# Patient Record
Sex: Male | Born: 1935 | Race: White | Hispanic: No | Smoking: Former smoker
Health system: Southern US, Community
[De-identification: ages and names within clinical notes are randomized; demographics above are authoritative.]

## PROBLEM LIST (undated history)

## (undated) DIAGNOSIS — D494 Neoplasm of unspecified behavior of bladder: Secondary | ICD-10-CM

## (undated) DIAGNOSIS — E785 Hyperlipidemia, unspecified: Secondary | ICD-10-CM

## (undated) DIAGNOSIS — Z8546 Personal history of malignant neoplasm of prostate: Secondary | ICD-10-CM

## (undated) DIAGNOSIS — R600 Localized edema: Secondary | ICD-10-CM

## (undated) DIAGNOSIS — Z860101 Personal history of adenomatous and serrated colon polyps: Secondary | ICD-10-CM

## (undated) DIAGNOSIS — Z86010 Personal history of colonic polyps: Secondary | ICD-10-CM

## (undated) DIAGNOSIS — N529 Male erectile dysfunction, unspecified: Secondary | ICD-10-CM

## (undated) DIAGNOSIS — R0600 Dyspnea, unspecified: Secondary | ICD-10-CM

## (undated) DIAGNOSIS — H3530 Unspecified macular degeneration: Secondary | ICD-10-CM

## (undated) DIAGNOSIS — R569 Unspecified convulsions: Secondary | ICD-10-CM

## (undated) DIAGNOSIS — Q2730 Arteriovenous malformation, site unspecified: Secondary | ICD-10-CM

## (undated) DIAGNOSIS — J439 Emphysema, unspecified: Secondary | ICD-10-CM

## (undated) DIAGNOSIS — I252 Old myocardial infarction: Secondary | ICD-10-CM

## (undated) DIAGNOSIS — Z955 Presence of coronary angioplasty implant and graft: Secondary | ICD-10-CM

## (undated) DIAGNOSIS — E559 Vitamin D deficiency, unspecified: Secondary | ICD-10-CM

## (undated) DIAGNOSIS — R0609 Other forms of dyspnea: Secondary | ICD-10-CM

## (undated) DIAGNOSIS — I10 Essential (primary) hypertension: Secondary | ICD-10-CM

## (undated) DIAGNOSIS — I440 Atrioventricular block, first degree: Secondary | ICD-10-CM

## (undated) DIAGNOSIS — K219 Gastro-esophageal reflux disease without esophagitis: Secondary | ICD-10-CM

## (undated) DIAGNOSIS — Z8551 Personal history of malignant neoplasm of bladder: Secondary | ICD-10-CM

## (undated) DIAGNOSIS — M1990 Unspecified osteoarthritis, unspecified site: Secondary | ICD-10-CM

## (undated) DIAGNOSIS — I2510 Atherosclerotic heart disease of native coronary artery without angina pectoris: Secondary | ICD-10-CM

## (undated) DIAGNOSIS — I441 Atrioventricular block, second degree: Secondary | ICD-10-CM

## (undated) DIAGNOSIS — Z950 Presence of cardiac pacemaker: Secondary | ICD-10-CM

## (undated) HISTORY — PX: CATARACT EXTRACTION W/ INTRAOCULAR LENS  IMPLANT, BILATERAL: SHX1307

## (undated) HISTORY — DX: Essential (primary) hypertension: I10

## (undated) HISTORY — DX: Arteriovenous malformation, site unspecified: Q27.30

## (undated) HISTORY — PX: BLEPHAROPLASTY: SUR158

## (undated) HISTORY — DX: Unspecified macular degeneration: H35.30

## (undated) HISTORY — DX: Unspecified convulsions: R56.9

## (undated) HISTORY — DX: Gastro-esophageal reflux disease without esophagitis: K21.9

## (undated) HISTORY — DX: Male erectile dysfunction, unspecified: N52.9

---

## 1978-09-19 HISTORY — PX: CRANIOTOMY: SHX93

## 1994-04-27 ENCOUNTER — Encounter: Payer: Self-pay | Admitting: Internal Medicine

## 1999-03-12 ENCOUNTER — Ambulatory Visit (HOSPITAL_COMMUNITY): Admission: RE | Admit: 1999-03-12 | Discharge: 1999-03-12 | Payer: Self-pay | Admitting: Gastroenterology

## 1999-03-12 ENCOUNTER — Encounter: Payer: Self-pay | Admitting: Gastroenterology

## 2003-07-22 ENCOUNTER — Encounter: Payer: Self-pay | Admitting: Internal Medicine

## 2003-07-22 ENCOUNTER — Ambulatory Visit (HOSPITAL_COMMUNITY): Admission: RE | Admit: 2003-07-22 | Discharge: 2003-07-22 | Payer: Self-pay | Admitting: Gastroenterology

## 2004-04-17 ENCOUNTER — Emergency Department (HOSPITAL_COMMUNITY): Admission: EM | Admit: 2004-04-17 | Discharge: 2004-04-17 | Payer: Self-pay | Admitting: Emergency Medicine

## 2007-01-01 ENCOUNTER — Ambulatory Visit: Payer: Self-pay | Admitting: Gastroenterology

## 2008-08-22 ENCOUNTER — Ambulatory Visit: Payer: Self-pay | Admitting: Internal Medicine

## 2008-08-22 DIAGNOSIS — Z860100 Personal history of colon polyps, unspecified: Secondary | ICD-10-CM | POA: Insufficient documentation

## 2008-08-22 DIAGNOSIS — K219 Gastro-esophageal reflux disease without esophagitis: Secondary | ICD-10-CM

## 2008-09-22 ENCOUNTER — Ambulatory Visit: Payer: Self-pay | Admitting: Internal Medicine

## 2009-09-19 HISTORY — PX: APPENDECTOMY: SHX54

## 2009-09-24 ENCOUNTER — Inpatient Hospital Stay (HOSPITAL_COMMUNITY): Admission: EM | Admit: 2009-09-24 | Discharge: 2009-09-25 | Payer: Self-pay | Admitting: Emergency Medicine

## 2009-09-24 ENCOUNTER — Encounter (INDEPENDENT_AMBULATORY_CARE_PROVIDER_SITE_OTHER): Payer: Self-pay | Admitting: General Surgery

## 2010-12-05 LAB — CBC
HCT: 44.5 % (ref 39.0–52.0)
Hemoglobin: 14.9 g/dL (ref 13.0–17.0)
RDW: 13.5 % (ref 11.5–15.5)

## 2010-12-05 LAB — DIFFERENTIAL
Basophils Absolute: 0 10*3/uL (ref 0.0–0.1)
Eosinophils Relative: 0 % (ref 0–5)
Lymphocytes Relative: 6 % — ABNORMAL LOW (ref 12–46)
Lymphs Abs: 0.8 10*3/uL (ref 0.7–4.0)
Monocytes Absolute: 0.7 10*3/uL (ref 0.1–1.0)

## 2010-12-05 LAB — BASIC METABOLIC PANEL
Chloride: 97 mEq/L (ref 96–112)
GFR calc non Af Amer: >60 mL/min (ref >60)
Glucose, Bld: 167 mg/dL — ABNORMAL HIGH (ref 70–99)
Potassium: 3.7 mEq/L (ref 3.5–5.1)
Sodium: 132 mEq/L — ABNORMAL LOW (ref 135–145)

## 2010-12-05 LAB — URINALYSIS, ROUTINE W REFLEX MICROSCOPIC
Glucose, UA: NEGATIVE mg/dL
Ketones, ur: NEGATIVE mg/dL
Leukocytes, UA: NEGATIVE
Nitrite: NEGATIVE
Protein, ur: 30 mg/dL — AB
Urobilinogen, UA: 0.2 mg/dL (ref 0.0–1.0)

## 2010-12-05 LAB — URINE MICROSCOPIC-ADD ON

## 2011-02-04 NOTE — Assessment & Plan Note (Signed)
[facility]                         GASTROENTEROLOGY OFFICE NOTE   FLEETWOOD, PIERRON                    MRN:          [id number]  DATE:01/01/2007                            DOB:          04-23-1932    Joshua [name] comes in because he is somewhat concerned about his GERD and  whether he needs a followup colonoscopic examination. He says he has a  little difficulty with dysphagia but nothing of great significance only  minimally with some large calcium pills, most consistent with  cricopharyngeal dysfunction. His last colonoscopic examination revealed  a 2-cm hiatal hernia, he was dilated with an 18-Savary dilator and he  had some mild gastritis. Colonoscopic examination done in 2004 as well  revealed only some mild diverticular disease and external hemorrhoids.  The patient presently is on dilantin, phenobarbital, Omeprazole, Plavix,  pravastatin, calcium and vitamin D.   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 He also had some sinus trouble.   FAMILY HISTORY:  Noncontributory except for a brother with diabetes.   SOCIAL HISTORY:  He drinks rare occasionally, nothing of significant.   REVIEW OF SYSTEMS:  Noncontributory.   PHYSICAL EXAMINATION:  GENERAL:  A healthy-appearing man, 6 feet 1,  weighs 203, blood pressure 122/64, pulse 78 and regular.  Neck, heart and extremities are all unremarkable.   IMPRESSION:  1. Gastroesophageal reflux disease with some mild dysphagia and some      cricopharyngeal dysphagia of probably no consequences and not      unusual for his age which I explained to him.  2. Status post colon polyps.  3. History of cranial arteriovenous malformations, TIAs.   RECOMMENDATIONS:  He is to stay on his Prilosec and I told him I really  did not think he needed an upper endoscopy since his dysphagia  was not  significant and I also did not think with his fairly recent colonoscopic  examination that he was not scheduled for another year and a half  and I really did not think he ought to do it any sooner unless there is  worsening symptomatology of more evidence of __________ .     Ulyess Mort, MD  Electronically Signed    SML/MedQ  DD: 01/01/2007  DT: 01/01/2007  Job #: [id number]   cc:   Ernestina Penna, M.D.

## 2011-04-21 ENCOUNTER — Other Ambulatory Visit: Payer: Self-pay | Admitting: Family Medicine

## 2011-04-21 DIAGNOSIS — R918 Other nonspecific abnormal finding of lung field: Secondary | ICD-10-CM

## 2011-04-22 ENCOUNTER — Ambulatory Visit
Admission: RE | Admit: 2011-04-22 | Discharge: 2011-04-22 | Disposition: A | Discharge status: Home / Self Care | Payer: Medicare Other | Source: Ambulatory Visit | Attending: Family Medicine | Admitting: Family Medicine

## 2011-04-22 DIAGNOSIS — R918 Other nonspecific abnormal finding of lung field: Secondary | ICD-10-CM

## 2011-04-22 MED ORDER — IOHEXOL 300 MG/ML  SOLN
75.0000 mL | Freq: Once | INTRAMUSCULAR | Status: AC | PRN
  Administered 2011-04-22: 75 mL via INTRAVENOUS

## 2011-04-28 ENCOUNTER — Ambulatory Visit
Admission: RE | Admit: 2011-04-28 | Discharge: 2011-04-28 | Disposition: A | Discharge status: Home / Self Care | Payer: Medicare Other | Source: Ambulatory Visit | Attending: Family Medicine | Admitting: Family Medicine

## 2011-04-28 ENCOUNTER — Other Ambulatory Visit: Payer: Self-pay | Admitting: Family Medicine

## 2011-04-28 DIAGNOSIS — K7689 Other specified diseases of liver: Secondary | ICD-10-CM

## 2011-04-28 MED ORDER — GADOBENATE DIMEGLUMINE 529 MG/ML IV SOLN: 18.0000 mL | Freq: Once | INTRAVENOUS | Status: AC | PRN

## 2011-09-27 DIAGNOSIS — R3915 Urgency of urination: Secondary | ICD-10-CM | POA: Diagnosis not present

## 2011-09-27 DIAGNOSIS — N138 Other obstructive and reflux uropathy: Secondary | ICD-10-CM | POA: Diagnosis not present

## 2011-10-21 DIAGNOSIS — R7989 Other specified abnormal findings of blood chemistry: Secondary | ICD-10-CM | POA: Diagnosis not present

## 2011-11-02 DIAGNOSIS — R339 Retention of urine, unspecified: Secondary | ICD-10-CM | POA: Diagnosis not present

## 2011-11-02 DIAGNOSIS — N138 Other obstructive and reflux uropathy: Secondary | ICD-10-CM | POA: Diagnosis not present

## 2011-11-02 DIAGNOSIS — N403 Nodular prostate with lower urinary tract symptoms: Secondary | ICD-10-CM | POA: Diagnosis not present

## 2011-11-03 DIAGNOSIS — R972 Elevated prostate specific antigen [PSA]: Secondary | ICD-10-CM | POA: Diagnosis not present

## 2011-11-17 DIAGNOSIS — R3915 Urgency of urination: Secondary | ICD-10-CM | POA: Diagnosis not present

## 2011-11-17 DIAGNOSIS — N138 Other obstructive and reflux uropathy: Secondary | ICD-10-CM | POA: Diagnosis not present

## 2011-11-17 DIAGNOSIS — R339 Retention of urine, unspecified: Secondary | ICD-10-CM | POA: Diagnosis not present

## 2011-11-17 DIAGNOSIS — C61 Malignant neoplasm of prostate: Secondary | ICD-10-CM | POA: Diagnosis not present

## 2011-11-17 DIAGNOSIS — N403 Nodular prostate with lower urinary tract symptoms: Secondary | ICD-10-CM | POA: Diagnosis not present

## 2011-11-22 ENCOUNTER — Encounter: Payer: Self-pay | Admitting: *Deleted

## 2011-11-22 DIAGNOSIS — R569 Unspecified convulsions: Secondary | ICD-10-CM | POA: Insufficient documentation

## 2011-11-22 DIAGNOSIS — E7800 Pure hypercholesterolemia, unspecified: Secondary | ICD-10-CM | POA: Insufficient documentation

## 2011-11-22 DIAGNOSIS — I10 Essential (primary) hypertension: Secondary | ICD-10-CM | POA: Insufficient documentation

## 2011-11-22 DIAGNOSIS — C61 Malignant neoplasm of prostate: Secondary | ICD-10-CM | POA: Insufficient documentation

## 2011-11-22 NOTE — Progress Notes (Signed)
Path:11/02/2011 : Prostate bxs=Adenocarcinoma,Gleason =3+3=6 & 3+4=7,PSA=1.58.Volume=19cc   Widowed,2 children, [employer]   Allergies: PCN    Hx: Incomplete emptying of Bladder, and Urgency Interested in [employer] implant

## 2011-11-23 ENCOUNTER — Ambulatory Visit
Admission: RE | Admit: 2011-11-23 | Discharge: 2011-11-23 | Disposition: A | Discharge status: Home / Self Care | Payer: Medicare Other | Source: Ambulatory Visit | Attending: Radiation Oncology | Admitting: Radiation Oncology

## 2011-11-23 ENCOUNTER — Encounter: Payer: Self-pay | Admitting: Radiation Oncology

## 2011-11-23 VITALS — BP 149/74 | HR 71 | Temp 98.1°F | Resp 20 | Ht 72.0 in | Wt 194.3 lb

## 2011-11-23 DIAGNOSIS — C61 Malignant neoplasm of prostate: Secondary | ICD-10-CM

## 2011-11-23 DIAGNOSIS — Z79899 Other long term (current) drug therapy: Secondary | ICD-10-CM | POA: Diagnosis not present

## 2011-11-23 DIAGNOSIS — Z87891 Personal history of nicotine dependence: Secondary | ICD-10-CM | POA: Diagnosis not present

## 2011-11-23 DIAGNOSIS — I10 Essential (primary) hypertension: Secondary | ICD-10-CM | POA: Insufficient documentation

## 2011-11-23 DIAGNOSIS — E7800 Pure hypercholesterolemia, unspecified: Secondary | ICD-10-CM | POA: Insufficient documentation

## 2011-11-23 DIAGNOSIS — N529 Male erectile dysfunction, unspecified: Secondary | ICD-10-CM | POA: Insufficient documentation

## 2011-11-23 DIAGNOSIS — K219 Gastro-esophageal reflux disease without esophagitis: Secondary | ICD-10-CM | POA: Diagnosis not present

## 2011-11-23 NOTE — Progress Notes (Signed)
[facility] Radiation Oncology Review Of Systems  Name: Joshua [name] MRN: [id number]  Date: 11/23/2011  DOB: 25-Feb-1932  Status:outpatient   Drug Allergies:  Allergies  Allergen Reactions  . Penicillins     Symptoms since last visit at this office:  CONSTITUTIONAL: None  EYES:Glasses  EARS/NOSE/THROAT: Hearing Loss  HEART:None  LUNG: None  STOMACH/BOWEL: None  GENITOURINARY: None  BREASTS: None  SKIN: Dryness  MUSCLE/BONES: Back Pain  NERVOUS SYSTEM:Vertigo and seizure   MENTAL HEALTH:None  HORMONES/REPRODUCTIVE: None  BLOOD/LYMPH SYSTEM: None  IMMUNE: None  ADDITIONAL INFORMATION/CONCERNS: [facility] Radiation Oncology Review Of Systems  Name: Joshua [name] MRN: [id number]  Date: 11/23/2011  DOB: [birth year]  Status:outpatient   Drug Allergies:  Allergies  Allergen Reactions  . Penicillins     Symptoms since last visit at this office:  CONSTITUTIONAL: None  EYES:Glasses  EARS/NOSE/THROAT: Hearing Loss  HEART:None  LUNG: None  STOMACH/BOWEL: None and Reflux  GENITOURINARY: None  BREASTS: None  SKIN: None  MUSCLE/BONES: Back Pain  NERVOUS SYSTEM:Vertigo,History  MENTAL HEALTH:None  HORMONES/REPRODUCTIVE: None  BLOOD/LYMPH SYSTEM: None  IMMUNE: None  ADDITIONAL INFORMATION/CONCERNS: [facility] Radiation Oncology Review Of Systems  Name: Joshua [name] MRN: [id number]  Date: 11/23/2011  DOB: 28-Jul-1932  Status:outpatient   Drug Allergies:  Allergies  Allergen Reactions  . Penicillins     Symptoms since last visit at this office:  CONSTITUTIONAL: None  EYES:Glasses  EARS/NOSE/THROAT: Hearing Loss,  HEART:None and Feet Swelling  LUNG: None and pneumonia this summer  STOMACH/BOWEL: Reflux  GENITOURINARY: None and bladder doesn't completely empties  BREASTS: None  SKIN: None and Dryness  MUSCLE/BONES: None and Back Pain  NERVOUS  SYSTEM:Seizure  MENTAL HEALTH:None  HORMONES/REPRODUCTIVE: None  BLOOD/LYMPH SYSTEM: None  IMMUNE: None  ADDITIONAL INFORMATION/CONCERNS:Please see the Nurse Progress Note in the MD Initial Consult Encounter for this patient.

## 2011-11-23 NOTE — Progress Notes (Signed)
Radiation Oncology         [phone]) [phone] ________________________________  Initial outpatient Consultation  Name: Joshua [name] MRN: [id number]  Date: 11/23/2011  DOB: June 27, 1932  CC:No primary provider on file.  [name], MD   REFERRING PHYSICIAN: [name], MD  DIAGNOSIS: 78 yo gentleman with stage T2a adenocarcinoma of the prostate with a Gleason's score of 3+4 and a PSA of 1.58  HISTORY OF PRESENT ILLNESS::Joshua [name] is a 78 y.o. male who was found to have a palpable nodule of the left prostate by his primary care doctor.  His PSA was normal 1.58.  He was referred to Dr. Annabell Howells on 09/27/11.  Dr. Annabell Howells performed a digital rectal exam at that time revealed an 8 mm nodule in the prostate. The patient underwent transrectal ultrasound with 12 biopsies on 2/13.  His volume was 19 cc.  Adenocarcinoma was seen in 3/12 core samples with a maximum Gleason's score of 3+4 occupying 5% of the left lateral base and left base specimens.  Gleason's 3+3 was seen in 5% of the right mid specimen.  He reviewed the biopsy results with Dr. Annabell Howells and has kindly been referred to discuss possible radiation options.  PREVIOUS RADIATION THERAPY: No  PAST MEDICAL HISTORY:  has a past medical history of Prostate cancer (11/02/11 bxs); Allergy; GERD (gastroesophageal reflux disease); Hypercholesterolemia; Hypertension; Seizures; Arteriovenous malformation; and ED (erectile dysfunction).    PAST SURGICAL HISTORY: Past Surgical History  Procedure Date  . Appendectomy   . Craniotomy     FAMILY HISTORY: family history includes Emphysema in his father and mother.  SOCIAL HISTORY:  reports that he has quit smoking. He does not have any smokeless tobacco history on file. He reports that he drinks alcohol. He reports that he does not use illicit drugs.  ALLERGIES: Penicillins  MEDICATIONS:  Current Outpatient Prescriptions  Medication Sig Dispense Refill  . Calcium 500 MG CHEW Chew 500 mg by mouth  daily.      . clopidogrel (PLAVIX) 75 MG tablet Take 75 mg by mouth daily.      Marland Kitchen omeprazole (PRILOSEC) 20 MG capsule Take 20 mg by mouth daily.      Marland Kitchen PHENobarbital (LUMINAL) 32.4 MG tablet Take 32.4 mg by mouth 2 (two) times daily.      . phenytoin (DILANTIN) 100 MG ER capsule Take by mouth 3 (three) times daily.      . pravastatin (PRAVACHOL) 80 MG tablet Take 80 mg by mouth daily.      Marland Kitchen VITAMIN D, ERGOCALCIFEROL, PO Take by mouth.        REVIEW OF SYSTEMS:  A 15 point review of systems is documented in the electronic medical record. This was obtained by the nursing staff. However, I reviewed this with the patient to discuss relevant findings and make appropriate changes.  Pertinent items are noted in HPI.  He filled out an IPSS questionaire today His score was 7 suggesting mild outflow obstructive symptoms.  He filled out an erectile function questionairre today, but indicated that he has not been sexually active since the passing of his wife, and this is not a priority for him.   PHYSICAL EXAM:  vitals were not taken for this visit.  The patient is in no acute distress.  Detailed exam was deferred today.  Please see the rectal exam findings described above.  LABORATORY DATA:  Lab Results  Component Value Date   WBC 13.0* 09/23/2009   HGB 14.9 09/23/2009  HCT 44.5 09/23/2009   MCV 88.3 09/23/2009   PLT 272 09/23/2009   Lab Results  Component Value Date   NA 132* 09/23/2009   K 3.7 09/23/2009   CL 97 09/23/2009   CO2 25 09/23/2009     RADIOGRAPHY: No results found.    IMPRESSION: This very nice gentleman has stage TIIA adenocarcinoma of the prostate with Gleason score 3+4 and PSA of 1.58. He falls into an intermediate risk group in terms of his Gleason score. However, he does have a relatively small volume of disease in his eligible for a variety of potential treatment options. He is not ideally suited for active surveillance given his intermediate risk Gleason score. He would potentially be  eligible for radical prostatectomy, external beam radiation treatment, or possibly prostate seed implant as monotherapy.  PLAN:  Today I reviewed with Joshua [name] the findings and workup thus far.  We discussed the natural history of prostate cancer.  We reviewed the the implications of T-stage, Gleason's Score, and PSA on decision-making and outcomes in prostate cancer.  We discussed radiation treatment in the management of prostate cancer with regard to the logistics and delivery of external beam radiation treatment as well as the logistics and delivery of prostate brachytherapy.  We compared and contrasted each of these approaches and also compared these against prostatectomy.  The patient expressed interest in prostate brachytherapy.  I filled out a patient counseling form for him with relevant treatment diagrams and we retained a copy for our records.   The patient would like to proceed with prostate brachytherapy.  I will move forward with scheduling the procedure in the near future.     I enjoyed meeting Joshua [name] today.  I will look forward to participating in the care of this very nice gentleman.  I spent 60 minutes minutes face to face with the patient and more than 50% of that time was spent in counseling and/or coordination of care.   ------------------------------------------------  Artist Pais. Kathrynn Running, M.D.

## 2011-11-25 ENCOUNTER — Telehealth: Payer: Self-pay | Admitting: *Deleted

## 2011-11-25 NOTE — Telephone Encounter (Signed)
XXXX 

## 2011-11-28 ENCOUNTER — Telehealth: Payer: Self-pay | Admitting: *Deleted

## 2011-11-28 NOTE — Telephone Encounter (Signed)
XXXX 

## 2011-12-01 ENCOUNTER — Telehealth: Payer: Self-pay | Admitting: *Deleted

## 2011-12-01 NOTE — Telephone Encounter (Signed)
XXXX 

## 2011-12-02 ENCOUNTER — Ambulatory Visit
Admission: RE | Admit: 2011-12-02 | Discharge: 2011-12-02 | Disposition: A | Discharge status: Home / Self Care | Payer: Medicare Other | Source: Ambulatory Visit | Attending: Radiation Oncology | Admitting: Radiation Oncology

## 2011-12-02 DIAGNOSIS — N529 Male erectile dysfunction, unspecified: Secondary | ICD-10-CM | POA: Diagnosis not present

## 2011-12-02 DIAGNOSIS — E7800 Pure hypercholesterolemia, unspecified: Secondary | ICD-10-CM | POA: Diagnosis not present

## 2011-12-02 DIAGNOSIS — C61 Malignant neoplasm of prostate: Secondary | ICD-10-CM | POA: Diagnosis not present

## 2011-12-02 DIAGNOSIS — I10 Essential (primary) hypertension: Secondary | ICD-10-CM | POA: Diagnosis not present

## 2011-12-02 DIAGNOSIS — K219 Gastro-esophageal reflux disease without esophagitis: Secondary | ICD-10-CM | POA: Diagnosis not present

## 2011-12-02 DIAGNOSIS — Z87891 Personal history of nicotine dependence: Secondary | ICD-10-CM | POA: Diagnosis not present

## 2011-12-05 ENCOUNTER — Other Ambulatory Visit: Payer: Self-pay | Admitting: Urology

## 2011-12-05 ENCOUNTER — Encounter: Payer: Self-pay | Admitting: Radiation Oncology

## 2011-12-05 NOTE — Progress Notes (Signed)
  Radiation Oncology         [phone]) [phone] ________________________________  Name: [name] MRN: [id number]  Date: 12/05/2011  DOB: 07-Dec-1931  SIMULATION AND TREATMENT PLANNING NOTE PUBIC ARCH STUDY  CC:No primary provider on file.  [name], MD  DIAGNOSIS: 78 year old gentlemen with stage T2a adenocarcinoma of the prostate with a Gleason of 3+4 and a PSA of 1.58.  NARRATIVE:  The patient presented today for evaluation for possible prostate seed implant. He was brought to the radiation planning suite and placed supine on the CT couch. A 3-dimensional image study set was obtained in upload to the planning computer. There, on each axial slice, I contoured the prostate gland. Then, using three-dimensional radiation planning tools I reconstructed the prostate in view of the structures from the transperineal needle pathway to assess for possible pubic arch interference. In doing so, I did not appreciate any pubic arch interference. Also, the patient's prostate volume was estimated based on the drawn structure. The volume was 30 cc.  Given the pubic arch appearance and prostate volume, patient remains a good candidate to proceed with prostate seed implant. Today, he freely provided informed written consent to proceed.    PLAN: The patient will undergo prostate seed implant to 145 Gy.   ________________________________  Artist Pais. Kathrynn Running, M.D.

## 2011-12-06 ENCOUNTER — Telehealth: Payer: Self-pay | Admitting: *Deleted

## 2011-12-06 NOTE — Telephone Encounter (Signed)
XXXX 

## 2011-12-07 ENCOUNTER — Other Ambulatory Visit: Payer: Self-pay | Admitting: Dermatology

## 2011-12-07 DIAGNOSIS — C44621 Squamous cell carcinoma of skin of unspecified upper limb, including shoulder: Secondary | ICD-10-CM | POA: Diagnosis not present

## 2011-12-12 DIAGNOSIS — H16109 Unspecified superficial keratitis, unspecified eye: Secondary | ICD-10-CM | POA: Diagnosis not present

## 2011-12-12 DIAGNOSIS — H259 Unspecified age-related cataract: Secondary | ICD-10-CM | POA: Diagnosis not present

## 2011-12-12 DIAGNOSIS — H04129 Dry eye syndrome of unspecified lacrimal gland: Secondary | ICD-10-CM | POA: Diagnosis not present

## 2011-12-12 DIAGNOSIS — H31009 Unspecified chorioretinal scars, unspecified eye: Secondary | ICD-10-CM | POA: Diagnosis not present

## 2012-01-11 ENCOUNTER — Telehealth: Payer: Self-pay | Admitting: *Deleted

## 2012-01-11 DIAGNOSIS — Z85828 Personal history of other malignant neoplasm of skin: Secondary | ICD-10-CM | POA: Diagnosis not present

## 2012-01-11 DIAGNOSIS — L570 Actinic keratosis: Secondary | ICD-10-CM | POA: Diagnosis not present

## 2012-01-11 NOTE — Telephone Encounter (Signed)
CALLED PATIENT TO REMIND OF APPT., SPOKE W/PATIENT AND HE IS AWARE OF THIS APPT.

## 2012-01-12 DIAGNOSIS — E559 Vitamin D deficiency, unspecified: Secondary | ICD-10-CM | POA: Diagnosis not present

## 2012-01-12 DIAGNOSIS — E785 Hyperlipidemia, unspecified: Secondary | ICD-10-CM | POA: Diagnosis not present

## 2012-01-12 DIAGNOSIS — I10 Essential (primary) hypertension: Secondary | ICD-10-CM | POA: Diagnosis not present

## 2012-01-12 DIAGNOSIS — Z125 Encounter for screening for malignant neoplasm of prostate: Secondary | ICD-10-CM | POA: Diagnosis not present

## 2012-01-16 ENCOUNTER — Encounter (HOSPITAL_BASED_OUTPATIENT_CLINIC_OR_DEPARTMENT_OTHER): Payer: Self-pay | Admitting: *Deleted

## 2012-01-16 NOTE — Progress Notes (Addendum)
To [facility] at 0615,Npo after mn. fleets enema that morning,to take dilantin,luminal,prilisec with sip water only also.Ekg,Cxr w/chart ,labs to be faxed to [facility] from [facility] in [place] knows to forward them to [place].  RECEIVED LAB RESULTS. PT WILL NEED CBC AND PT/'PTT DONE ON ARRIVAL AT 0600.  SPOKE W/ SHIRLEY AT DR MANNING OFFICE SHE WILL INFORM HIM AND LET KNOW IF PT NEEDS TO HAVE DONE BEFORE DOS. ALSO, LM W/ PAM DR [facility] OFFICE ABOUT THE SAME ISSUE. LM FOR PT ON HOME PHONE ,TO ARRIVE AT 0600.

## 2012-01-18 ENCOUNTER — Telehealth: Payer: Self-pay | Admitting: *Deleted

## 2012-01-18 NOTE — H&P (Signed)
ive Problems Problems  1. Feelings Of Urinary Urgency 788.63 2. Incomplete Emptying Of Bladder 788.21 3. Prostate Cancer 185 4. Prostate Hard Area Or Nodule With Postvoiding Residual Urine 600.11  History of Present Illness  Joshua [name] returns today in f/u from his prostate biopsy biopsy that showed a T2aNxMx gleason 7 prostate cancer.  The two left base cores had gleason 7(3+4) disease in the area of his nodule and there was a single core with gleason 6 on the right.   He has an IPSS of 12 and a SHIM of 13.   Past Medical History Problems  1. History of  Arteriovenous Malformation (CNS) 747.81 2. History of  Esophageal Reflux 530.81 3. History of  Hypercholesterolemia 272.0 4. History of  Hypertension 401.9 5. History of  Seizure  Surgical History Problems  1. History of  Appendectomy 2. History of  Craniotomy (Therapeutic)  Current Meds 1. Calcium 500 TABS; Therapy: (Recorded:08Jan2013) to 2. Dilantin 100 MG Oral Capsule; Therapy: (Recorded:08Jan2013) to 3. Levofloxacin 500 MG Oral Tablet; Take one tablet daily starting day before procedure; Therapy:  08Jan2013 to (Evaluate:11Jan2013)  Requested for: 08Jan2013; Last Rx:08Jan2013 4. Omeprazole 20 MG Oral Capsule Delayed Release; Therapy: (Recorded:08Jan2013) to 5. PHENobarbital 32.4 MG Oral Tablet; Therapy: (Recorded:08Jan2013) to 6. Plavix 75 MG Oral Tablet; Therapy: (Recorded:08Jan2013) to 7. Pravastatin Sodium 80 MG Oral Tablet; Therapy: (Recorded:08Jan2013) to 8. Vitamin D TABS; Therapy: (Recorded:08Jan2013) to  Allergies Medication  1. Penicillins  Family History Problems  1. Family history of  Death In The Family Father age 50 of emphysema 2. Family history of  Death In The Family Mother age 7 of stroke 3. Paternal history of  Emphysema 4. Family history of  Family Health Status Number Of Children 1 son  1 daughter 5. Maternal history of  Stroke Syndrome V17.1  Social History Problems  1. Alcohol Use 1  drink q 2-3 weeks 2. Caffeine Use 3 a day 3. Former Smoker V15.82 quit 40 years ago. 4. Marital History - Widowed 5. Occupation: Pharmacist, community. [employer] 6. Tobacco Use 305.1 1-2 ppd x 15 years    Path report reviewed and noted above.  Flow Rate: Voided 400 ml. A peak flow rate of 24ml/s . The tracing didn't print fully.  IPSS: The IPSS today is 12   SHIM: The SHIM score today is 13 .    Assessment Assessed  1. Prostate Cancer 185 2. Prostate Hard Area Or Nodule With Postvoiding Residual Urine 600.11 3. Feelings Of Urinary Urgency 788.63      He has T2a Gleason 7 NxMx prostate cancer with mild/mod voiding complaints and a history of urgency.  He has mild/moderate voiding symptoms with a prior elevated PVR but adequate flow. He has ED but is not interested in evaluation or treatment and is not worried about the impact of prostate cancer therapy on his erections.   Plan Prostate Cancer (185)  1. Radiation Oncology Referral Referral  Referral  Requested for: 28Feb2013 2. Follow-up PRN Office  Follow-up  Done: 28Feb2013   I reviewed the treatment options with him. I don't believe he is a good candidate for a prostatectomy and he is not interested in surveillance. I believe his best options are radiation therapy with either EXRT or seed monotherapy if he is felt to be a candidate for that and possibly cryoablation since he is not interested in preserving sexual function. I reviewed the risks of those procedures as noted below. I will set him up to see Dr.  Kathrynn Running since he may be a candidate for seed monotherapy.   Discussion/Summary  The patient was counseled about the natural history of prostate cancer and the standard treatment options that are available for prostate cancer. It was explained to him how his age and life expectancy, clinical stage, Gleason score, and PSA affect his prognosis, the decision to proceed with additional staging studies, as well as how that  information influences recommended treatment strategies. We discussed the roles for active surveillance, radiation therapy, surgical therapy, androgen deprivation, as well as ablative therapy options for the treatment of prostate cancer as appropriate to his individual cancer situation. We discussed the risks and benefits of these options with regard to their impact on cancer control and also in terms of potential adverse events, complications, and impact on quiality of life particularly related to urinary, bowel, and sexual function. The patient was encouraged to ask questions throughout the discussion today and all questions were answered to his stated satisfaction. In addition, the patient was provided with and/or directed to appropriate resources and literature for further education about prostate cancer and treatment options.  A total of 45 minutes were spent in the overall care of the patient today with 25 minutes in direct face to face consultation.   Physical exam.  Vitals Vital Signs [Data Includes: Last 1 Day]  08Jan2013 04:49PM  Blood Pressure: 130 / 60 Temperature: 97.2 F Heart Rate: 64  Physical Exam Constitutional: Well nourished and well developed . No acute distress.  ENT:. The ears and nose are normal in appearance.  Neck: The appearance of the neck is normal and no neck mass is present.  Pulmonary: No respiratory distress and normal respiratory rhythm and effort.  Cardiovascular: Heart rate and rhythm are normal . No peripheral edema.  Abdomen: The abdomen is soft and nontender. No masses are palpated. No CVA tenderness. No hernias are palpable. No hepatosplenomegaly noted.  Rectal: Rectal exam demonstrates normal sphincter tone, no tenderness and no masses. Estimated prostate size is 2+. The prostate has a palpable nodule (8mm gritty hard) involving the left, base of the prostate and is not tender. The left seminal vesicle is nonpalpable. The right seminal vesicle is  nonpalpable. The perineum is normal on inspection.  Genitourinary: Examination of the penis demonstrates no discharge, no masses, no lesions and a normal meatus. The scrotum is without lesions. The right epididymis is palpably normal and non-tender. The left epididymis is palpably normal and non-tender. The right testis is non-tender and without masses. The left testis is non-tender and without masses.  Lymphatics: The femoral and inguinal nodes are not enlarged or tender.  Skin: Normal skin turgor, no visible rash and no visible skin lesions.  Neuro/Psych:. Mood and affect are appropriate

## 2012-01-18 NOTE — Telephone Encounter (Signed)
Called patient to remind of procedure for 01-19-12, lvm for a return call

## 2012-01-18 NOTE — Anesthesia Preprocedure Evaluation (Addendum)
Anesthesia Evaluation  Patient identified by MRN, date of birth, ID band Patient awake    Reviewed: Allergy & Precautions, H&P , NPO status , Patient's Chart, lab work & pertinent test results  History of Anesthesia Complications Negative for: history of anesthetic complications  Airway Mallampati: II TM Distance: >3 FB   Mouth opening: Limited Mouth Opening  Dental  (+) Teeth Intact and Dental Advisory Given   Pulmonary former smoker breath sounds clear to auscultation        Cardiovascular hypertension, Rhythm:Regular Rate:Normal     Neuro/Psych Seizures -,  PSYCHIATRIC DISORDERS Craniotomy for AVM in 2008 negative psych ROS   GI/Hepatic Neg liver ROS, GERD-  Medicated,  Endo/Other  negative endocrine ROS  Renal/GU negative Renal ROS  negative genitourinary   Musculoskeletal negative musculoskeletal ROS (+)   Abdominal   Peds negative pediatric ROS (+)  Hematology negative hematology ROS (+)   Anesthesia Other Findings   Reproductive/Obstetrics negative OB ROS                          Anesthesia Physical Anesthesia Plan  ASA: II  Anesthesia Plan: General   Post-op Pain Management:    Induction: Intravenous  Airway Management Planned: LMA  Additional Equipment:   Intra-op Plan:   Post-operative Plan: Extubation in OR  Informed Consent: I have reviewed the patients History and Physical, chart, labs and discussed the procedure including the risks, benefits and alternatives for the proposed anesthesia with the patient or authorized representative who has indicated his/her understanding and acceptance.   Dental advisory given  Plan Discussed with: CRNA  Anesthesia Plan Comments:         Anesthesia Quick Evaluation

## 2012-01-19 ENCOUNTER — Encounter (HOSPITAL_BASED_OUTPATIENT_CLINIC_OR_DEPARTMENT_OTHER): Payer: Self-pay | Admitting: *Deleted

## 2012-01-19 ENCOUNTER — Encounter (HOSPITAL_BASED_OUTPATIENT_CLINIC_OR_DEPARTMENT_OTHER)
Admission: RE | Disposition: A | Discharge status: Home / Self Care | Payer: Self-pay | Source: Ambulatory Visit | Attending: Urology

## 2012-01-19 ENCOUNTER — Ambulatory Visit (HOSPITAL_COMMUNITY): Payer: Medicare Other

## 2012-01-19 ENCOUNTER — Encounter (HOSPITAL_BASED_OUTPATIENT_CLINIC_OR_DEPARTMENT_OTHER): Payer: Self-pay | Admitting: Anesthesiology

## 2012-01-19 ENCOUNTER — Ambulatory Visit (HOSPITAL_BASED_OUTPATIENT_CLINIC_OR_DEPARTMENT_OTHER): Payer: Medicare Other | Admitting: Anesthesiology

## 2012-01-19 ENCOUNTER — Ambulatory Visit (HOSPITAL_BASED_OUTPATIENT_CLINIC_OR_DEPARTMENT_OTHER)
Admission: RE | Admit: 2012-01-19 | Discharge: 2012-01-19 | Disposition: A | Discharge status: Home / Self Care | Payer: Medicare Other | Source: Ambulatory Visit | Attending: Urology | Admitting: Urology

## 2012-01-19 DIAGNOSIS — I10 Essential (primary) hypertension: Secondary | ICD-10-CM | POA: Diagnosis not present

## 2012-01-19 DIAGNOSIS — K219 Gastro-esophageal reflux disease without esophagitis: Secondary | ICD-10-CM | POA: Diagnosis not present

## 2012-01-19 DIAGNOSIS — Z7902 Long term (current) use of antithrombotics/antiplatelets: Secondary | ICD-10-CM | POA: Diagnosis not present

## 2012-01-19 DIAGNOSIS — Z79899 Other long term (current) drug therapy: Secondary | ICD-10-CM | POA: Diagnosis not present

## 2012-01-19 DIAGNOSIS — C61 Malignant neoplasm of prostate: Secondary | ICD-10-CM | POA: Insufficient documentation

## 2012-01-19 DIAGNOSIS — R339 Retention of urine, unspecified: Secondary | ICD-10-CM | POA: Diagnosis not present

## 2012-01-19 DIAGNOSIS — E7800 Pure hypercholesterolemia, unspecified: Secondary | ICD-10-CM | POA: Insufficient documentation

## 2012-01-19 HISTORY — PX: RADIOACTIVE SEED IMPLANT: SHX5150

## 2012-01-19 HISTORY — PX: CYSTOSCOPY: SHX5120

## 2012-01-19 LAB — APTT: aPTT: 30 seconds (ref 24–37)

## 2012-01-19 LAB — COMPREHENSIVE METABOLIC PANEL
ALT: 17 U/L (ref 0–53)
AST: 21 U/L (ref 0–37)
Albumin: 3.8 g/dL (ref 3.5–5.2)
Alkaline Phosphatase: 120 U/L — ABNORMAL HIGH (ref 39–117)
BUN: 16 mg/dL (ref 6–23)
Chloride: 104 mEq/L (ref 96–112)
Potassium: 3.9 mEq/L (ref 3.5–5.1)
Sodium: 141 mEq/L (ref 135–145)
Total Bilirubin: 0.2 mg/dL — ABNORMAL LOW (ref 0.3–1.2)
Total Protein: 6.9 g/dL (ref 6.0–8.3)

## 2012-01-19 LAB — CBC
HCT: 40.3 % (ref 39.0–52.0)
MCHC: 34.5 g/dL (ref 30.0–36.0)
Platelets: 247 10*3/uL (ref 150–400)
RDW: 13.2 % (ref 11.5–15.5)
WBC: 8.5 10*3/uL (ref 4.0–10.5)

## 2012-01-19 LAB — PROTIME-INR: Prothrombin Time: 13.2 seconds (ref 11.6–15.2)

## 2012-01-19 SURGERY — INSERTION, RADIATION SOURCE, PROSTATE
Anesthesia: General | Site: Prostate | Wound class: Clean Contaminated

## 2012-01-19 MED ORDER — FLEET ENEMA 7-19 GM/118ML RE ENEM: 1.0000 | ENEMA | Freq: Once | RECTAL | Status: DC

## 2012-01-19 MED ORDER — SODIUM CHLORIDE 0.9 % IJ SOLN: 3.0000 mL | INTRAMUSCULAR | Status: DC | PRN

## 2012-01-19 MED ORDER — LACTATED RINGERS IV SOLN
INTRAVENOUS | Status: DC
  Administered 2012-01-19 (×2): via INTRAVENOUS

## 2012-01-19 MED ORDER — SODIUM CHLORIDE 0.9 % IJ SOLN: 3.0000 mL | Freq: Two times a day (BID) | INTRAMUSCULAR | Status: DC

## 2012-01-19 MED ORDER — OXYCODONE HCL 5 MG PO TABS: 5.0000 mg | ORAL_TABLET | ORAL | Status: DC | PRN

## 2012-01-19 MED ORDER — ONDANSETRON HCL 4 MG/2ML IJ SOLN: 4.0000 mg | Freq: Four times a day (QID) | INTRAMUSCULAR | Status: DC | PRN

## 2012-01-19 MED ORDER — ACETAMINOPHEN 650 MG RE SUPP: 650.0000 mg | RECTAL | Status: DC | PRN

## 2012-01-19 MED ORDER — FENTANYL CITRATE 0.05 MG/ML IJ SOLN: 25.0000 ug | INTRAMUSCULAR | Status: DC | PRN

## 2012-01-19 MED ORDER — SODIUM CHLORIDE 0.9 % IV SOLN: 250.0000 mL | INTRAVENOUS | Status: DC | PRN

## 2012-01-19 MED ORDER — STERILE WATER FOR IRRIGATION IR SOLN
Status: DC | PRN
  Administered 2012-01-19: 3000 mL

## 2012-01-19 MED ORDER — LACTATED RINGERS IV SOLN: INTRAVENOUS | Status: DC

## 2012-01-19 MED ORDER — HYDROCODONE-ACETAMINOPHEN 5-325 MG PO TABS
1.0000 | ORAL_TABLET | Freq: Four times a day (QID) | ORAL | Status: DC | PRN
  Administered 2012-01-19: 1 via ORAL

## 2012-01-19 MED ORDER — PROMETHAZINE HCL 25 MG/ML IJ SOLN: 6.2500 mg | INTRAMUSCULAR | Status: DC | PRN

## 2012-01-19 MED ORDER — PROPOFOL 10 MG/ML IV EMUL
INTRAVENOUS | Status: DC | PRN
  Administered 2012-01-19: 160 mg via INTRAVENOUS
  Administered 2012-01-19: 40 mg via INTRAVENOUS

## 2012-01-19 MED ORDER — STERILE WATER FOR IRRIGATION IR SOLN
Status: DC | PRN
  Administered 2012-01-19: 500 mL

## 2012-01-19 MED ORDER — HYDROCODONE-ACETAMINOPHEN 5-325 MG PO TABS: 1.0000 | ORAL_TABLET | Freq: Four times a day (QID) | ORAL | Status: AC | PRN

## 2012-01-19 MED ORDER — CIPROFLOXACIN HCL 250 MG PO TABS: 250.0000 mg | ORAL_TABLET | Freq: Two times a day (BID) | ORAL | Status: AC

## 2012-01-19 MED ORDER — LIDOCAINE HCL (CARDIAC) 20 MG/ML IV SOLN
INTRAVENOUS | Status: DC | PRN
  Administered 2012-01-19: 60 mg via INTRAVENOUS

## 2012-01-19 MED ORDER — ONDANSETRON HCL 4 MG/2ML IJ SOLN
INTRAMUSCULAR | Status: DC | PRN
  Administered 2012-01-19: 4 mg via INTRAVENOUS

## 2012-01-19 MED ORDER — ACETAMINOPHEN 325 MG PO TABS: 650.0000 mg | ORAL_TABLET | ORAL | Status: DC | PRN

## 2012-01-19 MED ORDER — IOHEXOL 350 MG/ML SOLN
INTRAVENOUS | Status: DC | PRN
  Administered 2012-01-19: 7 mL via INTRAVENOUS

## 2012-01-19 MED ORDER — DEXAMETHASONE SODIUM PHOSPHATE 4 MG/ML IJ SOLN
INTRAMUSCULAR | Status: DC | PRN
  Administered 2012-01-19: 10 mg via INTRAVENOUS

## 2012-01-19 MED ORDER — CIPROFLOXACIN IN D5W 400 MG/200ML IV SOLN
400.0000 mg | INTRAVENOUS | Status: AC
  Administered 2012-01-19: 400 mg via INTRAVENOUS

## 2012-01-19 MED ORDER — GLYCOPYRROLATE 0.2 MG/ML IJ SOLN
INTRAMUSCULAR | Status: DC | PRN
  Administered 2012-01-19: 0.2 mg via INTRAVENOUS

## 2012-01-19 MED ORDER — FENTANYL CITRATE 0.05 MG/ML IJ SOLN
INTRAMUSCULAR | Status: DC | PRN
  Administered 2012-01-19 (×4): 50 ug via INTRAVENOUS

## 2012-01-19 SURGICAL SUPPLY — 28 items
BAG URINE DRAINAGE (UROLOGICAL SUPPLIES) ×3 IMPLANT
BLADE SURG ROTATE 9660 (MISCELLANEOUS) ×3 IMPLANT
CATH FOLEY 2WAY SLVR  5CC 16FR (CATHETERS) ×2
CATH FOLEY 2WAY SLVR 5CC 16FR (CATHETERS) ×4 IMPLANT
CATH ROBINSON RED A/P 20FR (CATHETERS) ×3 IMPLANT
CLOTH BEACON ORANGE TIMEOUT ST (SAFETY) ×3 IMPLANT
COVER MAYO STAND STRL (DRAPES) ×3 IMPLANT
COVER TABLE BACK 60X90 (DRAPES) ×3 IMPLANT
DRSG TEGADERM 4X4.75 (GAUZE/BANDAGES/DRESSINGS) ×3 IMPLANT
DRSG TEGADERM 8X12 (GAUZE/BANDAGES/DRESSINGS) ×3 IMPLANT
GLOVE BIO SURGEON STRL SZ7.5 (GLOVE) IMPLANT
GLOVE BIOGEL PI IND STRL 6.5 (GLOVE) ×2 IMPLANT
GLOVE BIOGEL PI INDICATOR 6.5 (GLOVE) ×1
GLOVE ECLIPSE 6.0 STRL STRAW (GLOVE) ×3 IMPLANT
GLOVE ECLIPSE 8.0 STRL XLNG CF (GLOVE) ×15 IMPLANT
GLOVE SURG SS PI 8.0 STRL IVOR (GLOVE) ×6 IMPLANT
GOWN STRL REIN XL XLG (GOWN DISPOSABLE) ×3 IMPLANT
GOWN SURGICAL LARGE (GOWNS) ×3 IMPLANT
GOWN XL W/COTTON TOWEL STD (GOWNS) ×3 IMPLANT
HOLDER FOLEY CATH W/STRAP (MISCELLANEOUS) ×3 IMPLANT
IV WATER IRR. 1000ML (IV SOLUTION) ×3 IMPLANT
PACK CYSTOSCOPY (CUSTOM PROCEDURE TRAY) ×3 IMPLANT
SPONGE GAUZE 4X4 12PLY (GAUZE/BANDAGES/DRESSINGS) ×3 IMPLANT
SYRINGE 10CC LL (SYRINGE) ×3 IMPLANT
TOWEL NATURAL 6PK STERILE (DISPOSABLE) ×6 IMPLANT
UNDERPAD 30X30 INCONTINENT (UNDERPADS AND DIAPERS) ×6 IMPLANT
WATER STERILE IRR 500ML POUR (IV SOLUTION) ×3 IMPLANT
nucletron selectseed ×3 IMPLANT

## 2012-01-19 NOTE — Progress Notes (Signed)
  Radiation Oncology         [phone]) [phone] ________________________________  Name: Joshua [name] MRN: [id number]  Date: 12/05/2011  DOB: March 02, 1932       Prostate Seed Implant  CC:No primary provider on file.  No ref. provider found  DIAGNOSIS: A 78 year old gentlemen with stage T2a adenocarcinoma of the prostate with a Gleason of 3+4 and a PSA of 1.58.  PROCEDURE: Insertion of radioactive I-125 seeds into the prostate gland.  RADIATION DOSE: 145 Gy, definitive therapy.  TECHNIQUE: [name] was brought to the operating room with the urologist. He was placed in the dorsolithotomy position. He was catheterized and a rectal tube was inserted. The perineum was shaved, prepped and draped. The ultrasound probe was then introduced into the rectum to see the prostate gland.  TREATMENT DEVICE: A needle grid was attached to the ultrasound probe stand and anchor needles were placed.  COMPLEX ISODOSE CALCULATION: The prostate was imaged in 3D using a sagittal sweep of the prostate probe. These images were transferred to the planning computer. There, the prostate, urethra and rectum were defined on each axial reconstructed image. Then, the software created an optimized plan and a few seed positions were adjusted. Then the accepted plan was uploaded to the seed [employer] afterloading unit.  SPECIAL TREATMENT PROCEDURE/SUPERVISION AND HANDLING: The Nucletron FIRST system was used to place the needles under sagittal guidance. A total of 25 needles were used to deposit 68 seeds in the prostate gland. The individual seed activity was 0.394 mCi for a total implant activity of 26.792 mCi.  COMPLEX SIMULATION: At the end of the procedure, an anterior radiograph of the pelvis was obtained to document seed positioning and count. Cystoscopy was performed to check the urethra and bladder.  MICRODOSIMETRY: At the end of the procedure, the patient was emitting 0.36 mrem/hr at 1 meter. Accordingly, he was  considered safe for hospital discharge.  PLAN: The patient will return to the radiation oncology clinic for post implant CT dosimetry in three weeks.   ________________________________  Artist Pais Kathrynn Running, M.D.

## 2012-01-19 NOTE — Interval H&P Note (Signed)
History and Physical Interval Note:  01/19/2012 7:23 AM  Joshua [name]  has presented today for surgery, with the diagnosis of prostate cancer  The various methods of treatment have been discussed with the patient and family. After consideration of risks, benefits and other options for treatment, the patient has consented to  Procedure(s) (LRB): RADIOACTIVE SEED IMPLANT (N/A) CYSTOSCOPY () as a surgical intervention .  The patients' history has been reviewed, patient examined, no change in status, stable for surgery.  I have reviewed the patients' chart and labs.  Questions were answered to the patient's satisfaction.     Darlina Mccaughey J

## 2012-01-19 NOTE — Brief Op Note (Signed)
01/19/2012  9:09 AM  PATIENT:  [name]  78 y.o. male  PRE-OPERATIVE DIAGNOSIS:  T2a Gl 7 prostate cancer  POST-OPERATIVE DIAGNOSIS:  T2a Gl 7 prostate cancer  PROCEDURE:  Procedure(s) (LRB): RADIOACTIVE SEED IMPLANT (N/A) CYSTOSCOPY ()  SURGEON:  Surgeon(s) and Role:    * Oneita Hurt, MD - Assisting    * [name], MD - Primary  PHYSICIAN ASSISTANT:   ASSISTANTS: none   ANESTHESIA:   general  EBL:  Total I/O In: 1000 [I.V.:1000] Out: -   BLOOD ADMINISTERED:none  DRAINS: Urinary Catheter (Foley)   LOCAL MEDICATIONS USED:  NONE  SPECIMEN:  No Specimen  DISPOSITION OF SPECIMEN:  N/A  COUNTS:  YES  TOURNIQUET:  * No tourniquets in log *  DICTATION: .Other Dictation: Dictation Number [id number]  PLAN OF CARE: Discharge to home after PACU  PATIENT DISPOSITION:  PACU - hemodynamically stable.   Delay start of Pharmacological VTE agent (>24hrs) due to surgical blood loss or risk of bleeding: no

## 2012-01-19 NOTE — Transfer of Care (Signed)
Immediate Anesthesia Transfer of Care Note  Patient: Joshua [name]  Procedure(s) Performed: Procedure(s) (LRB): RADIOACTIVE SEED IMPLANT (N/A) CYSTOSCOPY ()  Patient Location: PACU  Anesthesia Type: General  Level of Consciousness: responds to stimulation  Airway & Oxygen Therapy: Patient Spontanous Breathing and Patient connected to nasal cannula oxygen  Post-op Assessment: Report given to PACU RN  Post vital signs: Reviewed and stable  Complications: No apparent anesthesia complications

## 2012-01-19 NOTE — Discharge Instructions (Addendum)
Brachytherapy for Prostate Cancer Care After  Refer to this sheet in the next few weeks. These instructions provide you with information on caring for yourself after your procedure. Your caregiver may also give you more specific instructions. Your treatment has been planned according to current medical practices, but problems sometimes occur. Call your caregiver if you have any problems or questions after your procedure. HOME CARE INSTRUCTIONS   Only take over-the-counter or prescription medicines for pain, discomfort, or fever as directed by your caregiver.   Keep all follow-up appointments as directed by your caregiver.  SEEK MEDICAL CARE IF:   You have persistent or increasing nausea.   You have persistent blood in your stools.   You have abdominal swelling (distention) or tenderness.  SEEK IMMEDIATE MEDICAL CARE IF:   You have a fever or persistent symptoms for more than 72 hours.   You have a fever and your symptoms suddenly get worse.   You have trouble urinating or are unable to urinate.   You have burning with urination or develop cloudy urine.   You develop any problems that seem to be getting worse.  MAKE SURE YOU:  Understand these instructions.   Will watch your condition.   Will get help right away if you are not doing well or get worse.  Document Released: 10/08/2010 Document Revised: 08/25/2011 Document Reviewed: 10/08/2010 [facility] Patient Information 2012 [place], [place].  Remove catheter in the morning.  Hold plavix for another 24hrs.  Post Anesthesia Home Care Instructions  Activity: Get plenty of rest for the remainder of the day. A responsible adult should stay with you for 24 hours following the procedure.  For the next 24 hours, DO NOT: -Drive a car -Advertising copywriter -Drink alcoholic beverages -Take any medication unless instructed by your physician -Make any legal decisions or sign important papers.  Meals: Start with liquid foods such as  gelatin or soup. Progress to regular foods as tolerated. Avoid greasy, spicy, heavy foods. If nausea and/or vomiting occur, drink only clear liquids until the nausea and/or vomiting subsides. Call your physician if vomiting continues.  Special Instructions/Symptoms: Your throat may feel dry or sore from the anesthesia or the breathing tube placed in your throat during surgery. If this causes discomfort, gargle with warm salt water. The discomfort should disappear within 24 hours.  Radioactive Seed Implant Home Care Instructions   Activity:    Rest for the remainder of the day.  Do not drive or            operate equipment today.  You may resume normal     activities in a few days as instructed by your      physician, without risk of harmful radiation       exposure to those around you, provided you follow     the time and distance precautions on the Radiation     Oncology Instruction Sheet.   Meals: Drink plenty of lipuids and eat light foods, such as     gelatin or soup this evening .  You may return to normal meal    plan tomorrow.  Return To Work: You may return to work as instructed by Designer, multimedia.  Special Instruction: Remove foley catheter ***.  To remove foley, cut      short end of  "Y" on foley catheter.  Drain water.  After the    water has drained, pull catheter from bladder.   If any seeds are    found, use  tweezers to pick up seeds and place in a glass    container of any kind and bring to your physician's office.  Call your physician if any of these symptoms occur:   Persistent or heavy bleeding  Urine stream diminishes or stops completely after catheter is removed  Fever equal to or greater than 101 degrees F  Cloudy urine with a strong foul odor  Severe pain  You may feel some burning pain and/or hesitancy when you urinate after the catheter is removed.  These symptoms may increase over the next few weeks, but should diminish within forur to six weeks.  Applying  moist heat to the lower abdomen or a hot tub bath may help relieve the pain.  If the discomfort becomes severe, please call your physician for additional medications.  Follow-up (Date of Return Visit to Physician): ***  Patient:_______________________________   @date @  Nurse:________________________________ @date @

## 2012-01-19 NOTE — Anesthesia Procedure Notes (Signed)
Procedure Name: LMA Insertion Date/Time: 01/19/2012 7:40 AM Performed by: Maris Berger T Pre-anesthesia Checklist: Patient identified, Emergency Drugs available, Suction available and Patient being monitored Patient Re-evaluated:Patient Re-evaluated prior to inductionOxygen Delivery Method: Circle System Utilized Preoxygenation: Pre-oxygenation with 100% oxygen Intubation Type: IV induction Ventilation: Mask ventilation without difficulty LMA: LMA inserted LMA Size: 5.0 Number of attempts: 1 Airway Equipment and Method: bite block Placement Confirmation: positive ETCO2 Tube secured with: Tape Dental Injury: Teeth and Oropharynx as per pre-operative assessment

## 2012-01-19 NOTE — Anesthesia Postprocedure Evaluation (Signed)
Anesthesia Post Note  Patient: Joshua [name]  Procedure(s) Performed: Procedure(s) (LRB): RADIOACTIVE SEED IMPLANT (N/A) CYSTOSCOPY ()  Anesthesia type: General  Patient location: PACU  Post pain: Pain level controlled  Post assessment: Post-op Vital signs reviewed  Last Vitals:  Filed Vitals:   01/19/12 0916  BP:   Pulse: 68  Temp: 36.4 C  Resp: 12    Post vital signs: Reviewed  Level of consciousness: sedated  Complications: No apparent anesthesia complications

## 2012-01-20 ENCOUNTER — Encounter (HOSPITAL_BASED_OUTPATIENT_CLINIC_OR_DEPARTMENT_OTHER): Payer: Self-pay | Admitting: Urology

## 2012-01-20 NOTE — Op Note (Signed)
[name]                 ACCOUNT NO.:  [id number]  MEDICAL RECORD NO.:  [id number]  LOCATION:                                 FACILITY:  PHYSICIAN:  Excell Seltzer. Annabell Howells, M.D.    DATE OF BIRTH:  [birth year]  DATE OF PROCEDURE:  01/19/2012 DATE OF DISCHARGE:                              OPERATIVE REPORT   Patient of Dr. Bjorn Pippin.  PROCEDURE:  Prostate brachytherapy and cystoscopy.  PREOPERATIVE DIAGNOSIS:  T2a Gleason 7 adenocarcinoma of the prostate.  POSTOPERATIVE DIAGNOSIS:  T2a Gleason 7 adenocarcinoma of the prostate.  SURGEON:  Excell Seltzer. Annabell Howells, M.D.  ANESTHESIA:  General.  SPECIMEN:  None.  DRAINS:  A 16-French Foley catheter.  COMPLICATIONS:  None.  ESTIMATED BLOOD LOSS:  Minimal.  INDICATIONS:  Joshua [name] is a 78 year old white male with a T2a Gleason 7 adenocarcinoma of the prostate.  He has elected to undergo brachytherapy for treatment of his cancer.  FINDINGS OF PROCEDURE:  He was given Cipro.  He was taken to the operating room, where general anesthetic was induced.  He was fitted with PAS hose and placed in lithotomy position.  His perineum was clipped.  His genitalia was prepped with Betadine solution and a 16- French Foley catheter was inserted.  The balloon was filled with loop contrast.  A red rubber rectal catheter was placed and secured.  The patient's scrotum was reflected cephalad with an OpSite.  The perineum was then prepped with Betadine solution.  The ultrasound probe was assembled and inserted and secured to the fixed base.  The ultrasound grid was placed.  The stabilization needles were then inserted and secured.  The prostate was then imaged to create the treatment plan. The Nucletron device was then used to create the treatment plan.  Once the treatment plan had been completed, I joined Dr. Kathrynn Running, radiation physicist, and placed the needles according to plan using the Nucletron device for removal.  A total of 25 needles were used to  place 68 I-125 seeds.  After completion of the implantation, the stabilization needles removed followed by the ultrasound probe, and the fluoroscopy was used to obtain a posttreatment image.  The genitalia was then undraped.  The Foley catheter was removed.  He was then re-prepped and cystoscopy was performed with a 16-French flexible scope.  Inspection revealed a normal urethra.  The external sphincter was intact.  The prostatic urethra was approximately 3 cm in length with bilobar hyperplasia without significant obstruction. Examination of bladder revealed mild trabeculation.  No tumors, stones, or inflammation were noted.  No seeds or clots were seen.  At completion of cystoscopy, a fresh 16-French Foley catheter was inserted.  Balloon was filled with 10 cc of sterile water and the catheter was placed to straight drainage.  The patient's perineum was then cleansed.  The red rubber rectal catheter was removed.  A dressing was applied.  He was taken down from lithotomy position.  His anesthetic was reversed.  He was moved to recovery in stable condition. There were no complications.     Excell Seltzer. Annabell Howells, M.D.     JJW/MEDQ  D:  01/19/2012  T:  01/20/2012  Job:  [id number]  cc:   Oneita Hurt, M.D. Fax: [phone]

## 2012-02-07 NOTE — Progress Notes (Signed)
Encounter addended by: Lowella Petties, RN on: 02/07/2012 10:19 AM<BR>     Documentation filed: Charges VN

## 2012-02-08 ENCOUNTER — Telehealth: Payer: Self-pay | Admitting: *Deleted

## 2012-02-08 DIAGNOSIS — L820 Inflamed seborrheic keratosis: Secondary | ICD-10-CM | POA: Diagnosis not present

## 2012-02-08 DIAGNOSIS — Z85828 Personal history of other malignant neoplasm of skin: Secondary | ICD-10-CM | POA: Diagnosis not present

## 2012-02-08 DIAGNOSIS — L570 Actinic keratosis: Secondary | ICD-10-CM | POA: Diagnosis not present

## 2012-02-08 NOTE — Telephone Encounter (Signed)
Called patient to remind of appts. For 02-09-12, lvm for a return call

## 2012-02-09 ENCOUNTER — Encounter: Payer: Self-pay | Admitting: Radiation Oncology

## 2012-02-09 ENCOUNTER — Ambulatory Visit
Admission: RE | Admit: 2012-02-09 | Discharge: 2012-02-09 | Disposition: A | Discharge status: Home / Self Care | Payer: Medicare Other | Source: Ambulatory Visit | Attending: Radiation Oncology | Admitting: Radiation Oncology

## 2012-02-09 VITALS — BP 148/86 | HR 65 | Wt 195.7 lb

## 2012-02-09 DIAGNOSIS — I10 Essential (primary) hypertension: Secondary | ICD-10-CM | POA: Diagnosis not present

## 2012-02-09 DIAGNOSIS — R3915 Urgency of urination: Secondary | ICD-10-CM | POA: Diagnosis not present

## 2012-02-09 DIAGNOSIS — N529 Male erectile dysfunction, unspecified: Secondary | ICD-10-CM | POA: Diagnosis not present

## 2012-02-09 DIAGNOSIS — C61 Malignant neoplasm of prostate: Secondary | ICD-10-CM

## 2012-02-09 DIAGNOSIS — K219 Gastro-esophageal reflux disease without esophagitis: Secondary | ICD-10-CM | POA: Diagnosis not present

## 2012-02-09 DIAGNOSIS — Z87891 Personal history of nicotine dependence: Secondary | ICD-10-CM | POA: Diagnosis not present

## 2012-02-09 DIAGNOSIS — E7800 Pure hypercholesterolemia, unspecified: Secondary | ICD-10-CM | POA: Diagnosis not present

## 2012-02-09 NOTE — Progress Notes (Signed)
Here post prostate seed implant on 12/05/11. Denies burning on urination. Has occasional frequency but definitely urgency of urination.Bowel regime normal. IPSS =19.

## 2012-02-09 NOTE — Progress Notes (Signed)
  Radiation Oncology         [phone] ________________________________  Name: Joshua [name] MRN: [id number]  Date: 02/09/2012  DOB: 1932-03-20  COMPLEX SIMULATION NOTE  NARRATIVE:  The patient was brought to the CT Simulation planning suite today following prostate seed implantation approximately one month ago.  Identity was confirmed.  All relevant records and images related to the planned course of therapy were reviewed.  Then, the patient was set-up supine.  CT images were obtained.  The CT images were loaded into the planning software.  Then the prostate and rectum were contoured.  Treatment planning then occurred.  The implanted iodine 125 seeds were identified by the physics staff for projection of radiation distribution  I have requested : 3D Simulation  I have requested a DVH of the following structures: Prostate and rectum.   ________________________________  Artist Pais Kathrynn Running, M.D.

## 2012-02-09 NOTE — Progress Notes (Signed)
  Radiation Oncology         [phone]) [phone] ________________________________  Name: Joshua [name] MRN: [id number]  Date: 02/09/2012  DOB: 27-Jul-1932  Follow-Up Visit Note  CC: No primary provider on file.  [name], MD  Diagnosis:   78 year old gentleman with stage T2a adenocarcinoma the prostate with Gleason score 3+4 and PSA of 1.58  Interval Since Last Radiation:  2 months  Narrative:  The patient returns today for routine follow-up.  He is complaining of increased urinary frequency and urinary hesitation symptoms. He filled out a questionnaire regarding urinary function today providing and overall IPSS score of 19 characterizing his symptoms as severe.  His pre-implant score was 7. He denies any bowel symptoms.  ALLERGIES:  is allergic to penicillins.  Meds: Current Outpatient Prescriptions  Medication Sig Dispense Refill  . Calcium 500 MG CHEW Chew 500 mg by mouth daily.      . clopidogrel (PLAVIX) 75 MG tablet Take 75 mg by mouth daily.      Marland Kitchen omeprazole (PRILOSEC) 20 MG capsule Take 20 mg by mouth daily.      Marland Kitchen PHENobarbital (LUMINAL) 32.4 MG tablet Take 32.4 mg by mouth 2 (two) times daily. 2 tabs in am, 3 tabs pm per pt statement      . phenytoin (DILANTIN) 100 MG ER capsule Take by mouth 2 (two) times daily. 2 am, 2 pm      . pravastatin (PRAVACHOL) 80 MG tablet Take 80 mg by mouth daily.      Marland Kitchen VITAMIN D, ERGOCALCIFEROL, PO Take 600 Units by mouth daily.         Physical Findings: The patient is in no acute distress. Patient is alert and oriented.  weight is 195 lb 11.2 oz (88.769 kg). His blood pressure is 148/86 and his pulse is 65. .  No significant changes.  Lab Findings: Lab Results  Component Value Date   WBC 8.5 01/19/2012   HGB 13.9 01/19/2012   HCT 40.3 01/19/2012   MCV 86.5 01/19/2012   PLT 247 01/19/2012    Radiographic Findings:  Patient underwent CT imaging in our clinic for post implant dosimetry. The CT appears to demonstrate an adequate distribution  of radioactive seeds throughout the prostate gland. There no seeds in her near the rectum. I suspect the final radiation plan and dosimetry will show appropriate coverage of the prostate gland.   Impression: The patient is recovering from the effects of radiation. His urinary symptoms should gradually improve over the next 4-6 months. We talked about this today   Plan: Today, I spent time talking to the patient about his prostate seed implant and resolving urinary symptoms. Which for long-term followup for prostate cancer following seed implant. He understands that ongoing PSA determinations and digital rectal exams will help perform surveillance to rule out disease recurrence. He understands what to expect with his PSA measures. Patient was also educated today about some of the long-term effects would radiation including the small risk for rectal bleeding and possibly erectile dysfunction. Talked about some of the general management approaches to these potential complications. However, I did encourage the patient to contact her office or return at any point if he has questions or concerns related to his previous radiation and prostate cancer.   _____________________________________  Artist Pais. Kathrynn Running, M.D.

## 2012-03-26 ENCOUNTER — Ambulatory Visit
Admission: RE | Admit: 2012-03-26 | Discharge: 2012-03-26 | Disposition: A | Discharge status: Home / Self Care | Payer: Medicare Other | Source: Ambulatory Visit | Attending: Radiation Oncology | Admitting: Radiation Oncology

## 2012-03-26 ENCOUNTER — Encounter: Payer: Self-pay | Admitting: Radiation Oncology

## 2012-03-26 DIAGNOSIS — C61 Malignant neoplasm of prostate: Secondary | ICD-10-CM | POA: Diagnosis not present

## 2012-04-01 NOTE — Progress Notes (Signed)
  Radiation Oncology         [phone]) [phone] ________________________________  Name: Joshua [name] MRN: [id number]  Date: 03/26/2012  DOB: 06-21-1932  3-D Planning Note Prostate Brachytherapy  CC:  Dr. Annabell Howells  Diagnosis: 78 year old gentleman with stage T2a. adenocarcinoma of the prostate with a Gleason's score of 3+4 and PSA of 1.58  Narrative: [name] returned following prostate seed implantation for post implant planning. He underwent CT scan to delineate the three-dimensional structures of the pelvis and demonstrate the radiation distribution.  Results:   Prostate Coverage - The dose of radiation delivered to the 90% or more of the prostate gland (D90) was 123.26% of the prescription dose. This exceeds our goal of greater than 90%. Rectal Sparing - The volume of rectal tissue receiving the prescription dose or higher was 0.66 cc. This falls under our thresholds tolerance of 1.0 cc.  Impression: The prostate seed implant appears to show adequate target coverage and appropriate rectal sparing.  Plan:  The patient will continue to follow with urology for ongoing PSA determinations. I would anticipate a high likelihood for local tumor control with minimal risk for rectal morbidity.   Artist Pais Kathrynn Running, M.D.

## 2012-04-02 DIAGNOSIS — L509 Urticaria, unspecified: Secondary | ICD-10-CM | POA: Diagnosis not present

## 2012-04-03 DIAGNOSIS — I10 Essential (primary) hypertension: Secondary | ICD-10-CM | POA: Diagnosis not present

## 2012-04-03 DIAGNOSIS — E785 Hyperlipidemia, unspecified: Secondary | ICD-10-CM | POA: Diagnosis not present

## 2012-04-05 DIAGNOSIS — I10 Essential (primary) hypertension: Secondary | ICD-10-CM | POA: Diagnosis not present

## 2012-04-05 DIAGNOSIS — E785 Hyperlipidemia, unspecified: Secondary | ICD-10-CM | POA: Diagnosis not present

## 2012-04-05 DIAGNOSIS — E559 Vitamin D deficiency, unspecified: Secondary | ICD-10-CM | POA: Diagnosis not present

## 2012-04-10 DIAGNOSIS — L2089 Other atopic dermatitis: Secondary | ICD-10-CM | POA: Diagnosis not present

## 2012-04-10 DIAGNOSIS — C61 Malignant neoplasm of prostate: Secondary | ICD-10-CM | POA: Diagnosis not present

## 2012-04-11 DIAGNOSIS — Z79899 Other long term (current) drug therapy: Secondary | ICD-10-CM | POA: Diagnosis not present

## 2012-04-19 ENCOUNTER — Other Ambulatory Visit: Payer: Self-pay | Admitting: Family Medicine

## 2012-04-19 DIAGNOSIS — K769 Liver disease, unspecified: Secondary | ICD-10-CM

## 2012-04-23 DIAGNOSIS — R7989 Other specified abnormal findings of blood chemistry: Secondary | ICD-10-CM | POA: Diagnosis not present

## 2012-04-23 DIAGNOSIS — Z09 Encounter for follow-up examination after completed treatment for conditions other than malignant neoplasm: Secondary | ICD-10-CM | POA: Diagnosis not present

## 2012-04-23 DIAGNOSIS — R569 Unspecified convulsions: Secondary | ICD-10-CM | POA: Diagnosis not present

## 2012-04-24 ENCOUNTER — Ambulatory Visit
Admission: RE | Admit: 2012-04-24 | Discharge: 2012-04-24 | Disposition: A | Discharge status: Home / Self Care | Payer: Medicare Other | Source: Ambulatory Visit | Attending: Family Medicine | Admitting: Family Medicine

## 2012-04-24 DIAGNOSIS — C61 Malignant neoplasm of prostate: Secondary | ICD-10-CM | POA: Diagnosis not present

## 2012-04-24 DIAGNOSIS — N403 Nodular prostate with lower urinary tract symptoms: Secondary | ICD-10-CM | POA: Diagnosis not present

## 2012-04-24 DIAGNOSIS — R3915 Urgency of urination: Secondary | ICD-10-CM | POA: Diagnosis not present

## 2012-04-24 DIAGNOSIS — D1800 Hemangioma unspecified site: Secondary | ICD-10-CM | POA: Diagnosis not present

## 2012-04-24 DIAGNOSIS — K769 Liver disease, unspecified: Secondary | ICD-10-CM

## 2012-04-24 DIAGNOSIS — K7689 Other specified diseases of liver: Secondary | ICD-10-CM | POA: Diagnosis not present

## 2012-04-24 DIAGNOSIS — R339 Retention of urine, unspecified: Secondary | ICD-10-CM | POA: Diagnosis not present

## 2012-04-24 DIAGNOSIS — N138 Other obstructive and reflux uropathy: Secondary | ICD-10-CM | POA: Diagnosis not present

## 2012-04-24 DIAGNOSIS — Z8546 Personal history of malignant neoplasm of prostate: Secondary | ICD-10-CM | POA: Diagnosis not present

## 2012-04-24 MED ORDER — GADOBENATE DIMEGLUMINE 529 MG/ML IV SOLN
19.0000 mL | Freq: Once | INTRAVENOUS | Status: AC | PRN
  Administered 2012-04-24: 19 mL via INTRAVENOUS

## 2012-05-15 DIAGNOSIS — Z85828 Personal history of other malignant neoplasm of skin: Secondary | ICD-10-CM | POA: Diagnosis not present

## 2012-07-18 DIAGNOSIS — Z79899 Other long term (current) drug therapy: Secondary | ICD-10-CM | POA: Diagnosis not present

## 2012-07-25 DIAGNOSIS — Z79899 Other long term (current) drug therapy: Secondary | ICD-10-CM | POA: Diagnosis not present

## 2012-07-25 DIAGNOSIS — R7989 Other specified abnormal findings of blood chemistry: Secondary | ICD-10-CM | POA: Diagnosis not present

## 2012-08-01 DIAGNOSIS — M542 Cervicalgia: Secondary | ICD-10-CM | POA: Diagnosis not present

## 2012-08-01 DIAGNOSIS — M545 Low back pain: Secondary | ICD-10-CM | POA: Diagnosis not present

## 2012-09-18 DIAGNOSIS — Z23 Encounter for immunization: Secondary | ICD-10-CM | POA: Diagnosis not present

## 2012-10-31 DIAGNOSIS — Z125 Encounter for screening for malignant neoplasm of prostate: Secondary | ICD-10-CM | POA: Diagnosis not present

## 2012-10-31 DIAGNOSIS — E559 Vitamin D deficiency, unspecified: Secondary | ICD-10-CM | POA: Diagnosis not present

## 2012-10-31 DIAGNOSIS — E785 Hyperlipidemia, unspecified: Secondary | ICD-10-CM | POA: Diagnosis not present

## 2012-10-31 DIAGNOSIS — Z79899 Other long term (current) drug therapy: Secondary | ICD-10-CM | POA: Diagnosis not present

## 2012-10-31 DIAGNOSIS — R7989 Other specified abnormal findings of blood chemistry: Secondary | ICD-10-CM | POA: Diagnosis not present

## 2012-10-31 DIAGNOSIS — I10 Essential (primary) hypertension: Secondary | ICD-10-CM | POA: Diagnosis not present

## 2012-11-13 DIAGNOSIS — Z85828 Personal history of other malignant neoplasm of skin: Secondary | ICD-10-CM | POA: Diagnosis not present

## 2012-11-13 DIAGNOSIS — D485 Neoplasm of uncertain behavior of skin: Secondary | ICD-10-CM | POA: Diagnosis not present

## 2012-11-27 ENCOUNTER — Ambulatory Visit: Payer: Medicare Other | Attending: Family Medicine | Admitting: Physical Therapy

## 2012-11-27 DIAGNOSIS — R5381 Other malaise: Secondary | ICD-10-CM | POA: Insufficient documentation

## 2012-11-27 DIAGNOSIS — R569 Unspecified convulsions: Secondary | ICD-10-CM | POA: Diagnosis not present

## 2012-11-27 DIAGNOSIS — IMO0001 Reserved for inherently not codable concepts without codable children: Secondary | ICD-10-CM | POA: Insufficient documentation

## 2012-11-27 DIAGNOSIS — M6281 Muscle weakness (generalized): Secondary | ICD-10-CM | POA: Insufficient documentation

## 2012-11-28 ENCOUNTER — Ambulatory Visit: Payer: Medicare Other | Admitting: *Deleted

## 2012-11-28 DIAGNOSIS — R5381 Other malaise: Secondary | ICD-10-CM | POA: Diagnosis not present

## 2012-11-28 DIAGNOSIS — IMO0001 Reserved for inherently not codable concepts without codable children: Secondary | ICD-10-CM | POA: Diagnosis not present

## 2012-11-28 DIAGNOSIS — M6281 Muscle weakness (generalized): Secondary | ICD-10-CM | POA: Diagnosis not present

## 2012-12-03 ENCOUNTER — Ambulatory Visit: Payer: Medicare Other | Admitting: *Deleted

## 2012-12-03 DIAGNOSIS — R5381 Other malaise: Secondary | ICD-10-CM | POA: Diagnosis not present

## 2012-12-03 DIAGNOSIS — IMO0001 Reserved for inherently not codable concepts without codable children: Secondary | ICD-10-CM | POA: Diagnosis not present

## 2012-12-03 DIAGNOSIS — M6281 Muscle weakness (generalized): Secondary | ICD-10-CM | POA: Diagnosis not present

## 2012-12-05 ENCOUNTER — Ambulatory Visit: Payer: Medicare Other | Admitting: *Deleted

## 2012-12-05 DIAGNOSIS — R5381 Other malaise: Secondary | ICD-10-CM | POA: Diagnosis not present

## 2012-12-05 DIAGNOSIS — M6281 Muscle weakness (generalized): Secondary | ICD-10-CM | POA: Diagnosis not present

## 2012-12-05 DIAGNOSIS — IMO0001 Reserved for inherently not codable concepts without codable children: Secondary | ICD-10-CM | POA: Diagnosis not present

## 2012-12-11 ENCOUNTER — Ambulatory Visit: Payer: Medicare Other | Admitting: Physical Therapy

## 2012-12-11 DIAGNOSIS — IMO0001 Reserved for inherently not codable concepts without codable children: Secondary | ICD-10-CM | POA: Diagnosis not present

## 2012-12-11 DIAGNOSIS — R5381 Other malaise: Secondary | ICD-10-CM | POA: Diagnosis not present

## 2012-12-11 DIAGNOSIS — M6281 Muscle weakness (generalized): Secondary | ICD-10-CM | POA: Diagnosis not present

## 2012-12-12 DIAGNOSIS — H31009 Unspecified chorioretinal scars, unspecified eye: Secondary | ICD-10-CM | POA: Diagnosis not present

## 2012-12-12 DIAGNOSIS — H01009 Unspecified blepharitis unspecified eye, unspecified eyelid: Secondary | ICD-10-CM | POA: Diagnosis not present

## 2012-12-12 DIAGNOSIS — H259 Unspecified age-related cataract: Secondary | ICD-10-CM | POA: Diagnosis not present

## 2012-12-12 DIAGNOSIS — H04129 Dry eye syndrome of unspecified lacrimal gland: Secondary | ICD-10-CM | POA: Diagnosis not present

## 2012-12-13 ENCOUNTER — Ambulatory Visit: Payer: Medicare Other | Admitting: Physical Therapy

## 2012-12-13 DIAGNOSIS — IMO0001 Reserved for inherently not codable concepts without codable children: Secondary | ICD-10-CM | POA: Diagnosis not present

## 2012-12-13 DIAGNOSIS — R5381 Other malaise: Secondary | ICD-10-CM | POA: Diagnosis not present

## 2012-12-13 DIAGNOSIS — M6281 Muscle weakness (generalized): Secondary | ICD-10-CM | POA: Diagnosis not present

## 2012-12-18 ENCOUNTER — Ambulatory Visit: Payer: Medicare Other | Attending: Family Medicine | Admitting: Physical Therapy

## 2012-12-18 DIAGNOSIS — M6281 Muscle weakness (generalized): Secondary | ICD-10-CM | POA: Diagnosis not present

## 2012-12-18 DIAGNOSIS — C61 Malignant neoplasm of prostate: Secondary | ICD-10-CM | POA: Diagnosis not present

## 2012-12-18 DIAGNOSIS — R3915 Urgency of urination: Secondary | ICD-10-CM | POA: Diagnosis not present

## 2012-12-18 DIAGNOSIS — R5381 Other malaise: Secondary | ICD-10-CM | POA: Diagnosis not present

## 2012-12-18 DIAGNOSIS — R339 Retention of urine, unspecified: Secondary | ICD-10-CM | POA: Diagnosis not present

## 2012-12-18 DIAGNOSIS — IMO0001 Reserved for inherently not codable concepts without codable children: Secondary | ICD-10-CM | POA: Diagnosis not present

## 2012-12-20 ENCOUNTER — Ambulatory Visit: Payer: Medicare Other | Admitting: Physical Therapy

## 2012-12-25 ENCOUNTER — Ambulatory Visit: Payer: Medicare Other | Admitting: Physical Therapy

## 2012-12-27 ENCOUNTER — Ambulatory Visit: Payer: Medicare Other | Admitting: Physical Therapy

## 2013-01-01 ENCOUNTER — Ambulatory Visit: Payer: Medicare Other | Admitting: Physical Therapy

## 2013-01-03 ENCOUNTER — Ambulatory Visit: Payer: Medicare Other | Admitting: Physical Therapy

## 2013-01-08 ENCOUNTER — Ambulatory Visit: Payer: Medicare Other | Admitting: Physical Therapy

## 2013-01-10 ENCOUNTER — Ambulatory Visit: Payer: Medicare Other | Admitting: Physical Therapy

## 2013-01-21 ENCOUNTER — Telehealth: Payer: Self-pay | Admitting: Family Medicine

## 2013-01-22 ENCOUNTER — Telehealth: Payer: Self-pay | Admitting: *Deleted

## 2013-01-22 MED ORDER — PRAVASTATIN SODIUM 80 MG PO TABS: 80.0000 mg | ORAL_TABLET | Freq: Every day | ORAL | Status: DC

## 2013-01-22 NOTE — Telephone Encounter (Signed)
Pt needs refill on Pravastatin 80mg  for mail order

## 2013-01-23 NOTE — Telephone Encounter (Signed)
WILL SEND RX FOR PROVIDER TO REVIEW

## 2013-01-24 ENCOUNTER — Other Ambulatory Visit: Payer: Self-pay | Admitting: *Deleted

## 2013-01-24 MED ORDER — PRAVASTATIN SODIUM 80 MG PO TABS: 80.0000 mg | ORAL_TABLET | Freq: Every day | ORAL | Status: DC

## 2013-02-14 ENCOUNTER — Encounter: Payer: Self-pay | Admitting: Family Medicine

## 2013-02-14 ENCOUNTER — Ambulatory Visit (INDEPENDENT_AMBULATORY_CARE_PROVIDER_SITE_OTHER): Payer: Medicare Other | Admitting: Family Medicine

## 2013-02-14 VITALS — BP 133/69 | HR 62 | Temp 96.6°F | Ht 71.25 in | Wt 196.8 lb

## 2013-02-14 DIAGNOSIS — K219 Gastro-esophageal reflux disease without esophagitis: Secondary | ICD-10-CM

## 2013-02-14 DIAGNOSIS — I10 Essential (primary) hypertension: Secondary | ICD-10-CM

## 2013-02-14 DIAGNOSIS — R569 Unspecified convulsions: Secondary | ICD-10-CM

## 2013-02-14 DIAGNOSIS — E7800 Pure hypercholesterolemia, unspecified: Secondary | ICD-10-CM

## 2013-02-14 DIAGNOSIS — C61 Malignant neoplasm of prostate: Secondary | ICD-10-CM | POA: Diagnosis not present

## 2013-02-14 NOTE — Patient Instructions (Signed)
Continue current medications Be careful and not climb and so you won't fall Continue aggressive therapeutic lifestyle change

## 2013-02-14 NOTE — Progress Notes (Signed)
  Subjective:    Patient ID: Joshua [name], male    DOB: Jul 04, 1932, 79 y.o.   MRN: [id number]  HPI Patient is here for followup of his chronic medical problems. 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 He has however been seizure free for many years. He is feeling well and doing well her review of systems. He sees Dr. Annabell [name] for his prostate cancer.   Review of Systems  HENT: Negative.   Respiratory: Negative.   Cardiovascular: Negative.   Gastrointestinal: Negative.   Endocrine: Negative.   Genitourinary: Negative.   Allergic/Immunologic: Negative.   Neurological: Negative.   Hematological: Negative.   Psychiatric/Behavioral: Negative.        Objective:   Physical Exam BP 133/69  Pulse 62  Temp(Src) 96.6 F (35.9 C) (Oral)  Ht 5' 11.25" (1.81 m)  Wt 196 lb 12.8 oz (89.268 kg)  BMI 27.25 kg/m2  The patient appeared well nourished and normally developed, alert and oriented to time and place. Speech, behavior and judgement appear normal. Vital signs as documented.  Head exam is unremarkable. No scleral icterus or pallor noted.  Neck is without jugular venous distension, thyromegally, or carotid bruits. Carotid upstrokes are brisk bilaterally. No cervical adenopathy. Lungs are clear anteriorly and posteriorly to auscultation. Normal respiratory effort. Cardiac exam reveals regular rate and rhythm at 72 per minute. First and second heart sounds normal.  No murmurs, rubs or gallops.  Abdominal exam reveals normal bowl sounds, no masses, no organomegaly and no aortic enlargement. No inguinal adenopathy. Extremities are nonedematous and both femoral and pedal pulses are normal. He does have a lot of varicose veins around each ankle. Skin without pallor or jaundice.  Warm and dry, without rash. Neurologic exam reveals normal deep tendon reflexes and normal sensation.           Assessment & Plan:  1. GERD -Continue current  medication  2. Hypercholesterolemia  3. Hypertension -Continue current medicine -Blood pressures were reviewed and these were all good  4. Prostate cancer -Followup with Dr. Annabell [name]  5. Seizures -Stable on current treatment   Patient Instructions  Continue current medications Be careful and not climb and so you won't fall Continue aggressive therapeutic lifestyle change   Labs will be drawn when patient is fasting Due drug levels at next visit for her Dilantin

## 2013-02-20 ENCOUNTER — Encounter: Payer: Self-pay | Admitting: Family Medicine

## 2013-03-13 DIAGNOSIS — L821 Other seborrheic keratosis: Secondary | ICD-10-CM | POA: Diagnosis not present

## 2013-03-13 DIAGNOSIS — L820 Inflamed seborrheic keratosis: Secondary | ICD-10-CM | POA: Diagnosis not present

## 2013-05-13 ENCOUNTER — Other Ambulatory Visit: Payer: Self-pay | Admitting: Family Medicine

## 2013-05-14 ENCOUNTER — Other Ambulatory Visit: Payer: Self-pay | Admitting: Dermatology

## 2013-05-14 DIAGNOSIS — L820 Inflamed seborrheic keratosis: Secondary | ICD-10-CM | POA: Diagnosis not present

## 2013-05-14 DIAGNOSIS — L570 Actinic keratosis: Secondary | ICD-10-CM | POA: Diagnosis not present

## 2013-05-14 DIAGNOSIS — C44621 Squamous cell carcinoma of skin of unspecified upper limb, including shoulder: Secondary | ICD-10-CM | POA: Diagnosis not present

## 2013-05-15 MED ORDER — CLOPIDOGREL BISULFATE 75 MG PO TABS: 75.0000 mg | ORAL_TABLET | Freq: Every day | ORAL | Status: DC

## 2013-05-15 NOTE — Telephone Encounter (Signed)
Pt wants to pick rx up today, will print,

## 2013-06-18 ENCOUNTER — Telehealth: Payer: Self-pay | Admitting: Family Medicine

## 2013-06-19 MED ORDER — PHENOBARBITAL 32.4 MG PO TABS: 32.4000 mg | ORAL_TABLET | Freq: Two times a day (BID) | ORAL | Status: DC

## 2013-06-19 NOTE — Telephone Encounter (Signed)
Pt here to pick up rx- done

## 2013-07-02 ENCOUNTER — Other Ambulatory Visit (INDEPENDENT_AMBULATORY_CARE_PROVIDER_SITE_OTHER): Payer: Medicare Other

## 2013-07-02 DIAGNOSIS — C61 Malignant neoplasm of prostate: Secondary | ICD-10-CM | POA: Diagnosis not present

## 2013-07-03 LAB — PSA, TOTAL AND FREE
PSA, Free Pct: 20 %
PSA: 0.1 ng/mL (ref 0.0–4.0)

## 2013-07-08 ENCOUNTER — Encounter: Payer: Self-pay | Admitting: Family Medicine

## 2013-07-08 ENCOUNTER — Ambulatory Visit (INDEPENDENT_AMBULATORY_CARE_PROVIDER_SITE_OTHER): Payer: Medicare Other | Admitting: Family Medicine

## 2013-07-08 VITALS — BP 141/72 | HR 56 | Temp 97.3°F | Ht 71.25 in | Wt 193.0 lb

## 2013-07-08 DIAGNOSIS — E559 Vitamin D deficiency, unspecified: Secondary | ICD-10-CM

## 2013-07-08 DIAGNOSIS — E7800 Pure hypercholesterolemia, unspecified: Secondary | ICD-10-CM | POA: Diagnosis not present

## 2013-07-08 DIAGNOSIS — K219 Gastro-esophageal reflux disease without esophagitis: Secondary | ICD-10-CM | POA: Diagnosis not present

## 2013-07-08 DIAGNOSIS — I10 Essential (primary) hypertension: Secondary | ICD-10-CM

## 2013-07-08 DIAGNOSIS — R569 Unspecified convulsions: Secondary | ICD-10-CM

## 2013-07-08 DIAGNOSIS — Z23 Encounter for immunization: Secondary | ICD-10-CM | POA: Diagnosis not present

## 2013-07-08 DIAGNOSIS — C61 Malignant neoplasm of prostate: Secondary | ICD-10-CM

## 2013-07-08 MED ORDER — PANTOPRAZOLE SODIUM 40 MG PO TBEC: DELAYED_RELEASE_TABLET | ORAL | Status: DC

## 2013-07-08 MED ORDER — PHENOBARBITAL 32.4 MG PO TABS: ORAL_TABLET | ORAL | Status: DC

## 2013-07-08 NOTE — Progress Notes (Signed)
Subjective:    Patient ID: Joshua [name], male    DOB: Oct 17, 1931, 79 y.o.   MRN: [id number]  HPI Pt here for follow up and management of chronic medical problems. The patient is up to date on his health maintenance issues. He sees Dr.Wrenn for followup of his prostate cancer. We screen him for his phenobarbital and Dilantin levels.    Patient Active Problem List   Diagnosis Date Noted  . Prostate cancer   . Hypercholesterolemia   . Hypertension   . Seizures   . GERD 08/22/2008  . COLONIC POLYPS, ADENOMATOUS, HX OF 08/22/2008   Outpatient Encounter Prescriptions as of 07/08/2013  Medication Sig Dispense Refill  . calcium carbonate (OS-CAL) 600 MG TABS tablet Take 600 mg by mouth daily with breakfast.      . clopidogrel (PLAVIX) 75 MG tablet Take 1 tablet (75 mg total) by mouth daily.  90 tablet  1  . PHENobarbital (LUMINAL) 32.4 MG tablet Take 1 tablet (32.4 mg total) by mouth 2 (two) times daily. Take 2 tabs in am, 3 tabs pm per pt statement  450 tablet  1  . phenytoin (DILANTIN) 100 MG ER capsule Take 100 mg by mouth 2 (two) times daily. 2 am, 1 qhs qod      . pravastatin (PRAVACHOL) 80 MG tablet Take 1 tablet (80 mg total) by mouth daily.  90 tablet  3  . VITAMIN D, ERGOCALCIFEROL, PO Take 600 Units by mouth daily.       . [DISCONTINUED] Glucosamine-Chondroitin (OSTEO BI-FLEX REGULAR STRENGTH PO) Take 1 tablet by mouth daily.       No facility-administered encounter medications on file as of 07/08/2013.    Review of Systems  Constitutional: Negative.   HENT: Negative.   Eyes: Negative.   Respiratory: Negative.   Cardiovascular: Negative.   Gastrointestinal: Negative.   Endocrine: Negative.   Genitourinary: Negative.   Musculoskeletal: Negative.   Skin: Negative.   Allergic/Immunologic: Negative.   Neurological: Negative.   Hematological: Negative.   Psychiatric/Behavioral: Negative.        Objective:   Physical Exam  Nursing note and vitals  reviewed. Constitutional: He is oriented to person, place, and time. He appears well-developed and well-nourished. No distress.  HENT:  Head: Normocephalic and atraumatic.  Right Ear: External ear normal.  Left Ear: External ear normal.  Nose: Nose normal.  Mouth/Throat: Oropharynx is clear and moist. No oropharyngeal exudate.  Scar tissue on the scalp secondary to brain surgery many years ago  Eyes: Conjunctivae and EOM are normal. Right eye exhibits no discharge. Left eye exhibits no discharge. No scleral icterus.  Neck: Normal range of motion. Neck supple. No tracheal deviation present. No thyromegaly present.  Cardiovascular: Normal rate, regular rhythm, normal heart sounds and intact distal pulses.  Exam reveals no gallop and no friction rub.   No murmur heard. At 72 per minute  Pulmonary/Chest: Effort normal and breath sounds normal. No respiratory distress. He has no wheezes. He has no rales. He exhibits no tenderness.  Abdominal: Soft. Bowel sounds are normal. He exhibits no mass. There is no tenderness. There is no rebound and no guarding.  Genitourinary:  This exam is done by Dr. Annabell Howells  Musculoskeletal: Normal range of motion. He exhibits no edema and no tenderness.  Lymphadenopathy:    He has no cervical adenopathy.  Neurological: He is alert and oriented to person, place, and time. He has normal reflexes.  Skin: Skin is warm and dry. No rash noted.  No erythema. No pallor.  Psychiatric: He has a normal mood and affect. His behavior is normal. Judgment and thought content normal.   BP 141/72  Pulse 56  Temp(Src) 97.3 F (36.3 C) (Oral)  Ht 5' 11.25" (1.81 m)  Wt 193 lb (87.544 kg)  BMI 26.72 kg/m2        Assessment & Plan:   1. GERD   2. Hypercholesterolemia   3. Hypertension   4. Seizures   5. Vitamin D deficiency   6. Prostate cancer    Orders Placed This Encounter  Procedures  . Hepatic function panel    Standing Status: Future     Number of Occurrences:       Standing Expiration Date: 07/08/2014  . BMP8+EGFR    Standing Status: Future     Number of Occurrences:      Standing Expiration Date: 07/08/2014  . NMR, lipoprofile    Standing Status: Future     Number of Occurrences:      Standing Expiration Date: 07/08/2014  . Vit D  25 hydroxy (rtn osteoporosis monitoring)    Standing Status: Future     Number of Occurrences:      Standing Expiration Date: 07/08/2014  . Phenytoin level, total and free    Standing Status: Future     Number of Occurrences:      Standing Expiration Date: 07/08/2014  . Phenobarbital level    Standing Status: Future     Number of Occurrences:      Standing Expiration Date: 07/08/2014  . POCT CBC    Standing Status: Future     Number of Occurrences:      Standing Expiration Date: 08/08/2013   Patient Instructions  Continue current medications. Continue good therapeutic lifestyle changes.  Fall precautions discussed with patient. Follow up as planned and earlier as needed.  Return to clinic for lab work. Take the new medicine prescribed, Protonix, one half hour before breakfast daily after lab work is drawn. If eating and swallowing problems continue especially after starting the Protonix, please call back and let us know, and we will schedule a visit for another endoscopy and dilatation    Nyra Capes MD

## 2013-07-08 NOTE — Addendum Note (Signed)
Addended by: Baltazar Apo on: 07/08/2013 01:39 PM   Modules accepted: Orders

## 2013-07-08 NOTE — Patient Instructions (Addendum)
Continue current medications. Continue good therapeutic lifestyle changes.  Fall precautions discussed with patient. Follow up as planned and earlier as needed.  Return to clinic for lab work. Take the new medicine prescribed, Protonix, one half hour before breakfast daily after lab work is drawn. If eating and swallowing problems continue especially after starting the Protonix, please call back and let us know, and we will schedule a visit for another endoscopy and dilatation

## 2013-07-09 DIAGNOSIS — Z85828 Personal history of other malignant neoplasm of skin: Secondary | ICD-10-CM | POA: Diagnosis not present

## 2013-07-09 DIAGNOSIS — L570 Actinic keratosis: Secondary | ICD-10-CM | POA: Diagnosis not present

## 2013-07-11 ENCOUNTER — Other Ambulatory Visit: Payer: Medicare Other

## 2013-07-11 DIAGNOSIS — Z1212 Encounter for screening for malignant neoplasm of rectum: Secondary | ICD-10-CM | POA: Diagnosis not present

## 2013-07-12 LAB — FECAL OCCULT BLOOD, IMMUNOCHEMICAL: Fecal Occult Blood: POSITIVE — AB

## 2013-07-17 ENCOUNTER — Other Ambulatory Visit (INDEPENDENT_AMBULATORY_CARE_PROVIDER_SITE_OTHER): Payer: Medicare Other

## 2013-07-17 DIAGNOSIS — E559 Vitamin D deficiency, unspecified: Secondary | ICD-10-CM | POA: Diagnosis not present

## 2013-07-17 DIAGNOSIS — I10 Essential (primary) hypertension: Secondary | ICD-10-CM

## 2013-07-17 DIAGNOSIS — R569 Unspecified convulsions: Secondary | ICD-10-CM | POA: Diagnosis not present

## 2013-07-17 DIAGNOSIS — K219 Gastro-esophageal reflux disease without esophagitis: Secondary | ICD-10-CM

## 2013-07-17 DIAGNOSIS — E7800 Pure hypercholesterolemia, unspecified: Secondary | ICD-10-CM

## 2013-07-17 LAB — POCT CBC
Granulocyte percent: 68.6 %G (ref 37–80)
Hemoglobin: 14.1 g/dL (ref 14.1–18.1)
Lymph, poc: 2.2 (ref 0.6–3.4)
MCH, POC: 28.5 pg (ref 27–31.2)
MCHC: 33.5 g/dL (ref 31.8–35.4)
MPV: 6.5 fL (ref 0–99.8)
POC Granulocyte: 6.2 (ref 2–6.9)
POC LYMPH PERCENT: 24.6 %L (ref 10–50)
Platelet Count, POC: 279 10*3/uL (ref 142–424)
RDW, POC: 13.7 %

## 2013-07-17 NOTE — Progress Notes (Signed)
Labs only

## 2013-07-19 ENCOUNTER — Telehealth: Payer: Self-pay | Admitting: Family Medicine

## 2013-07-19 LAB — NMR, LIPOPROFILE
Cholesterol: 170 mg/dL (ref <200)
HDL Cholesterol by NMR: 56 mg/dL (ref >=40)
LDL Particle Number: 1234 nmol/L — ABNORMAL HIGH (ref <1000)
LDL Size: 21.1 nm (ref >20.5)
LDLC SERPL CALC-MCNC: 95 mg/dL (ref <100)
Triglycerides by NMR: 97 mg/dL (ref <150)

## 2013-07-19 LAB — BMP8+EGFR
BUN: 14 mg/dL (ref 8–27)
CO2: 25 mmol/L (ref 18–29)
Calcium: 9.7 mg/dL (ref 8.6–10.2)
Creatinine, Ser: 0.84 mg/dL (ref 0.76–1.27)
GFR calc Af Amer: 96 mL/min/{1.73_m2} (ref >59)
GFR calc non Af Amer: 83 mL/min/{1.73_m2} (ref >59)
Glucose: 109 mg/dL — ABNORMAL HIGH (ref 65–99)
Potassium: 5.1 mmol/L (ref 3.5–5.2)

## 2013-07-19 LAB — PHENOBARBITAL LEVEL: Phenobarbital Lvl: 28 ug/mL (ref 15–40)

## 2013-07-19 LAB — HEPATIC FUNCTION PANEL
ALT: 18 IU/L (ref 0–44)
Bilirubin, Direct: 0.11 mg/dL (ref 0.00–0.40)
Total Protein: 6.7 g/dL (ref 6.0–8.5)

## 2013-07-19 LAB — PHENYTOIN LEVEL, FREE
Phenytoin, Free: 0.6 ug/mL — ABNORMAL LOW (ref 1.0–2.0)
Phenytoin: 6.1 ug/mL — ABNORMAL LOW (ref 10.0–20.0)

## 2013-07-19 LAB — VITAMIN D 25 HYDROXY (VIT D DEFICIENCY, FRACTURES): Vit D, 25-Hydroxy: 38.5 ng/mL (ref 30.0–100.0)

## 2013-07-22 ENCOUNTER — Other Ambulatory Visit: Payer: Self-pay | Admitting: *Deleted

## 2013-07-22 DIAGNOSIS — R195 Other fecal abnormalities: Secondary | ICD-10-CM

## 2013-07-22 NOTE — Telephone Encounter (Signed)
Pt called about labs  

## 2013-07-30 ENCOUNTER — Telehealth: Payer: Self-pay | Admitting: Family Medicine

## 2013-07-30 NOTE — Telephone Encounter (Signed)
Pt aware FOBT is at front

## 2013-07-30 NOTE — Telephone Encounter (Signed)
aware

## 2013-08-02 ENCOUNTER — Other Ambulatory Visit (INDEPENDENT_AMBULATORY_CARE_PROVIDER_SITE_OTHER): Payer: Self-pay

## 2013-08-02 DIAGNOSIS — Z1212 Encounter for screening for malignant neoplasm of rectum: Secondary | ICD-10-CM | POA: Diagnosis not present

## 2013-08-02 NOTE — Progress Notes (Signed)
Patient dropped off fobt 

## 2013-08-06 ENCOUNTER — Encounter: Payer: Self-pay | Admitting: *Deleted

## 2013-08-06 NOTE — Progress Notes (Signed)
Quick Note:  Copy of labs sent to patient ______ 

## 2013-09-09 ENCOUNTER — Telehealth: Payer: Self-pay | Admitting: Family Medicine

## 2013-09-10 ENCOUNTER — Other Ambulatory Visit: Payer: Self-pay | Admitting: Family Medicine

## 2013-09-10 MED ORDER — PHENYTOIN SODIUM EXTENDED 100 MG PO CAPS: ORAL_CAPSULE | ORAL | Status: DC

## 2013-10-09 DIAGNOSIS — Z85828 Personal history of other malignant neoplasm of skin: Secondary | ICD-10-CM | POA: Diagnosis not present

## 2013-10-11 ENCOUNTER — Encounter: Payer: Self-pay | Admitting: Internal Medicine

## 2013-10-22 DIAGNOSIS — R3915 Urgency of urination: Secondary | ICD-10-CM | POA: Diagnosis not present

## 2013-10-22 DIAGNOSIS — N138 Other obstructive and reflux uropathy: Secondary | ICD-10-CM | POA: Diagnosis not present

## 2013-10-22 DIAGNOSIS — C61 Malignant neoplasm of prostate: Secondary | ICD-10-CM | POA: Diagnosis not present

## 2013-11-11 ENCOUNTER — Ambulatory Visit (INDEPENDENT_AMBULATORY_CARE_PROVIDER_SITE_OTHER): Payer: Medicare Other | Admitting: Family Medicine

## 2013-11-11 ENCOUNTER — Encounter: Payer: Self-pay | Admitting: Family Medicine

## 2013-11-11 VITALS — BP 140/64 | HR 72 | Temp 97.5°F | Ht 71.25 in | Wt 194.0 lb

## 2013-11-11 DIAGNOSIS — C61 Malignant neoplasm of prostate: Secondary | ICD-10-CM

## 2013-11-11 DIAGNOSIS — K219 Gastro-esophageal reflux disease without esophagitis: Secondary | ICD-10-CM | POA: Diagnosis not present

## 2013-11-11 DIAGNOSIS — R569 Unspecified convulsions: Secondary | ICD-10-CM

## 2013-11-11 DIAGNOSIS — E7800 Pure hypercholesterolemia, unspecified: Secondary | ICD-10-CM | POA: Diagnosis not present

## 2013-11-11 DIAGNOSIS — E8881 Metabolic syndrome: Secondary | ICD-10-CM

## 2013-11-11 DIAGNOSIS — I10 Essential (primary) hypertension: Secondary | ICD-10-CM | POA: Diagnosis not present

## 2013-11-11 DIAGNOSIS — E559 Vitamin D deficiency, unspecified: Secondary | ICD-10-CM

## 2013-11-11 NOTE — Patient Instructions (Addendum)
Continue current medications. Continue good therapeutic lifestyle changes which include good diet and exercise. Fall precautions discussed with patient. Schedule your flu vaccine if you haven't had it yet If you are over 79 years old - you may need Prevnar 74 or the adult Pneumonia vaccine. The patient may try Dilantin twice a day on Monday Wednesday and Friday for a month just to see if the extra Dilantin they had any help with the leg jerking at night.                            Medicare Annual Wellness Visit  [facility] and the medical providers at [facility] strive to bring you the best medical care.  In doing so we not only want to address your current medical conditions and concerns but also to detect new conditions early and prevent illness, disease and health-related problems.    Medicare offers a yearly Wellness Visit which allows our clinical staff to assess your need for preventative services including immunizations, lifestyle education, counseling to decrease risk of preventable diseases and screening for fall risk and other medical concerns.    This visit is provided free of charge (no copay) for all Medicare recipients. The clinical pharmacists at [facility] have begun to conduct these Wellness Visits which will also include a thorough review of all your medications.    As you primary medical provider recommend that you make an appointment for your Annual Wellness Visit if you have not done so already this year.  You may set up this appointment before you leave today or you may call back ([phone]) and schedule an appointment.  Please make sure when you call that you mention that you are scheduling your Annual Wellness Visit with the clinical pharmacist so that the appointment may be made for the proper length of time.

## 2013-11-11 NOTE — Progress Notes (Signed)
Subjective:    Patient ID: Joshua [name], male    DOB: May 07, 1932, 80 y.o.   MRN: [id number]  HPI Pt here for follow up and management of chronic medical problems. The patient does complain of some restless leg type symptoms and the right leg at nighttime.       Patient Active Problem List   Diagnosis Date Noted  . Prostate cancer   . Hypercholesterolemia   . Hypertension   . Seizures   . GERD 08/22/2008  . COLONIC POLYPS, ADENOMATOUS, HX OF 08/22/2008   Outpatient Encounter Prescriptions as of 11/11/2013  Medication Sig  . calcium carbonate (OS-CAL) 600 MG TABS tablet Take 600 mg by mouth daily with breakfast.  . clopidogrel (PLAVIX) 75 MG tablet Take 1 tablet (75 mg total) by mouth daily.  . pantoprazole (PROTONIX) 40 MG tablet 1 daily one half hour before before breakfast  . PHENobarbital (LUMINAL) 32.4 MG tablet Take 2 tabs in am, 3 tabs pm per pt statement  . phenytoin (DILANTIN) 100 MG ER capsule 2 am, 3 at bedtime  . pravastatin (PRAVACHOL) 80 MG tablet Take 1 tablet (80 mg total) by mouth daily.  Marland Kitchen VITAMIN D, ERGOCALCIFEROL, PO Take 600 Units by mouth daily.     Review of Systems  Constitutional: Negative.   HENT: Negative.   Eyes: Negative.   Respiratory: Negative.   Cardiovascular: Negative.   Gastrointestinal: Negative.   Endocrine: Negative.   Genitourinary: Negative.   Musculoskeletal: Negative.   Skin: Negative.   Allergic/Immunologic: Negative.   Neurological: Negative.   Hematological: Negative.   Psychiatric/Behavioral: Negative.        Objective:   Physical Exam  Nursing note and vitals reviewed. Constitutional: He is oriented to person, place, and time. He appears well-developed and well-nourished. No distress.  HENT:  Head: Normocephalic and atraumatic.  Right Ear: External ear normal.  Left Ear: External ear normal.  Nose: Nose normal.  Mouth/Throat: Oropharynx is clear and moist. No oropharyngeal exudate.  Eyes: Conjunctivae and  EOM are normal. Pupils are equal, round, and reactive to light. Right eye exhibits no discharge. Left eye exhibits no discharge. No scleral icterus.  Neck: Normal range of motion. Neck supple. No thyromegaly present.  Cardiovascular: Normal rate, regular rhythm, normal heart sounds and intact distal pulses.  Exam reveals no gallop and no friction rub.   No murmur heard. At 60 per minute  Pulmonary/Chest: Effort normal and breath sounds normal. No respiratory distress. He has no wheezes. He has no rales. He exhibits no tenderness.  No chest wall masses or axillary adenopathy  Abdominal: Soft. Bowel sounds are normal. He exhibits no mass. There is no tenderness. There is no rebound and no guarding.  No inguinal adenopathy  Genitourinary:  The patient was just examined by the urologist and he has a yearly appointment scheduled with him for follow up of his prostate cancer post seed implant  Musculoskeletal: Normal range of motion. He exhibits no edema and no tenderness.  Lower extremity varicosity  Lymphadenopathy:    He has no cervical adenopathy.  Neurological: He is alert and oriented to person, place, and time. He has normal reflexes. No cranial nerve deficit.  Skin: Skin is warm and dry. No rash noted. No erythema. No pallor.  Psychiatric: He has a normal mood and affect. His behavior is normal. Judgment and thought content normal.   BP 140/64  Pulse 72  Temp(Src) 97.5 F (36.4 C) (Oral)  Ht 5' 11.25" (1.81 m)  Wt 194 lb (87.998 kg)  BMI 26.86 kg/m2        Assessment & Plan:   1. Hypertension - POCT CBC; Future - BMP8+EGFR; Future - Hepatic function panel; Future  2. Hypercholesterolemia - POCT CBC; Future - NMR, lipoprofile; Future  3. Prostate cancer - POCT CBC; Future -Yearly followup with the urologist  4. GERD - POCT CBC; Future  5. Metabolic syndrome - POCT glycosylated hemoglobin (Hb A1C); Future  6. Vitamin D deficiency - Vit D  25 hydroxy (rtn  osteoporosis monitoring); Future  7. Seizures -Continue medication as directed  Patient Instructions  Continue current medications. Continue good therapeutic lifestyle changes which include good diet and exercise. Fall precautions discussed with patient. Schedule your flu vaccine if you haven't had it yet If you are over 42 years old - you may need Prevnar 37 or the adult Pneumonia vaccine. The patient may try Dilantin twice a day on Monday Wednesday and Friday for a month just to see if the extra Dilantin they had any help with the leg jerking at night.                            Medicare Annual Wellness Visit  [facility] and the medical providers at [facility] strive to bring you the best medical care.  In doing so we not only want to address your current medical conditions and concerns but also to detect new conditions early and prevent illness, disease and health-related problems.    Medicare offers a yearly Wellness Visit which allows our clinical staff to assess your need for preventative services including immunizations, lifestyle education, counseling to decrease risk of preventable diseases and screening for fall risk and other medical concerns.    This visit is provided free of charge (no copay) for all Medicare recipients. The clinical pharmacists at [facility] have begun to conduct these Wellness Visits which will also include a thorough review of all your medications.    As you primary medical provider recommend that you make an appointment for your Annual Wellness Visit if you have not done so already this year.  You may set up this appointment before you leave today or you may call back ([phone]) and schedule an appointment.  Please make sure when you call that you mention that you are scheduling your Annual Wellness Visit with the clinical pharmacist so that the appointment may be made for the proper length of time.      Arrie Senate MD

## 2013-11-18 ENCOUNTER — Other Ambulatory Visit: Payer: Self-pay | Admitting: *Deleted

## 2013-11-18 DIAGNOSIS — R269 Unspecified abnormalities of gait and mobility: Secondary | ICD-10-CM

## 2013-11-25 ENCOUNTER — Other Ambulatory Visit (INDEPENDENT_AMBULATORY_CARE_PROVIDER_SITE_OTHER): Payer: Medicare Other

## 2013-11-25 DIAGNOSIS — I10 Essential (primary) hypertension: Secondary | ICD-10-CM

## 2013-11-25 DIAGNOSIS — K219 Gastro-esophageal reflux disease without esophagitis: Secondary | ICD-10-CM

## 2013-11-25 DIAGNOSIS — E559 Vitamin D deficiency, unspecified: Secondary | ICD-10-CM | POA: Diagnosis not present

## 2013-11-25 DIAGNOSIS — E7800 Pure hypercholesterolemia, unspecified: Secondary | ICD-10-CM

## 2013-11-25 DIAGNOSIS — E8881 Metabolic syndrome: Secondary | ICD-10-CM | POA: Diagnosis not present

## 2013-11-25 DIAGNOSIS — C61 Malignant neoplasm of prostate: Secondary | ICD-10-CM | POA: Diagnosis not present

## 2013-11-25 LAB — POCT CBC
GRANULOCYTE PERCENT: 75.1 % (ref 37–80)
HCT, POC: 44.1 % (ref 43.5–53.7)
Hemoglobin: 14.3 g/dL (ref 14.1–18.1)
LYMPH, POC: 1.9 (ref 0.6–3.4)
MCH: 28.6 pg (ref 27–31.2)
MCHC: 32.6 g/dL (ref 31.8–35.4)
MCV: 87.8 fL (ref 80–97)
MPV: 7.2 fL (ref 0–99.8)
PLATELET COUNT, POC: 277 10*3/uL (ref 142–424)
POC Granulocyte: 6.9 (ref 2–6.9)
POC LYMPH PERCENT: 20.6 %L (ref 10–50)
RBC: 5 M/uL (ref 4.69–6.13)
RDW, POC: 13.4 %
WBC: 9.2 10*3/uL (ref 4.6–10.2)

## 2013-11-25 LAB — POCT GLYCOSYLATED HEMOGLOBIN (HGB A1C): Hemoglobin A1C: 5.3

## 2013-11-27 LAB — NMR, LIPOPROFILE
Cholesterol: 179 mg/dL (ref <200)
HDL Cholesterol by NMR: 59 mg/dL (ref >=40)
HDL PARTICLE NUMBER: 30.2 umol/L — AB (ref >=30.5)
LDL PARTICLE NUMBER: 1128 nmol/L — AB (ref <1000)
LDL SIZE: 21.2 nm (ref >20.5)
LDLC SERPL CALC-MCNC: 101 mg/dL — ABNORMAL HIGH (ref <100)
LP-IR Score: <25 (ref <=45)
Small LDL Particle Number: 101 nmol/L (ref <=527)
TRIGLYCERIDES BY NMR: 94 mg/dL (ref <150)

## 2013-11-27 LAB — HEPATIC FUNCTION PANEL
ALBUMIN: 4.3 g/dL (ref 3.5–4.8)
ALT: 19 IU/L (ref 0–44)
AST: 21 IU/L (ref 0–40)
Alkaline Phosphatase: 103 IU/L (ref 39–117)
BILIRUBIN DIRECT: 0.11 mg/dL (ref 0.00–0.40)
TOTAL PROTEIN: 6.7 g/dL (ref 6.0–8.5)
Total Bilirubin: 0.3 mg/dL (ref 0.0–1.2)

## 2013-11-27 LAB — VITAMIN D 25 HYDROXY (VIT D DEFICIENCY, FRACTURES): Vit D, 25-Hydroxy: 43.1 ng/mL (ref 30.0–100.0)

## 2013-11-27 LAB — BMP8+EGFR
BUN/Creatinine Ratio: 14 (ref 10–22)
BUN: 13 mg/dL (ref 8–27)
CALCIUM: 9.8 mg/dL (ref 8.6–10.2)
CO2: 27 mmol/L (ref 18–29)
Chloride: 101 mmol/L (ref 97–108)
Creatinine, Ser: 0.92 mg/dL (ref 0.76–1.27)
GFR calc Af Amer: 91 mL/min/{1.73_m2} (ref >59)
GFR, EST NON AFRICAN AMERICAN: 79 mL/min/{1.73_m2} (ref >59)
GLUCOSE: 107 mg/dL — AB (ref 65–99)
POTASSIUM: 5.1 mmol/L (ref 3.5–5.2)
Sodium: 142 mmol/L (ref 134–144)

## 2013-12-05 ENCOUNTER — Telehealth: Payer: Self-pay | Admitting: Family Medicine

## 2013-12-05 MED ORDER — PHENOBARBITAL 32.4 MG PO TABS: ORAL_TABLET | ORAL | Status: DC

## 2013-12-05 NOTE — Telephone Encounter (Signed)
rx ready for pickup 

## 2013-12-06 NOTE — Telephone Encounter (Signed)
Pt aware rx ready

## 2013-12-18 DIAGNOSIS — H01009 Unspecified blepharitis unspecified eye, unspecified eyelid: Secondary | ICD-10-CM | POA: Diagnosis not present

## 2013-12-18 DIAGNOSIS — H5200 Hypermetropia, unspecified eye: Secondary | ICD-10-CM | POA: Diagnosis not present

## 2013-12-18 DIAGNOSIS — H04129 Dry eye syndrome of unspecified lacrimal gland: Secondary | ICD-10-CM | POA: Diagnosis not present

## 2013-12-18 DIAGNOSIS — H259 Unspecified age-related cataract: Secondary | ICD-10-CM | POA: Diagnosis not present

## 2013-12-30 ENCOUNTER — Other Ambulatory Visit: Payer: Self-pay | Admitting: *Deleted

## 2013-12-30 MED ORDER — PRAVASTATIN SODIUM 80 MG PO TABS: 80.0000 mg | ORAL_TABLET | Freq: Every day | ORAL | Status: DC

## 2014-01-08 DIAGNOSIS — L570 Actinic keratosis: Secondary | ICD-10-CM | POA: Diagnosis not present

## 2014-03-24 ENCOUNTER — Other Ambulatory Visit: Payer: Self-pay | Admitting: Family Medicine

## 2014-03-24 MED ORDER — PHENYTOIN SODIUM EXTENDED 100 MG PO CAPS: ORAL_CAPSULE | ORAL | Status: DC

## 2014-03-25 MED ORDER — PHENOBARBITAL 32.4 MG PO TABS: ORAL_TABLET | ORAL | Status: DC

## 2014-03-25 MED ORDER — PHENYTOIN SODIUM EXTENDED 100 MG PO CAPS: ORAL_CAPSULE | ORAL | Status: DC

## 2014-03-25 NOTE — Telephone Encounter (Signed)
Please confirm the dosage on the phenobarbital before it is prescribed

## 2014-05-12 ENCOUNTER — Encounter: Payer: Self-pay | Admitting: Family Medicine

## 2014-05-12 ENCOUNTER — Ambulatory Visit (INDEPENDENT_AMBULATORY_CARE_PROVIDER_SITE_OTHER): Payer: Medicare Other

## 2014-05-12 ENCOUNTER — Ambulatory Visit (INDEPENDENT_AMBULATORY_CARE_PROVIDER_SITE_OTHER): Payer: Medicare Other | Admitting: Family Medicine

## 2014-05-12 VITALS — BP 144/76 | HR 62 | Temp 96.8°F | Ht 71.25 in | Wt 195.0 lb

## 2014-05-12 DIAGNOSIS — I10 Essential (primary) hypertension: Secondary | ICD-10-CM | POA: Diagnosis not present

## 2014-05-12 DIAGNOSIS — E559 Vitamin D deficiency, unspecified: Secondary | ICD-10-CM

## 2014-05-12 DIAGNOSIS — R569 Unspecified convulsions: Secondary | ICD-10-CM | POA: Diagnosis not present

## 2014-05-12 DIAGNOSIS — C61 Malignant neoplasm of prostate: Secondary | ICD-10-CM | POA: Diagnosis not present

## 2014-05-12 DIAGNOSIS — E7800 Pure hypercholesterolemia, unspecified: Secondary | ICD-10-CM | POA: Diagnosis not present

## 2014-05-12 DIAGNOSIS — K219 Gastro-esophageal reflux disease without esophagitis: Secondary | ICD-10-CM

## 2014-05-12 DIAGNOSIS — R9389 Abnormal findings on diagnostic imaging of other specified body structures: Secondary | ICD-10-CM

## 2014-05-12 MED ORDER — CLOPIDOGREL BISULFATE 75 MG PO TABS: 75.0000 mg | ORAL_TABLET | Freq: Every day | ORAL | Status: DC

## 2014-05-12 NOTE — Progress Notes (Addendum)
Subjective:    Patient ID: Joshua [name], male    DOB: 20-May-1932, 80 y.o.   MRN: [id number]  HPI Pt here for follow up and management of chronic medical problems. The patient is doing well with no complaints. He does request a refill on his Plavix. He will get a chest x-ray today. He has prostate cancer and is being followed by the urologist. He will return to the office fasting for lab work and drug levels for his seizure medication.       Patient Active Problem List   Diagnosis Date Noted  . Prostate cancer   . Hypercholesterolemia   . Hypertension   . Seizures   . GERD 08/22/2008  . COLONIC POLYPS, ADENOMATOUS, HX OF 08/22/2008   Outpatient Encounter Prescriptions as of 05/12/2014  Medication Sig  . calcium carbonate (OS-CAL) 600 MG TABS tablet Take 600 mg by mouth daily with breakfast.  . clopidogrel (PLAVIX) 75 MG tablet Take 1 tablet (75 mg total) by mouth daily.  . pantoprazole (PROTONIX) 40 MG tablet 1 daily one half hour before before breakfast  . PHENobarbital (LUMINAL) 32.4 MG tablet Take 2 tabs in am, 3 tabs pm per pt statement  . phenytoin (DILANTIN) 100 MG ER capsule 2 am, 3 at bedtime  . pravastatin (PRAVACHOL) 80 MG tablet Take 1 tablet (80 mg total) by mouth daily.  Marland Kitchen VITAMIN D, ERGOCALCIFEROL, PO Take 600 Units by mouth daily.     Review of Systems  Constitutional: Negative.   HENT: Negative.   Eyes: Negative.   Respiratory: Negative.   Cardiovascular: Negative.   Gastrointestinal: Negative.   Endocrine: Negative.   Genitourinary: Negative.   Musculoskeletal: Negative.   Skin: Negative.   Allergic/Immunologic: Negative.   Neurological: Negative.   Hematological: Negative.   Psychiatric/Behavioral: Negative.        Objective:   Physical Exam  Nursing note and vitals reviewed. Constitutional: He is oriented to person, place, and time. He appears well-developed and well-nourished. No distress.  The patient is alert and positive   HENT:    Head: Normocephalic and atraumatic.  Right Ear: External ear normal.  Left Ear: External ear normal.  Nose: Nose normal.  Mouth/Throat: Oropharynx is clear and moist. No oropharyngeal exudate.  There is slight asymmetry in his face and this has been there for years. The left side appears weaker than the right.  Eyes: Conjunctivae and EOM are normal. Pupils are equal, round, and reactive to light. Right eye exhibits no discharge. Left eye exhibits no discharge. No scleral icterus.  Neck: Normal range of motion. Neck supple. No thyromegaly present.   There was no thyromegaly or carotid bruits  Cardiovascular: Normal rate, regular rhythm, normal heart sounds and intact distal pulses.  Exam reveals no gallop and no friction rub.   No murmur heard. At 72 per minute  Pulmonary/Chest: Effort normal and breath sounds normal. No respiratory distress. He has no wheezes. He has no rales. He exhibits no tenderness.  The axillary region was negative for masses  Abdominal: Soft. Bowel sounds are normal. He exhibits no mass. There is no tenderness. There is no rebound and no guarding.  There was no abdominal tenderness or abdominal bruits  Musculoskeletal: Normal range of motion. He exhibits no edema and no tenderness.  Lymphadenopathy:    He has no cervical adenopathy.  Neurological: He is alert and oriented to person, place, and time. He has normal reflexes. No cranial nerve deficit.  Skin: Skin is warm and  dry. No rash noted. No erythema. No pallor.  Psychiatric: He has a normal mood and affect. His behavior is normal. Judgment and thought content normal.   BP 144/76  Pulse 62  Temp(Src) 96.8 F (36 C) (Oral)  Ht 5' 11.25" (1.81 m)  Wt 195 lb (88.451 kg)  BMI 27.00 kg/m2  [facility] reading (PRIMARY) by  Dr. Brunilda Payor x-ray-  no active disease                                      Assessment & Plan:  1. Hypercholesterolemia - POCT CBC; Future - NMR, lipoprofile; Future  2. Essential  hypertension - DG Chest 2 View; Future - POCT CBC; Future - BMP8+EGFR; Future - Hepatic function panel; Future  3. Seizures - POCT CBC; Future - Phenobarbital level; Future - Phenytoin level, total and free; Future  4. Prostate cancer - POCT CBC; Future -Keep appointment with urology  5. Gastroesophageal reflux disease, esophagitis presence not specified - POCT CBC; Future  6. Vitamin D deficiency - Vit D  25 hydroxy (rtn osteoporosis monitoring); Future  7. Abnormal chest x-ray -Chest x-ray today  Meds ordered this encounter  Medications  . clopidogrel (PLAVIX) 75 MG tablet    Sig: Take 1 tablet (75 mg total) by mouth daily.    Dispense:  90 tablet    Refill:  3   Patient Instructions                       Medicare Annual Wellness Visit  [facility] and the medical providers at [facility] strive to bring you the best medical care.  In doing so we not only want to address your current medical conditions and concerns but also to detect new conditions early and prevent illness, disease and health-related problems.    Medicare offers a yearly Wellness Visit which allows our clinical staff to assess your need for preventative services including immunizations, lifestyle education, counseling to decrease risk of preventable diseases and screening for fall risk and other medical concerns.    This visit is provided free of charge (no copay) for all Medicare recipients. The clinical pharmacists at [facility] have begun to conduct these Wellness Visits which will also include a thorough review of all your medications.    As you primary medical provider recommend that you make an appointment for your Annual Wellness Visit if you have not done so already this year.  You may set up this appointment before you leave today or you may call back ([phone]) and schedule an appointment.  Please make sure when you call that you mention that you are  scheduling your Annual Wellness Visit with the clinical pharmacist so that the appointment may be made for the proper length of time.     Continue current medications. Continue good therapeutic lifestyle changes which include good diet and exercise. Fall precautions discussed with patient. If an FOBT was given today- please return it to our front desk. If you are over 89 years old - you may need Prevnar 7 or the adult Pneumonia vaccine.  Flu Shots will be available at our office starting mid- September. Please call and schedule a FLU CLINIC APPOINTMENT.   Try to get out more and be with people  Return to clinic for lab work We will call you with the results of a chest x-ray  once those results are available Keep appointment with urologist as planned   Arrie Senate MD

## 2014-05-12 NOTE — Patient Instructions (Addendum)
Medicare Annual Wellness Visit  [facility] and the medical providers at [facility] strive to bring you the best medical care.  In doing so we not only want to address your current medical conditions and concerns but also to detect new conditions early and prevent illness, disease and health-related problems.    Medicare offers a yearly Wellness Visit which allows our clinical staff to assess your need for preventative services including immunizations, lifestyle education, counseling to decrease risk of preventable diseases and screening for fall risk and other medical concerns.    This visit is provided free of charge (no copay) for all Medicare recipients. The clinical pharmacists at [facility] have begun to conduct these Wellness Visits which will also include a thorough review of all your medications.    As you primary medical provider recommend that you make an appointment for your Annual Wellness Visit if you have not done so already this year.  You may set up this appointment before you leave today or you may call back ([phone]) and schedule an appointment.  Please make sure when you call that you mention that you are scheduling your Annual Wellness Visit with the clinical pharmacist so that the appointment may be made for the proper length of time.     Continue current medications. Continue good therapeutic lifestyle changes which include good diet and exercise. Fall precautions discussed with patient. If an FOBT was given today- please return it to our front desk. If you are over 61 years old - you may need Prevnar 76 or the adult Pneumonia vaccine.  Flu Shots will be available at our office starting mid- September. Please call and schedule a FLU CLINIC APPOINTMENT.   Try to get out more and be with people  Return to clinic for lab work We will call you with the results of a chest x-ray once those results are  available Keep appointment with urologist as planned

## 2014-05-20 ENCOUNTER — Other Ambulatory Visit: Payer: Self-pay | Admitting: Family Medicine

## 2014-05-21 ENCOUNTER — Other Ambulatory Visit (INDEPENDENT_AMBULATORY_CARE_PROVIDER_SITE_OTHER): Payer: Medicare Other

## 2014-05-21 ENCOUNTER — Telehealth: Payer: Self-pay | Admitting: Family Medicine

## 2014-05-21 DIAGNOSIS — R569 Unspecified convulsions: Secondary | ICD-10-CM

## 2014-05-21 DIAGNOSIS — C61 Malignant neoplasm of prostate: Secondary | ICD-10-CM

## 2014-05-21 DIAGNOSIS — K219 Gastro-esophageal reflux disease without esophagitis: Secondary | ICD-10-CM

## 2014-05-21 DIAGNOSIS — E559 Vitamin D deficiency, unspecified: Secondary | ICD-10-CM | POA: Diagnosis not present

## 2014-05-21 DIAGNOSIS — I10 Essential (primary) hypertension: Secondary | ICD-10-CM

## 2014-05-21 DIAGNOSIS — E7800 Pure hypercholesterolemia, unspecified: Secondary | ICD-10-CM

## 2014-05-21 LAB — POCT CBC
Granulocyte percent: 74.2 %G (ref 37–80)
HEMATOCRIT: 44.8 % (ref 43.5–53.7)
Hemoglobin: 14.7 g/dL (ref 14.1–18.1)
Lymph, poc: 2.1 (ref 0.6–3.4)
MCH, POC: 29.1 pg (ref 27–31.2)
MCHC: 32.9 g/dL (ref 31.8–35.4)
MCV: 88.5 fL (ref 80–97)
MPV: 7.6 fL (ref 0–99.8)
PLATELET COUNT, POC: 277 10*3/uL (ref 142–424)
POC Granulocyte: 6.8 (ref 2–6.9)
POC LYMPH PERCENT: 23.4 %L (ref 10–50)
RBC: 5.1 M/uL (ref 4.69–6.13)
RDW, POC: 14 %
WBC: 9.1 10*3/uL (ref 4.6–10.2)

## 2014-05-21 NOTE — Telephone Encounter (Signed)
Pt wanted [place] to change his name in Epic but this is his legal name. Explained to pt we could not change his name. His name is incorrect on insurance card and he wanted [place] to change in Epic to match this cared.. Explained I could not do this. Pt verbalized understanding.

## 2014-05-22 LAB — HEPATIC FUNCTION PANEL
ALT: 20 IU/L (ref 0–44)
AST: 29 IU/L (ref 0–40)
Albumin: 4.7 g/dL (ref 3.5–4.7)
Alkaline Phosphatase: 120 IU/L — ABNORMAL HIGH (ref 39–117)
BILIRUBIN DIRECT: 0.14 mg/dL (ref 0.00–0.40)
Total Bilirubin: 0.5 mg/dL (ref 0.0–1.2)
Total Protein: 7.2 g/dL (ref 6.0–8.5)

## 2014-05-22 LAB — NMR, LIPOPROFILE
Cholesterol: 186 mg/dL (ref 100–199)
HDL Cholesterol by NMR: 71 mg/dL (ref >39)
HDL PARTICLE NUMBER: 33.7 umol/L (ref >=30.5)
LDL Particle Number: 1109 nmol/L — ABNORMAL HIGH (ref <1000)
LDL Size: 20.7 nm (ref >20.5)
LDLC SERPL CALC-MCNC: 99 mg/dL (ref 0–99)
LP-IR SCORE: 29 (ref <=45)
SMALL LDL PARTICLE NUMBER: 418 nmol/L (ref <=527)
Triglycerides by NMR: 82 mg/dL (ref 0–149)

## 2014-05-22 LAB — BMP8+EGFR
BUN / CREAT RATIO: 19 (ref 10–22)
BUN: 18 mg/dL (ref 8–27)
CO2: 24 mmol/L (ref 18–29)
CREATININE: 0.97 mg/dL (ref 0.76–1.27)
Calcium: 9.8 mg/dL (ref 8.6–10.2)
Chloride: 100 mmol/L (ref 97–108)
GFR calc non Af Amer: 73 mL/min/{1.73_m2} (ref >59)
GFR, EST AFRICAN AMERICAN: 85 mL/min/{1.73_m2} (ref >59)
Glucose: 111 mg/dL — ABNORMAL HIGH (ref 65–99)
Potassium: 4.9 mmol/L (ref 3.5–5.2)
Sodium: 141 mmol/L (ref 134–144)

## 2014-05-22 LAB — PHENYTOIN LEVEL, FREE
PHENYTOIN FREE: 0.8 ug/mL — AB (ref 1.0–2.0)
PHENYTOIN: 10 ug/mL (ref 10.0–20.0)

## 2014-05-22 LAB — PHENOBARBITAL LEVEL: PHENOBARBITAL LVL: 29 ug/mL (ref 15–40)

## 2014-05-22 LAB — VITAMIN D 25 HYDROXY (VIT D DEFICIENCY, FRACTURES): VIT D 25 HYDROXY: 53.9 ng/mL (ref 30.0–100.0)

## 2014-06-16 ENCOUNTER — Telehealth: Payer: Self-pay | Admitting: Family Medicine

## 2014-06-16 MED ORDER — PHENOBARBITAL 32.4 MG PO TABS: ORAL_TABLET | ORAL | Status: DC

## 2014-06-16 NOTE — Telephone Encounter (Signed)
Please refill this prescription as directed with the specific name of Burnett Spray

## 2014-06-16 NOTE — Telephone Encounter (Signed)
Rx for Phenobarbital okayed per Dr Laurance Flatten Pt notified RX is ready for pick up

## 2014-07-07 ENCOUNTER — Telehealth: Payer: Self-pay | Admitting: Family Medicine

## 2014-07-07 DIAGNOSIS — K219 Gastro-esophageal reflux disease without esophagitis: Secondary | ICD-10-CM

## 2014-07-07 NOTE — Telephone Encounter (Signed)
This is okay to refill this medication with refills for one year. He requests a 90 day supply with 3 refills and this is okay.

## 2014-07-08 ENCOUNTER — Other Ambulatory Visit: Payer: Self-pay | Admitting: Family Medicine

## 2014-07-08 MED ORDER — PANTOPRAZOLE SODIUM 40 MG PO TBEC: DELAYED_RELEASE_TABLET | ORAL | Status: DC

## 2014-07-29 ENCOUNTER — Encounter: Payer: Self-pay | Admitting: *Deleted

## 2014-09-10 ENCOUNTER — Ambulatory Visit (INDEPENDENT_AMBULATORY_CARE_PROVIDER_SITE_OTHER): Payer: Medicare Other | Admitting: Family Medicine

## 2014-09-10 ENCOUNTER — Encounter: Payer: Self-pay | Admitting: Family Medicine

## 2014-09-10 VITALS — BP 158/78 | HR 65 | Temp 97.4°F | Ht 71.25 in | Wt 195.6 lb

## 2014-09-10 DIAGNOSIS — C61 Malignant neoplasm of prostate: Secondary | ICD-10-CM

## 2014-09-10 DIAGNOSIS — I10 Essential (primary) hypertension: Secondary | ICD-10-CM | POA: Diagnosis not present

## 2014-09-10 DIAGNOSIS — Z23 Encounter for immunization: Secondary | ICD-10-CM | POA: Diagnosis not present

## 2014-09-10 DIAGNOSIS — R350 Frequency of micturition: Secondary | ICD-10-CM

## 2014-09-10 DIAGNOSIS — E559 Vitamin D deficiency, unspecified: Secondary | ICD-10-CM

## 2014-09-10 DIAGNOSIS — E7800 Pure hypercholesterolemia, unspecified: Secondary | ICD-10-CM

## 2014-09-10 DIAGNOSIS — R569 Unspecified convulsions: Secondary | ICD-10-CM

## 2014-09-10 DIAGNOSIS — K219 Gastro-esophageal reflux disease without esophagitis: Secondary | ICD-10-CM | POA: Diagnosis not present

## 2014-09-10 DIAGNOSIS — R300 Dysuria: Secondary | ICD-10-CM

## 2014-09-10 LAB — POCT CBC
Granulocyte percent: 68.2 %G (ref 37–80)
HEMATOCRIT: 40 % — AB (ref 43.5–53.7)
HEMOGLOBIN: 13 g/dL — AB (ref 14.1–18.1)
Lymph, poc: 2.2 (ref 0.6–3.4)
MCH, POC: 28.8 pg (ref 27–31.2)
MCHC: 32.5 g/dL (ref 31.8–35.4)
MCV: 88.7 fL (ref 80–97)
MPV: 7.2 fL (ref 0–99.8)
POC Granulocyte: 6.5 (ref 2–6.9)
POC LYMPH PERCENT: 22.8 %L (ref 10–50)
Platelet Count, POC: 278 10*3/uL (ref 142–424)
RBC: 4.5 M/uL — AB (ref 4.69–6.13)
RDW, POC: 13.5 %
WBC: 9.5 10*3/uL (ref 4.6–10.2)

## 2014-09-10 LAB — POCT URINALYSIS DIPSTICK
Bilirubin, UA: NEGATIVE
Blood, UA: NEGATIVE
GLUCOSE UA: NEGATIVE
Ketones, UA: NEGATIVE
Leukocytes, UA: NEGATIVE
NITRITE UA: NEGATIVE
Protein, UA: NEGATIVE
Spec Grav, UA: <=1.005
Urobilinogen, UA: NEGATIVE
pH, UA: 6.5

## 2014-09-10 LAB — POCT UA - MICROSCOPIC ONLY
Bacteria, U Microscopic: NEGATIVE
Casts, Ur, LPF, POC: NEGATIVE
Crystals, Ur, HPF, POC: NEGATIVE
Mucus, UA: NEGATIVE
RBC, urine, microscopic: NEGATIVE
WBC, Ur, HPF, POC: NEGATIVE
Yeast, UA: NEGATIVE

## 2014-09-10 MED ORDER — SULFAMETHOXAZOLE-TRIMETHOPRIM 800-160 MG PO TABS: 1.0000 | ORAL_TABLET | Freq: Two times a day (BID) | ORAL | Status: DC

## 2014-09-10 NOTE — Patient Instructions (Addendum)
Medicare Annual Wellness Visit  [facility] and the medical providers at [facility] strive to bring you the best medical care.  In doing so we not only want to address your current medical conditions and concerns but also to detect new conditions early and prevent illness, disease and health-related problems.    Medicare offers a yearly Wellness Visit which allows our clinical staff to assess your need for preventative services including immunizations, lifestyle education, counseling to decrease risk of preventable diseases and screening for fall risk and other medical concerns.    This visit is provided free of charge (no copay) for all Medicare recipients. The clinical pharmacists at [facility] have begun to conduct these Wellness Visits which will also include a thorough review of all your medications.    As you primary medical provider recommend that you make an appointment for your Annual Wellness Visit if you have not done so already this year.  You may set up this appointment before you leave today or you may call back ([phone]) and schedule an appointment.  Please make sure when you call that you mention that you are scheduling your Annual Wellness Visit with the clinical pharmacist so that the appointment may be made for the proper length of time.     Continue current medications. Continue good therapeutic lifestyle changes which include good diet and exercise. Fall precautions discussed with patient. If an FOBT was given today- please return it to our front desk. If you are over 83 years old - you may need Prevnar 71 or the adult Pneumonia vaccine.  Flu Shots will be available at our office starting mid- September. Please call and schedule a FLU CLINIC APPOINTMENT.   Continue to drink plenty of fluids Take antibiotic as directed twice daily with food.

## 2014-09-10 NOTE — Progress Notes (Signed)
Subjective:    Patient ID: Joshua [name], male    DOB: 01-Mar-1932, 80 y.o.   MRN: [id number]  HPI Pt here for follow up and management of chronic medical problems. The patient is doing well with no particular complaints. He is being followed by the urologist for his prostate cancer. He has this remote seizure disorder and is currently taking antiseizure medications which we monitor periodically. He is due to return an FOBT and we'll get a traditional lab work panel today. He will also get his flu shot today. He does complain of some frequent urination issues with stinging and burning. He is okay with refills on his current medicine.          Patient Active Problem List   Diagnosis Date Noted  . Prostate cancer   . Hypercholesterolemia   . Hypertension   . Seizures   . GERD 08/22/2008  . COLONIC POLYPS, ADENOMATOUS, HX OF 08/22/2008   Outpatient Encounter Prescriptions as of 09/10/2014  Medication Sig  . calcium carbonate (OS-CAL) 600 MG TABS tablet Take 600 mg by mouth daily with breakfast.  . clopidogrel (PLAVIX) 75 MG tablet TAKE 1 TABLET BY MOUTH DAILY  . pantoprazole (PROTONIX) 40 MG tablet TAKE 1 TABLET ONCE DAILY 30 MINUTES BEFORE MEALS  . PHENobarbital (LUMINAL) 32.4 MG tablet Take 2 tabs in am, 3 tabs pm per pt statement (Patient taking differently: Take 32.4 mg by mouth 2 (two) times daily. Take 2 tabs in am, 3 tabs pm per pt statement)  . phenytoin (DILANTIN) 100 MG ER capsule 2 am, 3 at bedtime (Patient taking differently: 2 am, 2 at bedtime)  . pravastatin (PRAVACHOL) 80 MG tablet Take 1 tablet (80 mg total) by mouth daily.  Marland Kitchen VITAMIN D, ERGOCALCIFEROL, PO Take 600 Units by mouth daily.     Review of Systems  Constitutional: Negative.   HENT: Negative.   Eyes: Negative.   Respiratory: Negative.   Cardiovascular: Negative.   Gastrointestinal: Negative.   Endocrine: Negative.   Genitourinary: Negative.   Musculoskeletal: Negative.   Skin: Negative.     Allergic/Immunologic: Negative.   Neurological: Negative.   Hematological: Negative.   Psychiatric/Behavioral: Negative.        Objective:   Physical Exam  Constitutional: He is oriented to person, place, and time. He appears well-developed and well-nourished. No distress.  The patient is alert and doing well for his age. His only complaint as indicated is his frequency and some burning with voiding.  HENT:  Head: Normocephalic and atraumatic.  Right Ear: External ear normal.  Left Ear: External ear normal.  Nose: Nose normal.  Mouth/Throat: Oropharynx is clear and moist. No oropharyngeal exudate.  Eyes: Conjunctivae and EOM are normal. Pupils are equal, round, and reactive to light. Right eye exhibits no discharge. Left eye exhibits no discharge. No scleral icterus.  Neck: Normal range of motion. Neck supple. No thyromegaly present.  Without bruits or thyromegaly  Cardiovascular: Normal rate, regular rhythm and normal heart sounds.   No murmur heard. At 72/m  Pulmonary/Chest: Effort normal and breath sounds normal. No respiratory distress. He has no wheezes. He has no rales. He exhibits no tenderness.  No axillary adenopathy  Abdominal: Soft. Bowel sounds are normal. He exhibits no mass. There is no tenderness. There is no rebound and no guarding.  No inguinal nodes or masses.  Genitourinary: Rectum normal, prostate normal and penis normal.  Rectally and external genitalia wise everything appeared normal. There was no inguinal hernia palpable.  There was no rectal masses. The prostate vault was  smooth.  Musculoskeletal: Normal range of motion. He exhibits no edema or tenderness.  Lymphadenopathy:    He has no cervical adenopathy.  Neurological: He is alert and oriented to person, place, and time.  Skin: Skin is warm and dry. No rash noted. No erythema. No pallor.  Psychiatric: He has a normal mood and affect. His behavior is normal. Judgment and thought content normal.  Nursing  note and vitals reviewed.  BP 158/78 mmHg  Pulse 65  Temp(Src) 97.4 F (36.3 C) (Oral)  Ht 5' 11.25" (1.81 m)  Wt 195 lb 9.6 oz (88.724 kg)  BMI 27.08 kg/m2  Results for orders placed or performed in visit on 09/10/14  POCT UA - Microscopic Only  Result Value Ref Range   WBC, Ur, HPF, POC NEG    RBC, urine, microscopic NEG    Bacteria, U Microscopic NEG    Mucus, UA NEG    Epithelial cells, urine per micros OCC    Crystals, Ur, HPF, POC NEG    Casts, Ur, LPF, POC NEG    Yeast, UA NEG   POCT urinalysis dipstick  Result Value Ref Range   Color, UA YELLOW    Clarity, UA CLEAR    Glucose, UA NEG    Bilirubin, UA NEG    Ketones, UA NEG    Spec Grav, UA <=1.005    Blood, UA NEG    pH, UA 6.5    Protein, UA NEG    Urobilinogen, UA negative    Nitrite, UA NEG    Leukocytes, UA Negative   POCT CBC  Result Value Ref Range   WBC 9.5 4.6 - 10.2 K/uL   Lymph, poc 2.2 0.6 - 3.4   POC LYMPH PERCENT 22.8 10 - 50 %L   POC Granulocyte 6.5 2 - 6.9   Granulocyte percent 68.2 37 - 80 %G   RBC 4.5 (A) 4.69 - 6.13 M/uL   Hemoglobin 13.0 (A) 14.1 - 18.1 g/dL   HCT, POC 40.0 (A) 43.5 - 53.7 %   MCV 88.7 80 - 97 fL   MCH, POC 28.8 27 - 31.2 pg   MCHC 32.5 31.8 - 35.4 g/dL   RDW, POC 13.5 %   Platelet Count, POC 278.0 142 - 424 K/uL   MPV 7.2 0 - 99.8 fL          Assessment & Plan:  1. Hypercholesterolemia - POCT CBC - Lipid panel  2. Essential hypertension - BMP8+EGFR - Hepatic function panel - POCT CBC - Lipid panel  3. Prostate cancer - POCT UA - Microscopic Only - POCT urinalysis dipstick - PSA, total and free - POCT CBC -Patient has follow-up appointment with Dr. [name] in February 2016  4. Vitamin D deficiency - POCT CBC  5. Gastroesophageal reflux disease, esophagitis presence not specified - Hepatic function panel - POCT CBC  6. Frequent urination - POCT UA - Microscopic Only - POCT urinalysis dipstick - Urine culture  7. Seizures -Stable  8.  Dysuria  Meds ordered this encounter  Medications  . sulfamethoxazole-trimethoprim (BACTRIM DS,SEPTRA DS) 800-160 MG per tablet    Sig: Take 1 tablet by mouth 2 (two) times daily.    Dispense:  14 tablet    Refill:  0   Patient Instructions                       Medicare Annual Wellness Visit  [facility] and the medical providers at [facility] strive to bring you the best medical care.  In doing so we not only want to address your current medical conditions and concerns but also to detect new conditions early and prevent illness, disease and health-related problems.    Medicare offers a yearly Wellness Visit which allows our clinical staff to assess your need for preventative services including immunizations, lifestyle education, counseling to decrease risk of preventable diseases and screening for fall risk and other medical concerns.    This visit is provided free of charge (no copay) for all Medicare recipients. The clinical pharmacists at [facility] have begun to conduct these Wellness Visits which will also include a thorough review of all your medications.    As you primary medical provider recommend that you make an appointment for your Annual Wellness Visit if you have not done so already this year.  You may set up this appointment before you leave today or you may call back ([phone]) and schedule an appointment.  Please make sure when you call that you mention that you are scheduling your Annual Wellness Visit with the clinical pharmacist so that the appointment may be made for the proper length of time.     Continue current medications. Continue good therapeutic lifestyle changes which include good diet and exercise. Fall precautions discussed with patient. If an FOBT was given today- please return it to our front desk. If you are over 18 years old - you may need Prevnar 11 or the adult Pneumonia vaccine.  Flu Shots will be  available at our office starting mid- September. Please call and schedule a FLU CLINIC APPOINTMENT.   Continue to drink plenty of fluids Take antibiotic as directed twice daily with food.   Arrie Senate MD

## 2014-09-10 NOTE — Addendum Note (Signed)
Addended by: Selmer Dominion on: 09/10/2014 12:48 PM   Modules accepted: Orders

## 2014-09-11 ENCOUNTER — Ambulatory Visit: Payer: Medicare Other | Admitting: Family Medicine

## 2014-09-11 LAB — LIPID PANEL
CHOL/HDL RATIO: 2.6 ratio (ref 0.0–5.0)
CHOLESTEROL TOTAL: 162 mg/dL (ref 100–199)
HDL: 63 mg/dL (ref >39)
LDL CALC: 78 mg/dL (ref 0–99)
Triglycerides: 105 mg/dL (ref 0–149)
VLDL CHOLESTEROL CAL: 21 mg/dL (ref 5–40)

## 2014-09-11 LAB — BMP8+EGFR
BUN/Creatinine Ratio: 25 — ABNORMAL HIGH (ref 10–22)
BUN: 20 mg/dL (ref 8–27)
CALCIUM: 9.5 mg/dL (ref 8.6–10.2)
CO2: 23 mmol/L (ref 18–29)
CREATININE: 0.81 mg/dL (ref 0.76–1.27)
Chloride: 105 mmol/L (ref 97–108)
GFR calc non Af Amer: 84 mL/min/{1.73_m2} (ref >59)
GFR, EST AFRICAN AMERICAN: 97 mL/min/{1.73_m2} (ref >59)
Glucose: 98 mg/dL (ref 65–99)
POTASSIUM: 4.6 mmol/L (ref 3.5–5.2)
Sodium: 143 mmol/L (ref 134–144)

## 2014-09-11 LAB — HEPATIC FUNCTION PANEL
ALK PHOS: 123 IU/L — AB (ref 39–117)
ALT: 18 IU/L (ref 0–44)
AST: 22 IU/L (ref 0–40)
Albumin: 4.1 g/dL (ref 3.5–4.7)
BILIRUBIN TOTAL: 0.2 mg/dL (ref 0.0–1.2)
Bilirubin, Direct: 0.07 mg/dL (ref 0.00–0.40)
Total Protein: 6.5 g/dL (ref 6.0–8.5)

## 2014-09-11 LAB — PSA, TOTAL AND FREE
PSA FREE: 0.01 ng/mL
PSA, Free Pct: >10 %
PSA: <0.1 ng/mL (ref 0.0–4.0)

## 2014-09-11 LAB — URINE CULTURE: ORGANISM ID, BACTERIA: NO GROWTH

## 2014-09-15 ENCOUNTER — Telehealth: Payer: Self-pay

## 2014-09-15 NOTE — Telephone Encounter (Signed)
Pt aware.

## 2014-09-15 NOTE — Telephone Encounter (Signed)
-----   Message from Chipper Herb, MD sent at 09/12/2014  9:40 PM EST ----- There was no growth on the urine culture--- please continue and complete antibiotic as directed

## 2014-09-17 ENCOUNTER — Other Ambulatory Visit: Payer: Self-pay | Admitting: *Deleted

## 2014-09-17 NOTE — Progress Notes (Signed)
Pt is doing better and the med that was given has helped a lot - just [employer]

## 2014-09-29 ENCOUNTER — Other Ambulatory Visit: Payer: Medicare Other

## 2014-09-29 DIAGNOSIS — Z1212 Encounter for screening for malignant neoplasm of rectum: Secondary | ICD-10-CM | POA: Diagnosis not present

## 2014-09-29 NOTE — Progress Notes (Signed)
LAB ONLY 

## 2014-10-01 DIAGNOSIS — L820 Inflamed seborrheic keratosis: Secondary | ICD-10-CM | POA: Diagnosis not present

## 2014-10-01 DIAGNOSIS — L570 Actinic keratosis: Secondary | ICD-10-CM | POA: Diagnosis not present

## 2014-10-01 LAB — FECAL OCCULT BLOOD, IMMUNOCHEMICAL: Fecal Occult Bld: NEGATIVE

## 2014-10-02 DIAGNOSIS — H04123 Dry eye syndrome of bilateral lacrimal glands: Secondary | ICD-10-CM | POA: Diagnosis not present

## 2014-10-02 DIAGNOSIS — H52203 Unspecified astigmatism, bilateral: Secondary | ICD-10-CM | POA: Diagnosis not present

## 2014-10-02 DIAGNOSIS — H2513 Age-related nuclear cataract, bilateral: Secondary | ICD-10-CM | POA: Diagnosis not present

## 2014-10-02 DIAGNOSIS — H02051 Trichiasis without entropian right upper eyelid: Secondary | ICD-10-CM | POA: Diagnosis not present

## 2014-10-14 DIAGNOSIS — H269 Unspecified cataract: Secondary | ICD-10-CM | POA: Diagnosis not present

## 2014-10-14 DIAGNOSIS — H2511 Age-related nuclear cataract, right eye: Secondary | ICD-10-CM | POA: Diagnosis not present

## 2014-10-14 DIAGNOSIS — H25011 Cortical age-related cataract, right eye: Secondary | ICD-10-CM | POA: Diagnosis not present

## 2014-10-15 ENCOUNTER — Telehealth: Payer: Self-pay | Admitting: Family Medicine

## 2014-10-15 NOTE — Telephone Encounter (Signed)
Patient aware that we can do PSA and to call and have [facility] office to fax order.

## 2014-10-21 ENCOUNTER — Other Ambulatory Visit (INDEPENDENT_AMBULATORY_CARE_PROVIDER_SITE_OTHER): Payer: Medicare Other

## 2014-10-21 DIAGNOSIS — C61 Malignant neoplasm of prostate: Secondary | ICD-10-CM

## 2014-10-21 NOTE — Progress Notes (Signed)
Lab work for Dr Malka So

## 2014-10-22 LAB — PSA, TOTAL AND FREE: PSA FREE: 0.01 ng/mL

## 2014-10-23 DIAGNOSIS — N403 Nodular prostate with lower urinary tract symptoms: Secondary | ICD-10-CM | POA: Diagnosis not present

## 2014-10-23 DIAGNOSIS — C61 Malignant neoplasm of prostate: Secondary | ICD-10-CM | POA: Diagnosis not present

## 2014-10-23 DIAGNOSIS — R339 Retention of urine, unspecified: Secondary | ICD-10-CM | POA: Diagnosis not present

## 2014-10-23 DIAGNOSIS — R350 Frequency of micturition: Secondary | ICD-10-CM | POA: Diagnosis not present

## 2014-11-24 ENCOUNTER — Telehealth: Payer: Self-pay | Admitting: *Deleted

## 2014-11-24 DIAGNOSIS — H9190 Unspecified hearing loss, unspecified ear: Secondary | ICD-10-CM

## 2014-11-24 NOTE — Telephone Encounter (Signed)
Joshua [name]   --  appt will be arranged and pt aware to listen for a call

## 2014-11-25 DIAGNOSIS — H25012 Cortical age-related cataract, left eye: Secondary | ICD-10-CM | POA: Diagnosis not present

## 2014-11-25 DIAGNOSIS — H2512 Age-related nuclear cataract, left eye: Secondary | ICD-10-CM | POA: Diagnosis not present

## 2014-11-25 DIAGNOSIS — H25812 Combined forms of age-related cataract, left eye: Secondary | ICD-10-CM | POA: Diagnosis not present

## 2014-12-04 ENCOUNTER — Telehealth: Payer: Self-pay | Admitting: Family Medicine

## 2014-12-04 MED ORDER — PHENOBARBITAL 32.4 MG PO TABS: ORAL_TABLET | ORAL | Status: DC

## 2014-12-04 MED ORDER — PHENYTOIN SODIUM EXTENDED 100 MG PO CAPS: ORAL_CAPSULE | ORAL | Status: DC

## 2014-12-04 MED ORDER — PRAVASTATIN SODIUM 80 MG PO TABS: 80.0000 mg | ORAL_TABLET | Freq: Every day | ORAL | Status: DC

## 2014-12-04 MED ORDER — PRAVASTATIN SODIUM 80 MG PO TABS
80.0000 mg | ORAL_TABLET | Freq: Every day | ORAL | Status: DC
Start: 2014-12-04 — End: 2014-12-04

## 2014-12-04 NOTE — Telephone Encounter (Signed)
Pt called and aware

## 2015-01-05 ENCOUNTER — Other Ambulatory Visit: Payer: Self-pay | Admitting: Family Medicine

## 2015-01-21 ENCOUNTER — Encounter: Payer: Self-pay | Admitting: Family Medicine

## 2015-01-21 ENCOUNTER — Ambulatory Visit (INDEPENDENT_AMBULATORY_CARE_PROVIDER_SITE_OTHER): Payer: Medicare Other

## 2015-01-21 ENCOUNTER — Ambulatory Visit (INDEPENDENT_AMBULATORY_CARE_PROVIDER_SITE_OTHER): Payer: Medicare Other | Admitting: Family Medicine

## 2015-01-21 VITALS — BP 145/73 | HR 53 | Temp 97.1°F | Ht 71.25 in | Wt 193.0 lb

## 2015-01-21 DIAGNOSIS — R0989 Other specified symptoms and signs involving the circulatory and respiratory systems: Secondary | ICD-10-CM

## 2015-01-21 DIAGNOSIS — E8881 Metabolic syndrome: Secondary | ICD-10-CM

## 2015-01-21 DIAGNOSIS — I10 Essential (primary) hypertension: Secondary | ICD-10-CM

## 2015-01-21 DIAGNOSIS — G40909 Epilepsy, unspecified, not intractable, without status epilepticus: Secondary | ICD-10-CM

## 2015-01-21 DIAGNOSIS — C61 Malignant neoplasm of prostate: Secondary | ICD-10-CM

## 2015-01-21 DIAGNOSIS — K219 Gastro-esophageal reflux disease without esophagitis: Secondary | ICD-10-CM | POA: Diagnosis not present

## 2015-01-21 DIAGNOSIS — E559 Vitamin D deficiency, unspecified: Secondary | ICD-10-CM

## 2015-01-21 DIAGNOSIS — E7800 Pure hypercholesterolemia, unspecified: Secondary | ICD-10-CM

## 2015-01-21 LAB — POCT CBC
Granulocyte percent: 65.3 %G (ref 37–80)
HEMATOCRIT: 41.9 % — AB (ref 43.5–53.7)
Hemoglobin: 13.6 g/dL — AB (ref 14.1–18.1)
Lymph, poc: 2.1 (ref 0.6–3.4)
MCH, POC: 29 pg (ref 27–31.2)
MCHC: 32.5 g/dL (ref 31.8–35.4)
MCV: 89.4 fL (ref 80–97)
MPV: 7.2 fL (ref 0–99.8)
POC GRANULOCYTE: 5.4 (ref 2–6.9)
POC LYMPH PERCENT: 25.7 %L (ref 10–50)
Platelet Count, POC: 279 10*3/uL (ref 142–424)
RBC: 4.69 M/uL (ref 4.69–6.13)
RDW, POC: 13.6 %
WBC: 8.3 10*3/uL (ref 4.6–10.2)

## 2015-01-21 MED ORDER — PANTOPRAZOLE SODIUM 40 MG PO TBEC: DELAYED_RELEASE_TABLET | ORAL | Status: DC

## 2015-01-21 NOTE — Patient Instructions (Addendum)
Medicare Annual Wellness Visit  [facility] and the medical providers at [facility] strive to bring you the best medical care.  In doing so we not only want to address your current medical conditions and concerns but also to detect new conditions early and prevent illness, disease and health-related problems.    Medicare offers a yearly Wellness Visit which allows our clinical staff to assess your need for preventative services including immunizations, lifestyle education, counseling to decrease risk of preventable diseases and screening for fall risk and other medical concerns.    This visit is provided free of charge (no copay) for all Medicare recipients. The clinical pharmacists at [facility] have begun to conduct these Wellness Visits which will also include a thorough review of all your medications.    As you primary medical provider recommend that you make an appointment for your Annual Wellness Visit if you have not done so already this year.  You may set up this appointment before you leave today or you may call back [phone]) and schedule an appointment.  Please make sure when you call that you mention that you are scheduling your Annual Wellness Visit with the clinical pharmacist so that the appointment may be made for the proper length of time.     Continue current medications. Continue good therapeutic lifestyle changes which include good diet and exercise. Fall precautions discussed with patient. If an FOBT was given today- please return it to our front desk. If you are over 58 years old - you may need Prevnar 32 or the adult Pneumonia vaccine.  Flu Shots are still available at our office. If you still haven't had one please call to set up a nurse visit to get one.   After your visit with [place] today you will receive a survey in the mail or online from [employer] regarding your care with [place]. Please take a moment to  fill this out. Your feedback is very important to [place] as you can help [place] better understand your patient needs as well as improve your experience and satisfaction. WE CARE ABOUT YOU!!!   Keep regular appointments with urology Always be careful and do not put yourself at risk for falling Please consider rescheduling for physical therapy next-door with rehabilitation to get your energy level back Drink plenty of water and do less eating at nighttime Watch sodium intake Avoid carbohydrates in the diet as much as possible

## 2015-01-21 NOTE — Progress Notes (Signed)
Subjective:    Patient ID: Joshua [name], male    DOB: 01-Aug-1932, 81 y.o.   MRN: [id number]  HPI Pt here for follow up and management of chronic medical problems which includes hypertension and hyperlipidemia. He is taking medications regularly. The patient is doing well with no specific complaints. He is followed regularly by the urologist for his prostate cancer.     Patient Active Problem List   Diagnosis Date Noted  . Prostate cancer   . Hypercholesterolemia   . Hypertension   . Seizures   . GERD 08/22/2008  . COLONIC POLYPS, ADENOMATOUS, HX OF 08/22/2008   Outpatient Encounter Prescriptions as of 01/21/2015  Medication Sig  . calcium carbonate (OS-CAL) 600 MG TABS tablet Take 600 mg by mouth daily with breakfast.  . clopidogrel (PLAVIX) 75 MG tablet TAKE 1 TABLET BY MOUTH DAILY  . Multiple Vitamins-Minerals (ICAPS AREDS FORMULA PO) Take 1 capsule by mouth 2 (two) times daily.  . pantoprazole (PROTONIX) 40 MG tablet TAKE 1 TABLET ONCE DAILY 30 MINUTES BEFORE a MEAL.  Marland Kitchen PHENobarbital (LUMINAL) 32.4 MG tablet Take 2 tabs in am, 3 tabs pm per pt statement  . phenytoin (DILANTIN) 100 MG ER capsule 2 caps in the morning and 3 caps QHS  . pravastatin (PRAVACHOL) 80 MG tablet Take 1 tablet (80 mg total) by mouth daily.  Marland Kitchen VITAMIN D, ERGOCALCIFEROL, PO Take 600 Units by mouth daily.   . [DISCONTINUED] pantoprazole (PROTONIX) 40 MG tablet TAKE 1 TABLET ONCE DAILY 30 MINUTES BEFORE MEALS  . [DISCONTINUED] sulfamethoxazole-trimethoprim (BACTRIM DS,SEPTRA DS) 800-160 MG per tablet Take 1 tablet by mouth 2 (two) times daily.  . [DISCONTINUED] sulfamethoxazole-trimethoprim (BACTRIM DS,SEPTRA DS) 800-160 MG per tablet Take 1 tablet by mouth 2 (two) times daily.   No facility-administered encounter medications on file as of 01/21/2015.      Review of Systems  Constitutional: Negative.   HENT: Negative.   Eyes: Negative.   Respiratory: Negative.   Cardiovascular: Negative.     Gastrointestinal: Negative.   Endocrine: Negative.   Genitourinary: Negative.   Musculoskeletal: Negative.   Skin: Negative.   Allergic/Immunologic: Negative.   Neurological: Negative.   Hematological: Negative.   Psychiatric/Behavioral: Negative.        Objective:   Physical Exam  Constitutional: He is oriented to person, place, and time. He appears well-developed and well-nourished. No distress.  HENT:  Head: Normocephalic and atraumatic.  Right Ear: External ear normal.  Left Ear: External ear normal.  Nose: Nose normal.  Mouth/Throat: Oropharynx is clear and moist. No oropharyngeal exudate.  Eyes: Conjunctivae and EOM are normal. Pupils are equal, round, and reactive to light. Right eye exhibits no discharge. Left eye exhibits no discharge. No scleral icterus.  Neck: Normal range of motion. Neck supple. No thyromegaly present.  No carotid bruits or anterior cervical adenopathy  Cardiovascular: Normal rate, regular rhythm, normal heart sounds and intact distal pulses.   No murmur heard. At 60/m  Pulmonary/Chest: Effort normal and breath sounds normal. No respiratory distress. He has no wheezes. He has no rales. He exhibits no tenderness.  A few basilar crackles bilaterally  Abdominal: Soft. Bowel sounds are normal. He exhibits no mass. There is no tenderness. There is no rebound and no guarding.  Musculoskeletal: Normal range of motion. He exhibits no edema or tenderness.  Lymphadenopathy:    He has no cervical adenopathy.  Neurological: He is alert and oriented to person, place, and time. He has normal reflexes. No cranial nerve  deficit.  Skin: Skin is warm and dry. No rash noted.  Psychiatric: He has a normal mood and affect. His behavior is normal. Judgment and thought content normal.  Nursing note and vitals reviewed.   BP 145/73 mmHg  Pulse 53  Temp(Src) 97.1 F (36.2 C) (Oral)  Ht 5' 11.25" (1.81 m)  Wt 193 lb (87.544 kg)  BMI 26.72 kg/m2  [facility] reading  (PRIMARY) by  Dr. Brunilda Payor x-ray--  emphysema changes no active disease                                    Assessment & Plan:  1. Hypercholesterolemia -The patient will continue with current treatment pending results of lab work - POCT CBC - NMR, lipoprofile  2. Essential hypertension -The blood pressure is slightly elevated systolically today and we will continue to monitor this and he will try to eat less sodium. - POCT CBC - BMP8+EGFR - Hepatic function panel  3. Prostate cancer -He has a follow-up visit scheduled with the urologist today. He has a history of having seed implants - POCT CBC - PSA, total and free  4. Vitamin D deficiency -He will continue his current vitamin D dose pending results of lab work - POCT CBC - Vit D  25 hydroxy (rtn osteoporosis monitoring)  5. Gastroesophageal reflux disease, esophagitis presence not specified -He has no complaints with his reflux problems and he will continue with his pantoprazole - POCT CBC - Hepatic function panel  6. Metabolic syndrome -He is not been very active this summer and is encouraged [facility]. - POCT CBC - BMP8+EGFR  7. Seizure disorder -He continues to take his seizure medicines and has not had any seizure activity and years.  8. Abnormal lung sounds -The previous chest x-ray has some emphysematous changes and we will get a repeat peak x-ray today because the crackles heard on the exam. - DG Chest 2 View; Future  Meds ordered this encounter  Medications  . Multiple Vitamins-Minerals (ICAPS AREDS FORMULA PO)    Sig: Take 1 capsule by mouth 2 (two) times daily.  . pantoprazole (PROTONIX) 40 MG tablet    Sig: TAKE 1 TABLET ONCE DAILY 30 MINUTES BEFORE a MEAL.    Dispense:  90 tablet    Refill:  3   Patient Instructions                       Medicare Annual Wellness Visit  [facility] and the medical providers at [facility] strive to bring you the best medical care.  In doing so we not only want to address your current medical conditions and concerns but also to detect new conditions early and prevent illness, disease and health-related problems.    Medicare offers a yearly Wellness Visit which allows our clinical staff to assess your need for preventative services including immunizations, lifestyle education, counseling to decrease risk of preventable diseases and screening for fall risk and other medical concerns.    This visit is provided free of charge (no copay) for all Medicare recipients. The clinical pharmacists at [facility] have begun to conduct these Wellness Visits which will also include a thorough review of all your medications.    As you primary medical provider recommend that you make an appointment for your Annual  Wellness Visit if you have not done so already this year.  You may set up this appointment before you leave today or you may call back ([phone]) and schedule an appointment.  Please make sure when you call that you mention that you are scheduling your Annual Wellness Visit with the clinical pharmacist so that the appointment may be made for the proper length of time.     Continue current medications. Continue good therapeutic lifestyle changes which include good diet and exercise. Fall precautions discussed with patient. If an FOBT was given today- please return it to our front desk. If you are over 39 years old - you may need Prevnar 90 or the adult Pneumonia vaccine.  Flu Shots are still available at our office. If you still haven't had one please call to set up a nurse visit to get one.   After your visit with [place] today you will receive a survey in the mail or online from [employer] regarding your care with [place]. Please take a moment to fill this out. Your feedback is very important to [place] as you can help [place] better understand your patient  needs as well as improve your experience and satisfaction. WE CARE ABOUT YOU!!!   Keep regular appointments with urology Always be careful and do not put yourself at risk for falling Please consider rescheduling for physical therapy next-door with rehabilitation to get your energy level back Drink plenty of water and do less eating at nighttime Watch sodium intake Avoid carbohydrates in the diet as much as possible    Arrie Senate MD

## 2015-01-22 LAB — PSA, TOTAL AND FREE
PSA FREE: 0.01 ng/mL
PSA, Free Pct: >10 %

## 2015-01-22 LAB — BMP8+EGFR
BUN/Creatinine Ratio: 23 — ABNORMAL HIGH (ref 10–22)
BUN: 22 mg/dL (ref 8–27)
CALCIUM: 10 mg/dL (ref 8.6–10.2)
CHLORIDE: 104 mmol/L (ref 97–108)
CO2: 24 mmol/L (ref 18–29)
Creatinine, Ser: 0.94 mg/dL (ref 0.76–1.27)
GFR calc Af Amer: 88 mL/min/{1.73_m2} (ref >59)
GFR, EST NON AFRICAN AMERICAN: 76 mL/min/{1.73_m2} (ref >59)
GLUCOSE: 102 mg/dL — AB (ref 65–99)
POTASSIUM: 5 mmol/L (ref 3.5–5.2)
Sodium: 144 mmol/L (ref 134–144)

## 2015-01-22 LAB — NMR, LIPOPROFILE
Cholesterol: 163 mg/dL (ref 100–199)
HDL CHOLESTEROL BY NMR: 57 mg/dL (ref >39)
HDL Particle Number: 32.8 umol/L (ref >=30.5)
LDL Particle Number: 1170 nmol/L — ABNORMAL HIGH (ref <1000)
LDL Size: 20.8 nm (ref >20.5)
LDL-C: 92 mg/dL (ref 0–99)
LP-IR Score: 47 — ABNORMAL HIGH (ref <=45)
Small LDL Particle Number: 544 nmol/L — ABNORMAL HIGH (ref <=527)
Triglycerides by NMR: 70 mg/dL (ref 0–149)

## 2015-01-22 LAB — HEPATIC FUNCTION PANEL
ALBUMIN: 4.4 g/dL (ref 3.5–4.7)
ALT: 16 IU/L (ref 0–44)
AST: 23 IU/L (ref 0–40)
Alkaline Phosphatase: 123 IU/L — ABNORMAL HIGH (ref 39–117)
Bilirubin Total: 0.3 mg/dL (ref 0.0–1.2)
Bilirubin, Direct: 0.12 mg/dL (ref 0.00–0.40)
Total Protein: 6.6 g/dL (ref 6.0–8.5)

## 2015-01-22 LAB — VITAMIN D 25 HYDROXY (VIT D DEFICIENCY, FRACTURES): VIT D 25 HYDROXY: 49.3 ng/mL (ref 30.0–100.0)

## 2015-02-12 DIAGNOSIS — R350 Frequency of micturition: Secondary | ICD-10-CM | POA: Diagnosis not present

## 2015-02-12 DIAGNOSIS — Z8546 Personal history of malignant neoplasm of prostate: Secondary | ICD-10-CM | POA: Diagnosis not present

## 2015-02-12 DIAGNOSIS — R339 Retention of urine, unspecified: Secondary | ICD-10-CM | POA: Diagnosis not present

## 2015-02-20 ENCOUNTER — Encounter: Payer: Self-pay | Admitting: Internal Medicine

## 2015-02-24 DIAGNOSIS — L814 Other melanin hyperpigmentation: Secondary | ICD-10-CM | POA: Diagnosis not present

## 2015-02-24 DIAGNOSIS — L820 Inflamed seborrheic keratosis: Secondary | ICD-10-CM | POA: Diagnosis not present

## 2015-02-24 DIAGNOSIS — D225 Melanocytic nevi of trunk: Secondary | ICD-10-CM | POA: Diagnosis not present

## 2015-02-24 DIAGNOSIS — L570 Actinic keratosis: Secondary | ICD-10-CM | POA: Diagnosis not present

## 2015-04-01 ENCOUNTER — Telehealth: Payer: Self-pay | Admitting: Family Medicine

## 2015-04-01 NOTE — Telephone Encounter (Signed)
Appointment given for 2:45 with Laurance Flatten tomorrow.

## 2015-04-02 ENCOUNTER — Encounter: Payer: Self-pay | Admitting: Family Medicine

## 2015-04-02 ENCOUNTER — Ambulatory Visit (INDEPENDENT_AMBULATORY_CARE_PROVIDER_SITE_OTHER): Payer: Medicare Other | Admitting: Family Medicine

## 2015-04-02 ENCOUNTER — Ambulatory Visit (INDEPENDENT_AMBULATORY_CARE_PROVIDER_SITE_OTHER): Payer: Medicare Other

## 2015-04-02 VITALS — BP 129/75 | HR 89 | Temp 97.1°F | Ht 71.25 in | Wt 190.8 lb

## 2015-04-02 DIAGNOSIS — W57XXXA Bitten or stung by nonvenomous insect and other nonvenomous arthropods, initial encounter: Secondary | ICD-10-CM

## 2015-04-02 DIAGNOSIS — R05 Cough: Secondary | ICD-10-CM

## 2015-04-02 DIAGNOSIS — M542 Cervicalgia: Secondary | ICD-10-CM

## 2015-04-02 DIAGNOSIS — R059 Cough, unspecified: Secondary | ICD-10-CM

## 2015-04-02 DIAGNOSIS — R5383 Other fatigue: Secondary | ICD-10-CM

## 2015-04-02 DIAGNOSIS — T148 Other injury of unspecified body region: Secondary | ICD-10-CM

## 2015-04-02 DIAGNOSIS — R5381 Other malaise: Secondary | ICD-10-CM

## 2015-04-02 DIAGNOSIS — J209 Acute bronchitis, unspecified: Secondary | ICD-10-CM | POA: Diagnosis not present

## 2015-04-02 LAB — POCT CBC
Granulocyte percent: 72.9 %G (ref 37–80)
HCT, POC: 41.2 % — AB (ref 43.5–53.7)
Hemoglobin: 13.5 g/dL — AB (ref 14.1–18.1)
LYMPH, POC: 3.4 (ref 0.6–3.4)
MCH: 28.7 pg (ref 27–31.2)
MCHC: 32.9 g/dL (ref 31.8–35.4)
MCV: 87.3 fL (ref 80–97)
MPV: 7.4 fL (ref 0–99.8)
POC Granulocyte: 13.4 — AB (ref 2–6.9)
POC LYMPH PERCENT: 18.3 %L (ref 10–50)
Platelet Count, POC: 367 10*3/uL (ref 142–424)
RBC: 4.72 M/uL (ref 4.69–6.13)
RDW, POC: 13.5 %
WBC: 18.4 10*3/uL — AB (ref 4.6–10.2)

## 2015-04-02 MED ORDER — LEVOFLOXACIN 500 MG PO TABS: 500.0000 mg | ORAL_TABLET | Freq: Every day | ORAL | Status: DC

## 2015-04-02 NOTE — Patient Instructions (Signed)
The patient should drink plenty of fluids He should take 2 regular strength Tylenol every 4-6 hours as needed for aches pains and fever He should take Mucinex maximum strength plain, blue and white in color, 1 twice daily with a large glass of water Take antibiotic as directed Apply warm wet compresses to neck

## 2015-04-02 NOTE — Progress Notes (Signed)
Subjective:    Patient ID: Joshua [name], male    DOB: 06/17/1932, 81 y.o.   MRN: [id number]  HPI Patient is here today complaining with a cough for 2 weeks. He has not had any fever. The patient does give a history of riding on his 4 wheeler in a wooded area on a couple of days in a row a couple weeks ago. He's been coughing since that time. He also removed several ticks. He complains of his neck hurting a lot because he's been coughing so much. He indicates she's not coughing up anything and has not been running any fever.   Review of Systems  Constitutional: Negative.   HENT: Negative.   Eyes: Negative.   Respiratory: Positive for cough.   Cardiovascular: Negative.   Gastrointestinal: Negative.   Endocrine: Negative.   Genitourinary: Negative.   Musculoskeletal: Negative.   Skin: Negative.   Allergic/Immunologic: Negative.   Neurological: Negative.   Hematological: Negative.   Psychiatric/Behavioral: Negative.         Patient Active Problem List   Diagnosis Date Noted  . Prostate cancer   . Hypercholesterolemia   . Hypertension   . Seizures   . GERD 08/22/2008  . COLONIC POLYPS, ADENOMATOUS, HX OF 08/22/2008   Outpatient Encounter Prescriptions as of 04/02/2015  Medication Sig  . calcium carbonate (OS-CAL) 600 MG TABS tablet Take 600 mg by mouth daily with breakfast.  . clopidogrel (PLAVIX) 75 MG tablet TAKE 1 TABLET BY MOUTH DAILY  . Multiple Vitamins-Minerals (EYE VITAMINS PO) Take by mouth.  . Multiple Vitamins-Minerals (ICAPS AREDS FORMULA PO) Take 1 capsule by mouth 2 (two) times daily.  . pantoprazole (PROTONIX) 40 MG tablet TAKE 1 TABLET ONCE DAILY 30 MINUTES BEFORE a MEAL.  Marland Kitchen PHENobarbital (LUMINAL) 32.4 MG tablet Take 2 tabs in am, 3 tabs pm per pt statement  . phenytoin (DILANTIN) 100 MG ER capsule 2 caps in the morning and 3 caps QHS  . pravastatin (PRAVACHOL) 80 MG tablet Take 1 tablet (80 mg total) by mouth daily.  Marland Kitchen VITAMIN D, ERGOCALCIFEROL, PO  Take 600 Units by mouth daily.    No facility-administered encounter medications on file as of 04/02/2015.      Objective:   Physical Exam  Constitutional: He is oriented to person, place, and time. He appears well-developed and well-nourished. He appears distressed.  HENT:  Head: Normocephalic.  Right Ear: External ear normal.  Left Ear: External ear normal.  Nose: Nose normal.  Mouth/Throat: Oropharynx is clear and moist. No oropharyngeal exudate.  Eyes: Conjunctivae and EOM are normal. Pupils are equal, round, and reactive to light. Right eye exhibits no discharge. Left eye exhibits no discharge. No scleral icterus.  Neck: Normal range of motion. No thyromegaly present.  The neck is tender and somewhat swollen posteriorly  Cardiovascular: Normal rate, regular rhythm and normal heart sounds.   No murmur heard. Pulmonary/Chest: Effort normal. No respiratory distress. He has no wheezes. He has rales. He exhibits no tenderness.  Crackles bilaterally right greater than left posteriorly in lung bases  Musculoskeletal: He exhibits no edema.  The neck is somewhat stiff and swollen and tender to palpation  Lymphadenopathy:    He has no cervical adenopathy.  Neurological: He is alert and oriented to person, place, and time.  Skin: Skin is warm and dry. No rash noted.  Psychiatric: He has a normal mood and affect. His behavior is normal. Judgment and thought content normal.  Nursing note and vitals reviewed.  BP 129/75 mmHg  Pulse 89  Temp(Src) 97.1 F (36.2 C) (Oral)  Ht 5' 11.25" (1.81 m)  Wt 190 lb 12.8 oz (86.546 kg)  BMI 26.42 kg/m2  [facility] reading (PRIMARY) by  Dr. Brunilda Payor x-ray-cardiomegaly , increased bronchial markings                                Results for orders placed or performed in visit on 04/02/15  POCT CBC  Result Value Ref Range   WBC 18.4 (A) 4.6 - 10.2 K/uL   Lymph, poc 3.4 0.6 - 3.4   POC LYMPH PERCENT 18.3 10 - 50 %L   POC Granulocyte 13.4 (A) 2 -  6.9   Granulocyte percent 72.9 37 - 80 %G   RBC 4.72 4.69 - 6.13 M/uL   Hemoglobin 13.5 (A) 14.1 - 18.1 g/dL   HCT, POC 41.2 (A) 43.5 - 53.7 %   MCV 87.3 80 - 97 fL   MCH, POC 28.7 27 - 31.2 pg   MCHC 32.9 31.8 - 35.4 g/dL   RDW, POC 13.5 %   Platelet Count, POC 367.0 142 - 424 K/uL   MPV 7.4 0 - 99.8 fL   Previous BMP was reviewed and his creatinine was normal at that time. A previous chest x-ray done in May showed some basilar atelectasis and an enlarged heart area and the bronchial markings on today's chest x-ray may be somewhat increased compared to then.       Assessment & Plan:  1. Cough -Take Mucinex for cough and congestion and take this regularly with a large glass of water - DG Chest 2 View; Future - POCT CBC - BMP8+EGFR  2. Acute bronchitis, unspecified organism -Take antibiotic as directed - levofloxacin (LEVAQUIN) 500 MG tablet; Take 1 tablet (500 mg total) by mouth daily.  Dispense: 7 tablet; Refill: 0 - BMP8+EGFR  3. Malaise and fatigue -Take Tylenol regularly every 6 hours - BMP8+EGFR  4. Neck pain -Take Tylenol for neck pain and use warm wet compresses - BMP8+EGFR  5. Tick bite -Lab work for tick titers  Meds ordered this encounter  Medications  . Multiple Vitamins-Minerals (EYE VITAMINS PO)    Sig: Take by mouth.  . levofloxacin (LEVAQUIN) 500 MG tablet    Sig: Take 1 tablet (500 mg total) by mouth daily.    Dispense:  7 tablet    Refill:  0   Patient Instructions  The patient should drink plenty of fluids He should take 2 regular strength Tylenol every 4-6 hours as needed for aches pains and fever He should take Mucinex maximum strength plain, blue and white in color, 1 twice daily with a large glass of water Take antibiotic as directed Apply warm wet compresses to neck    the patient will follow-up Monday with a provider in the office provided that he does not get worse. Arrie Senate MD

## 2015-04-06 ENCOUNTER — Ambulatory Visit (INDEPENDENT_AMBULATORY_CARE_PROVIDER_SITE_OTHER): Payer: Medicare Other | Admitting: Family Medicine

## 2015-04-06 ENCOUNTER — Encounter: Payer: Self-pay | Admitting: Family Medicine

## 2015-04-06 VITALS — BP 162/75 | HR 62 | Temp 97.8°F | Ht 71.25 in | Wt 193.0 lb

## 2015-04-06 DIAGNOSIS — R05 Cough: Secondary | ICD-10-CM

## 2015-04-06 DIAGNOSIS — R059 Cough, unspecified: Secondary | ICD-10-CM

## 2015-04-06 LAB — LYME AB/WESTERN BLOT REFLEX
LYME DISEASE AB, QUANT, IGM: <0.8 index (ref 0.00–0.79)
Lyme IgG/IgM Ab: <0.91 {ISR} (ref 0.00–0.90)

## 2015-04-06 LAB — BMP8+EGFR
BUN / CREAT RATIO: 20 (ref 10–22)
BUN: 19 mg/dL (ref 8–27)
CO2: 20 mmol/L (ref 18–29)
CREATININE: 0.94 mg/dL (ref 0.76–1.27)
Calcium: 9.8 mg/dL (ref 8.6–10.2)
Chloride: 101 mmol/L (ref 97–108)
GFR calc non Af Amer: 76 mL/min/{1.73_m2} (ref >59)
GFR, EST AFRICAN AMERICAN: 88 mL/min/{1.73_m2} (ref >59)
Glucose: 103 mg/dL — ABNORMAL HIGH (ref 65–99)
POTASSIUM: 4.7 mmol/L (ref 3.5–5.2)
SODIUM: 143 mmol/L (ref 134–144)

## 2015-04-06 LAB — POCT CBC
Granulocyte percent: 67.6 %G (ref 37–80)
HCT, POC: 38.4 % — AB (ref 43.5–53.7)
Hemoglobin: 12.4 g/dL — AB (ref 14.1–18.1)
Lymph, poc: 2.5 (ref 0.6–3.4)
MCH: 28.6 pg (ref 27–31.2)
MCHC: 32.3 g/dL (ref 31.8–35.4)
MCV: 88.5 fL (ref 80–97)
MPV: 6.2 fL (ref 0–99.8)
POC GRANULOCYTE: 6.8 (ref 2–6.9)
POC LYMPH PERCENT: 25 %L (ref 10–50)
Platelet Count, POC: 413 10*3/uL (ref 142–424)
RBC: 4.34 M/uL — AB (ref 4.69–6.13)
RDW, POC: 13.5 %
WBC: 10 10*3/uL (ref 4.6–10.2)

## 2015-04-06 LAB — ROCKY MTN SPOTTED FVR ABS PNL(IGG+IGM)
RMSF IGM: 0.29 {index} (ref 0.00–0.89)
RMSF IgG: POSITIVE — AB

## 2015-04-06 LAB — RMSF, IGG, IFA

## 2015-04-06 NOTE — Progress Notes (Signed)
   Subjective:    Patient ID: Joshua [name], male    DOB: 10/07/1931, 81 y.o.   MRN: [id number]  HPI Patient here today for follow up on bronchitis. He is feeling better, but still has a cough. Cough is nonproductive he has had some headache. Review of his labs from last week shows positive titer for [facility] spotted fever, but level is IGG rather than IgM, thus it appears to be old infection.      Patient Active Problem List   Diagnosis Date Noted  . Prostate cancer   . Hypercholesterolemia   . Hypertension   . Seizures   . GERD 08/22/2008  . COLONIC POLYPS, ADENOMATOUS, HX OF 08/22/2008   Outpatient Encounter Prescriptions as of 04/06/2015  Medication Sig  . calcium carbonate (OS-CAL) 600 MG TABS tablet Take 600 mg by mouth daily with breakfast.  . clopidogrel (PLAVIX) 75 MG tablet TAKE 1 TABLET BY MOUTH DAILY  . levofloxacin (LEVAQUIN) 500 MG tablet Take 1 tablet (500 mg total) by mouth daily.  . Multiple Vitamins-Minerals (EYE VITAMINS PO) Take by mouth.  . Multiple Vitamins-Minerals (ICAPS AREDS FORMULA PO) Take 1 capsule by mouth 2 (two) times daily.  . pantoprazole (PROTONIX) 40 MG tablet TAKE 1 TABLET ONCE DAILY 30 MINUTES BEFORE a MEAL.  Marland Kitchen PHENobarbital (LUMINAL) 32.4 MG tablet Take 2 tabs in am, 3 tabs pm per pt statement  . phenytoin (DILANTIN) 100 MG ER capsule 2 caps in the morning and 3 caps QHS  . pravastatin (PRAVACHOL) 80 MG tablet Take 1 tablet (80 mg total) by mouth daily.  Marland Kitchen VITAMIN D, ERGOCALCIFEROL, PO Take 600 Units by mouth daily.    No facility-administered encounter medications on file as of 04/06/2015.     Review of Systems  Constitutional: Negative.  Negative for fever.  Eyes: Negative.   Respiratory: Positive for cough (non productive).   Cardiovascular: Negative.   Gastrointestinal: Negative.   Endocrine: Negative.   Genitourinary: Negative.   Musculoskeletal: Negative.   Skin: Negative.   Allergic/Immunologic: Negative.     Neurological: Negative.   Hematological: Negative.   Psychiatric/Behavioral: Negative.        Objective:   Physical Exam  Constitutional: He appears well-developed and well-nourished.  Neck: Normal range of motion. Neck supple.  Pulmonary/Chest: Effort normal. He has rales.  There are a few rales present at the bases   BP 162/75 mmHg  Pulse 62  Temp(Src) 97.8 F (36.6 C) (Oral)  Ht 5' 11.25" (1.81 m)  Wt 193 lb (87.544 kg)  BMI 26.72 kg/m2        Assessment & Plan:  1. Cough White count was checked again today and it has normalized from 18,000 down to now 10,000. Since he does feel better on Levaquin I'm inclined to have him continue through the course of Levaquin and call or return if symptoms do not resolve  Wardell Honour MD - POCT CBC

## 2015-04-07 ENCOUNTER — Other Ambulatory Visit: Payer: Self-pay | Admitting: *Deleted

## 2015-04-07 MED ORDER — DOXYCYCLINE HYCLATE 100 MG PO TABS: 100.0000 mg | ORAL_TABLET | Freq: Two times a day (BID) | ORAL | Status: DC

## 2015-06-03 ENCOUNTER — Telehealth: Payer: Self-pay | Admitting: Family Medicine

## 2015-06-03 NOTE — Telephone Encounter (Signed)
Left message for patient regarding prescription- he should have an additional refill left on last prescription.

## 2015-06-05 MED ORDER — PHENOBARBITAL 32.4 MG PO TABS: ORAL_TABLET | ORAL | Status: DC

## 2015-06-05 NOTE — Telephone Encounter (Signed)
Pt aware rx is printed and he will pick up

## 2015-06-09 ENCOUNTER — Encounter: Payer: Self-pay | Admitting: Family Medicine

## 2015-06-09 ENCOUNTER — Ambulatory Visit (INDEPENDENT_AMBULATORY_CARE_PROVIDER_SITE_OTHER): Payer: Medicare Other | Admitting: Family Medicine

## 2015-06-09 VITALS — BP 128/78 | HR 60 | Temp 97.0°F | Ht 71.25 in | Wt 191.0 lb

## 2015-06-09 DIAGNOSIS — C61 Malignant neoplasm of prostate: Secondary | ICD-10-CM

## 2015-06-09 DIAGNOSIS — E7800 Pure hypercholesterolemia, unspecified: Secondary | ICD-10-CM

## 2015-06-09 DIAGNOSIS — E559 Vitamin D deficiency, unspecified: Secondary | ICD-10-CM

## 2015-06-09 DIAGNOSIS — E8881 Metabolic syndrome: Secondary | ICD-10-CM | POA: Diagnosis not present

## 2015-06-09 DIAGNOSIS — K219 Gastro-esophageal reflux disease without esophagitis: Secondary | ICD-10-CM | POA: Diagnosis not present

## 2015-06-09 DIAGNOSIS — G40909 Epilepsy, unspecified, not intractable, without status epilepticus: Secondary | ICD-10-CM | POA: Diagnosis not present

## 2015-06-09 DIAGNOSIS — I10 Essential (primary) hypertension: Secondary | ICD-10-CM

## 2015-06-09 NOTE — Progress Notes (Signed)
Subjective:    Patient ID: Joshua [name], male    DOB: [birth year], 81 y.o.   MRN: [id number]  HPI Pt here for follow up and management of chronic medical problems which includes seizures, hypertension, and hyperlipidemia. He is taking medications regularly. This patient has prostate cancer and is followed regularly by the urologist. He also has a long problem with seizure disorder and he has been seizure-free for quite a while. He continues to take medication for this. The patient has had no seizure activity. He denies chest pain shortness of breath trouble swallowing heartburn indigestion and nausea vomiting or diarrhea. He has not had any blood in the stool and he is passing his water well without problems. He follows up yearly with his urologist because of the prostate cancer and everything is stable with this. He is been encouraged by his family to get his hearing checked but he refuses to do this and says he really doesn't have that many problems with his hearing.     Patient Active Problem List   Diagnosis Date Noted  . Prostate cancer   . Hypercholesterolemia   . Hypertension   . Seizures   . GERD 08/22/2008  . COLONIC POLYPS, ADENOMATOUS, HX OF 08/22/2008   Outpatient Encounter Prescriptions as of 06/09/2015  Medication Sig  . calcium carbonate (OS-CAL) 600 MG TABS tablet Take 600 mg by mouth daily with breakfast.  . clopidogrel (PLAVIX) 75 MG tablet TAKE 1 TABLET BY MOUTH DAILY  . Multiple Vitamins-Minerals (EYE VITAMINS PO) Take by mouth.  . Multiple Vitamins-Minerals (ICAPS AREDS FORMULA PO) Take 1 capsule by mouth 2 (two) times daily.  . pantoprazole (PROTONIX) 40 MG tablet TAKE 1 TABLET ONCE DAILY 30 MINUTES BEFORE a MEAL.  Marland Kitchen PHENobarbital (LUMINAL) 32.4 MG tablet Take 2 tabs in am, 3 tabs pm per pt statement  . phenytoin (DILANTIN) 100 MG ER capsule 2 caps in the morning and 3 caps QHS  . pravastatin (PRAVACHOL) 80 MG tablet Take 1 tablet (80 mg total) by mouth daily.    Marland Kitchen VITAMIN D, ERGOCALCIFEROL, PO Take 600 Units by mouth daily.   . [DISCONTINUED] doxycycline (VIBRA-TABS) 100 MG tablet Take 1 tablet (100 mg total) by mouth 2 (two) times daily. Take with food  . [DISCONTINUED] levofloxacin (LEVAQUIN) 500 MG tablet Take 1 tablet (500 mg total) by mouth daily.   No facility-administered encounter medications on file as of 06/09/2015.      Review of Systems  Constitutional: Negative.   HENT: Negative.   Eyes: Negative.   Respiratory: Negative.   Cardiovascular: Negative.   Gastrointestinal: Negative.   Endocrine: Negative.   Genitourinary: Negative.   Musculoskeletal: Negative.   Skin: Negative.   Allergic/Immunologic: Negative.   Neurological: Negative.   Hematological: Negative.   Psychiatric/Behavioral: Negative.        Objective:   Physical Exam  Constitutional: He is oriented to person, place, and time. He appears well-developed and well-nourished. No distress.  The patient is pleasant and alert and in good spirits for his age.  HENT:  Head: Normocephalic and atraumatic.  Right Ear: External ear normal.  Left Ear: External ear normal.  Nose: Nose normal.  Mouth/Throat: Oropharynx is clear and moist. No oropharyngeal exudate.  Eyes: Conjunctivae and EOM are normal. Pupils are equal, round, and reactive to light. Right eye exhibits no discharge. Left eye exhibits no discharge. No scleral icterus.  Neck: Normal range of motion. Neck supple. No thyromegaly present.  Cardiovascular: Normal rate, regular rhythm,  normal heart sounds and intact distal pulses.  Exam reveals no gallop and no friction rub.   No murmur heard. At 72/m  Pulmonary/Chest: Effort normal and breath sounds normal. No respiratory distress. He has no wheezes. He has no rales. He exhibits no tenderness.  Abdominal: Soft. Bowel sounds are normal. He exhibits no mass. There is no tenderness. There is no rebound and no guarding.  Genitourinary:  This exam is done by the  urologist regularly and yearly  Musculoskeletal: Normal range of motion. He exhibits no edema or tenderness.  Lymphadenopathy:    He has no cervical adenopathy.  Neurological: He is alert and oriented to person, place, and time. He has normal reflexes. No cranial nerve deficit.  Skin: Skin is warm and dry. No rash noted.  Psychiatric: He has a normal mood and affect. His behavior is normal. Judgment and thought content normal.  Nursing note and vitals reviewed.  BP 156/65 mmHg  Pulse 60  Temp(Src) 97 F (36.1 C) (Oral)  Ht 5' 11.25" (1.81 m)  Wt 191 lb (86.637 kg)  BMI 26.45 kg/m2        Assessment & Plan:  1. Hypercholesterolemia -Continue pravastatin pending results of lab work - CBC with Differential/Platelet - NMR, lipoprofile  2. Essential hypertension -Repeat PT blood pressure today was good and within normal limits. The patient needs to continue to watch his sodium intake. - BMP8+EGFR - CBC with Differential/Platelet - Hepatic function panel  3. Prostate cancer -The patient follows up regularly with the urologist - CBC with Differential/Platelet  4. Vitamin D deficiency -Continue vitamin D replacement pending results of lab work - CBC with Differential/Platelet - Vit D  25 hydroxy (rtn osteoporosis monitoring)  5. Gastroesophageal reflux disease, esophagitis presence not specified -He has no complaints with this today and he will continue to take his pantoprazole. - CBC with Differential/Platelet - Hepatic function panel  6. Metabolic syndrome -Continue to work on diet and exercise as much as possible - BMP8+EGFR - CBC with Differential/Platelet  7. Seizure disorder -He has had no additional seizures and he will continue with his current treatment and we will will be getting blood levels of his seizure medicine during the visit today. - CBC with Differential/Platelet - Phenobarbital level  No orders of the defined types were placed in this encounter.     Patient Instructions                       Medicare Annual Wellness Visit  [facility] and the medical providers at [facility] strive to bring you the best medical care.  In doing so we not only want to address your current medical conditions and concerns but also to detect new conditions early and prevent illness, disease and health-related problems.    Medicare offers a yearly Wellness Visit which allows our clinical staff to assess your need for preventative services including immunizations, lifestyle education, counseling to decrease risk of preventable diseases and screening for fall risk and other medical concerns.    This visit is provided free of charge (no copay) for all Medicare recipients. The clinical pharmacists at [facility] have begun to conduct these Wellness Visits which will also include a thorough review of all your medications.    As you primary medical provider recommend that you make an appointment for your Annual Wellness Visit if you have not done so already this year.  You may set up this appointment before  you leave today or you may call back ([phone]) and schedule an appointment.  Please make sure when you call that you mention that you are scheduling your Annual Wellness Visit with the clinical pharmacist so that the appointment may be made for the proper length of time.     Continue current medications. Continue good therapeutic lifestyle changes which include good diet and exercise. Fall precautions discussed with patient. If an FOBT was given today- please return it to our front desk. If you are over 64 years old - you may need Prevnar 18 or the adult Pneumonia vaccine.  **Flu shots will be available soon--- please call and schedule a FLU-CLINIC appointment**  After your visit with [place] today you will receive a survey in the mail or online from [employer] regarding your care with [place]. Please take a moment to fill this  out. Your feedback is very important to [place] as you can help [place] better understand your patient needs as well as improve your experience and satisfaction. WE CARE ABOUT YOU!!!   **Please join [place] SEPT.[email or web address], 2016 from 5:00 to 7:00pm for our OPEN HOUSE! Come out and meet our NEW providers** Stay active Drink plenty of fluids- Use deep [place] off if out in the woods to protect against ticks Don't forget to get your flu shot   Arrie Senate MD

## 2015-06-09 NOTE — Patient Instructions (Addendum)
Medicare Annual Wellness Visit  [facility] and the medical providers at [facility] strive to bring you the best medical care.  In doing so we not only want to address your current medical conditions and concerns but also to detect new conditions early and prevent illness, disease and health-related problems.    Medicare offers a yearly Wellness Visit which allows our clinical staff to assess your need for preventative services including immunizations, lifestyle education, counseling to decrease risk of preventable diseases and screening for fall risk and other medical concerns.    This visit is provided free of charge (no copay) for all Medicare recipients. The clinical pharmacists at [facility] have begun to conduct these Wellness Visits which will also include a thorough review of all your medications.    As you primary medical Taron Conrey recommend that you make an appointment for your Annual Wellness Visit if you have not done so already this year.  You may set up this appointment before you leave today or you may call back ([phone]) and schedule an appointment.  Please make sure when you call that you mention that you are scheduling your Annual Wellness Visit with the clinical pharmacist so that the appointment may be made for the proper length of time.     Continue current medications. Continue good therapeutic lifestyle changes which include good diet and exercise. Fall precautions discussed with patient. If an FOBT was given today- please return it to our front desk. If you are over 67 years old - you may need Prevnar 75 or the adult Pneumonia vaccine.  **Flu shots will be available soon--- please call and schedule a FLU-CLINIC appointment**  After your visit with [place] today you will receive a survey in the mail or online from [employer] regarding your care with [place]. Please take a moment to fill this out. Your feedback is  very important to [place] as you can help [place] better understand your patient needs as well as improve your experience and satisfaction. WE CARE ABOUT YOU!!!   **Please join [place] [email or web address], 2016 from 5:00 to 7:00pm for our OPEN HOUSE! Come out and meet our NEW providers** Stay active Drink plenty of fluids- Use deep [place] off if out in the woods to protect against ticks Don't forget to get your flu shot

## 2015-06-10 LAB — NMR, LIPOPROFILE
CHOLESTEROL: 165 mg/dL (ref 100–199)
HDL Cholesterol by NMR: 61 mg/dL (ref >39)
HDL PARTICLE NUMBER: 31.8 umol/L (ref >=30.5)
LDL PARTICLE NUMBER: 1248 nmol/L — AB (ref <1000)
LDL SIZE: 20.7 nm (ref >20.5)
LDL-C: 90 mg/dL (ref 0–99)
Small LDL Particle Number: 582 nmol/L — ABNORMAL HIGH (ref <=527)
Triglycerides by NMR: 68 mg/dL (ref 0–149)

## 2015-06-10 LAB — HEPATIC FUNCTION PANEL
ALBUMIN: 4.2 g/dL (ref 3.5–4.7)
ALK PHOS: 126 IU/L — AB (ref 39–117)
ALT: 19 IU/L (ref 0–44)
AST: 23 IU/L (ref 0–40)
BILIRUBIN, DIRECT: 0.1 mg/dL (ref 0.00–0.40)
Bilirubin Total: 0.3 mg/dL (ref 0.0–1.2)
TOTAL PROTEIN: 6.5 g/dL (ref 6.0–8.5)

## 2015-06-10 LAB — BMP8+EGFR
BUN / CREAT RATIO: 19 (ref 10–22)
BUN: 16 mg/dL (ref 8–27)
CO2: 22 mmol/L (ref 18–29)
CREATININE: 0.83 mg/dL (ref 0.76–1.27)
Calcium: 9.5 mg/dL (ref 8.6–10.2)
Chloride: 104 mmol/L (ref 97–108)
GFR calc Af Amer: 95 mL/min/{1.73_m2} (ref >59)
GFR calc non Af Amer: 83 mL/min/{1.73_m2} (ref >59)
Glucose: 95 mg/dL (ref 65–99)
Potassium: 4.4 mmol/L (ref 3.5–5.2)
Sodium: 142 mmol/L (ref 134–144)

## 2015-06-10 LAB — CBC WITH DIFFERENTIAL/PLATELET
BASOS ABS: 0.1 10*3/uL (ref 0.0–0.2)
Basos: 1 %
EOS (ABSOLUTE): 0.8 10*3/uL — ABNORMAL HIGH (ref 0.0–0.4)
EOS: 9 %
HEMATOCRIT: 40 % (ref 37.5–51.0)
HEMOGLOBIN: 13.7 g/dL (ref 12.6–17.7)
IMMATURE GRANS (ABS): 0 10*3/uL (ref 0.0–0.1)
Immature Granulocytes: 0 %
LYMPHS: 28 %
Lymphocytes Absolute: 2.4 10*3/uL (ref 0.7–3.1)
MCH: 30.2 pg (ref 26.6–33.0)
MCHC: 34.3 g/dL (ref 31.5–35.7)
MCV: 88 fL (ref 79–97)
MONOCYTES: 8 %
Monocytes Absolute: 0.7 10*3/uL (ref 0.1–0.9)
NEUTROS ABS: 4.6 10*3/uL (ref 1.4–7.0)
Neutrophils: 54 %
Platelets: 264 10*3/uL (ref 150–379)
RBC: 4.53 x10E6/uL (ref 4.14–5.80)
RDW: 14.3 % (ref 12.3–15.4)
WBC: 8.6 10*3/uL (ref 3.4–10.8)

## 2015-06-10 LAB — VITAMIN D 25 HYDROXY (VIT D DEFICIENCY, FRACTURES): Vit D, 25-Hydroxy: 46.3 ng/mL (ref 30.0–100.0)

## 2015-06-10 LAB — PHENOBARBITAL LEVEL: Phenobarbital, Serum: 28 ug/mL (ref 15–40)

## 2015-06-15 ENCOUNTER — Telehealth: Payer: Self-pay | Admitting: Family Medicine

## 2015-06-15 NOTE — Telephone Encounter (Signed)
Patient aware of results.

## 2015-07-14 ENCOUNTER — Other Ambulatory Visit: Payer: Self-pay | Admitting: Family Medicine

## 2015-07-14 MED ORDER — CLOPIDOGREL BISULFATE 75 MG PO TABS: 75.0000 mg | ORAL_TABLET | Freq: Every day | ORAL | Status: DC

## 2015-07-14 NOTE — Telephone Encounter (Signed)
Patient aware that rx has been sent to pharmacy.  °

## 2015-08-06 ENCOUNTER — Telehealth: Payer: Self-pay | Admitting: Family Medicine

## 2015-08-25 DIAGNOSIS — L821 Other seborrheic keratosis: Secondary | ICD-10-CM | POA: Diagnosis not present

## 2015-08-25 DIAGNOSIS — L570 Actinic keratosis: Secondary | ICD-10-CM | POA: Diagnosis not present

## 2015-08-26 ENCOUNTER — Ambulatory Visit (INDEPENDENT_AMBULATORY_CARE_PROVIDER_SITE_OTHER): Payer: Medicare Other | Admitting: *Deleted

## 2015-08-26 DIAGNOSIS — Z23 Encounter for immunization: Secondary | ICD-10-CM

## 2015-10-15 ENCOUNTER — Encounter: Payer: Self-pay | Admitting: Family Medicine

## 2015-10-15 ENCOUNTER — Ambulatory Visit (INDEPENDENT_AMBULATORY_CARE_PROVIDER_SITE_OTHER): Payer: Medicare Other | Admitting: Family Medicine

## 2015-10-15 ENCOUNTER — Encounter: Payer: Self-pay | Admitting: *Deleted

## 2015-10-15 VITALS — BP 135/63 | HR 59 | Temp 97.3°F | Ht 71.25 in | Wt 191.0 lb

## 2015-10-15 DIAGNOSIS — L989 Disorder of the skin and subcutaneous tissue, unspecified: Secondary | ICD-10-CM | POA: Diagnosis not present

## 2015-10-15 DIAGNOSIS — E7800 Pure hypercholesterolemia, unspecified: Secondary | ICD-10-CM

## 2015-10-15 DIAGNOSIS — K219 Gastro-esophageal reflux disease without esophagitis: Secondary | ICD-10-CM | POA: Diagnosis not present

## 2015-10-15 DIAGNOSIS — C61 Malignant neoplasm of prostate: Secondary | ICD-10-CM | POA: Diagnosis not present

## 2015-10-15 DIAGNOSIS — G40909 Epilepsy, unspecified, not intractable, without status epilepticus: Secondary | ICD-10-CM | POA: Diagnosis not present

## 2015-10-15 DIAGNOSIS — E559 Vitamin D deficiency, unspecified: Secondary | ICD-10-CM | POA: Diagnosis not present

## 2015-10-15 DIAGNOSIS — I10 Essential (primary) hypertension: Secondary | ICD-10-CM

## 2015-10-15 NOTE — Progress Notes (Unsigned)
Joshua [name], Dr Laurance Flatten wonder what you thought are on taking the seizure meds (phenobarb and dilantin) with Zantac - he is thinking about stopping the protonix and changing to zantac

## 2015-10-15 NOTE — Patient Instructions (Addendum)
Medicare Annual Wellness Visit  [facility] and the medical providers at [facility] strive to bring you the best medical care.  In doing so we not only want to address your current medical conditions and concerns but also to detect new conditions early and prevent illness, disease and health-related problems.    Medicare offers a yearly Wellness Visit which allows our clinical staff to assess your need for preventative services including immunizations, lifestyle education, counseling to decrease risk of preventable diseases and screening for fall risk and other medical concerns.    This visit is provided free of charge (no copay) for all Medicare recipients. The clinical pharmacists at [facility] have begun to conduct these Wellness Visits which will also include a thorough review of all your medications.    As you primary medical provider recommend that you make an appointment for your Annual Wellness Visit if you have not done so already this year.  You may set up this appointment before you leave today or you may call back [phone] and schedule an appointment.  Please make sure when you call that you mention that you are scheduling your Annual Wellness Visit with the clinical pharmacist so that the appointment may be made for the proper length of time.      Continue current medications. Continue good therapeutic lifestyle changes which include good diet and exercise. Fall precautions discussed with patient. If an FOBT was given today- please return it to our front desk. If you are over 42 years old - you may need Prevnar 32 or the adult Pneumonia vaccine.  **Flu shots are available--- please call and schedule a FLU-CLINIC appointment**  After your visit with [place] today you will receive a survey in the mail or online from [employer] regarding your care with [place]. Please take a moment to fill this out. Your feedback is very  important to [place] as you can help [place] better understand your patient needs as well as improve your experience and satisfaction. WE CARE ABOUT YOU!!!   Be sure and call and make an appointment for follow-up with urology Schedule an appointment with one of the providers to remove the lesion at his right mandible area that is in question and remind them that he is taking Plavix. Continue to use the Mucinex and nasal saline and nasal saline gel Keep the house as cool as possible and use a cool mist humidifier Take Zantac 150 or the generic ranitidine 1 twice daily before breakfast and supper if approved by the clinical pharmacists and discontinue the Protonix.

## 2015-10-15 NOTE — Progress Notes (Signed)
Subjective:    Patient ID: Joshua [name], male    DOB: 04-Jun-1932, 82 y.o.   MRN: [id number]  HPI Pt here for follow up and management of chronic medical problems which includes hypertension and seizures. He is taking medications regularly. The patient has no specific complaints today he is doing well. He is due to return an FOBT. He is also due to get lab work and medication levels. His vital signs are stable. He's had prostate cancer and has had seed implants and he plans to continue to follow-up with the urologist. The patient denies any chest pain. He does have occasional shortness of breath and attributes this to may be some COPD. He denies trouble swallowing heartburn indigestion nausea vomiting diarrhea or blood in the stool. He is passing his water without problems. He did have a chest x-ray back in the summer and everything was good on this according to the radiologist.      Patient Active Problem List   Diagnosis Date Noted  . Prostate cancer ([place])   . Hypercholesterolemia   . Hypertension   . Seizures ([place])   . GERD 08/22/2008  . COLONIC POLYPS, ADENOMATOUS, HX OF 08/22/2008   Outpatient Encounter Prescriptions as of 10/15/2015  Medication Sig  . calcium carbonate (OS-CAL) 600 MG TABS tablet Take 600 mg by mouth daily with breakfast.  . clopidogrel (PLAVIX) 75 MG tablet Take 1 tablet (75 mg total) by mouth daily.  . Multiple Vitamins-Minerals (ICAPS AREDS FORMULA PO) Take 1 capsule by mouth 2 (two) times daily.  . pantoprazole (PROTONIX) 40 MG tablet TAKE 1 TABLET ONCE DAILY 30 MINUTES BEFORE a MEAL.  Marland Kitchen PHENobarbital (LUMINAL) 32.4 MG tablet Take 2 tabs in am, 3 tabs pm per pt statement  . phenytoin (DILANTIN) 100 MG ER capsule 2 caps in the morning and 3 caps QHS  . pravastatin (PRAVACHOL) 80 MG tablet Take 1 tablet (80 mg total) by mouth daily.  Marland Kitchen VITAMIN D, ERGOCALCIFEROL, PO Take 600 Units by mouth daily.   . [DISCONTINUED] Multiple Vitamins-Minerals (EYE VITAMINS  PO) Take by mouth.   No facility-administered encounter medications on file as of 10/15/2015.      Review of Systems  Constitutional: Negative.   HENT: Negative.   Eyes: Negative.   Respiratory: Negative.   Cardiovascular: Negative.   Gastrointestinal: Negative.   Endocrine: Negative.   Genitourinary: Negative.   Musculoskeletal: Negative.   Skin: Negative.   Allergic/Immunologic: Negative.   Neurological: Negative.   Hematological: Negative.   Psychiatric/Behavioral: Negative.        Objective:   Physical Exam  Constitutional: He is oriented to person, place, and time. He appears well-developed and well-nourished.  The patient is alert and pleasant.  HENT:  Head: Normocephalic and atraumatic.  Right Ear: External ear normal.  Left Ear: External ear normal.  Mouth/Throat: Oropharynx is clear and moist. No oropharyngeal exudate.  Some nasal congestion bilaterally  Eyes: Conjunctivae and EOM are normal. Pupils are equal, round, and reactive to light. Right eye exhibits no discharge. Left eye exhibits no discharge. No scleral icterus.  Neck: Normal range of motion. Neck supple. No thyromegaly present.  Cardiovascular: Normal rate, regular rhythm and normal heart sounds.   No murmur heard. The heart has a regular rate and rhythm at 72/m  Pulmonary/Chest: Effort normal and breath sounds normal. No respiratory distress. He has no wheezes. He has no rales. He exhibits no tenderness.  Clear anteriorly and posteriorly  Abdominal: Soft. Bowel sounds are normal.  He exhibits no mass. There is no tenderness. There is no rebound and no guarding.  The abdomen was nontender without liver or spleen enlargement and no epigastric tenderness. There were good inguinal pulses without inguinal adenopathy bilaterally.  Genitourinary:  The patient knows to call the urologist and schedule a follow-up appointment with him  Musculoskeletal: Normal range of motion. He exhibits no edema.    Lymphadenopathy:    He has no cervical adenopathy.  Neurological: He is alert and oriented to person, place, and time. He has normal reflexes. No cranial nerve deficit.  Skin: Skin is warm and dry. No rash noted.  There is a skin lesion just behind the right mandibular area which is somewhat suspicious in nature and we will have the patient return to the office for excision of this.  Psychiatric: He has a normal mood and affect. His behavior is normal. Judgment and thought content normal.  Nursing note and vitals reviewed.  BP 135/63 mmHg  Pulse 59  Temp(Src) 97.3 F (36.3 C) (Oral)  Ht 5' 11.25" (1.81 m)  Wt 191 lb (86.637 kg)  BMI 26.45 kg/m2  Recent chest x-ray done in the summer was reviewed and was within normal limits.      Assessment & Plan:  1. Hypercholesterolemia -Continue current treatment pending results of lab work - CBC with Differential/Platelet - Hepatic function panel - NMR, lipoprofile  2. Essential hypertension -Continue watching sodium intake and blood pressure today was good. - BMP8+EGFR - CBC with Differential/Platelet - Hepatic function panel  3. Prostate cancer [facility]) -Follow up with Dr. Jeffie Pollock the urologist. Patient to call and make an appointment. - CBC with Differential/Platelet  4. Vitamin D deficiency -Continue vitamin D replacement pending results of lab work - CBC with Differential/Platelet - VITAMIN D 25 Hydroxy (Vit-D Deficiency, Fractures)  5. Gastroesophageal reflux disease, esophagitis presence not specified -The patient will try to switch off of the pantoprazole and onto ranitidine or Zantac and he was reminded this has to be taken twice daily before breakfast and supper - CBC with Differential/Platelet - Hepatic function panel  6. Seizure disorder ([place]) -No recent seizure history drug levels pending - Phenobarbital level - Phenytoin level, total - CBC with Differential/Platelet  7. Skin lesion of face -Return to the office  for shave excision of lesion in question  No orders of the defined types were placed in this encounter.   Patient Instructions                       Medicare Annual Wellness Visit  [facility] and the medical providers at [facility] strive to bring you the best medical care.  In doing so we not only want to address your current medical conditions and concerns but also to detect new conditions early and prevent illness, disease and health-related problems.    Medicare offers a yearly Wellness Visit which allows our clinical staff to assess your need for preventative services including immunizations, lifestyle education, counseling to decrease risk of preventable diseases and screening for fall risk and other medical concerns.    This visit is provided free of charge (no copay) for all Medicare recipients. The clinical pharmacists at [facility] have begun to conduct these Wellness Visits which will also include a thorough review of all your medications.    As you primary medical provider recommend that you make an appointment for your Annual Wellness Visit if you have not done so already  this year.  You may set up this appointment before you leave today or you may call back [phone]) and schedule an appointment.  Please make sure when you call that you mention that you are scheduling your Annual Wellness Visit with the clinical pharmacist so that the appointment may be made for the proper length of time.      Continue current medications. Continue good therapeutic lifestyle changes which include good diet and exercise. Fall precautions discussed with patient. If an FOBT was given today- please return it to our front desk. If you are over 6 years old - you may need Prevnar 51 or the adult Pneumonia vaccine.  **Flu shots are available--- please call and schedule a FLU-CLINIC appointment**  After your visit with [place] today you will receive a survey  in the mail or online from [employer] regarding your care with [place]. Please take a moment to fill this out. Your feedback is very important to [place] as you can help [place] better understand your patient needs as well as improve your experience and satisfaction. WE CARE ABOUT YOU!!!   Be sure and call and make an appointment for follow-up with urology Schedule an appointment with one of the providers to remove the lesion at his right mandible area that is in question and remind them that he is taking Plavix. Continue to use the Mucinex and nasal saline and nasal saline gel Keep the house as cool as possible and use a cool mist humidifier   Arrie Senate MD

## 2015-10-15 NOTE — Progress Notes (Unsigned)
Patient ID: Joshua [name], male   DOB: Apr 13, 1932, 82 y.o.   MRN: [id number]  The pharmacologic effects and plasma levels of Dilantin may be increased by Ranitidine. Case studies of using dilatin / phenytoin and ranitidine have shown mixed results with some documented increases in plasma phenytoin levels and others where then were no significant increase in plasma phenytoin level.  Symptoms of toxicity of dilantin / phenytoin are: nystagmus, ataxia and other cerebellar symptoms.  Recommendation:    An alternative that would not affect phenytoin level is pepcid / famotidine - 20 to 40 mg once daily If ranitidine is started recommend check phenytoin level in 2 weeks and again 6 weeks after starting combination.

## 2015-10-15 NOTE — Progress Notes (Signed)
Pepcid AC 20 mg once daily before breakfast per recommendation of clinical pharmacy

## 2015-10-16 ENCOUNTER — Telehealth: Payer: Self-pay | Admitting: Family Medicine

## 2015-10-16 DIAGNOSIS — R7989 Other specified abnormal findings of blood chemistry: Secondary | ICD-10-CM

## 2015-10-16 DIAGNOSIS — H353121 Nonexudative age-related macular degeneration, left eye, early dry stage: Secondary | ICD-10-CM | POA: Diagnosis not present

## 2015-10-16 DIAGNOSIS — R945 Abnormal results of liver function studies: Principal | ICD-10-CM

## 2015-10-16 DIAGNOSIS — H04123 Dry eye syndrome of bilateral lacrimal glands: Secondary | ICD-10-CM | POA: Diagnosis not present

## 2015-10-16 DIAGNOSIS — H02834 Dermatochalasis of left upper eyelid: Secondary | ICD-10-CM | POA: Diagnosis not present

## 2015-10-16 DIAGNOSIS — H02831 Dermatochalasis of right upper eyelid: Secondary | ICD-10-CM | POA: Diagnosis not present

## 2015-10-16 LAB — CBC WITH DIFFERENTIAL/PLATELET
BASOS ABS: 0.1 10*3/uL (ref 0.0–0.2)
BASOS: 1 %
EOS (ABSOLUTE): 0.7 10*3/uL — AB (ref 0.0–0.4)
Eos: 7 %
Hematocrit: 39.3 % (ref 37.5–51.0)
Hemoglobin: 13.8 g/dL (ref 12.6–17.7)
Immature Grans (Abs): 0.1 10*3/uL (ref 0.0–0.1)
Immature Granulocytes: 1 %
LYMPHS ABS: 3 10*3/uL (ref 0.7–3.1)
Lymphs: 27 %
MCH: 30.3 pg (ref 26.6–33.0)
MCHC: 35.1 g/dL (ref 31.5–35.7)
MCV: 86 fL (ref 79–97)
Monocytes Absolute: 0.8 10*3/uL (ref 0.1–0.9)
Monocytes: 7 %
NEUTROS ABS: 6.2 10*3/uL (ref 1.4–7.0)
Neutrophils: 57 %
PLATELETS: 294 10*3/uL (ref 150–379)
RBC: 4.55 x10E6/uL (ref 4.14–5.80)
RDW: 14.1 % (ref 12.3–15.4)
WBC: 10.8 10*3/uL (ref 3.4–10.8)

## 2015-10-16 LAB — HEPATIC FUNCTION PANEL
ALBUMIN: 4.5 g/dL (ref 3.5–4.7)
ALT: 21 IU/L (ref 0–44)
AST: 25 IU/L (ref 0–40)
Alkaline Phosphatase: 139 IU/L — ABNORMAL HIGH (ref 39–117)
Bilirubin Total: 0.4 mg/dL (ref 0.0–1.2)
Bilirubin, Direct: 0.14 mg/dL (ref 0.00–0.40)
TOTAL PROTEIN: 7 g/dL (ref 6.0–8.5)

## 2015-10-16 LAB — NMR, LIPOPROFILE
CHOLESTEROL: 171 mg/dL (ref 100–199)
HDL CHOLESTEROL BY NMR: 57 mg/dL (ref >39)
HDL PARTICLE NUMBER: 29.8 umol/L — AB (ref >=30.5)
LDL PARTICLE NUMBER: 1075 nmol/L — AB (ref <1000)
LDL Size: 20.8 nm (ref >20.5)
LDL-C: 95 mg/dL (ref 0–99)
LP-IR SCORE: 31 (ref <=45)
Small LDL Particle Number: 324 nmol/L (ref <=527)
Triglycerides by NMR: 96 mg/dL (ref 0–149)

## 2015-10-16 LAB — BMP8+EGFR
BUN / CREAT RATIO: 13 (ref 10–22)
BUN: 14 mg/dL (ref 8–27)
CHLORIDE: 101 mmol/L (ref 96–106)
CO2: 25 mmol/L (ref 18–29)
Calcium: 9.8 mg/dL (ref 8.6–10.2)
Creatinine, Ser: 1.04 mg/dL (ref 0.76–1.27)
GFR calc non Af Amer: 67 mL/min/{1.73_m2} (ref >59)
GFR, EST AFRICAN AMERICAN: 77 mL/min/{1.73_m2} (ref >59)
Glucose: 87 mg/dL (ref 65–99)
POTASSIUM: 5 mmol/L (ref 3.5–5.2)
SODIUM: 143 mmol/L (ref 134–144)

## 2015-10-16 LAB — PHENYTOIN LEVEL, TOTAL: PHENYTOIN (DILANTIN), SERUM: 10.7 ug/mL (ref 10.0–20.0)

## 2015-10-16 LAB — PHENOBARBITAL LEVEL: PHENOBARBITAL, SERUM: 30 ug/mL (ref 15–40)

## 2015-10-16 LAB — VITAMIN D 25 HYDROXY (VIT D DEFICIENCY, FRACTURES): Vit D, 25-Hydroxy: 44 ng/mL (ref 30.0–100.0)

## 2015-10-19 NOTE — Telephone Encounter (Signed)
Patient aware of results and us ordered 

## 2015-10-20 ENCOUNTER — Other Ambulatory Visit: Payer: Medicare Other

## 2015-10-20 DIAGNOSIS — Z1212 Encounter for screening for malignant neoplasm of rectum: Secondary | ICD-10-CM

## 2015-10-24 LAB — FECAL OCCULT BLOOD, IMMUNOCHEMICAL: FECAL OCCULT BLD: NEGATIVE

## 2015-10-29 ENCOUNTER — Ambulatory Visit (INDEPENDENT_AMBULATORY_CARE_PROVIDER_SITE_OTHER): Payer: Medicare Other | Admitting: Family Medicine

## 2015-10-29 ENCOUNTER — Encounter: Payer: Self-pay | Admitting: Family Medicine

## 2015-10-29 VITALS — BP 149/78 | HR 66 | Temp 96.4°F | Ht 71.25 in | Wt 193.0 lb

## 2015-10-29 DIAGNOSIS — R7989 Other specified abnormal findings of blood chemistry: Secondary | ICD-10-CM

## 2015-10-29 DIAGNOSIS — R945 Abnormal results of liver function studies: Secondary | ICD-10-CM

## 2015-10-29 DIAGNOSIS — L820 Inflamed seborrheic keratosis: Secondary | ICD-10-CM | POA: Diagnosis not present

## 2015-10-29 NOTE — Addendum Note (Signed)
Addended by: Zannie Cove on: 10/29/2015 12:22 PM   Modules accepted: Orders

## 2015-10-29 NOTE — Progress Notes (Signed)
   HPI  Patient presents today here for eval of skinlesion  Patient states it has been there for months and is not bothering him  He has a Hx of squamous SCC or BCC, No Hx of mela=noma, Has had multiple AKs in the past treated by Derm via cryoptherapy  No fevers, chills, sweats   PMH: Smoking status noted ROS: Per HPI  Objective: BP 149/78 mmHg  Pulse 66  Temp(Src) 96.4 F (35.8 C) (Oral)  Ht 5' 11.25" (1.81 m)  Wt 193 lb (87.544 kg)  BMI 26.72 kg/m2 Gen: NAD, alert, cooperative with exam HEENT: NCAT Neuro: Alert and oriented, No gross deficits Skin:  7 mm in diameter, irregular shaped brown raised  stuck on appearing lesion with bright erythema surrounding it, no DC/induration/or tendernes to palpation  Assessment and plan:  # Inflamed seborrheic keratosis Treated with cryotherapy today Discussed supportive care and usual healing after cryotherapy Follow-up as needed   Laroy Apple, MD [facility] 10/29/2015, 11:44 AM

## 2015-10-30 ENCOUNTER — Other Ambulatory Visit (HOSPITAL_COMMUNITY): Payer: PRIVATE HEALTH INSURANCE

## 2015-11-06 DIAGNOSIS — H02834 Dermatochalasis of left upper eyelid: Secondary | ICD-10-CM | POA: Diagnosis not present

## 2015-11-06 DIAGNOSIS — H04123 Dry eye syndrome of bilateral lacrimal glands: Secondary | ICD-10-CM | POA: Diagnosis not present

## 2015-11-06 DIAGNOSIS — H02831 Dermatochalasis of right upper eyelid: Secondary | ICD-10-CM | POA: Diagnosis not present

## 2015-11-06 DIAGNOSIS — H16103 Unspecified superficial keratitis, bilateral: Secondary | ICD-10-CM | POA: Diagnosis not present

## 2015-11-09 ENCOUNTER — Ambulatory Visit
Admission: RE | Admit: 2015-11-09 | Discharge: 2015-11-09 | Disposition: A | Discharge status: Home / Self Care | Payer: Medicare Other | Source: Ambulatory Visit | Attending: Family Medicine | Admitting: Family Medicine

## 2015-11-09 DIAGNOSIS — K8020 Calculus of gallbladder without cholecystitis without obstruction: Secondary | ICD-10-CM | POA: Diagnosis not present

## 2015-11-09 DIAGNOSIS — R945 Abnormal results of liver function studies: Principal | ICD-10-CM

## 2015-11-09 DIAGNOSIS — R7989 Other specified abnormal findings of blood chemistry: Secondary | ICD-10-CM

## 2015-12-01 ENCOUNTER — Telehealth: Payer: Self-pay | Admitting: Family Medicine

## 2015-12-02 MED ORDER — PHENOBARBITAL 32.4 MG PO TABS: ORAL_TABLET | ORAL | Status: DC

## 2015-12-02 NOTE — Telephone Encounter (Signed)
rx ready- pt aware.

## 2015-12-15 DIAGNOSIS — H02831 Dermatochalasis of right upper eyelid: Secondary | ICD-10-CM | POA: Diagnosis not present

## 2015-12-15 DIAGNOSIS — H02834 Dermatochalasis of left upper eyelid: Secondary | ICD-10-CM | POA: Diagnosis not present

## 2016-01-05 ENCOUNTER — Telehealth: Payer: Self-pay | Admitting: Family Medicine

## 2016-01-05 MED ORDER — PRAVASTATIN SODIUM 80 MG PO TABS: 80.0000 mg | ORAL_TABLET | Freq: Every day | ORAL | Status: DC

## 2016-01-05 NOTE — Telephone Encounter (Signed)
done

## 2016-01-20 ENCOUNTER — Encounter: Payer: Self-pay | Admitting: Family Medicine

## 2016-01-20 ENCOUNTER — Ambulatory Visit (INDEPENDENT_AMBULATORY_CARE_PROVIDER_SITE_OTHER): Payer: Medicare Other | Admitting: Family Medicine

## 2016-01-20 VITALS — BP 126/63 | HR 56 | Temp 96.9°F | Ht 71.25 in | Wt 191.0 lb

## 2016-01-20 DIAGNOSIS — E559 Vitamin D deficiency, unspecified: Secondary | ICD-10-CM

## 2016-01-20 DIAGNOSIS — E7800 Pure hypercholesterolemia, unspecified: Secondary | ICD-10-CM

## 2016-01-20 DIAGNOSIS — C61 Malignant neoplasm of prostate: Secondary | ICD-10-CM

## 2016-01-20 DIAGNOSIS — G40909 Epilepsy, unspecified, not intractable, without status epilepticus: Secondary | ICD-10-CM

## 2016-01-20 DIAGNOSIS — Z87448 Personal history of other diseases of urinary system: Secondary | ICD-10-CM | POA: Diagnosis not present

## 2016-01-20 DIAGNOSIS — K219 Gastro-esophageal reflux disease without esophagitis: Secondary | ICD-10-CM | POA: Diagnosis not present

## 2016-01-20 DIAGNOSIS — I10 Essential (primary) hypertension: Secondary | ICD-10-CM | POA: Diagnosis not present

## 2016-01-20 DIAGNOSIS — R569 Unspecified convulsions: Secondary | ICD-10-CM

## 2016-01-20 DIAGNOSIS — R319 Hematuria, unspecified: Secondary | ICD-10-CM | POA: Diagnosis not present

## 2016-01-20 LAB — URINALYSIS, COMPLETE
Bilirubin, UA: NEGATIVE
Glucose, UA: NEGATIVE
Leukocytes, UA: NEGATIVE
NITRITE UA: NEGATIVE
PH UA: 5.5 (ref 5.0–7.5)
RBC, UA: NEGATIVE
Specific Gravity, UA: 1.025 (ref 1.005–1.030)
UUROB: 0.2 mg/dL (ref 0.2–1.0)

## 2016-01-20 LAB — MICROSCOPIC EXAMINATION: BACTERIA UA: NONE SEEN

## 2016-01-20 NOTE — Progress Notes (Signed)
Subjective:    Patient ID: Domingo Dimes, male    DOB: May 10, 1932, 82 y.o.   MRN: [id number]  HPI Pt here for follow up and management of chronic medical problems which includes GERD and Hypertension. He is taking medications regularly. The patient is doing well with no specific complaints. He continues to follow-up with urology because of his prostate cancer. He'll get his routine lab work today. The patient does describe one episode of blood in the urine that was very prominent one day and gradually tapered off and he is had no more problems since that time. This was 2-3 weeks ago. He also says he is had increased shortness of breath and attributes this to his inactivity physically. He plans to reschedule himself with physical therapy next-door to begin to get more physical activity. He denies any chest pain. He denies any trouble with heartburn except rarely blood in the stool or black tarry bowel movements. He has no nausea vomiting or diarrhea. He is passing his water without problems other than the episode of blood in the urine already mentioned. He sees the urologist regularly. We will encourage him to see the urologist sooner because of this episode of blood in the urine.    Patient Active Problem List   Diagnosis Date Noted  . Vitamin D deficiency 10/15/2015  . Prostate cancer ([place])   . Hypercholesterolemia   . Hypertension   . Seizures ([place])   . GERD 08/22/2008  . COLONIC POLYPS, ADENOMATOUS, HX OF 08/22/2008   Outpatient Encounter Prescriptions as of 01/20/2016  Medication Sig  . calcium carbonate (OS-CAL) 600 MG TABS tablet Take 600 mg by mouth daily with breakfast.  . clopidogrel (PLAVIX) 75 MG tablet Take 1 tablet (75 mg total) by mouth daily.  . Multiple Vitamins-Minerals (ICAPS AREDS FORMULA PO) Take 1 capsule by mouth 2 (two) times daily.  . pantoprazole (PROTONIX) 40 MG tablet TAKE 1 TABLET ONCE DAILY 30 MINUTES BEFORE a MEAL.  Marland Kitchen PHENobarbital (LUMINAL) 32.4 MG tablet  Take 2 tabs in am, 3 tabs pm per pt statement  . phenytoin (DILANTIN) 100 MG ER capsule 2 caps in the morning and 3 caps QHS  . pravastatin (PRAVACHOL) 80 MG tablet Take 1 tablet (80 mg total) by mouth daily.  Marland Kitchen VITAMIN D, ERGOCALCIFEROL, PO Take 600 Units by mouth daily.    No facility-administered encounter medications on file as of 01/20/2016.      Review of Systems  Constitutional: Negative.   HENT: Negative.   Eyes: Negative.   Respiratory: Negative.   Cardiovascular: Negative.   Gastrointestinal: Negative.   Endocrine: Negative.   Genitourinary: Negative.   Musculoskeletal: Negative.   Skin: Negative.   Allergic/Immunologic: Negative.   Neurological: Negative.   Hematological: Negative.   Psychiatric/Behavioral: Negative.        Objective:   Physical Exam  Constitutional: He is oriented to person, place, and time. He appears well-developed and well-nourished. No distress.  HENT:  Head: Normocephalic and atraumatic.  Right Ear: External ear normal.  Left Ear: External ear normal.  Nose: Nose normal.  Mouth/Throat: Oropharynx is clear and moist. No oropharyngeal exudate.  Eyes: Conjunctivae and EOM are normal. Pupils are equal, round, and reactive to light. Right eye exhibits no discharge. Left eye exhibits no discharge. No scleral icterus.  The patient has recently had some upper eyelid surgery because of drooping lids to allow his vision to be better.  Neck: Normal range of motion. Neck supple. No thyromegaly present.  No bruits or thyromegaly  Cardiovascular: Normal rate, regular rhythm, normal heart sounds and intact distal pulses.   No murmur heard. The heart is regular at 60/m  Pulmonary/Chest: Effort normal and breath sounds normal. No respiratory distress. He has no wheezes. He has no rales. He exhibits no tenderness.  Clear anteriorly and posteriorly without axillary adenopathy. In the right base posteriorly there did appear to be some scar tissue that was  audible with inspiration.  Abdominal: Soft. Bowel sounds are normal. He exhibits no mass. There is no tenderness. There is no rebound and no guarding.  No abdominal tenderness masses or organ enlargement  Genitourinary:  The patient sees the urologist regularly.  Musculoskeletal: Normal range of motion. He exhibits no edema or tenderness.  Lymphadenopathy:    He has no cervical adenopathy.  Neurological: He is alert and oriented to person, place, and time. He has normal reflexes. No cranial nerve deficit.  Skin: Skin is warm and dry. No rash noted.  Psychiatric: He has a normal mood and affect. His behavior is normal. Judgment and thought content normal.  Nursing note and vitals reviewed.  BP 126/63 mmHg  Pulse 56  Temp(Src) 96.9 F (36.1 C) (Oral)  Ht 5' 11.25" (1.81 m)  Wt 191 lb (86.637 kg)  BMI 26.45 kg/m2        Assessment & Plan:  1. Essential hypertension -The blood pressure is good today and he will continue with sodium restriction. - BMP8+EGFR - CBC with Differential/Platelet - Hepatic function panel  2. Prostate cancer ([place]) -We will continue to follow-up with the urologist. Because of the recent history of hematuria we will try to get this appointment sooner for him. - CBC with Differential/Platelet  3. Hypercholesterolemia -Continue with pravastatin and as aggressive therapeutic lifestyle changes - CBC with Differential/Platelet - NMR, lipoprofile  4. Vitamin D deficiency -Continue with multivitamins pending results of lab work - CBC with Differential/Platelet - VITAMIN D 25 Hydroxy (Vit-D Deficiency, Fractures)  5. Gastroesophageal reflux disease, esophagitis presence not specified -He will continue with pantoprazole - CBC with Differential/Platelet - Hepatic function panel  6. Seizure disorder ([place]) -Continue with Dilantin and phenobarbital and he's been seizure-free. - CBC with Differential/Platelet - Phenobarbital level - Phenytoin level, free  and total  7. Seizures ([place]) -No history of any recent seizures and patient continues to take his Dilantin and phenobarbital.  8. History of hematuria -We will check a urine today and get an earlier appointment with his urologist for follow-up of this problem. - Urinalysis, Complete - Urine culture  Patient Instructions                       Medicare Annual Wellness Visit  [facility] and the medical providers at [facility] strive to bring you the best medical care.  In doing so we not only want to address your current medical conditions and concerns but also to detect new conditions early and prevent illness, disease and health-related problems.    Medicare offers a yearly Wellness Visit which allows our clinical staff to assess your need for preventative services including immunizations, lifestyle education, counseling to decrease risk of preventable diseases and screening for fall risk and other medical concerns.    This visit is provided free of charge (no copay) for all Medicare recipients. The clinical pharmacists at [facility] have begun to conduct these Wellness Visits which will also include a thorough review of all your medications.  As you primary medical provider recommend that you make an appointment for your Annual Wellness Visit if you have not done so already this year.  You may set up this appointment before you leave today or you may call back [phone]) and schedule an appointment.  Please make sure when you call that you mention that you are scheduling your Annual Wellness Visit with the clinical pharmacist so that the appointment may be made for the proper length of time.     Continue current medications. Continue good therapeutic lifestyle changes which include good diet and exercise. Fall precautions discussed with patient. If an FOBT was given today- please return it to our front desk. If you are over 20 years old -  you may need Prevnar 89 or the adult Pneumonia vaccine.  **Flu shots are available--- please call and schedule a FLU-CLINIC appointment**  After your visit with [place] today you will receive a survey in the mail or online from [employer] regarding your care with [place]. Please take a moment to fill this out. Your feedback is very important to [place] as you can help [place] better understand your patient needs as well as improve your experience and satisfaction. WE CARE ABOUT YOU!!!   We will call and schedule an appointment for the patient to see the urologist because of the episode of blood in the urine He will set up an appointment to resume physical therapy next door at the rehabilitation center. He will try to stay as active as possible. He will also call his insurance company and try to get approval for continuing to take proton X because he is on Plavix Dilantin and phenobarb are.    Arrie Senate MD

## 2016-01-20 NOTE — Patient Instructions (Addendum)
Medicare Annual Wellness Visit  [facility] and the medical providers at [facility] strive to bring you the best medical care.  In doing so we not only want to address your current medical conditions and concerns but also to detect new conditions early and prevent illness, disease and health-related problems.    Medicare offers a yearly Wellness Visit which allows our clinical staff to assess your need for preventative services including immunizations, lifestyle education, counseling to decrease risk of preventable diseases and screening for fall risk and other medical concerns.    This visit is provided free of charge (no copay) for all Medicare recipients. The clinical pharmacists at [facility] have begun to conduct these Wellness Visits which will also include a thorough review of all your medications.    As you primary medical provider recommend that you make an appointment for your Annual Wellness Visit if you have not done so already this year.  You may set up this appointment before you leave today or you may call back [phone] and schedule an appointment.  Please make sure when you call that you mention that you are scheduling your Annual Wellness Visit with the clinical pharmacist so that the appointment may be made for the proper length of time.     Continue current medications. Continue good therapeutic lifestyle changes which include good diet and exercise. Fall precautions discussed with patient. If an FOBT was given today- please return it to our front desk. If you are over 38 years old - you may need Prevnar 9 or the adult Pneumonia vaccine.  **Flu shots are available--- please call and schedule a FLU-CLINIC appointment**  After your visit with [place] today you will receive a survey in the mail or online from [employer] regarding your care with [place]. Please take a moment to fill this out. Your feedback is very  important to [place] as you can help [place] better understand your patient needs as well as improve your experience and satisfaction. WE CARE ABOUT YOU!!!   We will call and schedule an appointment for the patient to see the urologist because of the episode of blood in the urine He will set up an appointment to resume physical therapy next door at the rehabilitation center. He will try to stay as active as possible. He will also call his insurance company and try to get approval for continuing to take proton X because he is on Plavix Dilantin and phenobarb are.

## 2016-01-21 LAB — HEPATIC FUNCTION PANEL
ALT: 19 IU/L (ref 0–44)
AST: 25 IU/L (ref 0–40)
Albumin: 4.5 g/dL (ref 3.5–4.7)
Alkaline Phosphatase: 120 IU/L — ABNORMAL HIGH (ref 39–117)
BILIRUBIN, DIRECT: 0.15 mg/dL (ref 0.00–0.40)
Bilirubin Total: 0.5 mg/dL (ref 0.0–1.2)
Total Protein: 6.9 g/dL (ref 6.0–8.5)

## 2016-01-21 LAB — URINE CULTURE

## 2016-01-21 LAB — NMR, LIPOPROFILE
CHOLESTEROL: 176 mg/dL (ref 100–199)
HDL Cholesterol by NMR: 69 mg/dL (ref >39)
HDL PARTICLE NUMBER: 33.8 umol/L (ref >=30.5)
LDL PARTICLE NUMBER: 1135 nmol/L — AB (ref <1000)
LDL SIZE: 21 nm (ref >20.5)
LDL-C: 90 mg/dL (ref 0–99)
SMALL LDL PARTICLE NUMBER: 375 nmol/L (ref <=527)
Triglycerides by NMR: 85 mg/dL (ref 0–149)

## 2016-01-21 LAB — CBC WITH DIFFERENTIAL/PLATELET
BASOS: 1 %
Basophils Absolute: 0.1 10*3/uL (ref 0.0–0.2)
EOS (ABSOLUTE): 0.7 10*3/uL — AB (ref 0.0–0.4)
Eos: 8 %
Hematocrit: 41.6 % (ref 37.5–51.0)
Hemoglobin: 13.8 g/dL (ref 12.6–17.7)
IMMATURE GRANS (ABS): 0 10*3/uL (ref 0.0–0.1)
Immature Granulocytes: 0 %
LYMPHS ABS: 2.3 10*3/uL (ref 0.7–3.1)
Lymphs: 25 %
MCH: 29.6 pg (ref 26.6–33.0)
MCHC: 33.2 g/dL (ref 31.5–35.7)
MCV: 89 fL (ref 79–97)
MONOS ABS: 0.9 10*3/uL (ref 0.1–0.9)
Monocytes: 10 %
Neutrophils Absolute: 5.2 10*3/uL (ref 1.4–7.0)
Neutrophils: 56 %
PLATELETS: 268 10*3/uL (ref 150–379)
RBC: 4.67 x10E6/uL (ref 4.14–5.80)
RDW: 13.6 % (ref 12.3–15.4)
WBC: 9.2 10*3/uL (ref 3.4–10.8)

## 2016-01-21 LAB — PHENYTOIN LEVEL, FREE AND TOTAL
Phenytoin, Free: 0.6 ug/mL — ABNORMAL LOW (ref 1.0–2.0)
Phenytoin, Serum: 8.3 ug/mL — ABNORMAL LOW (ref 10.0–20.0)

## 2016-01-21 LAB — BMP8+EGFR
BUN/Creatinine Ratio: 22 (ref 10–24)
BUN: 20 mg/dL (ref 8–27)
CALCIUM: 9.8 mg/dL (ref 8.6–10.2)
CO2: 24 mmol/L (ref 18–29)
Chloride: 103 mmol/L (ref 96–106)
Creatinine, Ser: 0.9 mg/dL (ref 0.76–1.27)
GFR calc non Af Amer: 80 mL/min/{1.73_m2} (ref >59)
GFR, EST AFRICAN AMERICAN: 92 mL/min/{1.73_m2} (ref >59)
Glucose: 97 mg/dL (ref 65–99)
POTASSIUM: 4.6 mmol/L (ref 3.5–5.2)
Sodium: 145 mmol/L — ABNORMAL HIGH (ref 134–144)

## 2016-01-21 LAB — PHENOBARBITAL LEVEL: Phenobarbital, Serum: 31 ug/mL (ref 15–40)

## 2016-01-21 LAB — VITAMIN D 25 HYDROXY (VIT D DEFICIENCY, FRACTURES): Vit D, 25-Hydroxy: 43.9 ng/mL (ref 30.0–100.0)

## 2016-01-22 ENCOUNTER — Ambulatory Visit: Payer: Medicare Other | Admitting: Family Medicine

## 2016-01-22 ENCOUNTER — Other Ambulatory Visit: Payer: Self-pay | Admitting: *Deleted

## 2016-01-22 DIAGNOSIS — G40909 Epilepsy, unspecified, not intractable, without status epilepticus: Secondary | ICD-10-CM

## 2016-01-22 DIAGNOSIS — R899 Unspecified abnormal finding in specimens from other organs, systems and tissues: Secondary | ICD-10-CM

## 2016-01-27 ENCOUNTER — Other Ambulatory Visit: Payer: Self-pay | Admitting: Pharmacist

## 2016-01-27 MED ORDER — PANTOPRAZOLE SODIUM 40 MG PO TBEC: DELAYED_RELEASE_TABLET | ORAL | Status: DC

## 2016-01-28 NOTE — Addendum Note (Signed)
Addended by: Thana Ates on: 01/28/2016 09:19 AM   Modules accepted: Orders

## 2016-02-11 ENCOUNTER — Other Ambulatory Visit: Payer: Medicare Other

## 2016-02-11 DIAGNOSIS — R569 Unspecified convulsions: Secondary | ICD-10-CM | POA: Diagnosis not present

## 2016-02-12 ENCOUNTER — Telehealth: Payer: Self-pay | Admitting: Family Medicine

## 2016-02-12 LAB — PHENYTOIN LEVEL, FREE AND TOTAL
Phenytoin, Free: 0.8 ug/mL — ABNORMAL LOW (ref 1.0–2.0)
Phenytoin, Serum: 11.5 ug/mL (ref 10.0–20.0)

## 2016-02-12 MED ORDER — CLOPIDOGREL BISULFATE 75 MG PO TABS: 75.0000 mg | ORAL_TABLET | Freq: Every day | ORAL | Status: DC

## 2016-02-12 NOTE — Telephone Encounter (Signed)
done

## 2016-02-17 DIAGNOSIS — Z8546 Personal history of malignant neoplasm of prostate: Secondary | ICD-10-CM | POA: Diagnosis not present

## 2016-02-17 DIAGNOSIS — R310 Gross hematuria: Secondary | ICD-10-CM | POA: Diagnosis not present

## 2016-02-17 DIAGNOSIS — Z0000 Encounter for general adult medical examination without abnormal findings: Secondary | ICD-10-CM | POA: Diagnosis not present

## 2016-02-22 ENCOUNTER — Encounter: Payer: Self-pay | Admitting: *Deleted

## 2016-02-23 DIAGNOSIS — L570 Actinic keratosis: Secondary | ICD-10-CM | POA: Diagnosis not present

## 2016-02-23 DIAGNOSIS — D485 Neoplasm of uncertain behavior of skin: Secondary | ICD-10-CM | POA: Diagnosis not present

## 2016-03-15 ENCOUNTER — Ambulatory Visit (INDEPENDENT_AMBULATORY_CARE_PROVIDER_SITE_OTHER): Payer: Medicare Other | Admitting: Nurse Practitioner

## 2016-03-15 ENCOUNTER — Encounter: Payer: Self-pay | Admitting: Nurse Practitioner

## 2016-03-15 VITALS — BP 139/83 | HR 69 | Temp 97.2°F | Ht 71.25 in | Wt 193.0 lb

## 2016-03-15 DIAGNOSIS — Z4802 Encounter for removal of sutures: Secondary | ICD-10-CM | POA: Diagnosis not present

## 2016-03-15 NOTE — Progress Notes (Signed)
   Subjective:    Patient ID: Joshua [name], male    DOB: 12/24/1931, 82 y.o.   MRN: [id number]  HPI Patient comes in today for stitch removal. He fail while traveling in [place]    ( 03/08/16 )and landed on his face. It did not knock him out. He went to hospital there where stitches were inserted  In his eyebrow. Says that area bleeds everytime he touches it. He is doing well. Denies any headache or blurred vision.    Review of Systems  Constitutional: Negative.   HENT: Negative.   Respiratory: Negative.   Cardiovascular: Negative.   Genitourinary: Negative.   Neurological: Negative.   Psychiatric/Behavioral: Negative.   All other systems reviewed and are negative.      Objective:   Physical Exam  Constitutional: He is oriented to person, place, and time. He appears well-developed and well-nourished. No distress.  Cardiovascular: Normal rate, regular rhythm and normal heart sounds.   Pulmonary/Chest: Effort normal.  Neurological: He is alert and oriented to person, place, and time.  Skin: Skin is warm.  Wound edge right eyebrow notb well approximated-  Lots of scan=bbing-no erythema  Or signs of infection. Echymosis all over face- no edema.    * 8 stitches removed and wound reopened- steri strips applied.      Assessment & Plan:   1. Visit for suture removal    Steri strip care discussed Do not pick or scratch at area RTO prn  Mary-Margaret Hassell Done, FNP

## 2016-03-23 ENCOUNTER — Telehealth: Payer: Self-pay | Admitting: Family Medicine

## 2016-03-23 NOTE — Telephone Encounter (Signed)
[place] and fell on a marble floor face first. Patient had to get stiches above his eye that MMM took out but patient would like Dr.Moore to look at it. Also patient states that he has a URI and would like an appointment with Dr.Moore. Patient knows that he is booked and states that if I told Roselyn Reef what was going on that she might work him in. Aware you have left for the day.

## 2016-03-24 ENCOUNTER — Ambulatory Visit (INDEPENDENT_AMBULATORY_CARE_PROVIDER_SITE_OTHER): Payer: Medicare Other | Admitting: Pediatrics

## 2016-03-24 ENCOUNTER — Encounter: Payer: Self-pay | Admitting: Pediatrics

## 2016-03-24 VITALS — BP 136/65 | HR 74 | Temp 98.6°F | Ht 71.25 in | Wt 191.4 lb

## 2016-03-24 DIAGNOSIS — W19XXXD Unspecified fall, subsequent encounter: Secondary | ICD-10-CM | POA: Diagnosis not present

## 2016-03-24 DIAGNOSIS — I10 Essential (primary) hypertension: Secondary | ICD-10-CM

## 2016-03-24 DIAGNOSIS — J069 Acute upper respiratory infection, unspecified: Secondary | ICD-10-CM | POA: Diagnosis not present

## 2016-03-24 DIAGNOSIS — R569 Unspecified convulsions: Secondary | ICD-10-CM | POA: Diagnosis not present

## 2016-03-24 DIAGNOSIS — J189 Pneumonia, unspecified organism: Secondary | ICD-10-CM | POA: Diagnosis not present

## 2016-03-24 MED ORDER — AZITHROMYCIN 250 MG PO TABS: ORAL_TABLET | ORAL | Status: DC

## 2016-03-24 NOTE — Telephone Encounter (Signed)
appt made pt aware

## 2016-03-24 NOTE — Progress Notes (Signed)
    Subjective:    Patient ID: Joshua [name], male    DOB: June 19, 1932, 82 y.o.   MRN: [id number]  CC: Nasal Congestion; Hoarse; and Cough   HPI: Joshua [name] is a 82 y.o. 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 Had focal seizures in R leg On plavix Doesn't remember everything that happened but was told he was awake Remembers EMT coming for him, then remembers getting to the hospital No seizures witnessed On plavix for past 20 yrs, started for vertigo symptoms  Feeling weak in both legs past 3-4 weeks Has had URI symptoms for about a week Now coughing regularly, has been coughing phelgm up. No fevers but feeling very run down   Depression screen [facility] 2/9 03/24/2016 03/15/2016 01/20/2016 10/29/2015 10/15/2015  Decreased Interest 0 0 0 0 0  Down, Depressed, Hopeless 0 0 0 0 0  PHQ - 2 Score 0 0 0 0 0     Relevant past medical, surgical, family and social history reviewed and updated as indicated.  Interim medical history since our last visit reviewed. Allergies and medications reviewed and updated.  ROS: Per HPI unless specifically indicated above  History  Smoking status  . Former Smoker -- 2.00 packs/day for 15 years  . Quit date: 01/15/1973  Smokeless tobacco  . Not on file       Objective:    BP 136/65 mmHg  Pulse 74  Temp(Src) 98.6 F (37 C) (Oral)  Ht 5' 11.25" (1.81 m)  Wt 191 lb 6.4 oz (86.818 kg)  BMI 26.50 kg/m2  Wt Readings from Last 3 Encounters:  03/24/16 191 lb 6.4 oz (86.818 kg)  03/15/16 193 lb (87.544 kg)  01/20/16 191 lb (86.637 kg)     Gen: NAD, alert, cooperative with exam, NCAT EYES: EOMI, no scleral injection or icterus ENT:  TMs pearly gray b/l, OP without  erythema LYMPH: no cervical LAD CV: NRRR, normal S1/S2, no murmur, distal pulses 2+ b/l Resp: Crackles present L base, no wheezes, normal WOB Abd: +BS, soft, NTND.  Ext: 1+ edema b/l LE, R>L, warm Neuro: Alert and oriented, 4/5 strength R hamstring, 5/5 L, quad 5/5 b/l.  Skin: healing laceration over R eye brow     Assessment & Plan:    Joshua [name] was seen today for nasal congestion, hoarse and cough.  Diagnoses and all orders for this visit:  Fall, subsequent encounter Laceration healing, apprx 5mm between edges at medial end. No surrounding erythema Remains weak R leg compared with left, pt says is baseline for him Rec stopping plavix, no current indication for it and pt with frequent falls.  CAP (community acquired pneumonia) -     azithromycin (ZITHROMAX) 250 MG tablet; Take 2 the first day and then one each day after.  Seizures ([place]) Pt says he was told to cont phenobarb/dilantin for rest of life Started in 1980s after AVM removal craniotomy If falls continue, consider stopping one or both  Essential hypertension Well controlled today, cont current meds    Follow up plan: Return in about 4 weeks (around 04/21/2016).  Assunta Found, MD [facility] 03/24/2016, 11:50 AM

## 2016-03-24 NOTE — Patient Instructions (Signed)
Come back sooner if needed.

## 2016-04-05 DIAGNOSIS — Z85828 Personal history of other malignant neoplasm of skin: Secondary | ICD-10-CM | POA: Diagnosis not present

## 2016-04-05 DIAGNOSIS — L570 Actinic keratosis: Secondary | ICD-10-CM | POA: Diagnosis not present

## 2016-04-06 DIAGNOSIS — R310 Gross hematuria: Secondary | ICD-10-CM | POA: Diagnosis not present

## 2016-04-06 DIAGNOSIS — Z8546 Personal history of malignant neoplasm of prostate: Secondary | ICD-10-CM | POA: Diagnosis not present

## 2016-04-06 DIAGNOSIS — C672 Malignant neoplasm of lateral wall of bladder: Secondary | ICD-10-CM | POA: Diagnosis not present

## 2016-04-08 ENCOUNTER — Other Ambulatory Visit: Payer: Self-pay | Admitting: Urology

## 2016-04-12 ENCOUNTER — Other Ambulatory Visit: Payer: Self-pay | Admitting: Family Medicine

## 2016-04-12 MED ORDER — PRAVASTATIN SODIUM 80 MG PO TABS: 80.0000 mg | ORAL_TABLET | Freq: Every day | ORAL | 0 refills | Status: DC

## 2016-04-12 MED ORDER — SODIUM CHLORIDE 0.9 % IV SOLN: 50.0000 mg | Freq: Once | INTRAVENOUS | Status: DC

## 2016-04-12 NOTE — Telephone Encounter (Signed)
Patient aware that rx sent to pharmacy. 

## 2016-04-14 NOTE — Patient Instructions (Addendum)
Joshua [name]  04/14/2016   Your procedure is scheduled [id number] 04/19/2016   Report to [facility]  Entrance take [place]  elevators to 3rd floor to  [place] at  0700 AM.  Call this number if you have problems the morning of [name] [phone]   Remember: ONLY 1 PERSON MAY GO WITH YOU TO SHORT STAY TO GET  READY MORNING OF Joshua [name].   Do not eat food or drink liquids :After Midnight.     Take these medicines the morning of [name] with A SIP OF WATER: Dilantin, Phenobarbital, Protonix                                 You may not have any metal on your body including hair pins and              piercings  Do not wear jewelry, make-up, lotions, powders or perfumes, deodorant             Do not wear nail polish.  Do not shave  48 hours prior to [name].              Men may shave face and neck.   Do not bring valuables to the hospital. Joshua [name].  Contacts, dentures or bridgework may not be worn into [name].  Leave suitcase in the car. After [name] it may be brought to your room.     Patients discharged the day of [name] will not be allowed to drive home.  Name and phone number of your driver:son- [name]  Special Instructions: N/A              Please read over the following fact sheets you were given: _____________________________________________________________________             Joshua [name] Before [name], you can play an important role.  Because skin is not sterile, your skin needs to be as free of germs as possible.  You can reduce the number of germs on your skin by washing with CHG (chlorahexidine gluconate) soap before [name].  CHG is an antiseptic cleaner which kills germs and bonds with the skin to continue killing germs even after washing. Please DO NOT use if you have an allergy to CHG or antibacterial soaps.  If your skin becomes reddened/irritated  stop using the CHG and inform your nurse when you arrive at Short Stay. Do not shave (including legs and underarms) for at least 48 hours prior to the first CHG shower.  You may shave your face/neck. Please follow these instructions carefully:  1.  Shower with CHG Soap the night before [name] and the  morning of [name].  2.  If you choose to wash your hair, wash your hair first as usual with your  normal  shampoo.  3.  After you shampoo, rinse your hair and body thoroughly to remove the  shampoo.                           4.  Use CHG as you would any other liquid soap.  You can apply chg directly  to the skin and wash  Gently with a scrungie or clean washcloth.  5.  Apply the CHG Soap to your body ONLY FROM THE NECK DOWN.   Do not use on face/ open                           Wound or open sores. Avoid contact with eyes, ears mouth and genitals (private parts).                       Wash face,  Genitals (private parts) with your normal soap.             6.  Wash thoroughly, paying special attention to the area where your [name]  will be performed.  7.  Thoroughly rinse your body with warm water from the neck down.  8.  DO NOT shower/wash with your normal soap after using and rinsing off  the CHG Soap.                9.  Pat yourself dry with a clean towel.            10.  Wear clean pajamas.            11.  Place clean sheets on your bed the night of your first shower and do not  sleep with pets. Day of [name] : Do not apply any lotions/deodorants the morning of [name].  Please wear clean clothes to the hospital/[name] center.  FAILURE TO FOLLOW THESE INSTRUCTIONS MAY RESULT IN THE CANCELLATION OF YOUR [name] PATIENT SIGNATURE_________________________________  NURSE SIGNATURE__________________________________  ________________________________________________________________________

## 2016-04-15 ENCOUNTER — Encounter (HOSPITAL_COMMUNITY)
Admission: RE | Admit: 2016-04-15 | Discharge: 2016-04-15 | Disposition: A | Discharge status: Home / Self Care | Payer: Medicare Other | Source: Ambulatory Visit | Attending: Urology | Admitting: Urology

## 2016-04-15 ENCOUNTER — Encounter (HOSPITAL_COMMUNITY): Payer: Self-pay

## 2016-04-15 DIAGNOSIS — Z01812 Encounter for preprocedural laboratory examination: Secondary | ICD-10-CM | POA: Insufficient documentation

## 2016-04-15 DIAGNOSIS — D494 Neoplasm of unspecified behavior of bladder: Secondary | ICD-10-CM | POA: Diagnosis not present

## 2016-04-15 DIAGNOSIS — R001 Bradycardia, unspecified: Secondary | ICD-10-CM | POA: Diagnosis not present

## 2016-04-15 DIAGNOSIS — Z01818 Encounter for other preprocedural examination: Secondary | ICD-10-CM | POA: Insufficient documentation

## 2016-04-15 DIAGNOSIS — I10 Essential (primary) hypertension: Secondary | ICD-10-CM | POA: Diagnosis not present

## 2016-04-15 DIAGNOSIS — I440 Atrioventricular block, first degree: Secondary | ICD-10-CM | POA: Insufficient documentation

## 2016-04-15 LAB — BASIC METABOLIC PANEL
ANION GAP: 7 (ref 5–15)
BUN: 12 mg/dL (ref 6–20)
CALCIUM: 9.4 mg/dL (ref 8.9–10.3)
CO2: 26 mmol/L (ref 22–32)
CREATININE: 0.86 mg/dL (ref 0.61–1.24)
Chloride: 107 mmol/L (ref 101–111)
Glucose, Bld: 97 mg/dL (ref 65–99)
Potassium: 4.5 mmol/L (ref 3.5–5.1)
Sodium: 140 mmol/L (ref 135–145)

## 2016-04-15 LAB — CBC
HCT: 38.7 % — ABNORMAL LOW (ref 39.0–52.0)
HEMOGLOBIN: 13 g/dL (ref 13.0–17.0)
MCH: 29.7 pg (ref 26.0–34.0)
MCHC: 33.6 g/dL (ref 30.0–36.0)
MCV: 88.6 fL (ref 78.0–100.0)
PLATELETS: 294 10*3/uL (ref 150–400)
RBC: 4.37 MIL/uL (ref 4.22–5.81)
RDW: 13.8 % (ref 11.5–15.5)
WBC: 9.2 10*3/uL (ref 4.0–10.5)

## 2016-04-18 ENCOUNTER — Encounter: Payer: Self-pay | Admitting: Family Medicine

## 2016-04-18 DIAGNOSIS — C679 Malignant neoplasm of bladder, unspecified: Secondary | ICD-10-CM | POA: Insufficient documentation

## 2016-04-18 NOTE — H&P (Signed)
CC/HPI: I have blood in my urine.     Joshua [name] returns today for cystoscopy to complete his hematuria evaluation. He has had no more hematuria since the episode 2-3 months ago.     CC: I have bladder cancer.  HPI: Joshua [name] is a 82 year-old male established patient who is here for bladder cancer.      CC/HPI: I have prostate cancer.     He has had a prior seed implant and his PSA in May was 0.02.    ALLERGIES: Penicillins - rash    MEDICATIONS: Calcium 500 TABS Oral  Dilantin 100 MG Oral Capsule Oral  PHENobarbital 32.4 MG Oral Tablet Oral  Plavix 75 MG Oral Tablet Oral  Pravastatin Sodium 80 MG Oral Tablet Oral  Protonix 40 MG Oral Tablet Delayed Release Oral  Vitamin D TABS Oral     GU PSH: TRANSPERI NEEDLE PLACE, PROS - Jan 09, 2012      PSH Notes: Blepharoplasty Upper Lid W/ Excessive Skin, Cataract Surgery, Surgery Prostate Transperineal Placement Of Needles, Craniotomy (Therapeutic), Appendectomy   NON-GU PSH: Appendectomy - 01-09-12 Revise Upper Eyelid - 02/17/2016    GU PMH: Gross hematuria, Gross hematuria - 02/17/2016 History of prostate cancer, History of prostate cancer - 02/17/2016, History of malignant neoplasm of prostate, - 02/12/2015 Urinary Retention, Unspec, Incomplete bladder emptying - 02/12/2015 Nodular prostate w/ LUTS, Nodular prostate with lower urinary tract symptoms - 10/23/2014 Urgency of urination, Urinary urgency - 01-08-14      PMH Notes:  2011-09-27 15:39:05 - Note: Arteriovenous Malformation (CNS)  2011-09-27 15:39:05 - Note: Seizure   NON-GU PMH: Personal history of other specified conditions, History of urinary frequency - 02/12/2015 Encounter for general adult medical examination without abnormal findings, Encounter for preventive health examination - 10/23/2014 Personal history of other diseases of the circulatory system, History of hypertension - 08-Jan-2013 Personal history of other diseases of the digestive system, History of esophageal reflux -  01/08/2013 Personal history of other endocrine, nutritional and metabolic disease, History of hypercholesterolemia - 01-08-13    FAMILY HISTORY: Death In The Family Father - Runs In Family Death In The Family Mother - Runs In Family Emphysema - Father Family Health Status Number - Runs In Family Stroke Syndrome - Mother   SOCIAL HISTORY: Marital Status: Widowed Current Smoking Status: Patient does not smoke anymore. Has not smoked since 03/24/2016.  Patient's occupation Engineer, mining.     Notes: Tobacco use, Former smoker, Alcohol Use, Marital History - Widowed, Occupation:, Caffeine Use   REVIEW OF SYSTEMS:    GU Review Male:   Patient reports hard to postpone urination and get up at night to urinate. Patient denies frequent urination, burning/ pain with urination, leakage of urine, stream starts and stops, trouble starting your stream, have to strain to urinate , erection problems, and penile pain.  Gastrointestinal (Upper):   Patient denies nausea, vomiting, and indigestion/ heartburn.  Gastrointestinal (Lower):   Patient denies diarrhea and constipation.  Constitutional:   Patient denies fever, night sweats, weight loss, and fatigue.  Skin:   Patient denies skin rash/ lesion and itching.  Eyes:   Patient denies blurred vision and double vision.  Ears/ Nose/ Throat:   Patient denies sore throat and sinus problems.  Hematologic/Lymphatic:   Patient reports easy bruising. Patient denies swollen glands.  Cardiovascular:   Patient denies leg swelling and chest pains.  Respiratory:   Patient denies cough and shortness of breath.  Endocrine:   Patient denies excessive thirst.  Musculoskeletal:  Patient denies back pain and joint pain.  Neurological:   Patient denies headaches and dizziness.  Psychologic:   Patient denies depression and anxiety.   VITAL SIGNS:      04/06/2016 12:01 PM  BP 141/76 mmHg  Pulse 58 /min  Temperature 97.1 F / 36 C   MULTI-SYSTEM PHYSICAL EXAMINATION:     Constitutional: Well-nourished. No physical deformities. Normally developed. Good grooming.  Respiratory: No labored breathing, no use of accessory muscles. CTA  Cardiovascular: Normal temperature, no edema, RRR without murmur.      PAST DATA REVIEWED:  Source Of History:  Patient  X-Ray Review: Renal Ultrasound: Reviewed Films. Reviewed Report. He had a normal [place] in 2/17.    PROCEDURES:         Flexible Cystoscopy - 52000  Risks, benefits, and some of the potential complications of the procedure were discussed. 10ml of 2% lidocaine jelly was instilled intraurethrally.  Cipro 500mg  given for antibiotic prophylaxis.     Meatus:  Normal size. Normal location. Normal condition.  Urethra:  No strictures.  External Sphincter:  Normal.  Verumontanum:  Normal.  Prostate:  Non-obstructing. No hyperplasia. There are some radiation changes  Bladder Neck:  Non-obstructing.  Ureteral Orifices:  Normal location. Normal size. Normal shape. Effluxed clear urine.  Bladder:  Mild trabeculation. A left lateral wall tumor. 2 1/2 cm tumor. Multifocal tumors. Normal mucosa. No stones.      The procedure was well tolerated and there were no complications.         Urinalysis - 81003 Dipstick Dipstick Cont'd  Specimen: Voided Bilirubin: Neg  Color: Amber Ketones: Neg  Appearance: Clear Blood: Neg  Specific Gravity: 1.015 Protein: Neg  pH: 7.0 Urobilinogen: 0.2  Glucose: Neg Nitrites: Neg    Leukocyte Esterase: Neg    ASSESSMENT:      ICD-10 Details  1 GU:   Gross hematuria - R31.0 Improving - He has had no further bleeding.  2   Bladder Cancer Lateral - C67.2 He has a multifocal UCa on the left lateral wall that is about 2.5-3cm and appears superficial.  3   History of prostate cancer - Z85.46 Stable - His recent PSA remains very low.   PLAN:           Schedule Return Visit: Next Available Appointment - Schedule Surgery  Procedure: Unspecified Date - Cystoscopy TURBT 2-5 cm -  52235 Notes: next avail  Procedure: Unspecified Date - Bladder Instill AntiCA Agent [phone] Notes: next avail          Document Letter(s):  Created for Patient: Clinical Summary         Notes:   He has a left lateral wall tumor and needs resection.   I am going to set him up for cystoscopy with retrogrades, TURBT and instillation of Epirubicin. I reviewed the risks of bleeding,infection, bladder or adjacent structure injury, need for secondary procedures, chemical cystitis, thrombotic events and anesthetic complications. I will have him hold the plavix preop.   CC: Dr. Morrie Sheldon.

## 2016-04-19 ENCOUNTER — Encounter (HOSPITAL_COMMUNITY)
Admission: RE | Disposition: A | Discharge status: Home / Self Care | Payer: Self-pay | Source: Ambulatory Visit | Attending: Urology

## 2016-04-19 ENCOUNTER — Ambulatory Visit (HOSPITAL_COMMUNITY): Payer: Medicare Other | Admitting: Anesthesiology

## 2016-04-19 ENCOUNTER — Encounter (HOSPITAL_COMMUNITY): Payer: Self-pay | Admitting: *Deleted

## 2016-04-19 ENCOUNTER — Ambulatory Visit (HOSPITAL_COMMUNITY)
Admission: RE | Admit: 2016-04-19 | Discharge: 2016-04-19 | Disposition: A | Discharge status: Home / Self Care | Payer: Medicare Other | Source: Ambulatory Visit | Attending: Urology | Admitting: Urology

## 2016-04-19 DIAGNOSIS — E7800 Pure hypercholesterolemia, unspecified: Secondary | ICD-10-CM | POA: Diagnosis not present

## 2016-04-19 DIAGNOSIS — R569 Unspecified convulsions: Secondary | ICD-10-CM | POA: Insufficient documentation

## 2016-04-19 DIAGNOSIS — K219 Gastro-esophageal reflux disease without esophagitis: Secondary | ICD-10-CM | POA: Insufficient documentation

## 2016-04-19 DIAGNOSIS — Z7902 Long term (current) use of antithrombotics/antiplatelets: Secondary | ICD-10-CM | POA: Insufficient documentation

## 2016-04-19 DIAGNOSIS — C672 Malignant neoplasm of lateral wall of bladder: Secondary | ICD-10-CM | POA: Diagnosis not present

## 2016-04-19 DIAGNOSIS — N3289 Other specified disorders of bladder: Secondary | ICD-10-CM | POA: Diagnosis not present

## 2016-04-19 DIAGNOSIS — Z87891 Personal history of nicotine dependence: Secondary | ICD-10-CM | POA: Insufficient documentation

## 2016-04-19 DIAGNOSIS — Z8546 Personal history of malignant neoplasm of prostate: Secondary | ICD-10-CM | POA: Insufficient documentation

## 2016-04-19 DIAGNOSIS — I10 Essential (primary) hypertension: Secondary | ICD-10-CM | POA: Insufficient documentation

## 2016-04-19 DIAGNOSIS — Z79899 Other long term (current) drug therapy: Secondary | ICD-10-CM | POA: Insufficient documentation

## 2016-04-19 DIAGNOSIS — R310 Gross hematuria: Secondary | ICD-10-CM | POA: Diagnosis present

## 2016-04-19 HISTORY — PX: CYSTOSCOPY W/ RETROGRADES: SHX1426

## 2016-04-19 SURGERY — CYSTOSCOPY, WITH RETROGRADE PYELOGRAM
Anesthesia: General | Laterality: Bilateral

## 2016-04-19 MED ORDER — ONDANSETRON HCL 4 MG/2ML IJ SOLN
INTRAMUSCULAR | Status: AC
  Filled 2016-04-19: qty 2

## 2016-04-19 MED ORDER — BELLADONNA ALKALOIDS-OPIUM 16.2-60 MG RE SUPP
RECTAL | Status: DC | PRN
  Administered 2016-04-19: 1 via RECTAL

## 2016-04-19 MED ORDER — SODIUM CHLORIDE 0.9 % IV SOLN: 250.0000 mL | INTRAVENOUS | Status: DC | PRN

## 2016-04-19 MED ORDER — ONDANSETRON HCL 4 MG/2ML IJ SOLN
INTRAMUSCULAR | Status: DC | PRN
  Administered 2016-04-19: 4 mg via INTRAVENOUS

## 2016-04-19 MED ORDER — PROPOFOL 10 MG/ML IV BOLUS
INTRAVENOUS | Status: AC
  Filled 2016-04-19: qty 20

## 2016-04-19 MED ORDER — PHENYLEPHRINE HCL 10 MG/ML IJ SOLN
INTRAMUSCULAR | Status: DC | PRN
  Administered 2016-04-19: 80 ug via INTRAVENOUS

## 2016-04-19 MED ORDER — PHENYLEPHRINE 40 MCG/ML (10ML) SYRINGE FOR IV PUSH (FOR BLOOD PRESSURE SUPPORT)
PREFILLED_SYRINGE | INTRAVENOUS | Status: AC
  Filled 2016-04-19: qty 10

## 2016-04-19 MED ORDER — BELLADONNA ALKALOIDS-OPIUM 16.2-60 MG RE SUPP
RECTAL | Status: AC
Start: 2016-04-19 — End: 2016-04-19
  Filled 2016-04-19: qty 1

## 2016-04-19 MED ORDER — CIPROFLOXACIN IN D5W 400 MG/200ML IV SOLN
400.0000 mg | INTRAVENOUS | Status: AC
  Administered 2016-04-19: 400 mg via INTRAVENOUS

## 2016-04-19 MED ORDER — LACTATED RINGERS IV SOLN
INTRAVENOUS | Status: DC
  Administered 2016-04-19 (×2): via INTRAVENOUS

## 2016-04-19 MED ORDER — HYDROMORPHONE HCL 1 MG/ML IJ SOLN
INTRAMUSCULAR | Status: DC
Start: 2016-04-19 — End: 2016-04-19
  Filled 2016-04-19: qty 1

## 2016-04-19 MED ORDER — ONDANSETRON HCL 4 MG/2ML IJ SOLN: 4.0000 mg | Freq: Once | INTRAMUSCULAR | Status: DC | PRN

## 2016-04-19 MED ORDER — SODIUM CHLORIDE 0.9 % IJ SOLN
INTRAMUSCULAR | Status: AC
  Filled 2016-04-19: qty 10

## 2016-04-19 MED ORDER — ACETAMINOPHEN 650 MG RE SUPP
650.0000 mg | RECTAL | Status: DC | PRN
  Filled 2016-04-19: qty 1

## 2016-04-19 MED ORDER — MEPERIDINE HCL 50 MG/ML IJ SOLN: 6.2500 mg | INTRAMUSCULAR | Status: DC | PRN

## 2016-04-19 MED ORDER — FENTANYL CITRATE (PF) 100 MCG/2ML IJ SOLN
INTRAMUSCULAR | Status: AC
  Filled 2016-04-19: qty 2

## 2016-04-19 MED ORDER — SODIUM CHLORIDE 0.9 % IR SOLN
Status: DC | PRN
  Administered 2016-04-19: 9000 mL via INTRAVESICAL

## 2016-04-19 MED ORDER — SODIUM CHLORIDE 0.9 % IV SOLN
50.0000 mg | Freq: Once | INTRAVENOUS | Status: AC
  Administered 2016-04-19: 50 mg via INTRAVESICAL
  Filled 2016-04-19: qty 25

## 2016-04-19 MED ORDER — HYDROCODONE-ACETAMINOPHEN 5-325 MG PO TABS: 1.0000 | ORAL_TABLET | Freq: Four times a day (QID) | ORAL | 0 refills | Status: DC | PRN

## 2016-04-19 MED ORDER — HYDROMORPHONE HCL 1 MG/ML IJ SOLN
0.2500 mg | INTRAMUSCULAR | Status: DC | PRN
  Administered 2016-04-19 (×2): 0.5 mg via INTRAVENOUS

## 2016-04-19 MED ORDER — DEXAMETHASONE SODIUM PHOSPHATE 10 MG/ML IJ SOLN
INTRAMUSCULAR | Status: AC
  Filled 2016-04-19: qty 1

## 2016-04-19 MED ORDER — LIDOCAINE HCL (CARDIAC) 20 MG/ML IV SOLN
INTRAVENOUS | Status: AC
  Filled 2016-04-19: qty 5

## 2016-04-19 MED ORDER — SUGAMMADEX SODIUM 200 MG/2ML IV SOLN
INTRAVENOUS | Status: DC | PRN
  Administered 2016-04-19: 200 mg via INTRAVENOUS

## 2016-04-19 MED ORDER — ROCURONIUM BROMIDE 100 MG/10ML IV SOLN
INTRAVENOUS | Status: DC | PRN
  Administered 2016-04-19: 50 mg via INTRAVENOUS

## 2016-04-19 MED ORDER — ROCURONIUM BROMIDE 100 MG/10ML IV SOLN
INTRAVENOUS | Status: AC
  Filled 2016-04-19: qty 1

## 2016-04-19 MED ORDER — FENTANYL CITRATE (PF) 100 MCG/2ML IJ SOLN: 25.0000 ug | INTRAMUSCULAR | Status: DC | PRN

## 2016-04-19 MED ORDER — DEXAMETHASONE SODIUM PHOSPHATE 10 MG/ML IJ SOLN
INTRAMUSCULAR | Status: DC | PRN
  Administered 2016-04-19: 10 mg via INTRAVENOUS

## 2016-04-19 MED ORDER — EPHEDRINE SULFATE 50 MG/ML IJ SOLN
INTRAMUSCULAR | Status: AC
  Filled 2016-04-19: qty 1

## 2016-04-19 MED ORDER — SODIUM CHLORIDE 0.9% FLUSH: 3.0000 mL | Freq: Two times a day (BID) | INTRAVENOUS | Status: DC

## 2016-04-19 MED ORDER — SODIUM CHLORIDE 0.9% FLUSH: 3.0000 mL | INTRAVENOUS | Status: DC | PRN

## 2016-04-19 MED ORDER — SUGAMMADEX SODIUM 200 MG/2ML IV SOLN
INTRAVENOUS | Status: AC
  Filled 2016-04-19: qty 2

## 2016-04-19 MED ORDER — ACETAMINOPHEN 325 MG PO TABS: 650.0000 mg | ORAL_TABLET | ORAL | Status: DC | PRN

## 2016-04-19 MED ORDER — CIPROFLOXACIN IN D5W 400 MG/200ML IV SOLN
INTRAVENOUS | Status: AC
  Filled 2016-04-19: qty 200

## 2016-04-19 MED ORDER — EPHEDRINE SULFATE 50 MG/ML IJ SOLN
INTRAMUSCULAR | Status: DC | PRN
  Administered 2016-04-19: 15 mg via INTRAVENOUS
  Administered 2016-04-19: 10 mg via INTRAVENOUS
  Administered 2016-04-19: 15 mg via INTRAVENOUS
  Administered 2016-04-19: 10 mg via INTRAVENOUS

## 2016-04-19 MED ORDER — FENTANYL CITRATE (PF) 100 MCG/2ML IJ SOLN
INTRAMUSCULAR | Status: DC | PRN
  Administered 2016-04-19 (×2): 50 ug via INTRAVENOUS

## 2016-04-19 MED ORDER — OXYCODONE HCL 5 MG PO TABS: 5.0000 mg | ORAL_TABLET | ORAL | Status: DC | PRN

## 2016-04-19 MED ORDER — PROPOFOL 10 MG/ML IV BOLUS
INTRAVENOUS | Status: DC | PRN
  Administered 2016-04-19: 150 mg via INTRAVENOUS

## 2016-04-19 MED ORDER — LIDOCAINE HCL (CARDIAC) 20 MG/ML IV SOLN
INTRAVENOUS | Status: DC | PRN
  Administered 2016-04-19: 100 mg via INTRATRACHEAL

## 2016-04-19 SURGICAL SUPPLY — 26 items
BAG URO CATCHER STRL LF (MISCELLANEOUS) ×3 IMPLANT
BASKET LASER NITINOL 1.9FR (BASKET) IMPLANT
BASKET STONE NCOMPASS (UROLOGICAL SUPPLIES) IMPLANT
CATH FOLEY 2WAY SLVR  5CC 20FR (CATHETERS) ×2
CATH FOLEY 2WAY SLVR 5CC 20FR (CATHETERS) ×1 IMPLANT
CATH URET 5FR 28IN OPEN ENDED (CATHETERS) ×3 IMPLANT
CATH URET DUAL LUMEN 6-10FR 50 (CATHETERS) IMPLANT
CLOTH BEACON ORANGE TIMEOUT ST (SAFETY) ×3 IMPLANT
FIBER LASER FLEXIVA 1000 (UROLOGICAL SUPPLIES) IMPLANT
FIBER LASER FLEXIVA 365 (UROLOGICAL SUPPLIES) IMPLANT
FIBER LASER FLEXIVA 550 (UROLOGICAL SUPPLIES) IMPLANT
GLOVE SURG SS PI 8.0 STRL IVOR (GLOVE) ×3 IMPLANT
GOWN STRL REUS W/TWL XL LVL3 (GOWN DISPOSABLE) ×3 IMPLANT
GUIDEWIRE STR DUAL SENSOR (WIRE) ×3 IMPLANT
IV NS 1000ML (IV SOLUTION)
IV NS 1000ML BAXH (IV SOLUTION) IMPLANT
IV NS IRRIG 3000ML ARTHROMATIC (IV SOLUTION) IMPLANT
LOOP CUT BIPOLAR 24F LRG (ELECTROSURGICAL) ×3 IMPLANT
MANIFOLD NEPTUNE II (INSTRUMENTS) ×3 IMPLANT
NS IRRIG 1000ML POUR BTL (IV SOLUTION) ×3 IMPLANT
PACK CYSTO (CUSTOM PROCEDURE TRAY) ×3 IMPLANT
SHEATH ACCESS URETERAL 38CM (SHEATH) IMPLANT
SHEATH URET ACCESS 10/12FR (MISCELLANEOUS) IMPLANT
TUBING CONNECTING 10 (TUBING) ×2 IMPLANT
TUBING CONNECTING 10' (TUBING) ×1
WATER STERILE IRR 500ML POUR (IV SOLUTION) ×3 IMPLANT

## 2016-04-19 NOTE — Anesthesia Postprocedure Evaluation (Signed)
Anesthesia Post Note  Patient: Joshua [name]  Procedure(s) Performed: Procedure(s) (LRB): CYSTOSCOPY WITH BILATERAL RETROGRADE PYELOGRAM TRANSURETHRAL RESECTION OF BLADDER TUMOR  (Bilateral)  Patient location during evaluation: PACU Anesthesia Type: General Level of consciousness: awake and alert Pain management: pain level controlled Vital Signs Assessment: post-procedure vital signs reviewed and stable Respiratory status: spontaneous breathing, nonlabored ventilation, respiratory function stable and patient connected to nasal cannula oxygen Cardiovascular status: blood pressure returned to baseline and stable Postop Assessment: no signs of nausea or vomiting Anesthetic complications: no    Last Vitals:  Vitals:   04/19/16 1119 04/19/16 1221  BP: 130/60 (!) 129/59  Pulse: (!) 54 (!) 55  Resp: 15 16  Temp: 36.9 C 36.4 C    Last Pain:  Vitals:   04/19/16 1221  TempSrc: Oral  PainSc:                  Amiah Frohlich DAVID

## 2016-04-19 NOTE — Interval H&P Note (Signed)
History and Physical Interval Note:  04/19/2016 8:30 AM  Joshua [name]  has presented today for surgery, with the diagnosis of LEFT BLADDER NECK TUMOR  The various methods of treatment have been discussed with the patient and family. After consideration of risks, benefits and other options for treatment, the patient has consented to  Procedure(s) with comments: [place]  (Bilateral) - 1 HOUR as a surgical intervention .  The patient's history has been reviewed, patient examined, no change in status, stable for surgery.  I have reviewed the patient's chart and labs.  Questions were answered to the patient's satisfaction.     Lilah Mijangos J

## 2016-04-19 NOTE — Transfer of Care (Signed)
Immediate Anesthesia Transfer of Care Note  Patient: Joshua [name]  Procedure(s) Performed: Procedure(s) with comments: CYSTOSCOPY WITH BILATERAL RETROGRADE PYELOGRAM TRANSURETHRAL RESECTION OF BLADDER TUMOR  (Bilateral) - 1 HOUR  Patient Location: PACU  Anesthesia Type:General  Level of Consciousness: awake, alert  and patient cooperative  Airway & Oxygen Therapy: Patient Spontanous Breathing and Patient connected to face mask oxygen  Post-op Assessment: Report given to RN and Post -op Vital signs reviewed and stable  Post vital signs: Reviewed and stable  Last Vitals:  Vitals:   04/19/16 0702  BP: (!) 146/67  Pulse: 69  Resp: 18  Temp: 36.4 C    Last Pain:  Vitals:   04/19/16 0702  TempSrc: Oral      Patients Stated Pain Goal: 2 ([id number])  Complications: No apparent anesthesia complications

## 2016-04-19 NOTE — Op Note (Signed)
NAMESTELLAN, Joshua                ACCOUNT NO.:  [id number]  MEDICAL RECORD NO.:  [id number]  LOCATION:  [facility]                         FACILITY:  [facility]  PHYSICIAN:  Marshall Cork. Jeffie Pollock, M.D.    DATE OF BIRTH:  10/16/1931  DATE OF PROCEDURE:  04/19/2016 DATE OF DISCHARGE:  04/19/2016                              OPERATIVE REPORT   PATIENT OF:  Marshall Cork. Jeffie Pollock, M.D.  PROCEDURE: 1. Cystoscopy with bilateral retrograde pyelograms and interpretation. 2. Transurethral resection of large bladder tumor. 3. Instillation of epirubicin in the PACU.  PREOPERATIVE DIAGNOSIS:  Left lateral wall bladder tumor.  POSTOPERATIVE DIAGNOSIS:  Large left lateral wall bladder tumor.  SURGEON:  Marshall Cork. Jeffie Pollock, M.D.  ANESTHESIA:  General.  SPECIMEN:  Bladder tumor chips.  DRAINS:  A 20-French Foley catheter.  BLOOD LOSS:  Minimal.  COMPLICATIONS:  None.  INDICATIONS:  Mr. Adair is an 82 year old white male who was evaluated recently for gross hematuria and was found to have a left lateral wall bladder tumor.  He presents today for resection of the tumor and bilateral retrograde pyelograms.  FINDINGS OF PROCEDURE:  The patient was taken to the operating room where he was given Cipro.  A general anesthetic was induced; and because of the location of the tumor, a paralytic agent was used as well.  The patient was placed in a lithotomy position.  His perineum and genitalia were prepped with Betadine solution.  He was draped in usual sterile fashion after being fitted with PAS hose.  Cystoscopy was performed using a 23-French scope 30 degree lens. Examination revealed a normal urethra.  The external sphincter was intact.  The prostatic urethra was approximately 2 cm in length with mild bilobar hyperplasia without significant obstruction.  There were radiation changes with neovascularity implants in the prostatic urethra.  Inspection of bladder revealed ureteral orifices in their normal anatomic  position.  He had mild trabeculation.  No stones were seen; but on the left lateral wall, there was a multifocal bladder tumor with intervening mucosal changes worrisome for carcinoma in situ that extended for approximately 4 x 6 cm in size.  Once initial inspection was performed, bilateral retrograde pyelograms were performed using an open-end catheter and Cystografin.  The right retrograde pyelogram revealed a normal ureter and intrarenal collecting system.  Left retrograde pyelogram revealed a normal ureter and intrarenal collecting system.  The cystoscope was then removed and was replaced with a 28-French continuous flow resectoscope sheath that was passed for the aid of the visual obturator.  The scope was then fitted with an Beatrix Fetters handle with a bipolar loop and 30-degree lens with saline used as the irrigant.  The tumor was then resected with care being taken to test with the obturator reflex.  There was none.  Resection was carried down into the muscle.  Once the papillary lesions had been resected, the intervening abnormal mucosa that was consistent with carcinoma in situ was widely fulgurated.  The final contiguous defect was approximately 4 x 6 cm.  Once the resection was complete and hemostasis was achieved, the bladder was evacuated free of the tumor chips.  Final inspection revealed no active bleeding.  The  ureteral orifices were intact, and there was no perforation of the bladder wall.  At this point, a 20-French Foley catheter was inserted.  The balloon was filled with 10 mL sterile fluid.  The catheter was irrigated with clear return.  The patient's anesthetic was reversed.  He was moved to the recovery room in stable condition.  In recovery room, his bladder was instilled with 40 mg of epirubicin and 40 mL of diluent.  This was left indwelling for one hour before the bladder was drained, and the patient was discharged home with a Foley.  There were no  complications.     Marshall Cork. Jeffie Pollock, M.D.     JJW/MEDQ  D:  04/19/2016  T:  04/19/2016  Job:  [id number]

## 2016-04-19 NOTE — Anesthesia Preprocedure Evaluation (Signed)
Anesthesia Evaluation  Patient identified by MRN, date of birth, ID band Patient awake    Reviewed: Allergy & Precautions, NPO status , Patient's Chart, lab work & pertinent test results  Airway Mallampati: I  TM Distance: >3 FB Neck [name]: Full    Dental   Pulmonary former smoker,    Pulmonary exam normal       Cardiovascular hypertension, Pt. on medications Normal cardiovascular exam    Neuro/Psych    GI/Hepatic GERD-  Medicated and Controlled,  Endo/Other    Renal/GU      Musculoskeletal   Abdominal   Peds  Hematology   Anesthesia Other Findings   Reproductive/Obstetrics                             Anesthesia Physical Anesthesia Plan  ASA: II  Anesthesia Plan: General   Post-op Pain Management:    Induction: Intravenous  Airway Management Planned: LMA  Additional Equipment:   Intra-op Plan:   Post-operative Plan: Extubation in OR  Informed Consent: I have reviewed the patients History and Physical, chart, labs and discussed the procedure including the risks, benefits and alternatives for the proposed anesthesia with the patient or authorized representative who has indicated his/her understanding and acceptance.     Plan Discussed with: CRNA and Surgeon  Anesthesia Plan Comments:         Anesthesia Quick Evaluation  

## 2016-04-19 NOTE — Brief Op Note (Signed)
04/19/2016  9:41 AM  PATIENT:  Joshua [name]  82 y.o. male  PRE-OPERATIVE DIAGNOSIS:  LEFT BLADDER NECK TUMOR  POST-OPERATIVE DIAGNOSIS:  LEFT BLADDER NECK TUMOR  PROCEDURE:  Procedure(s) with comments: CYSTOSCOPY WITH BILATERAL RETROGRADE PYELOGRAM TRANSURETHRAL RESECTION OF BLADDER TUMOR  (Bilateral) - 1 HOUR >5cm INSTILLATION OF EPIRUBICIN (in PACU) SURGEON:  Surgeon(s) and Role:    * [name], MD - Primary  PHYSICIAN ASSISTANT:   ASSISTANTS: none   ANESTHESIA:   general  EBL:  Total I/O In: 1000 [I.V.:1000] Out: 0   BLOOD ADMINISTERED:none  DRAINS: Urinary Catheter (Foley)   LOCAL MEDICATIONS USED:  NONE  SPECIMEN:  Source of Specimen:  left lateral wall bladder tumor.   DISPOSITION OF SPECIMEN:  PATHOLOGY  COUNTS:  YES  TOURNIQUET:  * No tourniquets in log *  DICTATION: .Other Dictation: Dictation Number [id number]  PLAN OF CARE: Discharge to home after PACU  PATIENT DISPOSITION:  PACU - hemodynamically stable.   Delay start of Pharmacological VTE agent (>24hrs) due to surgical blood loss or risk of bleeding: yes

## 2016-04-19 NOTE — Anesthesia Procedure Notes (Signed)
Procedure Name: Intubation Date/Time: 04/19/2016 9:07 AM Performed by: Dione Booze Pre-anesthesia Checklist: Patient identified, Emergency Drugs available, Suction available and Patient being monitored Patient Re-evaluated:Patient Re-evaluated prior to inductionOxygen Delivery Method: Circle system utilized Preoxygenation: Pre-oxygenation with 100% oxygen Intubation Type: IV induction Ventilation: Mask ventilation without difficulty Laryngoscope Size: Mac and 4 Grade View: Grade II Tube type: Oral Tube size: 7.5 mm Number of attempts: 1 Airway Equipment and Method: Stylet Placement Confirmation: ETT inserted through vocal cords under direct vision,  positive ETCO2 and breath sounds checked- equal and bilateral Secured at: 23 cm Tube secured with: Tape Dental Injury: Teeth and Oropharynx as per pre-operative assessment  Comments: Intubated by K.Dell Ponto

## 2016-04-19 NOTE — Discharge Instructions (Addendum)
CYSTOSCOPY HOME CARE INSTRUCTIONS  Activity: Rest for the remainder of the day.  Do not drive or operate equipment today.  You may resume normal activities in one to two days as instructed by your physician.   Meals: Drink plenty of liquids and eat light foods such as gelatin or soup this evening.  You may return to a normal meal plan tomorrow.  Return to Work: You may return to work in one to two days or as instructed by your physician.  Special Instructions / Symptoms: Call your physician if any of these symptoms occur:   -persistent or heavy bleeding  -bleeding which continues after first few urination  -large blood clots that are difficult to pass  -urine stream diminishes or stops completely  -fever equal to or higher than 101 degrees Farenheit.  -cloudy urine with a strong, foul odor  -severe pain  Females should always wipe from front to back after elimination.  You may feel some burning pain when you urinate.  This should disappear with time.  Applying moist heat to the lower abdomen or a hot tub bath may help relieve the pain. \  You may removed the catheter in the morning by cutting the side arm on the catheter and it should just slide out.  If you are not comfortable doing that, you may come to the office in the morning to have it done.      Patient Signature:  ________________________________________________________  Nurse's Signature:  ________________________________________________________   Foley Catheter Care, Adult A Foley catheter is a soft, flexible tube that is placed into the bladder to drain urine. A Foley catheter may be inserted if:  You leak urine or are not able to control when you urinate (urinary incontinence).  You are not able to urinate when you need to (urinary retention).  You had prostate surgery or surgery on the genitals.  You have certain medical conditions, such as multiple sclerosis, dementia, or a spinal cord injury. If you are  going home with a Foley catheter in place, follow the instructions below. TAKING CARE OF THE CATHETER 1. Wash your hands with soap and water. 2. Using mild soap and warm water on a clean washcloth:  Clean the area on your body closest to the catheter insertion site using a circular motion, moving away from the catheter. Never wipe toward the catheter because this could sweep bacteria up into the urethra and cause infection.  Remove all traces of soap. Pat the area dry with a clean towel. For males, reposition the foreskin. 3. Attach the catheter to your leg so there is no tension on the catheter. Use adhesive tape or a leg strap. If you are using adhesive tape, remove any sticky residue left behind by the previous tape you used. 4. Keep the drainage bag below the level of the bladder, but keep it off the floor. 5. Check throughout the day to be sure the catheter is working and urine is draining freely. Make sure the tubing does not become kinked. 6. Do not pull on the catheter or try to remove it. Pulling could damage internal tissues. TAKING CARE OF THE DRAINAGE BAGS You will be given two drainage bags to take home. One is a large overnight drainage bag, and the other is a smaller leg bag that fits underneath clothing. You may wear the overnight bag at any time, but you should never wear the smaller leg bag at night. Follow the instructions below for how to empty, change, and  clean your drainage bags. Emptying the Drainage Bag You must empty your drainage bag when it is  - full or at least 2-3 times a day. 1. Wash your hands with soap and water. 2. Keep the drainage bag below your hips, below the level of your bladder. This stops urine from going back into the tubing and into your bladder. 3. Hold the dirty bag over the toilet or a clean container. 4. Open the pour spout at the bottom of the bag and empty the urine into the toilet or container. Do not let the pour spout touch the toilet,  container, or any other surface. Doing so can place bacteria on the bag, which can cause an infection. 5. Clean the pour spout with a gauze pad or cotton ball that has rubbing alcohol on it. 6. Close the pour spout. 7. Attach the bag to your leg with adhesive tape or a leg strap. 8. Wash your hands well. Changing the Drainage Bag Change your drainage bag once a month or sooner if it starts to smell bad or look dirty. Below are steps to follow when changing the drainage bag. 1. Wash your hands with soap and water. 2. Pinch off the rubber catheter so that urine does not spill out. 3. Disconnect the catheter tube from the drainage tube at the connection valve. Do not let the tubes touch any surface. 4. Clean the end of the catheter tube with an alcohol wipe. Use a different alcohol wipe to clean the end of the drainage tube. 5. Connect the catheter tube to the drainage tube of the clean drainage bag. 6. Attach the new bag to the leg with adhesive tape or a leg strap. Avoid attaching the new bag too tightly. 7. Wash your hands well. Cleaning the Drainage Bag 1. Wash your hands with soap and water. 2. Wash the bag in warm, soapy water. 3. Rinse the bag thoroughly with warm water. 4. Fill the bag with a solution of white vinegar and water (1 cup vinegar to 1 qt warm water [.2 L vinegar to 1 L warm water]). Close the bag and soak it for 30 minutes in the solution. 5. Rinse the bag with warm water. 6. Hang the bag to dry with the pour spout open and hanging downward. 7. Store the clean bag (once it is dry) in a clean plastic bag. 8. Wash your hands well. PREVENTING INFECTION  Wash your hands before and after handling your catheter.  Take showers daily and wash the area where the catheter enters your body. Do not take baths. Replace wet leg straps with dry ones, if this applies.  Do not use powders, sprays, or lotions on the genital area. Only use creams, lotions, or ointments as directed by your  caregiver.  For females, wipe from front to back after each bowel movement.  Drink enough fluids to keep your urine clear or pale yellow unless you have a fluid restriction.  Do not let the drainage bag or tubing touch or lie on the floor.  Wear cotton underwear to absorb moisture and to keep your skin drier. SEEK MEDICAL CARE IF:   Your urine is cloudy or smells unusually bad.  Your catheter becomes clogged.  You are not draining urine into the bag or your bladder feels full.  Your catheter starts to leak. SEEK IMMEDIATE MEDICAL CARE IF:   You have pain, swelling, redness, or pus where the catheter enters the body.  You have pain in the abdomen,  legs, lower back, or bladder.  You have a fever.  You see blood fill the catheter, or your urine is pink or red.  You have nausea, vomiting, or chills.  Your catheter gets pulled out. MAKE SURE YOU:   Understand these instructions.  Will watch your condition.  Will get help right away if you are not doing well or get worse.   This information is not intended to replace advice given to you by your health care provider. Make sure you discuss any questions you have with your health care provider.   Document Released: 09/05/2005 Document Revised: 01/20/2014 Document Reviewed: 08/27/2012 Elsevier Interactive Patient Education [employer].

## 2016-04-26 ENCOUNTER — Other Ambulatory Visit: Payer: Self-pay | Admitting: Family Medicine

## 2016-04-26 MED ORDER — PRAVASTATIN SODIUM 80 MG PO TABS: 80.0000 mg | ORAL_TABLET | Freq: Every day | ORAL | 0 refills | Status: DC

## 2016-04-27 ENCOUNTER — Inpatient Hospital Stay (HOSPITAL_COMMUNITY)
Admission: EM | Admit: 2016-04-27 | Discharge: 2016-05-10 | DRG: 246 | Disposition: A | Discharge status: Skilled Nursing Facility | Payer: Medicare Other | Source: Intra-hospital | Attending: Interventional Cardiology | Admitting: Interventional Cardiology

## 2016-04-27 ENCOUNTER — Encounter (HOSPITAL_COMMUNITY)
Admission: EM | Disposition: A | Discharge status: Skilled Nursing Facility | Payer: Self-pay | Attending: Interventional Cardiology

## 2016-04-27 ENCOUNTER — Encounter (HOSPITAL_COMMUNITY): Payer: Self-pay | Admitting: Interventional Cardiology

## 2016-04-27 DIAGNOSIS — K219 Gastro-esophageal reflux disease without esophagitis: Secondary | ICD-10-CM | POA: Diagnosis present

## 2016-04-27 DIAGNOSIS — B999 Unspecified infectious disease: Secondary | ICD-10-CM

## 2016-04-27 DIAGNOSIS — R310 Gross hematuria: Secondary | ICD-10-CM | POA: Diagnosis not present

## 2016-04-27 DIAGNOSIS — R404 Transient alteration of awareness: Secondary | ICD-10-CM | POA: Diagnosis not present

## 2016-04-27 DIAGNOSIS — R531 Weakness: Secondary | ICD-10-CM | POA: Diagnosis not present

## 2016-04-27 DIAGNOSIS — Z87891 Personal history of nicotine dependence: Secondary | ICD-10-CM | POA: Diagnosis not present

## 2016-04-27 DIAGNOSIS — I25110 Atherosclerotic heart disease of native coronary artery with unstable angina pectoris: Secondary | ICD-10-CM | POA: Diagnosis not present

## 2016-04-27 DIAGNOSIS — K5900 Constipation, unspecified: Secondary | ICD-10-CM | POA: Diagnosis not present

## 2016-04-27 DIAGNOSIS — E785 Hyperlipidemia, unspecified: Secondary | ICD-10-CM | POA: Diagnosis not present

## 2016-04-27 DIAGNOSIS — Z9181 History of falling: Secondary | ICD-10-CM | POA: Diagnosis not present

## 2016-04-27 DIAGNOSIS — I2119 ST elevation (STEMI) myocardial infarction involving other coronary artery of inferior wall: Principal | ICD-10-CM

## 2016-04-27 DIAGNOSIS — I5043 Acute on chronic combined systolic (congestive) and diastolic (congestive) heart failure: Secondary | ICD-10-CM | POA: Diagnosis not present

## 2016-04-27 DIAGNOSIS — R1313 Dysphagia, pharyngeal phase: Secondary | ICD-10-CM | POA: Diagnosis present

## 2016-04-27 DIAGNOSIS — M6281 Muscle weakness (generalized): Secondary | ICD-10-CM | POA: Diagnosis not present

## 2016-04-27 DIAGNOSIS — R569 Unspecified convulsions: Secondary | ICD-10-CM | POA: Diagnosis not present

## 2016-04-27 DIAGNOSIS — R55 Syncope and collapse: Secondary | ICD-10-CM | POA: Diagnosis not present

## 2016-04-27 DIAGNOSIS — Z955 Presence of coronary angioplasty implant and graft: Secondary | ICD-10-CM | POA: Diagnosis not present

## 2016-04-27 DIAGNOSIS — R079 Chest pain, unspecified: Secondary | ICD-10-CM | POA: Diagnosis not present

## 2016-04-27 DIAGNOSIS — R509 Fever, unspecified: Secondary | ICD-10-CM

## 2016-04-27 DIAGNOSIS — I441 Atrioventricular block, second degree: Secondary | ICD-10-CM | POA: Diagnosis present

## 2016-04-27 DIAGNOSIS — R001 Bradycardia, unspecified: Secondary | ICD-10-CM | POA: Diagnosis not present

## 2016-04-27 DIAGNOSIS — I252 Old myocardial infarction: Secondary | ICD-10-CM

## 2016-04-27 DIAGNOSIS — G40909 Epilepsy, unspecified, not intractable, without status epilepticus: Secondary | ICD-10-CM | POA: Diagnosis present

## 2016-04-27 DIAGNOSIS — D72829 Elevated white blood cell count, unspecified: Secondary | ICD-10-CM | POA: Diagnosis present

## 2016-04-27 DIAGNOSIS — Z79899 Other long term (current) drug therapy: Secondary | ICD-10-CM

## 2016-04-27 DIAGNOSIS — I110 Hypertensive heart disease with heart failure: Secondary | ICD-10-CM | POA: Diagnosis present

## 2016-04-27 DIAGNOSIS — R278 Other lack of coordination: Secondary | ICD-10-CM | POA: Diagnosis not present

## 2016-04-27 DIAGNOSIS — R918 Other nonspecific abnormal finding of lung field: Secondary | ICD-10-CM | POA: Diagnosis not present

## 2016-04-27 DIAGNOSIS — I2111 ST elevation (STEMI) myocardial infarction involving right coronary artery: Secondary | ICD-10-CM

## 2016-04-27 DIAGNOSIS — I2510 Atherosclerotic heart disease of native coronary artery without angina pectoris: Secondary | ICD-10-CM | POA: Diagnosis present

## 2016-04-27 DIAGNOSIS — I440 Atrioventricular block, first degree: Secondary | ICD-10-CM | POA: Diagnosis not present

## 2016-04-27 DIAGNOSIS — I213 ST elevation (STEMI) myocardial infarction of unspecified site: Secondary | ICD-10-CM | POA: Diagnosis present

## 2016-04-27 DIAGNOSIS — Z8551 Personal history of malignant neoplasm of bladder: Secondary | ICD-10-CM | POA: Diagnosis not present

## 2016-04-27 DIAGNOSIS — E7800 Pure hypercholesterolemia, unspecified: Secondary | ICD-10-CM | POA: Diagnosis not present

## 2016-04-27 DIAGNOSIS — Z7982 Long term (current) use of aspirin: Secondary | ICD-10-CM | POA: Diagnosis not present

## 2016-04-27 DIAGNOSIS — I499 Cardiac arrhythmia, unspecified: Secondary | ICD-10-CM | POA: Diagnosis not present

## 2016-04-27 DIAGNOSIS — R570 Cardiogenic shock: Secondary | ICD-10-CM | POA: Diagnosis present

## 2016-04-27 DIAGNOSIS — I10 Essential (primary) hypertension: Secondary | ICD-10-CM | POA: Diagnosis present

## 2016-04-27 DIAGNOSIS — Z8546 Personal history of malignant neoplasm of prostate: Secondary | ICD-10-CM | POA: Diagnosis not present

## 2016-04-27 DIAGNOSIS — E559 Vitamin D deficiency, unspecified: Secondary | ICD-10-CM | POA: Diagnosis not present

## 2016-04-27 DIAGNOSIS — R52 Pain, unspecified: Secondary | ICD-10-CM | POA: Diagnosis not present

## 2016-04-27 HISTORY — DX: Atherosclerotic heart disease of native coronary artery without angina pectoris: I25.10

## 2016-04-27 HISTORY — PX: CARDIAC CATHETERIZATION: SHX172

## 2016-04-27 LAB — CBC WITH DIFFERENTIAL/PLATELET
BASOS ABS: 0 10*3/uL (ref 0.0–0.1)
BASOS PCT: 0 %
BASOS PCT: 0 %
Basophils Absolute: 0 10*3/uL (ref 0.0–0.1)
EOS PCT: 0 %
EOS PCT: 1 %
Eosinophils Absolute: 0 10*3/uL (ref 0.0–0.7)
Eosinophils Absolute: 0 10*3/uL (ref 0.0–0.7)
HCT: 37.3 % — ABNORMAL LOW (ref 39.0–52.0)
HEMATOCRIT: 36.6 % — AB (ref 39.0–52.0)
Hemoglobin: 12.4 g/dL — ABNORMAL LOW (ref 13.0–17.0)
Hemoglobin: 12.5 g/dL — ABNORMAL LOW (ref 13.0–17.0)
LYMPHS ABS: 1.1 10*3/uL (ref 0.7–4.0)
LYMPHS PCT: 9 %
LYMPHS PCT: 14 %
Lymphs Abs: 0.5 10*3/uL — ABNORMAL LOW (ref 0.7–4.0)
MCH: 30 pg (ref 26.0–34.0)
MCH: 30.5 pg (ref 26.0–34.0)
MCHC: 33.2 g/dL (ref 30.0–36.0)
MCHC: 34.2 g/dL (ref 30.0–36.0)
MCV: 90.3 fL (ref 78.0–100.0)
MCV: 89.3 fL (ref 78.0–100.0)
MONOS PCT: 9 %
Monocytes Absolute: 0.4 10*3/uL (ref 0.1–1.0)
Monocytes Absolute: 0.9 10*3/uL (ref 0.1–1.0)
Monocytes Relative: 3 %
NEUTROS PCT: 88 %
NEUTROS PCT: 76 %
Neutro Abs: 8.8 10*3/uL — ABNORMAL HIGH (ref 1.7–7.7)
Neutro Abs: 7 10*3/uL (ref 1.7–7.7)
PLATELETS: 275 10*3/uL (ref 150–400)
PLATELETS: 232 10*3/uL (ref 150–400)
RBC: 4.13 MIL/uL — ABNORMAL LOW (ref 4.22–5.81)
RBC: 4.1 MIL/uL — ABNORMAL LOW (ref 4.22–5.81)
RDW: 14.1 % (ref 11.5–15.5)
RDW: 14.2 % (ref 11.5–15.5)
WBC: 9.7 10*3/uL (ref 4.0–10.5)
WBC: 9 10*3/uL (ref 4.0–10.5)

## 2016-04-27 LAB — COMPREHENSIVE METABOLIC PANEL
ALBUMIN: 3.2 g/dL — AB (ref 3.5–5.0)
ALK PHOS: 123 U/L (ref 38–126)
ALT: 29 U/L (ref 17–63)
AST: 73 U/L — AB (ref 15–41)
Anion gap: 7 (ref 5–15)
BUN: 16 mg/dL (ref 6–20)
CALCIUM: 8.6 mg/dL — AB (ref 8.9–10.3)
CHLORIDE: 113 mmol/L — AB (ref 101–111)
CO2: 19 mmol/L — AB (ref 22–32)
CREATININE: 1.02 mg/dL (ref 0.61–1.24)
GFR calc Af Amer: >60 mL/min (ref >60)
GFR calc non Af Amer: >60 mL/min (ref >60)
GLUCOSE: 186 mg/dL — AB (ref 65–99)
Potassium: 3.8 mmol/L (ref 3.5–5.1)
SODIUM: 139 mmol/L (ref 135–145)
Total Bilirubin: 0.5 mg/dL (ref 0.3–1.2)
Total Protein: 5.9 g/dL — ABNORMAL LOW (ref 6.5–8.1)

## 2016-04-27 LAB — CBC
HEMATOCRIT: 36.8 % — AB (ref 39.0–52.0)
HEMOGLOBIN: 12.1 g/dL — AB (ref 13.0–17.0)
MCH: 29.5 pg (ref 26.0–34.0)
MCHC: 32.9 g/dL (ref 30.0–36.0)
MCV: 89.8 fL (ref 78.0–100.0)
Platelets: 233 10*3/uL (ref 150–400)
RBC: 4.1 MIL/uL — ABNORMAL LOW (ref 4.22–5.81)
RDW: 13.9 % (ref 11.5–15.5)
WBC: 11.3 10*3/uL — AB (ref 4.0–10.5)

## 2016-04-27 LAB — DIFFERENTIAL
Basophils Absolute: 0 10*3/uL (ref 0.0–0.1)
Basophils Relative: 0 %
Eosinophils Absolute: 0 10*3/uL (ref 0.0–0.7)
Eosinophils Relative: 0 %
LYMPHS ABS: 1.1 10*3/uL (ref 0.7–4.0)
LYMPHS PCT: 10 %
MONO ABS: 0.8 10*3/uL (ref 0.1–1.0)
MONOS PCT: 7 %
NEUTROS ABS: 9.3 10*3/uL — AB (ref 1.7–7.7)
Neutrophils Relative %: 83 %

## 2016-04-27 LAB — CREATININE, SERUM: CREATININE: 1.07 mg/dL (ref 0.61–1.24)

## 2016-04-27 LAB — LIPID PANEL
CHOL/HDL RATIO: 3.3 ratio
Cholesterol: 144 mg/dL (ref 0–200)
HDL: 43 mg/dL (ref >40)
LDL CALC: 85 mg/dL (ref 0–99)
Triglycerides: 80 mg/dL (ref <150)
VLDL: 16 mg/dL (ref 0–40)

## 2016-04-27 LAB — TROPONIN I
TROPONIN I: 8.48 ng/mL — AB (ref <0.03)
TROPONIN I: 10.87 ng/mL — AB (ref <0.03)
Troponin I: 6.85 ng/mL (ref <0.03)

## 2016-04-27 LAB — APTT: APTT: 30 s (ref 24–36)

## 2016-04-27 LAB — PROTIME-INR
INR: 1.15
Prothrombin Time: 14.7 seconds (ref 11.4–15.2)

## 2016-04-27 LAB — POCT ACTIVATED CLOTTING TIME
Activated Clotting Time: 197 seconds
Activated Clotting Time: 307 seconds

## 2016-04-27 SURGERY — LEFT HEART CATH AND CORONARY ANGIOGRAPHY

## 2016-04-27 MED ORDER — TICAGRELOR 90 MG PO TABS
ORAL_TABLET | ORAL | Status: AC
  Filled 2016-04-27: qty 1

## 2016-04-27 MED ORDER — HEPARIN (PORCINE) IN NACL 2-0.9 UNIT/ML-% IJ SOLN
INTRAMUSCULAR | Status: DC | PRN
  Administered 2016-04-27: 1000 mL

## 2016-04-27 MED ORDER — IOPAMIDOL (ISOVUE-370) INJECTION 76%
INTRAVENOUS | Status: AC
  Filled 2016-04-27: qty 50

## 2016-04-27 MED ORDER — ONDANSETRON HCL 4 MG/2ML IJ SOLN: 4.0000 mg | Freq: Four times a day (QID) | INTRAMUSCULAR | Status: DC | PRN

## 2016-04-27 MED ORDER — ASPIRIN 81 MG PO CHEW
CHEWABLE_TABLET | ORAL | Status: DC | PRN
  Administered 2016-04-27: 324 mg via ORAL

## 2016-04-27 MED ORDER — HEPARIN SODIUM (PORCINE) 1000 UNIT/ML IJ SOLN
INTRAMUSCULAR | Status: AC
  Filled 2016-04-27: qty 1

## 2016-04-27 MED ORDER — SODIUM CHLORIDE 0.9 % IV SOLN
INTRAVENOUS | Status: DC | PRN
  Administered 2016-04-27: 10 mL/h via INTRAVENOUS

## 2016-04-27 MED ORDER — PHENOBARBITAL 32.4 MG PO TABS
97.2000 mg | ORAL_TABLET | Freq: Every evening | ORAL | Status: DC
  Administered 2016-04-27 – 2016-05-09 (×13): 97.2 mg via ORAL
  Filled 2016-04-27 (×14): qty 3

## 2016-04-27 MED ORDER — IOPAMIDOL (ISOVUE-370) INJECTION 76%
INTRAVENOUS | Status: AC
  Filled 2016-04-27: qty 125

## 2016-04-27 MED ORDER — HEPARIN SODIUM (PORCINE) 5000 UNIT/ML IJ SOLN: 5000.0000 [IU] | Freq: Three times a day (TID) | INTRAMUSCULAR | Status: DC

## 2016-04-27 MED ORDER — OXYCODONE-ACETAMINOPHEN 5-325 MG PO TABS: 1.0000 | ORAL_TABLET | ORAL | Status: DC | PRN

## 2016-04-27 MED ORDER — SODIUM CHLORIDE 0.9 % IV SOLN
INTRAVENOUS | Status: DC | PRN
  Administered 2016-04-27: 999 mL/h via INTRAVENOUS

## 2016-04-27 MED ORDER — FENTANYL CITRATE (PF) 100 MCG/2ML IJ SOLN
INTRAMUSCULAR | Status: DC | PRN
  Administered 2016-04-27: 50 ug via INTRAVENOUS

## 2016-04-27 MED ORDER — IOPAMIDOL (ISOVUE-370) INJECTION 76%
INTRAVENOUS | Status: DC | PRN
  Administered 2016-04-27: 260 mL via INTRA_ARTERIAL

## 2016-04-27 MED ORDER — HEPARIN SODIUM (PORCINE) 5000 UNIT/ML IJ SOLN
5000.0000 [IU] | Freq: Three times a day (TID) | INTRAMUSCULAR | Status: DC
  Administered 2016-04-28 – 2016-05-10 (×36): 5000 [IU] via SUBCUTANEOUS
  Filled 2016-04-27 (×34): qty 1

## 2016-04-27 MED ORDER — PHENOBARBITAL 32.4 MG PO TABS
64.8000 mg | ORAL_TABLET | Freq: Every morning | ORAL | Status: DC
  Administered 2016-04-28 – 2016-05-10 (×13): 64.8 mg via ORAL
  Filled 2016-04-27 (×4): qty 2
  Filled 2016-04-27: qty 1
  Filled 2016-04-27 (×8): qty 2

## 2016-04-27 MED ORDER — NITROGLYCERIN 1 MG/10 ML FOR IR/CATH LAB
INTRA_ARTERIAL | Status: DC | PRN
  Administered 2016-04-27: 200 ug via INTRACORONARY

## 2016-04-27 MED ORDER — CLOPIDOGREL BISULFATE 300 MG PO TABS
300.0000 mg | ORAL_TABLET | Freq: Once | ORAL | Status: AC
  Administered 2016-04-27: 300 mg via ORAL
  Filled 2016-04-27: qty 1

## 2016-04-27 MED ORDER — ASPIRIN 81 MG PO CHEW
CHEWABLE_TABLET | ORAL | Status: AC
  Filled 2016-04-27: qty 1

## 2016-04-27 MED ORDER — TIROFIBAN HCL IN NACL 5-0.9 MG/100ML-% IV SOLN
0.1500 ug/kg/min | INTRAVENOUS | Status: AC
  Administered 2016-04-27 (×2): 0.15 ug/kg/min via INTRAVENOUS
  Filled 2016-04-27 (×2): qty 100

## 2016-04-27 MED ORDER — SODIUM CHLORIDE 0.9% FLUSH: 3.0000 mL | INTRAVENOUS | Status: DC | PRN

## 2016-04-27 MED ORDER — FENTANYL CITRATE (PF) 100 MCG/2ML IJ SOLN
INTRAMUSCULAR | Status: AC
  Filled 2016-04-27: qty 2

## 2016-04-27 MED ORDER — SODIUM CHLORIDE 0.9 % IV SOLN: 250.0000 mL | INTRAVENOUS | Status: DC | PRN

## 2016-04-27 MED ORDER — TICAGRELOR 90 MG PO TABS
90.0000 mg | ORAL_TABLET | Freq: Two times a day (BID) | ORAL | Status: DC
Start: 2016-04-28 — End: 2016-04-27

## 2016-04-27 MED ORDER — ATORVASTATIN CALCIUM 80 MG PO TABS
80.0000 mg | ORAL_TABLET | Freq: Every day | ORAL | Status: DC
Start: 2016-04-27 — End: 2016-05-10
  Administered 2016-04-27 – 2016-05-09 (×13): 80 mg via ORAL
  Filled 2016-04-27 (×3): qty 1
  Filled 2016-04-27: qty 2
  Filled 2016-04-27 (×7): qty 1
  Filled 2016-04-27: qty 2
  Filled 2016-04-27 (×2): qty 1

## 2016-04-27 MED ORDER — TIROFIBAN HCL IN NACL 5-0.9 MG/100ML-% IV SOLN
INTRAVENOUS | Status: DC | PRN
  Administered 2016-04-27: 0.15 ug/kg/min via INTRAVENOUS

## 2016-04-27 MED ORDER — CLOPIDOGREL BISULFATE 75 MG PO TABS
75.0000 mg | ORAL_TABLET | Freq: Every day | ORAL | Status: DC
  Administered 2016-04-28 – 2016-05-10 (×13): 75 mg via ORAL
  Filled 2016-04-27 (×13): qty 1

## 2016-04-27 MED ORDER — LIDOCAINE HCL (PF) 1 % IJ SOLN
INTRAMUSCULAR | Status: DC | PRN
  Administered 2016-04-27: 25 mL

## 2016-04-27 MED ORDER — LIDOCAINE HCL (PF) 1 % IJ SOLN
INTRAMUSCULAR | Status: AC
  Filled 2016-04-27: qty 30

## 2016-04-27 MED ORDER — TIROFIBAN HCL IN NACL 5-0.9 MG/100ML-% IV SOLN
INTRAVENOUS | Status: AC
  Filled 2016-04-27: qty 100

## 2016-04-27 MED ORDER — SODIUM CHLORIDE 0.9% FLUSH
3.0000 mL | Freq: Two times a day (BID) | INTRAVENOUS | Status: DC
  Administered 2016-04-27 – 2016-05-10 (×23): 3 mL via INTRAVENOUS

## 2016-04-27 MED ORDER — NITROGLYCERIN 1 MG/10 ML FOR IR/CATH LAB
INTRA_ARTERIAL | Status: AC
  Filled 2016-04-27: qty 10

## 2016-04-27 MED ORDER — CETYLPYRIDINIUM CHLORIDE 0.05 % MT LIQD
7.0000 mL | Freq: Two times a day (BID) | OROMUCOSAL | Status: DC
  Administered 2016-04-27 – 2016-05-10 (×23): 7 mL via OROMUCOSAL

## 2016-04-27 MED ORDER — IOPAMIDOL (ISOVUE-370) INJECTION 76%
INTRAVENOUS | Status: AC
  Filled 2016-04-27: qty 100

## 2016-04-27 MED ORDER — ASPIRIN 81 MG PO CHEW
81.0000 mg | CHEWABLE_TABLET | Freq: Every day | ORAL | Status: DC
  Administered 2016-04-28 – 2016-05-10 (×13): 81 mg via ORAL
  Filled 2016-04-27 (×13): qty 1

## 2016-04-27 MED ORDER — TIROFIBAN (AGGRASTAT) BOLUS VIA INFUSION
INTRAVENOUS | Status: DC | PRN
  Administered 2016-04-27: 2132.5 ug via INTRAVENOUS

## 2016-04-27 MED ORDER — ACETAMINOPHEN 325 MG PO TABS
650.0000 mg | ORAL_TABLET | ORAL | Status: DC | PRN
  Filled 2016-04-27: qty 2

## 2016-04-27 MED ORDER — TICAGRELOR 90 MG PO TABS
ORAL_TABLET | ORAL | Status: DC | PRN
  Administered 2016-04-27: 180 mg

## 2016-04-27 MED ORDER — HEPARIN SODIUM (PORCINE) 1000 UNIT/ML IJ SOLN
INTRAMUSCULAR | Status: DC | PRN
  Administered 2016-04-27: 5000 [IU] via INTRAVENOUS
  Administered 2016-04-27: 2500 [IU] via INTRAVENOUS
  Administered 2016-04-27: 5000 [IU] via INTRAVENOUS

## 2016-04-27 MED ORDER — TICAGRELOR 90 MG PO TABS: 90.0000 mg | ORAL_TABLET | Freq: Two times a day (BID) | ORAL | Status: DC

## 2016-04-27 MED ORDER — SODIUM CHLORIDE 0.9 % WEIGHT BASED INFUSION: 1.0000 mL/kg/h | INTRAVENOUS | Status: AC

## 2016-04-27 MED ORDER — HEPARIN (PORCINE) IN NACL 2-0.9 UNIT/ML-% IJ SOLN
INTRAMUSCULAR | Status: AC
  Filled 2016-04-27: qty 1000

## 2016-04-27 MED ORDER — PHENYTOIN SODIUM EXTENDED 100 MG PO CAPS
100.0000 mg | ORAL_CAPSULE | Freq: Two times a day (BID) | ORAL | Status: DC
  Administered 2016-04-27 – 2016-05-10 (×26): 100 mg via ORAL
  Filled 2016-04-27 (×27): qty 1

## 2016-04-27 SURGICAL SUPPLY — 23 items
BALLN EMERGE MR 2.5X12 (BALLOONS) ×3
BALLN ~~LOC~~ EUPHORA RX 3.0X20 (BALLOONS) ×3
BALLN ~~LOC~~ EUPHORA RX 3.25X12 (BALLOONS) ×3
BALLOON EMERGE MR 2.5X12 (BALLOONS) ×1 IMPLANT
BALLOON ~~LOC~~ EUPHORA RX 3.0X20 (BALLOONS) ×1 IMPLANT
BALLOON ~~LOC~~ EUPHORA RX 3.25X12 (BALLOONS) ×1 IMPLANT
CATH S G BIP PACING (SET/KITS/TRAYS/PACK) ×3 IMPLANT
CATH SITESEER 5F MULTI A 2 (CATHETERS) ×3 IMPLANT
DEVICE WIRE ANGIOSEAL 6FR (Vascular Products) ×3 IMPLANT
ELECT DEFIB PAD ADLT CADENCE (PAD) ×3 IMPLANT
GUIDE CATH RUNWAY 6FR FR4 (CATHETERS) ×3 IMPLANT
KIT ENCORE 26 ADVANTAGE (KITS) ×3 IMPLANT
KIT HEART LEFT (KITS) ×3 IMPLANT
PACK CARDIAC CATHETERIZATION (CUSTOM PROCEDURE TRAY) ×3 IMPLANT
SHEATH PINNACLE 5F 10CM (SHEATH) IMPLANT
SHEATH PINNACLE 6F 10CM (SHEATH) ×6 IMPLANT
SLEEVE REPOSITIONING LENGTH 30 (MISCELLANEOUS) ×3 IMPLANT
STENT RESOLUTE INTEG 2.75X30 (Permanent Stent) ×3 IMPLANT
STENT RESOLUTE INTEG 3.0X18 (Permanent Stent) ×3 IMPLANT
TRANSDUCER W/STOPCOCK (MISCELLANEOUS) ×3 IMPLANT
TUBING CIL FLEX 10 FLL-RA (TUBING) ×3 IMPLANT
WIRE ASAHI PROWATER 180CM (WIRE) ×3 IMPLANT
WIRE EMERALD 3MM-J .035X150CM (WIRE) ×3 IMPLANT

## 2016-04-27 NOTE — Op Note (Signed)
   Acute inferior ST elevation myocardial infarction complicated by severe bradycardia/Mobitz 2 second-degree heart block with effective heart rate of 29 bpm. Hypotension with systolic blood pressure 90 mmHg.  Cardiogenic shock secondary to the above.  PTCA and stenting of the mid right to proximal right with reduction in 100% to 0%.  50% ostial and 60% segmental mid to distal left main.  90% thrombus filled ostial to proximal circumflex  Distal right coronary fills late by collaterals from left-to-right prior to PTCA.  LVEF 55% with inferobasal akinesis.

## 2016-04-27 NOTE — Progress Notes (Signed)
   04/27/16 1205  Clinical Encounter Type  Visited With Family;Health care provider  Visit Type Initial;Patient in surgery;ED  Referral From Nurse  Stress Factors  Family Stress Factors Lack of knowledge   Chaplain responded to a code STEMI to assist patient's son, Ellington Kaneko, transition to the [facility] waiting area. Chaplain informed staff in the Cath Lab and on [facility] of his presence. Chaplain introduced spiritual care services. Spiritual care services available as needed.   Jeri Lager, Chaplain 04/27/16 12:06 PM

## 2016-04-27 NOTE — H&P (Signed)
Patient ID: RICHIE BREITENBACH MRN: [id number], DOB/AGE: June 03, 1932   Admit date: (Not on file)  Primary Physician: Redge Gainer, MD Primary Cardiologist: New Reason for admission:  STEMI  HPI:  Joshua [name] is a 82 y.o. male with a history of seizure disorder, prostate cancer, hypertension, hyperlipidemia, GERD and no prior cardiac history who presented to [facility] today via EMS as a code STEMI.  On 04/19/16 he underwent left bladder neck tumor removal by Dr. Jeffie Pollock.  The patient woke up to have a bowel movement this morning and shortly after started to feel weak and diaphoretic. He had no chest pain or shortness of breath. EMS was called and initial EKG showed heart rates in the 30s with inferior ST elevation as well as reciprocal changes. His heart rate has sustained in the 20s and 30s and currently he is in second-degree heart block. Currently chest pain free but still feeling quite weak and "woozy."    Problem List  Past Medical History:  Diagnosis Date  . Allergy    pcn  . Arteriovenous malformation    ([place])  . Blood transfusion    right eye  no surgery as yet  . ED (erectile dysfunction)   . GERD (gastroesophageal reflux disease)   . Hypercholesterolemia   . Hypertension   . Prostate cancer ([place]) 11/02/11 bxs   Adenocarcinoma,Gleason=3+3=6 & 3+4=7,PSA+1.58 Volume=19cc  . Seizures ([place])     Past Surgical History:  Procedure Laterality Date  . APPENDECTOMY    . CRANIOTOMY  1980  . CYSTOSCOPY  01/19/2012   Procedure: CYSTOSCOPY;  Surgeon: Malka So, MD;  Location: [facility];  Service: Urology;;  no seeds found in bladder  . CYSTOSCOPY W/ RETROGRADES Bilateral 04/19/2016   Procedure: CYSTOSCOPY WITH BILATERAL RETROGRADE PYELOGRAM TRANSURETHRAL RESECTION OF BLADDER TUMOR ;  Surgeon: [name], MD;  Location: [facility];  Service: Urology;  Laterality: Bilateral;  . RADIOACTIVE SEED IMPLANT  01/19/2012   Procedure: RADIOACTIVE SEED IMPLANT;   Surgeon: Malka So, MD;  Location: [facility];  Service: Urology;  Laterality: N/A;  68 seeds implanted     Allergies  Allergies  Allergen Reactions  . Penicillins Rash    Has patient had a PCN reaction causing immediate rash, facial/tongue/throat swelling, SOB or lightheadedness with hypotension: Yes Has patient had a PCN reaction causing severe rash involving mucus membranes or skin necrosis: No Has patient had a PCN reaction that required hospitalization No Has patient had a PCN reaction occurring within the last 10 years: No If all of the above answers are "NO", then may proceed with Cephalosporin use.      Home Medications  Prior to Admission medications   Medication Sig Start Date End Date Taking? Authorizing Provider  calcium carbonate (OS-CAL) 600 MG TABS tablet Take 600 mg by mouth daily with breakfast.    Historical Provider, MD  HYDROcodone-acetaminophen (NORCO) 5-325 MG tablet Take 1 tablet by mouth every 6 (six) hours as needed for moderate pain. 04/19/16   [name], MD  Multiple Vitamins-Minerals (ICAPS AREDS FORMULA PO) Take 1-2 capsules by mouth 2 (two) times daily. Take 1 capsule in the morning, and 2 capsules in evening    Historical Provider, MD  pantoprazole (PROTONIX) 40 MG tablet TAKE 1 TABLET ONCE DAILY 30 MINUTES BEFORE a MEAL. Patient taking differently: Take 40 mg by mouth daily. TAKE 1 TABLET ONCE DAILY 30 MINUTES BEFORE a MEAL. 01/27/16   Chipper Herb,  MD  PHENobarbital (LUMINAL) 32.4 MG tablet Take 2 tabs in am, 3 tabs pm per pt statement Patient taking differently: Take 62.8-97.2 mg by mouth 2 (two) times daily. Take 2 tabs in am, 3 tabs pm 12/02/15   Chipper Herb, MD  phenytoin (DILANTIN) 100 MG ER capsule 2 caps in the morning and 3 caps QHS Patient taking differently: Take 100 mg by mouth 2 (two) times daily.  12/04/14   Chipper Herb, MD  pravastatin (PRAVACHOL) 80 MG tablet Take 1 tablet (80 mg total) by mouth daily. 04/26/16    Chipper Herb, MD  VITAMIN D, ERGOCALCIFEROL, PO Take 600 Units by mouth daily.     Historical Provider, MD    Family History  Family History  Problem Relation Age of Onset  . Emphysema Mother     age 30 deceased  . Stroke Mother   . Emphysema Father     deceased age 32   Family Status  Relation Status  . Mother Deceased  . Father Deceased  . Sister Deceased at age 35   fell hit head,cranial bleed ,expired  . Brother Deceased   murdered age 16     Social History  Social History   Social History  . Marital status: Widowed    Spouse name: N/A  . Number of children: 2  . Years of education: N/A   Occupational History  .     Social History Main Topics  . Smoking status: Former Smoker    Packs/day: 2.00    Years: 15.00    Quit date: 01/15/1973  . Smokeless tobacco: Never Used  . Alcohol use Yes     Comment: 1- drink q 2-3 weeks  . Drug use: No     Comment: quit smoking 40 years ago  . Sexual activity: Not on file   Other Topics Concern  . Not on file   Social History Narrative  . No narrative on file     All other systems reviewed and are otherwise negative except as noted above.  Physical Exam  There were no vitals taken for this visit.  General: Pleasant, NAD. Appears uncomfortable Psych: Normal affect. Neuro: Alert and oriented X 3. Moves all extremities spontaneously. HEENT: Normal  Neck: Supple without bruits or JVD. Lungs:  Resp regular and unlabored, CTA. Heart: RR brady no s3, s4, or murmurs. Abdomen: Soft, non-tender, non-distended, BS + x 4.  Extremities: No clubbing, cyanosis or edema. DP/PT/Radials 2+ and equal bilaterally.  Labs  No results for input(s): CKTOTAL, CKMB, TROPONINI in the last 72 hours. Lab Results  Component Value Date   WBC 9.2 04/15/2016   HGB 13.0 04/15/2016   HCT 38.7 (L) 04/15/2016   MCV 88.6 04/15/2016   PLT 294 04/15/2016   No results for input(s): NA, K, CL, CO2, BUN, CREATININE, CALCIUM, PROT, BILITOT,  ALKPHOS, ALT, AST, GLUCOSE in the last 168 hours.  Invalid input(s): LABALBU Lab Results  Component Value Date   CHOL 176 01/20/2016   HDL 69 01/20/2016   LDLCALC 78 09/10/2014   TRIG 85 01/20/2016   No results found for: DDIMER   Radiology/Studies  No results found.  ECG: ST elevation in 2, 3 and aVF and reciprocal changes in aVL and V2.  ASSESSMENT AND PLAN  Inferior STEMI with 2nd degree heart block: plan is for emergent coronary angiography and possible PCI. He is bradycardic with heart rates in the 20s and 30s and secondary heart block. Temporary pacing wires will  be placed. If his heart rate does not improve he may need a permanent pacemaker placement. Of note is on no AV nodal blocking agents.

## 2016-04-28 ENCOUNTER — Encounter (HOSPITAL_COMMUNITY): Payer: Self-pay | Admitting: *Deleted

## 2016-04-28 DIAGNOSIS — I441 Atrioventricular block, second degree: Secondary | ICD-10-CM

## 2016-04-28 DIAGNOSIS — R570 Cardiogenic shock: Secondary | ICD-10-CM

## 2016-04-28 DIAGNOSIS — I10 Essential (primary) hypertension: Secondary | ICD-10-CM

## 2016-04-28 LAB — BASIC METABOLIC PANEL
ANION GAP: 4 — AB (ref 5–15)
BUN: 17 mg/dL (ref 6–20)
CALCIUM: 8.6 mg/dL — AB (ref 8.9–10.3)
CO2: 24 mmol/L (ref 22–32)
Chloride: 112 mmol/L — ABNORMAL HIGH (ref 101–111)
Creatinine, Ser: 0.95 mg/dL (ref 0.61–1.24)
GLUCOSE: 95 mg/dL (ref 65–99)
POTASSIUM: 4.2 mmol/L (ref 3.5–5.1)
Sodium: 140 mmol/L (ref 135–145)

## 2016-04-28 LAB — CBC WITH DIFFERENTIAL/PLATELET
BASOS ABS: 0 10*3/uL (ref 0.0–0.1)
BASOS ABS: 0 10*3/uL (ref 0.0–0.1)
BASOS PCT: 0 %
BASOS PCT: 0 %
Basophils Absolute: 0 10*3/uL (ref 0.0–0.1)
Basophils Relative: 0 %
EOS ABS: 0.2 10*3/uL (ref 0.0–0.7)
EOS PCT: 1 %
Eosinophils Absolute: 0.1 10*3/uL (ref 0.0–0.7)
Eosinophils Absolute: 0.2 10*3/uL (ref 0.0–0.7)
Eosinophils Relative: 1 %
Eosinophils Relative: 2 %
HCT: 35.7 % — ABNORMAL LOW (ref 39.0–52.0)
HCT: 34.7 % — ABNORMAL LOW (ref 39.0–52.0)
HEMATOCRIT: 34.6 % — AB (ref 39.0–52.0)
HEMOGLOBIN: 11.2 g/dL — AB (ref 13.0–17.0)
HEMOGLOBIN: 11.1 g/dL — AB (ref 13.0–17.0)
Hemoglobin: 11.4 g/dL — ABNORMAL LOW (ref 13.0–17.0)
LYMPHS ABS: 2.3 10*3/uL (ref 0.7–4.0)
LYMPHS PCT: 19 %
Lymphocytes Relative: 19 %
Lymphocytes Relative: 17 %
Lymphs Abs: 2.1 10*3/uL (ref 0.7–4.0)
Lymphs Abs: 1.9 10*3/uL (ref 0.7–4.0)
MCH: 28.8 pg (ref 26.0–34.0)
MCH: 29.6 pg (ref 26.0–34.0)
MCH: 29.4 pg (ref 26.0–34.0)
MCHC: 31.9 g/dL (ref 30.0–36.0)
MCHC: 32.3 g/dL (ref 30.0–36.0)
MCHC: 32.1 g/dL (ref 30.0–36.0)
MCV: 90.2 fL (ref 78.0–100.0)
MCV: 91.6 fL (ref 78.0–100.0)
MCV: 91.5 fL (ref 78.0–100.0)
MONO ABS: 0.7 10*3/uL (ref 0.1–1.0)
Monocytes Absolute: 1.1 10*3/uL — ABNORMAL HIGH (ref 0.1–1.0)
Monocytes Absolute: 1.3 10*3/uL — ABNORMAL HIGH (ref 0.1–1.0)
Monocytes Relative: 10 %
Monocytes Relative: 10 %
Monocytes Relative: 7 %
NEUTROS ABS: 7.2 10*3/uL (ref 1.7–7.7)
NEUTROS PCT: 72 %
NEUTROS PCT: 72 %
Neutro Abs: 7.9 10*3/uL — ABNORMAL HIGH (ref 1.7–7.7)
Neutro Abs: 9.5 10*3/uL — ABNORMAL HIGH (ref 1.7–7.7)
Neutrophils Relative %: 70 %
PLATELETS: 215 10*3/uL (ref 150–400)
Platelets: 232 10*3/uL (ref 150–400)
Platelets: 222 10*3/uL (ref 150–400)
RBC: 3.96 MIL/uL — ABNORMAL LOW (ref 4.22–5.81)
RBC: 3.79 MIL/uL — AB (ref 4.22–5.81)
RBC: 3.78 MIL/uL — AB (ref 4.22–5.81)
RDW: 14.2 % (ref 11.5–15.5)
RDW: 14.2 % (ref 11.5–15.5)
RDW: 14.4 % (ref 11.5–15.5)
WBC: 11.3 10*3/uL — ABNORMAL HIGH (ref 4.0–10.5)
WBC: 13.3 10*3/uL — AB (ref 4.0–10.5)
WBC: 10 10*3/uL (ref 4.0–10.5)

## 2016-04-28 LAB — TROPONIN I
TROPONIN I: 12.15 ng/mL — AB (ref <0.03)
TROPONIN I: 9.62 ng/mL — AB (ref <0.03)
Troponin I: 14.01 ng/mL (ref <0.03)

## 2016-04-28 LAB — HEMOGLOBIN A1C
Hgb A1c MFr Bld: 5.5 % (ref 4.8–5.6)
Mean Plasma Glucose: 111 mg/dL

## 2016-04-28 LAB — MRSA PCR SCREENING: MRSA by PCR: NEGATIVE

## 2016-04-28 MED ORDER — SODIUM CHLORIDE 0.9 % IV SOLN
INTRAVENOUS | Status: DC
  Administered 2016-04-29: 14:00:00 via INTRAVENOUS

## 2016-04-28 MED FILL — Ticagrelor Tab 90 MG: ORAL | Qty: 200 | Status: AC

## 2016-04-28 MED FILL — Ticagrelor Tab 90 MG: ORAL | Qty: 2 | Status: AC

## 2016-04-28 NOTE — Progress Notes (Signed)
Heart rate as low as 32 this shift. Pt. Has remained stable with this rate. Will continue to monitor.

## 2016-04-28 NOTE — Progress Notes (Signed)
Patient Name: Joshua [name] Date of Encounter: 04/28/2016  Principal Problem:   ST elevation (STEMI) myocardial infarction involving other coronary artery of inferior wall (HCC) Active Problems:   Hypercholesterolemia   Essential hypertension   AV block, Mobitz 2   STEMI (ST elevation myocardial infarction) ([place])   Length of Stay: 1  SUBJECTIVE  The patient is chest pain free, SOB has resolved as well.   CURRENT MEDS . antiseptic oral rinse  7 mL Mouth Rinse BID  . aspirin  81 mg Oral Daily  . atorvastatin  80 mg Oral q1800  . clopidogrel  75 mg Oral Daily  . heparin  5,000 Units Subcutaneous Q8H  . phenobarbital  64.8 mg Oral q morning - 10a   And  . phenobarbital  97.2 mg Oral QPM  . phenytoin  100 mg Oral BID  . sodium chloride flush  3 mL Intravenous Q12H   OBJECTIVE  Vitals:   04/28/16 0400 04/28/16 0500 04/28/16 0800 04/28/16 0900  BP: 128/71 114/71  (!) 124/106  Pulse: (!) 59 60  63  Resp: 11 (!) 7  19  Temp: 98 F (36.7 C)  98 F (36.7 C)   TempSrc: Oral  Oral   SpO2: 96% 97%  100%  Weight:      Height:        Intake/Output Summary (Last 24 hours) at 04/28/16 1004 Last data filed at 04/28/16 0900  Gross per 24 hour  Intake          3513.67 ml  Output              480 ml  Net          3033.67 ml   Filed Weights   04/27/16 1337  Weight: 190 lb 4.1 oz (86.3 kg)    PHYSICAL EXAM  General: Pleasant, NAD. Neuro: Alert and oriented X 3. Moves all extremities spontaneously. Psych: Normal affect. HEENT:  Normal  Neck: Supple without bruits or JVD. Lungs:  Resp regular and unlabored, CTA. Heart: RRR no s3, s4, or murmurs. Abdomen: Soft, non-tender, non-distended, BS + x 4.  Extremities: No clubbing, cyanosis or edema. DP/PT/Radials 2+ and equal bilaterally.  Accessory Clinical Findings  CBC  Recent Labs  04/28/16 0255 04/28/16 0800  WBC 11.3* 13.3*  NEUTROABS 7.9* 9.5*  HGB 11.4* 11.2*  HCT 35.7* 34.7*  MCV 90.2 91.6  PLT 232 215     Basic Metabolic Panel  Recent Labs  04/27/16 1133 04/27/16 1429 04/28/16 0255  NA 139  --  140  K 3.8  --  4.2  CL 113*  --  112*  CO2 19*  --  24  GLUCOSE 186*  --  95  BUN 16  --  17  CREATININE 1.02 1.07 0.95  CALCIUM 8.6*  --  8.6*   Liver Function Tests  Recent Labs  04/27/16 1133  AST 73*  ALT 29  ALKPHOS 123  BILITOT 0.5  PROT 5.9*  ALBUMIN 3.2*    Recent Labs  04/27/16 2025 04/28/16 0255 04/28/16 0800  TROPONINI 10.87* 14.01* 12.15*    Recent Labs  04/27/16 1133  HGBA1C 5.5   Fasting Lipid Panel  Recent Labs  04/27/16 1133  CHOL 144  HDL 43  LDLCALC 85  TRIG 80  CHOLHDL 3.3   TELE: Mobitz type 2.AVB  CATH: 04/28/16  Acute inferior ST elevation myocardial infarction complicated by severe bradycardia/Mobitz 2 second-degree heart block with effective heart rate of 29 bpm.  Hypotension with systolic blood pressure 90 mmHg.  Cardiogenic shock secondary to the above.  PTCA and stenting of the mid right to proximal right with reduction in 100% to 0%.  50% ostial and 60% segmental mid to distal left main.  90% thrombus filled ostial to proximal circumflex  Distal right coronary fills late by collaterals from left-to-right prior to PTCA.  LVEF 55% with inferobasal akinesis.    ASSESSMENT AND PLAN  82 year old male with   1. Inferior STEMI - Presentation with nausea, vomiting, sudden onset of weakness, profound bradycardia with second-degree heart block, effective heart rate of 29 bpm and cardiogenic shock.  Successful insertion of temporary pacemaker wire for symptomatic bradycardia.  Total occlusion of the mid RCA reduced to 0% after drug-eluting stent implantation using a Resolute Integrity 2.75 x 30 mm drug-eluting stent postdilated to 3.0.  Successful angioplasty of the proximal right coronary from 80% to 0% with TIMI grade 3 flow using a resolute Integrity 3.0 x 18 mm DES postdilated to 3.25 mm in diameter.  Segmental 60%  distal left main and 50% ostial involvement.  90% ostial to proximal circumflex containing thrombus.  Otherwise widely patent circumflex and LAD vessels.  Inferior wall akinesis. EF 50% with low LVEDP.   Continue temporary transvenous pacemaker until recovery of AV conduction.  Aggrastat 18 hours.  Aspirin and Brilinta.  Hopeful conservative management for 4-6 weeks then consider re-study to look at the circumflex coronary artery (may be required prior to discharge if recurrent anginal/ischemic symptoms) and make a decision at that time about the possibility of coronary bypass grafting for left main disease.   - temporary PM is now off, telemetry shows Mobitz type 2.AVB, ventricular rate 50-60 BPM, I would wait till tomorrow to remove the temporary pacer/make final decision about necessity of permanent pacing  - the patient is chest pain free, he will remain in CCU till tomorrow.   Signed, Ena Dawley MD, [facility] 04/28/2016

## 2016-04-28 NOTE — Care Management Note (Signed)
Case Management Note  Patient Details  Name: Joshua [name] MRN: [id number] Date of Birth: 17-Mar-1932  Subjective/Objective:  82 y.o. F admitted 04/27/2016 with STEMI who developed 2 degree HB and Hypotension. Currently has Temp Pacer. Under consideration for Pernanent Pacer. Independent in [place].                 Action/Plan:CM will continue to follow for Discharge/Disposition needs.   Expected Discharge Date:  04/30/16               Expected Discharge Plan:  Home/Self Care  In-House Referral:     Discharge planning Services  CM Consult  Post Acute Care Choice:    Choice offered to:     DME Arranged:    DME Agency:     HH Arranged:    HH Agency:     Status of Service:  In process, will continue to follow  If discussed at Long Length of Stay Meetings, dates discussed:    Additional Comments:  Delrae Sawyers, RN 04/28/2016, 11:28 AM

## 2016-04-29 DIAGNOSIS — I440 Atrioventricular block, first degree: Secondary | ICD-10-CM

## 2016-04-29 DIAGNOSIS — R319 Hematuria, unspecified: Secondary | ICD-10-CM

## 2016-04-29 DIAGNOSIS — E7800 Pure hypercholesterolemia, unspecified: Secondary | ICD-10-CM

## 2016-04-29 LAB — CBC WITH DIFFERENTIAL/PLATELET
BASOS ABS: 0.1 10*3/uL (ref 0.0–0.1)
BASOS ABS: 0 10*3/uL (ref 0.0–0.1)
BASOS PCT: 1 %
BASOS PCT: 0 %
BASOS PCT: 0 %
Basophils Absolute: 0.1 10*3/uL (ref 0.0–0.1)
Basophils Absolute: 0.1 10*3/uL (ref 0.0–0.1)
Basophils Relative: 1 %
EOS ABS: 0.5 10*3/uL (ref 0.0–0.7)
EOS ABS: 0.5 10*3/uL (ref 0.0–0.7)
EOS PCT: 4 %
EOS PCT: 4 %
Eosinophils Absolute: 0.4 10*3/uL (ref 0.0–0.7)
Eosinophils Absolute: 0.4 10*3/uL (ref 0.0–0.7)
Eosinophils Relative: 4 %
Eosinophils Relative: 4 %
HCT: 32.7 % — ABNORMAL LOW (ref 39.0–52.0)
HCT: 34.6 % — ABNORMAL LOW (ref 39.0–52.0)
HCT: 34.5 % — ABNORMAL LOW (ref 39.0–52.0)
HCT: 35.6 % — ABNORMAL LOW (ref 39.0–52.0)
Hemoglobin: 11 g/dL — ABNORMAL LOW (ref 13.0–17.0)
Hemoglobin: 11.1 g/dL — ABNORMAL LOW (ref 13.0–17.0)
Hemoglobin: 11.3 g/dL — ABNORMAL LOW (ref 13.0–17.0)
Hemoglobin: 11.5 g/dL — ABNORMAL LOW (ref 13.0–17.0)
LYMPHS ABS: 2.8 10*3/uL (ref 0.7–4.0)
LYMPHS PCT: 25 %
LYMPHS PCT: 20 %
Lymphocytes Relative: 19 %
Lymphocytes Relative: 18 %
Lymphs Abs: 2.2 10*3/uL (ref 0.7–4.0)
Lymphs Abs: 1.9 10*3/uL (ref 0.7–4.0)
Lymphs Abs: 2.1 10*3/uL (ref 0.7–4.0)
MCH: 30.4 pg (ref 26.0–34.0)
MCH: 29.4 pg (ref 26.0–34.0)
MCH: 29.9 pg (ref 26.0–34.0)
MCH: 29.6 pg (ref 26.0–34.0)
MCHC: 33.6 g/dL (ref 30.0–36.0)
MCHC: 32.1 g/dL (ref 30.0–36.0)
MCHC: 32.8 g/dL (ref 30.0–36.0)
MCHC: 32.3 g/dL (ref 30.0–36.0)
MCV: 90.3 fL (ref 78.0–100.0)
MCV: 91.8 fL (ref 78.0–100.0)
MCV: 91.3 fL (ref 78.0–100.0)
MCV: 91.8 fL (ref 78.0–100.0)
MONO ABS: 1.1 10*3/uL — AB (ref 0.1–1.0)
MONO ABS: 1.3 10*3/uL — AB (ref 0.1–1.0)
MONO ABS: 1 10*3/uL (ref 0.1–1.0)
MONO ABS: 1.2 10*3/uL — AB (ref 0.1–1.0)
MONOS PCT: 11 %
MONOS PCT: 10 %
Monocytes Relative: 10 %
Monocytes Relative: 10 %
Neutro Abs: 7 10*3/uL (ref 1.7–7.7)
Neutro Abs: 7.6 10*3/uL (ref 1.7–7.7)
Neutro Abs: 6.2 10*3/uL (ref 1.7–7.7)
Neutro Abs: 7.7 10*3/uL (ref 1.7–7.7)
Neutrophils Relative %: 60 %
Neutrophils Relative %: 65 %
Neutrophils Relative %: 66 %
Neutrophils Relative %: 68 %
PLATELETS: 212 10*3/uL (ref 150–400)
PLATELETS: 207 10*3/uL (ref 150–400)
Platelets: 200 10*3/uL (ref 150–400)
Platelets: 213 10*3/uL (ref 150–400)
RBC: 3.62 MIL/uL — ABNORMAL LOW (ref 4.22–5.81)
RBC: 3.77 MIL/uL — ABNORMAL LOW (ref 4.22–5.81)
RBC: 3.78 MIL/uL — ABNORMAL LOW (ref 4.22–5.81)
RBC: 3.88 MIL/uL — ABNORMAL LOW (ref 4.22–5.81)
RDW: 14.2 % (ref 11.5–15.5)
RDW: 14.1 % (ref 11.5–15.5)
RDW: 14.1 % (ref 11.5–15.5)
RDW: 14 % (ref 11.5–15.5)
WBC: 11.4 10*3/uL — ABNORMAL HIGH (ref 4.0–10.5)
WBC: 11.6 10*3/uL — ABNORMAL HIGH (ref 4.0–10.5)
WBC: 9.5 10*3/uL (ref 4.0–10.5)
WBC: 11.4 10*3/uL — ABNORMAL HIGH (ref 4.0–10.5)

## 2016-04-29 LAB — TROPONIN I
TROPONIN I: 8.31 ng/mL — AB (ref <0.03)
TROPONIN I: 5.47 ng/mL — AB (ref <0.03)
TROPONIN I: 5.4 ng/mL — AB (ref <0.03)
Troponin I: 6.84 ng/mL (ref <0.03)

## 2016-04-29 NOTE — Progress Notes (Signed)
ELECTROPHYSIOLOGY CONSULT NOTE    Patient ID: Joshua [name] MRN: [id number], DOB/AGE: 1932/06/05 82 y.o.  Admit date: 04/27/2016 Date of Consult: 04/29/2016  Primary Physician: Redge Gainer, MD Primary Cardiologist: Dr. Tamala Julian (new) Requesting MD: Dr. [name]  Reason for Consultation: heart block  HPI: Joshua [name] is a 82 y.o. male admitted to [facility] 04/27/16 sudden profuse sweating, profound weakness, N/V, weakness, noted with inferior STEMI, profound bradycardia with HR 29bpm in Mobitz II heart block.  PMHx includes seizure d/o, prostate cancer, HTN, HLD, GERD, s/p recent removal L bladder neck tumor removal.  The patient underwent emergent temp pacing wire insertion, LHC and PCI to RCA with DES, noting as well thrombotic 90% ostial Circ. (circ lesion planned for conservative management at this time).  The patient today had persistent episodes of 2:1 heart block requiring temp pacing, EP is asked to evaluate.  The patient is feeling significantly improved. Reports never really had CP, no CP now, yesterday felt like he had palpitations, no SOB.  LABS: K+ 4.2 BUN/Creat 17/0.95 H/H 11.1/34.6 plts 200 WBC 11.6 Trop is trending down  Past Medical History:  Diagnosis Date  . Allergy    pcn  . Arteriovenous malformation    ([place])  . Blood transfusion    right eye  no surgery as yet  . ED (erectile dysfunction)   . GERD (gastroesophageal reflux disease)   . Hypercholesterolemia   . Hypertension   . Prostate cancer ([place]) 11/02/11 bxs   Adenocarcinoma,Gleason=3+3=6 & 3+4=7,PSA+1.58 Volume=19cc  . Seizures [facility])      Surgical History:  Past Surgical History:  Procedure Laterality Date  . APPENDECTOMY    . CARDIAC CATHETERIZATION N/A 04/27/2016   Procedure: Left Heart Cath and Coronary Angiography;  Surgeon: Belva Crome, MD;  Location: [place] [facility];  Service: Cardiovascular;  Laterality: N/A;  . CARDIAC CATHETERIZATION N/A 04/27/2016   Procedure: Temporary Pacemaker;   Surgeon: Belva Crome, MD;  Location: [place] [facility];  Service: Cardiovascular;  Laterality: N/A;  . CARDIAC CATHETERIZATION N/A 04/27/2016   Procedure: Coronary Stent Intervention;  Surgeon: Belva Crome, MD;  Location: [place] [facility];  Service: Cardiovascular;  Laterality: N/A;  Proximal RCA Mid RCA  . CRANIOTOMY  1980  . CYSTOSCOPY  01/19/2012   Procedure: CYSTOSCOPY;  Surgeon: Malka So, MD;  Location: [facility];  Service: Urology;;  no seeds found in bladder  . CYSTOSCOPY W/ RETROGRADES Bilateral 04/19/2016   Procedure: CYSTOSCOPY WITH BILATERAL RETROGRADE PYELOGRAM TRANSURETHRAL RESECTION OF BLADDER TUMOR ;  Surgeon: [name], MD;  Location: [facility];  Service: Urology;  Laterality: Bilateral;  . RADIOACTIVE SEED IMPLANT  01/19/2012   Procedure: RADIOACTIVE SEED IMPLANT;  Surgeon: Malka So, MD;  Location: [facility];  Service: Urology;  Laterality: N/A;  68 seeds implanted     Prescriptions Prior to Admission  Medication Sig Dispense Refill Last Dose  . calcium carbonate (OS-CAL) 600 MG TABS tablet Take 600 mg by mouth daily with breakfast.   04/26/2016 at Unknown time  . Multiple Vitamins-Minerals (ICAPS AREDS FORMULA PO) Take 1-2 capsules by mouth 2 (two) times daily. Take 1 capsule in the morning, and 2 capsules in evening   04/26/2016 at Unknown time  . pantoprazole (PROTONIX) 40 MG tablet TAKE 1 TABLET ONCE DAILY 30 MINUTES BEFORE a MEAL. (Patient taking differently: Take 40 mg by mouth daily. TAKE 1 TABLET ONCE DAILY 30 MINUTES BEFORE a MEAL.) 90 tablet 3 04/26/2016  at Unknown time  . PHENobarbital (LUMINAL) 32.4 MG tablet Take 2 tabs in am, 3 tabs pm per pt statement (Patient taking differently: Take 62.8-97.2 mg by mouth 2 (two) times daily. Take 2 tabs in am, 3 tabs pm) 450 tablet 3 04/26/2016 at Unknown time  . phenytoin (DILANTIN) 100 MG ER capsule 2 caps in the morning and 3 caps QHS (Patient taking differently: Take 100 mg by mouth 2 (two) times  daily. ) 450 capsule 3 04/26/2016 at Unknown time  . pravastatin (PRAVACHOL) 80 MG tablet Take 1 tablet (80 mg total) by mouth daily. 90 tablet 0 04/26/2016 at Unknown time  . VITAMIN D, ERGOCALCIFEROL, PO Take 600 Units by mouth daily.    04/26/2016 at Unknown time  . HYDROcodone-acetaminophen (NORCO) 5-325 MG tablet Take 1 tablet by mouth every 6 (six) hours as needed for moderate pain. 10 tablet 0     Inpatient Medications:  . antiseptic oral rinse  7 mL Mouth Rinse BID  . aspirin  81 mg Oral Daily  . atorvastatin  80 mg Oral q1800  . clopidogrel  75 mg Oral Daily  . heparin  5,000 Units Subcutaneous Q8H  . phenobarbital  64.8 mg Oral q morning - 10a   And  . phenobarbital  97.2 mg Oral QPM  . phenytoin  100 mg Oral BID  . sodium chloride flush  3 mL Intravenous Q12H    Allergies:  Allergies  Allergen Reactions  . Penicillins Rash    Has patient had a PCN reaction causing immediate rash, facial/tongue/throat swelling, SOB or lightheadedness with hypotension: Yes Has patient had a PCN reaction causing severe rash involving mucus membranes or skin necrosis: No Has patient had a PCN reaction that required hospitalization No Has patient had a PCN reaction occurring within the last 10 years: No If all of the above answers are "NO", then may proceed with Cephalosporin use.     Social History   Social History  . Marital status: Widowed    Spouse name: N/A  . Number of children: 2  . Years of education: N/A   Occupational History  .     Social History Main Topics  . Smoking status: Former Smoker    Packs/day: 2.00    Years: 15.00    Quit date: 01/15/1973  . Smokeless tobacco: Never Used  . Alcohol use Yes     Comment: 1- drink q 2-3 weeks  . Drug use: No     Comment: quit smoking 40 years ago  . Sexual activity: Not on file   Other Topics Concern  . Not on file   Social History Narrative  . No narrative on file     Family History  Problem Relation Age of Onset  .  Emphysema Mother     age 48 deceased  . Stroke Mother   . Emphysema Father     deceased age 69     Review of Systems: All other systems reviewed and are otherwise negative except as noted above.  Physical Exam: Vitals:   04/29/16 0500 04/29/16 0600 04/29/16 0700 04/29/16 0800  BP: 140/86 (!) 143/74 (!) 143/75 138/70  Pulse: 65 64 (!) 58 (!) 59  Resp: 19 (!) 21 (!) 22 (!) 23  Temp:      TempSrc:      SpO2: 99% 97% 98% 98%  Weight:      Height:        GEN- The patient is well appearing, alert and oriented  x 3 today.   HEENT: normocephalic, atraumatic; sclera clear, conjunctiva pink; hearing intact; oropharynx clear; neck supple, no JVP Lymph- no cervical lymphadenopathy Lungs- Clear to ausculation bilaterally, normal work of breathing.  No wheezes, rales, rhonchi Heart- Irregular rate and rhythm, no murmurs, rubs or gallops, PMI not laterally displaced GI- soft, non-tender, non-distended, bowel sounds present Extremities- no clubbing, cyanosis, or edema,warm  Pacer site without ecchymosis MS- no significant deformity or atrophy Skin- warm and dry, no rash or lesion Psych- euthymic mood, full affect Neuro- no gross deficits observed  Labs:   Lab Results  Component Value Date   WBC 11.6 (H) 04/29/2016   HGB 11.1 (L) 04/29/2016   HCT 34.6 (L) 04/29/2016   MCV 91.8 04/29/2016   PLT 200 04/29/2016    Recent Labs Lab 04/27/16 1133  04/28/16 0255  NA 139  --  140  K 3.8  --  4.2  CL 113*  --  112*  CO2 19*  --  24  BUN 16  --  17  CREATININE 1.02  < > 0.95  CALCIUM 8.6*  --  8.6*  PROT 5.9*  --   --   BILITOT 0.5  --   --   ALKPHOS 123  --   --   ALT 29  --   --   AST 73*  --   --   GLUCOSE 186*  --  95  < > = values in this interval not displayed.    Radiology/Studies: No results found.  EKG: V paced Presenting 2:1 AVBlock, inferior ST elevation (04/15/16 SB, 54bpm, marked 1st degree AVBlock (556ms), QRS 2ms) TELEMETRY: second degree AVBlock Wenckebach  with rates into 30's at times with 2:1 conduction, V pacing    Assessment and Plan:   1. S/p inferior STEMI     BP stable     On ASA/plavix  2. 1AVB profound - noted on preop ECG>> resolved on telemetry yesterday  3.  Persistent AVBlock, wenckebach today, requiring temp pacing   4.Syncope   I   Signed, Tommye Standard, PA-C 04/29/2016 12:02 PM   Patient with acute inferior wall MI complicated by bradycardia which is described but I find no electrocardiograms in epic prior to temporary pacing  ECG 04/15/16 demonstrated sinus rhythm with profound first degree AV block (450-500 ms); interestingly, telemetry from 8/10 demonstrated sinus rhythm with normal PR interval.  I wonder whether the profound first degree AV block could have been a minifestation of AV nodal ischemia  He had an episode of abrupt syncope while traveling to [place] in July, with prolonged recovery suggestive of either concussion or an non arrhythmic event;  No prior syncope  He has had progressive DOE over recent months according to his daughter.  Unassociated with chest pain, this could be ischemia or related to his conduction system diseaese .  He has significant baseline limitations related to a chronic focal seizure disorder afflicting his right leg  Heart block in the setting of an inferior wall MI is mostly thought likely to resolve without the need for permanent pacing. Issues related to antecedent first-degree AV block are not something that I am familiar with, although I would anticipate that it might make the likelihood of complete resolution lasts. Having said that, interval normalization as noted on telemetry makes me wonder as to the cause of the first degree AV block prior to surgery and whether it might have been a manifestation of some degree of AV nodal ischemia  I  have turned the pacer rate down to 30.  If he doesn't need pacing I would pull temp tomorrow or Sunday Determination as to need for pacing will  likely fall to the middle of next week

## 2016-04-29 NOTE — Progress Notes (Signed)
Pts heart rate dropping into the 30s and staying there.Pt is asymptomatic. Turned temp pacer on VVI @ 60. Notified Cardiology. Will continue to monitor closely.

## 2016-04-29 NOTE — Progress Notes (Signed)
Patient Name: Joshua [name] Date of Encounter: 04/29/2016  Principal Problem:   ST elevation (STEMI) myocardial infarction involving other coronary artery of inferior wall (HCC) Active Problems:   Hypercholesterolemia   Essential hypertension   AV block, Mobitz 2   STEMI (ST elevation myocardial infarction) ([place])   Cardiogenic shock ([place])   Length of Stay: 2  SUBJECTIVE  The patient is chest pain free, he has mild SOB. His HR went down to 30' and has developed 2:1 block again.  He had a bright blood and blood clots in the urine yesterday, but resolved today, only blood tinged urine today.  CURRENT MEDS . antiseptic oral rinse  7 mL Mouth Rinse BID  . aspirin  81 mg Oral Daily  . atorvastatin  80 mg Oral q1800  . clopidogrel  75 mg Oral Daily  . heparin  5,000 Units Subcutaneous Q8H  . phenobarbital  64.8 mg Oral q morning - 10a   And  . phenobarbital  97.2 mg Oral QPM  . phenytoin  100 mg Oral BID  . sodium chloride flush  3 mL Intravenous Q12H   OBJECTIVE  Vitals:   04/29/16 0500 04/29/16 0600 04/29/16 0700 04/29/16 0800  BP: 140/86 (!) 143/74 (!) 143/75 138/70  Pulse: 65 64 (!) 58 (!) 59  Resp: 19 (!) 21 (!) 22 (!) 23  Temp:      TempSrc:      SpO2: 99% 97% 98% 98%  Weight:      Height:        Intake/Output Summary (Last 24 hours) at 04/29/16 1131 Last data filed at 04/29/16 0931  Gross per 24 hour  Intake              553 ml  Output             1580 ml  Net            -1027 ml   Filed Weights   04/27/16 1337  Weight: 190 lb 4.1 oz (86.3 kg)    PHYSICAL EXAM  General: Pleasant, NAD. Neuro: Alert and oriented X 3. Moves all extremities spontaneously. Psych: Normal affect. HEENT:  Normal  Neck: Supple without bruits or JVD. Lungs:  Resp regular and unlabored, crackles at the bases Heart: RRR no s3, s4, or murmurs. Abdomen: Soft, non-tender, non-distended, BS + x 4.  Extremities: No clubbing, cyanosis or edema. DP/PT/Radials 2+ and equal  bilaterally.  Accessory Clinical Findings  CBC  Recent Labs  04/29/16 0341 04/29/16 0750  WBC 11.4* 11.6*  NEUTROABS 7.0 7.6  HGB 11.0* 11.1*  HCT 32.7* 34.6*  MCV 90.3 91.8  PLT 212 [id number]   Basic Metabolic Panel  Recent Labs  04/27/16 1133 04/27/16 1429 04/28/16 0255  NA 139  --  140  K 3.8  --  4.2  CL 113*  --  112*  CO2 19*  --  24  GLUCOSE 186*  --  95  BUN 16  --  17  CREATININE 1.02 1.07 0.95  CALCIUM 8.6*  --  8.6*   Liver Function Tests  Recent Labs  04/27/16 1133  AST 73*  ALT 29  ALKPHOS 123  BILITOT 0.5  PROT 5.9*  ALBUMIN 3.2*    Recent Labs  04/28/16 1500 04/29/16 0341 04/29/16 0750  TROPONINI 9.62* 8.31* 6.84*    Recent Labs  04/27/16 1133  HGBA1C 5.5   Fasting Lipid Panel  Recent Labs  04/27/16 1133  CHOL 144  HDL 43  LDLCALC 85  TRIG 80  CHOLHDL 3.3   TELE: Mobitz type 2.AVB  CATH: 04/28/16  Acute inferior ST elevation myocardial infarction complicated by severe bradycardia/Mobitz 2 second-degree heart block with effective heart rate of 29 bpm. Hypotension with systolic blood pressure 90 mmHg.  Cardiogenic shock secondary to the above.  PTCA and stenting of the mid right to proximal right with reduction in 100% to 0%.  50% ostial and 60% segmental mid to distal left main.  90% thrombus filled ostial to proximal circumflex  Distal right coronary fills late by collaterals from left-to-right prior to PTCA.  LVEF 55% with inferobasal akinesis.    ASSESSMENT AND PLAN  82 year old male with   1. Inferior STEMI - Presentation with nausea, vomiting, sudden onset of weakness, profound bradycardia with second-degree heart block, effective heart rate of 29 bpm and cardiogenic shock.  Successful insertion of temporary pacemaker wire for symptomatic bradycardia.  Total occlusion of the mid RCA reduced to 0% after drug-eluting stent implantation using a Resolute Integrity 2.75 x 30 mm drug-eluting stent postdilated to  3.0.  Successful angioplasty of the proximal right coronary from 80% to 0% with TIMI grade 3 flow using a resolute Integrity 3.0 x 18 mm DES postdilated to 3.25 mm in diameter.  Segmental 60% distal left main and 50% ostial involvement.  90% ostial to proximal circumflex containing thrombus.  Otherwise widely patent circumflex and LAD vessels.  Inferior wall akinesis. EF 50% with low LVEDP.  Aspirin and Brilinta.    Dr Tamala Julian reviewed the images yesterday and wants to continue conservative management for 4-6 weeks then consider re-study to look at the circumflex coronary artery (may be required prior to discharge if recurrent anginal/ischemic symptoms) and make a decision at that time about the possibility of coronary bypass grafting for left main disease.  The patient remains bradycardic with intermittent Mobitz I but also 2:1 block, currently being paced.   We will ask EP to evaluate if early PM is indicated or we should wait.   2. 2: 1 AVB - as above  3. Acute on chronic combined systolic and diastolic CHF - start lasix 40 mg iv BID and reassess in the am, no lasix prior to admission  4. Hematuria - the patient had bladder tumors removed just a week ago, gross hematuria yesterday, but clearing now.  Continue DAPT, Hb 11.1.   Signed, Ena Dawley MD, [facility] 04/29/2016

## 2016-04-30 DIAGNOSIS — I440 Atrioventricular block, first degree: Secondary | ICD-10-CM

## 2016-04-30 LAB — CBC WITH DIFFERENTIAL/PLATELET
BASOS ABS: 0 10*3/uL (ref 0.0–0.1)
BASOS ABS: 0 10*3/uL (ref 0.0–0.1)
BASOS ABS: 0 10*3/uL (ref 0.0–0.1)
BASOS PCT: 0 %
BASOS PCT: 0 %
BASOS PCT: 0 %
BASOS PCT: 0 %
Basophils Absolute: 0 10*3/uL (ref 0.0–0.1)
EOS ABS: 0.5 10*3/uL (ref 0.0–0.7)
EOS ABS: 0.5 10*3/uL (ref 0.0–0.7)
Eosinophils Absolute: 0.5 10*3/uL (ref 0.0–0.7)
Eosinophils Absolute: 0.3 10*3/uL (ref 0.0–0.7)
Eosinophils Relative: 4 %
Eosinophils Relative: 4 %
Eosinophils Relative: 4 %
Eosinophils Relative: 2 %
HEMATOCRIT: 33.6 % — AB (ref 39.0–52.0)
HEMATOCRIT: 35.4 % — AB (ref 39.0–52.0)
HEMATOCRIT: 35.7 % — AB (ref 39.0–52.0)
HEMATOCRIT: 37.2 % — AB (ref 39.0–52.0)
HEMOGLOBIN: 11.7 g/dL — AB (ref 13.0–17.0)
HEMOGLOBIN: 12.3 g/dL — AB (ref 13.0–17.0)
Hemoglobin: 11.5 g/dL — ABNORMAL LOW (ref 13.0–17.0)
Hemoglobin: 11.8 g/dL — ABNORMAL LOW (ref 13.0–17.0)
LYMPHS ABS: 2.5 10*3/uL (ref 0.7–4.0)
LYMPHS PCT: 17 %
LYMPHS PCT: 14 %
Lymphocytes Relative: 19 %
Lymphocytes Relative: 18 %
Lymphs Abs: 2.3 10*3/uL (ref 0.7–4.0)
Lymphs Abs: 2.1 10*3/uL (ref 0.7–4.0)
Lymphs Abs: 2.1 10*3/uL (ref 0.7–4.0)
MCH: 30.5 pg (ref 26.0–34.0)
MCH: 29.5 pg (ref 26.0–34.0)
MCH: 29.6 pg (ref 26.0–34.0)
MCH: 29.5 pg (ref 26.0–34.0)
MCHC: 34.2 g/dL (ref 30.0–36.0)
MCHC: 33.1 g/dL (ref 30.0–36.0)
MCHC: 33.1 g/dL (ref 30.0–36.0)
MCHC: 33.1 g/dL (ref 30.0–36.0)
MCV: 89.1 fL (ref 78.0–100.0)
MCV: 89.4 fL (ref 78.0–100.0)
MCV: 89.5 fL (ref 78.0–100.0)
MCV: 89.2 fL (ref 78.0–100.0)
MONO ABS: 1.2 10*3/uL — AB (ref 0.1–1.0)
MONO ABS: 1.7 10*3/uL — AB (ref 0.1–1.0)
MONOS PCT: 9 %
MONOS PCT: 9 %
MONOS PCT: 12 %
Monocytes Absolute: 1.2 10*3/uL — ABNORMAL HIGH (ref 0.1–1.0)
Monocytes Absolute: 1.9 10*3/uL — ABNORMAL HIGH (ref 0.1–1.0)
Monocytes Relative: 14 %
NEUTROS ABS: 8.7 10*3/uL — AB (ref 1.7–7.7)
NEUTROS ABS: 9 10*3/uL — AB (ref 1.7–7.7)
NEUTROS ABS: 7.9 10*3/uL — AB (ref 1.7–7.7)
NEUTROS ABS: 11.1 10*3/uL — AB (ref 1.7–7.7)
NEUTROS PCT: 69 %
NEUTROS PCT: 65 %
NEUTROS PCT: 72 %
Neutrophils Relative %: 67 %
PLATELETS: 217 10*3/uL (ref 150–400)
Platelets: 221 10*3/uL (ref 150–400)
Platelets: 223 10*3/uL (ref 150–400)
Platelets: 258 10*3/uL (ref 150–400)
RBC: 3.77 MIL/uL — ABNORMAL LOW (ref 4.22–5.81)
RBC: 3.96 MIL/uL — AB (ref 4.22–5.81)
RBC: 3.99 MIL/uL — AB (ref 4.22–5.81)
RBC: 4.17 MIL/uL — ABNORMAL LOW (ref 4.22–5.81)
RDW: 13.9 % (ref 11.5–15.5)
RDW: 13.7 % (ref 11.5–15.5)
RDW: 13.7 % (ref 11.5–15.5)
RDW: 13.7 % (ref 11.5–15.5)
WBC: 12.9 10*3/uL — ABNORMAL HIGH (ref 4.0–10.5)
WBC: 13 10*3/uL — AB (ref 4.0–10.5)
WBC: 12.2 10*3/uL — AB (ref 4.0–10.5)
WBC: 15.4 10*3/uL — ABNORMAL HIGH (ref 4.0–10.5)

## 2016-04-30 LAB — COMPREHENSIVE METABOLIC PANEL
ALBUMIN: 3 g/dL — AB (ref 3.5–5.0)
ALK PHOS: 127 U/L — AB (ref 38–126)
ALT: 24 U/L (ref 17–63)
ANION GAP: 9 (ref 5–15)
AST: 36 U/L (ref 15–41)
BILIRUBIN TOTAL: 0.6 mg/dL (ref 0.3–1.2)
BUN: 14 mg/dL (ref 6–20)
CALCIUM: 8.8 mg/dL — AB (ref 8.9–10.3)
CO2: 25 mmol/L (ref 22–32)
Chloride: 107 mmol/L (ref 101–111)
Creatinine, Ser: 0.89 mg/dL (ref 0.61–1.24)
GFR calc non Af Amer: >60 mL/min (ref >60)
GLUCOSE: 102 mg/dL — AB (ref 65–99)
POTASSIUM: 3.7 mmol/L (ref 3.5–5.1)
Sodium: 141 mmol/L (ref 135–145)
TOTAL PROTEIN: 5.7 g/dL — AB (ref 6.5–8.1)

## 2016-04-30 LAB — TROPONIN I
TROPONIN I: 4.2 ng/mL — AB (ref <0.03)
TROPONIN I: 3.18 ng/mL — AB (ref <0.03)
TROPONIN I: 3.14 ng/mL — AB (ref <0.03)
Troponin I: 5.27 ng/mL (ref <0.03)

## 2016-04-30 MED ORDER — DIAZEPAM 2 MG PO TABS
2.0000 mg | ORAL_TABLET | Freq: Once | ORAL | Status: AC
Start: 2016-04-30 — End: 2016-04-30
  Administered 2016-04-30: 2 mg via ORAL
  Filled 2016-04-30: qty 1

## 2016-04-30 NOTE — Progress Notes (Signed)
PATIENT ID: Joshua [name] is an 71M with hypertension, hyperlipidemia, seizure disorder and prostate cancer her with STEMI complicated by long first degree heart block and Mobitz II.   SUBJECTIVE:  Feels well.  Denies chest pain or shortness of breath.     PHYSICAL EXAM Vitals:   04/30/16 0400 04/30/16 0500 04/30/16 0600 04/30/16 0804  BP: (!) 142/57 134/67 (!) 156/79 127/66  Pulse: 65 70 (!) 55 64  Resp: 12 (!) 25 19 (!) 23  Temp:    98.8 F (37.1 C)  TempSrc:    Oral  SpO2: 98% 98% 98% 98%  Weight:      Height:       General:  Well-appearing.  No acute distress Neck: No JVD Lungs:  CTAB. No crackles, rhonchi or wheezes Heart:  RRR.  No m/r/g.  Normal S1/S2 Abdomen:  Soft, NT, ND.  +BS.  Ecchymosis of L lower abdomen Extremities:  WWP.  No edema.  Temp wire in R groin  LABS: Lab Results  Component Value Date   TROPONINI 4.20 (HH) 04/30/2016   Results for orders placed or performed during the hospital encounter of 04/27/16 (from the past 24 hour(s))  Troponin I (serum)     Status: Abnormal   Collection Time: 04/29/16  3:29 PM  Result Value Ref Range   Troponin I 5.47 (HH) <0.03 ng/mL  CBC with Differential/Platelet     Status: Abnormal   Collection Time: 04/29/16  3:29 PM  Result Value Ref Range   WBC 9.5 4.0 - 10.5 K/uL   RBC 3.78 (L) 4.22 - 5.81 MIL/uL   Hemoglobin 11.3 (L) 13.0 - 17.0 g/dL   HCT 34.5 (L) 39.0 - 52.0 %   MCV 91.3 78.0 - 100.0 fL   MCH 29.9 26.0 - 34.0 pg   MCHC 32.8 30.0 - 36.0 g/dL   RDW 14.1 11.5 - 15.5 %   Platelets 207 150 - 400 K/uL   Neutrophils Relative % 66 %   Neutro Abs 6.2 1.7 - 7.7 K/uL   Lymphocytes Relative 20 %   Lymphs Abs 1.9 0.7 - 4.0 K/uL   Monocytes Relative 10 %   Monocytes Absolute 1.0 0.1 - 1.0 K/uL   Eosinophils Relative 4 %   Eosinophils Absolute 0.4 0.0 - 0.7 K/uL   Basophils Relative 0 %   Basophils Absolute 0.0 0.0 - 0.1 K/uL  Troponin I (serum)     Status: Abnormal   Collection Time: 04/29/16  8:06 PM  Result  Value Ref Range   Troponin I 5.40 (HH) <0.03 ng/mL  CBC with Differential/Platelet     Status: Abnormal   Collection Time: 04/29/16  8:06 PM  Result Value Ref Range   WBC 11.4 (H) 4.0 - 10.5 K/uL   RBC 3.88 (L) 4.22 - 5.81 MIL/uL   Hemoglobin 11.5 (L) 13.0 - 17.0 g/dL   HCT 35.6 (L) 39.0 - 52.0 %   MCV 91.8 78.0 - 100.0 fL   MCH 29.6 26.0 - 34.0 pg   MCHC 32.3 30.0 - 36.0 g/dL   RDW 14.0 11.5 - 15.5 %   Platelets 213 150 - 400 K/uL   Neutrophils Relative % 68 %   Neutro Abs 7.7 1.7 - 7.7 K/uL   Lymphocytes Relative 18 %   Lymphs Abs 2.1 0.7 - 4.0 K/uL   Monocytes Relative 10 %   Monocytes Absolute 1.2 (H) 0.1 - 1.0 K/uL   Eosinophils Relative 4 %   Eosinophils Absolute 0.5 0.0 - 0.7  K/uL   Basophils Relative 0 %   Basophils Absolute 0.1 0.0 - 0.1 K/uL  Troponin I (serum)     Status: Abnormal   Collection Time: 04/30/16  2:15 AM  Result Value Ref Range   Troponin I 5.27 (HH) <0.03 ng/mL  CBC with Differential/Platelet     Status: Abnormal   Collection Time: 04/30/16  2:15 AM  Result Value Ref Range   WBC 12.9 (H) 4.0 - 10.5 K/uL   RBC 3.77 (L) 4.22 - 5.81 MIL/uL   Hemoglobin 11.5 (L) 13.0 - 17.0 g/dL   HCT 33.6 (L) 39.0 - 52.0 %   MCV 89.1 78.0 - 100.0 fL   MCH 30.5 26.0 - 34.0 pg   MCHC 34.2 30.0 - 36.0 g/dL   RDW 13.9 11.5 - 15.5 %   Platelets 221 150 - 400 K/uL   Neutrophils Relative % 67 %   Neutro Abs 8.7 (H) 1.7 - 7.7 K/uL   Lymphocytes Relative 19 %   Lymphs Abs 2.5 0.7 - 4.0 K/uL   Monocytes Relative 9 %   Monocytes Absolute 1.2 (H) 0.1 - 1.0 K/uL   Eosinophils Relative 4 %   Eosinophils Absolute 0.5 0.0 - 0.7 K/uL   Basophils Relative 0 %   Basophils Absolute 0.0 0.0 - 0.1 K/uL  Comprehensive metabolic panel     Status: Abnormal   Collection Time: 04/30/16  2:15 AM  Result Value Ref Range   Sodium 141 135 - 145 mmol/L   Potassium 3.7 3.5 - 5.1 mmol/L   Chloride 107 101 - 111 mmol/L   CO2 25 22 - 32 mmol/L   Glucose, Bld 102 (H) 65 - 99 mg/dL   BUN 14  6 - 20 mg/dL   Creatinine, Ser 0.89 0.61 - 1.24 mg/dL   Calcium 8.8 (L) 8.9 - 10.3 mg/dL   Total Protein 5.7 (L) 6.5 - 8.1 g/dL   Albumin 3.0 (L) 3.5 - 5.0 g/dL   AST 36 15 - 41 U/L   ALT 24 17 - 63 U/L   Alkaline Phosphatase 127 (H) 38 - 126 U/L   Total Bilirubin 0.6 0.3 - 1.2 mg/dL   GFR calc non Af Amer >60 >60 mL/min   GFR calc Af Amer >60 >60 mL/min   Anion gap 9 5 - 15  Troponin I (serum)     Status: Abnormal   Collection Time: 04/30/16  9:24 AM  Result Value Ref Range   Troponin I 4.20 (HH) <0.03 ng/mL  CBC with Differential/Platelet     Status: Abnormal   Collection Time: 04/30/16  9:24 AM  Result Value Ref Range   WBC 13.0 (H) 4.0 - 10.5 K/uL   RBC 3.96 (L) 4.22 - 5.81 MIL/uL   Hemoglobin 11.7 (L) 13.0 - 17.0 g/dL   HCT 35.4 (L) 39.0 - 52.0 %   MCV 89.4 78.0 - 100.0 fL   MCH 29.5 26.0 - 34.0 pg   MCHC 33.1 30.0 - 36.0 g/dL   RDW 13.7 11.5 - 15.5 %   Platelets 223 150 - 400 K/uL   Neutrophils Relative % 69 %   Neutro Abs 9.0 (H) 1.7 - 7.7 K/uL   Lymphocytes Relative 18 %   Lymphs Abs 2.3 0.7 - 4.0 K/uL   Monocytes Relative 9 %   Monocytes Absolute 1.2 (H) 0.1 - 1.0 K/uL   Eosinophils Relative 4 %   Eosinophils Absolute 0.5 0.0 - 0.7 K/uL   Basophils Relative 0 %   Basophils Absolute  0.0 0.0 - 0.1 K/uL    Intake/Output Summary (Last 24 hours) at 04/30/16 1041 Last data filed at 04/30/16 1000  Gross per 24 hour  Intake              480 ml  Output             3400 ml  Net            -2920 ml    Telemetry:  Sinus rhythm with very long PR interval.  Multiple episodes of Mobitz I heart block.  Rates as low as the 30s.  ASSESSMENT AND PLAN:  Principal Problem:   ST elevation (STEMI) myocardial infarction involving other coronary artery of inferior wall (HCC) Active Problems:   Hypercholesterolemia   Essential hypertension   AV block, Mobitz 2   STEMI (ST elevation myocardial infarction) ([place])   Cardiogenic shock (HCC)   1St degree AV block   # Inferior  STEMI: Joshua [name] had an inferior STEMI and underwent successful implantation of a DES to the RCA.  Continue aspirin and clopidogrel.  No beta blocker due to bradycardia and heart block.  # Second degree heart block (2:1): # First degree heart block: Joshua [name] remains asymptomatic.  However, heart rates have been in the 30s due to second degree heart block.  His temp wire was turned down to 30 and he is not pacing.  However his rate at 10 am was 37 bpm.  Continue to monitor for now.  Consider PPM next week if rates remain low.  May pull temp wire tomorrow if he is not requiring pacing.  EP to determine.  # Acute on chronic systolic and diastolic heart failure: LVEF 50% on LV gram.  Joshua [name] diuresed -2.7L yesterday. His renal function is stable.  No beta blocker 2/2 bradycardia.  BP well-controlled.  # Hematuria: Resolved.  H/h stable.  # Ecchymosis: Mr. Ruszkowski has a large bruise on the LLQ.  There is no tenderness or induration.  I suspect that this is due to his heparin injections.  H/h is stable.  We will continue to monitor.     Corneisha Alvi C. Oval Linsey, MD, [facility] 04/30/2016 10:41 AM

## 2016-05-01 ENCOUNTER — Inpatient Hospital Stay (HOSPITAL_COMMUNITY): Payer: Medicare Other

## 2016-05-01 DIAGNOSIS — R509 Fever, unspecified: Secondary | ICD-10-CM

## 2016-05-01 DIAGNOSIS — D72829 Elevated white blood cell count, unspecified: Secondary | ICD-10-CM

## 2016-05-01 LAB — CBC WITH DIFFERENTIAL/PLATELET
BASOS PCT: 0 %
Basophils Absolute: 0 10*3/uL (ref 0.0–0.1)
EOS ABS: 0.1 10*3/uL (ref 0.0–0.7)
Eosinophils Relative: 1 %
HEMATOCRIT: 36.6 % — AB (ref 39.0–52.0)
Hemoglobin: 12.2 g/dL — ABNORMAL LOW (ref 13.0–17.0)
Lymphocytes Relative: 10 %
Lymphs Abs: 1.7 10*3/uL (ref 0.7–4.0)
MCH: 29.4 pg (ref 26.0–34.0)
MCHC: 33.3 g/dL (ref 30.0–36.0)
MCV: 88.2 fL (ref 78.0–100.0)
MONO ABS: 2 10*3/uL — AB (ref 0.1–1.0)
MONOS PCT: 12 %
Neutro Abs: 12.9 10*3/uL — ABNORMAL HIGH (ref 1.7–7.7)
Neutrophils Relative %: 77 %
Platelets: 253 10*3/uL (ref 150–400)
RBC: 4.15 MIL/uL — ABNORMAL LOW (ref 4.22–5.81)
RDW: 13.6 % (ref 11.5–15.5)
WBC: 16.8 10*3/uL — ABNORMAL HIGH (ref 4.0–10.5)

## 2016-05-01 LAB — URINALYSIS, ROUTINE W REFLEX MICROSCOPIC
BILIRUBIN URINE: NEGATIVE
Glucose, UA: NEGATIVE mg/dL
Hgb urine dipstick: NEGATIVE
KETONES UR: NEGATIVE mg/dL
Leukocytes, UA: NEGATIVE
NITRITE: NEGATIVE
PH: 7 (ref 5.0–8.0)
Protein, ur: 30 mg/dL — AB
Specific Gravity, Urine: 1.021 (ref 1.005–1.030)

## 2016-05-01 LAB — URINE MICROSCOPIC-ADD ON
Bacteria, UA: NONE SEEN
Squamous Epithelial / LPF: NONE SEEN

## 2016-05-01 LAB — TROPONIN I: Troponin I: 2.43 ng/mL (ref <0.03)

## 2016-05-01 MED ORDER — MAGNESIUM HYDROXIDE 400 MG/5ML PO SUSP
30.0000 mL | Freq: Every day | ORAL | Status: DC | PRN
  Filled 2016-05-01: qty 30

## 2016-05-01 MED ORDER — ACETAMINOPHEN 325 MG PO TABS
650.0000 mg | ORAL_TABLET | Freq: Once | ORAL | Status: AC
  Administered 2016-05-01: 650 mg via ORAL
  Filled 2016-05-01: qty 2

## 2016-05-01 NOTE — Progress Notes (Addendum)
Right femoral transvenous pacer removed, per order  Pressure held until hemostasis achieved.  Right groin site level 1 (small ecchymosis), pressure dressing applied and bilateral DP pulses +2.  Patient instructed on activity limitations while on bedrest.  Will continue to monitor. [place], Ardeth Sportsman

## 2016-05-01 NOTE — Progress Notes (Addendum)
PATIENT ID: Joshua [name] is an 67M with hypertension, hyperlipidemia, seizure disorder and prostate cancer her with STEMI complicated by long first degree heart block and Mobitz II.   SUBJECTIVE:  Feels well.  Denies chest pain or shortness of breath.  He has not noted any palpitations, lightheadedness or dizziness.   PHYSICAL EXAM Vitals:   05/01/16 0740 05/01/16 0800 05/01/16 0900 05/01/16 1000  BP: 123/64 (!) 147/72 (!) 147/71 (!) 143/62  Pulse: 79 74 79 81  Resp: (!) 30 (!) 28 15 (!) 29  Temp: 100.1 F (37.8 C)     TempSrc: Oral     SpO2: 96% 100% 95% 96%  Weight:      Height:       General:  Well-appearing.  No acute distress Neck: No JVD Lungs:  CTAB. No crackles, rhonchi or wheezes Heart:  RRR.  No m/r/g.  Normal S1/S2 Abdomen:  Soft, NT, ND.  +BS.  Ecchymosis of L lower abdomen Extremities:  WWP.  No edema.  Temp wire in R groin  LABS: Lab Results  Component Value Date   TROPONINI 2.43 ([place]) 05/01/2016   Results for orders placed or performed during the hospital encounter of 04/27/16 (from the past 24 hour(s))  Troponin I (serum)     Status: Abnormal   Collection Time: 04/30/16  2:06 PM  Result Value Ref Range   Troponin I 3.18 (HH) <0.03 ng/mL  CBC with Differential/Platelet     Status: Abnormal   Collection Time: 04/30/16  2:06 PM  Result Value Ref Range   WBC 12.2 (H) 4.0 - 10.5 K/uL   RBC 3.99 (L) 4.22 - 5.81 MIL/uL   Hemoglobin 11.8 (L) 13.0 - 17.0 g/dL   HCT 35.7 (L) 39.0 - 52.0 %   MCV 89.5 78.0 - 100.0 fL   MCH 29.6 26.0 - 34.0 pg   MCHC 33.1 30.0 - 36.0 g/dL   RDW 13.7 11.5 - 15.5 %   Platelets 217 150 - 400 K/uL   Neutrophils Relative % 65 %   Neutro Abs 7.9 (H) 1.7 - 7.7 K/uL   Lymphocytes Relative 17 %   Lymphs Abs 2.1 0.7 - 4.0 K/uL   Monocytes Relative 14 %   Monocytes Absolute 1.7 (H) 0.1 - 1.0 K/uL   Eosinophils Relative 4 %   Eosinophils Absolute 0.5 0.0 - 0.7 K/uL   Basophils Relative 0 %   Basophils Absolute 0.0 0.0 - 0.1 K/uL    Troponin I (serum)     Status: Abnormal   Collection Time: 04/30/16  8:06 PM  Result Value Ref Range   Troponin I 3.14 (HH) <0.03 ng/mL  CBC with Differential/Platelet     Status: Abnormal   Collection Time: 04/30/16  8:06 PM  Result Value Ref Range   WBC 15.4 (H) 4.0 - 10.5 K/uL   RBC 4.17 (L) 4.22 - 5.81 MIL/uL   Hemoglobin 12.3 (L) 13.0 - 17.0 g/dL   HCT 37.2 (L) 39.0 - 52.0 %   MCV 89.2 78.0 - 100.0 fL   MCH 29.5 26.0 - 34.0 pg   MCHC 33.1 30.0 - 36.0 g/dL   RDW 13.7 11.5 - 15.5 %   Platelets 258 150 - 400 K/uL   Neutrophils Relative % 72 %   Neutro Abs 11.1 (H) 1.7 - 7.7 K/uL   Lymphocytes Relative 14 %   Lymphs Abs 2.1 0.7 - 4.0 K/uL   Monocytes Relative 12 %   Monocytes Absolute 1.9 (H) 0.1 - 1.0 K/uL  Eosinophils Relative 2 %   Eosinophils Absolute 0.3 0.0 - 0.7 K/uL   Basophils Relative 0 %   Basophils Absolute 0.0 0.0 - 0.1 K/uL  Troponin I (serum)     Status: Abnormal   Collection Time: 05/01/16  2:06 AM  Result Value Ref Range   Troponin I 2.43 (HH) <0.03 ng/mL  CBC with Differential/Platelet     Status: Abnormal   Collection Time: 05/01/16  2:06 AM  Result Value Ref Range   WBC 16.8 (H) 4.0 - 10.5 K/uL   RBC 4.15 (L) 4.22 - 5.81 MIL/uL   Hemoglobin 12.2 (L) 13.0 - 17.0 g/dL   HCT 36.6 (L) 39.0 - 52.0 %   MCV 88.2 78.0 - 100.0 fL   MCH 29.4 26.0 - 34.0 pg   MCHC 33.3 30.0 - 36.0 g/dL   RDW 13.6 11.5 - 15.5 %   Platelets 253 150 - 400 K/uL   Neutrophils Relative % 77 %   Neutro Abs 12.9 (H) 1.7 - 7.7 K/uL   Lymphocytes Relative 10 %   Lymphs Abs 1.7 0.7 - 4.0 K/uL   Monocytes Relative 12 %   Monocytes Absolute 2.0 (H) 0.1 - 1.0 K/uL   Eosinophils Relative 1 %   Eosinophils Absolute 0.1 0.0 - 0.7 K/uL   Basophils Relative 0 %   Basophils Absolute 0.0 0.0 - 0.1 K/uL    Intake/Output Summary (Last 24 hours) at 05/01/16 1201 Last data filed at 05/01/16 0800  Gross per 24 hour  Intake              950 ml  Output             2475 ml  Net             -1525 ml    Telemetry:  Sinus rhythm with first degree heart block, though shorter than before.  Fewer episodes of second degree heart block.   ASSESSMENT AND PLAN:  Principal Problem:   ST elevation (STEMI) myocardial infarction involving other coronary artery of inferior wall (HCC) Active Problems:   Hypercholesterolemia   Essential hypertension   AV block, Mobitz 2   STEMI (ST elevation myocardial infarction) ([place])   Cardiogenic shock (HCC)   1St degree AV block   # Inferior STEMI: [name] had an inferior STEMI and underwent successful implantation of a DES to the RCA.  Continue aspirin and clopidogrel.  No beta blocker due to bradycardia and heart block.  # Second degree heart block (2:1): # First degree heart block: # SVT [name] remains asymptomatic.  The bradycardia has resolved and he has not required back up pacing.  He continues to have some episodes of second degree heart block.  Will remove the temp wire today and see how he does with ambulation.   # Acute on chronic systolic and diastolic heart failure: LVEF 50% on LV gram.  [name] diuresed -2.1L yesterday. Will repeat BMP.  No beta blocker 2/2 bradycardia.  BP well-controlled.  # Hematuria: Resolved.  H/h stable.  # Ecchymosis: [name] has a large bruise on the LLQ.  There is no tenderness or induration.  I suspect that this is due to his heparin injections.  H/h is stable.  We will continue to monitor.  # Fever: [name] is febrile and has a leukocytosis.  We will check a u/a and CXR.  We will also get blood cultures given that he has a line in his groin.  Petula Rotolo C. Oval Linsey, MD, [facility] 05/01/2016 12:01 PM

## 2016-05-01 NOTE — Progress Notes (Addendum)
Pts bedrest was up and pt wanted to try to have a BM on bedside commode. Pt was on bedside commode when pt went unresponsive and HR dropped from 70s to 30s then had a very long pause. Got pt back into bed, pt became alert and responsive, HR in the 50s - 70s, BP 159/65. Did a stat EKG; no changes from previous EKG. Paged cards fellow and let them know what happened. Will continue to monitor pt and keep pt on bedrest for the night.

## 2016-05-02 ENCOUNTER — Inpatient Hospital Stay (HOSPITAL_COMMUNITY): Payer: Medicare Other

## 2016-05-02 LAB — BASIC METABOLIC PANEL
ANION GAP: 9 (ref 5–15)
BUN: 16 mg/dL (ref 6–20)
CO2: 23 mmol/L (ref 22–32)
Calcium: 8.7 mg/dL — ABNORMAL LOW (ref 8.9–10.3)
Chloride: 104 mmol/L (ref 101–111)
Creatinine, Ser: 0.92 mg/dL (ref 0.61–1.24)
GFR calc Af Amer: >60 mL/min (ref >60)
Glucose, Bld: 118 mg/dL — ABNORMAL HIGH (ref 65–99)
POTASSIUM: 3.5 mmol/L (ref 3.5–5.1)
SODIUM: 136 mmol/L (ref 135–145)

## 2016-05-02 LAB — URINE CULTURE

## 2016-05-02 LAB — CBC
HEMATOCRIT: 37.7 % — AB (ref 39.0–52.0)
Hemoglobin: 12.6 g/dL — ABNORMAL LOW (ref 13.0–17.0)
MCH: 29.8 pg (ref 26.0–34.0)
MCHC: 33.4 g/dL (ref 30.0–36.0)
MCV: 89.1 fL (ref 78.0–100.0)
Platelets: 266 10*3/uL (ref 150–400)
RBC: 4.23 MIL/uL (ref 4.22–5.81)
RDW: 13.9 % (ref 11.5–15.5)
WBC: 21 10*3/uL — AB (ref 4.0–10.5)

## 2016-05-02 MED ORDER — POTASSIUM CHLORIDE CRYS ER 20 MEQ PO TBCR: 20.0000 meq | EXTENDED_RELEASE_TABLET | Freq: Once | ORAL | Status: DC

## 2016-05-02 MED ORDER — FUROSEMIDE 10 MG/ML IJ SOLN
60.0000 mg | Freq: Once | INTRAMUSCULAR | Status: AC
  Administered 2016-05-02: 60 mg via INTRAVENOUS
  Filled 2016-05-02: qty 6

## 2016-05-02 MED ORDER — POLYVINYL ALCOHOL 1.4 % OP SOLN
1.0000 [drp] | Freq: Every evening | OPHTHALMIC | Status: DC | PRN
  Administered 2016-05-03 – 2016-05-04 (×2): 1 [drp] via OPHTHALMIC
  Filled 2016-05-02 (×3): qty 15

## 2016-05-02 MED ORDER — POTASSIUM CHLORIDE CRYS ER 20 MEQ PO TBCR
40.0000 meq | EXTENDED_RELEASE_TABLET | Freq: Once | ORAL | Status: AC
  Administered 2016-05-02: 40 meq via ORAL
  Filled 2016-05-02: qty 2

## 2016-05-02 NOTE — Progress Notes (Signed)
Strips from syncopal episode reviewed this morning. There is concomitant sinus slowing and AV block consistent with hyper vagotonia.  At this point would continue to observe and not proceed with pacing

## 2016-05-02 NOTE — Progress Notes (Signed)
SUBJECTIVE: The patient is doing well today.  At this time, he denies chest pain, shortness of breath, or any new concerns.  No recurrent syncope, no dizzy spells.   Marland Kitchen antiseptic oral rinse  7 mL Mouth Rinse BID  . aspirin  81 mg Oral Daily  . atorvastatin  80 mg Oral q1800  . clopidogrel  75 mg Oral Daily  . heparin  5,000 Units Subcutaneous Q8H  . phenobarbital  64.8 mg Oral q morning - 10a   And  . phenobarbital  97.2 mg Oral QPM  . phenytoin  100 mg Oral BID  . sodium chloride flush  3 mL Intravenous Q12H   . sodium chloride Stopped (05/01/16 1400)    OBJECTIVE: Physical Exam: Vitals:   05/02/16 0900 05/02/16 1000 05/02/16 1100 05/02/16 1200  BP: 140/71 (!) 141/74 (!) 148/72   Pulse: 88 89 88 95  Resp: (!) 23 19 20  (!) 27  Temp:    98 F (36.7 C)  TempSrc:    Oral  SpO2: 95% 94% 96% 97%  Weight:      Height:        Intake/Output Summary (Last 24 hours) at 05/02/16 1254 Last data filed at 05/02/16 1100  Gross per 24 hour  Intake              140 ml  Output             2100 ml  Net            -1960 ml    Telemetry reveals SR, variable 1st degree AVblock, Mobitz type one, last PM had prolonged V pause of 26seconds associated with syncope, preceded by P-P prolongation and 2:1 block  GEN- The patient is well appearing, alert and oriented x 3 today.   Head- normocephalic, atraumatic Eyes-  Sclera clear, conjunctiva pink Ears- hearing intact Oropharynx- clear Neck- supple, no JVP Lungs- Clear to ausculation bilaterally, normal work of breathing Heart- Regular rate and rhythm, no significant murmurs, no rubs or gallops GI- soft, NT, ND Extremities- no clubbing, cyanosis, or edema, R groin dressing is dry, non-tender Skin- no rash or lesion Psych- euthymic mood, full affect Neuro- no gross deficits appreciated  LABS: Basic Metabolic Panel:  Recent Labs  04/30/16 0215 05/02/16 0346  NA 141 136  K 3.7 3.5  CL 107 104  CO2 25 23  GLUCOSE 102* 118*    BUN 14 16  CREATININE 0.89 0.92  CALCIUM 8.8* 8.7*   Liver Function Tests:  Recent Labs  04/30/16 0215  AST 36  ALT 24  ALKPHOS 127*  BILITOT 0.6  PROT 5.7*  ALBUMIN 3.0*   No results for input(s): LIPASE, AMYLASE in the last 72 hours. CBC:  Recent Labs  04/30/16 2006 05/01/16 0206 05/02/16 0929  WBC 15.4* 16.8* 21.0*  NEUTROABS 11.1* 12.9*  --   HGB 12.3* 12.2* 12.6*  HCT 37.2* 36.6* 37.7*  MCV 89.2 88.2 89.1  PLT 258 253 266   Cardiac Enzymes:  Recent Labs  04/30/16 1406 04/30/16 2006 05/01/16 0206  TROPONINI 3.18* 3.14* 2.43*    RADIOLOGY: Dg Chest Port 1 View Result Date: 05/01/2016 CLINICAL DATA:  82 year old male with fever. Initial encounter. EXAM: PORTABLE CHEST 1 VIEW COMPARISON:  04/02/2015 and earlier. FINDINGS: Portable AP semi upright view at 1455 hours. Stable cardiomegaly and mediastinal contours. Chronic but increased appearing widespread pulmonary interstitial opacity. No pneumothorax, pleural effusion or confluent pulmonary opacity. Calcified aortic atherosclerosis. Visualized tracheal air column  is within normal limits. IMPRESSION: Acute on chronic increased interstitial opacity in both lungs. Favor viral/atypical respiratory infection in this setting. Main differential consideration is pulmonary interstitial edema. Electronically Signed   By: Genevie Ann M.D.   On: 05/01/2016 15:15    ASSESSMENT AND PLAN:   1. S/p inferior STEMI, PCI 04/26/16     BP stable     On ASA/plavix  2. 1AVB profound - noted on preop ECG 3.  Persistent AVBlock, wenckebach today, temp wire removed yesterday      Currently SR, normal PR interval, HR 80's      C/w variable PR interval and episodes of Mobitz I block without significant bradycardia noted until last PM      19:59 developed 2:1 block followed by pause of 26seconds, preceded by P-P prolongation, this was associated with getting up and over to the bedside cammode to have a BM became symptomatic with syncope and  returned to bed      likely vagally mediated.      Continue to monitor telemetry closely  4. Leukocytosis     WBC today 21.0     Afebrile     Temp wire site soft, non-tender.       Tommye Standard, PA-C 05/02/2016 12:54 PM

## 2016-05-02 NOTE — Progress Notes (Signed)
Subjective: Pt comfortable  No CP  No SOB   Objective: Vitals:   05/02/16 0500 05/02/16 0600 05/02/16 0700 05/02/16 0752  BP: (!) 148/62 (!) 148/62 (!) 144/63   Pulse: 81 77 82   Resp: 17 (!) 28 (!) 25   Temp:    100.2 F (37.9 C)  TempSrc:    Oral  SpO2: 94% 94% 95%   Weight:      Height:       Weight change:   Intake/Output Summary (Last 24 hours) at 05/02/16 0758 Last data filed at 05/02/16 0730  Gross per 24 hour  Intake              193 ml  Output             1100 ml  Net             -907 ml    General: Alert, awake, oriented x3, in no acute distress Neck:  JVP is normal Heart: Regular rate and rhythm, without murmurs, rubs, gallops.  Lungs: Rhonchi mild   Exemities:  No edema.   Neuro: Grossly intact, nonfocal.  Tele:  SR, Type I 2Degree AV block  One long pause greater than 9 sec at 20:00 (pt sitting on commode, no strain)  Lab Results: Results for orders placed or performed during the hospital encounter of 04/27/16 (from the past 24 hour(s))  Urinalysis, Routine w reflex microscopic (not at [facility])     Status: Abnormal   Collection Time: 05/01/16  1:02 PM  Result Value Ref Range   Color, Urine AMBER (A) YELLOW   APPearance CLEAR CLEAR   Specific Gravity, Urine 1.021 1.005 - 1.030   pH 7.0 5.0 - 8.0   Glucose, UA NEGATIVE NEGATIVE mg/dL   Hgb urine dipstick NEGATIVE NEGATIVE   Bilirubin Urine NEGATIVE NEGATIVE   Ketones, ur NEGATIVE NEGATIVE mg/dL   Protein, ur 30 (A) NEGATIVE mg/dL   Nitrite NEGATIVE NEGATIVE   Leukocytes, UA NEGATIVE NEGATIVE  Urine microscopic-add on     Status: None   Collection Time: 05/01/16  1:02 PM  Result Value Ref Range   Squamous Epithelial / LPF NONE SEEN NONE SEEN   WBC, UA 0-5 0 - 5 WBC/hpf   RBC / HPF 0-5 0 - 5 RBC/hpf   Bacteria, UA NONE SEEN NONE SEEN  Basic metabolic panel     Status: Abnormal   Collection Time: 05/02/16  3:46 AM  Result Value Ref Range   Sodium 136 135 - 145 mmol/L   Potassium 3.5 3.5 - 5.1  mmol/L   Chloride 104 101 - 111 mmol/L   CO2 23 22 - 32 mmol/L   Glucose, Bld 118 (H) 65 - 99 mg/dL   BUN 16 6 - 20 mg/dL   Creatinine, Ser 0.92 0.61 - 1.24 mg/dL   Calcium 8.7 (L) 8.9 - 10.3 mg/dL   GFR calc non Af Amer >60 >60 mL/min   GFR calc Af Amer >60 >60 mL/min   Anion gap 9 5 - 15    Studies/Results: Dg Chest Port 1 View  Result Date: 05/01/2016 CLINICAL DATA:  82 year old male with fever. Initial encounter. EXAM: PORTABLE CHEST 1 VIEW COMPARISON:  04/02/2015 and earlier. FINDINGS: Portable AP semi upright view at 1455 hours. Stable cardiomegaly and mediastinal contours. Chronic but increased appearing widespread pulmonary interstitial opacity. No pneumothorax, pleural effusion or confluent pulmonary opacity. Calcified aortic atherosclerosis. Visualized tracheal air column is within normal limits. IMPRESSION: Acute on chronic increased  interstitial opacity in both lungs. Favor viral/atypical respiratory infection in this setting. Main differential consideration is pulmonary interstitial edema. Electronically Signed   By: Genevie Ann M.D.   On: 05/01/2016 15:15    Medications:REviewed     @PROBHOSP @  1  STEMI  S/p IWMI with DES to RCA  Pt with 90% ostial LCXx  (conservative RX for now)   ASA and b blocker due to bradycardia and HB   Will give lasix x 1  Replete K  Follow    2  Rhythm  S/P temp pacer  Episode of syncope last night on commode  Long pause  EP seeing  WIll eval    3  HTN  BP is fair  4  Fever  Low grade  Will check CBC  No obvious sites of infection  Has condom cath    5  URO  REcent intervention    LOS: 5 days   Dorris Carnes 05/02/2016, 7:58 AM

## 2016-05-02 NOTE — Significant Event (Signed)
Confirmed with cardiology Dr. Maryln Gottron and EP team [facility] regarding mobility. Per both teams, patient can be bedrest today and sit up in bed. Patient given incentive spirometry and is reposition/turn as needed. Bed in chair position at times.

## 2016-05-02 NOTE — Progress Notes (Addendum)
CARDIAC REHAB PHASE I   Ambulation on hold per RN. Began MI/stent education with pt and pt's daughter at bedside.  Reviewed risk factors, MI book, anti-platelet therapy, stent card, activity restrictions, ntg, heart healthy diet, and phase 2 cardiac rehab. Pt verbalized understanding, receptive to education. Pt agrees to phase 2 cardiac rehab referral, will send to [facility] per pt request. Pt in bed, call bell within reach. Will follow up tomorrow for possible ambulation and to continue/reinfoce education.  [id number] Lenna Sciara, RN, BSN 05/02/2016 10:47 AM

## 2016-05-03 LAB — CBC
HCT: 34.3 % — ABNORMAL LOW (ref 39.0–52.0)
HEMOGLOBIN: 11.5 g/dL — AB (ref 13.0–17.0)
MCH: 29.9 pg (ref 26.0–34.0)
MCHC: 33.5 g/dL (ref 30.0–36.0)
MCV: 89.3 fL (ref 78.0–100.0)
Platelets: 350 10*3/uL (ref 150–400)
RBC: 3.84 MIL/uL — ABNORMAL LOW (ref 4.22–5.81)
RDW: 13.9 % (ref 11.5–15.5)
WBC: 16.8 10*3/uL — ABNORMAL HIGH (ref 4.0–10.5)

## 2016-05-03 LAB — URINALYSIS, ROUTINE W REFLEX MICROSCOPIC
Glucose, UA: NEGATIVE mg/dL
HGB URINE DIPSTICK: NEGATIVE
KETONES UR: NEGATIVE mg/dL
Leukocytes, UA: NEGATIVE
NITRITE: NEGATIVE
PH: 5.5 (ref 5.0–8.0)
Protein, ur: 100 mg/dL — AB
SPECIFIC GRAVITY, URINE: 1.029 (ref 1.005–1.030)

## 2016-05-03 LAB — BASIC METABOLIC PANEL
ANION GAP: 9 (ref 5–15)
BUN: 24 mg/dL — AB (ref 6–20)
CALCIUM: 8.7 mg/dL — AB (ref 8.9–10.3)
CO2: 23 mmol/L (ref 22–32)
Chloride: 103 mmol/L (ref 101–111)
Creatinine, Ser: 1 mg/dL (ref 0.61–1.24)
GFR calc Af Amer: >60 mL/min (ref >60)
GLUCOSE: 132 mg/dL — AB (ref 65–99)
Potassium: 3.5 mmol/L (ref 3.5–5.1)
Sodium: 135 mmol/L (ref 135–145)

## 2016-05-03 LAB — URINE MICROSCOPIC-ADD ON

## 2016-05-03 MED ORDER — ATROPINE SULFATE 1 MG/10ML IJ SOSY
PREFILLED_SYRINGE | INTRAMUSCULAR | Status: AC
  Filled 2016-05-03: qty 10

## 2016-05-03 MED ORDER — SODIUM CHLORIDE 0.9 % IV BOLUS (SEPSIS)
500.0000 mL | Freq: Once | INTRAVENOUS | Status: AC
  Administered 2016-05-03: 500 mL via INTRAVENOUS

## 2016-05-03 NOTE — Evaluation (Signed)
Clinical/Bedside Swallow Evaluation Patient Details  Name: Joshua [name] MRN: [id number] Date of Birth: 1932/02/21  Today's Date: 05/03/2016 Time: SLP Start Time (ACUTE ONLY): 54 SLP Stop Time (ACUTE ONLY): 1537 SLP Time Calculation (min) (ACUTE ONLY): 27 min  Past Medical History:  Past Medical History:  Diagnosis Date  . Allergy    pcn  . Arteriovenous malformation    ([place])  . Blood transfusion    right eye  no surgery as yet  . ED (erectile dysfunction)   . GERD (gastroesophageal reflux disease)   . Hypercholesterolemia   . Hypertension   . Prostate cancer ([place]) 11/02/11 bxs   Adenocarcinoma,Gleason=3+3=6 & 3+4=7,PSA+1.58 Volume=19cc  . Seizures ([place])    Past Surgical History:  Past Surgical History:  Procedure Laterality Date  . APPENDECTOMY    . CARDIAC CATHETERIZATION N/A 04/27/2016   Procedure: Left Heart Cath and Coronary Angiography;  Surgeon: Belva Crome, MD;  Location: [place] [facility];  Service: Cardiovascular;  Laterality: N/A;  . CARDIAC CATHETERIZATION N/A 04/27/2016   Procedure: Temporary Pacemaker;  Surgeon: Belva Crome, MD;  Location: [place] [facility];  Service: Cardiovascular;  Laterality: N/A;  . CARDIAC CATHETERIZATION N/A 04/27/2016   Procedure: Coronary Stent Intervention;  Surgeon: Belva Crome, MD;  Location: [place] [facility];  Service: Cardiovascular;  Laterality: N/A;  Proximal RCA Mid RCA  . CRANIOTOMY  1980  . CYSTOSCOPY  01/19/2012   Procedure: CYSTOSCOPY;  Surgeon: Malka So, MD;  Location[facility];  Service: Urology;;  no seeds found in bladder  . CYSTOSCOPY W/ RETROGRADES Bilateral 04/19/2016   Procedure: CYSTOSCOPY WITH BILATERAL RETROGRADE PYELOGRAM TRANSURETHRAL RESECTION OF BLADDER TUMOR ;  Surgeon: [name], MD;  Location: [facility];  Service: Urology;  Laterality: Bilateral;  . RADIOACTIVE SEED IMPLANT  01/19/2012   Procedure: RADIOACTIVE SEED IMPLANT;  Surgeon: Malka So, MD;  Location[facility];  Service: Urology;  Laterality: N/A;  55 seeds implanted   HPI:  82 y/o gentleman with PMG significant for seizure disorder, AVM, HTN, GERD. Presented as code STEMI. Swallow assessment ordered secondary to concern for possible aspiration with temp spikes and increased WBC.    Assessment / Plan / Recommendation Clinical Impression  Pt referred for clinical assessment of swallow function secondary to concern for aspiration. Pt demo'd intermittent immediate and delayed cough with thin liquids. Pt reports he believes the cough is secondary to incentive spirometer use. Vocal quality remained clear. Given intermittent cough with PO and temp spikes/increased WBC, recommend MBSS to r/o aspiration. Pt is agreeable to proceeding with MBSS tomorrow. Continue with current diet at present time with attention to upright positioning and careful PO intake.     Aspiration Risk  Mild aspiration risk    Diet Recommendation Regular;Thin liquid   Liquid Administration via: Cup;Straw Medication Administration: Whole meds with liquid Supervision: Intermittent supervision to cue for compensatory strategies Compensations: Slow rate;Small sips/bites Postural Changes: Seated upright at 90 degrees    Other  Recommendations Oral Care Recommendations: Oral care QID   Follow up Recommendations       Frequency and Duration            Prognosis Prognosis for Safe Diet Advancement: Good      Swallow Study   General Date of Onset: 05/03/16 HPI: 82 y/o gentleman with PMG significant for seizure disorder, AVM, HTN, GERD. Presented as code STEMI. Swallow assessment ordered secondary to concern for possible aspiration with temp spikes  and increased WBC.  Type of Study: Bedside Swallow Evaluation Previous Swallow Assessment: none known Diet Prior to this Study: Regular;Thin liquids Temperature Spikes Noted: Yes Respiratory Status: Room air History of Recent Intubation: No Behavior/Cognition:  Alert;Cooperative;Pleasant mood Oral Cavity Assessment: Within Functional Limits Oral Care Completed by SLP: No Oral Cavity - Dentition: Adequate natural dentition Vision: Functional for self-feeding Self-Feeding Abilities: Able to feed self Patient Positioning: Upright in bed Baseline Vocal Quality: Normal Volitional Cough: Strong Volitional Swallow: Able to elicit    Oral/Motor/Sensory Function Overall Oral Motor/Sensory Function: Within functional limits (mild L-sided facial droop, present for 25+ yrs per pt)   Ice Chips Ice chips: Within functional limits Presentation: Spoon   Thin Liquid Thin Liquid: Impaired Pharyngeal  Phase Impairments: Multiple swallows;Cough - Delayed;Cough - Immediate (2-3 swallows per bolus)    Nectar Thick     Honey Thick     Puree     Solid   GO   Solid: Within functional limits Presentation: [place]  MA, Stanley Pager [phone] 05/03/2016,3:54 PM

## 2016-05-03 NOTE — Progress Notes (Addendum)
CARDIAC REHAB PHASE I   PRE:  Rate/Rhythm: 84 heart block ?2nd degree   BP:  Sitting: 133/82        SaO2: 100 RA  MODE:  Ambulation: 0 ft, Stood with assist x2   POST:  Rate/Rhythm: 92 heart block 1st degree, several small pauses, gradual rate decreased to 60, returned to bed, rate increased to 80s HB 1st degree  BP:  Sitting: 97/59, 117/70-5 minutes later         SaO2: 95 RA  Pt has been in bed for one week, very weak. Pt states he is fearful to get out of bed, states he is afraid he will become unresponsive again. Pt required assist x2 to transition from lying to sitting position. Pt sat on edge of bed for 2-3 minutes, c/o of L shoulder pain, unable to raise left arm independently, however did not complain when arm raised with assistance. Pt c/o of feeling "uncomfortable" and "afraid," no specific complaints. Pt stood with x2 assist, gait belt, weak, unsteady. Pt held to walker, had difficulty moving R leg. Pt states this is not new, but it has worsened with recent immobility. After 2-3 minutes of standing, pt began to have multiple brief pauses, heart rate decreased to 60 (from 80s-90s), pt became less verbal, leaning to the right, slumped, pale, diaphoretic, unable to verbalize any symptoms. Pt immediately returned to bed, RN to bedside. Pt BP 97/59. Pt assisted to lying position, unable to verbalize his symptoms, but states he was "starting to feel better." BP improved with recheck. Pt will benefit from PT as he is very deconditioned and lives alone, demonstrated poor activity tolerance today. Pt in bed, call bell within reach. Will continue to follow as x2.   [id number] Lenna Sciara, RN, BSN 05/03/2016 10:53 AM

## 2016-05-03 NOTE — Evaluation (Signed)
Physical Therapy Evaluation Patient Details Name: Joshua [name] MRN: [id number] DOB: 05-Jan-1932 Today's Date: 05/03/2016   History of Present Illness  82 y.o. male admitted to [facility] on 04/27/16 for N/V, weakness, brady cardia with second degree heart block.  Found to have a STEMI.  Pt s/p PCI/DES to RCA on 04/27/16.  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    Clinical Impression  Pt is very weak and deconditioned.  He lives at home alone and has a h/o multiple frequent falls PTA.  He would benefit from post acute rehab to increase his independence after a long hospitalization.   PT to follow acutely for deficits listed below.       Follow Up Recommendations SNF    Equipment Recommendations  Rolling walker with 5" wheels    Recommendations for Other Services   NA    Precautions / Restrictions Precautions Precautions: Fall Precaution Comments: pt very weak      Mobility  Bed Mobility Overal bed mobility: Needs Assistance Bed Mobility: Supine to Sit;Sit to Supine     Supine to sit: Mod assist;HOB elevated Sit to supine: Mod assist   General bed mobility comments: Mod assist to support trunk to get to sitting, mod assist to help lift both legs back into bed to get back to supine.   Transfers                 General transfer comment: NT, pt had pauses and decreased level of arousal earlier with caridac rehab in standing.  Worked from EOB today to gently ease hinm into mobility.           Balance Overall balance assessment: Needs assistance Sitting-balance support: Feet supported;Bilateral upper extremity supported Sitting balance-Leahy Scale: Poor Sitting balance - Comments: right lateral lean in sitting, min to mod assist as pt fatigued to maintain sitting balance.  Postural control: Right lateral lean                                   Pertinent Vitals/Pain Pain Assessment: No/denies pain    Home Living  Family/patient expects to be discharged to:: Private residence Living Arrangements: Alone Available Help at Discharge: Family;Available PRN/intermittently Type of Home: House Home Access: Stairs to enter Entrance Stairs-Rails: Left Entrance Stairs-Number of Steps: 1 Home Layout: Two level;Bed/bath upstairs Home Equipment: None Additional Comments: per daughter he should use a RW/cane, but he refuses, too proud    Prior Function Level of Independence: Independent         Comments: with significant falls, has a cleaning lady        Extremity/Trunk Assessment   Upper Extremity Assessment: LUE deficits/detail       LUE Deficits / Details: left arm with difficulty with overhead motion or any lifting against gravity.  He does better with AAROM to left shoulder, but still grimaces with movement (this is new since being in bed for a week).    Lower Extremity Assessment: RLE deficits/detail;LLE deficits/detail RLE Deficits / Details: bil legs generally weak and stiff.  Right is traditionally weaker, however, today left is more stiff and weaker.  Grossly 3/5 ankle, knee, 3-/5 hip LLE Deficits / Details: More limited than right due to L ankle and L hip pain with mobility.  ankle at least 3-/5, knee 3-/5, hip 3-/5  Cervical / Trunk Assessment: Kyphotic  Communication   Communication: No  difficulties  Cognition Arousal/Alertness: Awake/alert Behavior During Therapy: WFL for tasks assessed/performed Overall Cognitive Status: Within Functional Limits for tasks assessed                      General Comments General comments (skin integrity, edema, etc.): Vitals stable throughout, no reports of lightheadedness in sitting.  No pauses noted during my session on the monitor.     Exercises General Exercises - Lower Extremity Ankle Circles/Pumps: AAROM;Both;20 reps Long Arc Quad: AROM;Both;5 reps;Seated Heel Slides: AAROM;Both;5 reps;Supine Hip Flexion/Marching: AROM;Both;10  reps;Seated Toe Raises: AROM;Both;10 reps;Seated      Assessment/Plan    PT Assessment Patient needs continued PT services  PT Diagnosis Difficulty walking;Abnormality of gait;Generalized weakness;Acute pain   PT Problem List Decreased strength;Decreased activity tolerance;Decreased range of motion;Decreased balance;Decreased mobility;Decreased knowledge of use of DME;Cardiopulmonary status limiting activity;Pain  PT Treatment Interventions DME instruction;Gait training;Stair training;Functional mobility training;Therapeutic activities;Therapeutic exercise;Balance training;Neuromuscular re-education;Patient/family education;Manual techniques   PT Goals (Current goals can be found in the Care Plan section) Acute Rehab PT Goals Patient Stated Goal: to get stronger so he can go home PT Goal Formulation: With patient Time For Goal Achievement: 05/17/16 Potential to Achieve Goals: Good    Frequency Min 3X/week   Barriers to discharge Decreased caregiver support pt lives alone       End of Session   Activity Tolerance: Patient limited by fatigue Patient left: in bed;with call bell/phone within reach;with family/visitor present Nurse Communication: Mobility status         Time: 1332-1406 PT Time Calculation (min) (ACUTE ONLY): 34 min   Charges:   PT Evaluation $PT Eval Moderate Complexity: 1 Procedure PT Treatments $Therapeutic Activity: 8-22 mins        Brittany Osier B. [place], [place], DPT [phone]   05/03/2016, 5:10 PM

## 2016-05-03 NOTE — Progress Notes (Signed)
Subjective: No CP  Breathing is OK   Objective: Vitals:   05/03/16 0500 05/03/16 0600 05/03/16 0700 05/03/16 0800  BP: 117/68 127/66 (!) 142/75 134/75  Pulse: 88 87 80 80  Resp: (!) 24 16 13 15   Temp:   98.1 F (36.7 C)   TempSrc:   Oral   SpO2: 91% 92% 91% 93%  Weight:      Height:       Weight change:   Intake/Output Summary (Last 24 hours) at 05/03/16 [id number] Last data filed at 05/03/16 0600  Gross per 24 hour  Intake              360 ml  Output             2075 ml  Net            -1715 ml   I/O 5 L negatvie    General: Alert, awake, oriented x3, in no acute distress Neck:  JVP is normal Heart: Regular rate and rhythm, without murmurs, rubs, gallops.  Lungs: Clear to auscultation.  No rales or wheezes. Exemities:  No edema.   Neuro: Grossly intact, nonfocal.  TEle  SR  NO signif pauses  Lab Results: Results for orders placed or performed during the hospital encounter of 04/27/16 (from the past 24 hour(s))  CBC     Status: Abnormal   Collection Time: 05/02/16  9:29 AM  Result Value Ref Range   WBC 21.0 (H) 4.0 - 10.5 K/uL   RBC 4.23 4.22 - 5.81 MIL/uL   Hemoglobin 12.6 (L) 13.0 - 17.0 g/dL   HCT 37.7 (L) 39.0 - 52.0 %   MCV 89.1 78.0 - 100.0 fL   MCH 29.8 26.0 - 34.0 pg   MCHC 33.4 30.0 - 36.0 g/dL   RDW 13.9 11.5 - 15.5 %   Platelets 266 150 - 400 K/uL  Basic metabolic panel     Status: Abnormal   Collection Time: 05/03/16  2:47 AM  Result Value Ref Range   Sodium 135 135 - 145 mmol/L   Potassium 3.5 3.5 - 5.1 mmol/L   Chloride 103 101 - 111 mmol/L   CO2 23 22 - 32 mmol/L   Glucose, Bld 132 (H) 65 - 99 mg/dL   BUN 24 (H) 6 - 20 mg/dL   Creatinine, Ser 1.00 0.61 - 1.24 mg/dL   Calcium 8.7 (L) 8.9 - 10.3 mg/dL   GFR calc non Af Amer >60 >60 mL/min   GFR calc Af Amer >60 >60 mL/min   Anion gap 9 5 - 15    Studies/Results: Dg Chest 2 View  Result Date: 05/02/2016 CLINICAL DATA:  Cardiac arrhythmia.  Fever Syncope. EXAM: CHEST  2 VIEW COMPARISON:   05/01/2016 FINDINGS: Cardiomegaly. Calcified tortuous aorta. No active infiltrates or failure. Mild hyperinflation suggesting COPD. Improved interstitial prominence compared with yesterday's radiograph. Osteopenia. IMPRESSION: Cardiomegaly. Lung fields now essentially clear. No active infiltrates or failure. Electronically Signed   By: Staci Righter M.D.   On: 05/02/2016 19:24    Medications: Reviewed   @PROBHOSP @  1  STEMI  S/p PCI/DES to RCA  Pt with residual 90% LCx lesion  Ostial  Plan conservative Rx for now.    2  Rhythm  COntinue to follow  EP following  No signif pauses  Will try to get out of bed today  3  HTN  BP OK  4  Fever  T 101 last night  Will repeat UA/Urine cult  Blood cultures neg  CXR negative  REviewed with nursing  COncern for aspiration  Will get swallowing study    Ask PT to see.    LOS: 6 days   Dorris Carnes 05/03/2016, 8:22 AM

## 2016-05-04 ENCOUNTER — Inpatient Hospital Stay (HOSPITAL_COMMUNITY): Payer: Medicare Other

## 2016-05-04 LAB — URIC ACID: URIC ACID, SERUM: 5.6 mg/dL (ref 4.4–7.6)

## 2016-05-04 LAB — BASIC METABOLIC PANEL
Anion gap: 8 (ref 5–15)
BUN: 21 mg/dL — AB (ref 6–20)
CALCIUM: 8.7 mg/dL — AB (ref 8.9–10.3)
CO2: 24 mmol/L (ref 22–32)
CREATININE: 0.86 mg/dL (ref 0.61–1.24)
Chloride: 104 mmol/L (ref 101–111)
GFR calc Af Amer: >60 mL/min (ref >60)
GLUCOSE: 130 mg/dL — AB (ref 65–99)
POTASSIUM: 4.2 mmol/L (ref 3.5–5.1)
SODIUM: 136 mmol/L (ref 135–145)

## 2016-05-04 LAB — CBC
HEMATOCRIT: 34.5 % — AB (ref 39.0–52.0)
Hemoglobin: 11.7 g/dL — ABNORMAL LOW (ref 13.0–17.0)
MCH: 30.4 pg (ref 26.0–34.0)
MCHC: 33.9 g/dL (ref 30.0–36.0)
MCV: 89.6 fL (ref 78.0–100.0)
Platelets: 370 10*3/uL (ref 150–400)
RBC: 3.85 MIL/uL — ABNORMAL LOW (ref 4.22–5.81)
RDW: 14 % (ref 11.5–15.5)
WBC: 15.4 10*3/uL — AB (ref 4.0–10.5)

## 2016-05-04 LAB — URINE CULTURE: CULTURE: NO GROWTH

## 2016-05-04 MED ORDER — SODIUM CHLORIDE 0.9 % IV SOLN
INTRAVENOUS | Status: AC
  Administered 2016-05-04: 15:00:00 via INTRAVENOUS

## 2016-05-04 NOTE — Progress Notes (Signed)
Speech Language Pathology Note  Patient Details  Name: BRAD CUNA MRN: [id number] Date of Birth: 28-Oct-1931 Today's Date: 05/04/2016  MBSS complete. Full report located under chart review in imaging section. Recommend: Continue with regular diet. Meds whole with puree. Mild pharyngeal dysphagia.     [place], [place] Pager [phone] 05/04/2016, 11:40 AM

## 2016-05-04 NOTE — Clinical Social Work Note (Signed)
Clinical Social Work Assessment  Patient Details  Name: Joshua [name] MRN: [id number] Date of Birth: 10/16/1931  Date of referral:  05/04/16               Reason for consult:  Facility Placement                Permission sought to share information with:  Family Supports Permission granted to share information::  Yes, Verbal Permission Granted  Name::     Wai Litt  Relationship::  daughter  Housing/Transportation Living arrangements for the past 2 months:  [place] of Information:  Patient, Adult Children Patient Interpreter Needed:  None Criminal Activity/Legal Involvement Pertinent to Current Situation/Hospitalization:  No - Comment as needed Significant Relationships:  Adult Children Lives with:  Self Do you feel safe going back to the place where you live?  Yes Need for family participation in patient care:  Yes (Comment)  Care giving concerns:  No care giving concerns identified.   Social Worker assessment / plan:  CSW met with pt and daughter to address consult for SNF. CSW introduced herself and explained role of social work. CSW also explained the process of discharging to SNF with Medicare. PT is recommending SNF. Pt lives alone and has supportive adult children. 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 Pt is agreeable to SNF if needed at discharge. CSW initiated SNF search and will follow up with bed offers. CSW will continue to follow.   Employment status:  Retired Forensic scientist:  [employer] PT Recommendations:  [place] / Referral to community resources:  [place]  Patient/Family's Response to care:  Pt and daughter were appreciative of CSW support  Patient/Family's Understanding of and Emotional Response to Diagnosis, Current Treatment, and Prognosis:  Pt and daughter are aware of need for STR at [facility].   Emotional Assessment Appearance:  Appears stated  age Attitude/Demeanor/Rapport:  Other (Appropriate) Affect (typically observed):  Pleasant Orientation:  Oriented to Self, Oriented to Place, Oriented to  Time, Oriented to Situation Alcohol / Substance use:  Never Used Psych involvement (Current and /or in the community):  No (Comment)  Discharge Needs  Concerns to be addressed:  Adjustment to Illness Readmission within the last 30 days:  No Current discharge risk:  None Barriers to Discharge:      Darden Dates, LCSW 05/04/2016, 5:16 PM

## 2016-05-04 NOTE — Progress Notes (Signed)
CARDIAC REHAB PHASE I   PRE:  Rate/Rhythm: 74 first deg/wenkebach    BP: lying 148/78, sitting 159/77, standing 129/55    SaO2: 98 RA  MODE:  Ambulation: two steps to chair   POST:  Rate/Rhythm: 81 first deg    BP: sitting 123/63     SaO2: 96 RA  Pt with higher BP today in bed and sitting on EOB. With standing BP dropped 30 pts but pt asx. He is much clearer and somewhat stronger today. Able to take 2-3 steps over to recliner using RW, assist x2 with gait belt. He struggles to move his right leg due to an old injury. He c/o left ankle hurting when he puts pressure on it. BP about the same in recliner as standing. Pt did not have any change in his HR/rhythm today. Feels fairly well. Will f/u tomorrow. Needs more PT as well for mobility/strengthening. [place], ACSM 05/04/2016 3:10 PM

## 2016-05-04 NOTE — Progress Notes (Signed)
Subjective: No CP  NO dizziness  No SOB   Objective: Vitals:   05/04/16 1000 05/04/16 1053 05/04/16 1100 05/04/16 1141  BP: 140/62 120/69 140/65 (!) 125/58  Pulse: 81 80 78 72  Resp: (!) 35 (!) 24 20 (!) 24  Temp:    97.7 F (36.5 C)  TempSrc:    Oral  SpO2: 93% 95% 97% 95%  Weight:      Height:       Weight change:   Intake/Output Summary (Last 24 hours) at 05/04/16 1205 Last data filed at 05/04/16 1100  Gross per 24 hour  Intake             1220 ml  Output             1255 ml  Net              -35 ml    General: Alert, awake, oriented x3, in no acute distress Neck:  JVP is normal Heart: Regular rate and rhythm, without murmurs, rubs, gallops.  Lungs: Clear to auscultation.  No rales or wheezes. Exemities:  No edema.   Neuro: Grossly intact, nonfocal.  Tele  SR    Lab Results: Results for orders placed or performed during the hospital encounter of 04/27/16 (from the past 24 hour(s))  Urinalysis, Routine w reflex microscopic (not at [facility])     Status: Abnormal   Collection Time: 05/03/16  6:21 PM  Result Value Ref Range   Color, Urine AMBER (A) YELLOW   APPearance CLOUDY (A) CLEAR   Specific Gravity, Urine 1.029 1.005 - 1.030   pH 5.5 5.0 - 8.0   Glucose, UA NEGATIVE NEGATIVE mg/dL   Hgb urine dipstick NEGATIVE NEGATIVE   Bilirubin Urine SMALL (A) NEGATIVE   Ketones, ur NEGATIVE NEGATIVE mg/dL   Protein, ur 100 (A) NEGATIVE mg/dL   Nitrite NEGATIVE NEGATIVE   Leukocytes, UA NEGATIVE NEGATIVE  Urine microscopic-add on     Status: Abnormal   Collection Time: 05/03/16  6:21 PM  Result Value Ref Range   Squamous Epithelial / LPF 0-5 (A) NONE SEEN   WBC, UA 0-5 0 - 5 WBC/hpf   RBC / HPF 0-5 0 - 5 RBC/hpf   Bacteria, UA RARE (A) NONE SEEN   Casts GRANULAR CAST (A) NEGATIVE  Basic metabolic panel     Status: Abnormal   Collection Time: 05/04/16  3:22 AM  Result Value Ref Range   Sodium 136 135 - 145 mmol/L   Potassium 4.2 3.5 - 5.1 mmol/L   Chloride 104 101  - 111 mmol/L   CO2 24 22 - 32 mmol/L   Glucose, Bld 130 (H) 65 - 99 mg/dL   BUN 21 (H) 6 - 20 mg/dL   Creatinine, Ser 0.86 0.61 - 1.24 mg/dL   Calcium 8.7 (L) 8.9 - 10.3 mg/dL   GFR calc non Af Amer >60 >60 mL/min   GFR calc Af Amer >60 >60 mL/min   Anion gap 8 5 - 15  CBC     Status: Abnormal   Collection Time: 05/04/16  3:22 AM  Result Value Ref Range   WBC 15.4 (H) 4.0 - 10.5 K/uL   RBC 3.85 (L) 4.22 - 5.81 MIL/uL   Hemoglobin 11.7 (L) 13.0 - 17.0 g/dL   HCT 34.5 (L) 39.0 - 52.0 %   MCV 89.6 78.0 - 100.0 fL   MCH 30.4 26.0 - 34.0 pg   MCHC 33.9 30.0 - 36.0 g/dL   RDW  14.0 11.5 - 15.5 %   Platelets 370 150 - 400 K/uL  Uric acid     Status: None   Collection Time: 05/04/16  3:22 AM  Result Value Ref Range   Uric Acid, Serum 5.6 4.4 - 7.6 mg/dL    Studies/Results: Dg Swallowing Func-speech Pathology  Result Date: 05/04/2016 Objective Swallowing Evaluation: Type of Study: MBS-Modified Barium Swallow Study Patient Details Name: Joshua [name] MRN: [id number] Date of Birth: Nov 23, 1931 Today's Date: 05/04/2016 Time: SLP Start Time (ACUTE ONLY): 1040-SLP Stop Time (ACUTE ONLY): 1112 SLP Time Calculation (min) (ACUTE ONLY): 32 min Past Medical History: Past Medical History: Diagnosis Date . Allergy   pcn . Arteriovenous malformation   ([place]) . Blood transfusion   right eye  no surgery as yet . ED (erectile dysfunction)  . GERD (gastroesophageal reflux disease)  . Hypercholesterolemia  . Hypertension  . Prostate cancer ([place]) 11/02/11 bxs  Adenocarcinoma,Gleason=3+3=6 & 3+4=7,PSA+1.58 Volume=19cc . Seizures ([place])  Past Surgical History: Past Surgical History: Procedure Laterality Date . APPENDECTOMY   . CARDIAC CATHETERIZATION N/A 04/27/2016  Procedure: Left Heart Cath and Coronary Angiography;  Surgeon: Belva Crome, MD;  Location: [facility];  Service: Cardiovascular;  Laterality: N/A; . CARDIAC CATHETERIZATION N/A 04/27/2016  Procedure: Temporary Pacemaker;  Surgeon: Belva Crome, MD;   Location: [facility];  Service: Cardiovascular;  Laterality: N/A; . CARDIAC CATHETERIZATION N/A 04/27/2016  Procedure: Coronary Stent Intervention;  Surgeon: Belva Crome, MD;  Location: [facility];  Service: Cardiovascular;  Laterality: N/A;  Proximal RCA Mid RCA . CRANIOTOMY  1980 . CYSTOSCOPY  01/19/2012  Procedure: CYSTOSCOPY;  Surgeon: Malka So, MD;  Location: [facility];  Service: Urology;;  no seeds found in bladder . CYSTOSCOPY W/ RETROGRADES Bilateral 04/19/2016  Procedure: CYSTOSCOPY WITH BILATERAL RETROGRADE PYELOGRAM TRANSURETHRAL RESECTION OF BLADDER TUMOR ;  Surgeon: [name], MD;  Location: [facility];  Service: Urology;  Laterality: Bilateral; . RADIOACTIVE SEED IMPLANT  01/19/2012  Procedure: RADIOACTIVE SEED IMPLANT;  Surgeon: Malka So, MD;  Location: [facility];  Service: Urology;  Laterality: N/A;  66 seeds implanted HPI: 82 y/o gentleman with PMG significant for seizure disorder, AVM, HTN, GERD. Presented as code STEMI. Swallow assessment ordered secondary to concern for possible aspiration with temp spikes and increased WBC.  Subjective: Pleasant, hard working. Assessment / Plan / Recommendation CHL IP CLINICAL IMPRESSIONS 05/04/2016 Therapy Diagnosis Mild pharyngeal phase dysphagia Clinical Impression Pt presents with mild pharyngeal phase dysphagia characterized by mild pharyngeal delay with flash penetration of thins and difficulty coordinating meds with liquids. Pt also has generalized pharyngeal weakness resulting in mild residue with all consistencies. Pt was able to manage residue [facility] with self-initiated double swallow with all boluses. Recommend: Regular diet with thins, meds whole with puree, upright positioning with all PO, slow rate of intake and small bites/sips. Pt to take rest breaks as needed when fatigued. Pt/family and RN all verbalized agreement with recommendations. Will sign off at this time.  Impact on safety and function Mild  aspiration risk   CHL IP TREATMENT RECOMMENDATION 05/04/2016 Treatment Recommendations No treatment recommended at this time   Prognosis 05/04/2016 Prognosis for Safe Diet Advancement Good Barriers to Reach Goals -- Barriers/Prognosis Comment -- CHL IP DIET RECOMMENDATION 05/04/2016 SLP Diet Recommendations Regular solids;Thin liquid Liquid Administration via Cup;Straw Medication Administration Whole meds with puree Compensations Slow rate;Small sips/bites Postural Changes Seated upright at 90 degrees;Remain semi-upright after after feeds/meals (Comment)  CHL IP OTHER RECOMMENDATIONS 05/04/2016 Recommended Consults -- Oral Care Recommendations Oral care QID Other Recommendations --   CHL IP FOLLOW UP RECOMMENDATIONS 05/04/2016 Follow up Recommendations None   No flowsheet data found.     CHL IP ORAL PHASE 05/04/2016 Oral Phase WFL Oral - Pudding Teaspoon -- Oral - Pudding Cup -- Oral - Honey Teaspoon -- Oral - Honey Cup -- Oral - Nectar Teaspoon -- Oral - Nectar Cup -- Oral - Nectar Straw -- Oral - Thin Teaspoon -- Oral - Thin Cup -- Oral - Thin Straw -- Oral - Puree -- Oral - Mech Soft -- Oral - Regular -- Oral - Multi-Consistency -- Oral - Pill -- Oral Phase - Comment --  CHL IP PHARYNGEAL PHASE 05/04/2016 Pharyngeal Phase Impaired Pharyngeal- Pudding Teaspoon -- Pharyngeal -- Pharyngeal- Pudding Cup -- Pharyngeal -- Pharyngeal- Honey Teaspoon -- Pharyngeal -- Pharyngeal- Honey Cup -- Pharyngeal -- Pharyngeal- Nectar Teaspoon -- Pharyngeal -- Pharyngeal- Nectar Cup -- Pharyngeal -- Pharyngeal- Nectar Straw -- Pharyngeal -- Pharyngeal- Thin Teaspoon -- Pharyngeal -- Pharyngeal- Thin Cup Delayed swallow initiation-vallecula;Penetration/Aspiration before swallow;Pharyngeal residue - pyriform Pharyngeal Material enters airway, remains ABOVE vocal cords then ejected out Pharyngeal- Thin Straw Delayed swallow initiation-vallecula;Penetration/Aspiration before swallow;Pharyngeal residue - pyriform Pharyngeal Material enters  airway, remains ABOVE vocal cords then ejected out Pharyngeal- Puree -- Pharyngeal -- Pharyngeal- Mechanical Soft -- Pharyngeal -- Pharyngeal- Regular -- Pharyngeal -- Pharyngeal- Multi-consistency -- Pharyngeal -- Pharyngeal- Pill Pharyngeal residue - pyriform Pharyngeal -- Pharyngeal Comment --  No flowsheet data found. No flowsheet data found. Vinetta Bergamo MA, CCC-SLP 05/04/2016, 11:38 AM               Medications: Reviewed    @PROBHOSP @  1  Hx STEMI   S/P PCI / DES of RCA  Conservative Rx of 90% ostial LCx for now  2.  Rhythm  No more pauses  Follow   Was orthostatic yesterday  Fluids given  BP is actually a little up today  3.  ID  Afebrile WBC is decraseing  UA is negatvie  Follow Swallw study today.  PT and rehab to see    LOS: 7 days   Dorris Carnes 05/04/2016, 12:05 PM

## 2016-05-04 NOTE — Progress Notes (Signed)
PT report noted BP standing lower than supine  Still adequate  Continue to follow  Encourage PO fluids  IV given earlier. Recheck in AM

## 2016-05-04 NOTE — [place] FL2 (Signed)
[place] MEDICAID FL2 LEVEL OF CARE SCREENING TOOL     IDENTIFICATION  Patient Name: Joshua [name] Birthdate: 1932/07/06 Sex: male Admission Date (Current Location): 04/27/2016  [facility] [place] Number:  Herbalist and Address:  [facility] [place] [address] [place], [place] [address]      Provider Number: [id number]  Attending Physician Name and Address:  Belva Crome, MD  Relative Name and Phone Number:       Current Level of Care: Hospital Recommended Level of Care: [place] Prior Approval Number:    Date Approved/Denied:   PASRR Number: [id number]  Discharge Plan: SNF    Current Diagnoses: Patient Active Problem List   Diagnosis Date Noted  . Pyrexia   . Leukocytosis   . 1St degree AV block   . Cardiogenic shock ([place])   . AV block, Mobitz 2 04/27/2016  . ST elevation (STEMI) myocardial infarction involving other coronary artery of inferior wall ([place]) 04/27/2016  . STEMI (ST elevation myocardial infarction) ([place]) 04/27/2016  . Bladder cancer ([place]) 04/18/2016  . Vitamin D deficiency 10/15/2015  . Prostate cancer ([place])   . Hypercholesterolemia   . Essential hypertension   . Seizures ([place])   . GERD 08/22/2008  . COLONIC POLYPS, ADENOMATOUS, HX OF 08/22/2008    Orientation RESPIRATION BLADDER Height & Weight     Self, Situation, Time, Place  Normal Continent Weight: 190 lb 4.1 oz (86.3 kg) Height:  6' (182.9 cm)  BEHAVIORAL SYMPTOMS/MOOD NEUROLOGICAL BOWEL NUTRITION STATUS      Continent Diet (Regular)  AMBULATORY STATUS COMMUNICATION OF NEEDS Skin   Limited Assist Verbally Normal                       Personal Care Assistance Level of Assistance  Bathing, Feeding, Dressing Bathing Assistance: Limited assistance Feeding assistance: Independent Dressing Assistance: Limited assistance     Functional Limitations Info  Sight, Hearing, Speech Sight Info: Adequate Hearing Info: Adequate Speech  Info: Adequate    SPECIAL CARE FACTORS FREQUENCY  PT (By licensed PT)     PT Frequency: 5              Contractures Contractures Info: Not present    Additional Factors Info  Code Status, Allergies Code Status Info: Full Code Allergies Info: Penicillins           Current Medications (05/04/2016):  This is the current hospital active medication list Current Facility-Administered Medications  Medication Dose Route Frequency Provider Last Rate Last Dose  . 0.9 %  sodium chloride infusion  250 mL Intravenous PRN Belva Crome, MD      . 0.9 %  sodium chloride infusion   Intravenous Continuous Belva Crome, MD   Stopped at 05/01/16 1400  . 0.9 %  sodium chloride infusion   Intravenous Continuous Fay Records, MD 125 mL/hr at 05/04/16 1510    . acetaminophen (TYLENOL) tablet 650 mg  650 mg Oral Q4H PRN Belva Crome, MD      . antiseptic oral rinse (CPC / CETYLPYRIDINIUM CHLORIDE 0.05%) solution 7 mL  7 mL Mouth Rinse BID Belva Crome, MD   7 mL at 05/04/16 1000  . aspirin chewable tablet 81 mg  81 mg Oral Daily Belva Crome, MD   81 mg at 05/04/16 0943  . atorvastatin (LIPITOR) tablet 80 mg  80 mg Oral q1800 Belva Crome, MD   80 mg  at 05/03/16 1741  . clopidogrel (PLAVIX) tablet 75 mg  75 mg Oral Daily Belva Crome, MD   75 mg at 05/04/16 0943  . heparin injection 5,000 Units  5,000 Units Subcutaneous Q8H Belva Crome, MD   5,000 Units at 05/04/16 1508  . magnesium hydroxide (MILK OF MAGNESIA) suspension 30 mL  30 mL Oral Daily PRN Belva Crome, MD      . ondansetron [facility]) injection 4 mg  4 mg Intravenous Q6H PRN Belva Crome, MD      . oxyCODONE-acetaminophen (PERCOCET/ROXICET) 5-325 MG per tablet 1-2 tablet  1-2 tablet Oral Q4H PRN Belva Crome, MD      . PHENobarbital (LUMINAL) tablet 64.8 mg  64.8 mg Oral q morning - 10a Belva Crome, MD   64.8 mg at 05/04/16 [id number]   And  . PHENobarbital (LUMINAL) tablet 97.2 mg  97.2 mg Oral QPM Belva Crome, MD   97.2 mg at  05/03/16 2018  . phenytoin (DILANTIN) ER capsule 100 mg  100 mg Oral BID Belva Crome, MD   100 mg at 05/04/16 0943  . polyvinyl alcohol (LIQUIFILM TEARS) 1.4 % ophthalmic solution 1 drop  1 drop Both Eyes QHS PRN Fay Records, MD   1 drop at 05/03/16 2027  . sodium chloride flush (NS) 0.9 % injection 3 mL  3 mL Intravenous Q12H Belva Crome, MD   3 mL at 05/03/16 2020  . sodium chloride flush (NS) 0.9 % injection 3 mL  3 mL Intravenous PRN Belva Crome, MD         Discharge Medications: Please see discharge summary for a list of discharge medications.  Relevant Imaging Results:  Relevant Lab Results:   Additional Information SSN:  [id number]  Darden Dates, LCSW

## 2016-05-04 NOTE — Clinical Social Work Placement (Signed)
   CLINICAL SOCIAL WORK PLACEMENT  NOTE  Date:  05/04/2016  Patient Details  Name: KEELER MCWAIN MRN: [id number] Date of Birth: [birth year]  Clinical Social Work is seeking post-discharge placement for this patient at the [place] level of care (*CSW will initial, date and re-position this form in  chart as items are completed):  Yes   Patient/family provided with [place] Work Department's list of facilities offering this level of care within the geographic area requested by the patient (or if unable, by the patient's family).  Yes   Patient/family informed of their freedom to choose among providers that offer the needed level of care, that participate in Medicare, Medicaid or managed care program needed by the patient, have an available bed and are willing to accept the patient.  Yes   Patient/family informed of [place]'s ownership interest in [facility] and [facility]: [facility], as well as of the fact that they are under no obligation to receive care at these facilities.  PASRR submitted to EDS on 05/04/16     PASRR number received on 05/04/16     Existing PASRR number confirmed on       FL2 transmitted to all facilities in geographic area requested by pt/family on 05/04/16     FL2 transmitted to all facilities within larger geographic area on       Patient informed that his/her managed care company has contracts with or will negotiate with certain facilities, including the following:            Patient/family informed of bed offers received.  Patient chooses bed at       Physician recommends and patient chooses bed at      Patient to be transferred to   on  .  Patient to be transferred to facility by       Patient family notified on   of transfer.  Name of family member notified:        PHYSICIAN       Additional Comment:    _______________________________________________ Darden Dates, LCSW 05/04/2016, 5:10 PM

## 2016-05-05 LAB — BASIC METABOLIC PANEL
ANION GAP: 9 (ref 5–15)
BUN: 17 mg/dL (ref 6–20)
CALCIUM: 8.7 mg/dL — AB (ref 8.9–10.3)
CO2: 25 mmol/L (ref 22–32)
Chloride: 101 mmol/L (ref 101–111)
Creatinine, Ser: 0.79 mg/dL (ref 0.61–1.24)
GFR calc Af Amer: >60 mL/min (ref >60)
GLUCOSE: 130 mg/dL — AB (ref 65–99)
Potassium: 3.4 mmol/L — ABNORMAL LOW (ref 3.5–5.1)
Sodium: 135 mmol/L (ref 135–145)

## 2016-05-05 LAB — CBC
HCT: 32.9 % — ABNORMAL LOW (ref 39.0–52.0)
HEMOGLOBIN: 10.7 g/dL — AB (ref 13.0–17.0)
MCH: 29.1 pg (ref 26.0–34.0)
MCHC: 32.5 g/dL (ref 30.0–36.0)
MCV: 89.4 fL (ref 78.0–100.0)
Platelets: 395 10*3/uL (ref 150–400)
RBC: 3.68 MIL/uL — ABNORMAL LOW (ref 4.22–5.81)
RDW: 13.9 % (ref 11.5–15.5)
WBC: 12.7 10*3/uL — ABNORMAL HIGH (ref 4.0–10.5)

## 2016-05-05 MED ORDER — POTASSIUM CHLORIDE CRYS ER 20 MEQ PO TBCR
40.0000 meq | EXTENDED_RELEASE_TABLET | Freq: Once | ORAL | Status: AC
  Administered 2016-05-05: 40 meq via ORAL
  Filled 2016-05-05: qty 2

## 2016-05-05 NOTE — Progress Notes (Signed)
CARDIAC REHAB PHASE I   PRE:  Rate/Rhythm: 55 1HB SR  BP:  Supine:   Sitting: 95/68, 129/65  Standing:    SaO2: 95%RA    MODE:  Ambulation: outside of room ft   POST:  Rate/Rhythm: 80  BP:  Supine:   Sitting: 108/64  Standing:    SaO2: 96%RA 1100-1130 Pt assisted to [facility] with asst x 2 and gait belt use, a little difficult to stand.  Pt walked just outside of door with asst x 2, gait belt use and rolling walker. Also had one asst to follow with recliner. Pt had to sit in recliner outside of door and we wheeled back to room. Pt denied dizziness and BP 108/64 in hall way. Very deconditioned but wanting to walk. Call bell given and kept in recliner.   Graylon Good, RN BSN  05/05/2016 11:25 AM

## 2016-05-05 NOTE — Progress Notes (Addendum)
    Subjective:  Feeling better. No CP or dyspnea. Eager to get up and try to walk.   Objective:  Vital Signs in the last 24 hours: Temp:  [97.7 F (36.5 C)-99.7 F (37.6 C)] 98.1 F (36.7 C) (08/17 0800) Pulse Rate:  [66-88] 68 (08/17 0800) Resp:  [15-30] 18 (08/17 0800) BP: (119-159)/(54-102) 134/58 (08/17 0800) SpO2:  [92 %-100 %] 93 % (08/17 0800)  Intake/Output from previous day: 08/16 0701 - 08/17 0700 In: -  Out: 1120 [Urine:1120]  Physical Exam: Pt is alert and oriented, pleasant elderly male in NAD HEENT: normal Neck: JVP - normal Lungs: CTA bilaterally CV: RRR without murmur or gallop Abd: soft, NT, Positive BS, no hepatomegaly Ext: 1+ bilateral pedal edema Skin: warm/dry no rash  Lab Results:  Recent Labs  05/04/16 0322 05/05/16 0244  WBC 15.4* 12.7*  HGB 11.7* 10.7*  PLT 370 395    Recent Labs  05/04/16 0322 05/05/16 0244  NA 136 135  K 4.2 3.4*  CL 104 101  CO2 24 25  GLUCOSE 130* 130*  BUN 21* 17  CREATININE 0.86 0.79   No results for input(s): TROPONINI in the last 72 hours.  Invalid input(s): CK, MB  Tele: Personally reviewed: sinus rhythm  Assessment/Plan:  1. Acute inferior MI - s/p PCI. Residual CAD noted. Cath report reviewed. Initial medical therapy recommended unless recurrent ischemia.   2. Bradycardia/pauses: no recurrence.  3. Weakness/deconditioning: mobilize with cardiac rehab/PT. Tx tele bed today.  Dispo: tele today. Consider addition of ACE/ARB tomorrow if BP remains stable.  Sherren Mocha, M.D. 05/05/2016, 10:49 AM

## 2016-05-05 NOTE — Clinical Social Work Note (Addendum)
CSW was notified by [facility] that pt's secondary insurance will not cover Medicare copays, however they are still able to make a bed offer.   CSW met with pt and daughter to provide bed offers for SNF. CSW also explained the secondary insurance, as it is not a supplement. CSW will follow with bed choice and discharge planning needs.   Darden Dates, MSW, LCSW  Clinical Social Worker  [phone]  ADDENDUM: CSW spoke with admissions coordinator at [facility], who is able to make a bed offer. Admissions confirmed that [employer] ([employer]) is in network with facility, and it will cover 90% of copay ($164.50 a day). CSW will update family.  Darden Dates, MSW, LCSW

## 2016-05-06 LAB — CBC
HEMATOCRIT: 32 % — AB (ref 39.0–52.0)
HEMOGLOBIN: 10.6 g/dL — AB (ref 13.0–17.0)
MCH: 29.4 pg (ref 26.0–34.0)
MCHC: 33.1 g/dL (ref 30.0–36.0)
MCV: 88.9 fL (ref 78.0–100.0)
Platelets: 420 10*3/uL — ABNORMAL HIGH (ref 150–400)
RBC: 3.6 MIL/uL — AB (ref 4.22–5.81)
RDW: 14 % (ref 11.5–15.5)
WBC: 12.7 10*3/uL — AB (ref 4.0–10.5)

## 2016-05-06 LAB — CULTURE, BLOOD (ROUTINE X 2)
CULTURE: NO GROWTH
Culture: NO GROWTH

## 2016-05-06 LAB — BASIC METABOLIC PANEL
ANION GAP: 10 (ref 5–15)
BUN: 14 mg/dL (ref 6–20)
CHLORIDE: 103 mmol/L (ref 101–111)
CO2: 24 mmol/L (ref 22–32)
Calcium: 8.9 mg/dL (ref 8.9–10.3)
Creatinine, Ser: 0.81 mg/dL (ref 0.61–1.24)
Glucose, Bld: 129 mg/dL — ABNORMAL HIGH (ref 65–99)
POTASSIUM: 4.2 mmol/L (ref 3.5–5.1)
SODIUM: 137 mmol/L (ref 135–145)

## 2016-05-06 NOTE — Progress Notes (Signed)
CARDIAC REHAB PHASE I   PRE:  Rate/Rhythm: 76 1HB  BP:  Supine:   Sitting: 128/59  Standing:    SaO2: 95%RA  MODE:  Ambulation: 200 ft   POST:  Rate/Rhythm: 79 1HB/wenkebach  BP:  Supine:   Sitting: 134/71  Standing:    SaO2:  1444-1520 Pt walked 200 ft on RA with rolling walker and gait belt use and asst x 1 with one asst to follow with wheelchair. Pt stronger today but does have tendency to get feet too close together. Very motivated. Rolled back to room and to bed to rest. Tolerated well.   Graylon Good, RN BSN  05/06/2016 3:20 PM

## 2016-05-06 NOTE — Care Management Note (Signed)
Case Management Note  Patient Details  Name: Joshua [name] MRN: [id number] Date of Birth: May 16, 1932  Subjective/Objective:  Patient for SNF placement , CSW following.                  Action/Plan:   Expected Discharge Date:  04/30/16               Expected Discharge Plan:  Skilled Nursing Facility  In-House Referral:  Clinical Social Work  Discharge planning Services  CM Consult  Post Acute Care Choice:    Choice offered to:     DME Arranged:    DME Agency:     HH Arranged:    [place] Agency:     Status of Service:  Completed, signed off  If discussed at [employer], dates discussed:    Additional Comments:  Zenon Mayo, RN 05/06/2016, 3:16 PM

## 2016-05-06 NOTE — Progress Notes (Signed)
Physical Therapy Treatment Patient Details Name: DEIONTE DELLEDONNE MRN: [id number] DOB: 1932/05/26 Today's Date: 05/06/2016    History of Present Illness 82 y.o. male admitted to [facility] on 04/27/16 for N/V, weakness, brady cardia with second degree heart block.  Found to have a STEMI.  Pt s/p PCI/DES to RCA on 04/27/16.  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      PT Comments    Pt progressing well with gait today, continues to need second person for safety and to get up from lower surfaces.  VSS throughout.  PT continues to recommend SNF placement as he is still not strong enough to care for himself at home yet.    Follow Up Recommendations  SNF     Equipment Recommendations  Rolling walker with 5" wheels    Recommendations for Other Services   NA     Precautions / Restrictions Precautions Precautions: Fall Precaution Comments: weak, feels like he is falling forward    Mobility  Bed Mobility Overal bed mobility: Needs Assistance Bed Mobility: Supine to Sit     Supine to sit: Mod assist;HOB elevated     General bed mobility comments: Mod assist to support trunk for pt to come to sitting EOB, using left arm on bed rail and pulling up against therapist from elevated bed with right arm.   Transfers Overall transfer level: Needs assistance Equipment used: Rolling walker (2 wheeled) Transfers: Sit to/from Stand Sit to Stand: Min assist;From elevated surface;+2 safety/equipment;Mod assist         General transfer comment: Min assist to support trunk and stabilize RW from elevated bed, two person mod assist to support trunk to get to feet from much lower recliner chair.   Ambulation/Gait Ambulation/Gait assistance: +2 safety/equipment;Min assist Ambulation Distance (Feet): 200 Feet (with one seated rest break) Assistive device: Rolling walker (2 wheeled) Gait Pattern/deviations: Step-through pattern;Trunk flexed Gait velocity: decreased    General Gait Details: Verbal cues for upright posture, correct hand placement on RW and closer proximity to RW.  Pt needed one seated rest break due to fatigue, but was able to continue after.  VSS throughout gait.  HR 87, BP136/64, O2 sats 94% on RA          Balance Overall balance assessment: Needs assistance Sitting-balance support: Feet supported;No upper extremity supported Sitting balance-Leahy Scale: Poor Sitting balance - Comments: posterior lean, min guard assist for forward balance.  Postural control: Posterior lean Standing balance support: Bilateral upper extremity supported Standing balance-Leahy Scale: Poor                      Cognition Arousal/Alertness: Awake/alert Behavior During Therapy: WFL for tasks assessed/performed Overall Cognitive Status: Within Functional Limits for tasks assessed                      Exercises General Exercises - Lower Extremity Ankle Circles/Pumps: AROM;Both;20 reps Quad Sets: AROM;Both;10 reps Heel Slides: AAROM;Both;10 reps        Pertinent Vitals/Pain Pain Assessment: Faces Faces Pain Scale: Hurts even more Pain Location: left arch in foot Pain Descriptors / Indicators: Sharp Pain Intervention(s): Limited activity within patient's tolerance;Monitored during session;Repositioned           PT Goals (current goals can now be found in the care plan section) Acute Rehab PT Goals Patient Stated Goal: to get stronger so he can go home Progress towards PT goals: Progressing toward goals  Frequency  Min 3X/week    PT Plan Current plan remains appropriate       End of Session Equipment Utilized During Treatment: Gait belt Activity Tolerance: Patient limited by fatigue Patient left: in chair;with call bell/phone within reach;with family/visitor present     Time: 1009-1040 PT Time Calculation (min) (ACUTE ONLY): 31 min  Charges:  $Gait Training: 8-22 mins $Therapeutic Exercise: 8-22 mins                       Shontavia Mickel B. De [place], [place], DPT [phone]   05/06/2016, 3:30 PM

## 2016-05-06 NOTE — Consult Note (Signed)
   [facility] Inpatient Consult   05/06/2016  Joshua [name] 01-05-1932 [id number]   Patient was screened for post hospital follow up.Patient is eligible for services with his Medicare.   Current discharge plan reveals the patient will go to a skilled nursing facility for rehab. No current THN needs identified. Patient is an 89M with hypertension, hyperlipidemia, seizure disorder and prostate cancer her with STEMI complicated by long first degree heart block and Mobitz II.  For questions, or referrals please contact:  Natividad Brood, RN BSN [place] [facility] Liaison  [phone] business mobile phone Toll free office [phone]

## 2016-05-06 NOTE — Progress Notes (Signed)
   Subjective: Breathing is OK  No CP   Objective: Vitals:   05/05/16 2103 05/06/16 0444 05/06/16 0555 05/06/16 1014  BP: 135/71  133/61 135/69  Pulse: 81 74 76 71  Resp: 16  18   Temp: 98.2 F (36.8 C) 98.1 F (36.7 C) 98 F (36.7 C)   TempSrc: Oral Oral Oral   SpO2: 96%  94% 95%  Weight:   181 lb 14.4 oz (82.5 kg)   Height:       Weight change:   Intake/Output Summary (Last 24 hours) at 05/06/16 1316 Last data filed at 05/06/16 0900  Gross per 24 hour  Intake              480 ml  Output                0 ml  Net              480 ml    General: Alert, awake, oriented x3, in no acute distress Neck:  JVP is normal Heart: Regular rate and rhythm, without murmurs, rubs, gallops.  Lungs: Clear to auscultation.  No rales or wheezes. Exemities:  No edema.   Neuro: Grossly intact, nonfocal.  Tele  SR    Lab Results: Results for orders placed or performed during the hospital encounter of 04/27/16 (from the past 24 hour(s))  Basic metabolic panel     Status: Abnormal   Collection Time: 05/06/16  4:45 AM  Result Value Ref Range   Sodium 137 135 - 145 mmol/L   Potassium 4.2 3.5 - 5.1 mmol/L   Chloride 103 101 - 111 mmol/L   CO2 24 22 - 32 mmol/L   Glucose, Bld 129 (H) 65 - 99 mg/dL   BUN 14 6 - 20 mg/dL   Creatinine, Ser 0.81 0.61 - 1.24 mg/dL   Calcium 8.9 8.9 - 10.3 mg/dL   GFR calc non Af Amer >60 >60 mL/min   GFR calc Af Amer >60 >60 mL/min   Anion gap 10 5 - 15  CBC     Status: Abnormal   Collection Time: 05/06/16  4:45 AM  Result Value Ref Range   WBC 12.7 (H) 4.0 - 10.5 K/uL   RBC 3.60 (L) 4.22 - 5.81 MIL/uL   Hemoglobin 10.6 (L) 13.0 - 17.0 g/dL   HCT 32.0 (L) 39.0 - 52.0 %   MCV 88.9 78.0 - 100.0 fL   MCH 29.4 26.0 - 34.0 pg   MCHC 33.1 30.0 - 36.0 g/dL   RDW 14.0 11.5 - 15.5 %   Platelets 420 (H) 150 - 400 K/uL    Studies/Results: No results found.  Medications: Reviewed  @PROBHOSP @  1  CAD  S/p STEMI  S/p DES to RCA  Conservative RX to Ostial  LCx (90%)   Slow recovery but doing much better   2.  Rhythm  No further pauses  3  HTN  BP is up at times    Pt is progressing with PT/rehaab  Walked down hall  Await further recomm for PT for rehab    LOS: 9 days   Dorris Carnes 05/06/2016, 1:16 PM

## 2016-05-07 LAB — BASIC METABOLIC PANEL
ANION GAP: 9 (ref 5–15)
BUN: 15 mg/dL (ref 6–20)
CALCIUM: 8.8 mg/dL — AB (ref 8.9–10.3)
CHLORIDE: 104 mmol/L (ref 101–111)
CO2: 24 mmol/L (ref 22–32)
Creatinine, Ser: 0.74 mg/dL (ref 0.61–1.24)
GFR calc non Af Amer: >60 mL/min (ref >60)
Glucose, Bld: 110 mg/dL — ABNORMAL HIGH (ref 65–99)
POTASSIUM: 3.7 mmol/L (ref 3.5–5.1)
Sodium: 137 mmol/L (ref 135–145)

## 2016-05-07 LAB — CBC
HCT: 30.9 % — ABNORMAL LOW (ref 39.0–52.0)
HEMOGLOBIN: 10.1 g/dL — AB (ref 13.0–17.0)
MCH: 29.2 pg (ref 26.0–34.0)
MCHC: 32.7 g/dL (ref 30.0–36.0)
MCV: 89.3 fL (ref 78.0–100.0)
PLATELETS: 474 10*3/uL — AB (ref 150–400)
RBC: 3.46 MIL/uL — AB (ref 4.22–5.81)
RDW: 14.1 % (ref 11.5–15.5)
WBC: 12.3 10*3/uL — ABNORMAL HIGH (ref 4.0–10.5)

## 2016-05-07 MED ORDER — ATORVASTATIN CALCIUM 80 MG PO TABS: 80.0000 mg | ORAL_TABLET | Freq: Every day | ORAL | 12 refills | Status: DC

## 2016-05-07 MED ORDER — CLOPIDOGREL BISULFATE 75 MG PO TABS: 75.0000 mg | ORAL_TABLET | Freq: Every day | ORAL | 12 refills | Status: DC

## 2016-05-07 MED ORDER — PHENOBARBITAL 97.2 MG PO TABS: 97.2000 mg | ORAL_TABLET | Freq: Every evening | ORAL | 4 refills | Status: DC

## 2016-05-07 MED ORDER — MAGNESIUM HYDROXIDE 400 MG/5ML PO SUSP: 30.0000 mL | Freq: Every day | ORAL | 0 refills | Status: DC | PRN

## 2016-05-07 MED ORDER — PHENOBARBITAL 64.8 MG PO TABS: 64.8000 mg | ORAL_TABLET | Freq: Every morning | ORAL | 4 refills | Status: DC

## 2016-05-07 MED ORDER — ASPIRIN EC 81 MG PO TBEC: 81.0000 mg | DELAYED_RELEASE_TABLET | Freq: Every day | ORAL | Status: DC

## 2016-05-07 NOTE — Discharge Summary (Deleted)
Discharge Summary    Patient ID: Joshua [name],  MRN: [id number], DOB/AGE: [birth year] 82 y.o.  Admit date: 04/27/2016 Discharge date: 05/07/2016  Primary Care Provider: Redge [name] Primary Cardiologist: Joshua [name]  Discharge Diagnoses    Principal Problem:   ST elevation (STEMI) myocardial infarction involving other coronary artery of inferior wall [facility]) Active Problems:   Hypercholesterolemia   Essential hypertension   AV block, Mobitz 2   STEMI (ST elevation myocardial infarction) ([place])   Cardiogenic shock (HCC)   1St degree AV block   Pyrexia   Leukocytosis   Allergies Allergies  Allergen Reactions  . Penicillins Rash    Has patient had a PCN reaction causing immediate rash, facial/tongue/throat swelling, SOB or lightheadedness with hypotension: Yes Has patient had a PCN reaction causing severe rash involving mucus membranes or skin necrosis: No Has patient had a PCN reaction that required hospitalization No Has patient had a PCN reaction occurring within the last 10 years: No If all of the above answers are "NO", then may proceed with Cephalosporin use.     Diagnostic Studies/Procedures   Coronary Stent Intervention 04/27/16  Left Heart Cath and Coronary Angiography  Temporary Pacemaker   Ost Cx to Mid Cx lesion, 90 %stenosed.  Ost 1st Mrg to 1st Mrg lesion, 50 %stenosed.  A STENT RESOLUTE INTEG [id number] drug eluting stent was successfully placed, and does not overlap previously placed stent.  Mid RCA to Dist RCA lesion, 100 %stenosed.  Post intervention, there is a 0% residual stenosis.  A STENT RESOLUTE INTEG [id number] drug eluting stent was successfully placed, and does not overlap previously placed stent.  Prox RCA lesion, 80 %stenosed.  Post intervention, there is a 0% residual stenosis.  The left ventricular ejection fraction is 45-50% by visual estimate.  The left ventricular systolic function is normal.    Acute inferior ST elevation  myocardial infarction, complicated by Mobitz 2 second degree heart block with heart rate of 29 bpm and hypotension.  Successful insertion of temporary pacemaker wire for symptomatic bradycardia.  Total occlusion of the mid RCA reduced to 0% after drug-eluting stent implantation using a Resolute Integrity 2.75 x 30 mm drug-eluting stent postdilated to 3.0.  Successful angioplasty of the proximal right coronary from 80% to 0% with TIMI grade 3 flow using a resolute Integrity 3.0 x 18 mm DES postdilated to 3.25 mm in diameter.  Segmental 60% distal left main and 50% ostial involvement.  90% ostial to proximal circumflex containing thrombus.  Otherwise widely patent circumflex and LAD vessels.  Inferior wall akinesis. EF 50% with low LVEDP.   RECOMMENDATION:  Continue temporary transvenous pacemaker until recovery of AV conduction.  Aggrastat 18 hours.  Aspirin and Brilinta.  Hopeful conservative management for 4-6 weeks then consider re-study to look at the circumflex coronary artery (may be required prior to discharge if recurrent anginal/ischemic symptoms) and make a decision at that time about the possibility of coronary bypass grafting for left main disease.   _____________   History of Present Illness   Joshua [name] is a 82 y.o. male with a history of seizure disorder, prostate cancer, hypertension, hyperlipidemia, GERD and no prior cardiac history who presented to [name] [place] today via EMS as a code STEMI.  On 04/19/16 he underwent left bladder neck tumor removal by [facility] [name].   The patient woke up to have a bowel movement on the morning of 04/27/16 and shortly after started to feel weak and diaphoretic. He had  no chest pain or shortness of breath. EMS was called and initial EKG showed heart rates in the 30s with inferior ST elevation as well as reciprocal changes. His heart rate has sustained in the 20s and 30s and currently he is in second-degree heart block.   [name] Course  He was taken urgently to the cath lab. He received PTCA and drug-eluting stenting of the mid right proximal right coronary artery that was 100% occluded. Full cath report above. Has residual disease in his proximal circumflex. He will be on Plavix and aspirin for at least one year. Plan is to consider another heart catheterization to look at the circumflex coronary artery and make a decision about the possibility of coronary bypass grafting for left main disease.  Continued to be bradycardic postprocedure and required temporary pacemaker as his heart rate was dropping into the 30s. He also had some hematuria during admission. This is often his hemoglobin remained stable throughout admission.  On 05/01/2016 he developed a fever and leukocytosis. Also on the same day he had a vagal episode associated with a long pause. Dr. physiology consult was obtained. It was felt that he was not in need of a permanent pacemaker as his bradycardia was a result of some manifestation of AV nodal ischemia consistent with his inferior wall MI.  Regarding his leukocytosis, urine and blood cultures were negative. There was some concern about aspiration pneumonia and a swallow evaluation was obtained from speech and language therapist. He had some intermittent cough with swallowing and underwent modified barium swallow study on 05/04/2016. He had mild aspiration risk. Treatment was recommended at that time. Regular solids and thin liquids are okay. It is suggested that he take his medications hole in a pured substance.  His bradycardia resolved and he was able to increase his activity with cardiac rehabilitation. She remained deconditioned and was seen by physical therapy. It was recommended that she go to a skilled nursing facility for rehabilitation.  Continue aspirin and Plavix and statin drug. No beta blocker was prescribed due to sinus pauses during hospitalization which have resolved. Can consider  adding at a later date. Patient needs to have Mobitz 1 second-degree AV block but with no bradycardia.  She was seen today by Joshua [name] and deemed suitable for discharge to skilled nursing facility. Patient will follow-up in our office in 2 weeks. _____________  Discharge Vitals Blood pressure (!) 119/52, pulse 60, temperature 98.8 F (37.1 C), temperature source Oral, resp. rate 15, height 6' (1.829 m), weight 180 lb 6.4 oz (81.8 kg), SpO2 96 %.  Filed Weights   04/27/16 1337 05/06/16 0555 05/07/16 0500  Weight: 190 lb 4.1 oz (86.3 kg) 181 lb 14.4 oz (82.5 kg) 180 lb 6.4 oz (81.8 kg)    Labs & Radiologic Studies     CBC  Recent Labs  05/06/16 0445 05/07/16 0350  WBC 12.7* 12.3*  HGB 10.6* 10.1*  HCT 32.0* 30.9*  MCV 88.9 89.3  PLT 420* 123XX123*   Basic Metabolic Panel  Recent Labs  05/06/16 0445 05/07/16 0350  NA 137 137  K 4.2 3.7  CL 103 104  CO2 24 24  GLUCOSE 129* 110*  BUN 14 15  CREATININE 0.81 0.74  CALCIUM 8.9 8.8*    Dg Chest 2 View  Result Date: 05/02/2016 CLINICAL DATA:  Cardiac arrhythmia.  Fever Syncope. EXAM: CHEST  2 VIEW COMPARISON:  05/01/2016 FINDINGS: Cardiomegaly. Calcified tortuous aorta. No active infiltrates or failure. Mild hyperinflation suggesting COPD. Improved interstitial prominence  compared with yesterday's radiograph. Osteopenia. IMPRESSION: Cardiomegaly. Lung fields now essentially clear. No active infiltrates or failure. Electronically Signed   By: Staci Righter M.D.   On: 05/02/2016 19:24   Dg Chest Port 1 View  Result Date: 05/01/2016 CLINICAL DATA:  82 year old male with fever. Initial encounter. EXAM: PORTABLE CHEST 1 VIEW COMPARISON:  04/02/2015 and earlier. FINDINGS: Portable AP semi upright view at 1455 hours. Stable cardiomegaly and mediastinal contours. Chronic but increased appearing widespread pulmonary interstitial opacity. No pneumothorax, pleural effusion or confluent pulmonary opacity. Calcified aortic atherosclerosis.  Visualized tracheal air column is within normal limits. IMPRESSION: Acute on chronic increased interstitial opacity in both lungs. Favor viral/atypical respiratory infection in this setting. Main differential consideration is pulmonary interstitial edema. Electronically Signed   By: Genevie Ann M.D.   On: 05/01/2016 15:15   Dg Swallowing Func-speech Pathology  Result Date: 05/04/2016 Objective Swallowing Evaluation: Type of Study: MBS-Modified Barium Swallow Study Patient Details Name: KENZELL LARY MRN: [id number] Date of Birth: 1932-08-02 Today's Date: 05/04/2016 Time: SLP Start Time (ACUTE ONLY): 1040-SLP Stop Time (ACUTE ONLY): 1112 SLP Time Calculation (min) (ACUTE ONLY): 32 min Past Medical History: Past Medical History: Diagnosis Date . Allergy   pcn . Arteriovenous malformation   ([place]) . Blood transfusion   right eye  no surgery as yet . ED (erectile dysfunction)  . GERD (gastroesophageal reflux disease)  . Hypercholesterolemia  . Hypertension  . Prostate cancer ([place]) 11/02/11 bxs  Adenocarcinoma,Gleason=3+3=6 & 3+4=7,PSA+1.58 Volume=19cc . Seizures ([place])  Past Surgical History: Past Surgical History: Procedure Laterality Date . APPENDECTOMY   . CARDIAC CATHETERIZATION N/A 04/27/2016  Procedure: Left Heart Cath and Coronary Angiography;  Surgeon: Belva Crome, MD;  Location: [facility];  Service: Cardiovascular;  Laterality: N/A; . CARDIAC CATHETERIZATION N/A 04/27/2016  Procedure: Temporary Pacemaker;  Surgeon: Belva Crome, MD;  Location: [facility];  Service: Cardiovascular;  Laterality: N/A; . CARDIAC CATHETERIZATION N/A 04/27/2016  Procedure: Coronary Stent Intervention;  Surgeon: Belva Crome, MD;  Location: [facility];  Service: Cardiovascular;  Laterality: N/A;  Proximal RCA Mid RCA . CRANIOTOMY  1980 . CYSTOSCOPY  01/19/2012  Procedure: CYSTOSCOPY;  Surgeon: Malka So, MD;  Location: [facility];  Service: Urology;;  no seeds found in bladder . CYSTOSCOPY W/  RETROGRADES Bilateral 04/19/2016  Procedure: CYSTOSCOPY WITH BILATERAL RETROGRADE PYELOGRAM TRANSURETHRAL RESECTION OF BLADDER TUMOR ;  Surgeon: [name], MD;  Location: [facility];  Service: Urology;  Laterality: Bilateral; . RADIOACTIVE SEED IMPLANT  01/19/2012  Procedure: RADIOACTIVE SEED IMPLANT;  Surgeon: Malka So, MD;  Location: [name];  Service: Urology;  Laterality: N/A;  44 seeds implanted HPI: 82 y/o gentleman with PMG significant for seizure disorder, AVM, HTN, GERD. Presented as code STEMI. Swallow assessment ordered secondary to concern for possible aspiration with temp spikes and increased WBC.  Subjective: Pleasant, hard working. Assessment / Plan / Recommendation CHL IP CLINICAL IMPRESSIONS 05/04/2016 Therapy Diagnosis Mild pharyngeal phase dysphagia Clinical Impression Pt presents with mild pharyngeal phase dysphagia characterized by mild pharyngeal delay with flash penetration of thins and difficulty coordinating meds with liquids. Pt also has generalized pharyngeal weakness resulting in mild residue with all consistencies. Pt was able to manage residue [facility] with self-initiated double swallow with all boluses. Recommend: Regular diet with thins, meds whole with puree, upright positioning with all PO, slow rate of intake and small bites/sips. Pt to take rest breaks as needed when fatigued. Pt/family and  RN all verbalized agreement with recommendations. Will sign off at this time.  Impact on safety and function Mild aspiration risk   CHL IP TREATMENT RECOMMENDATION 05/04/2016 Treatment Recommendations No treatment recommended at this time   Prognosis 05/04/2016 Prognosis for Safe Diet Advancement Good Barriers to Reach Goals -- Barriers/Prognosis Comment -- CHL IP DIET RECOMMENDATION 05/04/2016 SLP Diet Recommendations Regular solids;Thin liquid Liquid Administration via Cup;Straw Medication Administration Whole meds with puree Compensations Slow rate;Small sips/bites Postural Changes  Seated upright at 90 degrees;Remain semi-upright after after feeds/meals (Comment)   CHL IP OTHER RECOMMENDATIONS 05/04/2016 Recommended Consults -- Oral Care Recommendations Oral care QID Other Recommendations --   CHL IP FOLLOW UP RECOMMENDATIONS 05/04/2016 Follow up Recommendations None   No flowsheet data found.     CHL IP ORAL PHASE 05/04/2016 Oral Phase WFL Oral - Pudding Teaspoon -- Oral - Pudding Cup -- Oral - Honey Teaspoon -- Oral - Honey Cup -- Oral - Nectar Teaspoon -- Oral - Nectar Cup -- Oral - Nectar Straw -- Oral - Thin Teaspoon -- Oral - Thin Cup -- Oral - Thin Straw -- Oral - Puree -- Oral - Mech Soft -- Oral - Regular -- Oral - Multi-Consistency -- Oral - Pill -- Oral Phase - Comment --  CHL IP PHARYNGEAL PHASE 05/04/2016 Pharyngeal Phase Impaired Pharyngeal- Pudding Teaspoon -- Pharyngeal -- Pharyngeal- Pudding Cup -- Pharyngeal -- Pharyngeal- Honey Teaspoon -- Pharyngeal -- Pharyngeal- Honey Cup -- Pharyngeal -- Pharyngeal- Nectar Teaspoon -- Pharyngeal -- Pharyngeal- Nectar Cup -- Pharyngeal -- Pharyngeal- Nectar Straw -- Pharyngeal -- Pharyngeal- Thin Teaspoon -- Pharyngeal -- Pharyngeal- Thin Cup Delayed swallow initiation-vallecula;Penetration/Aspiration before swallow;Pharyngeal residue - pyriform Pharyngeal Material enters airway, remains ABOVE vocal cords then ejected out Pharyngeal- Thin Straw Delayed swallow initiation-vallecula;Penetration/Aspiration before swallow;Pharyngeal residue - pyriform Pharyngeal Material enters airway, remains ABOVE vocal cords then ejected out Pharyngeal- Puree -- Pharyngeal -- Pharyngeal- Mechanical Soft -- Pharyngeal -- Pharyngeal- Regular -- Pharyngeal -- Pharyngeal- Multi-consistency -- Pharyngeal -- Pharyngeal- Pill Pharyngeal residue - pyriform Pharyngeal -- Pharyngeal Comment --  No flowsheet data found. No flowsheet data found. Vinetta Bergamo MA, CCC-SLP 05/04/2016, 11:38 AM               Disposition   Pt is being discharged home today in good  condition.  Follow-up Plans & Appointments    Contact information for after-discharge care    Destination    [facility] .   Specialty:  [place] information: [address] [place] [phone]             Discharge Instructions    Amb Referral to Cardiac Rehabilitation    Complete by:  As directed   Diagnosis:   STEMI Coronary Stents        Discharge Medications   Current Discharge Medication List    CONTINUE these medications which have NOT CHANGED   Details  calcium carbonate (OS-CAL) 600 MG TABS tablet Take 600 mg by mouth daily with breakfast.    Multiple Vitamins-Minerals (ICAPS AREDS FORMULA PO) Take 1-2 capsules by mouth 2 (two) times daily. Take 1 capsule in the morning, and 2 capsules in evening    pantoprazole (PROTONIX) 40 MG tablet TAKE 1 TABLET ONCE DAILY 30 MINUTES BEFORE a MEAL. Qty: 90 tablet, Refills: 3    PHENobarbital (LUMINAL) 32.4 MG tablet Take 2 tabs in am, 3 tabs pm per pt statement Qty: 450 tablet, Refills: 3    phenytoin (DILANTIN)  100 MG ER capsule 2 caps in the morning and 3 caps QHS Qty: 450 capsule, Refills: 3    pravastatin (PRAVACHOL) 80 MG tablet Take 1 tablet (80 mg total) by mouth daily. Qty: 90 tablet, Refills: 0    VITAMIN D, ERGOCALCIFEROL, PO Take 600 Units by mouth daily.     HYDROcodone-acetaminophen (NORCO) 5-325 MG tablet Take 1 tablet by mouth every 6 (six) hours as needed for moderate pain. Qty: 10 tablet, Refills: 0         Aspirin prescribed at discharge?  Yes High Intensity Statin Prescribed? (Lipitor 40-80mg  or Crestor 20-40mg ): Yes Beta Blocker Prescribed? No: Bradycardia For EF 40% or less, Was ACEI/ARB Prescribed? No: EF 50% ADP Receptor Inhibitor Prescribed? (i.e. Plavix etc.-Includes Medically Managed Patients): Yes For EF <40%, Aldosterone Inhibitor Prescribed? No: EF 50% Was EF assessed during THIS hospitalization? Yes Was  Cardiac Rehab II ordered? (Included Medically managed Patients): Yes   Outstanding Labs/Studies   Echo, BMP  Duration of Discharge Encounter   Greater than 30 minutes including physician time.  Signed, Arbutus Leas NP 05/07/2016, 2:24 PM

## 2016-05-07 NOTE — Clinical Social Work Note (Signed)
Clinical Social Worker met with patient family who states that they have changed their mind and would prefer [facility].  CSW explained that [facility] will not do a new weekend discharge and will need to pursue [facility] due to discharge order already being placed.  After long discussion, patient and family agreeable to [facility] with plans to transfer to [facility] next week.  Patient family in contact with family friend who is a MD to further discuss patient case.  Clinical Social Worker received notification that MD/PA has rescinded patient discharge and PA will notify family.  CSW attempted to notify [facility], however Lelan Pons, has left for the day.  CSW to follow up tomorrow regarding patient discharge plans.  CSW remains available for support and to facilitate patient discharge needs.  Barbette Or, [place]

## 2016-05-07 NOTE — Clinical Social Work Note (Signed)
Patient and daughter chose [facility] for placement. The patient can DC to the facility over the weekend if ready. Please contact [facility] weekend CSW at [phone] to facilitate DC.  Liz Beach MSW, [place], [place], [id number]

## 2016-05-07 NOTE — Progress Notes (Addendum)
SUBJECTIVE:  No complaints  OBJECTIVE:   Vitals:   Vitals:   05/06/16 1300 05/06/16 1942 05/07/16 0500 05/07/16 1139  BP: 134/71 135/63 (!) 147/65 (!) 118/50  Pulse: 71 67 64 69  Resp: 17 16 18 16   Temp: 97.7 F (36.5 C) 98.7 F (37.1 C) 99.4 F (37.4 C) 98.8 F (37.1 C)  TempSrc: Oral Oral Oral Oral  SpO2: 96% 95% 92% 96%  Weight:   180 lb 6.4 oz (81.8 kg)   Height:       I&O's:   Intake/Output Summary (Last 24 hours) at 05/07/16 1200 Last data filed at 05/07/16 [id number]  Gross per 24 hour  Intake              720 ml  Output             1050 ml  Net             -330 ml   TELEMETRY: Reviewed telemetry pt in NSR:     PHYSICAL EXAM General: Well developed, well nourished, in no acute distress Head: Eyes PERRLA, No xanthomas.   Normal cephalic and atramatic  Lungs:   Clear bilaterally to auscultation and percussion. Heart:   HRRR S1 S2 Pulses are 2+ & equal. Abdomen: Bowel sounds are positive, abdomen soft and non-tender without masses  Msk:  Back normal, normal gait. Normal strength and tone for age. Extremities:   No clubbing, cyanosis or edema.  DP +1 Neuro: Alert and oriented X 3. Psych:  Good affect, responds appropriately   LABS: Basic Metabolic Panel:  Recent Labs  05/06/16 0445 05/07/16 0350  NA 137 137  K 4.2 3.7  CL 103 104  CO2 24 24  GLUCOSE 129* 110*  BUN 14 15  CREATININE 0.81 0.74  CALCIUM 8.9 8.8*   Liver Function Tests: No results for input(s): AST, ALT, ALKPHOS, BILITOT, PROT, ALBUMIN in the last 72 hours. No results for input(s): LIPASE, AMYLASE in the last 72 hours. CBC:  Recent Labs  05/06/16 0445 05/07/16 0350  WBC 12.7* 12.3*  HGB 10.6* 10.1*  HCT 32.0* 30.9*  MCV 88.9 89.3  PLT 420* 474*   Cardiac Enzymes: No results for input(s): CKTOTAL, CKMB, CKMBINDEX, TROPONINI in the last 72 hours. BNP: Invalid input(s): POCBNP D-Dimer: No results for input(s): DDIMER in the last 72 hours. Hemoglobin A1C: No results for  input(s): HGBA1C in the last 72 hours. Fasting Lipid Panel: No results for input(s): CHOL, HDL, LDLCALC, TRIG, CHOLHDL, LDLDIRECT in the last 72 hours. Thyroid Function Tests: No results for input(s): TSH, T4TOTAL, T3FREE, THYROIDAB in the last 72 hours.  Invalid input(s): FREET3 Anemia Panel: No results for input(s): VITAMINB12, FOLATE, FERRITIN, TIBC, IRON, RETICCTPCT in the last 72 hours. Coag Panel:   Lab Results  Component Value Date   INR 1.15 04/27/2016   INR 0.98 01/19/2012    RADIOLOGY: Dg Chest 2 View  Result Date: 05/02/2016 CLINICAL DATA:  Cardiac arrhythmia.  Fever Syncope. EXAM: CHEST  2 VIEW COMPARISON:  05/01/2016 FINDINGS: Cardiomegaly. Calcified tortuous aorta. No active infiltrates or failure. Mild hyperinflation suggesting COPD. Improved interstitial prominence compared with yesterday's radiograph. Osteopenia. IMPRESSION: Cardiomegaly. Lung fields now essentially clear. No active infiltrates or failure. Electronically Signed   By: Staci Righter M.D.   On: 05/02/2016 19:24   Dg Chest Port 1 View  Result Date: 05/01/2016 CLINICAL DATA:  82 year old male with fever. Initial encounter. EXAM: PORTABLE CHEST 1 VIEW COMPARISON:  04/02/2015 and earlier. FINDINGS: Portable AP  semi upright view at 1455 hours. Stable cardiomegaly and mediastinal contours. Chronic but increased appearing widespread pulmonary interstitial opacity. No pneumothorax, pleural effusion or confluent pulmonary opacity. Calcified aortic atherosclerosis. Visualized tracheal air column is within normal limits. IMPRESSION: Acute on chronic increased interstitial opacity in both lungs. Favor viral/atypical respiratory infection in this setting. Main differential consideration is pulmonary interstitial edema. Electronically Signed   By: Genevie Ann M.D.   On: 05/01/2016 15:15   Dg Swallowing Func-speech Pathology  Result Date: 05/04/2016 Objective Swallowing Evaluation: Type of Study: MBS-Modified Barium Swallow  Study Patient Details Name: Joshua [name] MRN: [id number] Date of Birth: November 26, 1931 Today's Date: 05/04/2016 Time: SLP Start Time (ACUTE ONLY): 1040-SLP Stop Time (ACUTE ONLY): 1112 SLP Time Calculation (min) (ACUTE ONLY): 32 min Past Medical History: Past Medical History: Diagnosis Date . Allergy   pcn . Arteriovenous malformation   (CNS) . Blood transfusion   right eye  no surgery as yet . ED (erectile dysfunction)  . GERD (gastroesophageal reflux disease)  . Hypercholesterolemia  . Hypertension  . Prostate cancer ([place]) 11/02/11 bxs  Adenocarcinoma,Gleason=3+3=6 & 3+4=7,PSA+1.58 Volume=19cc . Seizures ([place])  Past Surgical History: Past Surgical History: Procedure Laterality Date . APPENDECTOMY   . CARDIAC CATHETERIZATION N/A 04/27/2016  Procedure: Left Heart Cath and Coronary Angiography;  Surgeon: Belva Crome, MD;  Location: [place] [facility];  Service: Cardiovascular;  Laterality: N/A; . CARDIAC CATHETERIZATION N/A 04/27/2016  Procedure: Temporary Pacemaker;  Surgeon: Belva Crome, MD;  Location: [place] [facility];  Service: Cardiovascular;  Laterality: N/A; . CARDIAC CATHETERIZATION N/A 04/27/2016  Procedure: Coronary Stent Intervention;  Surgeon: Belva Crome, MD;  Location: [place] [facility];  Service: Cardiovascular;  Laterality: N/A;  Proximal RCA Mid RCA . CRANIOTOMY  1980 . CYSTOSCOPY  01/19/2012  Procedure: CYSTOSCOPY;  Surgeon: Malka So, MD;  Location: [facility];  Service: Urology;;  no seeds found in bladder . CYSTOSCOPY W/ RETROGRADES Bilateral 04/19/2016  Procedure: CYSTOSCOPY WITH BILATERAL RETROGRADE PYELOGRAM TRANSURETHRAL RESECTION OF BLADDER TUMOR ;  Surgeon: [name], MD;  Location: [facility];  Service: Urology;  Laterality: Bilateral; . RADIOACTIVE SEED IMPLANT  01/19/2012  Procedure: RADIOACTIVE SEED IMPLANT;  Surgeon: Malka So, MD;  Location: [facility];  Service: Urology;  Laterality: N/A;  60 seeds implanted HPI: 82 y/o gentleman with PMG significant for  seizure disorder, AVM, HTN, GERD. Presented as code STEMI. Swallow assessment ordered secondary to concern for possible aspiration with temp spikes and increased WBC.  Subjective: Pleasant, hard working. Assessment / Plan / Recommendation CHL IP CLINICAL IMPRESSIONS 05/04/2016 Therapy Diagnosis Mild pharyngeal phase dysphagia Clinical Impression Pt presents with mild pharyngeal phase dysphagia characterized by mild pharyngeal delay with flash penetration of thins and difficulty coordinating meds with liquids. Pt also has generalized pharyngeal weakness resulting in mild residue with all consistencies. Pt was able to manage residue [facility] with self-initiated double swallow with all boluses. Recommend: Regular diet with thins, meds whole with puree, upright positioning with all PO, slow rate of intake and small bites/sips. Pt to take rest breaks as needed when fatigued. Pt/family and RN all verbalized agreement with recommendations. Will sign off at this time.  Impact on safety and function Mild aspiration risk   CHL IP TREATMENT RECOMMENDATION 05/04/2016 Treatment Recommendations No treatment recommended at this time   Prognosis 05/04/2016 Prognosis for Safe Diet Advancement Good Barriers to Reach Goals -- Barriers/Prognosis Comment -- CHL IP DIET RECOMMENDATION 05/04/2016 SLP Diet Recommendations Regular solids;Thin  liquid Liquid Administration via Cup;Straw Medication Administration Whole meds with puree Compensations Slow rate;Small sips/bites Postural Changes Seated upright at 90 degrees;Remain semi-upright after after feeds/meals (Comment)   CHL IP OTHER RECOMMENDATIONS 05/04/2016 Recommended Consults -- Oral Care Recommendations Oral care QID Other Recommendations --   CHL IP FOLLOW UP RECOMMENDATIONS 05/04/2016 Follow up Recommendations None   No flowsheet data found.     CHL IP ORAL PHASE 05/04/2016 Oral Phase WFL Oral - Pudding Teaspoon -- Oral - Pudding Cup -- Oral - Honey Teaspoon -- Oral - Honey Cup -- Oral -  Nectar Teaspoon -- Oral - Nectar Cup -- Oral - Nectar Straw -- Oral - Thin Teaspoon -- Oral - Thin Cup -- Oral - Thin Straw -- Oral - Puree -- Oral - Mech Soft -- Oral - Regular -- Oral - Multi-Consistency -- Oral - Pill -- Oral Phase - Comment --  CHL IP PHARYNGEAL PHASE 05/04/2016 Pharyngeal Phase Impaired Pharyngeal- Pudding Teaspoon -- Pharyngeal -- Pharyngeal- Pudding Cup -- Pharyngeal -- Pharyngeal- Honey Teaspoon -- Pharyngeal -- Pharyngeal- Honey Cup -- Pharyngeal -- Pharyngeal- Nectar Teaspoon -- Pharyngeal -- Pharyngeal- Nectar Cup -- Pharyngeal -- Pharyngeal- Nectar Straw -- Pharyngeal -- Pharyngeal- Thin Teaspoon -- Pharyngeal -- Pharyngeal- Thin Cup Delayed swallow initiation-vallecula;Penetration/Aspiration before swallow;Pharyngeal residue - pyriform Pharyngeal Material enters airway, remains ABOVE vocal cords then ejected out Pharyngeal- Thin Straw Delayed swallow initiation-vallecula;Penetration/Aspiration before swallow;Pharyngeal residue - pyriform Pharyngeal Material enters airway, remains ABOVE vocal cords then ejected out Pharyngeal- Puree -- Pharyngeal -- Pharyngeal- Mechanical Soft -- Pharyngeal -- Pharyngeal- Regular -- Pharyngeal -- Pharyngeal- Multi-consistency -- Pharyngeal -- Pharyngeal- Pill Pharyngeal residue - pyriform Pharyngeal -- Pharyngeal Comment --  No flowsheet data found. No flowsheet data found. Vinetta Bergamo MA, CCC-SLP 05/04/2016, 11:38 AM                 ASSESSMENT:PLAN:    1.  CAD  S/p STEMI  S/p DES to RCA  Conservative RX to Ostial LCx (90%)   Slow recovery but doing much better  Continue ASA/Plavix/statin.  No BB due to sinus pauses which have resolved.  2. Mobitz 1 Second degree AV block - persists but no significant bradycardia  3  HTN - BP is controlled  Pt is progressing with PT/rehaab  Walked down hall  Await further recomm for PT for rehab and SNF placement  Fransico Him, MD  05/07/2016  12:00 PM

## 2016-05-08 LAB — BASIC METABOLIC PANEL
ANION GAP: 8 (ref 5–15)
BUN: 14 mg/dL (ref 6–20)
CO2: 24 mmol/L (ref 22–32)
Calcium: 8.9 mg/dL (ref 8.9–10.3)
Chloride: 105 mmol/L (ref 101–111)
Creatinine, Ser: 0.76 mg/dL (ref 0.61–1.24)
Glucose, Bld: 123 mg/dL — ABNORMAL HIGH (ref 65–99)
POTASSIUM: 3.7 mmol/L (ref 3.5–5.1)
SODIUM: 137 mmol/L (ref 135–145)

## 2016-05-08 LAB — CBC
HEMATOCRIT: 31.3 % — AB (ref 39.0–52.0)
Hemoglobin: 10.2 g/dL — ABNORMAL LOW (ref 13.0–17.0)
MCH: 29.1 pg (ref 26.0–34.0)
MCHC: 32.6 g/dL (ref 30.0–36.0)
MCV: 89.4 fL (ref 78.0–100.0)
PLATELETS: 528 10*3/uL — AB (ref 150–400)
RBC: 3.5 MIL/uL — AB (ref 4.22–5.81)
RDW: 14.1 % (ref 11.5–15.5)
WBC: 14.1 10*3/uL — AB (ref 4.0–10.5)

## 2016-05-08 NOTE — Progress Notes (Signed)
SUBJECTIVE:  No complaints  OBJECTIVE:   Vitals:   Vitals:   05/07/16 1139 05/07/16 1328 05/07/16 2009 05/08/16 0500  BP: (!) 118/50 (!) 119/52 140/63 127/60  Pulse: 69 60 69 67  Resp: 16 15 16 16   Temp: 98.8 F (37.1 C) 98.8 F (37.1 C) 98.4 F (36.9 C) 98.9 F (37.2 C)  TempSrc: Oral Oral Oral Oral  SpO2: 96% 96% 98% 92%  Weight:      Height:       I&O's:   Intake/Output Summary (Last 24 hours) at 05/08/16 1012 Last data filed at 05/08/16 0834  Gross per 24 hour  Intake              240 ml  Output              375 ml  Net             -135 ml   TELEMETRY: Reviewed telemetry pt in NSR with intermittent second degree Type I AV block and episodes of Mobitz II second degree AV block around 9am with HR in low 40's.     PHYSICAL EXAM General: Well developed, well nourished, in no acute distress Head: Eyes PERRLA, No xanthomas.   Normal cephalic and atramatic  Lungs:   Clear bilaterally to auscultation and percussion. Heart:   HRRR S1 S2 Pulses are 2+ & equal. Abdomen: Bowel sounds are positive, abdomen soft and non-tender without masses  Msk:  Back normal, normal gait. Normal strength and tone for age. Extremities:   No clubbing, cyanosis or edema.  DP +1 Neuro: Alert and oriented X 3. Psych:  Good affect, responds appropriately   LABS: Basic Metabolic Panel:  Recent Labs  05/07/16 0350 05/08/16 0413  NA 137 137  K 3.7 3.7  CL 104 105  CO2 24 24  GLUCOSE 110* 123*  BUN 15 14  CREATININE 0.74 0.76  CALCIUM 8.8* 8.9   Liver Function Tests: No results for input(s): AST, ALT, ALKPHOS, BILITOT, PROT, ALBUMIN in the last 72 hours. No results for input(s): LIPASE, AMYLASE in the last 72 hours. CBC:  Recent Labs  05/07/16 0350 05/08/16 0413  WBC 12.3* 14.1*  HGB 10.1* 10.2*  HCT 30.9* 31.3*  MCV 89.3 89.4  PLT 474* 528*   Cardiac Enzymes: No results for input(s): CKTOTAL, CKMB, CKMBINDEX, TROPONINI in the last 72 hours. BNP: Invalid input(s):  POCBNP D-Dimer: No results for input(s): DDIMER in the last 72 hours. Hemoglobin A1C: No results for input(s): HGBA1C in the last 72 hours. Fasting Lipid Panel: No results for input(s): CHOL, HDL, LDLCALC, TRIG, CHOLHDL, LDLDIRECT in the last 72 hours. Thyroid Function Tests: No results for input(s): TSH, T4TOTAL, T3FREE, THYROIDAB in the last 72 hours.  Invalid input(s): FREET3 Anemia Panel: No results for input(s): VITAMINB12, FOLATE, FERRITIN, TIBC, IRON, RETICCTPCT in the last 72 hours. Coag Panel:   Lab Results  Component Value Date   INR 1.15 04/27/2016   INR 0.98 01/19/2012    RADIOLOGY: Dg Chest 2 View  Result Date: 05/02/2016 CLINICAL DATA:  Cardiac arrhythmia.  Fever Syncope. EXAM: CHEST  2 VIEW COMPARISON:  05/01/2016 FINDINGS: Cardiomegaly. Calcified tortuous aorta. No active infiltrates or failure. Mild hyperinflation suggesting COPD. Improved interstitial prominence compared with yesterday's radiograph. Osteopenia. IMPRESSION: Cardiomegaly. Lung fields now essentially clear. No active infiltrates or failure. Electronically Signed   By: Staci Righter M.D.   On: 05/02/2016 19:24   Dg Chest Port 1 View  Result Date: 05/01/2016 CLINICAL DATA:  82 year old male with fever. Initial encounter. EXAM: PORTABLE CHEST 1 VIEW COMPARISON:  04/02/2015 and earlier. FINDINGS: Portable AP semi upright view at 1455 hours. Stable cardiomegaly and mediastinal contours. Chronic but increased appearing widespread pulmonary interstitial opacity. No pneumothorax, pleural effusion or confluent pulmonary opacity. Calcified aortic atherosclerosis. Visualized tracheal air column is within normal limits. IMPRESSION: Acute on chronic increased interstitial opacity in both lungs. Favor viral/atypical respiratory infection in this setting. Main differential consideration is pulmonary interstitial edema. Electronically Signed   By: Genevie Ann M.D.   On: 05/01/2016 15:15   Dg Swallowing Func-speech  Pathology  Result Date: 05/04/2016 Objective Swallowing Evaluation: Type of Study: MBS-Modified Barium Swallow Study Patient Details Name: Joshua [name] MRN: [id number] Date of Birth: [birth year] Today's Date: 05/04/2016 Time: SLP Start Time (ACUTE ONLY): 1040-SLP Stop Time (ACUTE ONLY): 1112 SLP Time Calculation (min) (ACUTE ONLY): 32 min Past Medical History: Past Medical History: Diagnosis Date . Allergy   pcn . Arteriovenous malformation   ([place]) . Blood transfusion   right eye  no surgery as yet . ED (erectile dysfunction)  . GERD (gastroesophageal reflux disease)  . Hypercholesterolemia  . Hypertension  . Prostate cancer ([place]) 11/02/11 bxs  Adenocarcinoma,Gleason=3+3=6 & 3+4=7,PSA+1.58 Volume=19cc . Seizures ([place])  Past Surgical History: Past Surgical History: Procedure Laterality Date . APPENDECTOMY   . CARDIAC CATHETERIZATION N/A 04/27/2016  Procedure: Left Heart Cath and Coronary Angiography;  Surgeon: Belva Crome, MD;  Location: [place] [facility];  Service: Cardiovascular;  Laterality: N/A; . CARDIAC CATHETERIZATION N/A 04/27/2016  Procedure: Temporary Pacemaker;  Surgeon: Belva Crome, MD;  Location: [place] [facility];  Service: Cardiovascular;  Laterality: N/A; . CARDIAC CATHETERIZATION N/A 04/27/2016  Procedure: Coronary Stent Intervention;  Surgeon: Belva Crome, MD;  Location: [place] [facility];  Service: Cardiovascular;  Laterality: N/A;  Proximal RCA Mid RCA . CRANIOTOMY  1980 . CYSTOSCOPY  01/19/2012  Procedure: CYSTOSCOPY;  Surgeon: Malka So, MD;  Location: [facility];  Service: Urology;;  no seeds found in bladder . CYSTOSCOPY W/ RETROGRADES Bilateral 04/19/2016  Procedure: CYSTOSCOPY WITH BILATERAL RETROGRADE PYELOGRAM TRANSURETHRAL RESECTION OF BLADDER TUMOR ;  Surgeon: [name], MD;  Location: [facility];  Service: Urology;  Laterality: Bilateral; . RADIOACTIVE SEED IMPLANT  01/19/2012  Procedure: RADIOACTIVE SEED IMPLANT;  Surgeon: Malka So, MD;  Location: [facility];  Service: Urology;  Laterality: N/A;  80 seeds implanted HPI: 82 y/o gentleman with PMG significant for seizure disorder, AVM, HTN, GERD. Presented as code STEMI. Swallow assessment ordered secondary to concern for possible aspiration with temp spikes and increased WBC.  Subjective: Pleasant, hard working. Assessment / Plan / Recommendation CHL IP CLINICAL IMPRESSIONS 05/04/2016 Therapy Diagnosis Mild pharyngeal phase dysphagia Clinical Impression Pt presents with mild pharyngeal phase dysphagia characterized by mild pharyngeal delay with flash penetration of thins and difficulty coordinating meds with liquids. Pt also has generalized pharyngeal weakness resulting in mild residue with all consistencies. Pt was able to manage residue [facility] with self-initiated double swallow with all boluses. Recommend: Regular diet with thins, meds whole with puree, upright positioning with all PO, slow rate of intake and small bites/sips. Pt to take rest breaks as needed when fatigued. Pt/family and RN all verbalized agreement with recommendations. Will sign off at this time.  Impact on safety and function Mild aspiration risk   CHL IP TREATMENT RECOMMENDATION 05/04/2016 Treatment Recommendations No treatment recommended at this time   Prognosis 05/04/2016 Prognosis for Safe Diet Advancement  Good Barriers to Reach Goals -- Barriers/Prognosis Comment -- CHL IP DIET RECOMMENDATION 05/04/2016 SLP Diet Recommendations Regular solids;Thin liquid Liquid Administration via Cup;Straw Medication Administration Whole meds with puree Compensations Slow rate;Small sips/bites Postural Changes Seated upright at 90 degrees;Remain semi-upright after after feeds/meals (Comment)   CHL IP OTHER RECOMMENDATIONS 05/04/2016 Recommended Consults -- Oral Care Recommendations Oral care QID Other Recommendations --   CHL IP FOLLOW UP RECOMMENDATIONS 05/04/2016 Follow up Recommendations None   No flowsheet data found.     CHL IP ORAL PHASE 05/04/2016 Oral  Phase WFL Oral - Pudding Teaspoon -- Oral - Pudding Cup -- Oral - Honey Teaspoon -- Oral - Honey Cup -- Oral - Nectar Teaspoon -- Oral - Nectar Cup -- Oral - Nectar Straw -- Oral - Thin Teaspoon -- Oral - Thin Cup -- Oral - Thin Straw -- Oral - Puree -- Oral - Mech Soft -- Oral - Regular -- Oral - Multi-Consistency -- Oral - Pill -- Oral Phase - Comment --  CHL IP PHARYNGEAL PHASE 05/04/2016 Pharyngeal Phase Impaired Pharyngeal- Pudding Teaspoon -- Pharyngeal -- Pharyngeal- Pudding Cup -- Pharyngeal -- Pharyngeal- Honey Teaspoon -- Pharyngeal -- Pharyngeal- Honey Cup -- Pharyngeal -- Pharyngeal- Nectar Teaspoon -- Pharyngeal -- Pharyngeal- Nectar Cup -- Pharyngeal -- Pharyngeal- Nectar Straw -- Pharyngeal -- Pharyngeal- Thin Teaspoon -- Pharyngeal -- Pharyngeal- Thin Cup Delayed swallow initiation-vallecula;Penetration/Aspiration before swallow;Pharyngeal residue - pyriform Pharyngeal Material enters airway, remains ABOVE vocal cords then ejected out Pharyngeal- Thin Straw Delayed swallow initiation-vallecula;Penetration/Aspiration before swallow;Pharyngeal residue - pyriform Pharyngeal Material enters airway, remains ABOVE vocal cords then ejected out Pharyngeal- Puree -- Pharyngeal -- Pharyngeal- Mechanical Soft -- Pharyngeal -- Pharyngeal- Regular -- Pharyngeal -- Pharyngeal- Multi-consistency -- Pharyngeal -- Pharyngeal- Pill Pharyngeal residue - pyriform Pharyngeal -- Pharyngeal Comment --  No flowsheet data found. No flowsheet data found. Vinetta Bergamo MA, CCC-SLP 05/04/2016, 11:38 AM               ASSESSMENT:PLAN:    1.  CAD S/p STEMI S/p DES to RCA Conservative RX to Ostial LCx (90%)  Slow recovery but doing much better  Continue ASA/Plavix/statin.  No BB due to sinus pauses .  2. Mobitz 1 Second degree AV block - and some Type II this am around 9 with HR in low 40's.  Patient is asymptomatic.  Will have EP see patient.    3 HTN - BP is controlled  Pt is progressing with Cardiac Rehab.  Walked down hall .  Family does not want patient to go to [facility].  They want him to go to [facility] so will await further recs from Social Work.     Fransico Him, MD  05/08/2016  10:12 AM

## 2016-05-09 LAB — BASIC METABOLIC PANEL
Anion gap: 8 (ref 5–15)
BUN: 14 mg/dL (ref 6–20)
CALCIUM: 8.9 mg/dL (ref 8.9–10.3)
CO2: 24 mmol/L (ref 22–32)
CREATININE: 0.77 mg/dL (ref 0.61–1.24)
Chloride: 105 mmol/L (ref 101–111)
Glucose, Bld: 112 mg/dL — ABNORMAL HIGH (ref 65–99)
Potassium: 3.8 mmol/L (ref 3.5–5.1)
SODIUM: 137 mmol/L (ref 135–145)

## 2016-05-09 LAB — CBC
HCT: 32.8 % — ABNORMAL LOW (ref 39.0–52.0)
Hemoglobin: 10.4 g/dL — ABNORMAL LOW (ref 13.0–17.0)
MCH: 28.7 pg (ref 26.0–34.0)
MCHC: 31.7 g/dL (ref 30.0–36.0)
MCV: 90.4 fL (ref 78.0–100.0)
PLATELETS: 575 10*3/uL — AB (ref 150–400)
RBC: 3.63 MIL/uL — ABNORMAL LOW (ref 4.22–5.81)
RDW: 14.2 % (ref 11.5–15.5)
WBC: 14.3 10*3/uL — AB (ref 4.0–10.5)

## 2016-05-09 NOTE — Progress Notes (Signed)
Pt cont to have dark red urine. Pt states has not been clear since started back on plavix and asaprin. Pt is also on SQ heparin. Cont to monitor. Carroll Kinds RN

## 2016-05-09 NOTE — Progress Notes (Signed)
Physical Therapy Treatment Patient Details Name: Joshua [name] MRN: [id number] DOB: [birth year] Today's Date: 05/09/2016    History of Present Illness 82 y.o. male admitted to [facility] on 04/27/16 for N/V, weakness, brady cardia with second degree heart block.  Found to have a STEMI.  Pt s/p PCI/DES to RCA on 04/27/16.  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      PT Comments    Patient progressing well towards PT goals. Requires assist to stand from low surfaces due to weakness. Endurance and activity tolerance seems to be improved as pt tolerated increased ambulation distance without seated rest break. VSS throughout session. Pt motivated to mobilize. Will continue to follow.   Follow Up Recommendations  SNF     Equipment Recommendations  Rolling walker with 5" wheels    Recommendations for Other Services       Precautions / Restrictions Precautions Precautions: Fall Restrictions Weight Bearing Restrictions: No    Mobility  Bed Mobility               General bed mobility comments: Up in chair upon PT arrival.  Transfers Overall transfer level: Needs assistance Equipment used: Rolling walker (2 wheeled) Transfers: Sit to/from Stand Sit to Stand: Min assist         General transfer comment: Assist to boost from low chair. Good demo of hand placement.   Ambulation/Gait Ambulation/Gait assistance: Min guard Ambulation Distance (Feet): 200 Feet Assistive device: Rolling walker (2 wheeled) Gait Pattern/deviations: Step-through pattern;Trunk flexed Gait velocity: decreased   General Gait Details: Slow, steady gait. VC's for upright posture. 2 standing rest breaks. HR stable.    Stairs            Wheelchair Mobility    Modified Rankin (Stroke Patients Only)       Balance Overall balance assessment: Needs assistance Sitting-balance support: Feet supported;No upper extremity supported Sitting balance-Leahy Scale:  Fair Sitting balance - Comments: Able to doff/donn socks with increased time and assist bringing LEs across opposite thigh. Assist with donning shoes due to swelling.   Standing balance support: During functional activity Standing balance-Leahy Scale: Poor                      Cognition Arousal/Alertness: Awake/alert Behavior During Therapy: WFL for tasks assessed/performed Overall Cognitive Status: Within Functional Limits for tasks assessed                      Exercises      General Comments        Pertinent Vitals/Pain Pain Assessment: No/denies pain    Home Living                      Prior Function            PT Goals (current goals can now be found in the care plan section) Progress towards PT goals: Progressing toward goals    Frequency  Min 3X/week    PT Plan Current plan remains appropriate    Co-evaluation             End of Session Equipment Utilized During Treatment: Gait belt Activity Tolerance: Patient tolerated treatment well Patient left: in chair;with call bell/phone within reach;with chair alarm set;with family/visitor present     Time: 1033-1100 PT Time Calculation (min) (ACUTE ONLY): 27 min  Charges:  $Gait Training: 8-22 mins $Therapeutic Activity: 8-22 mins  G Codes:      Lacie Draft 05/09/2016, [place], [place], DPT [phone]

## 2016-05-09 NOTE — Progress Notes (Signed)
Daughter states that she would like to accept the offer from [facility].  SW spoke with nurse who states that she has consulted with physician and the pt will be d/c tomorrow.  Tilda Burrow, Education officer, museum

## 2016-05-09 NOTE — Care Management Note (Signed)
Case Management Note  Patient Details  Name: PASCHAL GIOVANNONI MRN: [id number] Date of Birth: November 17, 1931  Subjective/Objective: Pt presented for [place]. Plan for SNF at [facility] 05-10-16                   Action/Plan: CSW assisting with disposition needs. No further needs from CM at this time.   Expected Discharge Date:  04/30/16               Expected Discharge Plan:  Skilled Nursing Facility  In-House Referral:  Clinical Social Work  Discharge planning Services  CM Consult  Post Acute Care Choice:    Choice offered to:  NA  DME Arranged:  N/A DME Agency:  NA  HH Arranged:  NA HH Agency:  NA  Status of Service:  Completed, signed off  If discussed at [employer] of Stay Meetings, dates discussed:    Additional Comments:  Bethena Roys, RN 05/09/2016, 5:05 PM

## 2016-05-09 NOTE — Care Management Important Message (Signed)
Important Message  Patient Details  Name: Joshua [name] MRN: [id number] Date of Birth: Jan 05, 1932   Medicare Important Message Given:  Yes    Jessica Checketts [place] 05/09/2016, 11:15 AM

## 2016-05-09 NOTE — Progress Notes (Signed)
CARDIAC REHAB PHASE I   PRE:  Rate/Rhythm: 72 SR first deg    BP: sitting 118/61    SaO2: 94 RA  MODE:  Ambulation: 500 ft   POST:  Rate/Rhythm: 92 SR    BP: sitting 145/70     SaO2: 94 RA  Pt just walked one hr ago but eager to walk again. Able to stand with min assist x2 and use RW for long distance. Tired toward end with unbalance. To recliner.  [place], ACSM 05/09/2016 12:05 PM

## 2016-05-09 NOTE — Progress Notes (Signed)
Report received in patient's room via [facility] RN using SBAR format, reviewed orders, labs, VS, meds and patient's general condition, assumed care of patient.

## 2016-05-09 NOTE — Progress Notes (Signed)
SUBJECTIVE:  No chest pain or dyspnea OBJECTIVE:   Vitals:   Vitals:   05/08/16 0500 05/08/16 1514 05/08/16 2222 05/09/16 0500  BP: 127/60 (!) 122/53 (!) 126/47 125/66  Pulse: 67 64 66 67  Resp: 16 18    Temp: 98.9 F (37.2 C) 97.6 F (36.4 C) 98.1 F (36.7 C) 98.2 F (36.8 C)  TempSrc: Oral Oral Oral Oral  SpO2: 92% 97% 100% 93%  Weight:    177 lb 9.6 oz (80.6 kg)  Height:       I&O's:    Intake/Output Summary (Last 24 hours) at 05/09/16 1132 Last data filed at 05/09/16 0900  Gross per 24 hour  Intake              120 ml  Output             1125 ml  Net            -1005 ml   TELEMETRY: Reviewed telemetry pt in NSR with intermittent second degree Type I AV block and episodes of Mobitz II second degree AV block around 9am with HR in low 40's.     PHYSICAL EXAM General: Well developed, well nourished, in no acute distress Head: Normal Lungs:   CTA Heart:   RRR Abdomen: soft and non-tender without masses  Extremities:   No edema.   Neuro: Alert and oriented X 3. Psych:  Good affect, responds appropriately   LABS: Basic Metabolic Panel:  Recent Labs  05/08/16 0413 05/09/16 0528  NA 137 137  K 3.7 3.8  CL 105 105  CO2 24 24  GLUCOSE 123* 112*  BUN 14 14  CREATININE 0.76 0.77  CALCIUM 8.9 8.9   CBC:  Recent Labs  05/08/16 0413 05/09/16 0528  WBC 14.1* 14.3*  HGB 10.2* 10.4*  HCT 31.3* 32.8*  MCV 89.4 90.4  PLT 528* 575*   Coag Panel:   Lab Results  Component Value Date   INR 1.15 04/27/2016   INR 0.98 01/19/2012    RADIOLOGY: Dg Chest 2 View  Result Date: 05/02/2016 CLINICAL DATA:  Cardiac arrhythmia.  Fever Syncope. EXAM: CHEST  2 VIEW COMPARISON:  05/01/2016 FINDINGS: Cardiomegaly. Calcified tortuous aorta. No active infiltrates or failure. Mild hyperinflation suggesting COPD. Improved interstitial prominence compared with yesterday's radiograph. Osteopenia. IMPRESSION: Cardiomegaly. Lung fields now essentially clear. No active  infiltrates or failure. Electronically Signed   By: Staci Righter M.D.   On: 05/02/2016 19:24   Dg Chest Port 1 View  Result Date: 05/01/2016 CLINICAL DATA:  82 year old male with fever. Initial encounter. EXAM: PORTABLE CHEST 1 VIEW COMPARISON:  04/02/2015 and earlier. FINDINGS: Portable AP semi upright view at 1455 hours. Stable cardiomegaly and mediastinal contours. Chronic but increased appearing widespread pulmonary interstitial opacity. No pneumothorax, pleural effusion or confluent pulmonary opacity. Calcified aortic atherosclerosis. Visualized tracheal air column is within normal limits. IMPRESSION: Acute on chronic increased interstitial opacity in both lungs. Favor viral/atypical respiratory infection in this setting. Main differential consideration is pulmonary interstitial edema. Electronically Signed   By: Genevie Ann M.D.   On: 05/01/2016 15:15   Dg Swallowing Func-speech Pathology  Result Date: 05/04/2016 Objective Swallowing Evaluation: Type of Study: MBS-Modified Barium Swallow Study Patient Details Name: Joshua [name] MRN: [id number] Date of Birth: 1932-08-18 Today's Date: 05/04/2016 Time: SLP Start Time (ACUTE ONLY): 1040-SLP Stop Time (ACUTE ONLY): 1112 SLP Time Calculation (min) (ACUTE ONLY): 32 min Past Medical History: Past Medical History: Diagnosis Date . Allergy  pcn . Arteriovenous malformation   (CNS) . Blood transfusion   right eye  no surgery as yet . ED (erectile dysfunction)  . GERD (gastroesophageal reflux disease)  . Hypercholesterolemia  . Hypertension  . Prostate cancer ([place]) 11/02/11 bxs  Adenocarcinoma,Gleason=3+3=6 & 3+4=7,PSA+1.58 Volume=19cc . Seizures ([place])  Past Surgical History: Past Surgical History: Procedure Laterality Date . APPENDECTOMY   . CARDIAC CATHETERIZATION N/A 04/27/2016  Procedure: Left Heart Cath and Coronary Angiography;  Surgeon: Belva Crome, MD;  Location: [place] [facility];  Service: Cardiovascular;  Laterality: N/A; . CARDIAC CATHETERIZATION N/A  04/27/2016  Procedure: Temporary Pacemaker;  Surgeon: Belva Crome, MD;  Location: [place] [facility];  Service: Cardiovascular;  Laterality: N/A; . CARDIAC CATHETERIZATION N/A 04/27/2016  Procedure: Coronary Stent Intervention;  Surgeon: Belva Crome, MD;  Location: [place] [facility];  Service: Cardiovascular;  Laterality: N/A;  Proximal RCA Mid RCA . CRANIOTOMY  1980 . CYSTOSCOPY  01/19/2012  Procedure: CYSTOSCOPY;  Surgeon: Malka So, MD;  Location: [facility];  Service: Urology;;  no seeds found in bladder . CYSTOSCOPY W/ RETROGRADES Bilateral 04/19/2016  Procedure: CYSTOSCOPY WITH BILATERAL RETROGRADE PYELOGRAM TRANSURETHRAL RESECTION OF BLADDER TUMOR ;  Surgeon: [name], MD;  Location: [facility];  Service: Urology;  Laterality: Bilateral; . RADIOACTIVE SEED IMPLANT  01/19/2012  Procedure: RADIOACTIVE SEED IMPLANT;  Surgeon: Malka So, MD;  Location: [facility];  Service: Urology;  Laterality: N/A;  64 seeds implanted HPI: 82 y/o gentleman with PMG significant for seizure disorder, AVM, HTN, GERD. Presented as code STEMI. Swallow assessment ordered secondary to concern for possible aspiration with temp spikes and increased WBC.  Subjective: Pleasant, hard working. Assessment / Plan / Recommendation CHL IP CLINICAL IMPRESSIONS 05/04/2016 Therapy Diagnosis Mild pharyngeal phase dysphagia Clinical Impression Pt presents with mild pharyngeal phase dysphagia characterized by mild pharyngeal delay with flash penetration of thins and difficulty coordinating meds with liquids. Pt also has generalized pharyngeal weakness resulting in mild residue with all consistencies. Pt was able to manage residue [facility] with self-initiated double swallow with all boluses. Recommend: Regular diet with thins, meds whole with puree, upright positioning with all PO, slow rate of intake and small bites/sips. Pt to take rest breaks as needed when fatigued. Pt/family and RN all verbalized agreement with  recommendations. Will sign off at this time.  Impact on safety and function Mild aspiration risk   CHL IP TREATMENT RECOMMENDATION 05/04/2016 Treatment Recommendations No treatment recommended at this time   Prognosis 05/04/2016 Prognosis for Safe Diet Advancement Good Barriers to Reach Goals -- Barriers/Prognosis Comment -- CHL IP DIET RECOMMENDATION 05/04/2016 SLP Diet Recommendations Regular solids;Thin liquid Liquid Administration via Cup;Straw Medication Administration Whole meds with puree Compensations Slow rate;Small sips/bites Postural Changes Seated upright at 90 degrees;Remain semi-upright after after feeds/meals (Comment)   CHL IP OTHER RECOMMENDATIONS 05/04/2016 Recommended Consults -- Oral Care Recommendations Oral care QID Other Recommendations --   CHL IP FOLLOW UP RECOMMENDATIONS 05/04/2016 Follow up Recommendations None   No flowsheet data found.     CHL IP ORAL PHASE 05/04/2016 Oral Phase WFL Oral - Pudding Teaspoon -- Oral - Pudding Cup -- Oral - Honey Teaspoon -- Oral - Honey Cup -- Oral - Nectar Teaspoon -- Oral - Nectar Cup -- Oral - Nectar Straw -- Oral - Thin Teaspoon -- Oral - Thin Cup -- Oral - Thin Straw -- Oral - Puree -- Oral - Mech Soft -- Oral - Regular -- Oral - Multi-Consistency --  Oral - Pill -- Oral Phase - Comment --  CHL IP PHARYNGEAL PHASE 05/04/2016 Pharyngeal Phase Impaired Pharyngeal- Pudding Teaspoon -- Pharyngeal -- Pharyngeal- Pudding Cup -- Pharyngeal -- Pharyngeal- Honey Teaspoon -- Pharyngeal -- Pharyngeal- Honey Cup -- Pharyngeal -- Pharyngeal- Nectar Teaspoon -- Pharyngeal -- Pharyngeal- Nectar Cup -- Pharyngeal -- Pharyngeal- Nectar Straw -- Pharyngeal -- Pharyngeal- Thin Teaspoon -- Pharyngeal -- Pharyngeal- Thin Cup Delayed swallow initiation-vallecula;Penetration/Aspiration before swallow;Pharyngeal residue - pyriform Pharyngeal Material enters airway, remains ABOVE vocal cords then ejected out Pharyngeal- Thin Straw Delayed swallow  initiation-vallecula;Penetration/Aspiration before swallow;Pharyngeal residue - pyriform Pharyngeal Material enters airway, remains ABOVE vocal cords then ejected out Pharyngeal- Puree -- Pharyngeal -- Pharyngeal- Mechanical Soft -- Pharyngeal -- Pharyngeal- Regular -- Pharyngeal -- Pharyngeal- Multi-consistency -- Pharyngeal -- Pharyngeal- Pill Pharyngeal residue - pyriform Pharyngeal -- Pharyngeal Comment --  No flowsheet data found. No flowsheet data found. Vinetta Bergamo MA, CCC-SLP 05/04/2016, 11:38 AM               ASSESSMENT:PLAN:    1.  CAD S/p STEMI S/p DES to RCA Conservative RX to Ostial LCx (90%) and LM; FU with Dr Tamala Julian as outpt for consideration of relook cath. Continue ASA/Plavix/statin.  No BB due to bradycardia  2. Mobitz 1 Second degree AV block -continues with Mobitz 1 with occasional junctional escape beats; no symptoms; fu telemetry; outpt holter in 4-6 weeks if no more significant bradycardia.  3 HTN - BP is controlled  Pt for DC when rehab available possibly in AM.   Kirk Ruths, MD  05/09/2016  11:32 AM

## 2016-05-10 ENCOUNTER — Encounter (HOSPITAL_COMMUNITY): Payer: Self-pay | Admitting: Cardiology

## 2016-05-10 DIAGNOSIS — I2510 Atherosclerotic heart disease of native coronary artery without angina pectoris: Secondary | ICD-10-CM | POA: Diagnosis not present

## 2016-05-10 DIAGNOSIS — E559 Vitamin D deficiency, unspecified: Secondary | ICD-10-CM | POA: Diagnosis not present

## 2016-05-10 DIAGNOSIS — R52 Pain, unspecified: Secondary | ICD-10-CM | POA: Diagnosis not present

## 2016-05-10 DIAGNOSIS — Z8551 Personal history of malignant neoplasm of bladder: Secondary | ICD-10-CM | POA: Diagnosis not present

## 2016-05-10 DIAGNOSIS — R079 Chest pain, unspecified: Secondary | ICD-10-CM | POA: Diagnosis not present

## 2016-05-10 DIAGNOSIS — E7800 Pure hypercholesterolemia, unspecified: Secondary | ICD-10-CM | POA: Diagnosis not present

## 2016-05-10 DIAGNOSIS — R1313 Dysphagia, pharyngeal phase: Secondary | ICD-10-CM | POA: Diagnosis not present

## 2016-05-10 DIAGNOSIS — Z955 Presence of coronary angioplasty implant and graft: Secondary | ICD-10-CM | POA: Diagnosis not present

## 2016-05-10 DIAGNOSIS — R278 Other lack of coordination: Secondary | ICD-10-CM | POA: Diagnosis not present

## 2016-05-10 DIAGNOSIS — Z9181 History of falling: Secondary | ICD-10-CM | POA: Diagnosis not present

## 2016-05-10 DIAGNOSIS — I440 Atrioventricular block, first degree: Secondary | ICD-10-CM | POA: Diagnosis not present

## 2016-05-10 DIAGNOSIS — I110 Hypertensive heart disease with heart failure: Secondary | ICD-10-CM | POA: Diagnosis not present

## 2016-05-10 DIAGNOSIS — R569 Unspecified convulsions: Secondary | ICD-10-CM | POA: Diagnosis not present

## 2016-05-10 DIAGNOSIS — R310 Gross hematuria: Secondary | ICD-10-CM | POA: Diagnosis not present

## 2016-05-10 DIAGNOSIS — R001 Bradycardia, unspecified: Secondary | ICD-10-CM | POA: Diagnosis not present

## 2016-05-10 DIAGNOSIS — I10 Essential (primary) hypertension: Secondary | ICD-10-CM | POA: Diagnosis not present

## 2016-05-10 DIAGNOSIS — K219 Gastro-esophageal reflux disease without esophagitis: Secondary | ICD-10-CM | POA: Diagnosis not present

## 2016-05-10 DIAGNOSIS — Z8546 Personal history of malignant neoplasm of prostate: Secondary | ICD-10-CM | POA: Diagnosis not present

## 2016-05-10 DIAGNOSIS — I2119 ST elevation (STEMI) myocardial infarction involving other coronary artery of inferior wall: Secondary | ICD-10-CM | POA: Diagnosis not present

## 2016-05-10 DIAGNOSIS — M6281 Muscle weakness (generalized): Secondary | ICD-10-CM | POA: Diagnosis not present

## 2016-05-10 DIAGNOSIS — K5900 Constipation, unspecified: Secondary | ICD-10-CM | POA: Diagnosis not present

## 2016-05-10 DIAGNOSIS — E785 Hyperlipidemia, unspecified: Secondary | ICD-10-CM | POA: Diagnosis not present

## 2016-05-10 DIAGNOSIS — Z7982 Long term (current) use of aspirin: Secondary | ICD-10-CM | POA: Diagnosis not present

## 2016-05-10 DIAGNOSIS — I213 ST elevation (STEMI) myocardial infarction of unspecified site: Secondary | ICD-10-CM | POA: Diagnosis not present

## 2016-05-10 DIAGNOSIS — I2111 ST elevation (STEMI) myocardial infarction involving right coronary artery: Secondary | ICD-10-CM | POA: Diagnosis not present

## 2016-05-10 DIAGNOSIS — I441 Atrioventricular block, second degree: Secondary | ICD-10-CM | POA: Diagnosis not present

## 2016-05-10 LAB — BASIC METABOLIC PANEL
Anion gap: 7 (ref 5–15)
BUN: 15 mg/dL (ref 6–20)
CHLORIDE: 105 mmol/L (ref 101–111)
CO2: 25 mmol/L (ref 22–32)
CREATININE: 0.76 mg/dL (ref 0.61–1.24)
Calcium: 9.1 mg/dL (ref 8.9–10.3)
GFR calc Af Amer: >60 mL/min (ref >60)
GFR calc non Af Amer: >60 mL/min (ref >60)
Glucose, Bld: 110 mg/dL — ABNORMAL HIGH (ref 65–99)
Potassium: 3.8 mmol/L (ref 3.5–5.1)
SODIUM: 137 mmol/L (ref 135–145)

## 2016-05-10 LAB — CBC
HCT: 33.3 % — ABNORMAL LOW (ref 39.0–52.0)
Hemoglobin: 10.6 g/dL — ABNORMAL LOW (ref 13.0–17.0)
MCH: 28.6 pg (ref 26.0–34.0)
MCHC: 31.8 g/dL (ref 30.0–36.0)
MCV: 90 fL (ref 78.0–100.0)
PLATELETS: 602 10*3/uL — AB (ref 150–400)
RBC: 3.7 MIL/uL — AB (ref 4.22–5.81)
RDW: 14.1 % (ref 11.5–15.5)
WBC: 13.7 10*3/uL — AB (ref 4.0–10.5)

## 2016-05-10 MED ORDER — NITROGLYCERIN 0.4 MG SL SUBL: 0.4000 mg | SUBLINGUAL_TABLET | SUBLINGUAL | 3 refills | Status: DC | PRN

## 2016-05-10 NOTE — Progress Notes (Signed)
SW spoke with [place] who confirms that pt is welcomed to come to facility. She states that she has no other questions or needs from SW regarding pt at this time. SW informed her that daughter will be transporting pt to facility.  Daughter confirms that she will transport pt to facility and sign the pt in once she arrives. Daughter states she has no questions at this time. Daughter is accepting and appreciative.  Tilda Burrow, Social Worker [phone]

## 2016-05-10 NOTE — Progress Notes (Signed)
CARDIAC REHAB PHASE I   PRE:  Rate/Rhythm: 68 SR   BP:  Supine:   Sitting: 113/63  Standing:    SaO2: 95%RA  MODE:  Ambulation: 500 ft   POST:  Rate/Rhythm: 86  BP:  Supine:   Sitting: 132/61  Standing:    SaO2: 93%RA 1140-1200 Pt walked 500 ft with rolling walker, gait belt and asst x 2 with slow steady gait. Does better with his shoes on. To recliner after walk.  A little unsteady once when turning. Very motivated. No questions re previous ed done.   Graylon Good, RN BSN  05/10/2016 11:58 AM

## 2016-05-10 NOTE — Progress Notes (Signed)
SUBJECTIVE:  No chest pain or dyspnea; hematuria but no dysuria or hesitancy OBJECTIVE:   Vitals:   Vitals:   05/09/16 0500 05/09/16 1350 05/09/16 2125 05/10/16 0500  BP: 125/66 125/71 132/60 (!) 121/58  Pulse: 67 68 68 70  Resp:   16   Temp: 98.2 F (36.8 C) 97.8 F (36.6 C) 98.4 F (36.9 C) 98.4 F (36.9 C)  TempSrc: Oral Oral Oral Oral  SpO2: 93% 97% 100% 96%  Weight: 177 lb 9.6 oz (80.6 kg)   176 lb 11.2 oz (80.2 kg)  Height:       I&O's:    Intake/Output Summary (Last 24 hours) at 05/10/16 [id number] Last data filed at 05/10/16 0810  Gross per 24 hour  Intake              240 ml  Output             1275 ml  Net            -1035 ml   TELEMETRY: Reviewed telemetry pt in NSR with intermittent second degree Type I AV block     PHYSICAL EXAM General: Well developed, well nourished, in no acute distress Head: Normal Neck: supple Lungs:   CTA Heart:   RRR Abdomen: soft and non-tender without masses  Extremities:   No edema.   Neuro: Alert and oriented X 3. Psych:  Good affect, responds appropriately   LABS: Basic Metabolic Panel:  Recent Labs  05/09/16 0528 05/10/16 0407  NA 137 137  K 3.8 3.8  CL 105 105  CO2 24 25  GLUCOSE 112* 110*  BUN 14 15  CREATININE 0.77 0.76  CALCIUM 8.9 9.1   CBC:  Recent Labs  05/09/16 0528 05/10/16 0407  WBC 14.3* 13.7*  HGB 10.4* 10.6*  HCT 32.8* 33.3*  MCV 90.4 90.0  PLT 575* 602*   Coag Panel:   Lab Results  Component Value Date   INR 1.15 04/27/2016   INR 0.98 01/19/2012    RADIOLOGY: Dg Chest 2 View  Result Date: 05/02/2016 CLINICAL DATA:  Cardiac arrhythmia.  Fever Syncope. EXAM: CHEST  2 VIEW COMPARISON:  05/01/2016 FINDINGS: Cardiomegaly. Calcified tortuous aorta. No active infiltrates or failure. Mild hyperinflation suggesting COPD. Improved interstitial prominence compared with yesterday's radiograph. Osteopenia. IMPRESSION: Cardiomegaly. Lung fields now essentially clear. No active infiltrates or  failure. Electronically Signed   By: Staci Righter M.D.   On: 05/02/2016 19:24   Dg Chest Port 1 View  Result Date: 05/01/2016 CLINICAL DATA:  82 year old male with fever. Initial encounter. EXAM: PORTABLE CHEST 1 VIEW COMPARISON:  04/02/2015 and earlier. FINDINGS: Portable AP semi upright view at 1455 hours. Stable cardiomegaly and mediastinal contours. Chronic but increased appearing widespread pulmonary interstitial opacity. No pneumothorax, pleural effusion or confluent pulmonary opacity. Calcified aortic atherosclerosis. Visualized tracheal air column is within normal limits. IMPRESSION: Acute on chronic increased interstitial opacity in both lungs. Favor viral/atypical respiratory infection in this setting. Main differential consideration is pulmonary interstitial edema. Electronically Signed   By: Genevie Ann M.D.   On: 05/01/2016 15:15   Dg Swallowing Func-speech Pathology  Result Date: 05/04/2016 Objective Swallowing Evaluation: Type of Study: MBS-Modified Barium Swallow Study Patient Details Name: Joshua [name] MRN: [id number] Date of Birth: 07-Nov-1931 Today's Date: 05/04/2016 Time: SLP Start Time (ACUTE ONLY): 1040-SLP Stop Time (ACUTE ONLY): 1112 SLP Time Calculation (min) (ACUTE ONLY): 32 min Past Medical History: Past Medical History: Diagnosis Date . Allergy   pcn . Arteriovenous malformation   (  CNS) . Blood transfusion   right eye  no surgery as yet . ED (erectile dysfunction)  . GERD (gastroesophageal reflux disease)  . Hypercholesterolemia  . Hypertension  . Prostate cancer ([place]) 11/02/11 bxs  Adenocarcinoma,Gleason=3+3=6 & 3+4=7,PSA+1.58 Volume=19cc . Seizures ([place])  Past Surgical History: Past Surgical History: Procedure Laterality Date . APPENDECTOMY   . CARDIAC CATHETERIZATION N/A 04/27/2016  Procedure: Left Heart Cath and Coronary Angiography;  Surgeon: Belva Crome, MD;  Location: [place] [facility];  Service: Cardiovascular;  Laterality: N/A; . CARDIAC CATHETERIZATION N/A 04/27/2016   Procedure: Temporary Pacemaker;  Surgeon: Belva Crome, MD;  Location: [place] [facility];  Service: Cardiovascular;  Laterality: N/A; . CARDIAC CATHETERIZATION N/A 04/27/2016  Procedure: Coronary Stent Intervention;  Surgeon: Belva Crome, MD;  Location: [place] [facility];  Service: Cardiovascular;  Laterality: N/A;  Proximal RCA Mid RCA . CRANIOTOMY  1980 . CYSTOSCOPY  01/19/2012  Procedure: CYSTOSCOPY;  Surgeon: Malka So, MD;  Location: [facility];  Service: Urology;;  no seeds found in bladder . CYSTOSCOPY W/ RETROGRADES Bilateral 04/19/2016  Procedure: CYSTOSCOPY WITH BILATERAL RETROGRADE PYELOGRAM TRANSURETHRAL RESECTION OF BLADDER TUMOR ;  Surgeon: [name], MD;  Location: [facility];  Service: Urology;  Laterality: Bilateral; . RADIOACTIVE SEED IMPLANT  01/19/2012  Procedure: RADIOACTIVE SEED IMPLANT;  Surgeon: Malka So, MD;  Location: [facility];  Service: Urology;  Laterality: N/A;  64 seeds implanted HPI: 82 y/o gentleman with PMG significant for seizure disorder, AVM, HTN, GERD. Presented as code STEMI. Swallow assessment ordered secondary to concern for possible aspiration with temp spikes and increased WBC.  Subjective: Pleasant, hard working. Assessment / Plan / Recommendation CHL IP CLINICAL IMPRESSIONS 05/04/2016 Therapy Diagnosis Mild pharyngeal phase dysphagia Clinical Impression Pt presents with mild pharyngeal phase dysphagia characterized by mild pharyngeal delay with flash penetration of thins and difficulty coordinating meds with liquids. Pt also has generalized pharyngeal weakness resulting in mild residue with all consistencies. Pt was able to manage residue [facility] with self-initiated double swallow with all boluses. Recommend: Regular diet with thins, meds whole with puree, upright positioning with all PO, slow rate of intake and small bites/sips. Pt to take rest breaks as needed when fatigued. Pt/family and RN all verbalized agreement with recommendations.  Will sign off at this time.  Impact on safety and function Mild aspiration risk   CHL IP TREATMENT RECOMMENDATION 05/04/2016 Treatment Recommendations No treatment recommended at this time   Prognosis 05/04/2016 Prognosis for Safe Diet Advancement Good Barriers to Reach Goals -- Barriers/Prognosis Comment -- CHL IP DIET RECOMMENDATION 05/04/2016 SLP Diet Recommendations Regular solids;Thin liquid Liquid Administration via Cup;Straw Medication Administration Whole meds with puree Compensations Slow rate;Small sips/bites Postural Changes Seated upright at 90 degrees;Remain semi-upright after after feeds/meals (Comment)   CHL IP OTHER RECOMMENDATIONS 05/04/2016 Recommended Consults -- Oral Care Recommendations Oral care QID Other Recommendations --   CHL IP FOLLOW UP RECOMMENDATIONS 05/04/2016 Follow up Recommendations None   No flowsheet data found.     CHL IP ORAL PHASE 05/04/2016 Oral Phase WFL Oral - Pudding Teaspoon -- Oral - Pudding Cup -- Oral - Honey Teaspoon -- Oral - Honey Cup -- Oral - Nectar Teaspoon -- Oral - Nectar Cup -- Oral - Nectar Straw -- Oral - Thin Teaspoon -- Oral - Thin Cup -- Oral - Thin Straw -- Oral - Puree -- Oral - Mech Soft -- Oral - Regular -- Oral - Multi-Consistency -- Oral - Pill -- Oral Phase -  Comment --  CHL IP PHARYNGEAL PHASE 05/04/2016 Pharyngeal Phase Impaired Pharyngeal- Pudding Teaspoon -- Pharyngeal -- Pharyngeal- Pudding Cup -- Pharyngeal -- Pharyngeal- Honey Teaspoon -- Pharyngeal -- Pharyngeal- Honey Cup -- Pharyngeal -- Pharyngeal- Nectar Teaspoon -- Pharyngeal -- Pharyngeal- Nectar Cup -- Pharyngeal -- Pharyngeal- Nectar Straw -- Pharyngeal -- Pharyngeal- Thin Teaspoon -- Pharyngeal -- Pharyngeal- Thin Cup Delayed swallow initiation-vallecula;Penetration/Aspiration before swallow;Pharyngeal residue - pyriform Pharyngeal Material enters airway, remains ABOVE vocal cords then ejected out Pharyngeal- Thin Straw Delayed swallow initiation-vallecula;Penetration/Aspiration before  swallow;Pharyngeal residue - pyriform Pharyngeal Material enters airway, remains ABOVE vocal cords then ejected out Pharyngeal- Puree -- Pharyngeal -- Pharyngeal- Mechanical Soft -- Pharyngeal -- Pharyngeal- Regular -- Pharyngeal -- Pharyngeal- Multi-consistency -- Pharyngeal -- Pharyngeal- Pill Pharyngeal residue - pyriform Pharyngeal -- Pharyngeal Comment --  No flowsheet data found. No flowsheet data found. Vinetta Bergamo MA, CCC-SLP 05/04/2016, 11:38 AM               ASSESSMENT:PLAN:    1.  CAD S/p STEMI S/p DES to RCA Conservative RX to Ostial LCx (90%) and LM; FU with Dr Tamala Julian as outpt for consideration of relook cath. Continue ASA/Plavix/statin.  No BB due to bradycardia  2. Mobitz 1 Second degree AV block -continues with Mobitz 1 with occasional junctional escape beats; asymptomatic; plan outpt holter in 4-6 weeks.  3 HTN - BP is controlled  4 hematuria-patient has developed hematuria since yesterday. He is not having dysuria or hesitancy. He is having no difficulties passing his urine. I discussed the case with Dr. Tammi Klippel of urology. Patient did have recent resection of large bladder tumor with instillation of epirubicin. He felt patient could be discharged and they will contact patient with outpatient follow-up appointment. If he develops difficulty urinating or clots he would need sooner follow-up. Note his hemoglobin is stable. Would plan to recheck hemoglobin in one week.  Note I would be very hesitant to discontinue Plavix given recent drug-eluting stent in patient with complicated inferior myocardial infarction.  Discharge to rehabilitation today. Follow-up with transition of care appointment in 7 days. Follow-up with Dr. Tamala Julian.  > 30 min PA and physician time D2  Kirk Ruths, MD  05/10/2016  9:33 AM

## 2016-05-10 NOTE — Progress Notes (Signed)
Pt discharged to facility Discharge instructions have been gone over with facility.  IV's removed. Pt given unit number and told to call if they have any concerns regarding their discharge instructions.

## 2016-05-10 NOTE — Discharge Instructions (Signed)
Heart Attack A heart attack (myocardial infarction, MI) causes damage to your heart that cannot be fixed. A heart attack can happen when a heart (coronary) artery becomes blocked or narrowed. This cuts off the blood supply and oxygen to your heart. When one or more of your coronary arteries becomes blocked, that area of your heart begins to die. This causes the pain you feel during a heart attack. Heart attack pain can also occur in one part of the body but be felt in another part of the body (referred pain). You may feel referred heart attack pain in your left arm, neck, or jaw. Pain may even be felt in the right arm. CAUSES  Many conditions can cause a heart attack. These include:   Atherosclerosis. This is when a fatty substance (plaque) gradually builds up in the arteries. This buildup can block or reduce the blood supply to one or more of the heart arteries.  A blood clot. A blood clot can develop suddenly when plaque breaks up (ruptures) within a heart artery. A blood clot can block the heart artery, which prevents blood flow to the heart.   Severe tightening (spasm) of the heart artery. This cuts off blood flow through the artery.  RISK FACTORS People at risk for heart attack usually have one or more of the following risk factors:   High blood pressure (hypertension).  High cholesterol.  Smoking.  Being male.  Being overweight or obese.  Older aged.   A family history of heart disease.  Lack of exercise.  Diabetes.  Stress.  Drinking too much alcohol.  Using illegal street drugs, such as cocaine and methamphetamines. SYMPTOMS  Heart attack symptoms can vary from person to person. Symptoms depend on factors like gender and age.   In both men and women, heart attack symptoms can include the following:   Chest pain. This may feel like crushing, squeezing, or a feeling of pressure.  Shortness of breath.  Heartburn or indigestion with or without vomiting,  shortness of breath, or sweating.  Sudden cold sweats.  Sudden light-headedness.  Upper back pain.   Women can have unique heart attack symptoms, such as:   Unexplained feelings of nervousness or anxiety.  Discomfort between the shoulder blades or upper back.  Tingling in the hands and arms.   Older people (of both genders) can have subtle heart attack symptoms, such as:   Sweating.  Shortness of breath.  General tiredness or not feeling well.  DIAGNOSIS  Diagnosing a heart attack involves several tests. They include:   An assessment of your vital signs. This includes checking your:  Heart rhythm.  Blood pressure.  Breathing rate.  Oxygen level.   An ECG (electrocardiogram) to measure the electrical activity of your heart.  Blood tests called cardiac markers. In these tests, blood is drawn at scheduled times to check for the specific proteins or enzymes released by damaged heart muscle.  A chest X-ray.  An echocardiogram to evaluate heart motion and blood flow.  Coronary angiography to look at the heart arteries.  TREATMENT  Treatment for a heart attack may include:   Medicine that breaks up or dissolves blood clots in the heart artery.  Angioplasty.  Cardiac stent placement.  Intra-aortic balloon pump therapy (IABP).  Open heart surgery (coronary artery bypass graft, CABG). HOME CARE INSTRUCTIONS  Take medicines only as directed by your health care provider. You may need to take medicine after a heart attack to:   Keep your blood from   clotting too easily.  Control your blood pressure.  Lower your cholesterol.  Control abnormal heart rhythms.   Do not take the following medicines unless your health care provider approves:  Nonsteroidal anti-inflammatory drugs (NSAIDs), such as ibuprofen, naproxen, or celecoxib.  Vitamin supplements that contain vitamin A, vitamin E, or both.  Hormone replacement therapy that contains estrogen with or  without progestin.  Make lifestyle changes as directed by your health care provider. These may include:   Quitting smoking, if you smoke.  Getting regular exercise. Ask your health care provider to suggest some activities that are safe for you.  Eating a heart-healthy diet. A registered dietitian can help you learn healthy eating options.  Maintaining a healthy weight.   Managing diabetes, if necessary.  Reducing stress.  Limiting how much alcohol you drink. SEEK IMMEDIATE MEDICAL CARE IF:   You have sudden, unexplained chest discomfort.  You have sudden, unexplained discomfort in your arms, back, neck, or jaw.  You have shortness of breath at any time.  You suddenly start to sweat or your skin gets clammy.  You feel nauseous or vomit.  You suddenly feel light-headed or dizzy.  Your heart begins to beat fast or feels like it is skipping beats. These symptoms may represent a serious problem that is an emergency. Do not wait to see if the symptoms will go away. Get medical help right away. Call your local emergency services (911 in the [place] Do not drive yourself to the hospital.   This information is not intended to replace advice given to you by your health care provider. Make sure you discuss any questions you have with your health care provider.   Document Released: 09/05/2005 Document Revised: 09/26/2014 Document Reviewed: 11/08/2013 [employer] Interactive Patient Education 2016 [employer]  

## 2016-05-10 NOTE — Discharge Summary (Signed)
Discharge Summary    Patient ID: ABUKAR HARP,  MRN: [id number], DOB/AGE: 01/16/1932 82 y.o.  Admit date: 04/27/2016 Discharge date: 05/10/2016  Primary Care Provider: [facility] Gainer Primary Cardiologist: Dr. Tamala Julian  Discharge Diagnoses    Principal Problem:   ST elevation (STEMI) myocardial infarction involving other coronary artery of inferior wall [facility]) Active Problems:   Hypercholesterolemia   Essential hypertension   AV block, Mobitz 2   STEMI (ST elevation myocardial infarction) ([place])   Cardiogenic shock (HCC)   1St degree AV block   Pyrexia   Leukocytosis   Allergies Allergies  Allergen Reactions  . Penicillins Rash    Has patient had a PCN reaction causing immediate rash, facial/tongue/throat swelling, SOB or lightheadedness with hypotension: Yes Has patient had a PCN reaction causing severe rash involving mucus membranes or skin necrosis: No Has patient had a PCN reaction that required hospitalization No Has patient had a PCN reaction occurring within the last 10 years: No If all of the above answers are "NO", then may proceed with Cephalosporin use.     Diagnostic Studies/Procedures    LHC: 04/27/2016  Conclusion     Ost Cx to Mid Cx lesion, 90 %stenosed.  Ost 1st Mrg to 1st Mrg lesion, 50 %stenosed.  A STENT RESOLUTE INTEG [id number] drug eluting stent was successfully placed, and does not overlap previously placed stent.  Mid RCA to Dist RCA lesion, 100 %stenosed.  Post intervention, there is a 0% residual stenosis.  A STENT RESOLUTE INTEG [id number] drug eluting stent was successfully placed, and does not overlap previously placed stent.  Prox RCA lesion, 80 %stenosed.  Post intervention, there is a 0% residual stenosis.  The left ventricular ejection fraction is 45-50% by visual estimate.  The left ventricular systolic function is normal.    Acute inferior ST elevation myocardial infarction, complicated by Mobitz 2 second degree heart block  with heart rate of 29 bpm and hypotension.  Successful insertion of temporary pacemaker wire for symptomatic bradycardia.  Total occlusion of the mid RCA reduced to 0% after drug-eluting stent implantation using a Resolute Integrity 2.75 x 30 mm drug-eluting stent postdilated to 3.0.  Successful angioplasty of the proximal right coronary from 80% to 0% with TIMI grade 3 flow using a resolute Integrity 3.0 x 18 mm DES postdilated to 3.25 mm in diameter.  Segmental 60% distal left main and 50% ostial involvement.  90% ostial to proximal circumflex containing thrombus.  Otherwise widely patent circumflex and LAD vessels.  Inferior wall akinesis. EF 50% with low LVEDP.   RECOMMENDATION:  Continue temporary transvenous pacemaker until recovery of AV conduction.  Aggrastat 18 hours.  Aspirin and Brilinta.  Hopeful conservative management for 4-6 weeks then consider re-study to look at the circumflex coronary artery (may be required prior to discharge if recurrent anginal/ischemic symptoms) and make a decision at that time about the possibility of coronary bypass grafting for left main disease.     _____________   History of Present Illness     Mr. Sotto Is an 82 year old male with past history of seizure disorder, prostate cancer, hypertension, hyperlipidemia, GERD and no prior cardiac history who presented to [facility] on 04/27/2016 via EMS as a code STEMI. He reports waking up to have a bowel movement that morning and shortly after starting to feel weak and diaphoretic. He denied any chest pain or shortness of breath, but EMS was called, and initial EKG showed heart rates in the 30s with inferior  ST elevation as well as reciprocal changes. His heart rate sustained in the 20s to 30s noted in second-degree heart block. Presented to the [facility] cath lab chest pain-free but complained of feeling weak and "woozy". He was taken directly to the cath lab for emergency temporary  pacemaker followed by coronary angiography to define anatomy and guide therapy. Bradycardia at that time was suspected to be due to acute ischemia. The EKG though not diagnostic of ST elevation MI, was considered concerning.  Hospital Course     Consultants: None  In the cath lab he was noted to have an acute MI, complicated by severe bradycardia/Mobitz 2 second-degree heart block with heart rate noted in the upper 20s. Blood pressure was also noted to be 10mmHg. He was noted to have cardiogenic shock, secondary to the above stated. He underwent PTCA and stenting of the mid right to proximal right RCA with reduction in lesion from 100% to 0%, with 50% ostial to 60% segmental mid to distal left main, and 90% thrombus filled ostial to proximal circumflex, with distal right coronary filled by collaterals from left-to-right prior to PTCA. His EF was estimated to be 55% with inferior basal akinesis. Plan at that time was to continue transvenous pacemaker until recovery of AV node conduction. He was placed on aspirin and Plavix, and received Aggrastat for 18 hours. He was transferred to the unit under close observation.  On the following day his temporary pacemaker was turned off and telemetry showed continued Mobitz type II AV block with a ventricular rate of 50-60 bpm. He no longer complained of chest pain, temporary pacemaker wire was kept in place and he remained in the CCU for further observation.  On 8/11 he remained chest pain-free but did note mild shortness of breath, and heart rate down to the 30s along with 2:1 block again. Images from heart cath were reviewed again with Dr. Tamala Julian who wanted to continue conservative management for 4-6 weeks then consider relook cath at circumflex artery, and at that time make a decision about the possibility of CABG for left main disease. Decision was made to consult EP regarding pacemaker. At that time EP reduced pacer rate to 30, and followed his progress final  determination of pacemaker placement deferred.   On 8/13 the decision was made to remove his temporary pacer wire given that his bradycardia have resolved and he was not requiring backup pacing. Did continue to have some episodes of second-degree heart block. Of note he was also undergoing good diuresis with total urine output at that time of 2.1 L. Also noted to be febrile with leukocytosis, so UA, x-ray and all cultures were checked given he had a line in his groin. Chest x-ray showed no acute process, and mild pulmonary interstitial edema. Follow-up CBC showed an improvement in his leukocytosis, and UA was negative.  On 8/14 he did have a episode of syncope reported that night while using bedside commode, and a long pause was noted on telemetry, and patient was reported to be unresponsive. Upon returning to bed patient became more alert and responsive, heart rate was noted to be back in the 50s to 70s, with stable BP. EKG at that time showed no acute changes. EP was asked to see the patient again in regards to this event. It was reported that he developed 2-1 block followed by a pause of 26 seconds, proceeded by P-P prolongation that was associated with moving her to the bedside commode. This episode was thought  to be vagally mediated, and no further recommendations were given.  On 8/15 telemetry was reviewed and showed no significant pauses with plan to get patient out of bed that day and ambulate. Did spike a fever of 101 the previous night, and repeat UA and urine cultures were ordered. Both were negative. Of note previous blood cultures resulted negative. In talking with nursing staff there was concern for aspiration noted, therefore he underwent swallow study. Speech therapy recommended a regular diet, with home meds with pured. Mild pharyngeal dysphagia was noted. Of note at this time PT was following patient, and his progression with recommendations for SNF placement.   On 8/17 telemetry reviewed  which resembled no further bradycardia or pauses. The decision was made to transfer to a telemetry bed. Did experience transient episode of hypotension which resolved quickly with IV hydration. He was noted to be progressing with PT/rehabilitation and ambulated down the hall.  On 8/19 telemetry reviewed which showed second-degree AV block persisted, but no significant bradycardia. At that time patient continued to work with PT for rehabilitation, and was awaiting SNF placement. Of note patient received a [facility], but family preferred [facility]. At that time continued to wait for further recommendations from social work.  On 8/21 telemetry was again reviewed showing continued second-degree AV block with occasional junctional escape beats, but patient remained asymptomatic. He was followed on telemetry overnight, with plans to discharge the following day. Suggestion of outpatient Holter monitor in 4-6 weeks if no more significant bradycardia was noted during this admission.  On 8/22 the patient was seen and assessed by Dr. Stanford Breed and noted stable for [facility]. He will need follow-up in the outpatient setting with Dr. Tamala Julian for consideration of relook cath related to left main disease. He'll be continued on aspirin, Plavix, and statin. No beta blocker due to his bradycardia. Of note patient intermittently experienced hematuria during this admission. Dr. Stanford Breed discussed the case with Dr. Tresa Moore of urology noting that patient had a recent resection of large bladder tumor with instillation of epirubicin. They felt patient patient was stable to be discharged in their office contact patient with follow-up in the outpatient setting. If patient were to develop difficulty urinating or clots follow-up will be need to be arranged sooner. Hemoglobin is stable on the day of discharge, but would recheck in one week. Plan is to continue  Plavix given his recent DES and complicated inferior MI.   I have arranged for TOC appt in the office, but will need follow up with Dr. Tamala Julian regarding plans for further interventions.  _____________  Discharge Vitals Blood pressure (!) 121/58, pulse 70, temperature 98.4 F (36.9 C), temperature source Oral, resp. rate 16, height 6' (1.829 m), weight 176 lb 11.2 oz (80.2 kg), SpO2 96 %.  Filed Weights   05/07/16 0500 05/09/16 0500 05/10/16 0500  Weight: 180 lb 6.4 oz (81.8 kg) 177 lb 9.6 oz (80.6 kg) 176 lb 11.2 oz (80.2 kg)    Labs & Radiologic Studies    CBC  Recent Labs  05/09/16 0528 05/10/16 0407  WBC 14.3* 13.7*  HGB 10.4* 10.6*  HCT 32.8* 33.3*  MCV 90.4 90.0  PLT 575* [id number]*   Basic Metabolic Panel  Recent Labs  05/09/16 0528 05/10/16 0407  NA 137 137  K 3.8 3.8  CL 105 105  CO2 24 25  GLUCOSE 112* 110*  BUN 14 15  CREATININE 0.77 0.76  CALCIUM 8.9 9.1   Liver Function Tests No results for input(s): AST, ALT, ALKPHOS, BILITOT, PROT, ALBUMIN in the last 72 hours. No results for input(s): LIPASE, AMYLASE in the last 72 hours. Cardiac Enzymes No results for input(s): CKTOTAL, CKMB, CKMBINDEX, TROPONINI in the last 72 hours. BNP Invalid input(s): POCBNP D-Dimer No results for input(s): DDIMER in the last 72 hours. Hemoglobin A1C No results for input(s): HGBA1C in the last 72 hours. Fasting Lipid Panel No results for input(s): CHOL, HDL, LDLCALC, TRIG, CHOLHDL, LDLDIRECT in the last 72 hours. Thyroid Function Tests No results for input(s): TSH, T4TOTAL, T3FREE, THYROIDAB in the last 72 hours.  Invalid input(s): FREET3 _____________  Dg Chest 2 View  Result Date: 05/02/2016 CLINICAL DATA:  Cardiac arrhythmia.  Fever Syncope. EXAM: CHEST  2 VIEW COMPARISON:  05/01/2016 FINDINGS: Cardiomegaly. Calcified tortuous aorta. No active infiltrates or failure. Mild hyperinflation suggesting COPD. Improved interstitial prominence compared with yesterday's  radiograph. Osteopenia. IMPRESSION: Cardiomegaly. Lung fields now essentially clear. No active infiltrates or failure. Electronically Signed   By: Staci Righter M.D.   On: 05/02/2016 19:24   Dg Chest Port 1 View  Result Date: 05/01/2016 CLINICAL DATA:  82 year old male with fever. Initial encounter. EXAM: PORTABLE CHEST 1 VIEW COMPARISON:  04/02/2015 and earlier. FINDINGS: Portable AP semi upright view at 1455 hours. Stable cardiomegaly and mediastinal contours. Chronic but increased appearing widespread pulmonary interstitial opacity. No pneumothorax, pleural effusion or confluent pulmonary opacity. Calcified aortic atherosclerosis. Visualized tracheal air column is within normal limits. IMPRESSION: Acute on chronic increased interstitial opacity in both lungs. Favor viral/atypical respiratory infection in this setting. Main differential consideration is pulmonary interstitial edema. Electronically Signed   By: Genevie Ann M.D.   On: 05/01/2016 15:15   Dg Swallowing Func-speech Pathology  Result Date: 05/04/2016 Objective Swallowing Evaluation: Type of Study: MBS-Modified Barium Swallow Study Patient Details Name: NASSIR QUINTIN MRN: [id number] Date of Birth: 1931/09/24 Today's Date: 05/04/2016 Time: SLP Start Time (ACUTE ONLY): 1040-SLP Stop Time (ACUTE ONLY): 1112 SLP Time Calculation (min) (ACUTE ONLY): 32 min Past Medical History: Past Medical History: Diagnosis Date . Allergy   pcn . Arteriovenous malformation   ([facility]) . Blood transfusion   right eye  no surgery as yet . ED (erectile dysfunction)  . GERD (gastroesophageal reflux disease)  . Hypercholesterolemia  . Hypertension  . Prostate cancer ([place]) 11/02/11 bxs  Adenocarcinoma,Gleason=3+3=6 & 3+4=7,PSA+1.58 Volume=19cc . Seizures ([place])  Past Surgical History: Past Surgical History: Procedure Laterality Date . APPENDECTOMY   . CARDIAC CATHETERIZATION N/A 04/27/2016  Procedure: Left Heart Cath and Coronary Angiography;  Surgeon: Belva Crome, MD;  Location: [place] [facility];  Service: Cardiovascular;  Laterality: N/A; . CARDIAC CATHETERIZATION N/A 04/27/2016  Procedure: Temporary Pacemaker;  Surgeon: Belva Crome, MD;  Location: [place] [facility];  Service: Cardiovascular;  Laterality: N/A; . CARDIAC CATHETERIZATION N/A 04/27/2016  Procedure: Coronary Stent Intervention;  Surgeon: Belva Crome, MD;  Location: [place] [facility];  Service: Cardiovascular;  Laterality: N/A;  Proximal RCA Mid RCA . CRANIOTOMY  1980 . CYSTOSCOPY  01/19/2012  Procedure: CYSTOSCOPY;  Surgeon: Malka So, MD;  Location: [facility];  Service: Urology;;  no seeds found in bladder . CYSTOSCOPY W/ RETROGRADES Bilateral 04/19/2016  Procedure: CYSTOSCOPY WITH BILATERAL RETROGRADE PYELOGRAM TRANSURETHRAL RESECTION OF BLADDER TUMOR ;  Surgeon: [name], MD;  Location: [facility];  Service: Urology;  Laterality: Bilateral; . RADIOACTIVE SEED IMPLANT  01/19/2012  Procedure: RADIOACTIVE SEED IMPLANT;  Surgeon:  Malka So, MD;  Location: [facility];  Service: Urology;  Laterality: N/A;  66 seeds implanted HPI: 82 y/o gentleman with PMG significant for seizure disorder, AVM, HTN, GERD. Presented as code STEMI. Swallow assessment ordered secondary to concern for possible aspiration with temp spikes and increased WBC.  Subjective: Pleasant, hard working. Assessment / Plan / Recommendation CHL IP CLINICAL IMPRESSIONS 05/04/2016 Therapy Diagnosis Mild pharyngeal phase dysphagia Clinical Impression Pt presents with mild pharyngeal phase dysphagia characterized by mild pharyngeal delay with flash penetration of thins and difficulty coordinating meds with liquids. Pt also has generalized pharyngeal weakness resulting in mild residue with all consistencies. Pt was able to manage residue [facility] with self-initiated double swallow with all boluses. Recommend: Regular diet with thins, meds whole with puree, upright positioning with all PO, slow rate of intake and small bites/sips. Pt to take  rest breaks as needed when fatigued. Pt/family and RN all verbalized agreement with recommendations. Will sign off at this time.  Impact on safety and function Mild aspiration risk   CHL IP TREATMENT RECOMMENDATION 05/04/2016 Treatment Recommendations No treatment recommended at this time   Prognosis 05/04/2016 Prognosis for Safe Diet Advancement Good Barriers to Reach Goals -- Barriers/Prognosis Comment -- CHL IP DIET RECOMMENDATION 05/04/2016 SLP Diet Recommendations Regular solids;Thin liquid Liquid Administration via Cup;Straw Medication Administration Whole meds with puree Compensations Slow rate;Small sips/bites Postural Changes Seated upright at 90 degrees;Remain semi-upright after after feeds/meals (Comment)   CHL IP OTHER RECOMMENDATIONS 05/04/2016 Recommended Consults -- Oral Care Recommendations Oral care QID Other Recommendations --   CHL IP FOLLOW UP RECOMMENDATIONS 05/04/2016 Follow up Recommendations None   No flowsheet data found.     CHL IP ORAL PHASE 05/04/2016 Oral Phase WFL Oral - Pudding Teaspoon -- Oral - Pudding Cup -- Oral - Honey Teaspoon -- Oral - Honey Cup -- Oral - Nectar Teaspoon -- Oral - Nectar Cup -- Oral - Nectar Straw -- Oral - Thin Teaspoon -- Oral - Thin Cup -- Oral - Thin Straw -- Oral - Puree -- Oral - Mech Soft -- Oral - Regular -- Oral - Multi-Consistency -- Oral - Pill -- Oral Phase - Comment --  CHL IP PHARYNGEAL PHASE 05/04/2016 Pharyngeal Phase Impaired Pharyngeal- Pudding Teaspoon -- Pharyngeal -- Pharyngeal- Pudding Cup -- Pharyngeal -- Pharyngeal- Honey Teaspoon -- Pharyngeal -- Pharyngeal- Honey Cup -- Pharyngeal -- Pharyngeal- Nectar Teaspoon -- Pharyngeal -- Pharyngeal- Nectar Cup -- Pharyngeal -- Pharyngeal- Nectar Straw -- Pharyngeal -- Pharyngeal- Thin Teaspoon -- Pharyngeal -- Pharyngeal- Thin Cup Delayed swallow initiation-vallecula;Penetration/Aspiration before swallow;Pharyngeal residue - pyriform Pharyngeal Material enters airway, remains ABOVE vocal cords then  ejected out Pharyngeal- Thin Straw Delayed swallow initiation-vallecula;Penetration/Aspiration before swallow;Pharyngeal residue - pyriform Pharyngeal Material enters airway, remains ABOVE vocal cords then ejected out Pharyngeal- Puree -- Pharyngeal -- Pharyngeal- Mechanical Soft -- Pharyngeal -- Pharyngeal- Regular -- Pharyngeal -- Pharyngeal- Multi-consistency -- Pharyngeal -- Pharyngeal- Pill Pharyngeal residue - pyriform Pharyngeal -- Pharyngeal Comment --  No flowsheet data found. No flowsheet data found. Vinetta Bergamo MA, CCC-SLP 05/04/2016, 11:38 AM              Disposition   Pt is being discharged to SNF today in good condition.  Follow-up Plans & Appointments     Contact information for follow-up providers    Cecilie Kicks, NP Follow up on 05/16/2016.   Specialties:  Cardiology, Radiology Why:  2:30pm for your hospital follow up.  Contact information: [place]        Alexis Frock, MD .   Specialty:  Urology Why:  Office will call with follow up appt. Contact information: [address] [place] [address] [phone]            Contact information for after-discharge care    Destination    [facility] .   Specialty:  [place] information: [address] [place] [phone]                 Discharge Instructions    ([place]) Call MD:  Anytime you have any of the following symptoms: 1) 3 pound weight gain in 24 hours or 5 pounds in 1 week 2) shortness of breath, with or without a dry hacking cough 3) swelling in the hands, feet or stomach 4) if you have to sleep on extra pillows at night in order to breathe.    Complete by:  As directed   Amb Referral to Cardiac Rehabilitation    Complete by:  As directed   Diagnosis:   STEMI Coronary Stents     Call MD for:  difficulty breathing, headache or visual disturbances     Complete by:  As directed   Call MD for:  redness, tenderness, or signs of infection (pain, swelling, redness, odor or green/yellow discharge around incision site)    Complete by:  As directed   Diet - low sodium heart healthy    Complete by:  As directed   Diet - low sodium heart healthy    Complete by:  As directed   Increase activity slowly    Complete by:  As directed   Increase activity slowly    Complete by:  As directed      Discharge Medications   Current Discharge Medication List    START taking these medications   Details  aspirin EC 81 MG tablet Take 1 tablet (81 mg total) by mouth daily.    atorvastatin (LIPITOR) 80 MG tablet Take 1 tablet (80 mg total) by mouth daily at 6 PM. Qty: 30 tablet, Refills: 12    clopidogrel (PLAVIX) 75 MG tablet Take 1 tablet (75 mg total) by mouth daily. Qty: 30 tablet, Refills: 12    magnesium hydroxide (MILK OF MAGNESIA) 400 MG/5ML suspension Take 30 mLs by mouth daily as needed for mild constipation. Qty: 360 mL, Refills: 0    nitroGLYCERIN (NITROSTAT) 0.4 MG SL tablet Place 1 tablet (0.4 mg total) under the tongue every 5 (five) minutes as needed for chest pain. Qty: 25 tablet, Refills: 3      CONTINUE these medications which have CHANGED   Details  !! PHENobarbital (LUMINAL) 64.8 MG tablet Take 1 tablet (64.8 mg total) by mouth every morning. Qty: 30 tablet, Refills: 4    !! PHENobarbital (LUMINAL) 97.2 MG tablet Take 1 tablet (97.2 mg total) by mouth every evening. Qty: 30 tablet, Refills: 4     !! - Potential duplicate medications found. Please discuss with provider.    CONTINUE these medications which have NOT CHANGED   Details  calcium carbonate (OS-CAL) 600 MG TABS tablet Take 600 mg by mouth daily with breakfast.    Multiple Vitamins-Minerals (ICAPS AREDS FORMULA PO) Take 1-2 capsules by mouth 2 (two) times daily. Take 1 capsule in the morning, and 2 capsules in evening    pantoprazole (PROTONIX) 40 MG tablet TAKE 1  TABLET ONCE DAILY 30 MINUTES  BEFORE a MEAL. Qty: 90 tablet, Refills: 3    phenytoin (DILANTIN) 100 MG ER capsule 2 caps in the morning and 3 caps QHS Qty: 450 capsule, Refills: 3    VITAMIN D, ERGOCALCIFEROL, PO Take 600 Units by mouth daily.     HYDROcodone-acetaminophen (NORCO) 5-325 MG tablet Take 1 tablet by mouth every 6 (six) hours as needed for moderate pain. Qty: 10 tablet, Refills: 0      STOP taking these medications     pravastatin (PRAVACHOL) 80 MG tablet          Aspirin prescribed at discharge?  Yes High Intensity Statin Prescribed? (Lipitor 40-80mg  or Crestor 20-40mg ): Yes Beta Blocker Prescribed? No: 2 degree AV block For EF <40%, was ACEI/ARB Prescribed? No: consider in the outpatient setting ADP Receptor Inhibitor Prescribed? (i.e. Plavix etc.-Includes Medically Managed Patients): Yes For EF <40%, Aldosterone Inhibitor Prescribed? No:  Was EF assessed during THIS hospitalization? Yes Was Cardiac Rehab II ordered? (Included Medically managed Patients): Yes   Outstanding Labs/Studies   CBC in one week.   Duration of Discharge Encounter   Greater than 30 minutes including physician time.  Signed, Reino Bellis NP-C 05/10/2016, 12:11 PM

## 2016-05-13 ENCOUNTER — Encounter: Payer: Self-pay | Admitting: Cardiology

## 2016-05-16 ENCOUNTER — Ambulatory Visit (INDEPENDENT_AMBULATORY_CARE_PROVIDER_SITE_OTHER): Payer: Medicare Other | Admitting: Cardiology

## 2016-05-16 ENCOUNTER — Encounter: Payer: Self-pay | Admitting: Cardiology

## 2016-05-16 VITALS — BP 140/66 | HR 86 | Ht 72.0 in | Wt 176.8 lb

## 2016-05-16 DIAGNOSIS — I441 Atrioventricular block, second degree: Secondary | ICD-10-CM

## 2016-05-16 DIAGNOSIS — R319 Hematuria, unspecified: Secondary | ICD-10-CM

## 2016-05-16 DIAGNOSIS — I440 Atrioventricular block, first degree: Secondary | ICD-10-CM | POA: Diagnosis not present

## 2016-05-16 DIAGNOSIS — I2111 ST elevation (STEMI) myocardial infarction involving right coronary artery: Secondary | ICD-10-CM

## 2016-05-16 DIAGNOSIS — I10 Essential (primary) hypertension: Secondary | ICD-10-CM

## 2016-05-16 DIAGNOSIS — I2510 Atherosclerotic heart disease of native coronary artery without angina pectoris: Secondary | ICD-10-CM

## 2016-05-16 LAB — CBC WITH DIFFERENTIAL/PLATELET
BASOS PCT: 1 %
Basophils Absolute: 132 cells/uL (ref 0–200)
EOS PCT: 4 %
Eosinophils Absolute: 528 cells/uL — ABNORMAL HIGH (ref 15–500)
HCT: 32.4 % — ABNORMAL LOW (ref 38.5–50.0)
Hemoglobin: 10.8 g/dL — ABNORMAL LOW (ref 13.2–17.1)
LYMPHS PCT: 21 %
Lymphs Abs: 2772 cells/uL (ref 850–3900)
MCH: 29.5 pg (ref 27.0–33.0)
MCHC: 33.3 g/dL (ref 32.0–36.0)
MCV: 88.5 fL (ref 80.0–100.0)
MONOS PCT: 8 %
MPV: 9.1 fL (ref 7.5–12.5)
Monocytes Absolute: 1056 cells/uL — ABNORMAL HIGH (ref 200–950)
NEUTROS ABS: 8712 {cells}/uL — AB (ref 1500–7800)
Neutrophils Relative %: 66 %
PLATELETS: 714 10*3/uL — AB (ref 140–400)
RBC: 3.66 MIL/uL — AB (ref 4.20–5.80)
RDW: 14.9 % (ref 11.0–15.0)
WBC: 13.2 10*3/uL — AB (ref 3.8–10.8)

## 2016-05-16 LAB — BASIC METABOLIC PANEL
BUN: 20 mg/dL (ref 7–25)
CHLORIDE: 106 mmol/L (ref 98–110)
CO2: 26 mmol/L (ref 20–31)
CREATININE: 0.94 mg/dL (ref 0.70–1.11)
Calcium: 9.3 mg/dL (ref 8.6–10.3)
GLUCOSE: 104 mg/dL — AB (ref 65–99)
POTASSIUM: 4 mmol/L (ref 3.5–5.3)
SODIUM: 143 mmol/L (ref 135–146)

## 2016-05-16 MED ORDER — ASPIRIN EC 81 MG PO TBEC: 81.0000 mg | DELAYED_RELEASE_TABLET | Freq: Every day | ORAL | 3 refills | Status: DC

## 2016-05-16 NOTE — Progress Notes (Signed)
Cardiology Office Note   Date:  05/16/2016   ID:  Joshua [name], DOB 10-20-1931, MRN [id number]  PCP:  Redge Gainer, MD  Cardiologist:  Dr. Tamala Julian    Chief Complaint  Patient presents with  . Hospitalization Follow-up      History of Present Illness: Joshua [name] is a 82 y.o. male who presents for post hospitalization for STEMI, compicated by Mobitz 2 with HR 29 and hyotensive.  Had T pacer.  And DES to [facility].  Continues with residual CAD Segmental 60% distal left main and 50% ostial involvement.  90% ostial to proximal circumflex containing thrombus.  Otherwise widely patent circumflex and LAD vessels.  EF 50% with low LVEDP.  04/27/16 to 05/10/16.   Also with cardiogenic shock. He was seen by Dr. Caryl Comes for possible PPM.  But with RCA as culprit vessel it is thought this will clear.   He was discharged to SNF for rehab.    Plan at discharge was possible re-cath in future once stronger.  Dr. Tamala Julian to decide.  Possible monitoring to eval for continued bradycardia.     Today his energy is improving.  He denies chest pain or SOB.  He would prefer to have PPM.  He is anxious that he may have further episodes of brady or blocks.  Additionally he had gross hematuria again. His ASA was stopped.  Since ASA was stopped bleeding has improved.  His plavix continues.  He is to see Dr. Tammi Klippel tomorrow.  Past Medical History:  Diagnosis Date  . Allergy    pcn  . Arteriovenous malformation    (CNS)  . Blood transfusion    right eye  no surgery as yet  . CAD (coronary artery disease)    a. 04/27/2016 PCI with DES to RCA with 50% ostial to 60% segmental mid to distal left main, and 90% thrombus filled ostial to proximal circumflex, with distal right coronary filled by collaterals from left-to-right  . ED (erectile dysfunction)   . GERD (gastroesophageal reflux disease)   . Hypercholesterolemia   . Hypertension   . Prostate cancer ([place]) 11/02/11 bxs   Adenocarcinoma,Gleason=3+3=6 & 3+4=7,PSA+1.58  Volume=19cc  . Seizures ([place])     Past Surgical History:  Procedure Laterality Date  . APPENDECTOMY    . CARDIAC CATHETERIZATION N/A 04/27/2016   Procedure: Left Heart Cath and Coronary Angiography;  Surgeon: Belva Crome, MD;  Location: [place] [facility];  Service: Cardiovascular;  Laterality: N/A;  . CARDIAC CATHETERIZATION N/A 04/27/2016   Procedure: Temporary Pacemaker;  Surgeon: Belva Crome, MD;  Location: [place] [facility];  Service: Cardiovascular;  Laterality: N/A;  . CARDIAC CATHETERIZATION N/A 04/27/2016   Procedure: Coronary Stent Intervention;  Surgeon: Belva Crome, MD;  Location: [place] [facility];  Service: Cardiovascular;  Laterality: N/A;  Proximal RCA Mid RCA  . CRANIOTOMY  1980  . CYSTOSCOPY  01/19/2012   Procedure: CYSTOSCOPY;  Surgeon: Malka So, MD;  Location: [facility];  Service: Urology;;  no seeds found in bladder  . CYSTOSCOPY W/ RETROGRADES Bilateral 04/19/2016   Procedure: CYSTOSCOPY WITH BILATERAL RETROGRADE PYELOGRAM TRANSURETHRAL RESECTION OF BLADDER TUMOR ;  Surgeon: [name], MD;  Location: [facility];  Service: Urology;  Laterality: Bilateral;  . RADIOACTIVE SEED IMPLANT  01/19/2012   Procedure: RADIOACTIVE SEED IMPLANT;  Surgeon: Malka So, MD;  Location: [facility];  Service: Urology;  Laterality: N/A;  68 seeds implanted     Current  Outpatient Prescriptions  Medication Sig Dispense Refill  . atorvastatin (LIPITOR) 80 MG tablet Take 1 tablet (80 mg total) by mouth daily at 6 PM. 30 tablet 12  . clopidogrel (PLAVIX) 75 MG tablet Take 1 tablet (75 mg total) by mouth daily. 30 tablet 12  . HYDROcodone-acetaminophen (NORCO) 5-325 MG tablet Take 1 tablet by mouth every 6 (six) hours as needed for moderate pain. 10 tablet 0  . magnesium hydroxide (MILK OF MAGNESIA) 400 MG/5ML suspension Take 30 mLs by mouth daily as needed for mild constipation. 360 mL 0  . Multiple Vitamins-Minerals (ICAPS AREDS FORMULA PO) Take 1-2 capsules  by mouth 2 (two) times daily. Take 1 capsule in the morning, and 2 capsules in evening    . PHENobarbital (LUMINAL) 64.8 MG tablet Take 1 tablet (64.8 mg total) by mouth every morning. 30 tablet 4  . PHENobarbital (LUMINAL) 97.2 MG tablet Take 1 tablet (97.2 mg total) by mouth every evening. 30 tablet 4  . phenytoin (DILANTIN) 100 MG ER capsule Take two (2) capsules by mouth each morning. Take (3) capsules by moth each evening.    . ranitidine (ZANTAC) 300 MG tablet Take 300 mg by mouth daily.    Marland Kitchen VITAMIN D, ERGOCALCIFEROL, PO Take 50,000 Units by mouth once a week. (Wednesdays)    . aspirin EC 81 MG tablet Take 1 tablet (81 mg total) by mouth daily. 90 tablet 3   No current facility-administered medications for this visit.     Allergies:   Penicillins    Social History:  The patient  reports that he quit smoking about 43 years ago. He has a 30.00 pack-year smoking history. He has never used smokeless tobacco. He reports that he drinks alcohol. He reports that he does not use drugs.   Family History:  The patient's family history includes Emphysema in his father and mother; Stroke in his mother.    ROS:  General:no colds or fevers, + weight decrease Skin:no rashes or ulcers HEENT:no blurred vision, no congestion CV:see HPI PUL:see HPI GI:no diarrhea constipation or melena, no indigestion GU:+ hematuria, no dysuria, ++urgency MS:no joint pain, no claudication Neuro:no syncope, no lightheadedness, hx of seizures. Endo:no diabetes, no thyroid disease  Wt Readings from Last 3 Encounters:  05/16/16 176 lb 12.8 oz (80.2 kg)  05/10/16 176 lb 11.2 oz (80.2 kg)  04/19/16 188 lb (85.3 kg)     PHYSICAL EXAM: VS:  BP 140/66   Pulse 86   Ht 6' (1.829 m)   Wt 176 lb 12.8 oz (80.2 kg)   BMI 23.98 kg/m  , BMI Body mass index is 23.98 kg/m. General:Pleasant affect, NAD Skin:Warm and dry, brisk capillary refill HEENT:normocephalic, sclera clear, mucus membranes moist Neck:supple, no  JVD, no bruits  Heart:S1S2 RRR without murmur, gallup, rub or click Lungs:clear without rales, rhonchi, or wheezes [id number], non tender, + BS, do not palpate liver spleen or masses Ext:no lower ext edema, 2+ pedal pulses, 2+ radial pulses Neuro:alert and oriented, MAE, follows commands, + facial symmetry    EKG:  EKG is ordered today. The ekg ordered today demonstrates SR with 1st degree AV block PR 334ms also PVC   Recent Labs: 04/30/2016: ALT 24 05/10/2016: BUN 15; Creatinine, Ser 0.76; Potassium 3.8; Sodium 137 05/16/2016: Hemoglobin 10.8; Platelets 714    Lipid Panel    Component Value Date/Time   CHOL 144 04/27/2016 1133   CHOL 162 09/10/2014 1201   TRIG 80 04/27/2016 1133   TRIG 85 01/20/2016 0958  HDL 43 04/27/2016 1133   HDL 69 01/20/2016 0958   CHOLHDL 3.3 04/27/2016 1133   VLDL 16 04/27/2016 1133   LDLCALC 85 04/27/2016 1133   LDLCALC 78 09/10/2014 1201   LDLCALC 99 05/21/2014 1101       Other studies Reviewed: Additional studies/ records that were reviewed today include:.  Cardiac cath: Conclusion     Ost Cx to Mid Cx lesion, 90 %stenosed.  Ost 1st Mrg to 1st Mrg lesion, 50 %stenosed.  A STENT RESOLUTE INTEG [id number] drug eluting stent was successfully placed, and does not overlap previously placed stent.  Mid RCA to Dist RCA lesion, 100 %stenosed.  Post intervention, there is a 0% residual stenosis.  A STENT RESOLUTE INTEG [id number] drug eluting stent was successfully placed, and does not overlap previously placed stent.  Prox RCA lesion, 80 %stenosed.  Post intervention, there is a 0% residual stenosis.  The left ventricular ejection fraction is 45-50% by visual estimate.  The left ventricular systolic function is normal.    Acute inferior ST elevation myocardial infarction, complicated by Mobitz 2 second degree heart block with heart rate of 29 bpm and hypotension.  Successful insertion of temporary pacemaker wire for symptomatic  bradycardia.  Total occlusion of the mid RCA reduced to 0% after drug-eluting stent implantation using a Resolute Integrity 2.75 x 30 mm drug-eluting stent postdilated to 3.0.  Successful angioplasty of the proximal right coronary from 80% to 0% with TIMI grade 3 flow using a resolute Integrity 3.0 x 18 mm DES postdilated to 3.25 mm in diameter.  Segmental 60% distal left main and 50% ostial involvement.  90% ostial to proximal circumflex containing thrombus.  Otherwise widely patent circumflex and LAD vessels.  Inferior wall akinesis. EF 50% with low LVEDP.   RECOMMENDATION:  Continue temporary transvenous pacemaker until recovery of AV conduction.  Aggrastat 18 hours.  Aspirin and Brilinta.  Hopeful conservative management for 4-6 weeks then consider re-study to look at the circumflex coronary artery (may be required prior to discharge if recurrent anginal/ischemic symptoms) and make a decision at that time about the possibility of coronary bypass grafting for left main disease.      ASSESSMENT AND PLAN:  1. Inf STEMI with stent to prox RCA.  On plavix and ASA but ASA was stopped recently for hematuria.  Discussed with Dr. [name] DOD and will resume.  Continue Plavix and ASA.  2. CAD residual disease 90% LCX with thrombus. And LM disease.  Will possibly need re-look after 4-6 weeks of conservative management.  Will see pt back in 2 weeks with myself and/or Dr. Tamala Julian.  Will send note to Dr. Tamala Julian today.  3.  Mobitz 2 with symptomatic bradycardia.  Today he has SR with 1st degree block. Though longer PR interval today than at discharge.  Pt would prefer a PPM, will have him wear a 48 hour monitor to see if further bradycardia.  4.  Hematuria, to see urology tomorrow.  Dr. Tammi Klippel could call Dr. Tamala Julian if questions.  5.  Anemia with hematuria will check CBC  6.  Hyperlipidemia check lipids and hepatic in 6 weeks.  7.  Hx of seizures on dilantin.    Current medicines are  reviewed with the patient today.  The patient Has no concerns regarding medicines.  The following changes have been made:  See above Labs/ tests ordered today include:see above  Disposition:   FU:  see above  Signed, Cecilie Kicks, NP  05/16/2016 9:59 PM  [place], [address] [place], [place] Phone: [phone]; Fax: [phone]

## 2016-05-16 NOTE — Patient Instructions (Signed)
Medication Instructions:  1) RESTART ASPIRIN 81 mg daily  Labwork: TODAY: CBC, BMET  Testing/Procedures: Your physician has recommended that you wear a holter monitor. Holter monitors are medical devices that record the heart's electrical activity. Doctors most often use these monitors to diagnose arrhythmias. Arrhythmias are problems with the speed or rhythm of the heartbeat. The monitor is a small, portable device. You can wear one while you do your normal daily activities. This is usually used to diagnose what is causing palpitations/syncope (passing out).  Follow-Up: Your physician recommends that you schedule a follow-up appointment in about 2 weeks with Dr. Tamala Julian OR with Cecilie Kicks, NP on a day Dr. Tamala Julian is in the office.  Any Other Special Instructions Will Be Listed Below (If Applicable).     If you need a refill on your cardiac medications before your next appointment, please call your pharmacy.

## 2016-05-17 DIAGNOSIS — R310 Gross hematuria: Secondary | ICD-10-CM | POA: Diagnosis not present

## 2016-05-17 NOTE — Progress Notes (Signed)
Please add him to my schedule 2 weeks from now.

## 2016-05-18 ENCOUNTER — Telehealth: Payer: Self-pay

## 2016-05-18 NOTE — Telephone Encounter (Signed)
Called to schedule a 2-3 wk work in appt with Newborn. lmctb.

## 2016-05-25 ENCOUNTER — Ambulatory Visit: Payer: Medicare Other | Admitting: Family Medicine

## 2016-05-26 ENCOUNTER — Ambulatory Visit (INDEPENDENT_AMBULATORY_CARE_PROVIDER_SITE_OTHER): Payer: Medicare Other

## 2016-05-26 DIAGNOSIS — I441 Atrioventricular block, second degree: Secondary | ICD-10-CM

## 2016-05-26 DIAGNOSIS — I440 Atrioventricular block, first degree: Secondary | ICD-10-CM | POA: Diagnosis not present

## 2016-05-28 DIAGNOSIS — J22 Unspecified acute lower respiratory infection: Secondary | ICD-10-CM | POA: Diagnosis not present

## 2016-05-29 DIAGNOSIS — R05 Cough: Secondary | ICD-10-CM | POA: Diagnosis not present

## 2016-05-29 DIAGNOSIS — R0602 Shortness of breath: Secondary | ICD-10-CM | POA: Diagnosis not present

## 2016-05-31 ENCOUNTER — Ambulatory Visit: Payer: Medicare Other | Admitting: Family Medicine

## 2016-06-01 ENCOUNTER — Observation Stay (HOSPITAL_COMMUNITY)
Admission: EM | Admit: 2016-06-01 | Discharge: 2016-06-04 | Disposition: A | Discharge status: Skilled Nursing Facility | Payer: Medicare Other | Attending: Internal Medicine | Admitting: Internal Medicine

## 2016-06-01 ENCOUNTER — Observation Stay (HOSPITAL_COMMUNITY): Payer: Medicare Other

## 2016-06-01 ENCOUNTER — Ambulatory Visit: Payer: Medicare Other | Admitting: Cardiology

## 2016-06-01 ENCOUNTER — Encounter (HOSPITAL_COMMUNITY): Payer: Self-pay

## 2016-06-01 ENCOUNTER — Emergency Department (HOSPITAL_COMMUNITY): Payer: Medicare Other

## 2016-06-01 DIAGNOSIS — R569 Unspecified convulsions: Secondary | ICD-10-CM | POA: Diagnosis not present

## 2016-06-01 DIAGNOSIS — Z7982 Long term (current) use of aspirin: Secondary | ICD-10-CM | POA: Diagnosis not present

## 2016-06-01 DIAGNOSIS — R4781 Slurred speech: Secondary | ICD-10-CM | POA: Diagnosis not present

## 2016-06-01 DIAGNOSIS — K922 Gastrointestinal hemorrhage, unspecified: Secondary | ICD-10-CM | POA: Diagnosis not present

## 2016-06-01 DIAGNOSIS — R0602 Shortness of breath: Secondary | ICD-10-CM | POA: Diagnosis not present

## 2016-06-01 DIAGNOSIS — R627 Adult failure to thrive: Secondary | ICD-10-CM | POA: Insufficient documentation

## 2016-06-01 DIAGNOSIS — Z860100 Personal history of colon polyps, unspecified: Secondary | ICD-10-CM

## 2016-06-01 DIAGNOSIS — Z7952 Long term (current) use of systemic steroids: Secondary | ICD-10-CM | POA: Insufficient documentation

## 2016-06-01 DIAGNOSIS — I252 Old myocardial infarction: Secondary | ICD-10-CM | POA: Diagnosis not present

## 2016-06-01 DIAGNOSIS — D649 Anemia, unspecified: Secondary | ICD-10-CM

## 2016-06-01 DIAGNOSIS — Z79899 Other long term (current) drug therapy: Secondary | ICD-10-CM | POA: Diagnosis not present

## 2016-06-01 DIAGNOSIS — F329 Major depressive disorder, single episode, unspecified: Secondary | ICD-10-CM | POA: Diagnosis not present

## 2016-06-01 DIAGNOSIS — E7800 Pure hypercholesterolemia, unspecified: Secondary | ICD-10-CM | POA: Insufficient documentation

## 2016-06-01 DIAGNOSIS — I2119 ST elevation (STEMI) myocardial infarction involving other coronary artery of inferior wall: Secondary | ICD-10-CM | POA: Diagnosis not present

## 2016-06-01 DIAGNOSIS — Z955 Presence of coronary angioplasty implant and graft: Secondary | ICD-10-CM | POA: Insufficient documentation

## 2016-06-01 DIAGNOSIS — E860 Dehydration: Secondary | ICD-10-CM

## 2016-06-01 DIAGNOSIS — J449 Chronic obstructive pulmonary disease, unspecified: Secondary | ICD-10-CM | POA: Insufficient documentation

## 2016-06-01 DIAGNOSIS — K219 Gastro-esophageal reflux disease without esophagitis: Secondary | ICD-10-CM | POA: Insufficient documentation

## 2016-06-01 DIAGNOSIS — R531 Weakness: Secondary | ICD-10-CM | POA: Diagnosis not present

## 2016-06-01 DIAGNOSIS — Z7902 Long term (current) use of antithrombotics/antiplatelets: Secondary | ICD-10-CM | POA: Insufficient documentation

## 2016-06-01 DIAGNOSIS — I10 Essential (primary) hypertension: Secondary | ICD-10-CM | POA: Diagnosis not present

## 2016-06-01 DIAGNOSIS — E43 Unspecified severe protein-calorie malnutrition: Secondary | ICD-10-CM | POA: Diagnosis not present

## 2016-06-01 DIAGNOSIS — R195 Other fecal abnormalities: Secondary | ICD-10-CM | POA: Insufficient documentation

## 2016-06-01 DIAGNOSIS — Z8551 Personal history of malignant neoplasm of bladder: Secondary | ICD-10-CM | POA: Insufficient documentation

## 2016-06-01 DIAGNOSIS — Z87891 Personal history of nicotine dependence: Secondary | ICD-10-CM | POA: Insufficient documentation

## 2016-06-01 DIAGNOSIS — I2510 Atherosclerotic heart disease of native coronary artery without angina pectoris: Secondary | ICD-10-CM | POA: Insufficient documentation

## 2016-06-01 DIAGNOSIS — G9389 Other specified disorders of brain: Secondary | ICD-10-CM | POA: Insufficient documentation

## 2016-06-01 DIAGNOSIS — Z8546 Personal history of malignant neoplasm of prostate: Secondary | ICD-10-CM | POA: Insufficient documentation

## 2016-06-01 DIAGNOSIS — R001 Bradycardia, unspecified: Secondary | ICD-10-CM | POA: Insufficient documentation

## 2016-06-01 DIAGNOSIS — R0902 Hypoxemia: Secondary | ICD-10-CM | POA: Diagnosis not present

## 2016-06-01 DIAGNOSIS — R05 Cough: Secondary | ICD-10-CM | POA: Diagnosis not present

## 2016-06-01 HISTORY — DX: Vitamin D deficiency, unspecified: E55.9

## 2016-06-01 LAB — CBC WITH DIFFERENTIAL/PLATELET
BASOS ABS: 0 10*3/uL (ref 0.0–0.1)
BASOS PCT: 0 %
Eosinophils Absolute: 0 10*3/uL (ref 0.0–0.7)
Eosinophils Relative: 0 %
HEMATOCRIT: 29 % — AB (ref 39.0–52.0)
HEMOGLOBIN: 9.4 g/dL — AB (ref 13.0–17.0)
Lymphocytes Relative: 14 %
Lymphs Abs: 1.7 10*3/uL (ref 0.7–4.0)
MCH: 30.4 pg (ref 26.0–34.0)
MCHC: 32.4 g/dL (ref 30.0–36.0)
MCV: 93.9 fL (ref 78.0–100.0)
Monocytes Absolute: 0.7 10*3/uL (ref 0.1–1.0)
Monocytes Relative: 6 %
NEUTROS ABS: 10.2 10*3/uL — AB (ref 1.7–7.7)
NEUTROS PCT: 80 %
Platelets: 331 10*3/uL (ref 150–400)
RBC: 3.09 MIL/uL — ABNORMAL LOW (ref 4.22–5.81)
RDW: 16.9 % — AB (ref 11.5–15.5)
WBC: 12.7 10*3/uL — AB (ref 4.0–10.5)

## 2016-06-01 LAB — POC OCCULT BLOOD, ED: FECAL OCCULT BLD: POSITIVE — AB

## 2016-06-01 LAB — URINALYSIS, ROUTINE W REFLEX MICROSCOPIC
Bilirubin Urine: NEGATIVE
Glucose, UA: NEGATIVE mg/dL
Hgb urine dipstick: NEGATIVE
KETONES UR: NEGATIVE mg/dL
NITRITE: NEGATIVE
PH: 7 (ref 5.0–8.0)
Protein, ur: NEGATIVE mg/dL
SPECIFIC GRAVITY, URINE: 1.026 (ref 1.005–1.030)

## 2016-06-01 LAB — COMPREHENSIVE METABOLIC PANEL
ALT: 31 U/L (ref 17–63)
AST: 30 U/L (ref 15–41)
Albumin: 3.1 g/dL — ABNORMAL LOW (ref 3.5–5.0)
Alkaline Phosphatase: 190 U/L — ABNORMAL HIGH (ref 38–126)
Anion gap: 6 (ref 5–15)
BUN: 21 mg/dL — ABNORMAL HIGH (ref 6–20)
CHLORIDE: 104 mmol/L (ref 101–111)
CO2: 27 mmol/L (ref 22–32)
Calcium: 8.6 mg/dL — ABNORMAL LOW (ref 8.9–10.3)
Creatinine, Ser: 0.76 mg/dL (ref 0.61–1.24)
Glucose, Bld: 129 mg/dL — ABNORMAL HIGH (ref 65–99)
POTASSIUM: 4.3 mmol/L (ref 3.5–5.1)
Sodium: 137 mmol/L (ref 135–145)
Total Bilirubin: 0.6 mg/dL (ref 0.3–1.2)
Total Protein: 6.9 g/dL (ref 6.5–8.1)

## 2016-06-01 LAB — MRSA PCR SCREENING: MRSA BY PCR: NEGATIVE

## 2016-06-01 LAB — PHENYTOIN LEVEL, TOTAL: Phenytoin Lvl: 25.1 ug/mL — ABNORMAL HIGH (ref 10.0–20.0)

## 2016-06-01 LAB — URINE MICROSCOPIC-ADD ON: RBC / HPF: NONE SEEN RBC/hpf (ref 0–5)

## 2016-06-01 LAB — TROPONIN I

## 2016-06-01 LAB — BRAIN NATRIURETIC PEPTIDE: B NATRIURETIC PEPTIDE 5: 107.1 pg/mL — AB (ref 0.0–100.0)

## 2016-06-01 LAB — PHENOBARBITAL LEVEL: Phenobarbital: 38 ug/mL — ABNORMAL HIGH (ref 15.0–30.0)

## 2016-06-01 MED ORDER — CLOPIDOGREL BISULFATE 75 MG PO TABS
75.0000 mg | ORAL_TABLET | Freq: Every day | ORAL | Status: DC
  Administered 2016-06-02 – 2016-06-04 (×3): 75 mg via ORAL
  Filled 2016-06-01 (×3): qty 1

## 2016-06-01 MED ORDER — GUAIFENESIN ER 600 MG PO TB12
1200.0000 mg | ORAL_TABLET | Freq: Two times a day (BID) | ORAL | Status: DC
  Administered 2016-06-01 – 2016-06-04 (×6): 1200 mg via ORAL
  Filled 2016-06-01 (×6): qty 2

## 2016-06-01 MED ORDER — PHENOBARBITAL 32.4 MG PO TABS
97.2000 mg | ORAL_TABLET | Freq: Every evening | ORAL | Status: DC
  Administered 2016-06-01 – 2016-06-02 (×2): 97.2 mg via ORAL
  Filled 2016-06-01 (×2): qty 3

## 2016-06-01 MED ORDER — PHENYTOIN SODIUM EXTENDED 100 MG PO CAPS: 200.0000 mg | ORAL_CAPSULE | Freq: Every morning | ORAL | Status: DC

## 2016-06-01 MED ORDER — SENNOSIDES-DOCUSATE SODIUM 8.6-50 MG PO TABS: 1.0000 | ORAL_TABLET | Freq: Every evening | ORAL | Status: DC | PRN

## 2016-06-01 MED ORDER — PREDNISONE 20 MG PO TABS
20.0000 mg | ORAL_TABLET | Freq: Every day | ORAL | Status: DC
  Administered 2016-06-02 – 2016-06-04 (×3): 20 mg via ORAL
  Filled 2016-06-01 (×3): qty 1

## 2016-06-01 MED ORDER — FAMOTIDINE 20 MG PO TABS
20.0000 mg | ORAL_TABLET | Freq: Every day | ORAL | Status: DC
  Administered 2016-06-02 – 2016-06-04 (×3): 20 mg via ORAL
  Filled 2016-06-01 (×3): qty 1

## 2016-06-01 MED ORDER — ATORVASTATIN CALCIUM 40 MG PO TABS
80.0000 mg | ORAL_TABLET | Freq: Every day | ORAL | Status: DC
  Administered 2016-06-01: 80 mg via ORAL
  Filled 2016-06-01: qty 2

## 2016-06-01 MED ORDER — ASPIRIN EC 81 MG PO TBEC
81.0000 mg | DELAYED_RELEASE_TABLET | Freq: Every day | ORAL | Status: DC
  Administered 2016-06-02 – 2016-06-04 (×3): 81 mg via ORAL
  Filled 2016-06-01 (×3): qty 1

## 2016-06-01 MED ORDER — HYDROCODONE-ACETAMINOPHEN 5-325 MG PO TABS: 1.0000 | ORAL_TABLET | Freq: Four times a day (QID) | ORAL | Status: DC | PRN

## 2016-06-01 MED ORDER — BENZONATATE 100 MG PO CAPS
100.0000 mg | ORAL_CAPSULE | Freq: Three times a day (TID) | ORAL | Status: DC | PRN
  Administered 2016-06-01: 100 mg via ORAL
  Filled 2016-06-01: qty 1

## 2016-06-01 MED ORDER — PHENOBARBITAL 32.4 MG PO TABS
64.8000 mg | ORAL_TABLET | Freq: Every morning | ORAL | Status: DC
  Administered 2016-06-02 – 2016-06-03 (×2): 64.8 mg via ORAL
  Filled 2016-06-01 (×2): qty 2

## 2016-06-01 MED ORDER — MAGNESIUM CHLORIDE 64 MG PO TBEC
1.0000 | DELAYED_RELEASE_TABLET | Freq: Every day | ORAL | Status: DC
  Administered 2016-06-01 – 2016-06-04 (×4): 64 mg via ORAL
  Filled 2016-06-01 (×6): qty 1

## 2016-06-01 MED ORDER — MAGNESIUM HYDROXIDE 400 MG/5ML PO SUSP
30.0000 mL | Freq: Every day | ORAL | Status: DC | PRN
  Administered 2016-06-03 (×2): 30 mL via ORAL
  Filled 2016-06-01 (×2): qty 30

## 2016-06-01 MED ORDER — STROKE: EARLY STAGES OF RECOVERY BOOK: Freq: Once | Status: DC

## 2016-06-01 MED ORDER — PHENYTOIN SODIUM EXTENDED 100 MG PO CAPS
300.0000 mg | ORAL_CAPSULE | Freq: Every day | ORAL | Status: DC
  Filled 2016-06-01: qty 3

## 2016-06-01 NOTE — ED Notes (Signed)
Bed: [id number] Expected date:  Expected time:  Means of arrival:  Comments: 82 yo FTT

## 2016-06-01 NOTE — Progress Notes (Signed)
MEDICATION RELATED CONSULT NOTE - INITIAL   Pharmacy Consult for phenytoin Indication: seizure disorder  Allergies  Allergen Reactions  . Penicillins Rash    Has patient had a PCN reaction causing immediate rash, facial/tongue/throat swelling, SOB or lightheadedness with hypotension: Yes Has patient had a PCN reaction causing severe rash involving mucus membranes or skin necrosis: No Has patient had a PCN reaction that required hospitalization No Has patient had a PCN reaction occurring within the last 10 years: No If all of the above answers are "NO", then may proceed with Cephalosporin use.     Patient Measurements: Height: 6\' 1"  (185.4 cm) Weight: 166 lb 10.7 oz (75.6 kg) IBW/kg (Calculated) : 79.9  Vital Signs: Temp: 97.9 F (36.6 C) (09/13 1653) Temp Source: Oral (09/13 1653) BP: 144/59 (09/13 1653) Pulse Rate: 60 (09/13 1653) Intake/Output from previous day: No intake/output data recorded. Intake/Output from this shift: Total I/O In: -  Out: 400 [Urine:400]  Labs:  Recent Labs  06/01/16 1400  WBC 12.7*  HGB 9.4*  HCT 29.0*  PLT 331  CREATININE 0.76  ALBUMIN 3.1*  PROT 6.9  AST 30  ALT 31  ALKPHOS 190*  BILITOT 0.6   Estimated Creatinine Clearance: 76.1 mL/min (by C-G formula based on SCr of 0.76 mg/dL).   Microbiology: Recent Results (from the past 720 hour(s))  Culture, Urine     Status: None   Collection Time: 05/03/16  6:20 PM  Result Value Ref Range Status   Specimen Description URINE, RANDOM  Final   Special Requests NONE  Final   Culture NO GROWTH  Final   Report Status 05/04/2016 FINAL  Final    Medical History: Past Medical History:  Diagnosis Date  . Allergy    pcn  . Arteriovenous malformation    ([place])  . Blood transfusion    right eye  no surgery as yet  . Bradycardia   . CAD (coronary artery disease)    a. 04/27/2016 PCI with DES to RCA with 50% ostial to 60% segmental mid to distal left main, and 90% thrombus filled ostial  to proximal circumflex, with distal right coronary filled by collaterals from left-to-right  . Constipation   . ED (erectile dysfunction)   . GERD (gastroesophageal reflux disease)   . Hypercholesterolemia   . Hypertension   . Muscle weakness   . Prostate cancer ([place]) 11/02/11 bxs   Adenocarcinoma,Gleason=3+3=6 & 3+4=7,PSA+1.58 Volume=19cc  . Prostate cancer ([place])   . Seizures ([place])   . STEMI (ST elevation myocardial infarction) ([place])   . Vitamin D deficiency     Medications:  Prescriptions Prior to Admission  Medication Sig Dispense Refill Last Dose  . aspirin EC 81 MG tablet Take 1 tablet (81 mg total) by mouth daily. 90 tablet 3 06/01/2016 at Unknown time  . atorvastatin (LIPITOR) 80 MG tablet Take 1 tablet (80 mg total) by mouth daily at 6 PM. 30 tablet 12 05/31/2016 at Unknown time  . azithromycin (ZITHROMAX) 250 MG tablet Take 250-500 mg by mouth as directed. Take 500mg  on day 1, then 250mg  qd for 4 days..   05/31/2016 at Unknown time  . clopidogrel (PLAVIX) 75 MG tablet Take 1 tablet (75 mg total) by mouth daily. 30 tablet 12 06/01/2016 at 0740  . ergocalciferol (VITAMIN D2) 50000 units capsule Take 50,000 Units by mouth once a week.   06/01/2016 at Unknown time  . guaiFENesin (MUCINEX) 600 MG 12 hr tablet Take 1,200 mg by mouth 2 (two) times daily.  06/01/2016 at Unknown time  . HYDROcodone-acetaminophen (NORCO) 5-325 MG tablet Take 1 tablet by mouth every 6 (six) hours as needed for moderate pain. 10 tablet 0 unknown  . magnesium hydroxide (MILK OF MAGNESIA) 400 MG/5ML suspension Take 30 mLs by mouth daily as needed for mild constipation. 360 mL 0 05/31/2016 at Unknown time  . Multiple Vitamins-Minerals (ICAPS AREDS FORMULA PO) Take 1-2 capsules by mouth 2 (two) times daily. Take 1 capsule in the morning, and 2 capsules in evening   06/01/2016 at Unknown time  . PHENobarbital (LUMINAL) 64.8 MG tablet Take 1 tablet (64.8 mg total) by mouth every morning. 30 tablet 4 06/01/2016 at Unknown  time  . PHENobarbital (LUMINAL) 97.2 MG tablet Take 1 tablet (97.2 mg total) by mouth every evening. 30 tablet 4 05/31/2016 at Unknown time  . phenytoin (DILANTIN) 100 MG ER capsule Take 200-300 mg by mouth 2 (two) times daily. Take 200mg  by mouth each morning. Take 300mg  by mouth each evening.   06/01/2016 at Unknown time  . predniSONE (DELTASONE) 20 MG tablet Take 20 mg by mouth daily with breakfast.   06/01/2016 at Unknown time  . ranitidine (ZANTAC) 300 MG tablet Take 300 mg by mouth daily.   06/01/2016 at Unknown time   Scheduled:  .  stroke: mapping our early stages of recovery book   Does not apply Once  . [START ON 06/02/2016] aspirin EC  81 mg Oral Daily  . atorvastatin  80 mg Oral q1800  . [START ON 06/02/2016] clopidogrel  75 mg Oral Daily  . [START ON 06/02/2016] famotidine  20 mg Oral Daily  . guaiFENesin  1,200 mg Oral BID  . magnesium chloride  1 tablet Oral Daily  . [START ON 06/02/2016] PHENobarbital  64.8 mg Oral q morning - 10a  . PHENobarbital  97.2 mg Oral QPM  . [START ON 06/02/2016] phenytoin  200 mg Oral q morning - 10a  . phenytoin  300 mg Oral QHS  . [START ON 06/02/2016] predniSONE  20 mg Oral Q breakfast    Assessment: 82yo w/ recent MI. Admitted 9/13 from SNF w/ weakness and slurred speech. Phenytoin level elevated. Phenobarb level in process. Pharmacy is asked to manage phenytoin.   Pht level = 25.1, albumin 3.1, so corrected phenytoin ~34.9.  Goal of Therapy:  Phenytoin level: 10-31mcg/ml   Plan:  Hold phenytoin for now. Recheck level in the AM.  Romeo Rabon, PharmD, pager [phone]. 06/01/2016,6:06 PM.

## 2016-06-01 NOTE — ED Notes (Signed)
Patient transported to X-ray 

## 2016-06-01 NOTE — H&P (Signed)
History and Physical    Joshua [name] [id number] DOB: May 14, 1932 DOA: 06/01/2016  Referring MD/NP/PA: Dr. Alvino Chapel PCP: Redge Gainer, MD Outpatient Specialists: Dr. Tamala Julian Patient coming from: [facility]  Chief Complaint: weakness  HPI: Joshua [name] is a 82 y.o. male with medical history significant of recent MI.  [facility].  Patient and family reports he did well for the first week.   Then he developed a "cold"-- coughing up clear mucous and has been getting weaker since then.  He was seen at the cardiology office on 8/28 and the was a concern for an irregular heart beat and patient was placed on holter monitor 9/7 for ? Block?.  Family is also reporting slurred speech.  + SOB, + orthopnea, no chest pain  ED Course: IN the ER,his Hgb was found to be lower than previous and he was heme +, hospitalist were asked to place in observation for possible GI bleed causing FTT.  He is also on dilantin and phenobarb-- levels pending at this time X ray showed COPD changes with out infiltrate.  Review of Systems: all systems reviewed, negative unless stated above in HPI   Past Medical History:  Diagnosis Date  . Allergy    pcn  . Arteriovenous malformation    (CNS)  . Blood transfusion    right eye  no surgery as yet  . Bradycardia   . CAD (coronary artery disease)    a. 04/27/2016 PCI with DES to RCA with 50% ostial to 60% segmental mid to distal left main, and 90% thrombus filled ostial to proximal circumflex, with distal right coronary filled by collaterals from left-to-right  . Constipation   . ED (erectile dysfunction)   . GERD (gastroesophageal reflux disease)   . Hypercholesterolemia   . Hypertension   . Muscle weakness   . Prostate cancer ([place]) 11/02/11 bxs   Adenocarcinoma,Gleason=3+3=6 & 3+4=7,PSA+1.58 Volume=19cc  . Prostate cancer ([place])   . Seizures ([place])   . STEMI (ST elevation myocardial infarction) ([place])   . Vitamin D deficiency       Past Surgical History:  Procedure Laterality Date  . ABDOMINAL SURGERY    . APPENDECTOMY    . CARDIAC CATHETERIZATION N/A 04/27/2016   Procedure: Left Heart Cath and Coronary Angiography;  Surgeon: Belva Crome, MD;  Location: [place] [facility];  Service: Cardiovascular;  Laterality: N/A;  . CARDIAC CATHETERIZATION N/A 04/27/2016   Procedure: Temporary Pacemaker;  Surgeon: Belva Crome, MD;  Location: [place] [facility];  Service: Cardiovascular;  Laterality: N/A;  . CARDIAC CATHETERIZATION N/A 04/27/2016   Procedure: Coronary Stent Intervention;  Surgeon: Belva Crome, MD;  Location: [place] [facility];  Service: Cardiovascular;  Laterality: N/A;  Proximal RCA Mid RCA  . CORONARY STENT PLACEMENT    . CRANIOTOMY  1980  . CYSTOSCOPY  01/19/2012   Procedure: CYSTOSCOPY;  Surgeon: Malka So, MD;  Location: [facility];  Service: Urology;;  no seeds found in bladder  . CYSTOSCOPY W/ RETROGRADES Bilateral 04/19/2016   Procedure: CYSTOSCOPY WITH BILATERAL RETROGRADE PYELOGRAM TRANSURETHRAL RESECTION OF BLADDER TUMOR ;  Surgeon: [name], MD;  Location: [facility];  Service: Urology;  Laterality: Bilateral;  . RADIOACTIVE SEED IMPLANT  01/19/2012   Procedure: RADIOACTIVE SEED IMPLANT;  Surgeon: Malka So, MD;  Location: [facility];  Service: Urology;  Laterality: N/A;  68 seeds implanted     reports that he quit smoking about 43  years ago. He has a 30.00 pack-year smoking history. He has never used smokeless tobacco. He reports that he drinks alcohol. He reports that he does not use drugs.  Allergies  Allergen Reactions  . Penicillins Rash    Has patient had a PCN reaction causing immediate rash, facial/tongue/throat swelling, SOB or lightheadedness with hypotension: Yes Has patient had a PCN reaction causing severe rash involving mucus membranes or skin necrosis: No Has patient had a PCN reaction that required hospitalization No Has patient had a PCN reaction  occurring within the last 10 years: No If all of the above answers are "NO", then may proceed with Cephalosporin use.     Family History  Problem Relation Age of Onset  . Emphysema Mother     age 23 deceased  . Stroke Mother   . Emphysema Father     deceased age 70     Prior to Admission medications   Medication Sig Start Date End Date Taking? Authorizing Provider  aspirin EC 81 MG tablet Take 1 tablet (81 mg total) by mouth daily. 05/16/16  Yes Isaiah Serge, NP  atorvastatin (LIPITOR) 80 MG tablet Take 1 tablet (80 mg total) by mouth daily at 6 PM. 05/07/16  Yes Arbutus Leas, NP  azithromycin (ZITHROMAX) 250 MG tablet Take 250-500 mg by mouth as directed. Take 500mg  on day 1, then 250mg  qd for 4 days..   Yes Historical Provider, MD  clopidogrel (PLAVIX) 75 MG tablet Take 1 tablet (75 mg total) by mouth daily. 05/07/16  Yes Arbutus Leas, NP  ergocalciferol (VITAMIN D2) 50000 units capsule Take 50,000 Units by mouth once a week.   Yes Historical Provider, MD  guaiFENesin (MUCINEX) 600 MG 12 hr tablet Take 1,200 mg by mouth 2 (two) times daily.   Yes Historical Provider, MD  HYDROcodone-acetaminophen (NORCO) 5-325 MG tablet Take 1 tablet by mouth every 6 (six) hours as needed for moderate pain. 04/19/16  Yes [name], MD  magnesium hydroxide (MILK OF MAGNESIA) 400 MG/5ML suspension Take 30 mLs by mouth daily as needed for mild constipation. 05/07/16  Yes Arbutus Leas, NP  Multiple Vitamins-Minerals (ICAPS AREDS FORMULA PO) Take 1-2 capsules by mouth 2 (two) times daily. Take 1 capsule in the morning, and 2 capsules in evening   Yes Historical Provider, MD  PHENobarbital (LUMINAL) 64.8 MG tablet Take 1 tablet (64.8 mg total) by mouth every morning. 05/07/16  Yes Arbutus Leas, NP  PHENobarbital (LUMINAL) 97.2 MG tablet Take 1 tablet (97.2 mg total) by mouth every evening. 05/07/16  Yes Arbutus Leas, NP  phenytoin (DILANTIN) 100 MG ER capsule Take 200-300 mg by mouth 2 (two) times daily. Take  200mg  by mouth each morning. Take 300mg  by mouth each evening.   Yes Historical Provider, MD  predniSONE (DELTASONE) 20 MG tablet Take 20 mg by mouth daily with breakfast.   Yes Historical Provider, MD  ranitidine (ZANTAC) 300 MG tablet Take 300 mg by mouth daily.   Yes Historical Provider, MD    Physical Exam: Vitals:   06/01/16 1329 06/01/16 1332 06/01/16 1333 06/01/16 1517  BP: 136/77   152/68  Pulse: 67   80  Resp: 25   (!) 27  Temp: 97.8 F (36.6 C)     TempSrc: Oral     SpO2: 94%  95% 94%  Weight:  80.3 kg (177 lb)    Height:  6' (1.829 m)        Constitutional: elderly, chronically ill  appearing Vitals:   06/01/16 1329 06/01/16 1332 06/01/16 1333 06/01/16 1517  BP: 136/77   152/68  Pulse: 67   80  Resp: 25   (!) 27  Temp: 97.8 F (36.6 C)     TempSrc: Oral     SpO2: 94%  95% 94%  Weight:  80.3 kg (177 lb)    Height:  6' (1.829 m)     Eyes: PERRL, lids and conjunctivae normal ENMT: Mucous membranes are dry. Posterior pharynx clear of any exudate or lesions.Normal dentition.  Neck: normal, supple, no masses, no thyromegaly Respiratory: clear to auscultation bilaterally, no wheezing, no crackles. Normal respiratory effort. No accessory muscle use.  Cardiovascular: irregular, no murmurs / rubs / gallops. No extremity edema.  Abdomen: no tenderness, no masses palpated. No hepatosplenomegaly. Bowel sounds positive.  Musculoskeletal: no clubbing / cyanosis. No joint deformity upper and lower extremities. Good [name], no contractures. Normal muscle tone.  Skin: no rashes, lesions, ulcers. No induration Neurologic: mild difficulty with finger to nose, vision intact Psychiatric: Normal judgment and insight. Alert and oriented x 3. Normal mood.     Labs on Admission: I have personally reviewed following labs and imaging studies  CBC:  Recent Labs Lab 06/01/16 1400  WBC 12.7*  NEUTROABS 10.2*  HGB 9.4*  HCT 29.0*  MCV 93.9  PLT [id number]   Basic Metabolic  Panel:  Recent Labs Lab 06/01/16 1400  NA 137  K 4.3  CL 104  CO2 27  GLUCOSE 129*  BUN 21*  CREATININE 0.76  CALCIUM 8.6*   GFR: Estimated Creatinine Clearance: 78.1 mL/min (by C-G formula based on SCr of 0.76 mg/dL). Liver Function Tests:  Recent Labs Lab 06/01/16 1400  AST 30  ALT 31  ALKPHOS 190*  BILITOT 0.6  PROT 6.9  ALBUMIN 3.1*   No results for input(s): LIPASE, AMYLASE in the last 168 hours. No results for input(s): AMMONIA in the last 168 hours. Coagulation Profile: No results for input(s): INR, PROTIME in the last 168 hours. Cardiac Enzymes:  Recent Labs Lab 06/01/16 1400  TROPONINI <0.03   BNP (last 3 results) No results for input(s): PROBNP in the last 8760 hours. HbA1C: No results for input(s): HGBA1C in the last 72 hours. CBG: No results for input(s): GLUCAP in the last 168 hours. Lipid Profile: No results for input(s): CHOL, HDL, LDLCALC, TRIG, CHOLHDL, LDLDIRECT in the last 72 hours. Thyroid Function Tests: No results for input(s): TSH, T4TOTAL, FREET4, T3FREE, THYROIDAB in the last 72 hours. Anemia Panel: No results for input(s): VITAMINB12, FOLATE, FERRITIN, TIBC, IRON, RETICCTPCT in the last 72 hours. Urine analysis:    Component Value Date/Time   COLORURINE YELLOW 06/01/2016 1347   APPEARANCEUR CLOUDY (A) 06/01/2016 1347   APPEARANCEUR Clear 01/20/2016 0959   LABSPEC 1.026 06/01/2016 1347   PHURINE 7.0 06/01/2016 1347   GLUCOSEU NEGATIVE 06/01/2016 1347   HGBUR NEGATIVE 06/01/2016 1347   BILIRUBINUR NEGATIVE 06/01/2016 1347   BILIRUBINUR Negative 01/20/2016 0959   KETONESUR NEGATIVE 06/01/2016 1347   PROTEINUR NEGATIVE 06/01/2016 1347   UROBILINOGEN negative 09/10/2014 1233   UROBILINOGEN 0.2 09/23/2009 2141   NITRITE NEGATIVE 06/01/2016 1347   LEUKOCYTESUR TRACE (A) 06/01/2016 1347   LEUKOCYTESUR Negative 01/20/2016 0959   Sepsis Labs: Invalid input(s): PROCALCITONIN, LACTICIDVEN No results found for this or any previous  visit (from the past 240 hour(s)).   Radiological Exams on Admission: Dg Chest 2 View  Result Date: 06/01/2016 CLINICAL DATA:  Cough, weakness, history prostate cancer, hypertension, coronary artery  disease post stenting and coronary PTCA EXAM: CHEST  2 VIEW COMPARISON:  05/02/2016 FINDINGS: Minimal enlargement of cardiac silhouette. Atherosclerotic calcification aorta. Mediastinal contours and pulmonary vascularity normal. Emphysematous changes with mild central peribronchial thickening. No acute infiltrate, pleural effusion or pneumothorax. Bones demineralized. IMPRESSION: Enlargement of cardiac silhouette. COPD changes without acute infiltrate. Aortic atherosclerosis. Electronically Signed   By: Lavonia Dana M.D.   On: 06/01/2016 14:22    EKG: Independently reviewed. Difficult tracing to read but prolonged P intraval  Assessment/Plan Active Problems:   COLONIC POLYPS, ADENOMATOUS, HX OF   ST elevation (STEMI) myocardial infarction involving other coronary artery of inferior wall (HCC)   GI bleed   Slurred speech   FTT/weakness -? Etiology- multifactorial-- r/o CVA, CHF-- does not appear infectious -started on steroids and abx at SNF recently  Slurred speech -patient's HR felt irregular- watch on tele- cards consult for holter monitor (message sent to card master) -MRI  Heme + stools -CBC daily -on ASA/plavix after recent heart procedure -h/o colonic polyps  Recent STEMI -cards consult -echo  SOB -COPD on x ray -no wheezing on exam -check echo   DVT prophylaxis: scd Code Status: full Family Communication: son at bedside Disposition Plan: back to SNF Consults called: LB cards (message sent to card master) Admission status: tele obs   Pickett DO [facility] Pager [phone]  If 7PM-7AM, please contact night-coverage [email or web address] Password [email or web address]  06/01/2016, 4:16 PM

## 2016-06-01 NOTE — ED Triage Notes (Signed)
Per EMS- Patient is a resident of [facility] in [place]. Facility called for  failure to thrive. Staff reported that the patient normally walks on his own, but has needed assistance for the past 2 days. Patient also has been taking a Z-pack for URI. Rhonchi present bilateral lower lobes.

## 2016-06-01 NOTE — ED Provider Notes (Signed)
[place] DEPT Provider Note   CSN: [id number] Arrival date & time: 06/01/16  1313     History   Chief Complaint Chief Complaint  Patient presents with  . Weakness    HPI Joshua [name] is a 82 y.o. male.  HPI Patient presents with generalized weakness. Recently had MI and his been in rehabilitation since then. Has been doing worse. Patient states when he got there he was able to walk on his own but now needs assistance to walk for the last couple days. He's had a cough with some clear sputum production. No fevers. No chest pain. Patient states his tongue feels very dry. No further hematuria. No dysuria. No back pain. States he does feel weak and somewhat aches all over.   Past Medical History:  Diagnosis Date  . Allergy    pcn  . Arteriovenous malformation    ([facility])  . Blood transfusion    right eye  no surgery as yet  . Bradycardia   . CAD (coronary artery disease)    a. 04/27/2016 PCI with DES to RCA with 50% ostial to 60% segmental mid to distal left main, and 90% thrombus filled ostial to proximal circumflex, with distal right coronary filled by collaterals from left-to-right  . Constipation   . ED (erectile dysfunction)   . GERD (gastroesophageal reflux disease)   . Hypercholesterolemia   . Hypertension   . Muscle weakness   . Prostate cancer ([place]) 11/02/11 bxs   Adenocarcinoma,Gleason=3+3=6 & 3+4=7,PSA+1.58 Volume=19cc  . Prostate cancer ([place])   . Seizures ([place])   . STEMI (ST elevation myocardial infarction) ([place])   . Vitamin D deficiency     Patient Active Problem List   Diagnosis Date Noted  . GI bleed 06/01/2016  . Pyrexia   . Leukocytosis   . 1St degree AV block   . Cardiogenic shock ([place])   . AV block, [place] 2 04/27/2016  . ST elevation (STEMI) myocardial infarction involving other coronary artery of inferior wall ([place]) 04/27/2016  . STEMI (ST elevation myocardial infarction) ([place]) 04/27/2016  . Bladder cancer ([place]) 04/18/2016  . Vitamin D  deficiency 10/15/2015  . Prostate cancer ([place])   . Hypercholesterolemia   . Essential hypertension   . Seizures ([place])   . GERD 08/22/2008  . COLONIC POLYPS, ADENOMATOUS, HX OF 08/22/2008    Past Surgical History:  Procedure Laterality Date  . ABDOMINAL SURGERY    . APPENDECTOMY    . CARDIAC CATHETERIZATION N/A 04/27/2016   Procedure: Left Heart Cath and Coronary Angiography;  Surgeon: Belva Crome, MD;  Location: [place];  Service: Cardiovascular;  Laterality: N/A;  . CARDIAC CATHETERIZATION N/A 04/27/2016   Procedure: Temporary Pacemaker;  Surgeon: Belva Crome, MD;  Location: [facility];  Service: Cardiovascular;  Laterality: N/A;  . CARDIAC CATHETERIZATION N/A 04/27/2016   Procedure: Coronary Stent Intervention;  Surgeon: Belva Crome, MD;  Location: [facility];  Service: Cardiovascular;  Laterality: N/A;  Proximal RCA Mid RCA  . CORONARY STENT PLACEMENT    . CRANIOTOMY  1980  . CYSTOSCOPY  01/19/2012   Procedure: CYSTOSCOPY;  Surgeon: Malka So, MD;  Location: [facility];  Service: Urology;;  no seeds found in bladder  . CYSTOSCOPY W/ RETROGRADES Bilateral 04/19/2016   Procedure: CYSTOSCOPY WITH BILATERAL RETROGRADE PYELOGRAM TRANSURETHRAL RESECTION OF BLADDER TUMOR ;  Surgeon: [name], MD;  Location[facility];  Service: Urology;  Laterality: Bilateral;  . RADIOACTIVE SEED IMPLANT  01/19/2012   Procedure: RADIOACTIVE SEED IMPLANT;  Surgeon: Malka So, MD;  Location: [facility];  Service: Urology;  Laterality: N/A;  59 seeds implanted       Home Medications    Prior to Admission medications   Medication Sig Start Date End Date Taking? Authorizing Provider  aspirin EC 81 MG tablet Take 1 tablet (81 mg total) by mouth daily. 05/16/16  Yes Isaiah Serge, NP  atorvastatin (LIPITOR) 80 MG tablet Take 1 tablet (80 mg total) by mouth daily at 6 PM. 05/07/16  Yes Arbutus Leas, NP  azithromycin (ZITHROMAX) 250 MG tablet Take  250-500 mg by mouth as directed. Take 500mg  on day 1, then 250mg  qd for 4 days..   Yes Historical Provider, MD  clopidogrel (PLAVIX) 75 MG tablet Take 1 tablet (75 mg total) by mouth daily. 05/07/16  Yes Arbutus Leas, NP  ergocalciferol (VITAMIN D2) 50000 units capsule Take 50,000 Units by mouth once a week.   Yes Historical Provider, MD  guaiFENesin (MUCINEX) 600 MG 12 hr tablet Take 1,200 mg by mouth 2 (two) times daily.   Yes Historical Provider, MD  HYDROcodone-acetaminophen (NORCO) 5-325 MG tablet Take 1 tablet by mouth every 6 (six) hours as needed for moderate pain. 04/19/16  Yes [name], MD  magnesium hydroxide (MILK OF MAGNESIA) 400 MG/5ML suspension Take 30 mLs by mouth daily as needed for mild constipation. 05/07/16  Yes Arbutus Leas, NP  Multiple Vitamins-Minerals (ICAPS AREDS FORMULA PO) Take 1-2 capsules by mouth 2 (two) times daily. Take 1 capsule in the morning, and 2 capsules in evening   Yes Historical Provider, MD  PHENobarbital (LUMINAL) 64.8 MG tablet Take 1 tablet (64.8 mg total) by mouth every morning. 05/07/16  Yes Arbutus Leas, NP  PHENobarbital (LUMINAL) 97.2 MG tablet Take 1 tablet (97.2 mg total) by mouth every evening. 05/07/16  Yes Arbutus Leas, NP  phenytoin (DILANTIN) 100 MG ER capsule Take 200-300 mg by mouth 2 (two) times daily. Take 200mg  by mouth each morning. Take 300mg  by mouth each evening.   Yes Historical Provider, MD  predniSONE (DELTASONE) 20 MG tablet Take 20 mg by mouth daily with breakfast.   Yes Historical Provider, MD  ranitidine (ZANTAC) 300 MG tablet Take 300 mg by mouth daily.   Yes Historical Provider, MD    Family History Family History  Problem Relation Age of Onset  . Emphysema Mother     age 71 deceased  . Stroke Mother   . Emphysema Father     deceased age 10    Social History Social History  Substance Use Topics  . Smoking status: Former Smoker    Packs/day: 2.00    Years: 15.00    Quit date: 01/15/1973  . Smokeless tobacco: Never  Used  . Alcohol use Yes     Comment: occasionally     Allergies   Penicillins   Review of Systems Review of Systems  Constitutional: Positive for fatigue. Negative for appetite change and fever.  HENT: Positive for trouble swallowing. Negative for sore throat.   Respiratory: Positive for cough.   Cardiovascular: Negative for chest pain.  Gastrointestinal: Negative for abdominal pain.  Genitourinary: Negative for frequency.  Musculoskeletal: Positive for myalgias. Negative for back pain.  Skin: Negative for wound.  Neurological: Positive for weakness.  Psychiatric/Behavioral: Negative for confusion.     Physical Exam Updated Vital Signs BP 136/77   Pulse 67   Temp 97.8 F (36.6  C) (Oral)   Resp 25   Ht 6' (1.829 m)   Wt 177 lb (80.3 kg)   SpO2 95% Comment: O2 4L/min via [place]  BMI 24.01 kg/m   Physical Exam  Constitutional: He appears well-developed.  HENT:  Head: Normocephalic.  Tongue is very dry  Eyes: Pupils are equal, round, and reactive to light.  Neck: Neck supple.  Cardiovascular: Normal rate.   Pulmonary/Chest:  Rales bilateral bases.  Abdominal: Soft. He exhibits no distension.  Musculoskeletal: He exhibits no edema.  Neurological: He is alert.  Skin: Skin is warm. Capillary refill takes less than 2 seconds.  Psychiatric: He has a normal mood and affect.     ED Treatments / Results  Labs (all labs ordered are listed, but only abnormal results are displayed) Labs Reviewed  COMPREHENSIVE METABOLIC PANEL - Abnormal; Notable for the following:       Result Value   Glucose, Bld 129 (*)    BUN 21 (*)    Calcium 8.6 (*)    Albumin 3.1 (*)    Alkaline Phosphatase 190 (*)    All other components within normal limits  BRAIN NATRIURETIC PEPTIDE - Abnormal; Notable for the following:    B Natriuretic Peptide 107.1 (*)    All other components within normal limits  CBC WITH DIFFERENTIAL/PLATELET - Abnormal; Notable for the following:    WBC 12.7 (*)     RBC 3.09 (*)    Hemoglobin 9.4 (*)    HCT 29.0 (*)    RDW 16.9 (*)    Neutro Abs 10.2 (*)    All other components within normal limits  URINALYSIS, ROUTINE W REFLEX MICROSCOPIC (NOT AT [facility]) - Abnormal; Notable for the following:    APPearance CLOUDY (*)    Leukocytes, UA TRACE (*)    All other components within normal limits  URINE MICROSCOPIC-ADD ON - Abnormal; Notable for the following:    Squamous Epithelial / LPF 0-5 (*)    Bacteria, UA FEW (*)    Casts HYALINE CASTS (*)    All other components within normal limits  POC OCCULT BLOOD, ED - Abnormal; Notable for the following:    Fecal Occult Bld POSITIVE (*)    All other components within normal limits  TROPONIN I  PHENYTOIN LEVEL, TOTAL  PHENOBARBITAL LEVEL    EKG  EKG Interpretation  Date/Time:  Wednesday June 01 2016 13:29:33 EDT Ventricular Rate:  69 PR Interval:    QRS Duration: 95 QT Interval:  312 QTC Calculation: 335 R Axis:   11 Text Interpretation:  Sinus rhythm Prolonged PR interval Low voltage, precordial leads Confirmed by Alvino Chapel  MD, Ovid Curd [phone]) on 06/01/2016 1:41:54 PM       Radiology Dg Chest 2 View  Result Date: 06/01/2016 CLINICAL DATA:  Cough, weakness, history prostate cancer, hypertension, coronary artery disease post stenting and coronary PTCA EXAM: CHEST  2 VIEW COMPARISON:  05/02/2016 FINDINGS: Minimal enlargement of cardiac silhouette. Atherosclerotic calcification aorta. Mediastinal contours and pulmonary vascularity normal. Emphysematous changes with mild central peribronchial thickening. No acute infiltrate, pleural effusion or pneumothorax. Bones demineralized. IMPRESSION: Enlargement of cardiac silhouette. COPD changes without acute infiltrate. Aortic atherosclerosis. Electronically Signed   By: Lavonia Dana M.D.   On: 06/01/2016 14:22    Procedures Procedures (including critical care time)  Medications Ordered in ED Medications - No data to display   Initial Impression /  Assessment and Plan / ED Course  I have reviewed the triage vital signs and the nursing  notes.  Pertinent labs & imaging results that were available during my care of the patient were reviewed by me and considered in my medical decision making (see chart for details).  Clinical Course    Patient with generalized weakness. Hemoglobin has decreased and has positive guaiac. Has dark brown stool. Also very dry mouth. Urine as questioned on flexion but has also had similar findings recently now sent Dilantin and phenobarbital levels. Will admit to internal medicine.  Final Clinical Impressions(s) / ED Diagnoses   Final diagnoses:  Weakness  Dehydration  Anemia, unspecified anemia type  Gastrointestinal hemorrhage, unspecified gastritis, unspecified gastrointestinal hemorrhage type    New Prescriptions New Prescriptions   No medications on file     Davonna Belling, MD 06/01/16 1507

## 2016-06-01 NOTE — Progress Notes (Signed)
Dilantin level elevated-- will hold dosage-- pharmacy consult [place]

## 2016-06-02 ENCOUNTER — Observation Stay (HOSPITAL_BASED_OUTPATIENT_CLINIC_OR_DEPARTMENT_OTHER): Payer: Medicare Other

## 2016-06-02 ENCOUNTER — Encounter (HOSPITAL_COMMUNITY): Payer: Self-pay | Admitting: Physician Assistant

## 2016-06-02 DIAGNOSIS — I441 Atrioventricular block, second degree: Secondary | ICD-10-CM

## 2016-06-02 DIAGNOSIS — R4781 Slurred speech: Secondary | ICD-10-CM | POA: Diagnosis not present

## 2016-06-02 DIAGNOSIS — R001 Bradycardia, unspecified: Secondary | ICD-10-CM

## 2016-06-02 DIAGNOSIS — I509 Heart failure, unspecified: Secondary | ICD-10-CM | POA: Diagnosis not present

## 2016-06-02 DIAGNOSIS — I2119 ST elevation (STEMI) myocardial infarction involving other coronary artery of inferior wall: Secondary | ICD-10-CM

## 2016-06-02 DIAGNOSIS — R531 Weakness: Secondary | ICD-10-CM

## 2016-06-02 DIAGNOSIS — E43 Unspecified severe protein-calorie malnutrition: Secondary | ICD-10-CM

## 2016-06-02 DIAGNOSIS — R627 Adult failure to thrive: Secondary | ICD-10-CM | POA: Diagnosis not present

## 2016-06-02 DIAGNOSIS — R634 Abnormal weight loss: Secondary | ICD-10-CM

## 2016-06-02 HISTORY — PX: TRANSTHORACIC ECHOCARDIOGRAM: SHX275

## 2016-06-02 LAB — BASIC METABOLIC PANEL
Anion gap: 6 (ref 5–15)
BUN: 16 mg/dL (ref 6–20)
CALCIUM: 8.8 mg/dL — AB (ref 8.9–10.3)
CO2: 27 mmol/L (ref 22–32)
CREATININE: 0.68 mg/dL (ref 0.61–1.24)
Chloride: 106 mmol/L (ref 101–111)
Glucose, Bld: 103 mg/dL — ABNORMAL HIGH (ref 65–99)
Potassium: 3.9 mmol/L (ref 3.5–5.1)
SODIUM: 139 mmol/L (ref 135–145)

## 2016-06-02 LAB — CBC
HCT: 28.5 % — ABNORMAL LOW (ref 39.0–52.0)
Hemoglobin: 9.1 g/dL — ABNORMAL LOW (ref 13.0–17.0)
MCH: 30 pg (ref 26.0–34.0)
MCHC: 31.9 g/dL (ref 30.0–36.0)
MCV: 94.1 fL (ref 78.0–100.0)
Platelets: 335 10*3/uL (ref 150–400)
RBC: 3.03 MIL/uL — ABNORMAL LOW (ref 4.22–5.81)
RDW: 16.9 % — AB (ref 11.5–15.5)
WBC: 12.4 10*3/uL — AB (ref 4.0–10.5)

## 2016-06-02 LAB — ECHOCARDIOGRAM COMPLETE
HEIGHTINCHES: 73 in
Weight: 2656 oz

## 2016-06-02 LAB — PHENYTOIN LEVEL, TOTAL: Phenytoin Lvl: 21.5 ug/mL — ABNORMAL HIGH (ref 10.0–20.0)

## 2016-06-02 MED ORDER — PHENYTOIN SODIUM EXTENDED 100 MG PO CAPS
200.0000 mg | ORAL_CAPSULE | Freq: Two times a day (BID) | ORAL | Status: DC
  Administered 2016-06-02 – 2016-06-04 (×4): 200 mg via ORAL
  Filled 2016-06-02 (×4): qty 2

## 2016-06-02 MED ORDER — ATORVASTATIN CALCIUM 10 MG PO TABS
10.0000 mg | ORAL_TABLET | Freq: Every day | ORAL | Status: DC
  Administered 2016-06-02 – 2016-06-03 (×2): 10 mg via ORAL
  Filled 2016-06-02 (×2): qty 1

## 2016-06-02 NOTE — Evaluation (Signed)
Physical Therapy Evaluation Patient Details Name: Joshua [name] MRN: [id number] DOB: 1931-12-02 Today's Date: 06/02/2016   History of Present Illness  82 yo male admitted with GI bleed, weakness. Hx of recent MI (~ 1 month prior to this admission per family), bradycardia, HTN, CAD, prostate cancer, Sz.  Clinical Impression  On eval, pt required Mod assist for mobility. He stood ~20-30 seconds with a RW. Mobility limited by weakness, decreased activity tolerance, impaired balance. Also noted bil UE and LE muscle jerking/twithcing while sitting and standing. Did not attempt transfer for safety reasons. Recommend return to SNF for continued rehab. Daughter present during session and stated plan is for pt to return to SNF. Will follow and progress activity as tolerated.     Follow Up Recommendations SNF    Equipment Recommendations  Rolling walker with 5" wheels    Recommendations for Other Services OT consult     Precautions / Restrictions Precautions Precautions: Fall Restrictions Weight Bearing Restrictions: No      Mobility  Bed Mobility Overal bed mobility: Needs Assistance Bed Mobility: Supine to Sit;Sit to Supine     Supine to sit: Min assist Sit to supine: Min assist   General bed mobility comments: Assist for R LE and trunk. Increased time. Reliance on bedrail. Impaired balance noted while sitting EOB.   Transfers Overall transfer level: Needs assistance Equipment used: Rolling walker (2 wheeled) Transfers: Sit to/from Stand Sit to Stand: Mod assist;From elevated surface         General transfer comment: assist to rise, stabilize, control descent. Pt stood for ~20-30 seconds before requesting to return to supine. Noted UE and LE muscle jerking during standing as well. Increased RR rate noted with minimal activity.  Ambulation/Gait             General Gait Details: NT for safety reasons. +2 assist not available  Stairs            Wheelchair  Mobility    Modified Rankin (Stroke Patients Only)       Balance Overall balance assessment: Needs assistance Sitting-balance support: Feet supported;No upper extremity supported;Bilateral upper extremity supported Sitting balance-Leahy Scale: Fair Sitting balance - Comments: Increased multidirectional swaying noted with initial sitting. Intermittent Min assist required to stabilize especially when UE support removed/during reaching. Postural control: Left lateral lean;Posterior lean Standing balance support: Bilateral upper extremity supported Standing balance-Leahy Scale: Poor                               Pertinent Vitals/Pain Pain Assessment: No/denies pain    Home Living Family/patient expects to be discharged to:: Unsure                      Prior Function Level of Independence: Needs assistance   Gait / Transfers Assistance Needed: pt is from a SNF for rehabiliation. Requiring +2 assist for mobility just prior to this admission           Hand Dominance        Extremity/Trunk Assessment   Upper Extremity Assessment:  (bil UE muscle jerking noted once sitting EOB)           Lower Extremity Assessment: RLE deficits/detail (bil muscle jerking noted once sitting EOB) RLE Deficits / Details: strength ~3/5 throughout. some baseline weakness per pt.    Cervical / Trunk Assessment: Normal  Communication   Communication: No difficulties  Cognition Arousal/Alertness: Awake/alert Behavior  During Therapy: WFL for tasks assessed/performed Overall Cognitive Status: Within Functional Limits for tasks assessed                      General Comments      Exercises        Assessment/Plan    PT Assessment Patient needs continued PT services  PT Diagnosis Difficulty walking;Generalized weakness   PT Problem List Decreased strength;Decreased mobility;Decreased activity tolerance;Decreased balance;Decreased coordination  PT Treatment  Interventions DME instruction;Gait training;Therapeutic activities;Therapeutic exercise;Balance training;Patient/family education;Functional mobility training   PT Goals (Current goals can be found in the Care Plan section) Acute Rehab PT Goals Patient Stated Goal: to be able to walk. to get stronger. PT Goal Formulation: With patient/family Time For Goal Achievement: 06/16/16 Potential to Achieve Goals: Good    Frequency Min 3X/week   Barriers to discharge        Co-evaluation               End of Session Equipment Utilized During Treatment: Gait belt Activity Tolerance: Patient limited by fatigue Patient left: in bed;with call bell/phone within reach;with bed alarm set;with family/visitor present      Functional Assessment Tool Used: clinical judgement Functional Limitation: Mobility: Walking and moving around Mobility: Walking and Moving Around Current Status [id number]): At least 20 percent but less than 40 percent impaired, limited or restricted Mobility: Walking and Moving Around Goal Status [phone]): At least 1 percent but less than 20 percent impaired, limited or restricted    Time: 1121-1144 PT Time Calculation (min) (ACUTE ONLY): 23 min   Charges:   PT Evaluation $PT Eval Low Complexity: 1 Procedure PT Treatments $Therapeutic Activity: 8-22 mins   PT G Codes:   PT G-Codes **NOT FOR INPATIENT CLASS** Functional Assessment Tool Used: clinical judgement Functional Limitation: Mobility: Walking and moving around Mobility: Walking and Moving Around Current Status [id number]): At least 20 percent but less than 40 percent impaired, limited or restricted Mobility: Walking and Moving Around Goal Status [phone]): At least 1 percent but less than 20 percent impaired, limited or restricted    Weston Anna, MPT Pager: [phone]

## 2016-06-02 NOTE — Clinical Social Work Note (Signed)
Clinical Social Work Assessment  Patient Details  Name: Joshua [name] MRN: [id number] Date of Birth: [birth year]  Date of referral:  06/02/16               Reason for consult:  Discharge Planning                Permission sought to share information with:  Family Supports, Case Manager, Chartered certified accountant granted to share information::  Yes, Verbal Permission Granted  Name::      Jon Gills )  Agency::   [facility] )  Relationship::   (Daughter )  Sport and exercise psychologist Information:   [phone])  Housing/Transportation Living arrangements for the past 2 months:  [place] of Information:  Adult Children Patient Interpreter Needed:  None Criminal Activity/Legal Involvement Pertinent to Current Situation/Hospitalization:  No - Comment as needed Significant Relationships:  Adult Children Lives with:  Self Do you feel safe going back to the place where you live?  No Need for family participation in patient care:  Yes (Comment)  Care giving concerns: Patient admitted from [facility] where he is a [place] resident.    Social Worker assessment / plan:  MSW spoke with patient's dtr, Vaughan Basta to confirm patient was admitted from [facility]. Pt's dtr confirmed SNF and stated that patient has been there for about 3 weeks. MSW reported chart and noted patient was dc'ed from [facility] on 8/22. Family would like for patient to return to SNF. Pt's dtr understands that Medicare only covers 20 days at 100% and on day 21 there is a possibility that secondary insurance, [employer] will pick up 20% co pays if patient has that benefit with [place]. MSW also explained 60 day wellness period with Medicare. No further concerns reported at this time. MSW remains available as needed.   Employment status:  Retired Health visitor, Managed Care PT Recommendations:  Not assessed at this time Information / Referral to community resources:  [place]  Patient/Family's Response to care:  Pt a/o x4. Pt and family agreeable to return to SNF. Pt's dtr pleasant, supportive, involved in care and appreciated social work intervention.   Patient/Family's Understanding of and Emotional Response to Diagnosis, Current Treatment, and Prognosis:  Pt's dtr aware of medical interventions and current obs status.   Emotional Assessment Appearance:  Appears stated age Attitude/Demeanor/Rapport:  Unable to Assess Affect (typically observed):  Unable to Assess Orientation:  Oriented to Self, Oriented to Place, Oriented to  Time, Oriented to Situation Alcohol / Substance use:  Not Applicable Psych involvement (Current and /or in the community):  No (Comment)  Discharge Needs  Concerns to be addressed:  Denies Needs/Concerns at this time Readmission within the last 30 days:  Yes Current discharge risk:  None Barriers to Discharge:      Glendon Axe, MSW [phone] 06/02/2016 12:09 PM

## 2016-06-02 NOTE — Progress Notes (Signed)
Spoke with pt daughter, Braddock Thorngren, by phone.  She contacted [facility] and they stated they had mailed the monitor back on 09/13. Left msg for Royal Piedra at the office to f/u on this and try to get results.  Rosaria Ferries, Hershal Coria 06/02/2016 11:35 AM Beeper [phone]

## 2016-06-02 NOTE — Progress Notes (Addendum)
*  PRELIMINARY RESULTS* Vascular Ultrasound Carotid Duplex (Doppler) has been completed.  Preliminary findings: Bilateral: No significant (1-39%) ICA stenosis. Right ICA borderline >40%. Antegrade vertebral flow.   Landry Mellow, RDMS, RVT  06/02/2016, 9:12 AM

## 2016-06-02 NOTE — Progress Notes (Signed)
  Echocardiogram 2D Echocardiogram has been performed.  Joshua [name] 06/02/2016, 3:04 PM

## 2016-06-02 NOTE — Progress Notes (Signed)
MEDICATION RELATED CONSULT NOTE - follow up  Pharmacy Consult for phenytoin Indication: seizure disorder  Allergies  Allergen Reactions  . Penicillins Rash    Has patient had a PCN reaction causing immediate rash, facial/tongue/throat swelling, SOB or lightheadedness with hypotension: Yes Has patient had a PCN reaction causing severe rash involving mucus membranes or skin necrosis: No Has patient had a PCN reaction that required hospitalization No Has patient had a PCN reaction occurring within the last 10 years: No If all of the above answers are "NO", then may proceed with Cephalosporin use.     Patient Measurements: Height: 6\' 1"  (185.4 cm) Weight: 166 lb 10.7 oz (75.6 kg) IBW/kg (Calculated) : 79.9  Vital Signs: Temp: 98.2 F (36.8 C) (09/14 0413) Temp Source: Oral (09/14 0413) BP: 138/75 (09/14 0413) Pulse Rate: 69 (09/14 0413) Intake/Output from previous day: 09/13 0701 - 09/14 0700 In: 120 [P.O.:120] Out: 600 [Urine:600] Intake/Output from this shift: No intake/output data recorded.  Labs:  Recent Labs  06/01/16 1400 06/02/16 0452  WBC 12.7* 12.4*  HGB 9.4* 9.1*  HCT 29.0* 28.5*  PLT 331 335  CREATININE 0.76 0.68  ALBUMIN 3.1*  --   PROT 6.9  --   AST 30  --   ALT 31  --   ALKPHOS 190*  --   BILITOT 0.6  --    Estimated Creatinine Clearance: 76.1 mL/min (by C-G formula based on SCr of 0.68 mg/dL).   Microbiology: Recent Results (from the past 720 hour(s))  Culture, Urine     Status: None   Collection Time: 05/03/16  6:20 PM  Result Value Ref Range Status   Specimen Description URINE, RANDOM  Final   Special Requests NONE  Final   Culture NO GROWTH  Final   Report Status 05/04/2016 FINAL  Final  MRSA PCR Screening     Status: None   Collection Time: 06/01/16  6:30 PM  Result Value Ref Range Status   MRSA by PCR NEGATIVE NEGATIVE Final    Comment:        The GeneXpert MRSA Assay (FDA approved for NASAL specimens only), is one component of  a comprehensive MRSA colonization surveillance program. It is not intended to diagnose MRSA infection nor to guide or monitor treatment for MRSA infections.     Medical History: Past Medical History:  Diagnosis Date  . Allergy    pcn  . Arteriovenous malformation    (CNS)  . Blood transfusion    right eye  no surgery as yet  . Bradycardia   . CAD (coronary artery disease)    a. 04/27/2016 PCI with DES to RCA with 50% ostial to 60% segmental mid to distal left main, and 90% thrombus filled ostial to proximal circumflex, with distal right coronary filled by collaterals from left-to-right  . Constipation   . ED (erectile dysfunction)   . GERD (gastroesophageal reflux disease)   . Hypercholesterolemia   . Hypertension   . Muscle weakness   . Prostate cancer ([place]) 11/02/11 bxs   Adenocarcinoma,Gleason=3+3=6 & 3+4=7,PSA+1.58 Volume=19cc  . Prostate cancer ([place])   . Seizures ([place])   . STEMI (ST elevation myocardial infarction) ([place])   . Vitamin D deficiency     Medications:  Prescriptions Prior to Admission  Medication Sig Dispense Refill Last Dose  . aspirin EC 81 MG tablet Take 1 tablet (81 mg total) by mouth daily. 90 tablet 3 06/01/2016 at Unknown time  . atorvastatin (LIPITOR) 80 MG tablet Take 1 tablet (80  mg total) by mouth daily at 6 PM. 30 tablet 12 05/31/2016 at Unknown time  . azithromycin (ZITHROMAX) 250 MG tablet Take 250-500 mg by mouth as directed. Take 500mg  on day 1, then 250mg  qd for 4 days..   05/31/2016 at Unknown time  . clopidogrel (PLAVIX) 75 MG tablet Take 1 tablet (75 mg total) by mouth daily. 30 tablet 12 06/01/2016 at 0740  . ergocalciferol (VITAMIN D2) 50000 units capsule Take 50,000 Units by mouth once a week.   06/01/2016 at Unknown time  . guaiFENesin (MUCINEX) 600 MG 12 hr tablet Take 1,200 mg by mouth 2 (two) times daily.   06/01/2016 at Unknown time  . HYDROcodone-acetaminophen (NORCO) 5-325 MG tablet Take 1 tablet by mouth every 6 (six) hours as needed  for moderate pain. 10 tablet 0 unknown  . magnesium hydroxide (MILK OF MAGNESIA) 400 MG/5ML suspension Take 30 mLs by mouth daily as needed for mild constipation. 360 mL 0 05/31/2016 at Unknown time  . Multiple Vitamins-Minerals (ICAPS AREDS FORMULA PO) Take 1-2 capsules by mouth 2 (two) times daily. Take 1 capsule in the morning, and 2 capsules in evening   06/01/2016 at Unknown time  . PHENobarbital (LUMINAL) 64.8 MG tablet Take 1 tablet (64.8 mg total) by mouth every morning. 30 tablet 4 06/01/2016 at Unknown time  . PHENobarbital (LUMINAL) 97.2 MG tablet Take 1 tablet (97.2 mg total) by mouth every evening. 30 tablet 4 05/31/2016 at Unknown time  . phenytoin (DILANTIN) 100 MG ER capsule Take 200-300 mg by mouth 2 (two) times daily. Take 200mg  by mouth each morning. Take 300mg  by mouth each evening.   06/01/2016 at Unknown time  . predniSONE (DELTASONE) 20 MG tablet Take 20 mg by mouth daily with breakfast.   06/01/2016 at Unknown time  . ranitidine (ZANTAC) 300 MG tablet Take 300 mg by mouth daily.   06/01/2016 at Unknown time   Scheduled:  . aspirin EC  81 mg Oral Daily  . atorvastatin  80 mg Oral q1800  . clopidogrel  75 mg Oral Daily  . famotidine  20 mg Oral Daily  . guaiFENesin  1,200 mg Oral BID  . magnesium chloride  1 tablet Oral Daily  . PHENobarbital  64.8 mg Oral q morning - 10a  . PHENobarbital  97.2 mg Oral QPM  . predniSONE  20 mg Oral Q breakfast    Assessment: 82yo w/ recent MI. Admitted 9/13 from SNF w/ weakness and slurred speech. Phenytoin level elevated. Phenobarb level in process. Pharmacy is asked to manage phenytoin.   Home dose phenytoin 200mg  in am and 300mg  in pm  9/13 Pht level = 25.1, albumin 3.1,  corrected phenytoin ~34.9. 9/14 Pht level= 21.5, corrected phenytoin 29.8  Goal of Therapy:  Phenytoin level: 10-46mcg/ml   Plan:  decrease evening Phenytoin dose to 200mg  and start tonight. Recheck level in the AM.  Dolly Rias RPh 06/02/2016, 11:00 AM Pager  [phone]

## 2016-06-02 NOTE — NC FL2 (Signed)
[place] LEVEL OF CARE SCREENING TOOL     IDENTIFICATION  Patient Name: Joshua [name] Birthdate: 1932-08-23 Sex: male Admission Date (Current Location): 06/01/2016  [facility] and [place] Number:  Herbalist and Address:  [facility],  [place] [address] [place]      Provider Number: [id number]  Attending Physician Name and Address:  Robbie Lis, MD  Relative Name and Phone Number:       Current Level of Care: Hospital Recommended Level of Care: [place] Prior Approval Number:    Date Approved/Denied:   PASRR Number: [id number]  Discharge Plan: SNF    Current Diagnoses: Patient Active Problem List   Diagnosis Date Noted  . Bradycardia 06/02/2016  . GI bleed 06/01/2016  . Slurred speech 06/01/2016  . Pyrexia   . Leukocytosis   . 1St degree AV block   . Cardiogenic shock ([place])   . AV block, [place] 2 04/27/2016  . ST elevation (STEMI) myocardial infarction involving other coronary artery of inferior wall ([place]) 04/27/2016  . STEMI (ST elevation myocardial infarction) ([place]) 04/27/2016  . Bladder cancer ([place]) 04/18/2016  . Vitamin D deficiency 10/15/2015  . Prostate cancer ([place])   . Hypercholesterolemia   . Essential hypertension   . Seizures ([place])   . GERD 08/22/2008  . COLONIC POLYPS, ADENOMATOUS, HX OF 08/22/2008    Orientation RESPIRATION BLADDER Height & Weight     Self, Time, Situation, Place  Normal Continent Weight: 166 lb 10.7 oz (75.6 kg) Height:  6\' 1"  (185.4 cm)  BEHAVIORAL SYMPTOMS/MOOD NEUROLOGICAL BOWEL NUTRITION STATUS   (none) Convulsions/Seizures Continent Diet (HEART HEALTHY )  AMBULATORY STATUS COMMUNICATION OF NEEDS Skin   Limited Assist Verbally Normal                       Personal Care Assistance Level of Assistance  Bathing, Feeding, Dressing Bathing Assistance: Limited assistance Feeding assistance: Independent Dressing Assistance: Limited assistance      Functional Limitations Info  Speech, Hearing, Sight Sight Info: Adequate Hearing Info: Adequate Speech Info: Adequate    SPECIAL CARE FACTORS FREQUENCY  PT (By licensed PT)     PT Frequency: 3              Contractures      Additional Factors Info  Code Status, Allergies Code Status Info: FULL CODE  Allergies Info: Penicillins            Current Medications (06/02/2016):  This is the current hospital active medication list Current Facility-Administered Medications  Medication Dose Route Frequency Provider Last Rate Last Dose  . aspirin EC tablet 81 mg  81 mg Oral Daily Geradine Girt, DO   81 mg at 06/02/16 1010  . atorvastatin (LIPITOR) tablet 80 mg  80 mg Oral q1800 Geradine Girt, DO   80 mg at 06/01/16 1757  . benzonatate (TESSALON) capsule 100 mg  100 mg Oral TID PRN Geradine Girt, DO   100 mg at 06/01/16 1756  . clopidogrel (PLAVIX) tablet 75 mg  75 mg Oral Daily Geradine Girt, DO   75 mg at 06/02/16 1010  . famotidine (PEPCID) tablet 20 mg  20 mg Oral Daily Jessica U Vann, DO   20 mg at 06/02/16 1009  . guaiFENesin (MUCINEX) 12 hr tablet 1,200 mg  1,200 mg Oral BID Geradine Girt, DO   1,200 mg at 06/02/16 1009  . HYDROcodone-acetaminophen (NORCO/VICODIN) 5-325 MG  per tablet 1 tablet  1 tablet Oral Q6H PRN Geradine Girt, DO      . magnesium chloride (SLOW-MAG) 64 MG SR tablet 64 mg  1 tablet Oral Daily Jessica U Vann, DO   64 mg at 06/02/16 1010  . magnesium hydroxide (MILK OF MAGNESIA) suspension 30 mL  30 mL Oral Daily PRN Geradine Girt, DO      . PHENobarbital (LUMINAL) tablet 64.8 mg  64.8 mg Oral q morning - 10a Jessica U Vann, DO   64.8 mg at 06/02/16 1010  . PHENobarbital (LUMINAL) tablet 97.2 mg  97.2 mg Oral QPM Jessica U Vann, DO   97.2 mg at 06/01/16 1757  . phenytoin (DILANTIN) ER capsule 200 mg  200 mg Oral BID Robbie Lis, MD      . predniSONE (DELTASONE) tablet 20 mg  20 mg Oral Q breakfast Geradine Girt, DO   20 mg at 06/02/16 [id number]  .  senna-docusate (Senokot-S) tablet 1 tablet  1 tablet Oral QHS PRN Geradine Girt, DO         Discharge Medications: Please see discharge summary for a list of discharge medications.  Relevant Imaging Results:  Relevant Lab Results:   Additional Information SSN [id number]  Rozell Searing

## 2016-06-02 NOTE — Progress Notes (Addendum)
Patient ID: Joshua [name], male   DOB: 1932/01/03, 82 y.o.   MRN: [id number]  PROGRESS NOTE    Joshua [name]  [id number] DOB: Aug 14, 1932 DOA: 06/01/2016  PCP: Redge Gainer, MD   Brief Narrative:  82 y.o. male with past medical history significant for recent MI, status post stent placement. Patient did really well for the first week or so in skilled nursing facility but after that patient's daughter reported steady decline with progressive weight loss and less mobility. Patient also reported feeling very weak. His family noted slurred speech for which reason he was sent to ED for evaluation.  Patient was hemodynamically stable in ED. MRI of the brain did not reveal acute stroke. His phenytoin level was 25 on the admission. Chest x-ray showed no acute infiltrates.  Assessment & Plan:   Active Problems: Generalized weakness - Unclear etiology - Obtain physical therapy evaluation  - MRI without evidence of acute intracranial finding  Failure to thrive in adult / severe protein calorie malnutrition - Likely secondary from recent MI - Appreciate nutrition consult  History of recent MI status post stent placement - Continue aspirin and Plavix - No chest pain  History of seizures - Dilantin 200 mg twice daily. Dilantin level was 25 on the admission - Repeat Dilantin level 21.5   Dyslipidemia - Continue statin therapy   DVT prophylaxis: asa, plavix, SCD's bilaterally  Code Status: full code  Family Communication: daughter at the bedside this am Disposition Plan: To skilled nursing facility next 2-3 days   Consultants:   Physical therapy  Nutrition  Procedures:   None   Antimicrobials:   None     Subjective: No overnight events.  Objective: Vitals:   06/01/16 1653 06/01/16 1659 06/01/16 1948 06/02/16 0413  BP: (!) 144/59  (!) 143/58 138/75  Pulse: 60  75 69  Resp: 19   18  Temp: 97.9 F (36.6 C)  97.7 F (36.5 C) 98.2 F (36.8 C)  TempSrc: Oral  Oral  Oral  SpO2: 100%   99%  Weight:  75.6 kg (166 lb 10.7 oz)    Height:  6\' 1"  (1.854 m)      Intake/Output Summary (Last 24 hours) at 06/02/16 1229 Last data filed at 06/02/16 1110  Gross per 24 hour  Intake              240 ml  Output              600 ml  Net             -360 ml   Filed Weights   06/01/16 1332 06/01/16 1659  Weight: 80.3 kg (177 lb) 75.6 kg (166 lb 10.7 oz)    Examination:  General exam: Appears calm and comfortable  Respiratory system: Clear to auscultation. Respiratory effort normal. Cardiovascular system: S1 & S2 heard, RRR. No JVD. No pedal edema. Gastrointestinal system: Abdomen is nondistended, soft and nontender. No organomegaly or masses felt. Normal bowel sounds heard. Central nervous system: Alert and oriented. No focal neurological deficits. Extremities: Symmetric 5 x 5 power. Skin: No rashes, lesions or ulcers Psychiatry: Judgement and insight appear normal. Mood & affect appropriate.   Data Reviewed: I have personally reviewed following labs and imaging studies  CBC:  Recent Labs Lab 06/01/16 1400 06/02/16 0452  WBC 12.7* 12.4*  NEUTROABS 10.2*  --   HGB 9.4* 9.1*  HCT 29.0* 28.5*  MCV 93.9 94.1  PLT 331 [id number]   Basic Metabolic Panel:  Recent Labs Lab 06/01/16 1400 06/02/16 0452  NA 137 139  K 4.3 3.9  CL 104 106  CO2 27 27  GLUCOSE 129* 103*  BUN 21* 16  CREATININE 0.76 0.68  CALCIUM 8.6* 8.8*   GFR: Estimated Creatinine Clearance: 76.1 mL/min (by C-G formula based on SCr of 0.68 mg/dL). Liver Function Tests:  Recent Labs Lab 06/01/16 1400  AST 30  ALT 31  ALKPHOS 190*  BILITOT 0.6  PROT 6.9  ALBUMIN 3.1*   No results for input(s): LIPASE, AMYLASE in the last 168 hours. No results for input(s): AMMONIA in the last 168 hours. Coagulation Profile: No results for input(s): INR, PROTIME in the last 168 hours. Cardiac Enzymes:  Recent Labs Lab 06/01/16 1400  TROPONINI <0.03   BNP (last 3 results) No results  for input(s): PROBNP in the last 8760 hours. HbA1C: No results for input(s): HGBA1C in the last 72 hours. CBG: No results for input(s): GLUCAP in the last 168 hours. Lipid Profile: No results for input(s): CHOL, HDL, LDLCALC, TRIG, CHOLHDL, LDLDIRECT in the last 72 hours. Thyroid Function Tests: No results for input(s): TSH, T4TOTAL, FREET4, T3FREE, THYROIDAB in the last 72 hours. Anemia Panel: No results for input(s): VITAMINB12, FOLATE, FERRITIN, TIBC, IRON, RETICCTPCT in the last 72 hours. Urine analysis:    Component Value Date/Time   COLORURINE YELLOW 06/01/2016 1347   APPEARANCEUR CLOUDY (A) 06/01/2016 1347   APPEARANCEUR Clear 01/20/2016 0959   LABSPEC 1.026 06/01/2016 1347   PHURINE 7.0 06/01/2016 1347   GLUCOSEU NEGATIVE 06/01/2016 1347   HGBUR NEGATIVE 06/01/2016 1347   BILIRUBINUR NEGATIVE 06/01/2016 1347   BILIRUBINUR Negative 01/20/2016 0959   KETONESUR NEGATIVE 06/01/2016 1347   PROTEINUR NEGATIVE 06/01/2016 1347   UROBILINOGEN negative 09/10/2014 1233   UROBILINOGEN 0.2 09/23/2009 2141   NITRITE NEGATIVE 06/01/2016 1347   LEUKOCYTESUR TRACE (A) 06/01/2016 1347   LEUKOCYTESUR Negative 01/20/2016 0959   Sepsis Labs: @LABRCNTIP (procalcitonin:4,lacticidven:4)   Recent Results (from the past 240 hour(s))  MRSA PCR Screening     Status: None   Collection Time: 06/01/16  6:30 PM  Result Value Ref Range Status   MRSA by PCR NEGATIVE NEGATIVE Final      Radiology Studies: Dg Chest 2 View Result Date: 06/01/2016 Enlargement of cardiac silhouette. COPD changes without acute infiltrate. Aortic atherosclerosis. Electronically Signed   By: Lavonia Dana M.D.   On: 06/01/2016 14:22   Mr Brain Wo Contrast Result Date: 06/02/2016 No acute intracranial finding. Previous left parietal craniotomy with atrophy, encephalomalacia and gliosis at the left parietal vertex. Mild chronic small-vessel change in age related atrophy. Intracranial MR angiography of the large and medium  size vessels is normal. Electronically Signed   By: Nelson Chimes M.D.   On: 06/02/2016 07:01   Mr Jodene Nam Head/brain [id number] Cm Result Date: 06/02/2016 No acute intracranial finding. Previous left parietal craniotomy with atrophy, encephalomalacia and gliosis at the left parietal vertex. Mild chronic small-vessel change in age related atrophy. Intracranial MR angiography of the large and medium size vessels is normal. Electronically Signed   By: Nelson Chimes M.D.   On: 06/02/2016 07:01     Scheduled Meds: . aspirin EC  81 mg Oral Daily  . atorvastatin  80 mg Oral q1800  . clopidogrel  75 mg Oral Daily  . famotidine  20 mg Oral Daily  . guaiFENesin  1,200 mg Oral BID  . magnesium chloride  1 tablet Oral Daily  . PHENobarbital  64.8 mg Oral q morning -  10a  . PHENobarbital  97.2 mg Oral QPM  . phenytoin  200 mg Oral BID  . predniSONE  20 mg Oral Q breakfast   Continuous Infusions:    LOS: 0 days    Time spent: 15 minutes  Greater than 50% of the time spent on counseling and coordinating the care.   Leisa Lenz, MD [facility] Pager [phone]  If 7PM-7AM, please contact night-coverage [email or web address] Password [facility] 06/02/2016, 12:29 PM

## 2016-06-02 NOTE — Consult Note (Addendum)
CARDIOLOGY CONSULT NOTE   Patient ID: Joshua [name] MRN: [id number] DOB/AGE: [birth year] 82 y.o.  Admit date: 06/01/2016  Primary Physician   Redge Gainer, MD Primary Cardiologist   Dr Tamala Julian Reason for Consultation   Irregular HR Requesting MD: Dr Eliseo Squires  [id number] Joshua [name] is an 82 y.o. year old male with a history of sz, prostate CA, HTN, HLD, GERD.  Joshua/C 08/22 after STEMI complicated by severe bradycardia/Mobitz 2 second-degree heart block with heart rate noted in the upper 20s. DES RCA w/ 60% Lmain, 90% CFX w/ thrombus, EF 55%. TVP>>Mobitz II w/ HR 50-60 after 48 hr. 26 sec pause felt vagally mediated, EP saw, PPM not recommended.   Seen in office 08/28, ASA stopped for hematuria>>resumed. SR w/ 1st deg AVB. See in 2 weeks after Holter, decide if PPM needed. Reassess then and decide if recath or CABG indicated.  Pt admitted 09/13 for cough w/ clear sputum, weakness, H&H decreasing, heme + stools, ?slurred speech. Cards asked to see for Holter/HR, 48 hr Holter placed 09/07.   Pt feels he got weaker after he left the hospital. No longer able to walk due to weakness. He got a cold and that made him weak. No chest pain. He had no SOB at rest, but when he walked, he would feel fatigued and have DOE. No edema, no orthopnea or PND.   No palpitations, no presyncope or syncope. He had blurred vision before admission, but none here. It would only last a few minutes. He was careful moving, would stand for a minute before he tried to walk. He doesn't think the blurred vision was associated with position change, but he cannot remember for sure.    He has been eating and drinking very little since discharge. One reason is that he has to have help to get to the bathroom. Weight is down 10 lbs since Joshua/c 08/26.   Past Medical History:  Diagnosis Date  . Allergy    pcn  . Arteriovenous malformation    ([place])  . Blood transfusion    right eye  no surgery as yet  . Bradycardia   . CAD  (coronary artery disease)    a. 04/27/2016 PCI with DES to RCA with 50% ostial to 60% segmental mid to distal left main, and 90% thrombus filled ostial to proximal circumflex, with distal right coronary filled by collaterals from left-to-right  . Constipation   . ED (erectile dysfunction)   . GERD (gastroesophageal reflux disease)   . Hypercholesterolemia   . Hypertension   . Muscle weakness   . Prostate cancer ([place]) 11/02/11 bxs   Adenocarcinoma,Gleason=3+3=6 & 3+4=7,PSA+1.58 Volume=19cc  . Prostate cancer ([place])   . Seizures ([place])   . STEMI (ST elevation myocardial infarction) ([place])   . Vitamin Joshua deficiency      Past Surgical History:  Procedure Laterality Date  . ABDOMINAL SURGERY    . APPENDECTOMY    . CARDIAC CATHETERIZATION N/A 04/27/2016   Procedure: Left Heart Cath and Coronary Angiography;  Surgeon: Belva Crome, MD;  Location: [place] [facility];  Service: Cardiovascular;  Laterality: N/A;  . CARDIAC CATHETERIZATION N/A 04/27/2016   Procedure: Temporary Pacemaker;  Surgeon: Belva Crome, MD;  Location: [place] [facility];  Service: Cardiovascular;  Laterality: N/A;  . CARDIAC CATHETERIZATION N/A 04/27/2016   Procedure: Coronary Stent Intervention;  Surgeon: Belva Crome, MD;  Location: [place] [facility];  Service: Cardiovascular;  Laterality: N/A;  Proximal RCA  Mid RCA  . CORONARY STENT PLACEMENT    . CRANIOTOMY  1980  . CYSTOSCOPY  01/19/2012   Procedure: CYSTOSCOPY;  Surgeon: Malka So, MD;  Location: [facility];  Service: Urology;;  no seeds found in bladder  . CYSTOSCOPY W/ RETROGRADES Bilateral 04/19/2016   Procedure: CYSTOSCOPY WITH BILATERAL RETROGRADE PYELOGRAM TRANSURETHRAL RESECTION OF BLADDER TUMOR ;  Surgeon: [name], MD;  Location: [facility];  Service: Urology;  Laterality: Bilateral;  . RADIOACTIVE SEED IMPLANT  01/19/2012   Procedure: RADIOACTIVE SEED IMPLANT;  Surgeon: Malka So, MD;  Location[facility];  Service: Urology;   Laterality: N/A;  68 seeds implanted    Allergies  Allergen Reactions  . Penicillins Rash    Has patient had a PCN reaction causing immediate rash, facial/tongue/throat swelling, SOB or lightheadedness with hypotension: Yes Has patient had a PCN reaction causing severe rash involving mucus membranes or skin necrosis: No Has patient had a PCN reaction that required hospitalization No Has patient had a PCN reaction occurring within the last 10 years: No If all of the above answers are "NO", then may proceed with Cephalosporin use.     I have reviewed the patient's current medications . aspirin EC  81 mg Oral Daily  . atorvastatin  80 mg Oral q1800  . clopidogrel  75 mg Oral Daily  . famotidine  20 mg Oral Daily  . guaiFENesin  1,200 mg Oral BID  . magnesium chloride  1 tablet Oral Daily  . PHENobarbital  64.8 mg Oral q morning - 10a  . PHENobarbital  97.2 mg Oral QPM  . predniSONE  20 mg Oral Q breakfast     benzonatate, HYDROcodone-acetaminophen, magnesium hydroxide, senna-docusate  Prior to Admission medications   Medication Sig Start Date End Date Taking? Authorizing Provider  aspirin EC 81 MG tablet Take 1 tablet (81 mg total) by mouth daily. 05/16/16  Yes Isaiah Serge, NP  atorvastatin (LIPITOR) 80 MG tablet Take 1 tablet (80 mg total) by mouth daily at 6 PM. 05/07/16  Yes Arbutus Leas, NP  azithromycin (ZITHROMAX) 250 MG tablet Take 250-500 mg by mouth as directed. Take 500mg  on day 1, then 250mg  qd for 4 days..   Yes Historical Provider, MD  clopidogrel (PLAVIX) 75 MG tablet Take 1 tablet (75 mg total) by mouth daily. 05/07/16  Yes Arbutus Leas, NP  ergocalciferol (VITAMIN D2) 50000 units capsule Take 50,000 Units by mouth once a week.   Yes Historical Provider, MD  guaiFENesin (MUCINEX) 600 MG 12 hr tablet Take 1,200 mg by mouth 2 (two) times daily.   Yes Historical Provider, MD  HYDROcodone-acetaminophen (NORCO) 5-325 MG tablet Take 1 tablet by mouth every 6 (six) hours as  needed for moderate pain. 04/19/16  Yes [name], MD  magnesium hydroxide (MILK OF MAGNESIA) 400 MG/5ML suspension Take 30 mLs by mouth daily as needed for mild constipation. 05/07/16  Yes Arbutus Leas, NP  Multiple Vitamins-Minerals (ICAPS AREDS FORMULA PO) Take 1-2 capsules by mouth 2 (two) times daily. Take 1 capsule in the morning, and 2 capsules in evening   Yes Historical Provider, MD  PHENobarbital (LUMINAL) 64.8 MG tablet Take 1 tablet (64.8 mg total) by mouth every morning. 05/07/16  Yes Arbutus Leas, NP  PHENobarbital (LUMINAL) 97.2 MG tablet Take 1 tablet (97.2 mg total) by mouth every evening. 05/07/16  Yes Arbutus Leas, NP  phenytoin (DILANTIN) 100 MG ER capsule Take 200-300 mg by  mouth 2 (two) times daily. Take 200mg  by mouth each morning. Take 300mg  by mouth each evening.   Yes Historical Provider, MD  predniSONE (DELTASONE) 20 MG tablet Take 20 mg by mouth daily with breakfast.   Yes Historical Provider, MD  ranitidine (ZANTAC) 300 MG tablet Take 300 mg by mouth daily.   Yes Historical Provider, MD     Social History   Social History  . Marital status: Widowed    Spouse name: N/A  . Number of children: 2  . Years of education: N/A   Occupational History  . Retired    Social History Main Topics  . Smoking status: Former Smoker    Packs/day: 2.00    Years: 15.00    Quit date: 01/15/1973  . Smokeless tobacco: Never Used  . Alcohol use Yes     Comment: occasionally  . Drug use: No     Comment: quit smoking 40 years ago  . Sexual activity: Not on file   Other Topics Concern  . Not on file   Social History Narrative   [facility] to [facility] after MI for rehab.    Family Status  Relation Status  . Mother Deceased  . Father Deceased  . Sister Deceased at age 53   fell hit head,cranial bleed ,expired  . Brother Deceased   murdered age 44   Family History  Problem Relation Age of Onset  . Emphysema Mother     age 68 deceased  . Stroke Mother   . Emphysema  Father     deceased age 48     ROS:  Full 14 point review of systems complete and found to be negative unless listed above.  Physical Exam: Blood pressure 138/75, pulse 69, temperature 98.2 F (36.8 C), temperature source Oral, resp. rate 18, height 6\' 1"  (1.854 m), weight 166 lb 10.7 oz (75.6 kg), SpO2 99 %.  General: Well developed, well nourished, male in no acute distress Head: Eyes PERRLA, No xanthomas.   Normocephalic and atraumatic, oropharynx without edema or exudate. Dentition: fair Lungs: rales bases Heart: HRRR S1 S2, no rub/gallop, no murmur. pulses are 2+ all 4 extrem.   Neck: No carotid bruits. No lymphadenopathy.  JVD not elevated Abdomen: Bowel sounds present, abdomen soft and non-tender without masses or hernias noted. Msk:  No spine or cva tenderness. Generalized weakness, no joint deformities or effusions. Extremities: No clubbing or cyanosis. No edema.  Neuro: Alert and oriented X 3. No focal deficits noted. Psych:  Good affect, responds appropriately Skin: No rashes or lesions noted.  Labs:   Lab Results  Component Value Date   WBC 12.4 (H) 06/02/2016   HGB 9.1 (L) 06/02/2016   HCT 28.5 (L) 06/02/2016   MCV 94.1 06/02/2016   PLT 335 06/02/2016    Recent Labs Lab 06/01/16 1400 06/02/16 0452  NA 137 139  K 4.3 3.9  CL 104 106  CO2 27 27  BUN 21* 16  CREATININE 0.76 0.68  CALCIUM 8.6* 8.8*  PROT 6.9  --   BILITOT 0.6  --   ALKPHOS 190*  --   ALT 31  --   AST 30  --   GLUCOSE 129* 103*  ALBUMIN 3.1*  --     Recent Labs  06/01/16 1400  TROPONINI <0.03   B Natriuretic Peptide  Date/Time Value Ref Range Status  06/01/2016 02:00 PM 107.1 (H) 0.0 - 100.0 pg/mL Final   Lab Results  Component Value Date   CHOL  144 04/27/2016   HDL 43 04/27/2016   LDLCALC 85 04/27/2016   TRIG 80 04/27/2016   Echo: ordered  ECG:  09/13 SR, 1st deg AVB, poor quality tracing  Cath: 08/09  Ost Cx to Mid Cx lesion, 90 %stenosed.  Ost 1st Mrg to 1st Mrg  lesion, 50 %stenosed.  A STENT RESOLUTE INTEG [id number] drug eluting stent was successfully placed, and does not overlap previously placed stent.  Mid RCA to Dist RCA lesion, 100 %stenosed.  Post intervention, there is a 0% residual stenosis.  A STENT RESOLUTE INTEG [id number] drug eluting stent was successfully placed, and does not overlap previously placed stent.  Prox RCA lesion, 80 %stenosed.  Post intervention, there is a 0% residual stenosis.  The left ventricular ejection fraction is 45-50% by visual estimate.  The left ventricular systolic function is normal.   Acute inferior ST elevation myocardial infarction, complicated by Mobitz 2 second degree heart block with heart rate of 29 bpm and hypotension.  Successful insertion of temporary pacemaker wire for symptomatic bradycardia.  Total occlusion of the mid RCA reduced to 0% after drug-eluting stent implantation using a Resolute Integrity 2.75 x 30 mm drug-eluting stent postdilated to 3.0.  Successful angioplasty of the proximal right coronary from 80% to 0% with TIMI grade 3 flow using a resolute Integrity 3.0 x 18 mm DES postdilated to 3.25 mm in diameter.  Segmental 60% distal left main and 50% ostial involvement.  90% ostial to proximal circumflex containing thrombus.  Otherwise widely patent circumflex and LAD vessels.  Inferior wall akinesis. EF 50% with low LVEDP. RECOMMENDATION:  Continue temporary transvenous pacemaker until recovery of AV conduction.  Aggrastat 18 hours.  Aspirin and Brilinta.  Hopeful conservative management for 4-6 weeks then consider re-study to look at the circumflex coronary artery (may be required prior to discharge if recurrent anginal/ischemic symptoms) and make a decision at that time about the possibility of coronary bypass grafting for left main disease.  Radiology:  Dg Chest 2 View Result Date: 06/01/2016 CLINICAL DATA:  Cough, weakness, history prostate cancer, hypertension,  coronary artery disease post stenting and coronary PTCA EXAM: CHEST  2 VIEW COMPARISON:  05/02/2016 FINDINGS: Minimal enlargement of cardiac silhouette. Atherosclerotic calcification aorta. Mediastinal contours and pulmonary vascularity normal. Emphysematous changes with mild central peribronchial thickening. No acute infiltrate, pleural effusion or pneumothorax. Bones demineralized. IMPRESSION: Enlargement of cardiac silhouette. COPD changes without acute infiltrate. Aortic atherosclerosis. Electronically Signed   By: Lavonia Dana M.Joshua.   On: 06/01/2016 14:22   Mr Jodene Nam Head/brain [id number] Cm Result Date: 06/02/2016 CLINICAL DATA:  Slurred speech.  History of prostate cancer. EXAM: MRI HEAD WITHOUT CONTRAST MRA HEAD WITHOUT CONTRAST TECHNIQUE: Multiplanar, multiecho pulse sequences of the brain and surrounding structures were obtained without intravenous contrast. Angiographic images of the head were obtained using MRA technique without contrast. COMPARISON:  None. FINDINGS: MRI HEAD FINDINGS Diffusion imaging does not show any acute or subacute infarction. There are mild chronic small-vessel changes of the pons. There are a few old small vessel cerebellar infarctions. Cerebral hemispheres show generalized atrophy with mild chronic small-vessel disease of the white matter. There has been previous left parietal craniotomy and there is atrophy, encephalomalacia and gliosis of the left parietal lobe at the vertex. There is some hemosiderin deposition in that region. No evidence of neoplastic mass lesion, recent hemorrhage, hydrocephalus or extra-axial collection. No pituitary mass. There are inflammatory changes of the paranasal sinuses. Major vessels at the base of the brain show flow. MRA  HEAD FINDINGS Both internal carotid arteries are widely patent. The anterior and middle cerebral vessels are normal without proximal stenosis, aneurysm or vascular malformation. Both vertebral arteries are patent with the right being  dominant. Left vertebral artery terminates in PICA. Basilar artery is normal in caliber. Superior cerebellar and posterior cerebral arteries are patent. Large posterior communicating artery on the left. IMPRESSION: No acute intracranial finding. Previous left parietal craniotomy with atrophy, encephalomalacia and gliosis at the left parietal vertex. Mild chronic small-vessel change in age related atrophy. Intracranial MR angiography of the large and medium size vessels is normal. Electronically Signed   By: Landra Howze Chimes M.Joshua.   On: 06/02/2016 07:01   TTE: 06/02/16  ASSESSMENT AND PLAN:   The patient was seen today by Dr [name], the patient evaluated and the data reviewed.     Bradycardia - no sx here, it was only while asleep, HR not sustained < 50 - will contact office to get Holter results.    ST elevation (STEMI) myocardial infarction involving other coronary artery of inferior wall (HCC) - ASA initially stopped due to hematuria, but resumed because of recent MI and thrombotic CFX. - with H&H dropping, discuss stopping ASA and continuing Plavix only  - on statin, no BB due to bradycardia  Otherwise, per IM Active Problems:   COLONIC POLYPS, ADENOMATOUS, HX OF   GI bleed   Slurred speech  SignedLenoard Aden 06/02/2016 8:57 AM Beeper [id number]  Co-Sign MD   The patient was seen, examined and discussed with Rosaria Ferries, PA-C and I agree with the above.    82 y.o. year old male with a history of sz, prostate CA, HTN, HLD, GERD, s/p bladder adenocarcinoma resection on 04/19/16, s/p STEMI on 0000000 complicated by severe bradycardia/Mobitz 2 second-degree heart block with heart rate noted in the upper 20s. DES RCA w/ 60% Lmain, 90% CFX w/ thrombus, EF 55%. TVP>>Mobitz II w/ HR 50-60 after 48 hr. 26 sec pause felt vagally mediated, EP saw, PPM not recommended. Hematuria has resolved. Recommeded Holter to decide if PPM needed. Reassess then and decide if recath or CABG  indicated. Pt admitted 09/13 for cough w/ clear sputum, weakness, H&H decreasing, heme + stools, ?slurred speech. Cards asked to see for Holter/HR, 48 hr Holter placed 09/07.   Per daughter the patient has been declining over the last 3 months, he has lost 35 lbs, he is weak, has no energy, no appetite.   We have obtained his Holter and are awaiting analysis. Telemetry in the hospital shows SR during the day and an intermittent 2:1 2. AVB with ventricular rate 36 BPM during sleep. He has no prior h/o syncope or presyncope.  Repeat echocardiogram today shows LVEF 60-65% with grade 1 DD and low filling pressures.  I would start iv hydration.   Overall status declining, I am suspicious about underlying malignancy, the patient followed up with urology , pathology showed adenocarcinoma. He also has positive FOBT, he needs work up for underlying GI bleed, ? Malignancy, consider CT scan of the chest/abdomen/pelvis.  We will follow.    Ena Dawley 06/02/2016

## 2016-06-03 ENCOUNTER — Encounter: Payer: Self-pay | Admitting: Family Medicine

## 2016-06-03 DIAGNOSIS — E43 Unspecified severe protein-calorie malnutrition: Secondary | ICD-10-CM

## 2016-06-03 DIAGNOSIS — R627 Adult failure to thrive: Secondary | ICD-10-CM | POA: Diagnosis not present

## 2016-06-03 DIAGNOSIS — I2119 ST elevation (STEMI) myocardial infarction involving other coronary artery of inferior wall: Secondary | ICD-10-CM | POA: Diagnosis not present

## 2016-06-03 DIAGNOSIS — R531 Weakness: Secondary | ICD-10-CM | POA: Diagnosis not present

## 2016-06-03 DIAGNOSIS — R001 Bradycardia, unspecified: Secondary | ICD-10-CM | POA: Diagnosis not present

## 2016-06-03 DIAGNOSIS — I700 Atherosclerosis of aorta: Secondary | ICD-10-CM | POA: Insufficient documentation

## 2016-06-03 LAB — HEMOGLOBIN A1C
HEMOGLOBIN A1C: 5 % (ref 4.8–5.6)
MEAN PLASMA GLUCOSE: 97 mg/dL

## 2016-06-03 LAB — VAS US CAROTID
LCCAPSYS: 127 cm/s
LEFT ECA DIAS: -16 cm/s
LEFT VERTEBRAL DIAS: 12 cm/s
LICADDIAS: -17 cm/s
LICAPDIAS: -20 cm/s
LICAPSYS: -114 cm/s
Left CCA dist dias: -24 cm/s
Left CCA dist sys: -122 cm/s
Left CCA prox dias: 23 cm/s
Left ICA dist sys: -75 cm/s
RCCAPDIAS: 17 cm/s
RIGHT ECA DIAS: 17 cm/s
RIGHT VERTEBRAL DIAS: 12 cm/s
Right CCA prox sys: 110 cm/s
Right cca dist sys: -88 cm/s

## 2016-06-03 LAB — PHENYTOIN LEVEL, TOTAL: Phenytoin Lvl: 20.2 ug/mL — ABNORMAL HIGH (ref 10.0–20.0)

## 2016-06-03 MED ORDER — ENSURE ENLIVE PO LIQD
237.0000 mL | Freq: Two times a day (BID) | ORAL | Status: DC
  Administered 2016-06-03 – 2016-06-04 (×2): 237 mL via ORAL

## 2016-06-03 MED ORDER — CITALOPRAM HYDROBROMIDE 20 MG PO TABS
10.0000 mg | ORAL_TABLET | Freq: Every day | ORAL | Status: DC
  Administered 2016-06-03 – 2016-06-04 (×2): 10 mg via ORAL
  Filled 2016-06-03 (×2): qty 1

## 2016-06-03 MED ORDER — PHENOBARBITAL 32.4 MG PO TABS
64.8000 mg | ORAL_TABLET | Freq: Two times a day (BID) | ORAL | Status: DC
  Administered 2016-06-03 – 2016-06-04 (×2): 64.8 mg via ORAL
  Filled 2016-06-03 (×2): qty 2

## 2016-06-03 NOTE — Evaluation (Addendum)
Occupational Therapy Evaluation Patient Details Name: Joshua [name] MRN: [id number] DOB: Feb 22, 1932 Today's Date: 06/03/2016    History of Present Illness 82 yo male admitted with GI bleed, weakness. Hx of recent MI (~ 1 month prior to this admission per family), bradycardia, HTN, CAD, prostate cancer, Sz.   Clinical Impression   Pt was admitted for the above.  He was at [facility] for 3 weeks prior to this admission.  He will benefit from continued OT to increase safety and independence with adls, with min A level goals.  Pt currently needs mod +2 for safety for transfers, and mod +1 for sit to stand    Follow Up Recommendations  SNF    Equipment Recommendations  3 in 1 bedside comode    Recommendations for Other Services       Precautions / Restrictions Precautions Precautions: Fall Restrictions Weight Bearing Restrictions: Yes      Mobility Bed Mobility         Supine to sit: Mod assist     General bed mobility comments: assist for legs and trunk.  Pt used bedrail.  cues for technique  Transfers   Equipment used: 1 person hand held assist (second person stood by)   Sit to Stand: Mod assist;From elevated surface         General transfer comment: assist to rise and stabilize.  Pt did not stand completely erect.  Used sara steady second time to stand with..  Needed rest breaks.  Sat on seat, stood again, then sat into chair    Balance     Sitting balance-Leahy Scale:  (fair to poor)       Standing balance-Leahy Scale: Poor                              ADL Overall ADL's : Needs assistance/impaired     Grooming: Minimal assistance;Sitting   Upper Body Bathing: Minimal assitance;Sitting   Lower Body Bathing: Moderate assistance;+2 for safety/equipment;Sit to/from stand;Maximal assistance   Upper Body Dressing : Minimal assistance;Sitting   Lower Body Dressing: Moderate assistance;Maximal assistance;+2 for safety/equipment;Sit to/from  stand   Toilet Transfer: Moderate assistance;Stand-pivot;+2 for safety/equipment (to chair)             General ADL Comments: performed modified SPT to chair.  Brought sara steady to work on sit to stand and to assess for transfer back to bed.  Pt stood wtih this with mod A.  He was able to maintain trunk in neutral.  Initially when sitting EOB, pt needed bil ues for support and had trunk lean to L at times, requiring assistance to correct.  Provided light lidded mug.  Pt dropped this while OT was there; does not report he has a lot of spillage when using bil UEs     Vision     Perception     Praxis      Pertinent Vitals/Pain Pain Assessment: No/denies pain     Hand Dominance     Extremity/Trunk Assessment Upper Extremity Assessment Upper Extremity Assessment: Overall WFL for tasks assessed (strength WFLs but has tremors:  LUE "gave" after awhile)           Communication Communication Communication: No difficulties   Cognition Arousal/Alertness: Awake/alert Behavior During Therapy: WFL for tasks assessed/performed Overall Cognitive Status: Within Functional Limits for tasks assessed  General Comments       Exercises       Shoulder Instructions      Home Living Family/patient expects to be discharged to:: Skilled nursing facility Living Arrangements: Alone                                      Prior Functioning/Environment Level of Independence: Needs assistance  Gait / Transfers Assistance Needed: pt is from a SNF for rehabiliation. Requiring +2 assist for mobility just prior to this admission ADL's / Homemaking Assistance Needed: has needed assistance at SNF            OT Problem List: Decreased strength;Decreased activity tolerance;Impaired balance (sitting and/or standing);Cardiopulmonary status limiting activity;Decreased coordination   OT Treatment/Interventions: Self-care/ADL training;Energy  conservation;DME and/or AE instruction;Therapeutic activities;Patient/family education;Balance training    OT Goals(Current goals can be found in the care plan section) Acute Rehab OT Goals Patient Stated Goal: to be able to walk. to get stronger. OT Goal Formulation: With patient Time For Goal Achievement: 06/17/16 Potential to Achieve Goals: Good ADL Goals Pt Will Transfer to Toilet: with min assist;bedside commode;stand pivot transfer Additional ADL Goal #1: pt will perform UB adls and grooming from sitting supported with set up Additional ADL Goal #2: pt will perform LB adls from sit to stand with min A Additional ADL Goal #3: pt will sit unsupported for 5 minutes with min guard in preparation for ADLs Additional ADL Goal #4: pt will initiate a rest break without cues for energy conservation  OT Frequency: Min 2X/week   Barriers to D/C:            Co-evaluation              End of Session Nurse Communication: Mobility status;Need for lift equipment (NT present during initial transfer)  Activity Tolerance:  (tolerated well with rest breaks) Patient left: in chair;with call bell/phone within reach;with chair alarm set;with family/visitor present  Pt Diagnosis:  Generalized weakness Time: [id number] OT Time Calculation (min): 41 min Charges:  OT General Charges $OT Visit: 1 Procedure OT Evaluation $OT Eval Moderate Complexity: 1 Procedure OT Treatments $Self Care/Home Management : 8-22 mins $Therapeutic Activity: 8-22 mins G-Codes: OT G-codes **NOT FOR INPATIENT CLASS** Functional Assessment Tool Used: clinical judgment and observation Functional Limitation: Self care Self Care Current Status [id number]): At least 40 percent but less than 60 percent impaired, limited or restricted Self Care Goal Status [id number]): At least 20 percent but less than 40 percent impaired, limited or restricted  Joshua [name] 06/03/2016, 3:18 PM Joshua [name],  OTR/L [phone] 06/03/2016

## 2016-06-03 NOTE — Progress Notes (Signed)
Patient Name: Joshua [name] Date of Encounter: 06/03/2016  Active Problems:   COLONIC POLYPS, ADENOMATOUS, HX OF   ST elevation (STEMI) myocardial infarction involving other coronary artery of inferior wall (HCC)   GI bleed   Slurred speech   Bradycardia   Primary Cardiologist: Dr Tamala Julian Patient Profile: 82 y.o. year old male with a history of sz, prostate CA, bladder CA HTN, HLD, GERD. STEMI [id number] w/ DES RCA complicated by Mobitz II>HR improved and PPM not indicated. Op Holter results pending. Admitted 09/13, cards asked to see for arrhythmia  SUBJECTIVE: Does not feel like eating, but did so. Feels about the same as yesterday.  OBJECTIVE Vitals:   06/02/16 0413 06/02/16 1435 06/02/16 2135 06/03/16 0449  BP: 138/75 132/66 130/71 125/81  Pulse: 69 77 75 87  Resp: 18 18 18 18   Temp: 98.2 F (36.8 C) 98 F (36.7 C) 99 F (37.2 C) 98.7 F (37.1 C)  TempSrc: Oral Oral Oral Oral  SpO2: 99% 98% 100% 98%  Weight:      Height:        Intake/Output Summary (Last 24 hours) at 06/03/16 0848 Last data filed at 06/03/16 0845  Gross per 24 hour  Intake              120 ml  Output              750 ml  Net             -630 ml   Filed Weights   06/01/16 1332 06/01/16 1659  Weight: 177 lb (80.3 kg) 166 lb 10.7 oz (75.6 kg)    PHYSICAL EXAM General: Well developed, well nourished, male in no acute distress. Head: Normocephalic, atraumatic.  Neck: Supple without bruits, JVD. Lungs:  Resp regular and unlabored, CTA. Heart: RRR, S1, S2, no S3, S4, or murmur; no rub. Abdomen: Soft, non-tender, non-distended, BS + x 4.  Extremities: No clubbing, cyanosis, edema.  Neuro: Alert and oriented X 3. Moves all extremities spontaneously. Psych: Normal affect.  LABS: CBC:  Recent Labs  06/01/16 1400 06/02/16 0452  WBC 12.7* 12.4*  NEUTROABS 10.2*  --   HGB 9.4* 9.1*  HCT 29.0* 28.5*  MCV 93.9 94.1  PLT 331 [id number]   Basic Metabolic Panel:  Recent Labs  06/01/16 1400  06/02/16 0452  NA 137 139  K 4.3 3.9  CL 104 106  CO2 27 27  GLUCOSE 129* 103*  BUN 21* 16  CREATININE 0.76 0.68  CALCIUM 8.6* 8.8*   Liver Function Tests:  Recent Labs  06/01/16 1400  AST 30  ALT 31  ALKPHOS 190*  BILITOT 0.6  PROT 6.9  ALBUMIN 3.1*   Cardiac Enzymes:  Recent Labs  06/01/16 1400  TROPONINI <0.03   BNP:  B Natriuretic Peptide  Date/Time Value Ref Range Status  06/01/2016 02:00 PM 107.1 (H) 0.0 - 100.0 pg/mL Final   Hemoglobin A1C:  Recent Labs  06/02/16 0452  HGBA1C 5.0   TELE:  SR, 1st deg AVB, Mobitz I AVB      ECHO: 09/14 - Left ventricle: The cavity size was normal. Wall thickness was   normal. Systolic function was normal. The estimated ejection   fraction was in the range of 60% to 65%. Although no diagnostic   regional wall motion abnormality was identified, this possibility   cannot be completely excluded on the basis of this study. There   was an increased relative contribution of atrial contraction  to   ventricular filling, which may be due to hypovolemia. Left   ventricular diastolic function parameters were normal. Doppler   parameters are consistent with low ventricular filling pressure. - Mitral valve: Calcified annulus. - Pulmonary arteries: PA peak pressure: 34 mm Hg (S).  Radiology/Studies: Dg Chest 2 View  Result Date: 06/01/2016 CLINICAL DATA:  Cough, weakness, history prostate cancer, hypertension, coronary artery disease post stenting and coronary PTCA EXAM: CHEST  2 VIEW COMPARISON:  05/02/2016 FINDINGS: Minimal enlargement of cardiac silhouette. Atherosclerotic calcification aorta. Mediastinal contours and pulmonary vascularity normal. Emphysematous changes with mild central peribronchial thickening. No acute infiltrate, pleural effusion or pneumothorax. Bones demineralized. IMPRESSION: Enlargement of cardiac silhouette. COPD changes without acute infiltrate. Aortic atherosclerosis. Electronically Signed   By: Lavonia Dana M.D.   On: 06/01/2016 14:22   Mr Brain Wo Contrast  Result Date: 06/02/2016 CLINICAL DATA:  Slurred speech.  History of prostate cancer. EXAM: MRI HEAD WITHOUT CONTRAST MRA HEAD WITHOUT CONTRAST TECHNIQUE: Multiplanar, multiecho pulse sequences of the brain and surrounding structures were obtained without intravenous contrast. Angiographic images of the head were obtained using MRA technique without contrast. COMPARISON:  None. FINDINGS: MRI HEAD FINDINGS Diffusion imaging does not show any acute or subacute infarction. There are mild chronic small-vessel changes of the pons. There are a few old small vessel cerebellar infarctions. Cerebral hemispheres show generalized atrophy with mild chronic small-vessel disease of the white matter. There has been previous left parietal craniotomy and there is atrophy, encephalomalacia and gliosis of the left parietal lobe at the vertex. There is some hemosiderin deposition in that region. No evidence of neoplastic mass lesion, recent hemorrhage, hydrocephalus or extra-axial collection. No pituitary mass. There are inflammatory changes of the paranasal sinuses. Major vessels at the base of the brain show flow. MRA HEAD FINDINGS Both internal carotid arteries are widely patent. The anterior and middle cerebral vessels are normal without proximal stenosis, aneurysm or vascular malformation. Both vertebral arteries are patent with the right being dominant. Left vertebral artery terminates in PICA. Basilar artery is normal in caliber. Superior cerebellar and posterior cerebral arteries are patent. Large posterior communicating artery on the left. IMPRESSION: No acute intracranial finding. Previous left parietal craniotomy with atrophy, encephalomalacia and gliosis at the left parietal vertex. Mild chronic small-vessel change in age related atrophy. Intracranial MR angiography of the large and medium size vessels is normal. Electronically Signed   By: Nicolette Gieske Chimes M.D.   On:  06/02/2016 07:01   Mr Jodene Nam Head/brain [id number] Cm  Result Date: 06/02/2016 CLINICAL DATA:  Slurred speech.  History of prostate cancer. EXAM: MRI HEAD WITHOUT CONTRAST MRA HEAD WITHOUT CONTRAST TECHNIQUE: Multiplanar, multiecho pulse sequences of the brain and surrounding structures were obtained without intravenous contrast. Angiographic images of the head were obtained using MRA technique without contrast. COMPARISON:  None. FINDINGS: MRI HEAD FINDINGS Diffusion imaging does not show any acute or subacute infarction. There are mild chronic small-vessel changes of the pons. There are a few old small vessel cerebellar infarctions. Cerebral hemispheres show generalized atrophy with mild chronic small-vessel disease of the white matter. There has been previous left parietal craniotomy and there is atrophy, encephalomalacia and gliosis of the left parietal lobe at the vertex. There is some hemosiderin deposition in that region. No evidence of neoplastic mass lesion, recent hemorrhage, hydrocephalus or extra-axial collection. No pituitary mass. There are inflammatory changes of the paranasal sinuses. Major vessels at the base of the brain show flow. MRA HEAD FINDINGS Both internal carotid  arteries are widely patent. The anterior and middle cerebral vessels are normal without proximal stenosis, aneurysm or vascular malformation. Both vertebral arteries are patent with the right being dominant. Left vertebral artery terminates in PICA. Basilar artery is normal in caliber. Superior cerebellar and posterior cerebral arteries are patent. Large posterior communicating artery on the left. IMPRESSION: No acute intracranial finding. Previous left parietal craniotomy with atrophy, encephalomalacia and gliosis at the left parietal vertex. Mild chronic small-vessel change in age related atrophy. Intracranial MR angiography of the large and medium size vessels is normal. Electronically Signed   By: Shaunn Tackitt Chimes M.D.   On: 06/02/2016  07:01     Current Medications:  . aspirin EC  81 mg Oral Daily  . atorvastatin  10 mg Oral q1800  . clopidogrel  75 mg Oral Daily  . famotidine  20 mg Oral Daily  . guaiFENesin  1,200 mg Oral BID  . magnesium chloride  1 tablet Oral Daily  . PHENobarbital  64.8 mg Oral q morning - 10a  . PHENobarbital  97.2 mg Oral QPM  . phenytoin  200 mg Oral BID  . predniSONE  20 mg Oral Q breakfast      ASSESSMENT AND PLAN:  1.  Bradycardia - no sx here, it was only while asleep, HR not sustained < 50 - Holter results pending, monitor has been mailed back by [facility] - once monitor reviewed, if no sig bradycardia, then no further cardiac workup - sx may have been secondary to non-cardiac issues, once otherwise ready for d/c, ok from a cards standpoint, we can follow this up as an outpatient.  2. Recent ST elevation (STEMI) myocardial infarction involving other coronary artery of inferior wall (HCC) - ASA initially stopped due to hematuria, but resumed because of recent MI and thrombotic CFX. He really needs both ASA & Plavix if possible - on statin, no BB due to bradycardia  Otherwise, per IM, recheck CBC & BMET in am. Active Problems:   COLONIC POLYPS, ADENOMATOUS, HX OF   GI bleed   Slurred speech  Signed, Lenoard Aden 8:48 AM 06/03/2016    The patient was seen, examined and discussed with Rosaria Ferries, PA-C and I agree with the above.    82 y.o. year old male with a history of sz, prostate CA, HTN, HLD, GERD, s/p bladder adenocarcinoma resection on 04/19/16, s/p STEMI on 0000000 complicatedby severe bradycardia/Mobitz 2 second-degree heart block with heart rate noted in the upper 20s. DES RCA w/ 60% Lmain, 90% CFX w/ thrombus, EF 55%. TVP>>Mobitz II w/ HR 50-60 after 48 hr. 26 sec pause felt vagally mediated, EP saw, PPM not recommended. Hematuria has resolved. Recommeded Holter to decide if PPM needed. Reassess then and decide if recath or CABG indicated. Pt  admitted 09/13 for cough w/ clear sputum, weakness, H&H decreasing, heme + stools, ?slurred speech. Cards asked to see for Holter/HR, 48 hr Holter placed 09/07.   Per daughter the patient has been declining over the last 3 months, he has lost 35 lbs, he is weak, has no energy, no appetite.  We have obtained his Holter and are awaiting analysis. Telemetry in the hospital shows SR during the day and an intermittent 2:1 2. AVB with ventricular rate 36 BPM during sleep, the last night episodes of 1. AVB and Wenckebach episodes. No symptoms. He has no prior h/o syncope or presyncope.  Repeat echocardiogram today shows LVEF 60-65% with grade 1 DD and low filling pressures. He  has poor appetite and poor oral intake.  I would start iv hydration.   Overall status declining, I am suspicious about underlying malignancy, the patient followed up with urology , pathology showed adenocarcinoma. He also has positive FOBT, he needs work up for underlying GI bleed, ? Malignancy, consider CT scan of the chest/abdomen/pelvis.  If Holter monitor doesn't show any significant AVB we will sign off. His current condition doesn't seem to be related to his heart.   Ena Dawley, MD 06/03/2016

## 2016-06-03 NOTE — Care Management Obs Status (Signed)
[facility] NOTIFICATION   Patient Details  Name: Joshua [name] MRN: [id number] Date of Birth: Feb 19, 1932   Medicare Observation Status Notification Given:  Yes    McGibboneyOletta Darter, RN 06/03/2016, 11:05 AM

## 2016-06-03 NOTE — Evaluation (Signed)
SLP Cancellation Note  Patient Details Name: Joshua [name] MRN: [id number] DOB: [birth year]   Cancelled treatment:       Reason Eval/Treat Not Completed: SLP screened, no needs identified, will sign off (pt at SNF prior to admission, MRI negative, daughter Vaughan Basta reports pt with episodic dysarthria presumed to be related to lethargy, xerostomia, and bed positioning, no SLP needs identified at this time)   Macario Golds 06/03/2016, 12:16 PM

## 2016-06-03 NOTE — Clinical Social Work Note (Signed)
[facility] [place] prepared to readmitted patient over the weekend if medically stable for dc.   Carlyon Shadow will be facility contact for Saturday, 9/16. Judson Roch will be facility contact for Sunday, 9/17.   No further concerns reported at this time.   Glendon Axe, MSW [phone] 06/03/2016 12:01 PM

## 2016-06-03 NOTE — Progress Notes (Signed)
Initial Nutrition Assessment  DOCUMENTATION CODES:   Severe malnutrition in context of acute illness/injury  INTERVENTION:   Provide Ensure Enlive po BID, each supplement provides 350 kcal and 20 grams of protein Encourage PO intake RD to continue to monitor  NUTRITION DIAGNOSIS:   Malnutrition related to acute illness as evidenced by percent weight loss, moderate depletion of body fat, severe depletion of muscle mass.  GOAL:   Patient will meet greater than or equal to 90% of their needs  MONITOR:   PO intake, Supplement acceptance, Labs, Weight trends, I & O's  REASON FOR ASSESSMENT:   Consult Assessment of nutrition requirement/status  ASSESSMENT:   82 y.o.malewith past medical history significant for recent MI, status post stent placement. Patient did really well for the first week or so in skilled nursing facility but after that patient's daughter reported steady decline with progressive weight loss and less mobility. Patient also reported feeling very weak. His family noted slurred speech for which reason he was sent to ED for evaluation.  Patient in room with daughter at bedside. Pt currently denies poor appetite, chewing or swallowing issues. Pt ate 100% of his breakfast of french toast, fruit and sausage. Pt's PO intake has progressively improve since admission. Pt's daughter reports pt was not eating well at SNF PTA d/t not liking the food. Pt's daughter recalls pt handing her a pancake and it being ice cold. Pt is willing to try Ensure supplements to provide extra calories and protein during admission. RD to order. Pt reports UBW of 195-200 lb. Pt has lost 27 lb since 6/27 (14% wt loss x ~3 months, significant for time frame). Nutrition-Focused physical exam completed. Findings are moderate fat depletion, severe muscle depletion, and no edema.   Labs reviewed. Medications: SLOW-MAG tablet daily  Diet Order:  Diet Heart Room service appropriate? Yes; Fluid  consistency: Thin  Skin:  Reviewed, no issues  Last BM:  9/12  Height:   Ht Readings from Last 1 Encounters:  06/01/16 6\' 1"  (1.854 m)    Weight:   Wt Readings from Last 1 Encounters:  06/01/16 166 lb 10.7 oz (75.6 kg)    Ideal Body Weight:  83.6 kg  BMI:  Body mass index is 21.99 kg/m.  Estimated Nutritional Needs:   Kcal:  2000-2200  Protein:  90-100g  Fluid:  2L/day  EDUCATION NEEDS:   Education needs addressed  Clayton Bibles, MS, RD, LDN Pager: [phone] After Hours Pager: [phone]

## 2016-06-03 NOTE — Progress Notes (Signed)
Patient ID: Joshua [name], male   DOB: 05-10-1932, 82 y.o.   MRN: [id number]  PROGRESS NOTE    Joshua [name]  [id number] DOB: 05-Nov-1931 DOA: 06/01/2016  PCP: Redge Gainer, MD   Brief Narrative:  82 y.o. male with past medical history significant for recent MI, status post stent placement. Patient did really well for the first week or so in skilled nursing facility but after that patient's daughter reported steady decline with progressive weight loss and less mobility. Patient also reported feeling very weak. His family noted slurred speech for which reason he was sent to ED for evaluation.  Patient was hemodynamically stable in ED. MRI of the brain did not reveal acute stroke. His phenytoin level was 25 on the admission. Chest x-ray showed no acute infiltrates.  Assessment & Plan:   Active Problems: Generalized weakness - Unclear etiology - Physical therapy evaluation appreciated, patient will return to skilled nursing facility when stable - MRI without evidence of acute intracranial finding  Failure to thrive in adult / severe protein calorie malnutrition - Likely secondary from recent MI - Continue nutritional supplementation  History of recent MI status post stent placement - Continue aspirin and Plavix - No chest pain  History of seizures - Dilantin 200 mg twice daily. Dilantin level was 25 on the admission - Repeat Dilantin level 21.5  - Will obtain Dilantin level tomorrow morning  Dyslipidemia - Continue statin therapy  Post MI depression - Start low-dose Celexa   DVT prophylaxis: asa, plavix, SCD's bilaterally  Code Status: full code  Family Communication: daughter at the bedside this am Disposition Plan: To skilled nursing facility in a.m. if Dilantin level trending down   Consultants:   Physical therapy  Nutrition  Procedures:   None   Antimicrobials:   None     Subjective: No overnight events.  Objective: Vitals:   06/02/16 0413  06/02/16 1435 06/02/16 2135 06/03/16 0449  BP: 138/75 132/66 130/71 125/81  Pulse: 69 77 75 87  Resp: 18 18 18 18   Temp: 98.2 F (36.8 C) 98 F (36.7 C) 99 F (37.2 C) 98.7 F (37.1 C)  TempSrc: Oral Oral Oral Oral  SpO2: 99% 98% 100% 98%  Weight:      Height:        Intake/Output Summary (Last 24 hours) at 06/03/16 1216 Last data filed at 06/03/16 0946  Gross per 24 hour  Intake              360 ml  Output              750 ml  Net             -390 ml   Filed Weights   06/01/16 1332 06/01/16 1659  Weight: 80.3 kg (177 lb) 75.6 kg (166 lb 10.7 oz)    Examination:  General exam: Appears calm and comfortable, no distress  Respiratory system: Clear to auscultation. No wheezing  Cardiovascular system: S1 & S2 heard, Rate controlled  Gastrointestinal system: (+) BS, non tender  Central nervous system: No focal neurological deficits. Extremities: No edema, no tenderness  Skin: warm and dry  Psychiatry: Mood & affect appropriate.   Data Reviewed: I have personally reviewed following labs and imaging studies  CBC:  Recent Labs Lab 06/01/16 1400 06/02/16 0452  WBC 12.7* 12.4*  NEUTROABS 10.2*  --   HGB 9.4* 9.1*  HCT 29.0* 28.5*  MCV 93.9 94.1  PLT 331 [id number]   Basic Metabolic Panel:  Recent Labs Lab 06/01/16 1400 06/02/16 0452  NA 137 139  K 4.3 3.9  CL 104 106  CO2 27 27  GLUCOSE 129* 103*  BUN 21* 16  CREATININE 0.76 0.68  CALCIUM 8.6* 8.8*   GFR: Estimated Creatinine Clearance: 76.1 mL/min (by C-G formula based on SCr of 0.68 mg/dL). Liver Function Tests:  Recent Labs Lab 06/01/16 1400  AST 30  ALT 31  ALKPHOS 190*  BILITOT 0.6  PROT 6.9  ALBUMIN 3.1*   No results for input(s): LIPASE, AMYLASE in the last 168 hours. No results for input(s): AMMONIA in the last 168 hours. Coagulation Profile: No results for input(s): INR, PROTIME in the last 168 hours. Cardiac Enzymes:  Recent Labs Lab 06/01/16 1400  TROPONINI <0.03   BNP (last 3  results) No results for input(s): PROBNP in the last 8760 hours. HbA1C:  Recent Labs  06/02/16 0452  HGBA1C 5.0   CBG: No results for input(s): GLUCAP in the last 168 hours. Lipid Profile: No results for input(s): CHOL, HDL, LDLCALC, TRIG, CHOLHDL, LDLDIRECT in the last 72 hours. Thyroid Function Tests: No results for input(s): TSH, T4TOTAL, FREET4, T3FREE, THYROIDAB in the last 72 hours. Anemia Panel: No results for input(s): VITAMINB12, FOLATE, FERRITIN, TIBC, IRON, RETICCTPCT in the last 72 hours. Urine analysis:    Component Value Date/Time   COLORURINE YELLOW 06/01/2016 1347   APPEARANCEUR CLOUDY (A) 06/01/2016 1347   APPEARANCEUR Clear 01/20/2016 0959   LABSPEC 1.026 06/01/2016 1347   PHURINE 7.0 06/01/2016 1347   GLUCOSEU NEGATIVE 06/01/2016 1347   HGBUR NEGATIVE 06/01/2016 1347   BILIRUBINUR NEGATIVE 06/01/2016 1347   BILIRUBINUR Negative 01/20/2016 0959   KETONESUR NEGATIVE 06/01/2016 1347   PROTEINUR NEGATIVE 06/01/2016 1347   UROBILINOGEN negative 09/10/2014 1233   UROBILINOGEN 0.2 09/23/2009 2141   NITRITE NEGATIVE 06/01/2016 1347   LEUKOCYTESUR TRACE (A) 06/01/2016 1347   LEUKOCYTESUR Negative 01/20/2016 0959   Sepsis Labs: @LABRCNTIP (procalcitonin:4,lacticidven:4)   Recent Results (from the past 240 hour(s))  MRSA PCR Screening     Status: None   Collection Time: 06/01/16  6:30 PM  Result Value Ref Range Status   MRSA by PCR NEGATIVE NEGATIVE Final      Radiology Studies: Dg Chest 2 View Result Date: 06/01/2016 Enlargement of cardiac silhouette. COPD changes without acute infiltrate. Aortic atherosclerosis. Electronically Signed   By: Lavonia Dana M.D.   On: 06/01/2016 14:22   Mr Brain Wo Contrast Result Date: 06/02/2016 No acute intracranial finding. Previous left parietal craniotomy with atrophy, encephalomalacia and gliosis at the left parietal vertex. Mild chronic small-vessel change in age related atrophy. Intracranial MR angiography of the  large and medium size vessels is normal. Electronically Signed   By: Nelson Chimes M.D.   On: 06/02/2016 07:01   Mr Jodene Nam Head/brain [id number] Cm Result Date: 06/02/2016 No acute intracranial finding. Previous left parietal craniotomy with atrophy, encephalomalacia and gliosis at the left parietal vertex. Mild chronic small-vessel change in age related atrophy. Intracranial MR angiography of the large and medium size vessels is normal. Electronically Signed   By: Nelson Chimes M.D.   On: 06/02/2016 07:01     Scheduled Meds: . aspirin EC  81 mg Oral Daily  . atorvastatin  10 mg Oral q1800  . citalopram  10 mg Oral Daily  . clopidogrel  75 mg Oral Daily  . famotidine  20 mg Oral Daily  . feeding supplement (ENSURE ENLIVE)  237 mL Oral BID BM  . guaiFENesin  1,200 mg  Oral BID  . magnesium chloride  1 tablet Oral Daily  . PHENobarbital  64.8 mg Oral BID  . phenytoin  200 mg Oral BID  . predniSONE  20 mg Oral Q breakfast   Continuous Infusions:    LOS: 0 days    Time spent: 15 minutes  Greater than 50% of the time spent on counseling and coordinating the care.   Leisa Lenz, MD [facility] Pager [phone]  If 7PM-7AM, please contact night-coverage [email or web address] Password [email or web address] 06/03/2016, 12:16 PM

## 2016-06-04 DIAGNOSIS — Z86010 Personal history of colonic polyps: Secondary | ICD-10-CM | POA: Diagnosis not present

## 2016-06-04 DIAGNOSIS — Z9181 History of falling: Secondary | ICD-10-CM | POA: Diagnosis not present

## 2016-06-04 DIAGNOSIS — C672 Malignant neoplasm of lateral wall of bladder: Secondary | ICD-10-CM | POA: Diagnosis not present

## 2016-06-04 DIAGNOSIS — R1312 Dysphagia, oropharyngeal phase: Secondary | ICD-10-CM | POA: Diagnosis not present

## 2016-06-04 DIAGNOSIS — R569 Unspecified convulsions: Secondary | ICD-10-CM | POA: Diagnosis not present

## 2016-06-04 DIAGNOSIS — E43 Unspecified severe protein-calorie malnutrition: Secondary | ICD-10-CM | POA: Diagnosis not present

## 2016-06-04 DIAGNOSIS — Z23 Encounter for immunization: Secondary | ICD-10-CM | POA: Diagnosis not present

## 2016-06-04 DIAGNOSIS — I213 ST elevation (STEMI) myocardial infarction of unspecified site: Secondary | ICD-10-CM | POA: Diagnosis not present

## 2016-06-04 DIAGNOSIS — D692 Other nonthrombocytopenic purpura: Secondary | ICD-10-CM | POA: Diagnosis not present

## 2016-06-04 DIAGNOSIS — D649 Anemia, unspecified: Secondary | ICD-10-CM | POA: Diagnosis not present

## 2016-06-04 DIAGNOSIS — K5900 Constipation, unspecified: Secondary | ICD-10-CM | POA: Diagnosis not present

## 2016-06-04 DIAGNOSIS — R41841 Cognitive communication deficit: Secondary | ICD-10-CM | POA: Diagnosis not present

## 2016-06-04 DIAGNOSIS — Z7982 Long term (current) use of aspirin: Secondary | ICD-10-CM | POA: Diagnosis not present

## 2016-06-04 DIAGNOSIS — I2510 Atherosclerotic heart disease of native coronary artery without angina pectoris: Secondary | ICD-10-CM | POA: Diagnosis not present

## 2016-06-04 DIAGNOSIS — F321 Major depressive disorder, single episode, moderate: Secondary | ICD-10-CM | POA: Diagnosis not present

## 2016-06-04 DIAGNOSIS — I10 Essential (primary) hypertension: Secondary | ICD-10-CM | POA: Diagnosis not present

## 2016-06-04 DIAGNOSIS — E559 Vitamin D deficiency, unspecified: Secondary | ICD-10-CM | POA: Diagnosis not present

## 2016-06-04 DIAGNOSIS — R2681 Unsteadiness on feet: Secondary | ICD-10-CM | POA: Diagnosis not present

## 2016-06-04 DIAGNOSIS — E860 Dehydration: Secondary | ICD-10-CM | POA: Diagnosis not present

## 2016-06-04 DIAGNOSIS — R531 Weakness: Secondary | ICD-10-CM

## 2016-06-04 DIAGNOSIS — R627 Adult failure to thrive: Secondary | ICD-10-CM | POA: Diagnosis not present

## 2016-06-04 DIAGNOSIS — R001 Bradycardia, unspecified: Secondary | ICD-10-CM | POA: Diagnosis not present

## 2016-06-04 DIAGNOSIS — I2119 ST elevation (STEMI) myocardial infarction involving other coronary artery of inferior wall: Secondary | ICD-10-CM | POA: Diagnosis not present

## 2016-06-04 DIAGNOSIS — I110 Hypertensive heart disease with heart failure: Secondary | ICD-10-CM | POA: Diagnosis not present

## 2016-06-04 DIAGNOSIS — I252 Old myocardial infarction: Secondary | ICD-10-CM | POA: Diagnosis not present

## 2016-06-04 DIAGNOSIS — E7800 Pure hypercholesterolemia, unspecified: Secondary | ICD-10-CM | POA: Diagnosis not present

## 2016-06-04 DIAGNOSIS — R319 Hematuria, unspecified: Secondary | ICD-10-CM | POA: Diagnosis not present

## 2016-06-04 DIAGNOSIS — M6281 Muscle weakness (generalized): Secondary | ICD-10-CM | POA: Diagnosis not present

## 2016-06-04 DIAGNOSIS — R4781 Slurred speech: Secondary | ICD-10-CM | POA: Diagnosis not present

## 2016-06-04 DIAGNOSIS — C44612 Basal cell carcinoma of skin of right upper limb, including shoulder: Secondary | ICD-10-CM | POA: Diagnosis not present

## 2016-06-04 DIAGNOSIS — R278 Other lack of coordination: Secondary | ICD-10-CM | POA: Diagnosis not present

## 2016-06-04 DIAGNOSIS — J449 Chronic obstructive pulmonary disease, unspecified: Secondary | ICD-10-CM | POA: Diagnosis not present

## 2016-06-04 DIAGNOSIS — Z955 Presence of coronary angioplasty implant and graft: Secondary | ICD-10-CM | POA: Diagnosis not present

## 2016-06-04 DIAGNOSIS — R5383 Other fatigue: Secondary | ICD-10-CM | POA: Diagnosis not present

## 2016-06-04 DIAGNOSIS — E785 Hyperlipidemia, unspecified: Secondary | ICD-10-CM | POA: Diagnosis not present

## 2016-06-04 DIAGNOSIS — C679 Malignant neoplasm of bladder, unspecified: Secondary | ICD-10-CM | POA: Diagnosis not present

## 2016-06-04 DIAGNOSIS — K219 Gastro-esophageal reflux disease without esophagitis: Secondary | ICD-10-CM | POA: Diagnosis not present

## 2016-06-04 DIAGNOSIS — Z8551 Personal history of malignant neoplasm of bladder: Secondary | ICD-10-CM | POA: Diagnosis not present

## 2016-06-04 DIAGNOSIS — I441 Atrioventricular block, second degree: Secondary | ICD-10-CM | POA: Diagnosis not present

## 2016-06-04 DIAGNOSIS — G40909 Epilepsy, unspecified, not intractable, without status epilepticus: Secondary | ICD-10-CM | POA: Diagnosis not present

## 2016-06-04 LAB — BASIC METABOLIC PANEL
ANION GAP: 7 (ref 5–15)
BUN: 20 mg/dL (ref 6–20)
CALCIUM: 8.8 mg/dL — AB (ref 8.9–10.3)
CO2: 26 mmol/L (ref 22–32)
CREATININE: 0.7 mg/dL (ref 0.61–1.24)
Chloride: 105 mmol/L (ref 101–111)
GFR calc Af Amer: >60 mL/min (ref >60)
GFR calc non Af Amer: >60 mL/min (ref >60)
Glucose, Bld: 135 mg/dL — ABNORMAL HIGH (ref 65–99)
POTASSIUM: 3.7 mmol/L (ref 3.5–5.1)
Sodium: 138 mmol/L (ref 135–145)

## 2016-06-04 LAB — CBC
HCT: 29.1 % — ABNORMAL LOW (ref 39.0–52.0)
HEMOGLOBIN: 9.5 g/dL — AB (ref 13.0–17.0)
MCH: 30.3 pg (ref 26.0–34.0)
MCHC: 32.6 g/dL (ref 30.0–36.0)
MCV: 92.7 fL (ref 78.0–100.0)
PLATELETS: 421 10*3/uL — AB (ref 150–400)
RBC: 3.14 MIL/uL — AB (ref 4.22–5.81)
RDW: 16.3 % — ABNORMAL HIGH (ref 11.5–15.5)
WBC: 14.5 10*3/uL — AB (ref 4.0–10.5)

## 2016-06-04 LAB — PHENYTOIN LEVEL, TOTAL: PHENYTOIN LVL: 18.6 ug/mL (ref 10.0–20.0)

## 2016-06-04 MED ORDER — ENSURE ENLIVE PO LIQD: 237.0000 mL | Freq: Two times a day (BID) | ORAL | 12 refills | Status: DC

## 2016-06-04 MED ORDER — PHENOBARBITAL 64.8 MG PO TABS: 64.8000 mg | ORAL_TABLET | Freq: Two times a day (BID) | ORAL | 0 refills | Status: DC

## 2016-06-04 MED ORDER — PHENYTOIN SODIUM EXTENDED 200 MG PO CAPS: 200.0000 mg | ORAL_CAPSULE | Freq: Two times a day (BID) | ORAL | 0 refills | Status: DC

## 2016-06-04 MED ORDER — MAGNESIUM CHLORIDE 64 MG PO TBEC: 1.0000 | DELAYED_RELEASE_TABLET | Freq: Every day | ORAL | 0 refills | Status: DC

## 2016-06-04 MED ORDER — CITALOPRAM HYDROBROMIDE 10 MG PO TABS: 10.0000 mg | ORAL_TABLET | Freq: Every day | ORAL | 0 refills | Status: DC

## 2016-06-04 NOTE — Progress Notes (Signed)
Patient Name: Joshua [name] Date of Encounter: 06/04/2016  Active Problems:   COLONIC POLYPS, ADENOMATOUS, HX OF   ST elevation (STEMI) myocardial infarction involving other coronary artery of inferior wall (HCC)   GI bleed   Slurred speech   Bradycardia   General weakness   Protein-calorie malnutrition, severe (HCC)   Failure to thrive in adult   Weakness   Primary Cardiologist: Dr Tamala Julian Patient Profile: 83 y.o. year old male with a history of sz, prostate CA, bladder CA HTN, HLD, GERD. STEMI [id number] w/ DES RCA complicated by Mobitz II>HR improved and PPM not indicated. Op Holter results pending. Admitted 09/13, cards asked to see for arrhythmia  SUBJECTIVE: Does not feel like eating, but did so. Feels about the same as yesterday.  OBJECTIVE Vitals:   06/03/16 0449 06/03/16 1417 06/03/16 2012 06/04/16 0430  BP: 125/81 120/63 135/74 139/74  Pulse: 87 81 81 98  Resp: 18 16 16 16   Temp: 98.7 F (37.1 C) 97.8 F (36.6 C) 98 F (36.7 C) 98.8 F (37.1 C)  TempSrc: Oral Oral Oral Oral  SpO2: 98% 96% 96% 95%  Weight:      Height:        Intake/Output Summary (Last 24 hours) at 06/04/16 0935 Last data filed at 06/04/16 0431  Gross per 24 hour  Intake              720 ml  Output              525 ml  Net              195 ml   Filed Weights   06/01/16 1332 06/01/16 1659  Weight: 177 lb (80.3 kg) 166 lb 10.7 oz (75.6 kg)    PHYSICAL EXAM General: Well developed, well nourished, male in no acute distress. Head: Normocephalic, atraumatic.  Neck: Supple without bruits, JVD. Lungs:  Resp regular and unlabored, CTA. Heart: RRR, S1, S2, no S3, S4, or murmur; no rub. Abdomen: Soft, non-tender, non-distended, BS + x 4.  Extremities: No clubbing, cyanosis, edema.  Neuro: Alert and oriented X 3. Moves all extremities spontaneously. Psych: Normal affect.  LABS: CBC:  Recent Labs  06/01/16 1400 06/02/16 0452 06/04/16 0530  WBC 12.7* 12.4* 14.5*  NEUTROABS 10.2*   --   --   HGB 9.4* 9.1* 9.5*  HCT 29.0* 28.5* 29.1*  MCV 93.9 94.1 92.7  PLT 331 335 [id number]*   Basic Metabolic Panel:  Recent Labs  06/02/16 0452 06/04/16 0530  NA 139 138  K 3.9 3.7  CL 106 105  CO2 27 26  GLUCOSE 103* 135*  BUN 16 20  CREATININE 0.68 0.70  CALCIUM 8.8* 8.8*   Liver Function Tests:  Recent Labs  06/01/16 1400  AST 30  ALT 31  ALKPHOS 190*  BILITOT 0.6  PROT 6.9  ALBUMIN 3.1*   Cardiac Enzymes:  Recent Labs  06/01/16 1400  TROPONINI <0.03   BNP:  B Natriuretic Peptide  Date/Time Value Ref Range Status  06/01/2016 02:00 PM 107.1 (H) 0.0 - 100.0 pg/mL Final   Hemoglobin A1C:  Recent Labs  06/02/16 0452  HGBA1C 5.0   TELE:  SR, 1st deg AVB, Mobitz I AVB      ECHO: 09/14 - Left ventricle: The cavity size was normal. Wall thickness was   normal. Systolic function was normal. The estimated ejection   fraction was in the range of 60% to 65%. Although no diagnostic  regional wall motion abnormality was identified, this possibility   cannot be completely excluded on the basis of this study. There   was an increased relative contribution of atrial contraction to   ventricular filling, which may be due to hypovolemia. Left   ventricular diastolic function parameters were normal. Doppler   parameters are consistent with low ventricular filling pressure. - Mitral valve: Calcified annulus. - Pulmonary arteries: PA peak pressure: 34 mm Hg (S).  Radiology/Studies: No results found.   Current Medications:  . aspirin EC  81 mg Oral Daily  . atorvastatin  10 mg Oral q1800  . citalopram  10 mg Oral Daily  . clopidogrel  75 mg Oral Daily  . famotidine  20 mg Oral Daily  . feeding supplement (ENSURE ENLIVE)  237 mL Oral BID BM  . guaiFENesin  1,200 mg Oral BID  . magnesium chloride  1 tablet Oral Daily  . PHENobarbital  64.8 mg Oral BID  . phenytoin  200 mg Oral BID  . predniSONE  20 mg Oral Q breakfast      ASSESSMENT AND PLAN:  1.   Bradycardia - no sx here, it was only while asleep, HR not sustained < 50 - Holter results pending, monitor has been mailed back by [facility] - once monitor reviewed, if no sig bradycardia, then no further cardiac workup - sx may have been secondary to non-cardiac issues, once otherwise ready for d/c, ok from a cards standpoint, we can follow this up as an outpatient.  2. Recent ST elevation (STEMI) myocardial infarction involving other coronary artery of inferior wall (HCC) - ASA initially stopped due to hematuria, but resumed because of recent MI and thrombotic CFX. He really needs both ASA & Plavix if possible - on statin, no BB due to bradycardia  Otherwise, per IM, recheck CBC & BMET in am. Active Problems:   COLONIC POLYPS, ADENOMATOUS, HX OF   GI bleed   Slurred speech  82 y.o. year old male with a history of sz, prostate CA, HTN, HLD, GERD, s/p bladder adenocarcinoma resection on 04/19/16, s/p STEMI on 0000000 complicatedby severe bradycardia/Mobitz 2 second-degree heart block with heart rate noted in the upper 20s. DES RCA w/ 60% Lmain, 90% CFX w/ thrombus, EF 55%. TVP>>Mobitz II w/ HR 50-60 after 48 hr. 26 sec pause felt vagally mediated, EP saw, PPM not recommended. Hematuria has resolved. Recommeded Holter to decide if PPM needed. Reassess then and decide if recath or CABG indicated. Pt admitted 09/13 for cough w/ clear sputum, weakness, H&H decreasing, heme + stools, ?slurred speech. Cards asked to see for Holter/HR, 48 hr Holter placed 09/07.   Per daughter the patient has been declining over the last 3 months, he has lost 35 lbs, he is weak, has no energy, no appetite.  We have obtained his Holter and are awaiting analysis. Telemetry in the hospital shows SR during the day and an intermittent 2:1 2. AVB with ventricular rate 36 BPM during sleep, the last night episodes of 1. AVB and Wenckebach episodes. No symptoms. He has no prior h/o syncope or presyncope.  Repeat  echocardiogram today shows LVEF 60-65% with grade 1 DD and low filling pressures.   Overall status declining, I am suspicious about underlying malignancy, the patient followed up with urology , pathology showed adenocarcinoma. He also has positive FOBT, he needs work up for underlying GI bleed, ? Malignancy, consider CT scan of the chest/abdomen/pelvis.  His telemetry in 3 days showed no significant AVB,  only intermittent while asleep, his LVEF is normal with low volume suggestive of poor PO intake. His symptoms don't seem to be cardiac. We will sign off. Call us with any questions.    Ena Dawley, MD 06/04/2016

## 2016-06-04 NOTE — Clinical Social Work Placement (Signed)
   CLINICAL SOCIAL WORK PLACEMENT  NOTE  Date:  06/04/2016  Patient Details  Name: Joshua [name] MRN: [id number] Date of Birth: 24-Mar-1932  Clinical Social Work is seeking post-discharge placement for this patient at the [place] level of care (*CSW will initial, date and re-position this form in  chart as items are completed):  Yes   Patient/family provided with [place] Work Department's list of facilities offering this level of care within the geographic area requested by the patient (or if unable, by the patient's family).  Yes   Patient/family informed of their freedom to choose among providers that offer the needed level of care, that participate in Medicare, Medicaid or managed care program needed by the patient, have an available bed and are willing to accept the patient.  Yes   Patient/family informed of [facility]'s ownership interest in [facility] and [facility], as well as of the fact that they are under no obligation to receive care at these facilities.  PASRR submitted to EDS on       PASRR number received on       Existing PASRR number confirmed on       FL2 transmitted to all facilities in geographic area requested by pt/family on       FL2 transmitted to all facilities within larger geographic area on       Patient informed that his/her managed care company has contracts with or will negotiate with certain facilities, including the following:        Yes   Patient/family informed of bed offers received.  Patient [facility] bed at [facility]     Physician recommends and patient chooses bed at [facility]    Patient to be transferred to [facility] on 06/04/16.  Patient to be transferred to facility by [facility]     Patient family notified on 06/04/16 of transfer.  Name of family member notified:  [name] Palms     PHYSICIAN Please prepare priority discharge summary, including  medications     Additional Comment:    _______________________________________________ Lia Hopping, LCSW 06/04/2016, 1:28 PM

## 2016-06-04 NOTE — Progress Notes (Signed)
MEDICATION RELATED CONSULT NOTE - follow up  Pharmacy Consult for phenytoin Indication: seizure disorder  Allergies  Allergen Reactions  . Penicillins Rash    Has patient had a PCN reaction causing immediate rash, facial/tongue/throat swelling, SOB or lightheadedness with hypotension: Yes Has patient had a PCN reaction causing severe rash involving mucus membranes or skin necrosis: No Has patient had a PCN reaction that required hospitalization No Has patient had a PCN reaction occurring within the last 10 years: No If all of the above answers are "NO", then may proceed with Cephalosporin use.     Patient Measurements: Height: 6\' 1"  (185.4 cm) Weight: 166 lb 10.7 oz (75.6 kg) IBW/kg (Calculated) : 79.9  Vital Signs: Temp: 98.8 F (37.1 C) (09/16 0430) Temp Source: Oral (09/16 0430) BP: 139/74 (09/16 0430) Pulse Rate: 98 (09/16 0430) Intake/Output from previous day: 09/15 0701 - 09/16 0700 In: 720 [P.O.:720] Out: 725 [Urine:725] Intake/Output from this shift: Total I/O In: 240 [P.O.:240] Out: -   Labs:  Recent Labs  06/01/16 1400 06/02/16 0452 06/04/16 0530  WBC 12.7* 12.4* 14.5*  HGB 9.4* 9.1* 9.5*  HCT 29.0* 28.5* 29.1*  PLT 331 335 421*  CREATININE 0.76 0.68 0.70  ALBUMIN 3.1*  --   --   PROT 6.9  --   --   AST 30  --   --   ALT 31  --   --   ALKPHOS 190*  --   --   BILITOT 0.6  --   --    Estimated Creatinine Clearance: 76.1 mL/min (by C-G formula based on SCr of 0.7 mg/dL).   Microbiology: Recent Results (from the past 720 hour(s))  MRSA PCR Screening     Status: None   Collection Time: 06/01/16  6:30 PM  Result Value Ref Range Status   MRSA by PCR NEGATIVE NEGATIVE Final    Comment:        The GeneXpert MRSA Assay (FDA approved for NASAL specimens only), is one component of a comprehensive MRSA colonization surveillance program. It is not intended to diagnose MRSA infection nor to guide or monitor treatment for MRSA infections.     Medications:  Prescriptions Prior to Admission  Medication Sig Dispense Refill Last Dose  . aspirin EC 81 MG tablet Take 1 tablet (81 mg total) by mouth daily. 90 tablet 3 06/01/2016 at Unknown time  . atorvastatin (LIPITOR) 80 MG tablet Take 1 tablet (80 mg total) by mouth daily at 6 PM. 30 tablet 12 05/31/2016 at Unknown time  . azithromycin (ZITHROMAX) 250 MG tablet Take 250-500 mg by mouth as directed. Take 500mg  on day 1, then 250mg  qd for 4 days..   05/31/2016 at Unknown time  . clopidogrel (PLAVIX) 75 MG tablet Take 1 tablet (75 mg total) by mouth daily. 30 tablet 12 06/01/2016 at 0740  . ergocalciferol (VITAMIN D2) 50000 units capsule Take 50,000 Units by mouth once a week.   06/01/2016 at Unknown time  . guaiFENesin (MUCINEX) 600 MG 12 hr tablet Take 1,200 mg by mouth 2 (two) times daily.   06/01/2016 at Unknown time  . HYDROcodone-acetaminophen (NORCO) 5-325 MG tablet Take 1 tablet by mouth every 6 (six) hours as needed for moderate pain. 10 tablet 0 unknown  . magnesium hydroxide (MILK OF MAGNESIA) 400 MG/5ML suspension Take 30 mLs by mouth daily as needed for mild constipation. 360 mL 0 05/31/2016 at Unknown time  . Multiple Vitamins-Minerals (ICAPS AREDS FORMULA PO) Take 1-2 capsules by mouth 2 (two)  times daily. Take 1 capsule in the morning, and 2 capsules in evening   06/01/2016 at Unknown time  . PHENobarbital (LUMINAL) 64.8 MG tablet Take 1 tablet (64.8 mg total) by mouth every morning. 30 tablet 4 06/01/2016 at Unknown time  . PHENobarbital (LUMINAL) 97.2 MG tablet Take 1 tablet (97.2 mg total) by mouth every evening. 30 tablet 4 05/31/2016 at Unknown time  . phenytoin (DILANTIN) 100 MG ER capsule Take 200-300 mg by mouth 2 (two) times daily. Take 200mg  by mouth each morning. Take 300mg  by mouth each evening.   06/01/2016 at Unknown time  . predniSONE (DELTASONE) 20 MG tablet Take 20 mg by mouth daily with breakfast.   06/01/2016 at Unknown time  . ranitidine (ZANTAC) 300 MG tablet Take  300 mg by mouth daily.   06/01/2016 at Unknown time   Scheduled:  . aspirin EC  81 mg Oral Daily  . atorvastatin  10 mg Oral q1800  . citalopram  10 mg Oral Daily  . clopidogrel  75 mg Oral Daily  . famotidine  20 mg Oral Daily  . feeding supplement (ENSURE ENLIVE)  237 mL Oral BID BM  . guaiFENesin  1,200 mg Oral BID  . magnesium chloride  1 tablet Oral Daily  . PHENobarbital  64.8 mg Oral BID  . phenytoin  200 mg Oral BID  . predniSONE  20 mg Oral Q breakfast    Assessment: 82yo w/ recent MI. Admitted 9/13 from SNF w/ weakness and slurred speech. Phenytoin level elevated. Phenobarb level in process. Pharmacy is asked to manage phenytoin.   Home dose phenytoin 200mg  in am and 300mg  in pm  9/13 Pht level = 25.1, albumin 3.1,  corrected phenytoin ~34.9. >> Dose reduced to 200 mg BID 9/14 Pht level= 21.5, corrected phenytoin 29.8 9/15 PHT level = 20.2; corrects to 28.1 9/16 PHT level 18.6; corrects to 25.8  Goal of Therapy:  Phenytoin level: 10-93mcg/ml   Plan:   Continue phenytoin 200 mg ER capsule BID as ordered. Not at steady state, but MD checking levels to ensure trending down before discharge  Recommend discharge on above dose. Note phenobarbital dose also changed based on levels; data for therapeutic drug monitoring not as clear with PB as with PHT, but suggest checking levels for both in 1 week (goal phenobarb level 10-30).  Reuel Boom, PharmD, BCPS Pager: [phone] 06/04/2016, 10:36 AM

## 2016-06-04 NOTE — Progress Notes (Signed)
Discharge Summary Faxed through Epic.  Patient Family report they will transport patient.

## 2016-06-04 NOTE — Progress Notes (Signed)
Pt discharged to [name]. Pt is being transported to facility by son. Left unit in wheelchair pushed by Nurse tech. Pt transferred to car by nurse tech and this Probation officer. Pt was very unsteady and had difficulties ambulating. Dr. Charlies Silvers paged for prescriptions for new meds (celexa, magnesium and ensure)  for pt to take to facility; but per MD pt does not need prescriptions if he was being discharged to SNF. Per AVS these meds were to be delivered by [employer] to pt's primary residence off [address] Prescription for phenobarbital placed in chart. MD said prescription did not require a signature. Report called and given to Deanna, LPN at facility. All nurse's questions answered to her satisfaction. Pt left in good/stable condition. VWilliams,rn.

## 2016-06-04 NOTE — Discharge Instructions (Signed)

## 2016-06-04 NOTE — Discharge Summary (Signed)
Physician Discharge Summary  MUKTAR BLOMBERG [id number] DOB: October 20, 1931 DOA: 06/01/2016  PCP: Redge Gainer, MD  Admit date: 06/01/2016 Discharge date: 06/04/2016  Recommendations for Outpatient Follow-up:  1. Continue dilantin 200 mg BID on discharge  2. Recheck dilantin level in next 1-2 weeks to make sure it is within normal limits   Discharge Diagnoses:  Active Problems:   COLONIC POLYPS, ADENOMATOUS, HX OF   ST elevation (STEMI) myocardial infarction involving other coronary artery of inferior wall (HCC)   GI bleed   Slurred speech   Bradycardia   General weakness   Protein-calorie malnutrition, severe (HCC)   Failure to thrive in adult   Weakness    Discharge Condition: stable   Diet recommendation: as tolerated   History of present illness:  82 y.o.malewith past medical history significant for recent MI, status post stent placement. Patient did really well for the first week or so in skilled nursing facility but after that patient's daughter reported steady decline with progressive weight loss and less mobility. Patient also reported feeling very weak. His family noted slurred speech for which reason he was sent to ED for evaluation.  Patient was hemodynamically stable in ED. MRI of the brain did not reveal acute stroke. His phenytoin level was 25 on the admission. Chest x-ray showed no acute infiltrates.  Hospital Course:    Assessment & Plan:   Active Problems: Generalized weakness - Unclear etiology - Physical therapy evaluation appreciated, patient will return to skilled nursing facility when stable - MRI without evidence of acute intracranial finding  Failure to thrive in adult / severe protein calorie malnutrition - Likely secondary from recent MI - Continue nutritional supplementation  History of recent MI status post stent placement - Continue aspirin and Plavix - No chest pain - No further work up per cardio  History of seizures - Dilantin  200 mg twice daily. Dilantin level was 25 on the admission - Repeat Dilantin level WNL  Dyslipidemia - Continue statin therapy  Post MI depression - Started celexa   DVT prophylaxis: asa, plavix, SCD's bilaterally  Code Status: full code  Family Communication: son at the bedside this am    Consultants:   Physical therapy  Nutrition  Procedures:   None   Antimicrobials:   None      Signed:  Leisa Lenz, MD  [facility] 06/04/2016, 1:57 PM  Pager #: [phone]  Time spent in minutes: less than 30 minutes    Discharge Exam: Vitals:   06/04/16 1147 06/04/16 1315  BP: 131/66 140/68  Pulse: 80 80  Resp:  18  Temp: 98.2 F (36.8 C) 98.5 F (36.9 C)   Vitals:   06/03/16 2012 06/04/16 0430 06/04/16 1147 06/04/16 1315  BP: 135/74 139/74 131/66 140/68  Pulse: 81 98 80 80  Resp: 16 16  18   Temp: 98 F (36.7 C) 98.8 F (37.1 C) 98.2 F (36.8 C) 98.5 F (36.9 C)  TempSrc: Oral Oral Oral Oral  SpO2: 96% 95% 98% 96%  Weight:      Height:        General: Pt is alert, follows commands appropriately, not in acute distress Cardiovascular: Regular rate and rhythm, S1/S2 + Respiratory: Clear to auscultation bilaterally, no wheezing, no crackles, no rhonchi Abdominal: Soft, non tender, non distended, bowel sounds +, no guarding Extremities: no edema, no cyanosis, pulses palpable bilaterally DP and PT Neuro: Grossly nonfocal  Discharge Instructions  Discharge Instructions    Call MD for:  difficulty breathing,  headache or visual disturbances    Complete by:  As directed    Call MD for:  persistant nausea and vomiting    Complete by:  As directed    Call MD for:  redness, tenderness, or signs of infection (pain, swelling, redness, odor or green/yellow discharge around incision site)    Complete by:  As directed    Call MD for:  severe uncontrolled pain    Complete by:  As directed    Diet - low sodium heart healthy    Complete by:  As  directed    Increase activity slowly    Complete by:  As directed        Medication List    STOP taking these medications   azithromycin 250 MG tablet Commonly known as:  ZITHROMAX   HYDROcodone-acetaminophen 5-325 MG tablet Commonly known as:  NORCO     TAKE these medications   aspirin EC 81 MG tablet Take 1 tablet (81 mg total) by mouth daily.   atorvastatin 80 MG tablet Commonly known as:  LIPITOR Take 1 tablet (80 mg total) by mouth daily at 6 PM.   citalopram 10 MG tablet Commonly known as:  CELEXA Take 1 tablet (10 mg total) by mouth daily. Start taking on:  06/05/2016   clopidogrel 75 MG tablet Commonly known as:  PLAVIX Take 1 tablet (75 mg total) by mouth daily.   ergocalciferol 50000 units capsule Commonly known as:  VITAMIN D2 Take 50,000 Units by mouth once a week.   feeding supplement (ENSURE ENLIVE) Liqd Take 237 mLs by mouth 2 (two) times daily between meals.   guaiFENesin 600 MG 12 hr tablet Commonly known as:  MUCINEX Take 1,200 mg by mouth 2 (two) times daily.   ICAPS AREDS FORMULA PO Take 1-2 capsules by mouth 2 (two) times daily. Take 1 capsule in the morning, and 2 capsules in evening   magnesium chloride 64 MG Tbec SR tablet Commonly known as:  SLOW-MAG Take 1 tablet (64 mg total) by mouth daily. Start taking on:  06/05/2016   magnesium hydroxide 400 MG/5ML suspension Commonly known as:  MILK OF MAGNESIA Take 30 mLs by mouth daily as needed for mild constipation.   PHENobarbital 64.8 MG tablet Commonly known as:  LUMINAL Take 1 tablet (64.8 mg total) by mouth 2 (two) times daily. What changed:  when to take this  Another medication with the same name was removed. Continue taking this medication, and follow the directions you see here.   phenytoin 200 MG ER capsule Commonly known as:  DILANTIN Take 1 capsule (200 mg total) by mouth 2 (two) times daily. What changed:  medication strength  how much to take  additional  instructions   predniSONE 20 MG tablet Commonly known as:  DELTASONE Take 20 mg by mouth daily with breakfast.   ranitidine 300 MG tablet Commonly known as:  ZANTAC Take 300 mg by mouth daily.      Follow-up Information    Redge Gainer, MD. Schedule an appointment as soon as possible for a visit in 2 week(s).   Specialty:  Family Medicine Contact information: [place] [address] [phone]            The results of significant diagnostics from this hospitalization (including imaging, microbiology, ancillary and laboratory) are listed below for reference.    Significant Diagnostic Studies: Dg Chest 2 View  Result Date: 06/01/2016 CLINICAL DATA:  Cough, weakness, history prostate cancer, hypertension, coronary  artery disease post stenting and coronary PTCA EXAM: CHEST  2 VIEW COMPARISON:  05/02/2016 FINDINGS: Minimal enlargement of cardiac silhouette. Atherosclerotic calcification aorta. Mediastinal contours and pulmonary vascularity normal. Emphysematous changes with mild central peribronchial thickening. No acute infiltrate, pleural effusion or pneumothorax. Bones demineralized. IMPRESSION: Enlargement of cardiac silhouette. COPD changes without acute infiltrate. Aortic atherosclerosis. Electronically Signed   By: Lavonia Dana M.D.   On: 06/01/2016 14:22   Mr Brain Wo Contrast  Result Date: 06/02/2016 CLINICAL DATA:  Slurred speech.  History of prostate cancer. EXAM: MRI HEAD WITHOUT CONTRAST MRA HEAD WITHOUT CONTRAST TECHNIQUE: Multiplanar, multiecho pulse sequences of the brain and surrounding structures were obtained without intravenous contrast. Angiographic images of the head were obtained using MRA technique without contrast. COMPARISON:  None. FINDINGS: MRI HEAD FINDINGS Diffusion imaging does not show any acute or subacute infarction. There are mild chronic small-vessel changes of the pons. There are a few old small vessel cerebellar infarctions.  Cerebral hemispheres show generalized atrophy with mild chronic small-vessel disease of the white matter. There has been previous left parietal craniotomy and there is atrophy, encephalomalacia and gliosis of the left parietal lobe at the vertex. There is some hemosiderin deposition in that region. No evidence of neoplastic mass lesion, recent hemorrhage, hydrocephalus or extra-axial collection. No pituitary mass. There are inflammatory changes of the paranasal sinuses. Major vessels at the base of the brain show flow. MRA HEAD FINDINGS Both internal carotid arteries are widely patent. The anterior and middle cerebral vessels are normal without proximal stenosis, aneurysm or vascular malformation. Both vertebral arteries are patent with the right being dominant. Left vertebral artery terminates in PICA. Basilar artery is normal in caliber. Superior cerebellar and posterior cerebral arteries are patent. Large posterior communicating artery on the left. IMPRESSION: No acute intracranial finding. Previous left parietal craniotomy with atrophy, encephalomalacia and gliosis at the left parietal vertex. Mild chronic small-vessel change in age related atrophy. Intracranial MR angiography of the large and medium size vessels is normal. Electronically Signed   By: Nelson Chimes M.D.   On: 06/02/2016 07:01   Mr Jodene Nam Head/brain [id number] Cm  Result Date: 06/02/2016 CLINICAL DATA:  Slurred speech.  History of prostate cancer. EXAM: MRI HEAD WITHOUT CONTRAST MRA HEAD WITHOUT CONTRAST TECHNIQUE: Multiplanar, multiecho pulse sequences of the brain and surrounding structures were obtained without intravenous contrast. Angiographic images of the head were obtained using MRA technique without contrast. COMPARISON:  None. FINDINGS: MRI HEAD FINDINGS Diffusion imaging does not show any acute or subacute infarction. There are mild chronic small-vessel changes of the pons. There are a few old small vessel cerebellar infarctions. Cerebral  hemispheres show generalized atrophy with mild chronic small-vessel disease of the white matter. There has been previous left parietal craniotomy and there is atrophy, encephalomalacia and gliosis of the left parietal lobe at the vertex. There is some hemosiderin deposition in that region. No evidence of neoplastic mass lesion, recent hemorrhage, hydrocephalus or extra-axial collection. No pituitary mass. There are inflammatory changes of the paranasal sinuses. Major vessels at the base of the brain show flow. MRA HEAD FINDINGS Both internal carotid arteries are widely patent. The anterior and middle cerebral vessels are normal without proximal stenosis, aneurysm or vascular malformation. Both vertebral arteries are patent with the right being dominant. Left vertebral artery terminates in PICA. Basilar artery is normal in caliber. Superior cerebellar and posterior cerebral arteries are patent. Large posterior communicating artery on the left. IMPRESSION: No acute intracranial finding. Previous left parietal craniotomy with  atrophy, encephalomalacia and gliosis at the left parietal vertex. Mild chronic small-vessel change in age related atrophy. Intracranial MR angiography of the large and medium size vessels is normal. Electronically Signed   By: Nelson Chimes M.D.   On: 06/02/2016 07:01    Microbiology: Recent Results (from the past 240 hour(s))  MRSA PCR Screening     Status: None   Collection Time: 06/01/16  6:30 PM  Result Value Ref Range Status   MRSA by PCR NEGATIVE NEGATIVE Final    Comment:        The GeneXpert MRSA Assay (FDA approved for NASAL specimens only), is one component of a comprehensive MRSA colonization surveillance program. It is not intended to diagnose MRSA infection nor to guide or monitor treatment for MRSA infections.      Labs: Basic Metabolic Panel:  Recent Labs Lab 06/01/16 1400 06/02/16 0452 06/04/16 0530  NA 137 139 138  K 4.3 3.9 3.7  CL 104 106 105   CO2 27 27 26   GLUCOSE 129* 103* 135*  BUN 21* 16 20  CREATININE 0.76 0.68 0.70  CALCIUM 8.6* 8.8* 8.8*   Liver Function Tests:  Recent Labs Lab 06/01/16 1400  AST 30  ALT 31  ALKPHOS 190*  BILITOT 0.6  PROT 6.9  ALBUMIN 3.1*   No results for input(s): LIPASE, AMYLASE in the last 168 hours. No results for input(s): AMMONIA in the last 168 hours. CBC:  Recent Labs Lab 06/01/16 1400 06/02/16 0452 06/04/16 0530  WBC 12.7* 12.4* 14.5*  NEUTROABS 10.2*  --   --   HGB 9.4* 9.1* 9.5*  HCT 29.0* 28.5* 29.1*  MCV 93.9 94.1 92.7  PLT 331 335 421*   Cardiac Enzymes:  Recent Labs Lab 06/01/16 1400  TROPONINI <0.03   BNP: BNP (last 3 results)  Recent Labs  06/01/16 1400  BNP 107.1*    ProBNP (last 3 results) No results for input(s): PROBNP in the last 8760 hours.  CBG: No results for input(s): GLUCAP in the last 168 hours.

## 2016-06-06 DIAGNOSIS — F321 Major depressive disorder, single episode, moderate: Secondary | ICD-10-CM | POA: Diagnosis not present

## 2016-06-06 DIAGNOSIS — Z955 Presence of coronary angioplasty implant and graft: Secondary | ICD-10-CM | POA: Diagnosis not present

## 2016-06-06 DIAGNOSIS — C672 Malignant neoplasm of lateral wall of bladder: Secondary | ICD-10-CM | POA: Diagnosis not present

## 2016-06-06 DIAGNOSIS — I213 ST elevation (STEMI) myocardial infarction of unspecified site: Secondary | ICD-10-CM | POA: Diagnosis not present

## 2016-06-06 DIAGNOSIS — E43 Unspecified severe protein-calorie malnutrition: Secondary | ICD-10-CM | POA: Diagnosis not present

## 2016-06-08 NOTE — Progress Notes (Signed)
Cardiology Office Note    Date:  06/10/2016   ID:  Joshua [name], DOB 02-26-1932, MRN [id number]  PCP:  Redge Gainer, MD  Cardiologist:  Dr. Tamala Julian  Chief Complaint: Hospital follow up   History of Present Illness:   Joshua [name] is a 82 y.o. male with a history of sz, prostate CA, bladder CA HTN, HLD, GERD, recent STEMI 05/10/2016 w/ DES to RCA complicated by Mobitz II-->HR improved and PPM not indicated. Op Holter results pending who again admitted 9/13-9/16 for generalized weakness who presented for follow up.   The Recent was discharged 05/10/16 after STEMI, compicated by Mobitz 2 with HR 29 and hyotensive.  Now s/p DES to p&mRCA.  Continues with residual CAD Segmental 60% distal left main and 50% ostial involvement.  90% ostial to proximal circumflex containing thrombus.  Otherwise widely patent circumflex and LAD vessels.  EF 50% with low LVEDP.  Had T pacer for Mobitz II with improved heart rate 250 to 60s after 46 hours. 26 sec pause felt vagally mediated, EP saw, PPM not recommended.   Seen in office 08/28, ASA stopped for hematuria>>resumed. SR w/ 1st deg AVB. 48 hours Holter placed --> decide if PPM needed. Reassess then and decide if recath or CABG indicated.  Pt admitted 09/13 for cough w/ clear sputum, weakness, H&H decreasing, heme + stools. Seen by cardiology for arrhythmia. Telemetry in the hospital shows SR during the day and an intermittent 2:1 2. AVB with ventricular rate of 30s during sleep and Wenckebach episodes. Repeat echocardiogram 06/02/16 shows LVEF 60-65% with grade 1 DD and low filling pressures. MRI of the brain shows no acute finding. Dr. [name] suspected underlying malignancy, the patient followed up with urology ,pathology showed adenocarcinoma. He also has positive FOBT, he needs work up for underlying GI bleed per internal medicine team. Questionable follow-up plan.  Patient today for follow-up. Continues to feel weak and tired. Here with son and staff  from nursing facility. Intermittent shakiness. Using lift for transfer. No chest pain, palpitation, SOB, LE edema, orthopnea, PND or syncope. Seen by urology and no recurrent hematuria. Hasn't follow up with PCP yet.   Past Medical History:  Diagnosis Date  . Allergy    pcn  . Arteriovenous malformation    ([place])  . Blood transfusion    right eye  no surgery as yet  . Bradycardia   . CAD (coronary artery disease)    a. 04/27/2016 PCI with DES to RCA with 50% ostial to 60% segmental mid to distal left main, and 90% thrombus filled ostial to proximal circumflex, with distal right coronary filled by collaterals from left-to-right  . Constipation   . ED (erectile dysfunction)   . GERD (gastroesophageal reflux disease)   . Hypercholesterolemia   . Hypertension   . Muscle weakness   . Prostate cancer ([place]) 11/02/11 bxs   Adenocarcinoma,Gleason=3+3=6 & 3+4=7,PSA+1.58 Volume=19cc  . Prostate cancer ([place])   . Seizures ([place])   . STEMI (ST elevation myocardial infarction) ([place])   . Vitamin D deficiency     Past Surgical History:  Procedure Laterality Date  . ABDOMINAL SURGERY    . APPENDECTOMY    . CARDIAC CATHETERIZATION N/A 04/27/2016   Procedure: Left Heart Cath and Coronary Angiography;  Surgeon: Belva Crome, MD;  Location: [facility];  Service: Cardiovascular;  Laterality: N/A;  . CARDIAC CATHETERIZATION N/A 04/27/2016   Procedure: Temporary Pacemaker;  Surgeon: Belva Crome, MD;  Location: [facility];  Service: Cardiovascular;  Laterality: N/A;  . CARDIAC CATHETERIZATION N/A 04/27/2016   Procedure: Coronary Stent Intervention;  Surgeon: Belva Crome, MD;  Location: [facility];  Service: Cardiovascular;  Laterality: N/A;  Proximal RCA Mid RCA  . CORONARY STENT PLACEMENT    . CRANIOTOMY  1980  . CYSTOSCOPY  01/19/2012   Procedure: CYSTOSCOPY;  Surgeon: Malka So, MD;  Location: [facility];  Service: Urology;;  no seeds found in bladder  .  CYSTOSCOPY W/ RETROGRADES Bilateral 04/19/2016   Procedure: CYSTOSCOPY WITH BILATERAL RETROGRADE PYELOGRAM TRANSURETHRAL RESECTION OF BLADDER TUMOR ;  Surgeon: [name], MD;  Location: [facility];  Service: Urology;  Laterality: Bilateral;  . RADIOACTIVE SEED IMPLANT  01/19/2012   Procedure: RADIOACTIVE SEED IMPLANT;  Surgeon: Malka So, MD;  Location: [facility];  Service: Urology;  Laterality: N/A;  46 seeds implanted    Current Medications: Prior to Admission medications   Medication Sig Start Date End Date Taking? Authorizing Provider  aspirin EC 81 MG tablet Take 1 tablet (81 mg total) by mouth daily. 05/16/16   Isaiah Serge, NP  atorvastatin (LIPITOR) 80 MG tablet Take 1 tablet (80 mg total) by mouth daily at 6 PM. 05/07/16   Arbutus Leas, NP  citalopram (CELEXA) 10 MG tablet Take 1 tablet (10 mg total) by mouth daily. 06/05/16   Robbie Lis, MD  clopidogrel (PLAVIX) 75 MG tablet Take 1 tablet (75 mg total) by mouth daily. 05/07/16   Arbutus Leas, NP  ergocalciferol (VITAMIN D2) 50000 units capsule Take 50,000 Units by mouth once a week.    Historical Provider, MD  feeding supplement, ENSURE ENLIVE, (ENSURE ENLIVE) LIQD Take 237 mLs by mouth 2 (two) times daily between meals. 06/04/16   Robbie Lis, MD  guaiFENesin (MUCINEX) 600 MG 12 hr tablet Take 1,200 mg by mouth 2 (two) times daily.    Historical Provider, MD  magnesium chloride (SLOW-MAG) 64 MG TBEC SR tablet Take 1 tablet (64 mg total) by mouth daily. 06/05/16   Robbie Lis, MD  magnesium hydroxide (MILK OF MAGNESIA) 400 MG/5ML suspension Take 30 mLs by mouth daily as needed for mild constipation. 05/07/16   Arbutus Leas, NP  Multiple Vitamins-Minerals (ICAPS AREDS FORMULA PO) Take 1-2 capsules by mouth 2 (two) times daily. Take 1 capsule in the morning, and 2 capsules in evening    Historical Provider, MD  PHENobarbital (LUMINAL) 64.8 MG tablet Take 1 tablet (64.8 mg total) by mouth 2 (two) times daily. 06/04/16   Robbie Lis, MD  phenytoin (DILANTIN) 200 MG ER capsule Take 1 capsule (200 mg total) by mouth 2 (two) times daily. 06/04/16   Robbie Lis, MD  predniSONE (DELTASONE) 20 MG tablet Take 20 mg by mouth daily with breakfast.    Historical Provider, MD  ranitidine (ZANTAC) 300 MG tablet Take 300 mg by mouth daily.    Historical Provider, MD    Allergies:   Penicillins   Social History   Social History  . Marital status: Widowed    Spouse name: N/A  . Number of children: 2  . Years of education: N/A   Occupational History  . Retired    Social History Main Topics  . Smoking status: Former Smoker    Packs/day: 2.00    Years: 15.00    Quit date: 01/15/1973  . Smokeless tobacco: Never Used  . Alcohol use Yes  Comment: occasionally  . Drug use: No     Comment: quit smoking 40 years ago  . Sexual activity: Not on file   Other Topics Concern  . Not on file   Social History Narrative   [facility] Majestic to [facility] after MI for rehab.     Family History:  The patient's family history includes Emphysema in his father and mother; Stroke in his mother.   ROS:   Please see the history of present illness.    ROS All other systems reviewed and are negative.   PHYSICAL EXAM:   VS:  BP 140/68   Pulse 84    GEN: Frail thin male in no acute distress who is sitting in wheel chair HEENT: normal  Neck: no JVD, carotid bruits, or masses Cardiac: RRR; no murmurs, rubs, or gallops,no edema  Respiratory:  clear to auscultation bilaterally with faint rales, normal work of breathing GI: soft, nontender, nondistended, + BS MS: no deformity or atrophy  Skin: warm and dry, no rash Neuro:  Alert and Oriented x 3,  Psych: euthymic mood, full affect  Wt Readings from Last 3 Encounters:  06/01/16 166 lb 10.7 oz (75.6 kg)  06/01/16 166 lb (75.3 kg)  06/01/16 166 lb (75.3 kg)      Studies/Labs Reviewed:   EKG:  EKG is ordered today.  The ekg ordered today demonstrates sinus rhythm at rate of 84  bpm, prolonged PR interval. Non specific T wave changes  Recent Labs: 06/01/2016: ALT 31; B Natriuretic Peptide 107.1 06/04/2016: BUN 20; Creatinine, Ser 0.70; Hemoglobin 9.5; Platelets 421; Potassium 3.7; Sodium 138   Lipid Panel    Component Value Date/Time   CHOL 144 04/27/2016 1133   CHOL 162 09/10/2014 1201   TRIG 80 04/27/2016 1133   TRIG 85 01/20/2016 0958   HDL 43 04/27/2016 1133   HDL 69 01/20/2016 0958   CHOLHDL 3.3 04/27/2016 1133   VLDL 16 04/27/2016 1133   LDLCALC 85 04/27/2016 1133   LDLCALC 78 09/10/2014 1201   LDLCALC 99 05/21/2014 1101    Additional studies/ records that were reviewed today include:   Echocardiogram: 06/02/16 LV EF: 60% -   65%  ------------------------------------------------------------------- Indications:      CHF - 428.0.  ------------------------------------------------------------------- History:   PMH:   Dyspnea.  PMH:   Myocardial infarction.  Risk factors:  Hypertension.  ------------------------------------------------------------------- Study Conclusions  - Left ventricle: The cavity size was normal. Wall thickness was   normal. Systolic function was normal. The estimated ejection   fraction was in the range of 60% to 65%. Although no diagnostic   regional wall motion abnormality was identified, this possibility   cannot be completely excluded on the basis of this study. There   was an increased relative contribution of atrial contraction to   ventricular filling, which may be due to hypovolemia. Left   ventricular diastolic function parameters were normal. Doppler   parameters are consistent with low ventricular filling pressure. - Mitral valve: Calcified annulus. - Pulmonary arteries: PA peak pressure: 34 mm Hg (S).  LHC: 04/27/2016     Ost Cx to Mid Cx lesion, 90 %stenosed.  Ost 1st Mrg to 1st Mrg lesion, 50 %stenosed.  A STENT RESOLUTE INTEG [id number] drug eluting stent was successfully placed, and does not  overlap previously placed stent.  Mid RCA to Dist RCA lesion, 100 %stenosed.  Post intervention, there is a 0% residual stenosis.  A STENT RESOLUTE INTEG [id number] drug eluting stent was successfully placed, and  does not overlap previously placed stent.  Prox RCA lesion, 80 %stenosed.  Post intervention, there is a 0% residual stenosis.  The left ventricular ejection fraction is 45-50% by visual estimate.  The left ventricular systolic function is normal.   Acute inferior ST elevation myocardial infarction, complicated by Mobitz 2 second degree heart block with heart rate of 29 bpm and hypotension.  Successful insertion of temporary pacemaker wire for symptomatic bradycardia.  Total occlusion of the mid RCA reduced to 0% after drug-eluting stent implantation using a Resolute Integrity 2.75 x 30 mm drug-eluting stent postdilated to 3.0.  Successful angioplasty of the proximal right coronary from 80% to 0% with TIMI grade 3 flow using a resolute Integrity 3.0 x 18 mm DES postdilated to 3.25 mm in diameter.  Segmental 60% distal left main and 50% ostial involvement.  90% ostial to proximal circumflex containing thrombus.  Otherwise widely patent circumflex and LAD vessels.  Inferior wall akinesis. EF 50% with low LVEDP.   RECOMMENDATION:  Continue temporary transvenous pacemaker until recovery of AV conduction.  Aggrastat 18 hours.  Aspirin and Brilinta.  Hopeful conservative management for 4-6 weeks then consider re-study to look at the circumflex coronary artery (may be required prior to discharge if recurrent anginal/ischemic symptoms) and make a decision at that time about the possibility of coronary bypass grafting for left main disease.     ASSESSMENT & PLAN:    1. Inf STEMI with stent to  RCA. On plavix and ASA but ASA was stopped recently for hematuria--> resumed. Continue Plavix, ASA and statin. No further hematuria. No chest pain.   2. CAD - Recent STEMI  s/p DES to proximal and mRCA. Residual CAD with segmental 60% distal left main and 50% ostial involvement.  90% ostial to proximal circumflex containing thrombus.  Echocardiogram 06/02/16 shows LVEF 60-65% with grade 1 DD and low filling pressures. No chest pain or dyspnea. EKG today without acute changes. Possibly need re-look after 4-6 weeks of conservative management.   3.  Mobitz 2 with symptomatic bradycardia.   - Pending final reading of holter monitor. Discussed with Dr. [name] how saw the patient during recent admission. She briefly looked over monitor result which did not showed any significant bradycardia. She did not think that patient's fatigue and weakness are cardiac. Suspected underlying malignancy. Advised to follow up with PCP for non cardiac work up and + stool guaiac in hospital. Recommended by Dr. [name] to f/u with Dr. Tamala Julian or APP while Dr. Tamala Julian is in office after non cardiac work up by PCP to reassess further given residual CAD and bradycardia.   4. Recent  Hematuria - Seen by urology. Felt likely due to urology procedure prior to recent STEMI. Now resolved.   5. Anemia - Hgb stable. F/u with PCP.   6.  Hyperlipidemia check lipids and hepatic in 6 weeks.  7.  Hx of seizures on dilantin.   Spent most of time coordinating care with physician and reviewing and discussing different plan and disease progression with patient and his son who presented during [place].    Medication Adjustments/Labs and Tests Ordered: Current medicines are reviewed at length with the patient today.  Concerns regarding medicines are outlined above.  Medication changes, Labs and Tests ordered today are listed in the Patient Instructions below. Patient Instructions  Medication Instructions:  Your physician recommends that you continue on your current medications as directed. Please refer to the Current Medication list given to you today.  Labwork: None ordered  Testing/Procedures: None  ordered  Follow-Up: Your physician recommends that you schedule a follow-up appointment in:    Any Other Special Instructions Will Be Listed Below (If Applicable).     If you need a refill on your cardiac medications before your next appointment, please call your pharmacy.      Jarrett Soho, [place]  06/10/2016 10:22 AM    [facility] [place], [place], [place]  [address] Phone: [phone]; Fax: [phone]

## 2016-06-10 ENCOUNTER — Ambulatory Visit (INDEPENDENT_AMBULATORY_CARE_PROVIDER_SITE_OTHER): Payer: Medicare Other | Admitting: Physician Assistant

## 2016-06-10 VITALS — BP 140/68 | HR 84

## 2016-06-10 DIAGNOSIS — I441 Atrioventricular block, second degree: Secondary | ICD-10-CM

## 2016-06-10 DIAGNOSIS — R5383 Other fatigue: Secondary | ICD-10-CM | POA: Diagnosis not present

## 2016-06-10 DIAGNOSIS — I2510 Atherosclerotic heart disease of native coronary artery without angina pectoris: Secondary | ICD-10-CM | POA: Diagnosis not present

## 2016-06-10 DIAGNOSIS — I2111 ST elevation (STEMI) myocardial infarction involving right coronary artery: Secondary | ICD-10-CM

## 2016-06-10 DIAGNOSIS — I10 Essential (primary) hypertension: Secondary | ICD-10-CM | POA: Diagnosis not present

## 2016-06-10 DIAGNOSIS — E785 Hyperlipidemia, unspecified: Secondary | ICD-10-CM

## 2016-06-10 NOTE — Patient Instructions (Addendum)
Medication Instructions:  Your physician recommends that you continue on your current medications as directed. Please refer to the Current Medication list given to you today.  Labwork: None ordered  Testing/Procedures: None ordered  Follow-Up: Your physician recommends that you schedule a follow-up appointment in: [place] AN APP ON A DAY DR. Tamala Julian IS IN THE OFFICE   Any Other Special Instructions Will Be Listed Below (If Applicable).  Follow-up with your PCP.   If you need a refill on your cardiac medications before your next appointment, please call your pharmacy.

## 2016-06-14 ENCOUNTER — Telehealth: Payer: Self-pay | Admitting: Family Medicine

## 2016-06-15 ENCOUNTER — Ambulatory Visit: Payer: Medicare Other | Admitting: Family Medicine

## 2016-07-06 NOTE — Progress Notes (Signed)
Cardiology Office Note   Date:  07/07/2016   ID:  Joshua [name], DOB 15-May-1932, MRN [id number]  PCP:  Joshua Gainer, MD  Cardiologist:  Dr. Tamala Julian    Chief Complaint  Patient presents with  . Hospitalization Follow-up      History of Present Illness: Joshua [name] is a 82 y.o. male who presents for follow up of event monitor and recent MI.   history of sz, prostate CA, bladder CAHTN, HLD, GERD, recent STEMI 05/10/2016 w/ DES to RCA complicated by Mobitz II-->HR improved and PPM not indicated. Op Holter results pending who again admitted 9/13-9/16 for generalized weakness who presented for follow up.   The Recent was discharged 05/10/16 after STEMI, compicated by Mobitz 2 with HR 29 and hyotensive.  Now s/p DES to p&mRCA. Continues with residual CAD Segmental 60% distal left main and 50% ostial involvement. 90% ostial to proximal circumflex containing thrombus.Otherwise widely patent circumflex and LAD vessels.EF 50% with low LVEDP.Had T pacer for Mobitz II with improved heart rate to 60s after 46 hours. 26 sec pause felt vagally mediated, EP saw, PPM not recommended.    Pt admitted 09/13 for cough w/ clear sputum, weakness, H&H decreasing, heme + stools. Seen by cardiology for arrhythmia. Telemetry in the hospital shows SR during the day and an intermittent 2:1 2. AVB with ventricular rate of 30s during sleep and Wenckebach episodes. Repeat echocardiogram 06/02/16 shows LVEF 60-65% with grade 1 DD and low filling pressures. MRI of the brain shows no acute finding. Dr. Meda [name] suspected underlying malignancy, the patient followed up with urology ,pathology showed adenocarcinoma. He also has positive FOBT, he needs work up for underlying GI bleed per internal medicine team.    Last seen 9/22 with weakness.   Holter 05/26/16 with HR 45-113 av. 80, occ Mobitz no excessive bradycardia.   Today pt continues at SNF for rehab.  He feels better, has improved appetite. He denies chest  pain or SOB.  No lightheadedness.  His dilantin level that was elevated on most recent hospitalization but now improved 16.    CXR minimal cardiomegaly, no acute process.  Labs BUN 22, Cr. 0.67 K+ 4.1, Hgb 9.6.  His son is with him today.  Plan is to return home after rehab.  Past Medical History:  Diagnosis Date  . Allergy    pcn  . Arteriovenous malformation    ([place])  . Blood transfusion    right eye  no surgery as yet  . Bradycardia   . CAD (coronary artery disease)    a. 04/27/2016 PCI with DES to RCA with 50% ostial to 60% segmental mid to distal left main, and 90% thrombus filled ostial to proximal circumflex, with distal right coronary filled by collaterals from left-to-right  . Constipation   . ED (erectile dysfunction)   . GERD (gastroesophageal reflux disease)   . Hypercholesterolemia   . Hypertension   . Muscle weakness   . Prostate cancer ([place]) 11/02/11 bxs   Adenocarcinoma,Gleason=3+3=6 & 3+4=7,PSA+1.58 Volume=19cc  . Prostate cancer ([place])   . Seizures ([place])   . STEMI (ST elevation myocardial infarction) ([place])   . Vitamin D deficiency     Past Surgical History:  Procedure Laterality Date  . ABDOMINAL SURGERY    . APPENDECTOMY    . CARDIAC CATHETERIZATION N/A 04/27/2016   Procedure: Left Heart Cath and Coronary Angiography;  Surgeon: Belva Crome, MD;  Location: [facility];  Service: Cardiovascular;  Laterality: N/A;  .  CARDIAC CATHETERIZATION N/A 04/27/2016   Procedure: Temporary Pacemaker;  Surgeon: Belva Crome, MD;  Location: [facility];  Service: Cardiovascular;  Laterality: N/A;  . CARDIAC CATHETERIZATION N/A 04/27/2016   Procedure: Coronary Stent Intervention;  Surgeon: Belva Crome, MD;  Location: [facility];  Service: Cardiovascular;  Laterality: N/A;  Proximal RCA Mid RCA  . CORONARY STENT PLACEMENT    . CRANIOTOMY  1980  . CYSTOSCOPY  01/19/2012   Procedure: CYSTOSCOPY;  Surgeon: Malka So, MD;  Location: [facility];   Service: Urology;;  no seeds found in bladder  . CYSTOSCOPY W/ RETROGRADES Bilateral 04/19/2016   Procedure: CYSTOSCOPY WITH BILATERAL RETROGRADE PYELOGRAM TRANSURETHRAL RESECTION OF BLADDER TUMOR ;  Surgeon: [name], MD;  Location: [facility];  Service: Urology;  Laterality: Bilateral;  . RADIOACTIVE SEED IMPLANT  01/19/2012   Procedure: RADIOACTIVE SEED IMPLANT;  Surgeon: Malka So, MD;  Location: [facility];  Service: Urology;  Laterality: N/A;  68 seeds implanted     Current Outpatient Prescriptions  Medication Sig Dispense Refill  . aspirin EC 81 MG tablet Take 1 tablet (81 mg total) by mouth daily. 90 tablet 3  . atorvastatin (LIPITOR) 20 MG tablet Take 20 mg by mouth daily.    . Cholecalciferol (VITAMIN D3) 1000 units CAPS Take 1 capsule by mouth daily.    . citalopram (CELEXA) 10 MG tablet Take 1 tablet (10 mg total) by mouth daily. 30 tablet 0  . clopidogrel (PLAVIX) 75 MG tablet Take 1 tablet (75 mg total) by mouth daily. 30 tablet 12  . ergocalciferol (VITAMIN D2) 50000 units capsule Take 50,000 Units by mouth once a week.    . feeding supplement, ENSURE ENLIVE, (ENSURE ENLIVE) LIQD Take 237 mLs by mouth 2 (two) times daily between meals. 237 mL 12  . magnesium chloride (SLOW-MAG) 64 MG TBEC SR tablet Take 1 tablet (64 mg total) by mouth daily. 30 tablet 0  . magnesium hydroxide (MILK OF MAGNESIA) 400 MG/5ML suspension Take 30 mLs by mouth daily as needed for mild constipation. 360 mL 0  . PHENobarbital (LUMINAL) 64.8 MG tablet Take 1 tablet (64.8 mg total) by mouth 2 (two) times daily. 60 tablet 0  . phenytoin (DILANTIN) 200 MG ER capsule Take 1 capsule (200 mg total) by mouth 2 (two) times daily. 60 capsule 0  . polyethylene glycol (MIRALAX / GLYCOLAX) packet Take 17 g by mouth daily as needed for mild constipation or moderate constipation.    . predniSONE (DELTASONE) 20 MG tablet Take 20 mg by mouth daily with breakfast.    . ranitidine (ZANTAC) 300 MG tablet Take  300 mg by mouth daily.     No current facility-administered medications for this visit.     Allergies:   Penicillins    Social History:  The patient  reports that he quit smoking about 43 years ago. He has a 30.00 pack-year smoking history. He has never used smokeless tobacco. He reports that he drinks alcohol. He reports that he does not use drugs.   Family History:  The patient's family history includes Emphysema in his father and mother; Stroke in his mother.    ROS:  General:no colds or fevers, no weight changes Skin:no rashes or ulcers HEENT:no blurred vision, no congestion CV:see HPI PUL:see HPI GI:no diarrhea constipation or melena, no indigestion GU:no hematuria, no dysuria MS:no joint pain, no claudication Neuro:no syncope, no lightheadedness Endo:no diabetes, no thyroid disease  Wt Readings  from Last 3 Encounters:  07/07/16 171 lb (77.6 kg)  06/01/16 166 lb 10.7 oz (75.6 kg)  06/01/16 166 lb (75.3 kg)     PHYSICAL EXAM: VS:  BP (!) 120/50   Pulse 67   Ht 6\' 1"  (1.854 m)   Wt 171 lb (77.6 kg)   SpO2 95%   BMI 22.56 kg/m  , BMI Body mass index is 22.56 kg/m. General:Pleasant affect, NAD Skin:Warm and dry, brisk capillary refill HEENT:normocephalic, sclera clear, mucus membranes moist Neck:supple, no JVD, no bruits  Heart:S1S2 RRR without murmur, gallup, rub or click Lungs:clear without rales, rhonchi, or wheezes [id number], non tender, + BS, do not palpate liver spleen or masses Ext:no lower ext edema, 2+ pedal pulses, 2+ radial pulses Neuro:alert and oriented X 3, MAE, follows commands, + facial symmetry    EKG:  EKG is NOT ordered today.    Recent Labs: 06/01/2016: ALT 31; B Natriuretic Peptide 107.1 06/04/2016: BUN 20; Creatinine, Ser 0.70; Hemoglobin 9.5; Platelets 421; Potassium 3.7; Sodium 138    Lipid Panel    Component Value Date/Time   CHOL 144 04/27/2016 1133   CHOL 162 09/10/2014 1201   TRIG 80 04/27/2016 1133   TRIG 85 01/20/2016 0958     HDL 43 04/27/2016 1133   HDL 69 01/20/2016 0958   CHOLHDL 3.3 04/27/2016 1133   VLDL 16 04/27/2016 1133   LDLCALC 85 04/27/2016 1133   LDLCALC 78 09/10/2014 1201   LDLCALC 99 05/21/2014 1101       Other studies Reviewed: Additional studies/ records that were reviewed today include:  ECHO: Study Conclusions  - Left ventricle: The cavity size was normal. Wall thickness was   normal. Systolic function was normal. The estimated ejection   fraction was in the range of 60% to 65%. Although no diagnostic   regional wall motion abnormality was identified, this possibility   cannot be completely excluded on the basis of this study. There   was an increased relative contribution of atrial contraction to   ventricular filling, which may be due to hypovolemia. Left   ventricular diastolic function parameters were normal. Doppler   parameters are consistent with low ventricular filling pressure. - Mitral valve: Calcified annulus. - Pulmonary arteries: PA peak pressure: 34 mm Hg (S).   Carotid Dopplers: 06/02/16 Summary:  - The vertebral arteries appear patent with antegrade flow. - Findings consistent with 1- 39 percent stenosis involving the   right internal carotid artery and the left internal carotid   artery. Borderline >40% stenosis in right internal carotid.   ASSESSMENT AND PLAN:   1. Inf STEMI with stent to prox RCA.  04/27/16 On plavix and ASA   2. CAD residual disease 90% LCX with thrombus. And LM disease.  thought was to re-look after 4-6 weeks of conservative management. Now with no chest pain, will monitor, discussed with Dr. Tamala Julian. Follow up in 3 months with Dr. Tamala Julian  3.  Mobitz 2 with symptomatic bradycardia.  Holter after recent second hospitalization with elevated dilantin level and mobitz at hs without very slow Hr to 45 but not prolonged.   Average HR 80  4.  Hematuria, resolved.  + adenocarcinoma to follow up with urology  5.  Anemia with hematuria  stable  6.  Hyperlipidemia continue statin.  7.  Hx of seizures on dilantin. Now with therapeutic level  8.  Failure to thrive now with appetite and feeling better.   Pt and son and SNF notified to call if dizziness,  or chest pain.  Current medicines are reviewed with the patient today.  The patient Has no concerns regarding medicines.  The following changes have been made:  See above Labs/ tests ordered today include:see above  Disposition:   FU:  see above  Signed, Cecilie Kicks, NP  07/07/2016 9:03 PM    [facility] [address] [place], [place], [place], [place] Phone: [phone]; Fax: [phone]

## 2016-07-07 ENCOUNTER — Ambulatory Visit (INDEPENDENT_AMBULATORY_CARE_PROVIDER_SITE_OTHER): Payer: Medicare Other | Admitting: Cardiology

## 2016-07-07 ENCOUNTER — Encounter: Payer: Self-pay | Admitting: Cardiology

## 2016-07-07 VITALS — BP 120/50 | HR 67 | Ht 73.0 in | Wt 171.0 lb

## 2016-07-07 DIAGNOSIS — I2510 Atherosclerotic heart disease of native coronary artery without angina pectoris: Secondary | ICD-10-CM | POA: Diagnosis not present

## 2016-07-07 DIAGNOSIS — I2111 ST elevation (STEMI) myocardial infarction involving right coronary artery: Secondary | ICD-10-CM

## 2016-07-07 DIAGNOSIS — R319 Hematuria, unspecified: Secondary | ICD-10-CM | POA: Diagnosis not present

## 2016-07-07 DIAGNOSIS — I441 Atrioventricular block, second degree: Secondary | ICD-10-CM | POA: Diagnosis not present

## 2016-07-07 DIAGNOSIS — I252 Old myocardial infarction: Secondary | ICD-10-CM | POA: Diagnosis not present

## 2016-07-07 DIAGNOSIS — I10 Essential (primary) hypertension: Secondary | ICD-10-CM

## 2016-07-07 NOTE — Patient Instructions (Signed)
Medication Instructions:  Your physician recommends that you continue on your current medications as directed. Please refer to the Current Medication list given to you today.   Labwork: -None  Testing/Procedures: -None  Follow-Up: Your physician recommends that you keep your scheduled  follow-up appointment with Dr. Tamala Julian.   Any Other Special Instructions Will Be Listed Below (If Applicable).     If you need a refill on your cardiac medications before your next appointment, please call your pharmacy.

## 2016-07-08 ENCOUNTER — Encounter: Payer: Self-pay | Admitting: Family Medicine

## 2016-07-08 ENCOUNTER — Ambulatory Visit (INDEPENDENT_AMBULATORY_CARE_PROVIDER_SITE_OTHER): Payer: Medicare Other | Admitting: Family Medicine

## 2016-07-08 VITALS — BP 108/65 | HR 76 | Temp 97.1°F | Ht 73.0 in | Wt 178.0 lb

## 2016-07-08 DIAGNOSIS — I252 Old myocardial infarction: Secondary | ICD-10-CM

## 2016-07-08 DIAGNOSIS — D692 Other nonthrombocytopenic purpura: Secondary | ICD-10-CM | POA: Diagnosis not present

## 2016-07-08 DIAGNOSIS — I10 Essential (primary) hypertension: Secondary | ICD-10-CM | POA: Diagnosis not present

## 2016-07-08 DIAGNOSIS — E7800 Pure hypercholesterolemia, unspecified: Secondary | ICD-10-CM

## 2016-07-08 DIAGNOSIS — C679 Malignant neoplasm of bladder, unspecified: Secondary | ICD-10-CM

## 2016-07-08 DIAGNOSIS — E559 Vitamin D deficiency, unspecified: Secondary | ICD-10-CM

## 2016-07-08 DIAGNOSIS — G40909 Epilepsy, unspecified, not intractable, without status epilepticus: Secondary | ICD-10-CM | POA: Diagnosis not present

## 2016-07-08 DIAGNOSIS — K219 Gastro-esophageal reflux disease without esophagitis: Secondary | ICD-10-CM | POA: Diagnosis not present

## 2016-07-08 DIAGNOSIS — Z23 Encounter for immunization: Secondary | ICD-10-CM

## 2016-07-08 DIAGNOSIS — C44612 Basal cell carcinoma of skin of right upper limb, including shoulder: Secondary | ICD-10-CM

## 2016-07-08 DIAGNOSIS — R531 Weakness: Secondary | ICD-10-CM | POA: Diagnosis not present

## 2016-07-08 DIAGNOSIS — R2681 Unsteadiness on feet: Secondary | ICD-10-CM | POA: Diagnosis not present

## 2016-07-08 DIAGNOSIS — I2510 Atherosclerotic heart disease of native coronary artery without angina pectoris: Secondary | ICD-10-CM | POA: Diagnosis not present

## 2016-07-08 NOTE — Patient Instructions (Addendum)
Follow-up with cardiology as planned Continue to work on physical therapy to get stronger even when you come home we will make sure that we have a therapist to work with you at home. Continue to be careful and did not put yourself at risk for falling, usual walker and wheelchair until your strength is improved Your bones are much easier to fracture at this time and therefore you must be careful. The flu shot that you received today may make your arm sore Do not forget the necklace that you can wear at home so that if you fall you can call somebody immediately by pressing the button Do not forget to follow-up with the urologist Do not forget to follow-up with the dermatologist We will call with results of all of the lab work as soon as those results become available

## 2016-07-08 NOTE — Addendum Note (Signed)
Addended by: Zannie Cove on: 07/08/2016 04:52 PM   Modules accepted: Orders

## 2016-07-08 NOTE — Progress Notes (Signed)
Subjective:    Patient ID: Joshua [name], male    DOB: [birth year], 82 y.o.   MRN: [id number]  HPIPatient here today for follow up from hospital and he is soon to be discharges from [facility] rehab.The patient comes in today from the long term care [facility] following an MI that was an inferior MI back in the summertime. He also has a history of seizure disorder from a cerebral hemorrhage from many years ago. When he was discharged back to the assisted living facility he was doing quite well for short period time developed some respiratory congestion and cough and dense literally stopped walking and became very weak. It was found out at the time that his Dilantin level in the hospital and at the nursing home was very elevated. He had no appetite. He was very frustrated and depressed. He has since been started on an antidepressant and the last reported Dilantin level was around 15 or 16 which is more like what he is had in the past. He has made some improvement recently and we anticipate him to come home from the nursing home soon with 24-hour care. Until his strength is improved more. His attitude seems to be more positive. He still has follow-ups to do with the urologist because of prostate cancer and bladder cancer and that still is in the works. He also will be following up with the cardiologist.     Patient Active Problem List   Diagnosis Date Noted  . Weakness   . Aortic atherosclerosis ([place]) 06/03/2016  . General weakness   . Protein-calorie malnutrition, severe ([place])   . Failure to thrive in adult   . Bradycardia 06/02/2016  . GI bleed 06/01/2016  . Slurred speech 06/01/2016  . Pyrexia   . Leukocytosis   . 1st degree AV block   . Cardiogenic shock ([place])   . AV block, Mobitz 2 04/27/2016  . ST elevation (STEMI) myocardial infarction involving other coronary artery of inferior wall ([place]) 04/27/2016  . STEMI (ST elevation myocardial infarction) ([place]) 04/27/2016  . Bladder  cancer ([place]) 04/18/2016  . Vitamin D deficiency 10/15/2015  . Prostate cancer ([place])   . Hypercholesterolemia   . Essential hypertension   . Seizures ([place])   . GERD 08/22/2008  . COLONIC POLYPS, ADENOMATOUS, HX OF 08/22/2008   Outpatient Encounter Prescriptions as of 07/08/2016  Medication Sig  . aspirin EC 81 MG tablet Take 1 tablet (81 mg total) by mouth daily.  Marland Kitchen atorvastatin (LIPITOR) 20 MG tablet Take 20 mg by mouth daily.  . Cholecalciferol (VITAMIN D3) 1000 units CAPS Take 1 capsule by mouth daily.  . citalopram (CELEXA) 10 MG tablet Take 1 tablet (10 mg total) by mouth daily.  . clopidogrel (PLAVIX) 75 MG tablet Take 1 tablet (75 mg total) by mouth daily.  . ergocalciferol (VITAMIN D2) 50000 units capsule Take 50,000 Units by mouth once a week.  . feeding supplement, ENSURE ENLIVE, (ENSURE ENLIVE) LIQD Take 237 mLs by mouth 2 (two) times daily between meals.  . magnesium chloride (SLOW-MAG) 64 MG TBEC SR tablet Take 1 tablet (64 mg total) by mouth daily.  . magnesium hydroxide (MILK OF MAGNESIA) 400 MG/5ML suspension Take 30 mLs by mouth daily as needed for mild constipation.  Marland Kitchen PHENobarbital (LUMINAL) 64.8 MG tablet Take 1 tablet (64.8 mg total) by mouth 2 (two) times daily.  . phenytoin (DILANTIN) 200 MG ER capsule Take 1 capsule (200 mg total) by mouth 2 (two) times daily.  Marland Kitchen  polyethylene glycol (MIRALAX / GLYCOLAX) packet Take 17 g by mouth daily as needed for mild constipation or moderate constipation.  . predniSONE (DELTASONE) 20 MG tablet Take 20 mg by mouth daily with breakfast.  . ranitidine (ZANTAC) 300 MG tablet Take 300 mg by mouth daily.   No facility-administered encounter medications on file as of 07/08/2016.      Review of Systems  Constitutional: Negative.   HENT: Negative.   Eyes: Negative.   Respiratory: Negative.   Cardiovascular: Negative.   Gastrointestinal: Negative.   Endocrine: Negative.   Genitourinary: Negative.   Musculoskeletal: Negative.     Skin: Negative.   Allergic/Immunologic: Negative.   Neurological: Negative.   Hematological: Negative.   Psychiatric/Behavioral: Negative.        Objective:   Physical Exam  Constitutional: He is oriented to person, place, and time. He appears well-developed and well-nourished. No distress.  The patient comes with his daughter to the visit today. He is in much better spirits than he was when I visited him in the nursing facility recently. He started to walk with a walker and has walked as much as 300 feet. His attitude has improved and he is determined to get better.  HENT:  Head: Normocephalic and atraumatic.  Right Ear: External ear normal.  Left Ear: External ear normal.  Nose: Nose normal.  Mouth/Throat: Oropharynx is clear and moist. No oropharyngeal exudate.  There is some ears cerumen but we will work on that later.  Eyes: Conjunctivae and EOM are normal. Pupils are equal, round, and reactive to light. Right eye exhibits no discharge. Left eye exhibits no discharge. No scleral icterus.  Neck: Normal range of motion. Neck supple. No thyromegaly present.  Cardiovascular: Normal rate, regular rhythm and normal heart sounds.   No murmur heard. The heart had a regular rate and rhythm at 72/m  Pulmonary/Chest: Effort normal and breath sounds normal. No respiratory distress. He has no wheezes. He has no rales.  Abdominal: Soft. Bowel sounds are normal. He exhibits no mass. There is no tenderness. There is no rebound and no guarding.  The abdomen was soft without liver or spleen enlargement or masses.  Genitourinary:  Genitourinary Comments: Patient has plans to follow-up with the urologist and has an appointment for doing so.  Musculoskeletal: He exhibits no edema.  He is able to stand and walk now and this is improving on a daily basis.  Lymphadenopathy:    He has no cervical adenopathy.  Neurological: He is alert and oriented to person, place, and time. He has normal reflexes. No  cranial nerve deficit.  Skin: Skin is warm and dry. No rash noted. No erythema.  Senile purpura, also he appears to have a basal cell skin cancer of his right forearm which she has an appointment to follow-up on.  Psychiatric: He has a normal mood and affect. His behavior is normal. Judgment and thought content normal.  Nursing note and vitals reviewed.  BP 108/65 (BP Location: Right Arm)   Pulse 76   Temp 97.1 F (36.2 C) (Oral)   Ht 6' 1" (1.854 m)   Wt 178 lb (80.7 kg)   BMI 23.48 kg/m         Assessment & Plan:  1. Essential hypertension -The blood pressure is good today and he will continue with current treatment - BMP8+EGFR - CBC with Differential/Platelet - Hepatic function panel  2. Hypercholesterolemia -Continue with atorvastatin pending results of lab work - CBC with Differential/Platelet - Lipid panel  3. Vitamin D deficiency -Continue with current treatment pending results of lab work - CBC with Differential/Platelet - VITAMIN D 25 Hydroxy (Vit-D Deficiency, Fractures)  4. Gastroesophageal reflux disease, esophagitis presence not specified -The patient is doing well with his stomach and he will continue with his ranitidine. - CBC with Differential/Platelet - Hepatic function panel  5. Seizure disorder ([place]) -Continue his seizure meds and check Dilantin level - CBC with Differential/Platelet - Phenytoin level, free and total  6. Senile purpura ([place]) -The patient is reminded to be careful not put himself at risk for falling  7. Basal cell carcinoma of right upper arm -Follow-up with dermatology as planned  8. Malignant neoplasm of urinary bladder, unspecified site [facility]) -Follow-up with urology as planned  9. ASCVD -Follow-up with cardiology as planned  10. Face-to-face visit for Medicare purposes and home health purposes.  Patient Instructions  Follow-up with cardiology as planned Continue to work on physical therapy to get stronger even when  you come home we will make sure that we have a therapist to work with you at home. Continue to be careful and did not put yourself at risk for falling, usual walker and wheelchair until your strength is improved Your bones are much easier to fracture at this time and therefore you must be careful. The flu shot that you received today may make your arm sore Do not forget the necklace that you can wear at home so that if you fall you can call somebody immediately by pressing the button Do not forget to follow-up with the urologist Do not forget to follow-up with the dermatologist We will call with results of all of the lab work as soon as those results become available  Joshua Senate MD

## 2016-07-11 ENCOUNTER — Telehealth: Payer: Self-pay | Admitting: Family Medicine

## 2016-07-11 ENCOUNTER — Other Ambulatory Visit: Payer: Self-pay | Admitting: *Deleted

## 2016-07-11 DIAGNOSIS — G40909 Epilepsy, unspecified, not intractable, without status epilepticus: Secondary | ICD-10-CM

## 2016-07-11 LAB — CBC WITH DIFFERENTIAL/PLATELET
Basophils Absolute: 0 10*3/uL (ref 0.0–0.2)
Basos: 0 %
EOS (ABSOLUTE): 0.2 10*3/uL (ref 0.0–0.4)
EOS: 1 %
HEMATOCRIT: 29.3 % — AB (ref 37.5–51.0)
HEMOGLOBIN: 9.3 g/dL — AB (ref 12.6–17.7)
IMMATURE GRANS (ABS): 0 10*3/uL (ref 0.0–0.1)
Immature Granulocytes: 0 %
LYMPHS: 11 %
Lymphocytes Absolute: 1.2 10*3/uL (ref 0.7–3.1)
MCH: 24.9 pg — ABNORMAL LOW (ref 26.6–33.0)
MCHC: 31.7 g/dL (ref 31.5–35.7)
MCV: 78 fL — ABNORMAL LOW (ref 79–97)
MONOCYTES: 4 %
Monocytes Absolute: 0.4 10*3/uL (ref 0.1–0.9)
NEUTROS ABS: 9.1 10*3/uL — AB (ref 1.4–7.0)
Neutrophils: 84 %
Platelets: 403 10*3/uL — ABNORMAL HIGH (ref 150–379)
RBC: 3.74 x10E6/uL — AB (ref 4.14–5.80)
RDW: 16.8 % — ABNORMAL HIGH (ref 12.3–15.4)
WBC: 11 10*3/uL — AB (ref 3.4–10.8)

## 2016-07-11 LAB — BMP8+EGFR
BUN/Creatinine Ratio: 17 (ref 10–24)
BUN: 16 mg/dL (ref 8–27)
CALCIUM: 8.9 mg/dL (ref 8.6–10.2)
CHLORIDE: 102 mmol/L (ref 96–106)
CO2: 21 mmol/L (ref 18–29)
CREATININE: 0.95 mg/dL (ref 0.76–1.27)
GFR calc non Af Amer: 74 mL/min/{1.73_m2} (ref >59)
GFR, EST AFRICAN AMERICAN: 86 mL/min/{1.73_m2} (ref >59)
Glucose: 101 mg/dL — ABNORMAL HIGH (ref 65–99)
Potassium: 4.8 mmol/L (ref 3.5–5.2)
Sodium: 142 mmol/L (ref 134–144)

## 2016-07-11 LAB — HEPATIC FUNCTION PANEL
ALBUMIN: 3.6 g/dL (ref 3.5–4.7)
ALK PHOS: 193 IU/L — AB (ref 39–117)
ALT: 27 IU/L (ref 0–44)
AST: 26 IU/L (ref 0–40)
BILIRUBIN, DIRECT: 0.07 mg/dL (ref 0.00–0.40)
Bilirubin Total: <0.2 mg/dL (ref 0.0–1.2)
Total Protein: 6.5 g/dL (ref 6.0–8.5)

## 2016-07-11 LAB — LIPID PANEL
CHOL/HDL RATIO: 2.2 ratio (ref 0.0–5.0)
Cholesterol, Total: 165 mg/dL (ref 100–199)
HDL: 74 mg/dL (ref >39)
LDL CALC: 64 mg/dL (ref 0–99)
Triglycerides: 135 mg/dL (ref 0–149)
VLDL Cholesterol Cal: 27 mg/dL (ref 5–40)

## 2016-07-11 LAB — PHENYTOIN LEVEL, FREE AND TOTAL

## 2016-07-11 LAB — VITAMIN D 25 HYDROXY (VIT D DEFICIENCY, FRACTURES): VIT D 25 HYDROXY: 43.3 ng/mL (ref 30.0–100.0)

## 2016-07-11 NOTE — Telephone Encounter (Signed)
Aware of lab results & need to return to have Dilantin lever redrawn.

## 2016-07-13 ENCOUNTER — Telehealth: Payer: Self-pay | Admitting: Family Medicine

## 2016-07-13 DIAGNOSIS — I213 ST elevation (STEMI) myocardial infarction of unspecified site: Secondary | ICD-10-CM | POA: Diagnosis not present

## 2016-07-13 DIAGNOSIS — Z955 Presence of coronary angioplasty implant and graft: Secondary | ICD-10-CM | POA: Diagnosis not present

## 2016-07-13 DIAGNOSIS — Z9181 History of falling: Secondary | ICD-10-CM | POA: Diagnosis not present

## 2016-07-13 DIAGNOSIS — I441 Atrioventricular block, second degree: Secondary | ICD-10-CM | POA: Diagnosis not present

## 2016-07-14 ENCOUNTER — Telehealth: Payer: Self-pay | Admitting: Family Medicine

## 2016-07-14 NOTE — Telephone Encounter (Signed)
Patient care taker aware that coupon will be placed up front.

## 2016-07-18 ENCOUNTER — Other Ambulatory Visit: Payer: Self-pay | Admitting: Family Medicine

## 2016-07-18 ENCOUNTER — Other Ambulatory Visit: Payer: Self-pay

## 2016-07-18 DIAGNOSIS — I252 Old myocardial infarction: Secondary | ICD-10-CM | POA: Diagnosis not present

## 2016-07-18 DIAGNOSIS — Z0289 Encounter for other administrative examinations: Secondary | ICD-10-CM

## 2016-07-18 DIAGNOSIS — E43 Unspecified severe protein-calorie malnutrition: Secondary | ICD-10-CM | POA: Diagnosis not present

## 2016-07-18 DIAGNOSIS — M6281 Muscle weakness (generalized): Secondary | ICD-10-CM | POA: Diagnosis not present

## 2016-07-18 DIAGNOSIS — Z9181 History of falling: Secondary | ICD-10-CM | POA: Diagnosis not present

## 2016-07-18 DIAGNOSIS — R001 Bradycardia, unspecified: Secondary | ICD-10-CM | POA: Diagnosis not present

## 2016-07-18 DIAGNOSIS — F17211 Nicotine dependence, cigarettes, in remission: Secondary | ICD-10-CM | POA: Diagnosis not present

## 2016-07-18 DIAGNOSIS — I509 Heart failure, unspecified: Secondary | ICD-10-CM | POA: Diagnosis not present

## 2016-07-18 MED ORDER — ATORVASTATIN CALCIUM 20 MG PO TABS: 20.0000 mg | ORAL_TABLET | Freq: Every day | ORAL | 1 refills | Status: DC

## 2016-07-18 MED ORDER — CITALOPRAM HYDROBROMIDE 10 MG PO TABS: 10.0000 mg | ORAL_TABLET | Freq: Every day | ORAL | 1 refills | Status: DC

## 2016-07-18 MED ORDER — ERGOCALCIFEROL 1.25 MG (50000 UT) PO CAPS: 50000.0000 [IU] | ORAL_CAPSULE | ORAL | 1 refills | Status: DC

## 2016-07-18 NOTE — Telephone Encounter (Signed)
Prescriptions sent in, patient's son informed

## 2016-07-19 DIAGNOSIS — I509 Heart failure, unspecified: Secondary | ICD-10-CM | POA: Diagnosis not present

## 2016-07-19 DIAGNOSIS — I252 Old myocardial infarction: Secondary | ICD-10-CM | POA: Diagnosis not present

## 2016-07-19 DIAGNOSIS — R001 Bradycardia, unspecified: Secondary | ICD-10-CM | POA: Diagnosis not present

## 2016-07-19 DIAGNOSIS — Z9181 History of falling: Secondary | ICD-10-CM | POA: Diagnosis not present

## 2016-07-19 DIAGNOSIS — M6281 Muscle weakness (generalized): Secondary | ICD-10-CM | POA: Diagnosis not present

## 2016-07-19 DIAGNOSIS — E43 Unspecified severe protein-calorie malnutrition: Secondary | ICD-10-CM | POA: Diagnosis not present

## 2016-07-20 DIAGNOSIS — Z8546 Personal history of malignant neoplasm of prostate: Secondary | ICD-10-CM | POA: Diagnosis not present

## 2016-07-20 DIAGNOSIS — Z9181 History of falling: Secondary | ICD-10-CM | POA: Diagnosis not present

## 2016-07-20 DIAGNOSIS — E43 Unspecified severe protein-calorie malnutrition: Secondary | ICD-10-CM | POA: Diagnosis not present

## 2016-07-20 DIAGNOSIS — I509 Heart failure, unspecified: Secondary | ICD-10-CM | POA: Diagnosis not present

## 2016-07-20 DIAGNOSIS — R001 Bradycardia, unspecified: Secondary | ICD-10-CM | POA: Diagnosis not present

## 2016-07-20 DIAGNOSIS — I252 Old myocardial infarction: Secondary | ICD-10-CM | POA: Diagnosis not present

## 2016-07-20 DIAGNOSIS — M6281 Muscle weakness (generalized): Secondary | ICD-10-CM | POA: Diagnosis not present

## 2016-07-20 DIAGNOSIS — Z8551 Personal history of malignant neoplasm of bladder: Secondary | ICD-10-CM | POA: Diagnosis not present

## 2016-07-20 NOTE — Telephone Encounter (Signed)
Several attempts to contact Emergency contact returning call and no answer or call back. This encounter will be closed.

## 2016-07-21 DIAGNOSIS — I509 Heart failure, unspecified: Secondary | ICD-10-CM | POA: Diagnosis not present

## 2016-07-21 DIAGNOSIS — R001 Bradycardia, unspecified: Secondary | ICD-10-CM | POA: Diagnosis not present

## 2016-07-21 DIAGNOSIS — E43 Unspecified severe protein-calorie malnutrition: Secondary | ICD-10-CM | POA: Diagnosis not present

## 2016-07-21 DIAGNOSIS — I252 Old myocardial infarction: Secondary | ICD-10-CM | POA: Diagnosis not present

## 2016-07-21 DIAGNOSIS — Z9181 History of falling: Secondary | ICD-10-CM | POA: Diagnosis not present

## 2016-07-21 DIAGNOSIS — M6281 Muscle weakness (generalized): Secondary | ICD-10-CM | POA: Diagnosis not present

## 2016-07-22 DIAGNOSIS — E43 Unspecified severe protein-calorie malnutrition: Secondary | ICD-10-CM | POA: Diagnosis not present

## 2016-07-22 DIAGNOSIS — M6281 Muscle weakness (generalized): Secondary | ICD-10-CM | POA: Diagnosis not present

## 2016-07-22 DIAGNOSIS — R001 Bradycardia, unspecified: Secondary | ICD-10-CM | POA: Diagnosis not present

## 2016-07-22 DIAGNOSIS — I252 Old myocardial infarction: Secondary | ICD-10-CM | POA: Diagnosis not present

## 2016-07-22 DIAGNOSIS — I509 Heart failure, unspecified: Secondary | ICD-10-CM | POA: Diagnosis not present

## 2016-07-22 DIAGNOSIS — Z9181 History of falling: Secondary | ICD-10-CM | POA: Diagnosis not present

## 2016-07-25 ENCOUNTER — Telehealth: Payer: Self-pay | Admitting: Family Medicine

## 2016-07-25 DIAGNOSIS — I509 Heart failure, unspecified: Secondary | ICD-10-CM | POA: Diagnosis not present

## 2016-07-25 DIAGNOSIS — R001 Bradycardia, unspecified: Secondary | ICD-10-CM | POA: Diagnosis not present

## 2016-07-25 DIAGNOSIS — I252 Old myocardial infarction: Secondary | ICD-10-CM | POA: Diagnosis not present

## 2016-07-25 DIAGNOSIS — M6281 Muscle weakness (generalized): Secondary | ICD-10-CM | POA: Diagnosis not present

## 2016-07-25 DIAGNOSIS — E43 Unspecified severe protein-calorie malnutrition: Secondary | ICD-10-CM | POA: Diagnosis not present

## 2016-07-25 DIAGNOSIS — Z9181 History of falling: Secondary | ICD-10-CM | POA: Diagnosis not present

## 2016-07-26 DIAGNOSIS — I252 Old myocardial infarction: Secondary | ICD-10-CM | POA: Diagnosis not present

## 2016-07-26 DIAGNOSIS — Z9181 History of falling: Secondary | ICD-10-CM | POA: Diagnosis not present

## 2016-07-26 DIAGNOSIS — I509 Heart failure, unspecified: Secondary | ICD-10-CM | POA: Diagnosis not present

## 2016-07-26 DIAGNOSIS — M6281 Muscle weakness (generalized): Secondary | ICD-10-CM | POA: Diagnosis not present

## 2016-07-26 DIAGNOSIS — R001 Bradycardia, unspecified: Secondary | ICD-10-CM | POA: Diagnosis not present

## 2016-07-26 DIAGNOSIS — E43 Unspecified severe protein-calorie malnutrition: Secondary | ICD-10-CM | POA: Diagnosis not present

## 2016-07-27 DIAGNOSIS — Z9181 History of falling: Secondary | ICD-10-CM | POA: Diagnosis not present

## 2016-07-27 DIAGNOSIS — R001 Bradycardia, unspecified: Secondary | ICD-10-CM | POA: Diagnosis not present

## 2016-07-27 DIAGNOSIS — M6281 Muscle weakness (generalized): Secondary | ICD-10-CM | POA: Diagnosis not present

## 2016-07-27 DIAGNOSIS — I509 Heart failure, unspecified: Secondary | ICD-10-CM | POA: Diagnosis not present

## 2016-07-27 DIAGNOSIS — I252 Old myocardial infarction: Secondary | ICD-10-CM | POA: Diagnosis not present

## 2016-07-27 DIAGNOSIS — E43 Unspecified severe protein-calorie malnutrition: Secondary | ICD-10-CM | POA: Diagnosis not present

## 2016-07-27 NOTE — Telephone Encounter (Signed)
Picked up by family on 07/27/16

## 2016-07-28 DIAGNOSIS — Z9181 History of falling: Secondary | ICD-10-CM | POA: Diagnosis not present

## 2016-07-28 DIAGNOSIS — E43 Unspecified severe protein-calorie malnutrition: Secondary | ICD-10-CM | POA: Diagnosis not present

## 2016-07-28 DIAGNOSIS — I509 Heart failure, unspecified: Secondary | ICD-10-CM | POA: Diagnosis not present

## 2016-07-28 DIAGNOSIS — R001 Bradycardia, unspecified: Secondary | ICD-10-CM | POA: Diagnosis not present

## 2016-07-28 DIAGNOSIS — M6281 Muscle weakness (generalized): Secondary | ICD-10-CM | POA: Diagnosis not present

## 2016-07-28 DIAGNOSIS — I252 Old myocardial infarction: Secondary | ICD-10-CM | POA: Diagnosis not present

## 2016-07-29 DIAGNOSIS — R001 Bradycardia, unspecified: Secondary | ICD-10-CM | POA: Diagnosis not present

## 2016-07-29 DIAGNOSIS — M6281 Muscle weakness (generalized): Secondary | ICD-10-CM | POA: Diagnosis not present

## 2016-07-29 DIAGNOSIS — E43 Unspecified severe protein-calorie malnutrition: Secondary | ICD-10-CM | POA: Diagnosis not present

## 2016-07-29 DIAGNOSIS — I509 Heart failure, unspecified: Secondary | ICD-10-CM | POA: Diagnosis not present

## 2016-07-29 DIAGNOSIS — Z9181 History of falling: Secondary | ICD-10-CM | POA: Diagnosis not present

## 2016-07-29 DIAGNOSIS — I252 Old myocardial infarction: Secondary | ICD-10-CM | POA: Diagnosis not present

## 2016-08-01 DIAGNOSIS — L820 Inflamed seborrheic keratosis: Secondary | ICD-10-CM | POA: Diagnosis not present

## 2016-08-01 DIAGNOSIS — Z85828 Personal history of other malignant neoplasm of skin: Secondary | ICD-10-CM | POA: Diagnosis not present

## 2016-08-01 DIAGNOSIS — M6281 Muscle weakness (generalized): Secondary | ICD-10-CM | POA: Diagnosis not present

## 2016-08-01 DIAGNOSIS — E43 Unspecified severe protein-calorie malnutrition: Secondary | ICD-10-CM | POA: Diagnosis not present

## 2016-08-01 DIAGNOSIS — I252 Old myocardial infarction: Secondary | ICD-10-CM | POA: Diagnosis not present

## 2016-08-01 DIAGNOSIS — Z9181 History of falling: Secondary | ICD-10-CM | POA: Diagnosis not present

## 2016-08-01 DIAGNOSIS — I509 Heart failure, unspecified: Secondary | ICD-10-CM | POA: Diagnosis not present

## 2016-08-01 DIAGNOSIS — R001 Bradycardia, unspecified: Secondary | ICD-10-CM | POA: Diagnosis not present

## 2016-08-01 DIAGNOSIS — C44622 Squamous cell carcinoma of skin of right upper limb, including shoulder: Secondary | ICD-10-CM | POA: Diagnosis not present

## 2016-08-02 DIAGNOSIS — I252 Old myocardial infarction: Secondary | ICD-10-CM | POA: Diagnosis not present

## 2016-08-02 DIAGNOSIS — R001 Bradycardia, unspecified: Secondary | ICD-10-CM | POA: Diagnosis not present

## 2016-08-02 DIAGNOSIS — Z9181 History of falling: Secondary | ICD-10-CM | POA: Diagnosis not present

## 2016-08-02 DIAGNOSIS — M6281 Muscle weakness (generalized): Secondary | ICD-10-CM | POA: Diagnosis not present

## 2016-08-02 DIAGNOSIS — E43 Unspecified severe protein-calorie malnutrition: Secondary | ICD-10-CM | POA: Diagnosis not present

## 2016-08-02 DIAGNOSIS — I509 Heart failure, unspecified: Secondary | ICD-10-CM | POA: Diagnosis not present

## 2016-08-03 DIAGNOSIS — E43 Unspecified severe protein-calorie malnutrition: Secondary | ICD-10-CM | POA: Diagnosis not present

## 2016-08-03 DIAGNOSIS — Z9181 History of falling: Secondary | ICD-10-CM | POA: Diagnosis not present

## 2016-08-03 DIAGNOSIS — I252 Old myocardial infarction: Secondary | ICD-10-CM | POA: Diagnosis not present

## 2016-08-03 DIAGNOSIS — I509 Heart failure, unspecified: Secondary | ICD-10-CM | POA: Diagnosis not present

## 2016-08-03 DIAGNOSIS — M6281 Muscle weakness (generalized): Secondary | ICD-10-CM | POA: Diagnosis not present

## 2016-08-03 DIAGNOSIS — R001 Bradycardia, unspecified: Secondary | ICD-10-CM | POA: Diagnosis not present

## 2016-08-04 DIAGNOSIS — I252 Old myocardial infarction: Secondary | ICD-10-CM | POA: Diagnosis not present

## 2016-08-04 DIAGNOSIS — I509 Heart failure, unspecified: Secondary | ICD-10-CM | POA: Diagnosis not present

## 2016-08-04 DIAGNOSIS — R001 Bradycardia, unspecified: Secondary | ICD-10-CM | POA: Diagnosis not present

## 2016-08-04 DIAGNOSIS — M6281 Muscle weakness (generalized): Secondary | ICD-10-CM | POA: Diagnosis not present

## 2016-08-04 DIAGNOSIS — E43 Unspecified severe protein-calorie malnutrition: Secondary | ICD-10-CM | POA: Diagnosis not present

## 2016-08-04 DIAGNOSIS — Z9181 History of falling: Secondary | ICD-10-CM | POA: Diagnosis not present

## 2016-08-05 DIAGNOSIS — I509 Heart failure, unspecified: Secondary | ICD-10-CM | POA: Diagnosis not present

## 2016-08-05 DIAGNOSIS — E43 Unspecified severe protein-calorie malnutrition: Secondary | ICD-10-CM | POA: Diagnosis not present

## 2016-08-05 DIAGNOSIS — M6281 Muscle weakness (generalized): Secondary | ICD-10-CM | POA: Diagnosis not present

## 2016-08-05 DIAGNOSIS — R001 Bradycardia, unspecified: Secondary | ICD-10-CM | POA: Diagnosis not present

## 2016-08-05 DIAGNOSIS — I252 Old myocardial infarction: Secondary | ICD-10-CM | POA: Diagnosis not present

## 2016-08-05 DIAGNOSIS — Z9181 History of falling: Secondary | ICD-10-CM | POA: Diagnosis not present

## 2016-08-08 DIAGNOSIS — R001 Bradycardia, unspecified: Secondary | ICD-10-CM | POA: Diagnosis not present

## 2016-08-08 DIAGNOSIS — Z9181 History of falling: Secondary | ICD-10-CM | POA: Diagnosis not present

## 2016-08-08 DIAGNOSIS — E43 Unspecified severe protein-calorie malnutrition: Secondary | ICD-10-CM | POA: Diagnosis not present

## 2016-08-08 DIAGNOSIS — I509 Heart failure, unspecified: Secondary | ICD-10-CM | POA: Diagnosis not present

## 2016-08-08 DIAGNOSIS — I252 Old myocardial infarction: Secondary | ICD-10-CM | POA: Diagnosis not present

## 2016-08-08 DIAGNOSIS — M6281 Muscle weakness (generalized): Secondary | ICD-10-CM | POA: Diagnosis not present

## 2016-08-09 DIAGNOSIS — E43 Unspecified severe protein-calorie malnutrition: Secondary | ICD-10-CM | POA: Diagnosis not present

## 2016-08-09 DIAGNOSIS — I509 Heart failure, unspecified: Secondary | ICD-10-CM | POA: Diagnosis not present

## 2016-08-09 DIAGNOSIS — Z9181 History of falling: Secondary | ICD-10-CM | POA: Diagnosis not present

## 2016-08-09 DIAGNOSIS — M6281 Muscle weakness (generalized): Secondary | ICD-10-CM | POA: Diagnosis not present

## 2016-08-09 DIAGNOSIS — R001 Bradycardia, unspecified: Secondary | ICD-10-CM | POA: Diagnosis not present

## 2016-08-09 DIAGNOSIS — I252 Old myocardial infarction: Secondary | ICD-10-CM | POA: Diagnosis not present

## 2016-08-10 DIAGNOSIS — E43 Unspecified severe protein-calorie malnutrition: Secondary | ICD-10-CM | POA: Diagnosis not present

## 2016-08-10 DIAGNOSIS — R001 Bradycardia, unspecified: Secondary | ICD-10-CM | POA: Diagnosis not present

## 2016-08-10 DIAGNOSIS — M6281 Muscle weakness (generalized): Secondary | ICD-10-CM | POA: Diagnosis not present

## 2016-08-10 DIAGNOSIS — Z9181 History of falling: Secondary | ICD-10-CM | POA: Diagnosis not present

## 2016-08-10 DIAGNOSIS — I252 Old myocardial infarction: Secondary | ICD-10-CM | POA: Diagnosis not present

## 2016-08-10 DIAGNOSIS — I509 Heart failure, unspecified: Secondary | ICD-10-CM | POA: Diagnosis not present

## 2016-08-12 ENCOUNTER — Telehealth: Payer: Self-pay | Admitting: Family Medicine

## 2016-08-12 NOTE — Telephone Encounter (Signed)
canot find n chart who initially started this- that is a very high dos eto continue daily for a lon period of time. He really needs to talk to whom rx it originally.

## 2016-08-15 DIAGNOSIS — R001 Bradycardia, unspecified: Secondary | ICD-10-CM | POA: Diagnosis not present

## 2016-08-15 DIAGNOSIS — I509 Heart failure, unspecified: Secondary | ICD-10-CM | POA: Diagnosis not present

## 2016-08-15 DIAGNOSIS — M6281 Muscle weakness (generalized): Secondary | ICD-10-CM | POA: Diagnosis not present

## 2016-08-15 DIAGNOSIS — Z9181 History of falling: Secondary | ICD-10-CM | POA: Diagnosis not present

## 2016-08-15 DIAGNOSIS — I252 Old myocardial infarction: Secondary | ICD-10-CM | POA: Diagnosis not present

## 2016-08-15 DIAGNOSIS — E43 Unspecified severe protein-calorie malnutrition: Secondary | ICD-10-CM | POA: Diagnosis not present

## 2016-08-17 DIAGNOSIS — I509 Heart failure, unspecified: Secondary | ICD-10-CM | POA: Diagnosis not present

## 2016-08-17 DIAGNOSIS — M6281 Muscle weakness (generalized): Secondary | ICD-10-CM | POA: Diagnosis not present

## 2016-08-17 DIAGNOSIS — E43 Unspecified severe protein-calorie malnutrition: Secondary | ICD-10-CM | POA: Diagnosis not present

## 2016-08-17 DIAGNOSIS — I252 Old myocardial infarction: Secondary | ICD-10-CM | POA: Diagnosis not present

## 2016-08-17 DIAGNOSIS — R001 Bradycardia, unspecified: Secondary | ICD-10-CM | POA: Diagnosis not present

## 2016-08-17 DIAGNOSIS — Z9181 History of falling: Secondary | ICD-10-CM | POA: Diagnosis not present

## 2016-08-19 DIAGNOSIS — R001 Bradycardia, unspecified: Secondary | ICD-10-CM | POA: Diagnosis not present

## 2016-08-19 DIAGNOSIS — M6281 Muscle weakness (generalized): Secondary | ICD-10-CM | POA: Diagnosis not present

## 2016-08-19 DIAGNOSIS — I252 Old myocardial infarction: Secondary | ICD-10-CM | POA: Diagnosis not present

## 2016-08-19 DIAGNOSIS — Z9181 History of falling: Secondary | ICD-10-CM | POA: Diagnosis not present

## 2016-08-19 DIAGNOSIS — I509 Heart failure, unspecified: Secondary | ICD-10-CM | POA: Diagnosis not present

## 2016-08-19 DIAGNOSIS — E43 Unspecified severe protein-calorie malnutrition: Secondary | ICD-10-CM | POA: Diagnosis not present

## 2016-08-22 ENCOUNTER — Ambulatory Visit (INDEPENDENT_AMBULATORY_CARE_PROVIDER_SITE_OTHER): Payer: Medicare Other | Admitting: Family Medicine

## 2016-08-22 ENCOUNTER — Encounter: Payer: Self-pay | Admitting: Family Medicine

## 2016-08-22 VITALS — BP 109/53 | HR 74 | Temp 97.6°F | Ht 73.0 in | Wt 183.0 lb

## 2016-08-22 DIAGNOSIS — G40909 Epilepsy, unspecified, not intractable, without status epilepticus: Secondary | ICD-10-CM

## 2016-08-22 DIAGNOSIS — C44612 Basal cell carcinoma of skin of right upper limb, including shoulder: Secondary | ICD-10-CM

## 2016-08-22 DIAGNOSIS — I2510 Atherosclerotic heart disease of native coronary artery without angina pectoris: Secondary | ICD-10-CM | POA: Diagnosis not present

## 2016-08-22 DIAGNOSIS — R2681 Unsteadiness on feet: Secondary | ICD-10-CM | POA: Diagnosis not present

## 2016-08-22 DIAGNOSIS — I10 Essential (primary) hypertension: Secondary | ICD-10-CM | POA: Diagnosis not present

## 2016-08-22 DIAGNOSIS — E7800 Pure hypercholesterolemia, unspecified: Secondary | ICD-10-CM

## 2016-08-22 DIAGNOSIS — E559 Vitamin D deficiency, unspecified: Secondary | ICD-10-CM | POA: Diagnosis not present

## 2016-08-22 MED ORDER — PHENOBARBITAL 64.8 MG PO TABS: 64.8000 mg | ORAL_TABLET | Freq: Two times a day (BID) | ORAL | 3 refills | Status: DC

## 2016-08-22 MED ORDER — PHENYTOIN SODIUM EXTENDED 200 MG PO CAPS: 200.0000 mg | ORAL_CAPSULE | Freq: Two times a day (BID) | ORAL | 3 refills | Status: DC

## 2016-08-22 MED ORDER — ATORVASTATIN CALCIUM 20 MG PO TABS: 20.0000 mg | ORAL_TABLET | Freq: Every day | ORAL | 3 refills | Status: DC

## 2016-08-22 MED ORDER — CLOPIDOGREL BISULFATE 75 MG PO TABS: 75.0000 mg | ORAL_TABLET | Freq: Every day | ORAL | 3 refills | Status: DC

## 2016-08-22 MED ORDER — CITALOPRAM HYDROBROMIDE 10 MG PO TABS: 10.0000 mg | ORAL_TABLET | Freq: Every day | ORAL | 3 refills | Status: DC

## 2016-08-22 NOTE — Patient Instructions (Addendum)
The patient will continue to rehabilitation slowly but progressively. He understands it is important to use a cane inset of a walking stick He will continue to have assistance at home in the morning with somebody preparing his lunch and in the evening with repair in his dinner and making sure his medications are being taken correctly. He will continue to take the antidepressant until at least the spring time. This winter needs to drink plenty of fluids and stay well hydrated and keep the house as cool as possible Continue to monitor weights daily

## 2016-08-22 NOTE — Addendum Note (Signed)
Addended by: Zannie Cove on: 08/22/2016 03:07 PM   Modules accepted: Orders

## 2016-08-22 NOTE — Addendum Note (Signed)
Addended by: Zannie Cove on: 08/22/2016 03:11 PM   Modules accepted: Orders

## 2016-08-22 NOTE — Progress Notes (Signed)
Subjective:    Patient ID: Joshua [name], male    DOB: 1931-12-09, 82 y.o.   MRN: [id number]  HPI Patient here today for 6 week follow up from release from rehabilitation.This patient had a STEMI back in the summer. He ended up in the hospital for quite a long time and an extended care facility. He also has prostate cancer and bladder cancer. He is being followed regularly by the urologist. He continues to be followed by the cardiologist. Until recently he had 24-hour daycare at home and now he is driving and he has someone checking on him every night. His son is in the community and checks on him daily also. He is had a long road but is doing much better. He continues to need to be careful with walking so that he does not fall and moves slowly. The patient has recently seen Dr. Daneen Schick and will not go back to see him again fourth 3 months. He is also followed up with the urologist and had a cystoscopy. Everything seems to be under good control and moving forward. The patient denies any chest pain or shortness of breath anymore than expected. He denies any trouble with heartburn indigestion nausea vomiting diarrhea or blood in the stool. He does have some bruising is not sure the bruising came from. He just realized last night when one of his helpers was helping him with his food that he's been taking to Plavix a day and this is been reduced back to 1. He denies any problem with passing his water.    Patient Active Problem List   Diagnosis Date Noted  . Weakness   . Aortic atherosclerosis ([place]) 06/03/2016  . General weakness   . Protein-calorie malnutrition, severe ([place])   . Failure to thrive in adult   . Bradycardia 06/02/2016  . GI bleed 06/01/2016  . Slurred speech 06/01/2016  . Pyrexia   . Leukocytosis   . 1st degree AV block   . Cardiogenic shock ([place])   . AV block, Mobitz 2 04/27/2016  . ST elevation (STEMI) myocardial infarction involving other coronary artery of inferior wall  ([place]) 04/27/2016  . STEMI (ST elevation myocardial infarction) ([place]) 04/27/2016  . Bladder cancer ([place]) 04/18/2016  . Vitamin D deficiency 10/15/2015  . Prostate cancer ([place])   . Hypercholesterolemia   . Essential hypertension   . Seizures ([place])   . GERD 08/22/2008  . COLONIC POLYPS, ADENOMATOUS, HX OF 08/22/2008   Outpatient Encounter Prescriptions as of 08/22/2016  Medication Sig  . aspirin EC 81 MG tablet Take 1 tablet (81 mg total) by mouth daily.  Marland Kitchen atorvastatin (LIPITOR) 20 MG tablet Take 1 tablet (20 mg total) by mouth daily.  . Cholecalciferol (VITAMIN D3) 1000 units CAPS Take 1 capsule by mouth daily.  . citalopram (CELEXA) 10 MG tablet Take 1 tablet (10 mg total) by mouth daily.  . clopidogrel (PLAVIX) 75 MG tablet Take 1 tablet (75 mg total) by mouth daily.  Marland Kitchen PHENobarbital (LUMINAL) 64.8 MG tablet Take 1 tablet (64.8 mg total) by mouth 2 (two) times daily.  . phenytoin (DILANTIN) 200 MG ER capsule Take 1 capsule (200 mg total) by mouth 2 (two) times daily.  . [DISCONTINUED] ergocalciferol (VITAMIN D2) 50000 units capsule Take 1 capsule (50,000 Units total) by mouth once a week.  . [DISCONTINUED] feeding supplement, ENSURE ENLIVE, (ENSURE ENLIVE) LIQD Take 237 mLs by mouth 2 (two) times daily between meals.  . [DISCONTINUED] magnesium chloride (SLOW-MAG) 64 MG  TBEC SR tablet Take 1 tablet (64 mg total) by mouth daily.  . [DISCONTINUED] magnesium hydroxide (MILK OF MAGNESIA) 400 MG/5ML suspension Take 30 mLs by mouth daily as needed for mild constipation.  . [DISCONTINUED] polyethylene glycol (MIRALAX / GLYCOLAX) packet Take 17 g by mouth daily as needed for mild constipation or moderate constipation.  . [DISCONTINUED] predniSONE (DELTASONE) 20 MG tablet Take 20 mg by mouth daily with breakfast.  . [DISCONTINUED] ranitidine (ZANTAC) 300 MG tablet Take 300 mg by mouth daily.   No facility-administered encounter medications on file as of 08/22/2016.       Review of Systems    Constitutional: Negative.   HENT: Negative.   Eyes: Negative.   Respiratory: Negative.   Cardiovascular: Negative.   Gastrointestinal: Negative.   Endocrine: Negative.   Genitourinary: Negative.   Musculoskeletal: Negative.   Skin: Negative.   Allergic/Immunologic: Negative.   Neurological: Negative.   Hematological: Negative.   Psychiatric/Behavioral: Negative.        Objective:   Physical Exam  Constitutional: He is oriented to person, place, and time. He appears well-developed and well-nourished. No distress.  The patient is improving slowly and has a more positive attitude. He came to the office today by himself driving his own car.  HENT:  Head: Normocephalic and atraumatic.  Right Ear: External ear normal.  Left Ear: External ear normal.  Nose: Nose normal.  Mouth/Throat: Oropharynx is clear and moist. No oropharyngeal exudate.  Eyes: Conjunctivae and EOM are normal. Pupils are equal, round, and reactive to light. Right eye exhibits no discharge. Left eye exhibits no discharge. No scleral icterus.  Neck: Normal range of motion. Neck supple. No thyromegaly present.  Cardiovascular: Normal rate, regular rhythm and normal heart sounds.   No murmur heard. Heart has a regular rate and rhythm at 72/m  Pulmonary/Chest: Effort normal and breath sounds normal. No respiratory distress. He has no wheezes. He has no rales. He exhibits no tenderness.  Abdominal: Soft. Bowel sounds are normal. He exhibits no mass. There is no tenderness. There is no rebound and no guarding.  Genitourinary: Rectum normal, prostate normal and penis normal.  Musculoskeletal: Normal range of motion. He exhibits edema (slight pretibial edema right greater than left). He exhibits no tenderness.  Lymphadenopathy:    He has no cervical adenopathy.  Neurological: He is alert and oriented to person, place, and time.  The patient is using a walking stick for stability and I told him he should changed to a cane  until his walking ability becomes more stable.  Skin: Skin is warm and dry. No rash noted.  Dry skin with increased bruising on the right abdomen apparently from where he has hit himself somehow recently which she does not recall.  Psychiatric: He has a normal mood and affect. His behavior is normal. Judgment and thought content normal.  Nursing note and vitals reviewed.  BP (!) 109/53 (BP Location: Left Arm)   Pulse 74   Temp 97.6 F (36.4 C) (Oral)   Ht 6\' 1"  (1.854 m)   Wt 183 lb (83 kg)   BMI 24.14 kg/m         Assessment & Plan:  1. Essential hypertension -The blood pressure is good today he will continue with current treatment  2. Hypercholesterolemia -Continue with current treatment  3. Vitamin D deficiency -Continue with current treatment  4. Basal cell carcinoma of right upper arm -Follow-up with dermatology as needed  5. ASCVD (arteriosclerotic cardiovascular disease) -Continue to follow-up  with cardiology as planned  6. Seizure disorder ([place]) -Continue Dilantin as currently doing and we will check a Dilantin level as he became Dilantin toxic sometime back in the summer when all his medical conditions deteriorated.  7. Gait instability -Use a cane instead of a walking stick and summer after the new year we will arrange for him to have a visit with rehabilitation [place] to start working with improving his gait stability and strengthening.  No orders of the defined types were placed in this encounter.  Patient Instructions  The patient will continue to rehabilitation slowly but progressively. He understands it is important to use a cane inset of a walking stick He will continue to have assistance at home in the morning with somebody preparing his lunch and in the evening with repair in his dinner and making sure his medications are being taken correctly. He will continue to take the antidepressant until at least the spring time. This winter needs to drink  plenty of fluids and stay well hydrated and keep the house as cool as possible Continue to monitor weights daily  Arrie Senate MD

## 2016-08-23 DIAGNOSIS — Z9181 History of falling: Secondary | ICD-10-CM | POA: Diagnosis not present

## 2016-08-23 DIAGNOSIS — E43 Unspecified severe protein-calorie malnutrition: Secondary | ICD-10-CM | POA: Diagnosis not present

## 2016-08-23 DIAGNOSIS — M6281 Muscle weakness (generalized): Secondary | ICD-10-CM | POA: Diagnosis not present

## 2016-08-23 DIAGNOSIS — I509 Heart failure, unspecified: Secondary | ICD-10-CM | POA: Diagnosis not present

## 2016-08-23 DIAGNOSIS — I252 Old myocardial infarction: Secondary | ICD-10-CM | POA: Diagnosis not present

## 2016-08-23 DIAGNOSIS — R001 Bradycardia, unspecified: Secondary | ICD-10-CM | POA: Diagnosis not present

## 2016-08-23 LAB — BMP8+EGFR
BUN/Creatinine Ratio: 21 (ref 10–24)
BUN: 17 mg/dL (ref 8–27)
CALCIUM: 9.1 mg/dL (ref 8.6–10.2)
CHLORIDE: 104 mmol/L (ref 96–106)
CO2: 24 mmol/L (ref 18–29)
CREATININE: 0.81 mg/dL (ref 0.76–1.27)
GFR calc Af Amer: 96 mL/min/{1.73_m2} (ref >59)
GFR calc non Af Amer: 83 mL/min/{1.73_m2} (ref >59)
GLUCOSE: 92 mg/dL (ref 65–99)
Potassium: 4.8 mmol/L (ref 3.5–5.2)
Sodium: 142 mmol/L (ref 134–144)

## 2016-08-23 LAB — PHENYTOIN LEVEL, TOTAL: Phenytoin (Dilantin), Serum: 11.4 ug/mL (ref 10.0–20.0)

## 2016-08-23 LAB — PHENOBARBITAL LEVEL: PHENOBARBITAL, SERUM: 25 ug/mL (ref 15–40)

## 2016-08-25 DIAGNOSIS — E43 Unspecified severe protein-calorie malnutrition: Secondary | ICD-10-CM | POA: Diagnosis not present

## 2016-08-25 DIAGNOSIS — I252 Old myocardial infarction: Secondary | ICD-10-CM | POA: Diagnosis not present

## 2016-08-25 DIAGNOSIS — I509 Heart failure, unspecified: Secondary | ICD-10-CM | POA: Diagnosis not present

## 2016-08-25 DIAGNOSIS — R001 Bradycardia, unspecified: Secondary | ICD-10-CM | POA: Diagnosis not present

## 2016-08-25 DIAGNOSIS — M6281 Muscle weakness (generalized): Secondary | ICD-10-CM | POA: Diagnosis not present

## 2016-08-25 DIAGNOSIS — Z9181 History of falling: Secondary | ICD-10-CM | POA: Diagnosis not present

## 2016-08-29 DIAGNOSIS — M6281 Muscle weakness (generalized): Secondary | ICD-10-CM | POA: Diagnosis not present

## 2016-08-29 DIAGNOSIS — I509 Heart failure, unspecified: Secondary | ICD-10-CM | POA: Diagnosis not present

## 2016-08-29 DIAGNOSIS — E43 Unspecified severe protein-calorie malnutrition: Secondary | ICD-10-CM | POA: Diagnosis not present

## 2016-08-29 DIAGNOSIS — Z9181 History of falling: Secondary | ICD-10-CM | POA: Diagnosis not present

## 2016-08-29 DIAGNOSIS — I252 Old myocardial infarction: Secondary | ICD-10-CM | POA: Diagnosis not present

## 2016-08-29 DIAGNOSIS — R001 Bradycardia, unspecified: Secondary | ICD-10-CM | POA: Diagnosis not present

## 2016-08-31 DIAGNOSIS — I509 Heart failure, unspecified: Secondary | ICD-10-CM | POA: Diagnosis not present

## 2016-08-31 DIAGNOSIS — Z9181 History of falling: Secondary | ICD-10-CM | POA: Diagnosis not present

## 2016-08-31 DIAGNOSIS — I252 Old myocardial infarction: Secondary | ICD-10-CM | POA: Diagnosis not present

## 2016-08-31 DIAGNOSIS — E43 Unspecified severe protein-calorie malnutrition: Secondary | ICD-10-CM | POA: Diagnosis not present

## 2016-08-31 DIAGNOSIS — R001 Bradycardia, unspecified: Secondary | ICD-10-CM | POA: Diagnosis not present

## 2016-08-31 DIAGNOSIS — M6281 Muscle weakness (generalized): Secondary | ICD-10-CM | POA: Diagnosis not present

## 2016-09-02 ENCOUNTER — Other Ambulatory Visit: Payer: Self-pay | Admitting: *Deleted

## 2016-09-02 DIAGNOSIS — R2681 Unsteadiness on feet: Secondary | ICD-10-CM

## 2016-09-02 DIAGNOSIS — R531 Weakness: Secondary | ICD-10-CM

## 2016-09-02 DIAGNOSIS — R29898 Other symptoms and signs involving the musculoskeletal system: Secondary | ICD-10-CM

## 2016-09-05 DIAGNOSIS — E43 Unspecified severe protein-calorie malnutrition: Secondary | ICD-10-CM | POA: Diagnosis not present

## 2016-09-05 DIAGNOSIS — Z9181 History of falling: Secondary | ICD-10-CM | POA: Diagnosis not present

## 2016-09-05 DIAGNOSIS — I252 Old myocardial infarction: Secondary | ICD-10-CM | POA: Diagnosis not present

## 2016-09-05 DIAGNOSIS — M6281 Muscle weakness (generalized): Secondary | ICD-10-CM | POA: Diagnosis not present

## 2016-09-05 DIAGNOSIS — I509 Heart failure, unspecified: Secondary | ICD-10-CM | POA: Diagnosis not present

## 2016-09-05 DIAGNOSIS — R001 Bradycardia, unspecified: Secondary | ICD-10-CM | POA: Diagnosis not present

## 2016-09-06 NOTE — Telephone Encounter (Signed)
Patient was seen since phone call

## 2016-09-08 ENCOUNTER — Encounter: Payer: Self-pay | Admitting: *Deleted

## 2016-09-08 DIAGNOSIS — Z9181 History of falling: Secondary | ICD-10-CM | POA: Diagnosis not present

## 2016-09-08 DIAGNOSIS — R001 Bradycardia, unspecified: Secondary | ICD-10-CM | POA: Diagnosis not present

## 2016-09-08 DIAGNOSIS — M6281 Muscle weakness (generalized): Secondary | ICD-10-CM | POA: Diagnosis not present

## 2016-09-08 DIAGNOSIS — I252 Old myocardial infarction: Secondary | ICD-10-CM | POA: Diagnosis not present

## 2016-09-08 DIAGNOSIS — I509 Heart failure, unspecified: Secondary | ICD-10-CM | POA: Diagnosis not present

## 2016-09-08 DIAGNOSIS — E43 Unspecified severe protein-calorie malnutrition: Secondary | ICD-10-CM | POA: Diagnosis not present

## 2016-09-13 DIAGNOSIS — I252 Old myocardial infarction: Secondary | ICD-10-CM | POA: Diagnosis not present

## 2016-09-13 DIAGNOSIS — M6281 Muscle weakness (generalized): Secondary | ICD-10-CM | POA: Diagnosis not present

## 2016-09-13 DIAGNOSIS — E43 Unspecified severe protein-calorie malnutrition: Secondary | ICD-10-CM | POA: Diagnosis not present

## 2016-09-13 DIAGNOSIS — I509 Heart failure, unspecified: Secondary | ICD-10-CM | POA: Diagnosis not present

## 2016-09-13 DIAGNOSIS — Z9181 History of falling: Secondary | ICD-10-CM | POA: Diagnosis not present

## 2016-09-13 DIAGNOSIS — R001 Bradycardia, unspecified: Secondary | ICD-10-CM | POA: Diagnosis not present

## 2016-09-14 ENCOUNTER — Other Ambulatory Visit: Payer: Self-pay

## 2016-09-14 DIAGNOSIS — M6281 Muscle weakness (generalized): Secondary | ICD-10-CM | POA: Diagnosis not present

## 2016-09-14 DIAGNOSIS — I252 Old myocardial infarction: Secondary | ICD-10-CM | POA: Diagnosis not present

## 2016-09-14 DIAGNOSIS — I2119 ST elevation (STEMI) myocardial infarction involving other coronary artery of inferior wall: Secondary | ICD-10-CM

## 2016-09-14 DIAGNOSIS — R001 Bradycardia, unspecified: Secondary | ICD-10-CM | POA: Diagnosis not present

## 2016-09-14 DIAGNOSIS — Z9181 History of falling: Secondary | ICD-10-CM | POA: Diagnosis not present

## 2016-09-14 DIAGNOSIS — E43 Unspecified severe protein-calorie malnutrition: Secondary | ICD-10-CM | POA: Diagnosis not present

## 2016-09-14 DIAGNOSIS — I509 Heart failure, unspecified: Secondary | ICD-10-CM | POA: Diagnosis not present

## 2016-09-15 ENCOUNTER — Telehealth: Payer: Self-pay | Admitting: Family Medicine

## 2016-09-15 ENCOUNTER — Other Ambulatory Visit: Payer: Self-pay | Admitting: *Deleted

## 2016-09-15 MED ORDER — PHENYTOIN SODIUM EXTENDED 100 MG PO CAPS: ORAL_CAPSULE | ORAL | 0 refills | Status: DC

## 2016-09-15 MED ORDER — CITALOPRAM HYDROBROMIDE 20 MG PO TABS: 20.0000 mg | ORAL_TABLET | Freq: Every day | ORAL | 1 refills | Status: DC

## 2016-09-15 MED ORDER — PHENYTOIN SODIUM EXTENDED 100 MG PO CAPS: ORAL_CAPSULE | ORAL | 3 refills | Status: DC

## 2016-09-15 NOTE — Telephone Encounter (Signed)
lmtcb

## 2016-09-20 ENCOUNTER — Encounter: Payer: Self-pay | Admitting: Physical Therapy

## 2016-09-20 ENCOUNTER — Ambulatory Visit: Payer: Medicare Other | Attending: Family Medicine | Admitting: Physical Therapy

## 2016-09-20 DIAGNOSIS — M6281 Muscle weakness (generalized): Secondary | ICD-10-CM

## 2016-09-20 DIAGNOSIS — R2681 Unsteadiness on feet: Secondary | ICD-10-CM | POA: Diagnosis not present

## 2016-09-20 NOTE — Therapy (Signed)
[facility] [place], [place], [address] Phone: [phone]   Fax:  [phone]  Physical Therapy Evaluation  Patient Details  Name: Joshua [name] MRN: [id number] Date of Birth: 1932/06/03 Referring Provider: Redge Gainer MD  Encounter Date: 09/20/2016      PT End of Session - 09/20/16 1753    Visit Number 1   Number of Visits 16   Date for PT Re-Evaluation 11/19/16   PT Start Time 0237   PT Stop Time 0314   PT Time Calculation (min) 37 min   Activity Tolerance Patient tolerated treatment well   Behavior During Therapy [facility] for tasks assessed/performed      Past Medical History:  Diagnosis Date  . Allergy    pcn  . Arteriovenous malformation    ([place])  . Blood transfusion    right eye  no surgery as yet  . Bradycardia   . CAD (coronary artery disease)    a. 04/27/2016 PCI with DES to RCA with 50% ostial to 60% segmental mid to distal left main, and 90% thrombus filled ostial to proximal circumflex, with distal right coronary filled by collaterals from left-to-right  . Constipation   . ED (erectile dysfunction)   . GERD (gastroesophageal reflux disease)   . Hypercholesterolemia   . Hypertension   . Muscle weakness   . Prostate cancer ([place]) 11/02/11 bxs   Adenocarcinoma,Gleason=3+3=6 & 3+4=7,PSA+1.58 Volume=19cc  . Prostate cancer ([place])   . Seizures ([place])   . STEMI (ST elevation myocardial infarction) ([place])   . Vitamin D deficiency     Past Surgical History:  Procedure Laterality Date  . ABDOMINAL SURGERY    . APPENDECTOMY    . CARDIAC CATHETERIZATION N/A 04/27/2016   Procedure: Left Heart Cath and Coronary Angiography;  Surgeon: Belva Crome, MD;  Location: [facility];  Service: Cardiovascular;  Laterality: N/A;  . CARDIAC CATHETERIZATION N/A 04/27/2016   Procedure: Temporary Pacemaker;  Surgeon: Belva Crome, MD;  Location: [facility];  Service: Cardiovascular;  Laterality: N/A;  . CARDIAC  CATHETERIZATION N/A 04/27/2016   Procedure: Coronary Stent Intervention;  Surgeon: Belva Crome, MD;  Location: [facility];  Service: Cardiovascular;  Laterality: N/A;  Proximal RCA Mid RCA  . CORONARY STENT PLACEMENT    . CRANIOTOMY  1980  . CYSTOSCOPY  01/19/2012   Procedure: CYSTOSCOPY;  Surgeon: Malka So, MD;  Location: [facility];  Service: Urology;;  no seeds found in bladder  . CYSTOSCOPY W/ RETROGRADES Bilateral 04/19/2016   Procedure: CYSTOSCOPY WITH BILATERAL RETROGRADE PYELOGRAM TRANSURETHRAL RESECTION OF BLADDER TUMOR ;  Surgeon: [name], MD;  Location: [facility];  Service: Urology;  Laterality: Bilateral;  . RADIOACTIVE SEED IMPLANT  01/19/2012   Procedure: RADIOACTIVE SEED IMPLANT;  Surgeon: Malka So, MD;  Location: [facility];  Service: Urology;  Laterality: N/A;  68 seeds implanted    There were no vitals filed for this visit.       Subjective Assessment - 09/20/16 1717    Subjective The patient presents to OPPT with c/o gait instability.  He had an MI in August of 2017.  He had a hospital admission and then went to a long term care facility.  He began to lose a lot of weight and was having difficulty walking.  This was thought to be related to medication.  He was readmitted to the hospital and following discharge stayed 60 days in a  long term care facility.  He states he then was able to go home and there he received home health.  He presents to the clinic using a staff.     Pertinent History Focal seizures many years ago affecting his right LE.   Patient Stated Goals Walk better and not lose balance.            [facility] PT Assessment - 09/20/16 0001      Assessment   Medical Diagnosis Gait instability.   Referring Provider Redge Gainer MD   Onset Date/Surgical Date --  August 2017.     Precautions   Precautions Fall     Restrictions   Weight Bearing Restrictions No     Balance Screen   Has the patient fallen in the  past 6 months Yes   How many times? --  3.   Has the patient had a decrease in activity level because of a fear of falling?  Yes   Is the patient reluctant to leave their home because of a fear of falling?  Yes     [place] residence     Prior Function   Level of Independence Independent     Posture/Postural Control   Posture/Postural Control Postural limitations   Postural Limitations Rounded Shoulders;Forward head;Decreased lumbar lordosis;Flexed trunk     [name] / Strength   AROM / PROM / Strength AROM;Strength     AROM   Overall AROM Comments WFL for bilateral LE.  Right ankle dorsiflexion to neutral.     Strength   Overall Strength Comments Right hip= 4-/5; right knee= 4 to 4+/5 and right ankle= 3-/5.     Ambulation/Gait   Gait Pattern Decreased step length - left;Decreased stance time - right;Decreased stance time - left;Decreased stride length;Decreased dorsiflexion - right;Shuffle;Scissoring;Trunk flexed;Poor foot clearance - right   Gait Comments See above.  Patient is using a staff to ambulate.  The patient even hit his right heel on his left foot several times.     Standardized Balance Assessment   Standardized Balance Assessment Berg Balance Test     Berg Balance Test   Sit to Stand Able to stand  independently using hands   Standing Unsupported Able to stand 2 minutes with supervision   Sitting with Back Unsupported but Feet Supported on Floor or Stool Able to sit safely and securely 2 minutes   Stand to Sit Sits safely with minimal use of hands   Transfers Able to transfer safely, definite need of hands   Standing Unsupported with Eyes Closed Able to stand 10 seconds with supervision   Standing Ubsupported with Feet Together Able to place feet together independently but unable to hold for 30 seconds   From Standing, Reach Forward with Outstretched Arm Can reach confidently >25 cm (10")   From Standing Position, Pick up Object  from Floor Able to pick up shoe, needs supervision   From Standing Position, Turn to Look Behind Over each Shoulder Looks behind from both sides and weight shifts well   Turn 360 Degrees Able to turn 360 degrees safely one side only in 4 seconds or less   Standing Unsupported, Alternately Place Feet on Step/Stool Able to complete 4 steps without aid or supervision   Standing Unsupported, One Foot in Front Able to take small step independently and hold 30 seconds   Standing on One Leg Able to lift leg independently and hold equal to or more than 3 seconds  Total Score 42   Berg comment: Romberg test is negative.                   [facility] Adult PT Treatment/Exercise - 09/20/16 0001      Exercises   Exercises Knee/Hip     Knee/Hip Exercises: Aerobic   Nustep Level 3 x 10 minutes with HR= 90 bpm and 02 sat= 94%.                  PT Short Term Goals - 09/20/16 1752      PT SHORT TERM GOAL #1   Title Berg score= 46/56.   Time 4   Period Weeks   Status New           PT Long Term Goals - 09/20/16 1752      PT LONG TERM GOAL #1   Title Berg score= 49-50/56.   Time 8   Period Weeks   Status New     PT LONG TERM GOAL #2   Title Independent with a HEP.   Time 8   Period Weeks   Status New               Plan - 09/20/16 1748    Clinical Impression Statement The patient's Merrilee Jansky score is a 42/56.  He is using a staff for ambulation.  He has fallen 3 times since August of 2017.  His right LE is weak compared to left due to focal seizures many years ago.  He has an order for an AFO and is supposed to call a medical supplier for this.   Rehab Potential Good   PT Frequency 2x / week   PT Duration 8 weeks   PT Treatment/Interventions ADLs/Self Care Home Management;Gait training;Functional mobility training;Therapeutic activities;Therapeutic exercise;Balance training;Neuromuscular re-education;Patient/family education   PT Next Visit Plan Balance program and  LE strengthening.      Patient will benefit from skilled therapeutic intervention in order to improve the following deficits and impairments:  Abnormal gait, Decreased activity tolerance, Decreased balance, Decreased coordination, Difficulty walking  Visit Diagnosis: Unsteadiness on feet - Plan: PT plan of care cert/re-cert  Muscle weakness (generalized) - Plan: PT plan of care cert/re-cert      G-Codes - [id number]    Functional Assessment Tool Used Clinical judgement...   Functional Limitation Mobility: Walking and moving around   Mobility: Walking and Moving Around Current Status [phone]) At least 40 percent but less than 60 percent impaired, limited or restricted   Mobility: Walking and Moving Around Goal Status [id number]) At least 1 percent but less than 20 percent impaired, limited or restricted       Problem List Patient Active Problem List   Diagnosis Date Noted  . Weakness   . Aortic atherosclerosis ([place]) 06/03/2016  . General weakness   . Protein-calorie malnutrition, severe ([place])   . Failure to thrive in adult   . Bradycardia 06/02/2016  . GI bleed 06/01/2016  . Slurred speech 06/01/2016  . Pyrexia   . Leukocytosis   . 1st degree AV block   . Cardiogenic shock ([place])   . AV block, Mobitz 2 04/27/2016  . ST elevation (STEMI) myocardial infarction involving other coronary artery of inferior wall ([place]) 04/27/2016  . STEMI (ST elevation myocardial infarction) ([place]) 04/27/2016  . Bladder cancer ([place]) 04/18/2016  . Vitamin D deficiency 10/15/2015  . Prostate cancer ([place])   . Hypercholesterolemia   . Essential hypertension   . Seizures ([place])   .  GERD 08/22/2008  . COLONIC POLYPS, ADENOMATOUS, HX OF 08/22/2008    [place], [place] MPT 09/20/2016, 5:59 PM  [facility] [address] [place], [place], [address] Phone: [phone]   Fax:  [phone]  Name: Joshua [name] MRN: [id number] Date of Birth: 10/13/1931

## 2016-09-21 ENCOUNTER — Other Ambulatory Visit: Payer: Self-pay | Admitting: *Deleted

## 2016-09-21 DIAGNOSIS — L905 Scar conditions and fibrosis of skin: Secondary | ICD-10-CM | POA: Diagnosis not present

## 2016-09-22 ENCOUNTER — Ambulatory Visit: Payer: Medicare Other | Admitting: *Deleted

## 2016-09-22 DIAGNOSIS — R2681 Unsteadiness on feet: Secondary | ICD-10-CM

## 2016-09-22 DIAGNOSIS — M6281 Muscle weakness (generalized): Secondary | ICD-10-CM | POA: Diagnosis not present

## 2016-09-22 NOTE — Therapy (Signed)
[facility] [place], [place], [address] Phone: [phone]   Fax:  [phone]  Physical Therapy Treatment  Patient Details  Name: Joshua [name] MRN: [id number] Date of Birth: [birth year] Referring Provider: Redge Gainer MD  Encounter Date: 09/22/2016      PT End of Session - 09/22/16 1352    Visit Number 2   Number of Visits 16   Date for PT Re-Evaluation 11/19/16   PT Start Time [id number]   PT Stop Time 1439   PT Time Calculation (min) 50 min      Past Medical History:  Diagnosis Date  . Allergy    pcn  . Arteriovenous malformation    (CNS)  . Blood transfusion    right eye  no surgery as yet  . Bradycardia   . CAD (coronary artery disease)    a. 04/27/2016 PCI with DES to RCA with 50% ostial to 60% segmental mid to distal left main, and 90% thrombus filled ostial to proximal circumflex, with distal right coronary filled by collaterals from left-to-right  . Constipation   . ED (erectile dysfunction)   . GERD (gastroesophageal reflux disease)   . Hypercholesterolemia   . Hypertension   . Muscle weakness   . Prostate cancer ([place]) 11/02/11 bxs   Adenocarcinoma,Gleason=3+3=6 & 3+4=7,PSA+1.58 Volume=19cc  . Prostate cancer ([place])   . Seizures ([place])   . STEMI (ST elevation myocardial infarction) ([place])   . Vitamin D deficiency     Past Surgical History:  Procedure Laterality Date  . ABDOMINAL SURGERY    . APPENDECTOMY    . CARDIAC CATHETERIZATION N/A 04/27/2016   Procedure: Left Heart Cath and Coronary Angiography;  Surgeon: Belva Crome, MD;  Location: [facility];  Service: Cardiovascular;  Laterality: N/A;  . CARDIAC CATHETERIZATION N/A 04/27/2016   Procedure: Temporary Pacemaker;  Surgeon: Belva Crome, MD;  Location: [facility];  Service: Cardiovascular;  Laterality: N/A;  . CARDIAC CATHETERIZATION N/A 04/27/2016   Procedure: Coronary Stent Intervention;  Surgeon: Belva Crome, MD;  Location: [facility];  Service: Cardiovascular;  Laterality: N/A;  Proximal RCA Mid RCA  . CORONARY STENT PLACEMENT    . CRANIOTOMY  1980  . CYSTOSCOPY  01/19/2012   Procedure: CYSTOSCOPY;  Surgeon: Malka So, MD;  Location: [facility];  Service: Urology;;  no seeds found in bladder  . CYSTOSCOPY W/ RETROGRADES Bilateral 04/19/2016   Procedure: CYSTOSCOPY WITH BILATERAL RETROGRADE PYELOGRAM TRANSURETHRAL RESECTION OF BLADDER TUMOR ;  Surgeon: [name], MD;  [facility];  Service: Urology;  Laterality: Bilateral;  . RADIOACTIVE SEED IMPLANT  01/19/2012   Procedure: RADIOACTIVE SEED IMPLANT;  Surgeon: Malka So, MD;  Location: [facility];  Service: Urology;  Laterality: N/A;  68 seeds implanted    There were no vitals filed for this visit.      Subjective Assessment - 09/22/16 1352    Subjective The patient presents to OPPT with c/o gait instability.  He had an MI in August of 2017.  He had a hospital admission and then went to a long term care facility.  He began to lose a lot of weight and was having difficulty walking.  This was thought to be related to medication.  He was readmitted to the hospital and following discharge stayed 60 days in a long term care facility.  He states he then was able to go home and there he received  home health.  He presents to the clinic using a staff.     Pertinent History Focal seizures many years ago affecting his right LE.   Patient Stated Goals Walk better and not lose balance.   Currently in Pain? No/denies                         [facility] Adult PT Treatment/Exercise - 09/22/16 0001      Knee/Hip Exercises: Aerobic   Nustep Level 5 x 20 minutes     Knee/Hip Exercises: Standing   Rocker Board 5 minutes  calf stretching and balance SBA   Other Standing Knee Exercises 6 in toe taps for balance CGA, standing in tandem 2x20 UE reaches, standing on airex UE reaches 2x 20     Knee/Hip Exercises: Seated    Long Arc Quad Strengthening;Right;Left;Both;3 sets;10 reps   Long Arc Quad Weight 4 lbs.   Hamstring Curl Strengthening;Right;3 sets;10 reps   Sit to Sand 2 sets;10 reps;without UE support  high mat table                  PT Short Term Goals - 09/20/16 1752      PT SHORT TERM GOAL #1   Title Berg score= 46/56.   Time 4   Period Weeks   Status New           PT Long Term Goals - 09/20/16 1752      PT LONG TERM GOAL #1   Title Berg score= 49-50/56.   Time 8   Period Weeks   Status New     PT LONG TERM GOAL #2   Title Independent with a HEP.   Time 8   Period Weeks   Status New               Plan - 09/22/16 1421    Clinical Impression Statement The Pt did fairly well with Rx today. He was able to perform balance act.'s and LE strengthening for both legs with focus on RT. He was challenged with side to side balance more than front to back today and also wt shifting and toe taps   Rehab Potential Good   PT Frequency 2x / week   PT Duration 8 weeks   PT Treatment/Interventions ADLs/Self Care Home Management;Gait training;Functional mobility training;Therapeutic activities;Therapeutic exercise;Balance training;Neuromuscular re-education;Patient/family education   PT Next Visit Plan Balance program and LE strengthening.    Pt to call about AFO   Consulted and Agree with Plan of Care Patient      Patient will benefit from skilled therapeutic intervention in order to improve the following deficits and impairments:  Abnormal gait, Decreased activity tolerance, Decreased balance, Decreased coordination, Difficulty walking  Visit Diagnosis: Unsteadiness on feet  Muscle weakness (generalized)     Problem List Patient Active Problem List   Diagnosis Date Noted  . Weakness   . Aortic atherosclerosis ([place]) 06/03/2016  . General weakness   . Protein-calorie malnutrition, severe ([place])   . Failure to thrive in adult   . Bradycardia 06/02/2016  . GI bleed  06/01/2016  . Slurred speech 06/01/2016  . Pyrexia   . Leukocytosis   . 1st degree AV block   . Cardiogenic shock ([place])   . AV block, Mobitz 2 04/27/2016  . ST elevation (STEMI) myocardial infarction involving other coronary artery of inferior wall ([place]) 04/27/2016  . STEMI (ST elevation myocardial infarction) ([place]) 04/27/2016  . Bladder cancer ([place]) 04/18/2016  .  Vitamin D deficiency 10/15/2015  . Prostate cancer ([place])   . Hypercholesterolemia   . Essential hypertension   . Seizures ([place])   . GERD 08/22/2008  . COLONIC POLYPS, ADENOMATOUS, HX OF 08/22/2008    Talissa Apple,CHRIS, PTA 09/22/2016, 2:43 PM  [facility] [place], [place], [address] Phone: [phone]   Fax:  [phone]  Name: Joshua [name] MRN: [id number] Date of Birth: [birth year]

## 2016-09-27 ENCOUNTER — Ambulatory Visit: Payer: Medicare Other | Admitting: *Deleted

## 2016-09-27 DIAGNOSIS — R2681 Unsteadiness on feet: Secondary | ICD-10-CM | POA: Diagnosis not present

## 2016-09-27 DIAGNOSIS — M6281 Muscle weakness (generalized): Secondary | ICD-10-CM

## 2016-09-27 NOTE — Therapy (Signed)
[facility] [place], [place], [address] Phone: [phone]   Fax:  [phone]  Physical Therapy Treatment  Patient Details  Name: Joshua [name] MRN: [id number] Date of Birth: 1932/08/14 Referring Provider: Redge Gainer MD  Encounter Date: 09/27/2016      PT End of Session - 09/27/16 1353    Visit Number 3   Number of Visits 16   Date for PT Re-Evaluation 11/19/16   PT Start Time 1346   PT Stop Time 1438   PT Time Calculation (min) 52 min   Activity Tolerance Patient tolerated treatment well   Behavior During Therapy [facility] for tasks assessed/performed      Past Medical History:  Diagnosis Date  . Allergy    pcn  . Arteriovenous malformation    ([place])  . Blood transfusion    right eye  no surgery as yet  . Bradycardia   . CAD (coronary artery disease)    a. 04/27/2016 PCI with DES to RCA with 50% ostial to 60% segmental mid to distal left main, and 90% thrombus filled ostial to proximal circumflex, with distal right coronary filled by collaterals from left-to-right  . Constipation   . ED (erectile dysfunction)   . GERD (gastroesophageal reflux disease)   . Hypercholesterolemia   . Hypertension   . Muscle weakness   . Prostate cancer ([place]) 11/02/11 bxs   Adenocarcinoma,Gleason=3+3=6 & 3+4=7,PSA+1.58 Volume=19cc  . Prostate cancer ([place])   . Seizures ([place])   . STEMI (ST elevation myocardial infarction) ([place])   . Vitamin D deficiency     Past Surgical History:  Procedure Laterality Date  . ABDOMINAL SURGERY    . APPENDECTOMY    . CARDIAC CATHETERIZATION N/A 04/27/2016   Procedure: Left Heart Cath and Coronary Angiography;  Surgeon: Belva Crome, MD;  Location: [facility];  Service: Cardiovascular;  Laterality: N/A;  . CARDIAC CATHETERIZATION N/A 04/27/2016   Procedure: Temporary Pacemaker;  Surgeon: Belva Crome, MD;  Location: [facility];  Service: Cardiovascular;  Laterality: N/A;  . CARDIAC  CATHETERIZATION N/A 04/27/2016   Procedure: Coronary Stent Intervention;  Surgeon: Belva Crome, MD;  Location: [facility];  Service: Cardiovascular;  Laterality: N/A;  Proximal RCA Mid RCA  . CORONARY STENT PLACEMENT    . CRANIOTOMY  1980  . CYSTOSCOPY  01/19/2012   Procedure: CYSTOSCOPY;  Surgeon: Malka So, MD;  Location: [facility];  Service: Urology;;  no seeds found in bladder  . CYSTOSCOPY W/ RETROGRADES Bilateral 04/19/2016   Procedure: CYSTOSCOPY WITH BILATERAL RETROGRADE PYELOGRAM TRANSURETHRAL RESECTION OF BLADDER TUMOR ;  Surgeon: [name], MD;  [facility];  Service: Urology;  Laterality: Bilateral;  . RADIOACTIVE SEED IMPLANT  01/19/2012   Procedure: RADIOACTIVE SEED IMPLANT;  Surgeon: Malka So, MD;  Location: [facility];  Service: Urology;  Laterality: N/A;  68 seeds implanted    There were no vitals filed for this visit.      Subjective Assessment - 09/27/16 1352    Subjective The patient presents to OPPT with c/o gait instability.  He had an MI in August of 2017.  He had a hospital admission and then went to a long term care facility.  He began to lose a lot of weight and was having difficulty walking.  This was thought to be related to medication.  He was readmitted to the hospital and following discharge stayed 60 days in a long  term care facility.  He states he then was able to go home and there he received home health.  He presents to the clinic using a staff.     Pertinent History Focal seizures many years ago affecting his right LE.   Patient Stated Goals Walk better and not lose balance.                         [facility] Adult PT Treatment/Exercise - 09/27/16 0001      Exercises   Exercises Knee/Hip     Knee/Hip Exercises: Aerobic   Nustep Level 5 x 20 minutes     Knee/Hip Exercises: Machines for Strengthening   Cybex Knee Extension 10# 3x 10RT LE     Knee/Hip Exercises: Standing   Rocker Board 5  minutes  calf stretching and balance SBA   Other Standing Knee Exercises 6 in toe taps for balance CGA, standing in tandem 2x20 UE reaches, standing on airex UE reaches 2x 20     Knee/Hip Exercises: Seated   Hamstring Curl Strengthening;Right;3 sets;10 reps  yellow                  PT Short Term Goals - 09/20/16 1752      PT SHORT TERM GOAL #1   Title Berg score= 46/56.   Time 4   Period Weeks   Status New           PT Long Term Goals - 09/20/16 1752      PT LONG TERM GOAL #1   Title Berg score= 49-50/56.   Time 8   Period Weeks   Status New     PT LONG TERM GOAL #2   Title Independent with a HEP.   Time 8   Period Weeks   Status New               Plan - 09/27/16 1453    Clinical Impression Statement Pt was able to perform balance and LE strengthening exs  with minimal complaints today. We added knee extension machine for quad strengthening and pt was still able to isolate RT LE as well as perform eccentrics. He was challenged with side to side / front to back act.'s and needed SBA and CGA at times.    Rehab Potential Good   PT Frequency 2x / week   PT Duration 8 weeks   PT Treatment/Interventions ADLs/Self Care Home Management;Gait training;Functional mobility training;Therapeutic activities;Therapeutic exercise;Balance training;Neuromuscular re-education;Patient/family education   PT Next Visit Plan Balance program and LE strengthening.    Pt to call about AFO   Consulted and Agree with Plan of Care Patient      Patient will benefit from skilled therapeutic intervention in order to improve the following deficits and impairments:  Abnormal gait, Decreased activity tolerance, Decreased balance, Decreased coordination, Difficulty walking  Visit Diagnosis: Unsteadiness on feet  Muscle weakness (generalized)     Problem List Patient Active Problem List   Diagnosis Date Noted  . Weakness   . Aortic atherosclerosis ([place]) 06/03/2016  .  General weakness   . Protein-calorie malnutrition, severe ([place])   . Failure to thrive in adult   . Bradycardia 06/02/2016  . GI bleed 06/01/2016  . Slurred speech 06/01/2016  . Pyrexia   . Leukocytosis   . 1st degree AV block   . Cardiogenic shock ([place])   . AV block, Mobitz 2 04/27/2016  . ST elevation (STEMI) myocardial infarction involving other coronary  artery of inferior wall ([place]) 04/27/2016  . STEMI (ST elevation myocardial infarction) ([place]) 04/27/2016  . Bladder cancer ([place]) 04/18/2016  . Vitamin D deficiency 10/15/2015  . Prostate cancer ([place])   . Hypercholesterolemia   . Essential hypertension   . Seizures ([place])   . GERD 08/22/2008  . COLONIC POLYPS, ADENOMATOUS, HX OF 08/22/2008    Zaid Tomes,CHRIS, PTA 09/27/2016, 3:11 PM  [facility] [place], [place], [address] Phone: [phone]   Fax:  [phone]  Name: Joshua [name] MRN: [id number] Date of Birth: [birth year]

## 2016-09-29 ENCOUNTER — Ambulatory Visit: Payer: Medicare Other | Admitting: *Deleted

## 2016-09-29 DIAGNOSIS — M6281 Muscle weakness (generalized): Secondary | ICD-10-CM

## 2016-09-29 DIAGNOSIS — R2681 Unsteadiness on feet: Secondary | ICD-10-CM

## 2016-09-29 NOTE — Therapy (Signed)
[facility] [place], [place], [address] Phone: [phone]   Fax:  [phone]  Physical Therapy Treatment  Patient Details  Name: Joshua [name] MRN: [id number] Date of Birth: 1932-04-16 Referring Provider: Redge Gainer MD  Encounter Date: 09/29/2016      PT End of Session - 09/29/16 1413    Visit Number 4   Number of Visits 16   Date for PT Re-Evaluation 11/19/16   PT Start Time [id number]   PT Stop Time 1438   PT Time Calculation (min) 50 min      Past Medical History:  Diagnosis Date  . Allergy    pcn  . Arteriovenous malformation    ([place])  . Blood transfusion    right eye  no surgery as yet  . Bradycardia   . CAD (coronary artery disease)    a. 04/27/2016 PCI with DES to RCA with 50% ostial to 60% segmental mid to distal left main, and 90% thrombus filled ostial to proximal circumflex, with distal right coronary filled by collaterals from left-to-right  . Constipation   . ED (erectile dysfunction)   . GERD (gastroesophageal reflux disease)   . Hypercholesterolemia   . Hypertension   . Muscle weakness   . Prostate cancer ([place]) 11/02/11 bxs   Adenocarcinoma,Gleason=3+3=6 & 3+4=7,PSA+1.58 Volume=19cc  . Prostate cancer ([place])   . Seizures ([place])   . STEMI (ST elevation myocardial infarction) ([place])   . Vitamin D deficiency     Past Surgical History:  Procedure Laterality Date  . ABDOMINAL SURGERY    . APPENDECTOMY    . CARDIAC CATHETERIZATION N/A 04/27/2016   Procedure: Left Heart Cath and Coronary Angiography;  Surgeon: Belva Crome, MD;  Location: [place] [facility];  Service: Cardiovascular;  Laterality: N/A;  . CARDIAC CATHETERIZATION N/A 04/27/2016   Procedure: Temporary Pacemaker;  Surgeon: Belva Crome, MD;  Location: [facility];  Service: Cardiovascular;  Laterality: N/A;  . CARDIAC CATHETERIZATION N/A 04/27/2016   Procedure: Coronary Stent Intervention;  Surgeon: Belva Crome, MD;  Location: [facility];  Service: Cardiovascular;  Laterality: N/A;  Proximal RCA Mid RCA  . CORONARY STENT PLACEMENT    . CRANIOTOMY  1980  . CYSTOSCOPY  01/19/2012   Procedure: CYSTOSCOPY;  Surgeon: Malka So, MD;  Location: [facility];  Service: Urology;;  no seeds found in bladder  . CYSTOSCOPY W/ RETROGRADES Bilateral 04/19/2016   Procedure: CYSTOSCOPY WITH BILATERAL RETROGRADE PYELOGRAM TRANSURETHRAL RESECTION OF BLADDER TUMOR ;  Surgeon: [name], MD;  [facility];  Service: Urology;  Laterality: Bilateral;  . RADIOACTIVE SEED IMPLANT  01/19/2012   Procedure: RADIOACTIVE SEED IMPLANT;  Surgeon: Malka So, MD;  Location: [facility];  Service: Urology;  Laterality: N/A;  68 seeds implanted    There were no vitals filed for this visit.      Subjective Assessment - 09/29/16 1410    Subjective Did ok after last Rx. Feel fairly good today   Pertinent History Focal seizures many years ago affecting his right LE.   Patient Stated Goals Walk better and not lose balance.   Currently in Pain? No/denies            [facility] PT Assessment - 09/29/16 0001      Berg Balance Test   Sit to Stand Able to stand  independently using hands   Standing Unsupported Able to stand 2 minutes with supervision  Sitting with Back Unsupported but Feet Supported on Floor or Stool Able to sit safely and securely 2 minutes   Stand to Sit Sits safely with minimal use of hands   Transfers Able to transfer safely, definite need of hands   Standing Unsupported with Eyes Closed Able to stand 10 seconds safely   Standing Ubsupported with Feet Together Able to place feet together independently and stand for 1 minute with supervision   From Standing, Reach Forward with Outstretched Arm Can reach confidently >25 cm (10")   From Standing Position, Pick up Object from [place] to pick up shoe safely and easily   From Standing Position, Turn to Look Behind Over each Shoulder Looks  behind from both sides and weight shifts well   Turn 360 Degrees Able to turn 360 degrees safely one side only in 4 seconds or less   Standing Unsupported, Alternately Place Feet on Step/Stool Able to stand independently and complete 8 steps >20 seconds   Standing Unsupported, One Foot in Front Able to take small step independently and hold 30 seconds   Standing on One Leg Able to lift leg independently and hold equal to or more than 3 seconds   Total Score 46                     [facility] Adult PT Treatment/Exercise - 09/29/16 0001      Exercises   Exercises Knee/Hip     Knee/Hip Exercises: Aerobic   Nustep Level 5 x 20 minutes     Knee/Hip Exercises: Machines for Strengthening   Cybex Knee Extension 10# 3x 10RT LE     Knee/Hip Exercises: Standing   Rocker Board 5 minutes  calf stretching and balance SBA   Other Standing Knee Exercises Berg Test     Knee/Hip Exercises: Seated   Hamstring Curl Strengthening;Right;3 sets;10 reps  yellow                  PT Short Term Goals - 09/20/16 1752      PT SHORT TERM GOAL #1   Title Berg score= 46/56.   Time 4   Period Weeks   Status New           PT Long Term Goals - 09/20/16 1752      PT LONG TERM GOAL #1   Title Berg score= 49-50/56.   Time 8   Period Weeks   Status New     PT LONG TERM GOAL #2   Title Independent with a HEP.   Time 8   Period Weeks   Status New               Plan - 09/29/16 1413    Rehab Potential Good   PT Frequency 2x / week   PT Duration 8 weeks   PT Treatment/Interventions ADLs/Self Care Home Management;Gait training;Functional mobility training;Therapeutic activities;Therapeutic exercise;Balance training;Neuromuscular re-education;Patient/family education   PT Next Visit Plan Balance program and LE strengthening.    Pt to call about AFO   Consulted and Agree with Plan of Care Patient      Patient will benefit from skilled therapeutic intervention in order to  improve the following deficits and impairments:  Abnormal gait, Decreased activity tolerance, Decreased balance, Decreased coordination, Difficulty walking  Visit Diagnosis: Unsteadiness on feet  Muscle weakness (generalized)     Problem List Patient Active Problem List   Diagnosis Date Noted  . Weakness   . Aortic atherosclerosis ([place]) 06/03/2016  .  General weakness   . Protein-calorie malnutrition, severe ([place])   . Failure to thrive in adult   . Bradycardia 06/02/2016  . GI bleed 06/01/2016  . Slurred speech 06/01/2016  . Pyrexia   . Leukocytosis   . 1st degree AV block   . Cardiogenic shock ([place])   . AV block, Mobitz 2 04/27/2016  . ST elevation (STEMI) myocardial infarction involving other coronary artery of inferior wall ([place]) 04/27/2016  . STEMI (ST elevation myocardial infarction) ([place]) 04/27/2016  . Bladder cancer ([place]) 04/18/2016  . Vitamin D deficiency 10/15/2015  . Prostate cancer ([place])   . Hypercholesterolemia   . Essential hypertension   . Seizures ([place])   . GERD 08/22/2008  . COLONIC POLYPS, ADENOMATOUS, HX OF 08/22/2008    Zuriyah Shatz,CHRIS, PTA 09/29/2016, 2:55 PM  [facility] [place], [place], [address] Phone: [phone]   Fax:  [phone]  Name: Joshua [name] MRN: [id number] Date of Birth: [birth year]

## 2016-10-04 ENCOUNTER — Encounter: Payer: Medicare Other | Admitting: *Deleted

## 2016-10-06 ENCOUNTER — Encounter: Payer: Medicare Other | Admitting: Physical Therapy

## 2016-10-10 ENCOUNTER — Encounter: Payer: Self-pay | Admitting: Interventional Cardiology

## 2016-10-10 ENCOUNTER — Ambulatory Visit (INDEPENDENT_AMBULATORY_CARE_PROVIDER_SITE_OTHER): Payer: Medicare Other | Admitting: Interventional Cardiology

## 2016-10-10 VITALS — BP 156/68 | HR 69 | Ht 73.0 in | Wt 183.6 lb

## 2016-10-10 DIAGNOSIS — I10 Essential (primary) hypertension: Secondary | ICD-10-CM | POA: Diagnosis not present

## 2016-10-10 DIAGNOSIS — I252 Old myocardial infarction: Secondary | ICD-10-CM | POA: Diagnosis not present

## 2016-10-10 DIAGNOSIS — I441 Atrioventricular block, second degree: Secondary | ICD-10-CM | POA: Diagnosis not present

## 2016-10-10 DIAGNOSIS — I700 Atherosclerosis of aorta: Secondary | ICD-10-CM | POA: Diagnosis not present

## 2016-10-10 DIAGNOSIS — I5032 Chronic diastolic (congestive) heart failure: Secondary | ICD-10-CM

## 2016-10-10 MED ORDER — SPIRONOLACTONE 25 MG PO TABS: 12.5000 mg | ORAL_TABLET | Freq: Every day | ORAL | 3 refills | Status: DC

## 2016-10-10 NOTE — Patient Instructions (Addendum)
Medication Instructions:  1) START Spironolactone 12.5mg  once daily.  Labwork: Plan to have a BMET at your follow up appointment with Dr. Tamala Julian.   Testing/Procedures: None  Follow-Up: Your physician recommends that you schedule a follow-up appointment in: 1 month with Dr. Tamala Julian. (Can have 10:45A on 11/11/16 or 10:15A on 11/07/16)   Any Other Special Instructions Will Be Listed Below (If Applicable).     If you need a refill on your cardiac medications before your next appointment, please call your pharmacy.

## 2016-10-10 NOTE — Progress Notes (Signed)
Cardiology Office Note    Date:  10/10/2016   ID:  Joshua [name], DOB 1932/05/11, MRN [id number]  PCP:  Redge Gainer, MD  Cardiologist: Sinclair Grooms, MD   Chief Complaint  Patient presents with  . Coronary Artery Disease  . Congestive Heart Failure    History of Present Illness:  Joshua [name] is a 83 y.o. male with history of sz disorder that followed intracerebral bleed from AV malformation,, prostate CA, bladder CA HTN, HLD, GERD, recent STEMI 05/10/2016 w/ DES to RCA complicated by Mobitz II-->HR improved and PPM not indicated.  He is doing relatively well. He is accompanied by his son. His been seen several times by Dr. Laurance Flatten in [place]. He denies chest pain. He has had one or 2 episodes of falling at home related to balance. This is been a chronic problem since he began having seizures. He has not had chest discomfort. He has not had syncope. He still has some anxiety about not having a pacemaker. He has not had palpitations or chest discomfort. His initial presentation was syncope preceded by nausea and vomiting. Angiography demonstrated a totally occluded large right coronary supplied by collaterals from the circumflex which also had moderate proximal to ostial disease. He's had no recurrence of such symptoms. He does complain of exertional dyspnea with moderate activity.  We spent a significant time discussing his coronary anatomy, we reviewed diagrams that allows him to see where his stents are and where residual disease is present. He has an ostial to proximal circumflex and a relatively small territory that was untreated and stated that have 80% stenosis. LAD was widely patent. After stenting the right coronary was widely patent with proximal and mid vessel stents present.   Past Medical History:  Diagnosis Date  . Allergy    pcn  . Arteriovenous malformation    ([facility]NS)  . Blood transfusion    right eye  no surgery as yet  . Bradycardia   . CAD (coronary artery  disease)    a. 04/27/2016 PCI with DES to RCA with 50% ostial to 60% segmental mid to distal left main, and 90% thrombus filled ostial to proximal circumflex, with distal right coronary filled by collaterals from left-to-right  . Constipation   . ED (erectile dysfunction)   . GERD (gastroesophageal reflux disease)   . Hypercholesterolemia   . Hypertension   . Muscle weakness   . Prostate cancer ([place]) 11/02/11 bxs   Adenocarcinoma,Gleason=3+3=6 & 3+4=7,PSA+1.58 Volume=19cc  . Prostate cancer ([place])   . Seizures ([place])   . STEMI (ST elevation myocardial infarction) ([place])   . Vitamin D deficiency     Past Surgical History:  Procedure Laterality Date  . ABDOMINAL SURGERY    . APPENDECTOMY    . CARDIAC CATHETERIZATION N/A 04/27/2016   Procedure: Left Heart Cath and Coronary Angiography;  Surgeon: Belva Crome, MD;  Location: [facility];  Service: Cardiovascular;  Laterality: N/A;  . CARDIAC CATHETERIZATION N/A 04/27/2016   Procedure: Temporary Pacemaker;  Surgeon: Belva Crome, MD;  Location: [facility];  Service: Cardiovascular;  Laterality: N/A;  . CARDIAC CATHETERIZATION N/A 04/27/2016   Procedure: Coronary Stent Intervention;  Surgeon: Belva Crome, MD;  Location: [facility];  Service: Cardiovascular;  Laterality: N/A;  Proximal RCA Mid RCA  . CORONARY STENT PLACEMENT    . CRANIOTOMY  1980  . CYSTOSCOPY  01/19/2012   Procedure: CYSTOSCOPY;  Surgeon: Malka So, MD;  Location: [facility];  Service: Urology;;  no seeds found in bladder  . CYSTOSCOPY W/ RETROGRADES Bilateral 04/19/2016   Procedure: CYSTOSCOPY WITH BILATERAL RETROGRADE PYELOGRAM TRANSURETHRAL RESECTION OF BLADDER TUMOR ;  Surgeon: [name], MD;  [facility];  Service: Urology;  Laterality: Bilateral;  . RADIOACTIVE SEED IMPLANT  01/19/2012   Procedure: RADIOACTIVE SEED IMPLANT;  Surgeon: Malka So, MD;  Location: [facility];  Service: Urology;  Laterality: N/A;  68  seeds implanted    Current Medications: Outpatient Medications Prior to Visit  Medication Sig Dispense Refill  . aspirin EC 81 MG tablet Take 1 tablet (81 mg total) by mouth daily. 90 tablet 3  . atorvastatin (LIPITOR) 20 MG tablet Take 1 tablet (20 mg total) by mouth daily. 90 tablet 3  . Cholecalciferol (VITAMIN D3) 1000 units CAPS Take 1 capsule by mouth daily.    . citalopram (CELEXA) 20 MG tablet Take 1 tablet (20 mg total) by mouth daily. 90 tablet 1  . clopidogrel (PLAVIX) 75 MG tablet Take 1 tablet (75 mg total) by mouth daily. 90 tablet 3  . PHENobarbital (LUMINAL) 64.8 MG tablet Take 1 tablet (64.8 mg total) by mouth 2 (two) times daily. 180 tablet 3  . phenytoin (DILANTIN) 100 MG ER capsule Take 2 caps in the morning and 3 caps in the evening (Patient not taking: Reported on 10/10/2016) 450 capsule 3   No facility-administered medications prior to visit.      Allergies:   Penicillins   Social History   Social History  . Marital status: Widowed    Spouse name: N/A  . Number of children: 2  . Years of education: N/A   Occupational History  . Retired    Social History Main Topics  . Smoking status: Former Smoker    Packs/day: 2.00    Years: 15.00    Quit date: 01/15/1973  . Smokeless tobacco: Never Used  . Alcohol use Yes     Comment: occasionally  . Drug use: No     Comment: quit smoking 40 years ago  . Sexual activity: Not Asked   Other Topics Concern  . None   Social History Narrative   Went to [facility] after MI for rehab.     Family History:  The patient's family history includes Emphysema in his father and mother; Stroke in his mother.   ROS:   Please see the history of present illness.    The initial presentation with acute infarction was nausea, vomiting, and weakness. He has had no recurrence of those symptoms.  All other systems reviewed and are negative.   PHYSICAL EXAM:   VS:  BP (!) 156/68 (BP Location: Right Arm)   Pulse 69   Ht  6\' 1"  (1.854 m)   Wt 183 lb 9.6 oz (83.3 kg)   BMI 24.22 kg/m    GEN: Well nourished, well developed, in no acute distress  HEENT: normal  Neck: There is 2 cm of JVD with the patient lying at 30. There are no carotid bruits, or masses. Cardiac: RRR; no murmurs, rubs, or gallops.  There is symmetrical 2+ lateral lower extremity edema.  Respiratory:  clear to auscultation bilaterally, normal work of breathing GI: soft, nontender, nondistended, + BS MS: no deformity or atrophy  Skin: warm and dry, no rash Neuro:  Alert and Oriented x 3, Strength and sensation are intact Psych: euthymic mood, full affect  Wt Readings from Last 3 Encounters:  10/10/16 183 lb 9.6 oz (83.3 kg)  08/22/16 183 lb (83 kg)  07/08/16 178 lb (80.7 kg)      Studies/Labs Reviewed:   EKG:  EKG  An EKG is not repeated.  Recent Labs: 06/01/2016: B Natriuretic Peptide 107.1 06/04/2016: Hemoglobin 9.5 07/08/2016: ALT 27; Platelets 403 08/22/2016: BUN 17; Creatinine, Ser 0.81; Potassium 4.8; Sodium 142   Lipid Panel    Component Value Date/Time   CHOL 165 07/08/2016 1226   TRIG 135 07/08/2016 1226   TRIG 85 01/20/2016 0958   HDL 74 07/08/2016 1226   HDL 69 01/20/2016 0958   CHOLHDL 2.2 07/08/2016 1226   CHOLHDL 3.3 04/27/2016 1133   VLDL 16 04/27/2016 1133   LDLCALC 64 07/08/2016 1226   LDLCALC 99 05/21/2014 1101    Additional studies/ records that were reviewed today include:  I reviewed the Holter monitor performed in September, 2017: There was no indication for pacing. Study Highlights     NSR with range 45-113 bpm. Ave. HR 80 bpm  First degree AV block and occasional Mobitz  No excessive bradycardia or tachycardia  No pauses > 3.0 seconds.   NSR with 1st and 2nd degree (Mobitz 1) AV block      ASSESSMENT:    1. Old MI (myocardial infarction)   2. Chronic diastolic heart failure ([place])   3. AV block, Mobitz 2   4. Essential hypertension   5. Aortic atherosclerosis (HCC)       PLAN:  In order of problems listed above:  1. As noted above we spent significant time today discussing his underlying coronary anatomy. We discussed whether or not the circumflex could be plan a role in exertional dyspnea. We discussed intervention on the ostial circumflex. Since there is no left main or LAD disease, I would prefer a conservative approach and intervention would result in potential injury to the left main or ostial LAD. He understands he risk of intervention and we have the option of proceeding in the future if symptoms become limiting. For now will maintain a conservative approach with reference to mechanical revascularization. 2. Dyspnea on exertion could be related to volume overload. He does have bilateral 2+ peripheral edema. Spironolactone 12.5 mg per day is started and we may have to further uptitrate.. Office visit in one month. Basic metabolic panel on that occasion. 3. No symptoms to suggest syncope. As noted above, the Holter monitor did not demonstrate any rhythm disturbance that will require pacing. 4. Elevated and needs some management. We discussed that his target pressures should be 145/90 mmHg or less. Adding spironolactone today should help better control this.  Overall plan is to further control the blood pressure and volume with spironolactone. A basic metabolic panel will be obtained on return in 4 weeks. He should call prior to that time of lightheadedness, dizziness, or other complaints. Conservative management of residual coronary disease at this time.  Office visit lasting greater than 30 minutes. Greater than 50% of the time was spent in counseling.  Medication Adjustments/Labs and Tests Ordered: Current medicines are reviewed at length with the patient today.  Concerns regarding medicines are outlined above.  Medication changes, Labs and Tests ordered today are listed in the Patient Instructions below. Patient Instructions  Medication Instructions:   1) START Spironolactone 12.5mg  once daily.  Labwork: Plan to have a BMET at your follow up appointment with Dr. Tamala Julian.   Testing/Procedures: None  Follow-Up: Your physician recommends that you schedule a follow-up appointment in: 1 month with  Dr. Tamala Julian. (Can have 10:45A on 11/11/16 or 10:15A on 11/07/16)   Any Other Special Instructions Will Be Listed Below (If Applicable).     If you need a refill on your cardiac medications before your next appointment, please call your pharmacy.      Signed, Sinclair Grooms, MD  10/10/2016 1:38 PM    [facility] [place], [place], [place]  [address] Phone: [phone]; Fax: [phone]

## 2016-10-11 ENCOUNTER — Ambulatory Visit: Payer: Medicare Other | Admitting: *Deleted

## 2016-10-11 DIAGNOSIS — R2681 Unsteadiness on feet: Secondary | ICD-10-CM | POA: Diagnosis not present

## 2016-10-11 DIAGNOSIS — M6281 Muscle weakness (generalized): Secondary | ICD-10-CM

## 2016-10-11 NOTE — Therapy (Signed)
[facility] [place], [place], [address] Phone: [phone]   Fax:  [phone]  Physical Therapy Treatment  Patient Details  Name: Joshua [name] MRN: [id number] Date of Birth: 1932/01/24 Referring Provider: Redge Gainer MD  Encounter Date: 10/11/2016      PT End of Session - 10/11/16 1128    Visit Number 5   Number of Visits 16   Date for PT Re-Evaluation 11/19/16   PT Start Time 1115   PT Stop Time [id number]   PT Time Calculation (min) 50 min      Past Medical History:  Diagnosis Date  . Allergy    pcn  . Arteriovenous malformation    ([facility])  . Blood transfusion    right eye  no surgery as yet  . Bradycardia   . CAD (coronary artery disease)    a. 04/27/2016 PCI with DES to RCA with 50% ostial to 60% segmental mid to distal left main, and 90% thrombus filled ostial to proximal circumflex, with distal right coronary filled by collaterals from left-to-right  . Constipation   . ED (erectile dysfunction)   . GERD (gastroesophageal reflux disease)   . Hypercholesterolemia   . Hypertension   . Muscle weakness   . Prostate cancer ([place]) 11/02/11 bxs   Adenocarcinoma,Gleason=3+3=6 & 3+4=7,PSA+1.58 Volume=19cc  . Prostate cancer ([place])   . Seizures ([place])   . STEMI (ST elevation myocardial infarction) ([place])   . Vitamin D deficiency     Past Surgical History:  Procedure Laterality Date  . ABDOMINAL SURGERY    . APPENDECTOMY    . CARDIAC CATHETERIZATION N/A 04/27/2016   Procedure: Left Heart Cath and Coronary Angiography;  Surgeon: Belva Crome, MD;  Location: [facility];  Service: Cardiovascular;  Laterality: N/A;  . CARDIAC CATHETERIZATION N/A 04/27/2016   Procedure: Temporary Pacemaker;  Surgeon: Belva Crome, MD;  Location: [facility];  Service: Cardiovascular;  Laterality: N/A;  . CARDIAC CATHETERIZATION N/A 04/27/2016   Procedure: Coronary Stent Intervention;  Surgeon: Belva Crome, MD;  Location: [facility];  Service: Cardiovascular;  Laterality: N/A;  Proximal RCA Mid RCA  . CORONARY STENT PLACEMENT    . CRANIOTOMY  1980  . CYSTOSCOPY  01/19/2012   Procedure: CYSTOSCOPY;  Surgeon: Malka So, MD;  Location: [facility];  Service: Urology;;  no seeds found in bladder  . CYSTOSCOPY W/ RETROGRADES Bilateral 04/19/2016   Procedure: CYSTOSCOPY WITH BILATERAL RETROGRADE PYELOGRAM TRANSURETHRAL RESECTION OF BLADDER TUMOR ;  Surgeon: [name], MD;  [facility];  Service: Urology;  Laterality: Bilateral;  . RADIOACTIVE SEED IMPLANT  01/19/2012   Procedure: RADIOACTIVE SEED IMPLANT;  Surgeon: Malka So, MD;  Location: [facility];  Service: Urology;  Laterality: N/A;  68 seeds implanted    There were no vitals filed for this visit.      Subjective Assessment - 10/11/16 1127    Subjective Did ok after last Rx. Feel fairly good today   Pertinent History Focal seizures many years ago affecting his right LE.   Patient Stated Goals Walk better and not lose balance.                         [facility] Adult PT Treatment/Exercise - 10/11/16 0001      Exercises   Exercises Knee/Hip     Knee/Hip Exercises: Aerobic   Nustep Level 5 x 20 minutes  Knee/Hip Exercises: Machines for Strengthening   Cybex Knee Extension 10# 3x 10RT LE     Knee/Hip Exercises: Standing   Rocker Board 5 minutes  calf stretching and balance SBA   Other Standing Knee Exercises 6 in toe taps for balance CGA, standing in tandem 2x20 UE reaches, standing on airex UE reaches 2x 20     Knee/Hip Exercises: Seated   Hamstring Curl Strengthening;Right;3 sets;10 reps  yellow   Sit to Sand 2 sets;10 reps;without UE support  high mat table                  PT Short Term Goals - 09/20/16 1752      PT SHORT TERM GOAL #1   Title Berg score= 46/56.   Time 4   Period Weeks   Status New           PT Long Term Goals - 09/20/16 1752      PT LONG  TERM GOAL #1   Title Berg score= 49-50/56.   Time 8   Period Weeks   Status New     PT LONG TERM GOAL #2   Title Independent with a HEP.   Time 8   Period Weeks   Status New               Plan - 10/11/16 1805    Clinical Impression Statement Pt did fairly well with LE strengthening and balancing activities. Rockerboard and Tandem stance with RT foot forward was the most challenging Exs today and needed CGA during both.    Rehab Potential Good   PT Frequency 2x / week   PT Duration 8 weeks   PT Treatment/Interventions ADLs/Self Care Home Management;Gait training;Functional mobility training;Therapeutic activities;Therapeutic exercise;Balance training;Neuromuscular re-education;Patient/family education   PT Next Visit Plan Balance program and LE strengthening.    Pt to call about AFO   Consulted and Agree with Plan of Care Patient      Patient will benefit from skilled therapeutic intervention in order to improve the following deficits and impairments:  Abnormal gait, Decreased activity tolerance, Decreased balance, Decreased coordination, Difficulty walking  Visit Diagnosis: Unsteadiness on feet  Muscle weakness (generalized)     Problem List Patient Active Problem List   Diagnosis Date Noted  . Aortic atherosclerosis ([place]) 06/03/2016  . General weakness   . Protein-calorie malnutrition, severe ([place])   . Bradycardia 06/02/2016  . GI bleed 06/01/2016  . 1st degree AV block   . AV block, Mobitz 2 04/27/2016  . Old MI (myocardial infarction) 04/27/2016  . Bladder cancer ([place]) 04/18/2016  . Vitamin D deficiency 10/15/2015  . Prostate cancer ([place])   . Hypercholesterolemia   . Essential hypertension   . Seizures ([place])   . GERD 08/22/2008  . COLONIC POLYPS, ADENOMATOUS, HX OF 08/22/2008    RAMSEUR,CHRIS, PTA 10/11/2016, 6:14 PM  [facility] [place], [place], [address] Phone: [phone]   Fax:   [phone]  Name: Joshua [name] MRN: [id number] Date of Birth: [birth year]

## 2016-10-13 ENCOUNTER — Encounter: Payer: Medicare Other | Admitting: Physical Therapy

## 2016-10-18 ENCOUNTER — Ambulatory Visit: Payer: Medicare Other | Admitting: *Deleted

## 2016-10-18 DIAGNOSIS — M6281 Muscle weakness (generalized): Secondary | ICD-10-CM | POA: Diagnosis not present

## 2016-10-18 DIAGNOSIS — R2681 Unsteadiness on feet: Secondary | ICD-10-CM

## 2016-10-18 NOTE — Therapy (Signed)
[facility] [place], [place], [address] Phone: [phone]   Fax:  [phone]  Physical Therapy Treatment  Patient Details  Name: Joshua [name] MRN: [id number] Date of Birth: May 13, 1932 Referring Provider: Redge Gainer MD  Encounter Date: 10/18/2016      PT End of Session - 10/18/16 1311    Visit Number 6   Number of Visits 16   Date for PT Re-Evaluation 11/19/16   PT Start Time [id number]   PT Stop Time 1350   PT Time Calculation (min) 46 min      Past Medical History:  Diagnosis Date  . Allergy    pcn  . Arteriovenous malformation    ([facility])  . Blood transfusion    right eye  no surgery as yet  . Bradycardia   . CAD (coronary artery disease)    a. 04/27/2016 PCI with DES to RCA with 50% ostial to 60% segmental mid to distal left main, and 90% thrombus filled ostial to proximal circumflex, with distal right coronary filled by collaterals from left-to-right  . Constipation   . ED (erectile dysfunction)   . GERD (gastroesophageal reflux disease)   . Hypercholesterolemia   . Hypertension   . Muscle weakness   . Prostate cancer ([place]) 11/02/11 bxs   Adenocarcinoma,Gleason=3+3=6 & 3+4=7,PSA+1.58 Volume=19cc  . Prostate cancer ([place])   . Seizures ([place])   . STEMI (ST elevation myocardial infarction) ([place])   . Vitamin D deficiency     Past Surgical History:  Procedure Laterality Date  . ABDOMINAL SURGERY    . APPENDECTOMY    . CARDIAC CATHETERIZATION N/A 04/27/2016   Procedure: Left Heart Cath and Coronary Angiography;  Surgeon: Belva Crome, MD;  Location: [facility];  Service: Cardiovascular;  Laterality: N/A;  . CARDIAC CATHETERIZATION N/A 04/27/2016   Procedure: Temporary Pacemaker;  Surgeon: Belva Crome, MD;  Location: [facility];  Service: Cardiovascular;  Laterality: N/A;  . CARDIAC CATHETERIZATION N/A 04/27/2016   Procedure: Coronary Stent Intervention;  Surgeon: Belva Crome, MD;  Location: [facility];  Service: Cardiovascular;  Laterality: N/A;  Proximal RCA Mid RCA  . CORONARY STENT PLACEMENT    . CRANIOTOMY  1980  . CYSTOSCOPY  01/19/2012   Procedure: CYSTOSCOPY;  Surgeon: Malka So, MD;  [facility];  Service: Urology;;  no seeds found in bladder  . CYSTOSCOPY W/ RETROGRADES Bilateral 04/19/2016   Procedure: CYSTOSCOPY WITH BILATERAL RETROGRADE PYELOGRAM TRANSURETHRAL RESECTION OF BLADDER TUMOR ;  Surgeon: [name], MD;  [facility];  Service: Urology;  Laterality: Bilateral;  . RADIOACTIVE SEED IMPLANT  01/19/2012   Procedure: RADIOACTIVE SEED IMPLANT;  Surgeon: Malka So, MD;  Location: [facility];  Service: Urology;  Laterality: N/A;  68 seeds implanted    There were no vitals filed for this visit.      Subjective Assessment - 10/18/16 1311    Subjective Did ok after last Rx. Feel fairly good today   Pertinent History Focal seizures many years ago affecting his right LE.   Patient Stated Goals Walk better and not lose balance.   Currently in Pain? No/denies                         [facility] Adult PT Treatment/Exercise - 10/18/16 0001      Balance   Balance Assessed Yes     Exercises   Exercises  Knee/Hip     Knee/Hip Exercises: Aerobic   Nustep Level 5 x 20 minutes     Knee/Hip Exercises: Machines for Strengthening   Cybex Knee Extension 10# 3x 10RT LE     Knee/Hip Exercises: Standing   Rocker Board 5 minutes  calf stretching and balance SBA   Other Standing Knee Exercises 6 in toe taps for balance CGA, standing in tandem 2x20 UE reaches, standing on airex UE reaches 2x 20     Knee/Hip Exercises: Seated   Sit to Sand 2 sets;10 reps;without UE support  high mat table             Balance Exercises - 10/18/16 1318      Balance Exercises: Standing   Standing Eyes Closed Narrow base of support (BOS);Foam/compliant surface  UE reaching 2x 20 on Airex   Tandem Stance Eyes open              PT Short Term Goals - 09/20/16 1752      PT SHORT TERM GOAL #1   Title Berg score= 46/56.   Time 4   Period Weeks   Status New           PT Long Term Goals - 09/20/16 1752      PT LONG TERM GOAL #1   Title Berg score= 49-50/56.   Time 8   Period Weeks   Status New     PT LONG TERM GOAL #2   Title Independent with a HEP.   Time 8   Period Weeks   Status New               Plan - 10/18/16 1320    Clinical Impression Statement Pt arrived to clinic today doing fairly well . No LOB to report in the last week. Pt still has not persued AFO prescription for RT foot.   Rehab Potential Good   PT Frequency 2x / week   PT Duration 8 weeks   PT Treatment/Interventions ADLs/Self Care Home Management;Gait training;Functional mobility training;Therapeutic activities;Therapeutic exercise;Balance training;Neuromuscular re-education;Patient/family education   PT Next Visit Plan Balance program and LE strengthening.    Pt to call about AFO   Consulted and Agree with Plan of Care Patient      Patient will benefit from skilled therapeutic intervention in order to improve the following deficits and impairments:  Abnormal gait, Decreased activity tolerance, Decreased balance, Decreased coordination, Difficulty walking  Visit Diagnosis: Unsteadiness on feet  Muscle weakness (generalized)     Problem List Patient Active Problem List   Diagnosis Date Noted  . Aortic atherosclerosis ([place]) 06/03/2016  . General weakness   . Protein-calorie malnutrition, severe ([place])   . Bradycardia 06/02/2016  . GI bleed 06/01/2016  . 1st degree AV block   . AV block, Mobitz 2 04/27/2016  . Old MI (myocardial infarction) 04/27/2016  . Bladder cancer ([place]) 04/18/2016  . Vitamin D deficiency 10/15/2015  . Prostate cancer ([place])   . Hypercholesterolemia   . Essential hypertension   . Seizures ([place])   . GERD 08/22/2008  . COLONIC POLYPS, ADENOMATOUS, HX OF 08/22/2008     RAMSEUR,CHRIS, PTA 10/18/2016, 1:56 PM  [facility] [place], [place], [address] Phone: [phone]   Fax:  [phone]  Name: Joshua [name] MRN: [id number] Date of Birth: [birth year]

## 2016-10-18 NOTE — Therapy (Signed)
[facility] [place], [place], [address] Phone: [phone]   Fax:  [phone]  Physical Therapy Treatment  Patient Details  Name: Joshua [name] MRN: [id number] Date of Birth: [birth year] Referring Provider: Redge Gainer MD  Encounter Date: 10/18/2016      PT End of Session - 10/18/16 1311    Visit Number 6   Number of Visits 16   Date for PT Re-Evaluation 11/19/16   PT Start Time [id number]   PT Stop Time 1350   PT Time Calculation (min) 46 min      Past Medical History:  Diagnosis Date  . Allergy    pcn  . Arteriovenous malformation    (CNS)  . Blood transfusion    right eye  no surgery as yet  . Bradycardia   . CAD (coronary artery disease)    a. 04/27/2016 PCI with DES to RCA with 50% ostial to 60% segmental mid to distal left main, and 90% thrombus filled ostial to proximal circumflex, with distal right coronary filled by collaterals from left-to-right  . Constipation   . ED (erectile dysfunction)   . GERD (gastroesophageal reflux disease)   . Hypercholesterolemia   . Hypertension   . Muscle weakness   . Prostate cancer ([place]) 11/02/11 bxs   Adenocarcinoma,Gleason=3+3=6 & 3+4=7,PSA+1.58 Volume=19cc  . Prostate cancer ([place])   . Seizures ([place])   . STEMI (ST elevation myocardial infarction) ([place])   . Vitamin D deficiency     Past Surgical History:  Procedure Laterality Date  . ABDOMINAL SURGERY    . APPENDECTOMY    . CARDIAC CATHETERIZATION N/A 04/27/2016   Procedure: Left Heart Cath and Coronary Angiography;  Surgeon: Belva Crome, MD;  Location: [place] [facility];  Service: Cardiovascular;  Laterality: N/A;  . CARDIAC CATHETERIZATION N/A 04/27/2016   Procedure: Temporary Pacemaker;  Surgeon: Belva Crome, MD;  Location: [place] [facility];  Service: Cardiovascular;  Laterality: N/A;  . CARDIAC CATHETERIZATION N/A 04/27/2016   Procedure: Coronary Stent Intervention;  Surgeon: Belva Crome, MD;  Location: [facility];  Service: Cardiovascular;  Laterality: N/A;  Proximal RCA Mid RCA  . CORONARY STENT PLACEMENT    . CRANIOTOMY  1980  . CYSTOSCOPY  01/19/2012   Procedure: CYSTOSCOPY;  Surgeon: Malka So, MD;  Location: [facility];  Service: Urology;;  no seeds found in bladder  . CYSTOSCOPY W/ RETROGRADES Bilateral 04/19/2016   Procedure: CYSTOSCOPY WITH BILATERAL RETROGRADE PYELOGRAM TRANSURETHRAL RESECTION OF BLADDER TUMOR ;  Surgeon: [name], MD;  Location: [facility];  Service: Urology;  Laterality: Bilateral;  . RADIOACTIVE SEED IMPLANT  01/19/2012   Procedure: RADIOACTIVE SEED IMPLANT;  Surgeon: Malka So, MD;  Location: [facility];  Service: Urology;  Laterality: N/A;  68 seeds implanted    There were no vitals filed for this visit.      Subjective Assessment - 10/18/16 1311    Subjective Did ok after last Rx. Feel fairly good today   Pertinent History Focal seizures many years ago affecting his right LE.   Patient Stated Goals Walk better and not lose balance.   Currently in Pain? No/denies                         [facility] Adult PT Treatment/Exercise - 10/18/16 0001      Balance   Balance Assessed Yes     Exercises   Exercises  Knee/Hip     Knee/Hip Exercises: Aerobic   Nustep Level 5 x 20 minutes     Knee/Hip Exercises: Machines for Strengthening   Cybex Knee Extension 10# 3x 10RT LE     Knee/Hip Exercises: Standing   Rocker Board 5 minutes  calf stretching and balance SBA   Other Standing Knee Exercises 6 in toe taps for balance CGA, standing in tandem 2x20 UE reaches, standing on airex UE reaches 2x 20     Knee/Hip Exercises: Seated   Sit to Sand 2 sets;10 reps;without UE support  high mat table             Balance Exercises - 10/18/16 1318      Balance Exercises: Standing   Standing Eyes Closed Narrow base of support (BOS);Foam/compliant surface  UE reaching 2x 20 on Airex   Tandem Stance Eyes open              PT Short Term Goals - 09/20/16 1752      PT SHORT TERM GOAL #1   Title Berg score= 46/56.   Time 4   Period Weeks   Status New           PT Long Term Goals - 09/20/16 1752      PT LONG TERM GOAL #1   Title Berg score= 49-50/56.   Time 8   Period Weeks   Status New     PT LONG TERM GOAL #2   Title Independent with a HEP.   Time 8   Period Weeks   Status New               Plan - 10/18/16 1320    Clinical Impression Statement Pt arrived to clinic today doing fairly well . No LOB to report in the last week. Pt still has not persued AFO prescription for RT foot. He was able to complete therex and balance Exs easier  today with less assistance. He was able to perform HS curls on the machine todayas well. Good progression today   Rehab Potential Good   PT Frequency 2x / week   PT Duration 8 weeks   PT Treatment/Interventions ADLs/Self Care Home Management;Gait training;Functional mobility training;Therapeutic activities;Therapeutic exercise;Balance training;Neuromuscular re-education;Patient/family education   PT Next Visit Plan Balance program and LE strengthening.    Pt to call about AFO   Consulted and Agree with Plan of Care Patient      Patient will benefit from skilled therapeutic intervention in order to improve the following deficits and impairments:  Abnormal gait, Decreased activity tolerance, Decreased balance, Decreased coordination, Difficulty walking  Visit Diagnosis: Unsteadiness on feet  Muscle weakness (generalized)     Problem List Patient Active Problem List   Diagnosis Date Noted  . Aortic atherosclerosis ([place]) 06/03/2016  . General weakness   . Protein-calorie malnutrition, severe ([place])   . Bradycardia 06/02/2016  . GI bleed 06/01/2016  . 1st degree AV block   . AV block, Mobitz 2 04/27/2016  . Old MI (myocardial infarction) 04/27/2016  . Bladder cancer ([place]) 04/18/2016  . Vitamin D deficiency 10/15/2015  . Prostate  cancer ([place])   . Hypercholesterolemia   . Essential hypertension   . Seizures ([place])   . GERD 08/22/2008  . COLONIC POLYPS, ADENOMATOUS, HX OF 08/22/2008    Valeda Corzine,CHRIS 10/18/2016, 2:04 PM  [facility] [place], [place], [address] Phone: [phone]   Fax:  [phone]  Name: Joshua [name] MRN: [id number] Date  of Birth: 01/10/1932

## 2016-10-20 ENCOUNTER — Ambulatory Visit: Payer: Medicare Other | Attending: Family Medicine | Admitting: Physical Therapy

## 2016-10-20 DIAGNOSIS — R2681 Unsteadiness on feet: Secondary | ICD-10-CM

## 2016-10-20 DIAGNOSIS — M6281 Muscle weakness (generalized): Secondary | ICD-10-CM | POA: Insufficient documentation

## 2016-10-20 NOTE — Therapy (Signed)
[facility] [place], [place], [address] Phone: [phone]   Fax:  [phone]  Physical Therapy Treatment  Patient Details  Name: Joshua [name] MRN: [id number] Date of Birth: 1932/07/26 Referring Provider: Redge Gainer MD  Encounter Date: 10/20/2016      PT End of Session - 10/20/16 1300    PT Start Time 1211   PT Stop Time 1259   PT Time Calculation (min) 48 min   Activity Tolerance Patient tolerated treatment well   Behavior During Therapy [facility] for tasks assessed/performed      Past Medical History:  Diagnosis Date  . Allergy    pcn  . Arteriovenous malformation    ([place]NS)  . Blood transfusion    right eye  no surgery as yet  . Bradycardia   . CAD (coronary artery disease)    a. 04/27/2016 PCI with DES to RCA with 50% ostial to 60% segmental mid to distal left main, and 90% thrombus filled ostial to proximal circumflex, with distal right coronary filled by collaterals from left-to-right  . Constipation   . ED (erectile dysfunction)   . GERD (gastroesophageal reflux disease)   . Hypercholesterolemia   . Hypertension   . Muscle weakness   . Prostate cancer ([place]) 11/02/11 bxs   Adenocarcinoma,Gleason=3+3=6 & 3+4=7,PSA+1.58 Volume=19cc  . Prostate cancer ([place])   . Seizures ([place])   . STEMI (ST elevation myocardial infarction) ([place])   . Vitamin D deficiency     Past Surgical History:  Procedure Laterality Date  . ABDOMINAL SURGERY    . APPENDECTOMY    . CARDIAC CATHETERIZATION N/A 04/27/2016   Procedure: Left Heart Cath and Coronary Angiography;  Surgeon: Belva Crome, MD;  Location: [place] [facility];  Service: Cardiovascular;  Laterality: N/A;  . CARDIAC CATHETERIZATION N/A 04/27/2016   Procedure: Temporary Pacemaker;  Surgeon: Belva Crome, MD;  Location: [place] [facility];  Service: Cardiovascular;  Laterality: N/A;  . CARDIAC CATHETERIZATION N/A 04/27/2016   Procedure: Coronary Stent Intervention;  Surgeon:  Belva Crome, MD;  Location: [place] [facility];  Service: Cardiovascular;  Laterality: N/A;  Proximal RCA Mid RCA  . CORONARY STENT PLACEMENT    . CRANIOTOMY  1980  . CYSTOSCOPY  01/19/2012   Procedure: CYSTOSCOPY;  Surgeon: Malka So, MD;  Location: [facility];  Service: Urology;;  no seeds found in bladder  . CYSTOSCOPY W/ RETROGRADES Bilateral 04/19/2016   Procedure: CYSTOSCOPY WITH BILATERAL RETROGRADE PYELOGRAM TRANSURETHRAL RESECTION OF BLADDER TUMOR ;  Surgeon: [name], MD;  [facility];  Service: Urology;  Laterality: Bilateral;  . RADIOACTIVE SEED IMPLANT  01/19/2012   Procedure: RADIOACTIVE SEED IMPLANT;  Surgeon: Malka So, MD;  Location: [facility];  Service: Urology;  Laterality: N/A;  68 seeds implanted    There were no vitals filed for this visit.      Subjective Assessment - 10/20/16 1238    Subjective I did okay.     Patient Stated Goals Walk better and not lose balance.     Treatment:  Nustep level 6 x 20 minutes with 02 sat at 91% post-exercise with a steady and quick recovery; Rockerboard x 5 minutes f/b 6 inch step-ups x 6 minutes; knee ext with 10# x 4 minutes; 30# ham curls x 4 minutes f/b red theraband x 2 minutes (assisted).  Excellent job today.  PT Short Term Goals - 09/20/16 1752      PT SHORT TERM GOAL #1   Title Berg score= 46/56.   Time 4   Period Weeks   Status New           PT Long Term Goals - 09/20/16 1752      PT LONG TERM GOAL #1   Title Berg score= 49-50/56.   Time 8   Period Weeks   Status New     PT LONG TERM GOAL #2   Title Independent with a HEP.   Time 8   Period Weeks   Status New             Patient will benefit from skilled therapeutic intervention in order to improve the following deficits and impairments:  Abnormal gait, Decreased activity tolerance, Decreased balance, Decreased coordination, Difficulty  walking  Visit Diagnosis: Unsteadiness on feet  Muscle weakness (generalized)     Problem List Patient Active Problem List   Diagnosis Date Noted  . Aortic atherosclerosis ([place]) 06/03/2016  . General weakness   . Protein-calorie malnutrition, severe ([place])   . Bradycardia 06/02/2016  . GI bleed 06/01/2016  . 1st degree AV block   . AV block, Mobitz 2 04/27/2016  . Old MI (myocardial infarction) 04/27/2016  . Bladder cancer ([place]) 04/18/2016  . Vitamin D deficiency 10/15/2015  . Prostate cancer ([place])   . Hypercholesterolemia   . Essential hypertension   . Seizures ([place])   . GERD 08/22/2008  . COLONIC POLYPS, ADENOMATOUS, HX OF 08/22/2008    Katrina Daddona, [place] 10/20/2016, 1:15 PM  [facility] [address] [place], [place], [address] Phone: [phone]   Fax:  [phone]  Name: Joshua [name] MRN: [id number] Date of Birth: 08-11-1932

## 2016-10-24 ENCOUNTER — Encounter: Payer: Medicare Other | Admitting: Physical Therapy

## 2016-10-24 DIAGNOSIS — C672 Malignant neoplasm of lateral wall of bladder: Secondary | ICD-10-CM | POA: Diagnosis not present

## 2016-10-24 DIAGNOSIS — Z8546 Personal history of malignant neoplasm of prostate: Secondary | ICD-10-CM | POA: Diagnosis not present

## 2016-10-25 ENCOUNTER — Telehealth: Payer: Self-pay | Admitting: Interventional Cardiology

## 2016-10-25 ENCOUNTER — Encounter (HOSPITAL_COMMUNITY): Payer: Self-pay | Admitting: Family Medicine

## 2016-10-25 ENCOUNTER — Ambulatory Visit: Payer: Medicare Other | Admitting: Physical Therapy

## 2016-10-25 ENCOUNTER — Telehealth (HOSPITAL_COMMUNITY): Payer: Self-pay | Admitting: Family Medicine

## 2016-10-25 DIAGNOSIS — R2681 Unsteadiness on feet: Secondary | ICD-10-CM | POA: Diagnosis not present

## 2016-10-25 DIAGNOSIS — M6281 Muscle weakness (generalized): Secondary | ICD-10-CM | POA: Diagnosis not present

## 2016-10-25 NOTE — Telephone Encounter (Signed)
The patient is on both aspirin and Plavix. His request only to hold aspirin? Plavix would also need to be held. He is now greater than 6 months out from acute infarction. Brief with whole of these medications for 4-7 days is possible with only a slight increase in risk of acute stent thrombosis.

## 2016-10-25 NOTE — Telephone Encounter (Signed)
Faxed to requesting office. 

## 2016-10-25 NOTE — Telephone Encounter (Signed)
Mailed ltr and Cardiac Rehab Program to pt... And sent msg through my chart.... KJ

## 2016-10-25 NOTE — Telephone Encounter (Signed)
Request for surgical clearance:  1. What type of surgery is being performed? Bladder Biopsy   2. When is this surgery scheduled? Pending    3. Are there any medications that need to be held prior to surgery and how long?Asprin , 5 days Prior    4. Name of physician performing surgery? Dr. Jeffie Pollock   5. What is your office phone and fax number? [place], fax# [phone].

## 2016-10-25 NOTE — Therapy (Signed)
[facility] [place], [place], [address] Phone: [phone]   Fax:  [phone]  Physical Therapy Treatment  Patient Details  Name: Joshua [name] MRN: [id number] Date of Birth: [birth year] Referring Provider: Redge Gainer MD  Encounter Date: 10/25/2016      PT End of Session - 10/25/16 1408    Visit Number 7   Number of Visits 16   Date for PT Re-Evaluation 11/19/16   PT Start Time [id number]  patient 14 min late   PT Stop Time 1430   PT Time Calculation (min) 31 min   Activity Tolerance Patient tolerated treatment well   Behavior During Therapy [facility] for tasks assessed/performed      Past Medical History:  Diagnosis Date  . Allergy    pcn  . Arteriovenous malformation    ([place])  . Blood transfusion    right eye  no surgery as yet  . Bradycardia   . CAD (coronary artery disease)    a. 04/27/2016 PCI with DES to RCA with 50% ostial to 60% segmental mid to distal left main, and 90% thrombus filled ostial to proximal circumflex, with distal right coronary filled by collaterals from left-to-right  . Constipation   . ED (erectile dysfunction)   . GERD (gastroesophageal reflux disease)   . Hypercholesterolemia   . Hypertension   . Muscle weakness   . Prostate cancer ([place]) 11/02/11 bxs   Adenocarcinoma,Gleason=3+3=6 & 3+4=7,PSA+1.58 Volume=19cc  . Prostate cancer ([place])   . Seizures ([place])   . STEMI (ST elevation myocardial infarction) ([place])   . Vitamin D deficiency     Past Surgical History:  Procedure Laterality Date  . ABDOMINAL SURGERY    . APPENDECTOMY    . CARDIAC CATHETERIZATION N/A 04/27/2016   Procedure: Left Heart Cath and Coronary Angiography;  Surgeon: Belva Crome, MD;  Location: [place] [facility];  Service: Cardiovascular;  Laterality: N/A;  . CARDIAC CATHETERIZATION N/A 04/27/2016   Procedure: Temporary Pacemaker;  Surgeon: Belva Crome, MD;  Location: [place] [facility];  Service: Cardiovascular;  Laterality:  N/A;  . CARDIAC CATHETERIZATION N/A 04/27/2016   Procedure: Coronary Stent Intervention;  Surgeon: Belva Crome, MD;  Location: [place] [facility];  Service: Cardiovascular;  Laterality: N/A;  Proximal RCA Mid RCA  . CORONARY STENT PLACEMENT    . CRANIOTOMY  1980  . CYSTOSCOPY  01/19/2012   Procedure: CYSTOSCOPY;  Surgeon: Malka So, MD;  Location: [facility];  Service: Urology;;  no seeds found in bladder  . CYSTOSCOPY W/ RETROGRADES Bilateral 04/19/2016   Procedure: CYSTOSCOPY WITH BILATERAL RETROGRADE PYELOGRAM TRANSURETHRAL RESECTION OF BLADDER TUMOR ;  Surgeon: [name], MD;  [facility];  Service: Urology;  Laterality: Bilateral;  . RADIOACTIVE SEED IMPLANT  01/19/2012   Procedure: RADIOACTIVE SEED IMPLANT;  Surgeon: Malka So, MD;  Location: [facility];  Service: Urology;  Laterality: N/A;  68 seeds implanted    There were no vitals filed for this visit.      Subjective Assessment - 10/25/16 1534    Subjective Patient was 14 min late for appt. Patient feels he is improving with balance somewhat.   Pertinent History Focal seizures many years ago affecting his right LE.   Patient Stated Goals Walk better and not lose balance.   Currently in Pain? No/denies  [facility] Adult PT Treatment/Exercise - 10/25/16 0001      Knee/Hip Exercises: Standing   Forward Step Up Both;1 set;10 reps   Other Standing Knee Exercises 6 inch toe taps 2x10     Knee/Hip Exercises: Seated   Sit to Sand 10 reps;3 sets;without UE support  just a little more than 90 deg hip flexion             Balance Exercises - 10/25/16 1421      Balance Exercises: Standing   Stepping Strategy Anterior  with opp arm reach x 10 B   Rockerboard Anterior/posterior;30 seconds  multiple reps; 30 sec max   Balance Beam toes on beam with reaches    Partial Tandem Stance Eyes open;Time  reaches, reach with rotation             PT  Short Term Goals - 09/20/16 1752      PT SHORT TERM GOAL #1   Title Berg score= 46/56.   Time 4   Period Weeks   Status New           PT Long Term Goals - 09/20/16 1752      PT LONG TERM GOAL #1   Title Berg score= 49-50/56.   Time 8   Period Weeks   Status New     PT LONG TERM GOAL #2   Title Independent with a HEP.   Time 8   Period Weeks   Status New               Plan - 10/25/16 1535    Clinical Impression Statement Patient presented to clinic 14 min late today. He did very well with balancing on rockerboard today (30 sec), however he balances primarily through his forefeet and has greater difficulty when board is level. He did well with rotational exercises in semitandem stance as well. Patient still has difficulty with LOB during toe taps, but only when raising RLE. Goals are ongoing.   Rehab Potential Good   PT Frequency 2x / week   PT Duration 8 weeks   PT Treatment/Interventions ADLs/Self Care Home Management;Gait training;Functional mobility training;Therapeutic activities;Therapeutic exercise;Balance training;Neuromuscular re-education;Patient/family education   PT Next Visit Plan Ankle isolator. Lower mat table for sit to stand to 90 deg hip flexion.   Consulted and Agree with Plan of Care Patient      Patient will benefit from skilled therapeutic intervention in order to improve the following deficits and impairments:     Visit Diagnosis: Unsteadiness on feet     Problem List Patient Active Problem List   Diagnosis Date Noted  . Aortic atherosclerosis ([place]) 06/03/2016  . General weakness   . Protein-calorie malnutrition, severe ([place])   . Bradycardia 06/02/2016  . GI bleed 06/01/2016  . 1st degree AV block   . AV block, Mobitz 2 04/27/2016  . Old MI (myocardial infarction) 04/27/2016  . Bladder cancer ([place]) 04/18/2016  . Vitamin D deficiency 10/15/2015  . Prostate cancer ([place])   . Hypercholesterolemia   . Essential hypertension   .  Seizures ([place])   . GERD 08/22/2008  . COLONIC POLYPS, ADENOMATOUS, HX OF 08/22/2008    Joshua [name] PT 10/25/2016, 3:43 PM  [facility] [address] [place], [place], [address] Phone: [phone]   Fax:  [phone]  Name: Joshua [name] MRN: [id number] Date of Birth: [birth year]

## 2016-10-26 ENCOUNTER — Other Ambulatory Visit: Payer: Self-pay | Admitting: Urology

## 2016-10-27 ENCOUNTER — Encounter: Payer: Medicare Other | Admitting: Physical Therapy

## 2016-10-27 ENCOUNTER — Other Ambulatory Visit: Payer: Self-pay | Admitting: Urology

## 2016-10-27 ENCOUNTER — Ambulatory Visit: Payer: Medicare Other | Admitting: Physical Therapy

## 2016-10-27 DIAGNOSIS — H353132 Nonexudative age-related macular degeneration, bilateral, intermediate dry stage: Secondary | ICD-10-CM | POA: Diagnosis not present

## 2016-10-27 DIAGNOSIS — H26492 Other secondary cataract, left eye: Secondary | ICD-10-CM | POA: Diagnosis not present

## 2016-10-27 DIAGNOSIS — H52203 Unspecified astigmatism, bilateral: Secondary | ICD-10-CM | POA: Diagnosis not present

## 2016-10-27 DIAGNOSIS — H01001 Unspecified blepharitis right upper eyelid: Secondary | ICD-10-CM | POA: Diagnosis not present

## 2016-10-31 ENCOUNTER — Encounter: Payer: Self-pay | Admitting: Physical Therapy

## 2016-10-31 ENCOUNTER — Ambulatory Visit: Payer: Medicare Other | Admitting: Physical Therapy

## 2016-10-31 DIAGNOSIS — M6281 Muscle weakness (generalized): Secondary | ICD-10-CM | POA: Diagnosis not present

## 2016-10-31 DIAGNOSIS — R2681 Unsteadiness on feet: Secondary | ICD-10-CM | POA: Diagnosis not present

## 2016-10-31 NOTE — Therapy (Signed)
[facility] [address], [place], [address] Phone: [phone]   Fax:  [phone]  Physical Therapy Treatment  Patient Details  Name: Joshua [name] MRN: [id number] Date of Birth: 04/28/1932 Referring Provider: Redge Gainer MD  Encounter Date: 10/31/2016      PT End of Session - 10/31/16 1311    Visit Number 8   Number of Visits 16   Date for PT Re-Evaluation 11/19/16   PT Start Time 1233   PT Stop Time 1314   PT Time Calculation (min) 41 min   Activity Tolerance Patient tolerated treatment well   Behavior During Therapy [facility] for tasks assessed/performed      Past Medical History:  Diagnosis Date  . Allergy    pcn  . Arteriovenous malformation    (CNS)  . Blood transfusion    right eye  no surgery as yet  . Bradycardia   . CAD (coronary artery disease)    a. 04/27/2016 PCI with DES to RCA with 50% ostial to 60% segmental mid to distal left main, and 90% thrombus filled ostial to proximal circumflex, with distal right coronary filled by collaterals from left-to-right  . Constipation   . ED (erectile dysfunction)   . GERD (gastroesophageal reflux disease)   . Hypercholesterolemia   . Hypertension   . Muscle weakness   . Prostate cancer ([place]) 11/02/11 bxs   Adenocarcinoma,Gleason=3+3=6 & 3+4=7,PSA+1.58 Volume=19cc  . Prostate cancer ([place])   . Seizures ([place])   . STEMI (ST elevation myocardial infarction) ([place])   . Vitamin D deficiency     Past Surgical History:  Procedure Laterality Date  . ABDOMINAL SURGERY    . APPENDECTOMY    . CARDIAC CATHETERIZATION N/A 04/27/2016   Procedure: Left Heart Cath and Coronary Angiography;  Surgeon: Belva Crome, MD;  Location: [place] [facility];  Service: Cardiovascular;  Laterality: N/A;  . CARDIAC CATHETERIZATION N/A 04/27/2016   Procedure: Temporary Pacemaker;  Surgeon: Belva Crome, MD;  Location: [place] [facility];  Service: Cardiovascular;  Laterality: N/A;  . CARDIAC  CATHETERIZATION N/A 04/27/2016   Procedure: Coronary Stent Intervention;  Surgeon: Belva Crome, MD;  Location: [place] [facility];  Service: Cardiovascular;  Laterality: N/A;  Proximal RCA Mid RCA  . CORONARY STENT PLACEMENT    . CRANIOTOMY  1980  . CYSTOSCOPY  01/19/2012   Procedure: CYSTOSCOPY;  Surgeon: Malka So, MD;  Location: [facility];  Service: Urology;;  no seeds found in bladder  . CYSTOSCOPY W/ RETROGRADES Bilateral 04/19/2016   Procedure: CYSTOSCOPY WITH BILATERAL RETROGRADE PYELOGRAM TRANSURETHRAL RESECTION OF BLADDER TUMOR ;  Surgeon: [name], MD;  [facility];  Service: Urology;  Laterality: Bilateral;  . RADIOACTIVE SEED IMPLANT  01/19/2012   Procedure: RADIOACTIVE SEED IMPLANT;  Surgeon: Malka So, MD;  Location: [facility];  Service: Urology;  Laterality: N/A;  68 seeds implanted    There were no vitals filed for this visit.      Subjective Assessment - 10/31/16 1237    Subjective Patient reported doing well after last treatment and no falls reported   Pertinent History Focal seizures many years ago affecting his right LE.   Patient Stated Goals Walk better and not lose balance.   Currently in Pain? No/denies            [facility] PT Assessment - 10/31/16 0001      Berg Balance Test   Sit to Stand Able  to stand  independently using hands   Standing Unsupported Able to stand 2 minutes with supervision   Sitting with Back Unsupported but Feet Supported on Floor or Stool Able to sit safely and securely 2 minutes   Stand to Sit Sits safely with minimal use of hands   Transfers Able to transfer safely, definite need of hands   Standing Unsupported with Eyes Closed Able to stand 10 seconds safely   Standing Ubsupported with Feet Together Able to place feet together independently and stand 1 minute safely   From Standing, Reach Forward with Outstretched Arm Can reach confidently >25 cm (10")   From Standing Position, Pick up Object  from Floor Able to pick up shoe safely and easily   From Standing Position, Turn to Look Behind Over each Shoulder Looks behind from both sides and weight shifts well   Turn 360 Degrees Able to turn 360 degrees safely one side only in 4 seconds or less   Standing Unsupported, Alternately Place Feet on Step/Stool Able to stand independently and complete 8 steps >20 seconds   Standing Unsupported, One Foot in Front Able to take small step independently and hold 30 seconds   Standing on One Leg Able to lift leg independently and hold equal to or more than 3 seconds   Total Score 47                     [facility] Adult PT Treatment/Exercise - 10/31/16 0001      Knee/Hip Exercises: Aerobic   Nustep Level 5 x 5mn     Knee/Hip Exercises: Machines for Strengthening   Cybex Knee Extension 10# 3x10   Cybex Knee Flexion 30# x30             Balance Exercises - 10/31/16 1305      Balance Exercises: Standing   Standing Eyes Opened Narrow base of support (BOS);Wide (BOA);Solid surface;Foam/compliant surface  several attempts   Standing Eyes Closed Wide (BOA);2 reps;Time   Standing, One Foot on a Step Eyes open;6 inch;4 reps   Rockerboard Anterior/posterior  213m   Step Ups 6 inch;UE support 1;Forward  2x10   Partial Tandem Stance Eyes open;2 reps   Sidestepping Foam/compliant support;4 reps   Sit to Stand Time x10             PT Short Term Goals - 10/31/16 1314      PT SHORT TERM GOAL #1   Title Berg score= 46/56.   Time 4   Period Weeks   Status Achieved  47/56 10/31/16           PT Long Term Goals - 10/31/16 1314      PT LONG TERM GOAL #1   Title Berg score= 49-50/56.   Time 8   Period Weeks   Status On-going     PT LONG TERM GOAL #2   Title Independent with a HEP.   Time 8   Period Weeks   Status On-going               Plan - 10/31/16 1316    Clinical Impression Statement Patient tolerated treatment well today and improved balance with  increased BERG score to 47/56. Patient O2 decreased to 90% after 156mon nustep yet increased back up to 95% after cues for deep breathing cues and rest. Patient required SBA / CGA for safety. Patient had LOB with balance exercises yet able to recover with paralel bar assistance. STG met and  LTG's ongoing due to balance deficts.    Rehab Potential Good   Clinical Impairments Affecting Rehab Potential monitor O2%   PT Frequency 2x / week   PT Duration 8 weeks   PT Treatment/Interventions ADLs/Self Care Home Management;Gait training;Functional mobility training;Therapeutic activities;Therapeutic exercise;Balance training;Neuromuscular re-education;Patient/family education   PT Next Visit Plan cont with POC for right LE strength/ankle isolator/sit to stand and other balance activities (resissted walking) per MPT   Consulted and Agree with Plan of Care Patient      Patient will benefit from skilled therapeutic intervention in order to improve the following deficits and impairments:  Abnormal gait, Decreased activity tolerance, Decreased balance, Decreased coordination, Difficulty walking  Visit Diagnosis: Unsteadiness on feet  Muscle weakness (generalized)     Problem List Patient Active Problem List   Diagnosis Date Noted  . Aortic atherosclerosis ([place]) 06/03/2016  . General weakness   . Protein-calorie malnutrition, severe ([place])   . Bradycardia 06/02/2016  . GI bleed 06/01/2016  . 1st degree AV block   . AV block, Mobitz 2 04/27/2016  . Old MI (myocardial infarction) 04/27/2016  . Bladder cancer ([place]) 04/18/2016  . Vitamin D deficiency 10/15/2015  . Prostate cancer ([place])   . Hypercholesterolemia   . Essential hypertension   . Seizures ([place])   . GERD 08/22/2008  . COLONIC POLYPS, ADENOMATOUS, HX OF 08/22/2008    Gardner Servantes P, PTA 10/31/2016, 1:23 PM  [facility] [place], [place], [address] Phone: [phone]    Fax:  [phone]  Name: Joshua [name] MRN: [id number] Date of Birth: [birth year]

## 2016-11-02 ENCOUNTER — Ambulatory Visit: Payer: Medicare Other | Admitting: Physical Therapy

## 2016-11-02 DIAGNOSIS — M6281 Muscle weakness (generalized): Secondary | ICD-10-CM

## 2016-11-02 DIAGNOSIS — R2681 Unsteadiness on feet: Secondary | ICD-10-CM

## 2016-11-02 NOTE — Therapy (Signed)
[facility] [place], [place], [address] Phone: [phone]   Fax:  [phone]  Physical Therapy Treatment  Patient Details  Name: Joshua [name] MRN: [id number] Date of Birth: 05-05-1932 Referring Provider: Redge Gainer MD  Encounter Date: 11/02/2016      PT End of Session - 11/02/16 1238    Visit Number 9   Number of Visits 16   Date for PT Re-Evaluation 11/19/16   PT Start Time 1234   PT Stop Time 1315   PT Time Calculation (min) 41 min   Activity Tolerance Patient tolerated treatment well   Behavior During Therapy [facility] for tasks assessed/performed      Past Medical History:  Diagnosis Date  . Allergy    pcn  . Arteriovenous malformation    ([place])  . Blood transfusion    right eye  no surgery as yet  . Bradycardia   . CAD (coronary artery disease)    a. 04/27/2016 PCI with DES to RCA with 50% ostial to 60% segmental mid to distal left main, and 90% thrombus filled ostial to proximal circumflex, with distal right coronary filled by collaterals from left-to-right  . Constipation   . ED (erectile dysfunction)   . GERD (gastroesophageal reflux disease)   . Hypercholesterolemia   . Hypertension   . Muscle weakness   . Prostate cancer ([place]) 11/02/11 bxs   Adenocarcinoma,Gleason=3+3=6 & 3+4=7,PSA+1.58 Volume=19cc  . Prostate cancer ([place])   . Seizures ([place])   . STEMI (ST elevation myocardial infarction) ([place])   . Vitamin D deficiency     Past Surgical History:  Procedure Laterality Date  . ABDOMINAL SURGERY    . APPENDECTOMY    . CARDIAC CATHETERIZATION N/A 04/27/2016   Procedure: Left Heart Cath and Coronary Angiography;  Surgeon: Belva Crome, MD;  Location: [place] [facility];  Service: Cardiovascular;  Laterality: N/A;  . CARDIAC CATHETERIZATION N/A 04/27/2016   Procedure: Temporary Pacemaker;  Surgeon: Belva Crome, MD;  Location: [place] [facility];  Service: Cardiovascular;  Laterality: N/A;  . CARDIAC  CATHETERIZATION N/A 04/27/2016   Procedure: Coronary Stent Intervention;  Surgeon: Belva Crome, MD;  Location: [place] [facility];  Service: Cardiovascular;  Laterality: N/A;  Proximal RCA Mid RCA  . CORONARY STENT PLACEMENT    . CRANIOTOMY  1980  . CYSTOSCOPY  01/19/2012   Procedure: CYSTOSCOPY;  Surgeon: Malka So, MD;  Location: [facility];  Service: Urology;;  no seeds found in bladder  . CYSTOSCOPY W/ RETROGRADES Bilateral 04/19/2016   Procedure: CYSTOSCOPY WITH BILATERAL RETROGRADE PYELOGRAM TRANSURETHRAL RESECTION OF BLADDER TUMOR ;  Surgeon: [name], MD;  [facility];  Service: Urology;  Laterality: Bilateral;  . RADIOACTIVE SEED IMPLANT  01/19/2012   Procedure: RADIOACTIVE SEED IMPLANT;  Surgeon: Malka So, MD;  Location: [facility];  Service: Urology;  Laterality: N/A;  68 seeds implanted    There were no vitals filed for this visit.      Subjective Assessment - 11/02/16 1236    Subjective Pt. doing well today without complaint or pain.     Patient Stated Goals Walk better and not lose balance.   Currently in Pain? No/denies   Multiple Pain Sites No             [facility] Adult PT Treatment/Exercise - 11/02/16 1304      Transfers   Transfers Sit to Stand   Sit to Stand 5: Supervision  Sit to Stand Details (indicate cue type and reason) sit<>stand from mat table 2 x 10 reps; 2nd set with blue airex pad under L foot   to promote R wt. shift      Neuro Re-ed    Neuro Re-ed Details  in parallel bars: side-stepping on blue foam beam with light UE support x 2 laps down back; tandem walk forward/backward with light UE support x 4 laps down back   sitting rest breaks with this x 2 reps     Knee/Hip Exercises: Aerobic   Nustep Level 6 x 67min     Knee/Hip Exercises: Standing   Other Standing Knee Exercises 8" toe taps while standing on blue airex pad 2 x 10 reps each leg; in parralel bars;    Other Standing Knee Exercises --            PT Short Term Goals - 10/31/16 1314      PT SHORT TERM GOAL #1   Title Berg score= 46/56.   Time 4   Period Weeks   Status Achieved  47/56 10/31/16           PT Long Term Goals - 10/31/16 1314      PT LONG TERM GOAL #1   Title Berg score= 49-50/56.   Time 8   Period Weeks   Status On-going     PT LONG TERM GOAL #2   Title Independent with a HEP.   Time 8   Period Weeks   Status On-going               Plan - 11/02/16 1239    Clinical Impression Statement Pt. tolerating all balance and hip strengthening activity well today with focus on R LE strengthening.  Pt. requiring increased time today following all standing activity for rest breaks due to R LE weakness.  Pt. able to progress to toe-touch to 8" step on airex pad today with light UE support.  Pt. confirming appropriate challenge level with standing balance activities today.  Pt. will continues to benefit from further standing hip strengthening and balance training to improve LE strength and safety.   PT Treatment/Interventions ADLs/Self Care Home Management;Gait training;Functional mobility training;Therapeutic activities;Therapeutic exercise;Balance training;Neuromuscular re-education;Patient/family education   PT Next Visit Plan G-code 10th visits; FOTO; cont with POC for right LE strength/ankle isolator/sit to stand and other balance activities (resissted walking) per MPT      Patient will benefit from skilled therapeutic intervention in order to improve the following deficits and impairments:  Abnormal gait, Decreased activity tolerance, Decreased balance, Decreased coordination, Difficulty walking  Visit Diagnosis: Unsteadiness on feet  Muscle weakness (generalized)     Problem List Patient Active Problem List   Diagnosis Date Noted  . Aortic atherosclerosis ([place]) 06/03/2016  . General weakness   . Protein-calorie malnutrition, severe ([place])   . Bradycardia 06/02/2016  . GI bleed  06/01/2016  . 1st degree AV block   . AV block, Mobitz 2 04/27/2016  . Old MI (myocardial infarction) 04/27/2016  . Bladder cancer ([place]) 04/18/2016  . Vitamin D deficiency 10/15/2015  . Prostate cancer ([place])   . Hypercholesterolemia   . Essential hypertension   . Seizures ([place])   . GERD 08/22/2008  . COLONIC POLYPS, ADENOMATOUS, HX OF 08/22/2008    Bess Harvest, PTA 11/02/16 4:27 PM  [facility] [place], [place], [address] Phone: [phone]   Fax:  [phone]  Name: LEFTY WOJICK MRN: [id number] Date  of Birth: 07/27/1932

## 2016-11-03 DIAGNOSIS — H26492 Other secondary cataract, left eye: Secondary | ICD-10-CM | POA: Diagnosis not present

## 2016-11-04 ENCOUNTER — Encounter (HOSPITAL_BASED_OUTPATIENT_CLINIC_OR_DEPARTMENT_OTHER): Payer: Self-pay | Admitting: *Deleted

## 2016-11-04 NOTE — Progress Notes (Signed)
SPOKE W/ PT'S SON.  NPO AFTER MN.  ARRIVE AT 0700.  NEEDS ISTAT 8.  CURRENT EKG IN CHART AND EPIC.  WILL TAKE AM MEDS DOS W/ SIPS OF WATER WITH EXCEPTION NO SPIRONOLACTONE.  PER DR WRENN PT TO CONTINUE ASA AND PLAVIX, PT SON STATED WAS GIVEN INSTRUCTIONS FROM OFFICE NOT TO STOP.

## 2016-11-08 ENCOUNTER — Ambulatory Visit: Payer: Medicare Other | Admitting: Physical Therapy

## 2016-11-08 DIAGNOSIS — R2681 Unsteadiness on feet: Secondary | ICD-10-CM | POA: Diagnosis not present

## 2016-11-08 DIAGNOSIS — M6281 Muscle weakness (generalized): Secondary | ICD-10-CM

## 2016-11-08 NOTE — Therapy (Signed)
[facility] [place], [place], [address] Phone: [phone]   Fax:  [phone]  Physical Therapy Treatment  Patient Details  Name: Joshua [name] MRN: [id number] Date of Birth: 1932/01/11 Referring Provider: Redge Gainer MD  Encounter Date: 11/08/2016      PT End of Session - 11/08/16 1257    Visit Number 10   Number of Visits 16   Date for PT Re-Evaluation 11/19/16   PT Start Time 1238  pt arrived late   PT Stop Time 1312   PT Time Calculation (min) 34 min   Activity Tolerance Patient tolerated treatment well   Behavior During Therapy [facility] for tasks assessed/performed      Past Medical History:  Diagnosis Date  . 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 Arthritis   . Bilateral lower extremity edema   . Bladder tumor   . CAD (coronary artery disease) cardiologist--  dr Daneen Schick   a. 04/27/2016 PCI with DES to RCA with 50% ostial to 60% segmental mid to distal left main, and 90% thrombus filled ostial to proximal circumflex, with distal right coronary filled by collaterals from left-to-right  . COPD with emphysema ([place])   . Dyspnea on exertion   . ED (erectile dysfunction)   . First degree AV block   . GERD (gastroesophageal reflux disease)   . History of adenomatous polyp of colon    09-22-2008  . History of bladder cancer    s/p  resection bladder tumor 04-19-2016  non-invasive low-grade urothelial carcinoma  . History of prostate cancer urologist-  dr Jeffie Pollock-- last PSA 0.02 (summer 2017)   dx 02/ 2013-- Stage T2a, Gleason 7, PSA 1.58--  s/p  radioactive prostate seed implants 01-19-2012  . History of ST elevation myocardial infarction (STEMI)    04-27-2016  . Hyperlipidemia   . Hypertension   . S/P drug eluting coronary stent placement    04-27-2016   . Second degree Mobitz I AV block    occasional w/ first degree heart block  per cardiologist note by  dr Daneen Schick  . Seizures ([place]) per pt son -- no seizure's since 1980   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  arteriorvenous cerebral malformation s/p  craniotomy 1980  . Vitamin D deficiency     Past Surgical History:  Procedure Laterality Date  . APPENDECTOMY  2011  . BLEPHAROPLASTY Bilateral revision 02-17-2016  . CARDIAC CATHETERIZATION N/A 04/27/2016   Procedure: Left Heart Cath and Coronary Angiography;  Surgeon: Belva Crome, MD;  Location: [place] [facility];  Service: Cardiovascular;  Laterality: N/A;  acute inferior wall STEMI ;  ostCx to midCx 90%,  ost1st Mrg to 1st Mrg 50%,  mid to distal RCA 100%,  pRCA 80%,  LVEF 45-50% w/ inferobasal akinesis  . CARDIAC CATHETERIZATION N/A 04/27/2016   Procedure: Temporary Pacemaker;  Surgeon: Belva Crome, MD;  Location: [place] [facility];  Service: Cardiovascular;  Laterality: N/A;  mobitz 2  second degree HB w/ heart rate 29bpm  . CARDIAC CATHETERIZATION N/A 04/27/2016   Procedure: Coronary Stent Intervention;  Surgeon: Belva Crome, MD;  Location: [place] [facility];  Service: Cardiovascular;  Laterality: N/A; DES x1 to  Proximal RCA;  DES x1 to Mid RCA  . CATARACT EXTRACTION W/ INTRAOCULAR LENS  IMPLANT, BILATERAL  2016  approx.  Marland Kitchen CRANIOTOMY  1980   intracranial bleed from arteriorvenous malformation (left  parietal)  . CYSTOSCOPY  01/19/2012   Procedure: CYSTOSCOPY;  Surgeon: Malka So, MD;  [facility];  Service: Urology;;  no seeds found in bladder  . CYSTOSCOPY W/ RETROGRADES Bilateral 04/19/2016   Procedure: CYSTOSCOPY WITH BILATERAL RETROGRADE PYELOGRAM TRANSURETHRAL RESECTION OF BLADDER TUMOR ;  Surgeon: [name], MD;  [facility];  Service: Urology;  Laterality: Bilateral;  . RADIOACTIVE SEED IMPLANT  01/19/2012   Procedure: RADIOACTIVE SEED IMPLANT;  Surgeon: Malka So, MD;  Location[facility];  Service: Urology;  Laterality: N/A;  68 seeds implanted  . TRANSTHORACIC  ECHOCARDIOGRAM  06/02/2016   ef 60-65%/  trivial MR/  mild TR    There were no vitals filed for this visit.      Subjective Assessment - 11/08/16 1239    Subjective no complaints, no pain or falls to report   Pertinent History Focal seizures many years ago affecting his right LE.   Patient Stated Goals Walk better and not lose balance.   Currently in Pain? No/denies                         [facility] Adult PT Treatment/Exercise - 11/08/16 1239      Knee/Hip Exercises: Aerobic   Nustep Level 6 x 69min     Knee/Hip Exercises: Standing   Hip Flexion Both;10 reps;Knee bent   Hip Flexion Limitations 3#; alternating with bil UE support   Hip Abduction Both;10 reps;Knee straight   Abduction Limitations 3#; alternating with bil UE support   Hip Extension Both;10 reps;Knee straight   Extension Limitations 3#; alternating with bil UE support     Knee/Hip Exercises: Seated   Long Arc Quad 10 reps;Strengthening;Both;2 sets   Long Arc Quad Weight 3 lbs.   Long [employer] Limitations increased difficulty with RLE   Sit to [employer] 2 sets;10 reps;without UE support                  PT Short Term Goals - 10/31/16 1314      PT SHORT TERM GOAL #1   Title Berg score= 46/56.   Time 4   Period Weeks   Status Achieved  47/56 10/31/16           PT Long Term Goals - 10/31/16 1314      PT LONG TERM GOAL #1   Title Berg score= 49-50/56.   Time 8   Period Weeks   Status On-going     PT LONG TERM GOAL #2   Title Independent with a HEP.   Time 8   Period Weeks   Status On-going               Plan - 11/08/16 1258    Clinical Impression Statement Pt tolerated all exercises well today, needs min cues for proper technique.  No rest breaks needed today, and will continue to benefit from PT to maximize function.   PT Treatment/Interventions ADLs/Self Care Home Management;Gait training;Functional mobility training;Therapeutic activities;Therapeutic  exercise;Balance training;Neuromuscular re-education;Patient/family education   PT Next Visit Plan cont with POC for right LE strength/ankle isolator/sit to stand and other balance activities (resissted walking) per MPT   Consulted and Agree with Plan of Care Patient      Patient will benefit from skilled therapeutic intervention in order to improve the following deficits and impairments:  Abnormal gait, Decreased activity tolerance, Decreased balance, Decreased coordination, Difficulty walking  Visit Diagnosis: Unsteadiness on feet  Muscle weakness (generalized)       G-Codes - 11/25/16 1259    Functional Assessment Tool Used Clinical judgement...   Functional Limitation Mobility: Walking and moving around   Mobility: Walking and Moving Around Current Status [phone]) At least 20 percent but less than 40 percent impaired, limited or restricted   Mobility: Walking and Moving Around Goal Status [phone]) At least 1 percent but less than 20 percent impaired, limited or restricted      Problem List Patient Active Problem List   Diagnosis Date Noted  . Aortic atherosclerosis ([place]) 06/03/2016  . General weakness   . Protein-calorie malnutrition, severe ([place])   . Bradycardia 06/02/2016  . GI bleed 06/01/2016  . 1st degree AV block   . AV block, Mobitz 2 04/27/2016  . Old MI (myocardial infarction) 04/27/2016  . Bladder cancer ([place]) 04/18/2016  . Vitamin D deficiency 10/15/2015  . Prostate cancer ([place])   . Hypercholesterolemia   . Essential hypertension   . Seizures ([place])   . GERD 08/22/2008  . COLONIC POLYPS, ADENOMATOUS, HX OF 08/22/2008      Laureen Abrahams, PT, DPT 11/25/2016 1:03 PM    [facility] [address] [place], [place], [address] Phone: [phone]   Fax:  [phone]  Name: Joshua [name] MRN: [id number] Date of Birth: 1931/11/04       Physical Therapy Progress Note  Dates of Reporting Period: 09/20/16  to 2016-11-25  Objective Reports of Subjective Statement: see above  Objective Measurements: BERG  Goal Update: see above  Plan: continue per current POC    Reason Skilled Services are Required: to maximize function and decrease fall risk   Laureen Abrahams, PT, DPT 11/25/2016 1:04 PM  [facility] [address] [place], [place] [address]  [phone] (office) [phone] (fax)

## 2016-11-09 NOTE — H&P (Signed)
CC/HPI: History of bladder cancer.   [name] returns today in f/u for cystoscopy. He had a resection of large bladder tumor with instillation of epirubicin on 04/19/16. Pathology was positive for urothelial carcinoma that was non-invasive and primarily low grade but with some high grade features. He is voiding well without hematuria or residual urgency. His UA is clear today. Cystoscopy today shows 2 lesions consistent with recurrent disease on the left lateral wall.   He has a history of prostate cancer with a seed implant about 5 years ago and his last PSA in the summer was 0.02.   He has no associated signs or symptoms.     ALLERGIES: Penicillins - rash    MEDICATIONS: Aspir 81  Atorvastatin Calcium 80 mg tablet  Calcium 500 TABS Oral  Celexa  Dilantin 100 MG Oral Capsule Oral  Hydrocodone-Acetaminophen 5 mg-325 mg tablet  PHENobarbital 32.4 MG Oral Tablet Oral  Plavix 75 MG Oral Tablet Oral  Pravastatin Sodium 80 MG Oral Tablet Oral  Preservision Areds  Protonix 40 MG Oral Tablet Delayed Release Oral  Ranitidine Hcl 300 mg capsule  Spironolactone 25 mg tablet  Vitamin D TABS Oral     GU PSH: Bladder Instill AntiCA Agent - 04/19/2016 Cystoscopy - 07/20/2016, 04/06/2016 Cystoscopy TURBT >5 cm - 04/19/2016 TRANSPERI NEEDLE PLACE, PROS - 2013      PSH Notes: Blepharoplasty Upper Lid W/ Excessive Skin, Cataract Surgery, Surgery Prostate Transperineal Placement Of Needles, Craniotomy (Therapeutic), Appendectomy   NON-GU PSH: Appendectomy - 2013 Revise Upper Eyelid - 02/17/2016    GU PMH: Bladder Cancer, History - 07/20/2016 Bladder Cancer Lateral, He has a multifocal UCa on the left lateral wall that is about 2.5-3cm and appears superficial. - 04/06/2016 Gross hematuria (Improving), He has had no further bleeding. - 04/06/2016, Gross hematuria, - 02/17/2016 Prostate Cancer, History (Stable), His recent PSA remains very low. - 04/06/2016, History of prostate cancer, - 02/17/2016,  History of malignant neoplasm of prostate, - 02/12/2015 Urinary Retention, Unspec, Incomplete bladder emptying - 02/12/2015 Nodular prostate w/ LUTS, Nodular prostate with lower urinary tract symptoms - 2016 Urinary Urgency, Urinary urgency - 2015      PMH Notes:  2011-09-27 15:39:05 - Note: Arteriovenous Malformation (CNS)  2011-09-27 15:39:05 - Note: Seizure   NON-GU PMH: Personal history of other specified conditions, History of urinary frequency - 02/12/2015 Encounter for general adult medical examination without abnormal findings, Encounter for preventive health examination - 2016 Personal history of other diseases of the circulatory system, History of hypertension - 2014 Personal history of other diseases of the digestive system, History of esophageal reflux - 2014 Personal history of other endocrine, nutritional and metabolic disease, History of hypercholesterolemia - 2014 Myocardial Infarction    FAMILY HISTORY: Death In The Family Father - Runs In Family Death In The Family Mother - Runs In Family Emphysema - Father Family Health Status Number - Runs In Family Stroke Syndrome - Mother   SOCIAL HISTORY: Marital Status: Widowed Current Smoking Status: Patient does not smoke anymore. Has not smoked since 03/24/2016.  Patient's occupation Engineer, mining.     Notes: Tobacco use, Former smoker, Alcohol Use, Marital History - Widowed, Occupation:, Caffeine Use   REVIEW OF SYSTEMS:    GU Review Male:   Patient reports get up at night to urinate. Patient denies frequent urination, hard to postpone urination, burning/ pain with urination, leakage of urine, stream starts and stops, trouble starting your stream, have to strain to urinate , erection problems, and penile  pain.  Gastrointestinal (Upper):   Patient denies nausea, vomiting, and indigestion/ heartburn.  Gastrointestinal (Lower):   Patient denies diarrhea and constipation.  Constitutional:   Patient denies fever, night  sweats, weight loss, and fatigue.  Skin:   Patient denies skin rash/ lesion and itching.  Eyes:   Patient denies blurred vision and double vision.  Ears/ Nose/ Throat:   Patient denies sore throat and sinus problems.  Hematologic/Lymphatic:   Patient denies swollen glands and easy bruising.  Cardiovascular:   Patient denies leg swelling and chest pains.  Respiratory:   Patient denies cough and shortness of breath.  Endocrine:   Patient denies excessive thirst.  Musculoskeletal:   Patient denies back pain and joint pain.  Neurological:   Patient denies headaches and dizziness.  Psychologic:   Patient denies depression and anxiety.   VITAL SIGNS:      10/24/2016 12:51 PM  Weight 175 lb / 79.38 kg  Height 73 in / 185.42 cm  BP 145/76 mmHg  Heart Rate 60 /min  BMI 23.1 kg/m   MULTI-SYSTEM PHYSICAL EXAMINATION:    Constitutional: Well-nourished. No physical deformities. Normally developed. Good grooming.  Respiratory: No labored breathing, no use of accessory muscles. CTA  Cardiovascular: Normal temperature, RRR without murmur.      PAST DATA REVIEWED:  Source Of History:  Patient  Urine Test Review:   Urinalysis   PROCEDURES:         Flexible Cystoscopy - 52000  Risks, benefits, and some of the potential complications of the procedure were discussed. Cipro 500mg  given for antibiotic prophylaxis.     Meatus:  Normal size. Normal location. Normal condition.  Urethra:  No strictures.  External Sphincter:  Normal.  Verumontanum:  Normal.  Prostate:  Obstructing. Mild hyperplasia. 2-3cm  Bladder Neck:  Non-obstructing.  Ureteral Orifices:  Normal location. Normal size. Normal shape. Effluxed clear urine.  Bladder:  Mild trabeculation. A left lateral wall tumor. there are two velvety raised areas about 5-2mm in size that are suspicious for recurrent disease. Normal mucosa. No stones.      The procedure was well tolerated and there were no complications.         Urinalysis -  81003 Dipstick Dipstick Cont'd  Color: Yellow Bilirubin: Neg  Appearance: Clear Ketones: Neg  Specific Gravity: 1.010 Blood: Neg  pH: 6.0 Protein: Neg  Glucose: Neg Urobilinogen: 0.2    Nitrites: Neg    Leukocyte Esterase: Neg    Notes:      ASSESSMENT:      ICD-10 Details  1 GU:   Prostate Cancer, History - Z85.46 He is due for a PSA and will have one drawn today.  2   Bladder Cancer Lateral - C67.2 He appears to have two recurrent lesions on the left lateral wall and I will get him set up for cystoscopy with fulguration.    PLAN:           Orders Labs PSA, Urine Cytology          Schedule Labs: 3 Months - Urinalysis  Return Visit/Planned Activity: Next Available Appointment - Schedule Surgery          Document Letter(s):  Created for Patient: Clinical Summary         Notes:   He appears to have a recurrence on the left lateral wall in the area of the prior resection.   I will get him set up for biopsy with fulguration and reviewed the risks of bleeding, infection,  bladder wall injury, thrombotic events and anesthetic complications. He will need to stay on Plavix with his recent stent placement.   PSA today.

## 2016-11-10 ENCOUNTER — Ambulatory Visit (HOSPITAL_BASED_OUTPATIENT_CLINIC_OR_DEPARTMENT_OTHER): Payer: Medicare Other | Admitting: Anesthesiology

## 2016-11-10 ENCOUNTER — Ambulatory Visit (HOSPITAL_BASED_OUTPATIENT_CLINIC_OR_DEPARTMENT_OTHER)
Admission: RE | Admit: 2016-11-10 | Discharge: 2016-11-10 | Disposition: A | Discharge status: Home / Self Care | Payer: Medicare Other | Source: Ambulatory Visit | Attending: Urology | Admitting: Urology

## 2016-11-10 ENCOUNTER — Encounter (HOSPITAL_BASED_OUTPATIENT_CLINIC_OR_DEPARTMENT_OTHER)
Admission: RE | Disposition: A | Discharge status: Home / Self Care | Payer: Self-pay | Source: Ambulatory Visit | Attending: Urology

## 2016-11-10 ENCOUNTER — Encounter: Payer: Medicare Other | Admitting: Physical Therapy

## 2016-11-10 ENCOUNTER — Encounter (HOSPITAL_BASED_OUTPATIENT_CLINIC_OR_DEPARTMENT_OTHER): Payer: Self-pay | Admitting: *Deleted

## 2016-11-10 DIAGNOSIS — Z7982 Long term (current) use of aspirin: Secondary | ICD-10-CM | POA: Insufficient documentation

## 2016-11-10 DIAGNOSIS — E7800 Pure hypercholesterolemia, unspecified: Secondary | ICD-10-CM | POA: Diagnosis not present

## 2016-11-10 DIAGNOSIS — Z87891 Personal history of nicotine dependence: Secondary | ICD-10-CM | POA: Insufficient documentation

## 2016-11-10 DIAGNOSIS — J449 Chronic obstructive pulmonary disease, unspecified: Secondary | ICD-10-CM | POA: Diagnosis not present

## 2016-11-10 DIAGNOSIS — Z79899 Other long term (current) drug therapy: Secondary | ICD-10-CM | POA: Insufficient documentation

## 2016-11-10 DIAGNOSIS — I2510 Atherosclerotic heart disease of native coronary artery without angina pectoris: Secondary | ICD-10-CM | POA: Diagnosis not present

## 2016-11-10 DIAGNOSIS — C672 Malignant neoplasm of lateral wall of bladder: Secondary | ICD-10-CM | POA: Diagnosis present

## 2016-11-10 DIAGNOSIS — I252 Old myocardial infarction: Secondary | ICD-10-CM | POA: Diagnosis not present

## 2016-11-10 DIAGNOSIS — C61 Malignant neoplasm of prostate: Secondary | ICD-10-CM | POA: Diagnosis not present

## 2016-11-10 DIAGNOSIS — Z7902 Long term (current) use of antithrombotics/antiplatelets: Secondary | ICD-10-CM | POA: Diagnosis not present

## 2016-11-10 DIAGNOSIS — I10 Essential (primary) hypertension: Secondary | ICD-10-CM | POA: Insufficient documentation

## 2016-11-10 DIAGNOSIS — D090 Carcinoma in situ of bladder: Secondary | ICD-10-CM | POA: Insufficient documentation

## 2016-11-10 DIAGNOSIS — Z880 Allergy status to penicillin: Secondary | ICD-10-CM | POA: Diagnosis not present

## 2016-11-10 DIAGNOSIS — D494 Neoplasm of unspecified behavior of bladder: Secondary | ICD-10-CM | POA: Diagnosis not present

## 2016-11-10 DIAGNOSIS — K219 Gastro-esophageal reflux disease without esophagitis: Secondary | ICD-10-CM | POA: Insufficient documentation

## 2016-11-10 DIAGNOSIS — Z8546 Personal history of malignant neoplasm of prostate: Secondary | ICD-10-CM | POA: Insufficient documentation

## 2016-11-10 HISTORY — DX: Hyperlipidemia, unspecified: E78.5

## 2016-11-10 HISTORY — DX: Personal history of malignant neoplasm of bladder: Z85.51

## 2016-11-10 HISTORY — DX: Atrioventricular block, second degree: I44.1

## 2016-11-10 HISTORY — DX: Old myocardial infarction: I25.2

## 2016-11-10 HISTORY — DX: Personal history of colonic polyps: Z86.010

## 2016-11-10 HISTORY — DX: Dyspnea, unspecified: R06.00

## 2016-11-10 HISTORY — DX: Atrioventricular block, first degree: I44.0

## 2016-11-10 HISTORY — DX: Unspecified osteoarthritis, unspecified site: M19.90

## 2016-11-10 HISTORY — DX: Personal history of adenomatous and serrated colon polyps: Z86.0101

## 2016-11-10 HISTORY — DX: Emphysema, unspecified: J43.9

## 2016-11-10 HISTORY — PX: CYSTOSCOPY WITH BIOPSY: SHX5122

## 2016-11-10 HISTORY — DX: Personal history of malignant neoplasm of prostate: Z85.46

## 2016-11-10 HISTORY — DX: Localized edema: R60.0

## 2016-11-10 HISTORY — DX: Other forms of dyspnea: R06.09

## 2016-11-10 HISTORY — DX: Neoplasm of unspecified behavior of bladder: D49.4

## 2016-11-10 HISTORY — DX: Presence of coronary angioplasty implant and graft: Z95.5

## 2016-11-10 LAB — POCT I-STAT, CHEM 8
BUN: 19 mg/dL (ref 6–20)
CHLORIDE: 105 mmol/L (ref 101–111)
Calcium, Ion: 1.27 mmol/L (ref 1.15–1.40)
Creatinine, Ser: 0.8 mg/dL (ref 0.61–1.24)
GLUCOSE: 102 mg/dL — AB (ref 65–99)
HEMATOCRIT: 37 % — AB (ref 39.0–52.0)
HEMOGLOBIN: 12.6 g/dL — AB (ref 13.0–17.0)
POTASSIUM: 3.9 mmol/L (ref 3.5–5.1)
Sodium: 141 mmol/L (ref 135–145)
TCO2: 24 mmol/L (ref 0–100)

## 2016-11-10 SURGERY — CYSTOSCOPY, WITH BIOPSY
Anesthesia: General | Site: Bladder

## 2016-11-10 MED ORDER — NEOSTIGMINE METHYLSULFATE 5 MG/5ML IV SOSY
PREFILLED_SYRINGE | INTRAVENOUS | Status: AC
  Filled 2016-11-10: qty 5

## 2016-11-10 MED ORDER — PROPOFOL 500 MG/50ML IV EMUL
INTRAVENOUS | Status: DC | PRN
  Administered 2016-11-10: 100 ug/kg/min via INTRAVENOUS

## 2016-11-10 MED ORDER — ACETAMINOPHEN 10 MG/ML IV SOLN
INTRAVENOUS | Status: DC | PRN
  Administered 2016-11-10: 1000 mg via INTRAVENOUS

## 2016-11-10 MED ORDER — PROPOFOL 10 MG/ML IV BOLUS
INTRAVENOUS | Status: AC
  Filled 2016-11-10: qty 20

## 2016-11-10 MED ORDER — CIPROFLOXACIN IN D5W 400 MG/200ML IV SOLN
INTRAVENOUS | Status: AC
  Filled 2016-11-10: qty 200

## 2016-11-10 MED ORDER — CIPROFLOXACIN IN D5W 400 MG/200ML IV SOLN
400.0000 mg | Freq: Two times a day (BID) | INTRAVENOUS | Status: DC
  Administered 2016-11-10: 400 mg via INTRAVENOUS
  Filled 2016-11-10: qty 200

## 2016-11-10 MED ORDER — STERILE WATER FOR IRRIGATION IR SOLN
Status: DC | PRN
  Administered 2016-11-10: 3000 mL

## 2016-11-10 MED ORDER — EPHEDRINE 5 MG/ML INJ
INTRAVENOUS | Status: AC
  Filled 2016-11-10: qty 10

## 2016-11-10 MED ORDER — EPHEDRINE SULFATE-NACL 50-0.9 MG/10ML-% IV SOSY
PREFILLED_SYRINGE | INTRAVENOUS | Status: DC | PRN
  Administered 2016-11-10: 10 mg via INTRAVENOUS

## 2016-11-10 MED ORDER — ACETAMINOPHEN 10 MG/ML IV SOLN
INTRAVENOUS | Status: AC
  Filled 2016-11-10: qty 100

## 2016-11-10 MED ORDER — LIDOCAINE HCL 2 % EX GEL
CUTANEOUS | Status: DC | PRN
  Administered 2016-11-10: 1 via URETHRAL

## 2016-11-10 MED ORDER — LACTATED RINGERS IV SOLN
INTRAVENOUS | Status: DC
  Administered 2016-11-10: 08:00:00 via INTRAVENOUS
  Filled 2016-11-10: qty 1000

## 2016-11-10 MED ORDER — FENTANYL CITRATE (PF) 100 MCG/2ML IJ SOLN
INTRAMUSCULAR | Status: AC
  Filled 2016-11-10: qty 2

## 2016-11-10 MED ORDER — ONDANSETRON HCL 4 MG/2ML IJ SOLN
INTRAMUSCULAR | Status: AC
  Filled 2016-11-10: qty 2

## 2016-11-10 MED ORDER — PROPOFOL 500 MG/50ML IV EMUL
INTRAVENOUS | Status: AC
  Filled 2016-11-10: qty 50

## 2016-11-10 MED ORDER — FENTANYL CITRATE (PF) 100 MCG/2ML IJ SOLN
INTRAMUSCULAR | Status: DC | PRN
  Administered 2016-11-10 (×2): 25 ug via INTRAVENOUS

## 2016-11-10 MED ORDER — ONDANSETRON HCL 4 MG/2ML IJ SOLN
INTRAMUSCULAR | Status: DC | PRN
  Administered 2016-11-10: 4 mg via INTRAVENOUS

## 2016-11-10 SURGICAL SUPPLY — 24 items
BAG DRAIN URO-CYSTO SKYTR STRL (DRAIN) ×3 IMPLANT
CATH FOLEY 2WAY SLVR  5CC 16FR (CATHETERS)
CATH FOLEY 2WAY SLVR 5CC 16FR (CATHETERS) IMPLANT
CLOTH BEACON ORANGE TIMEOUT ST (SAFETY) ×3 IMPLANT
ELECT REM PT RETURN 9FT ADLT (ELECTROSURGICAL) ×3
ELECTRODE REM PT RTRN 9FT ADLT (ELECTROSURGICAL) ×1 IMPLANT
GLOVE BIO SURGEON STRL SZ 6.5 (GLOVE) ×2 IMPLANT
GLOVE BIO SURGEONS STRL SZ 6.5 (GLOVE) ×1
GLOVE INDICATOR 6.5 STRL GRN (GLOVE) ×3 IMPLANT
GLOVE SURG SS PI 8.0 STRL IVOR (GLOVE) ×3 IMPLANT
GOWN STRL REUS W/ TWL LRG LVL3 (GOWN DISPOSABLE) ×1 IMPLANT
GOWN STRL REUS W/TWL LRG LVL3 (GOWN DISPOSABLE) ×5 IMPLANT
GOWN STRL REUS W/TWL XL LVL3 (GOWN DISPOSABLE) ×3 IMPLANT
KIT RM TURNOVER CYSTO AR (KITS) ×3 IMPLANT
MANIFOLD NEPTUNE II (INSTRUMENTS) ×3 IMPLANT
NDL SAFETY ECLIPSE 18X1.5 (NEEDLE) IMPLANT
NEEDLE HYPO 18GX1.5 SHARP (NEEDLE)
NEEDLE HYPO 22GX1.5 SAFETY (NEEDLE) ×3 IMPLANT
NS IRRIG 500ML POUR BTL (IV SOLUTION) IMPLANT
PACK CYSTO (CUSTOM PROCEDURE TRAY) ×3 IMPLANT
SYR 20CC LL (SYRINGE) IMPLANT
TUBE CONNECTING 12'X1/4 (SUCTIONS) ×1
TUBE CONNECTING 12X1/4 (SUCTIONS) ×2 IMPLANT
WATER STERILE IRR 3000ML UROMA (IV SOLUTION) ×3 IMPLANT

## 2016-11-10 NOTE — Anesthesia Postprocedure Evaluation (Signed)
Anesthesia Post Note  Patient: Joshua [name]  Procedure(s) Performed: Procedure(s) (LRB): CYSTOSCOPY WITH BIOPSY AND FULGURATION (N/A)  Patient location during evaluation: PACU Anesthesia Type: General Level of consciousness: awake and alert Pain management: pain level controlled Vital Signs Assessment: post-procedure vital signs reviewed and stable Respiratory status: spontaneous breathing, nonlabored ventilation, respiratory function stable and patient connected to nasal cannula oxygen Cardiovascular status: blood pressure returned to baseline and stable Postop Assessment: no signs of nausea or vomiting Anesthetic complications: no       Last Vitals:  Vitals:   11/10/16 1000 11/10/16 1015  BP: (!) 123/52 (!) 124/59  Pulse: 65 (!) 57  Resp: 16 11  Temp:      Last Pain:  Vitals:   11/10/16 1000  TempSrc:   PainSc: 0-No pain                 Gerrianne Aydelott DAVID

## 2016-11-10 NOTE — Brief Op Note (Signed)
11/10/2016  9:20 AM  PATIENT:  Joshua [name]  83 y.o. male  PRE-OPERATIVE DIAGNOSIS:  bladder tumor left lateral wall 2cm  POST-OPERATIVE DIAGNOSIS:  bladder tumor left lateral wall 2cm  PROCEDURE:  Procedure(s): CYSTOSCOPY WITH BIOPSY AND FULGURATION (N/A) 2cm lesion  SURGEON:  Surgeon(s) and Role:    * [name], MD - Primary  PHYSICIAN ASSISTANT:   ASSISTANTS: none   ANESTHESIA:   MAC  EBL:  Total I/O In: 100 [I.V.:100] Out: -   BLOOD ADMINISTERED:none  DRAINS: none   LOCAL MEDICATIONS USED:  LIDOCAINE  and Amount: 10 ml 2% jelly  SPECIMEN:  Source of Specimen:  left bladder wall biopsy  DISPOSITION OF SPECIMEN:  PATHOLOGY  COUNTS:  YES  TOURNIQUET:  * No tourniquets in log *  DICTATION: .Other Dictation: Dictation Number [id number]  PLAN OF CARE: Discharge to home after PACU  PATIENT DISPOSITION:  PACU - hemodynamically stable.   Delay start of Pharmacological VTE agent (>24hrs) due to surgical blood loss or risk of bleeding: not applicable

## 2016-11-10 NOTE — Transfer of Care (Signed)
Immediate Anesthesia Transfer of Care Note  Patient: Joshua [name]  Procedure(s) Performed: Procedure(s) (LRB): CYSTOSCOPY WITH BIOPSY AND FULGURATION (N/A)  Patient Location: PACU  Anesthesia Type: MAC  Level of Consciousness: awake, sedated, patient cooperative and responds to stimulation  Airway & Oxygen Therapy: Patient Spontanous Breathing and Patient connected to face mask oxygen  Post-op Assessment: Report given to PACU RN, Post -op Vital signs reviewed and stable and Patient moving all extremities  Post vital signs: Reviewed and stable  Complications: No apparent anesthesia complications

## 2016-11-10 NOTE — Op Note (Signed)
NAMEISAHI, SCIACCA                ACCOUNT NO.:  [id number]  MEDICAL RECORD NO.:  [id number]  LOCATION:                                 FACILITY:  PHYSICIAN:  Marshall Cork. Jeffie Pollock, M.D.    DATE OF BIRTH:  1932/09/05  DATE OF PROCEDURE:  11/10/2016 DATE OF DISCHARGE:                              OPERATIVE REPORT   PROCEDURE:  Recurrent bladder tumor of the left lateral wall.  POSTOPERATIVE DIAGNOSIS:  Recurrent bladder tumor of the left lateral wall.  SURGEON:  Marshall Cork. Jeffie Pollock, M.D.  ANESTHESIA:  General.  SPECIMEN:  Bladder tumor biopsy.  DRAINS:  None.  BLOOD LOSS:  None.  COMPLICATIONS:  None.  INDICATIONS:  Mr. Vanweelden is an 83 year old white male with a history of bladder cancer on the left lateral wall, who was found to have recurrence around the scar at his most recent surveillance cystoscopy.  FINDINGS AND PROCEDURE:  He was taken to the operating room where he was given Cipro, IV sedation was induced, and he was placed in lithotomy position and fitted with PAS hose.  His perineum and genitalia were prepped with Betadine solution.  He was draped in usual sterile fashion and the urethra was instilled with 10 mL of 2% lidocaine jelly.  Cystoscopy was performed using a 23-French scope and 70-degree lens. Examination revealed a normal urethra.  The external sphincter was intact.  The prostatic urethra short without significant obstruction with some blanching and neovascularity.  Inspection of the bladder revealed mild trabeculation.  There was a small hemorrhagic lesion on the posterior wall that appeared benign.  The right ureteral orifice was unremarkable.  The left ureteral orifice was adjacent to, but not involved with the tumor on the left lateral wall.  There was a stellate scar with tumor surrounding the scar with a maximum diameter of approximately 2 cm.  No other mucosal lesions were identified.  A cup biopsy was used to biopsy the tumor on the left lateral  wall.  A Bugbee electrode was then used to fulgurate the biopsy site and the remaining tumor.  The area of fulguration was approximately 2 cm in diameter.  I then fulgurated the hemorrhagic lesion on the posterior wall just to be on the safe side.  The bladder was then drained.  The cystoscope was removed.  The patient was taken down from lithotomy position.  He was given IV Tylenol.  He was taken to recovery room in stable condition.  There were no complications.     Marshall Cork. Jeffie Pollock, M.D.   ______________________________ Marshall Cork. Jeffie Pollock, M.D.    JJW/MEDQ  D:  11/10/2016  T:  11/10/2016  Job:  [id number]

## 2016-11-10 NOTE — Discharge Instructions (Addendum)
Post Anesthesia Home Care Instructions  Activity: Get plenty of rest for the remainder of the day. A responsible adult should stay with you for 24 hours following the procedure.  For the next 24 hours, DO NOT: -Drive a car -Paediatric nurse -Drink alcoholic beverages -Take any medication unless instructed by your physician -Make any legal decisions or sign important papers.  Meals: Start with liquid foods such as gelatin or soup. Progress to regular foods as tolerated. Avoid greasy, spicy, heavy foods. If nausea and/or vomiting occur, drink only clear liquids until the nausea and/or vomiting subsides. Call your physician if vomiting continues.  Special Instructions/Symptoms: Your throat may feel dry or sore from the anesthesia or the breathing tube placed in your throat during surgery. If this causes discomfort, gargle with warm salt water. The discomfort should disappear within 24 hours.  If you had a scopolamine patch placed behind your ear for the management of post- operative nausea and/or vomiting:  1. The medication in the patch is effective for 72 hours, after which it should be removed.  Wrap patch in a tissue and discard in the trash. Wash hands thoroughly with soap and water. 2. You may remove the patch earlier than 72 hours if you experience unpleasant side effects which may include dry mouth, dizziness or visual disturbances. 3. Avoid touching the patch. Wash your hands with soap and water after contact with the patch.   Transurethral Resection of Bladder Tumor, Care After Refer to this sheet in the next few weeks. These instructions provide you with information about caring for yourself after your procedure. Your health care provider may also give you more specific instructions. Your treatment has been planned according to current medical practices, but problems sometimes occur. Call your health care provider if you have any problems or questions after your procedure. What  can I expect after the procedure? After the procedure, it is common to have:  A small amount of blood in your urine for up to 2 weeks.  Soreness or mild discomfort from your catheter. After your catheter is removed, you may have mild soreness, especially when urinating.  Pain in your lower abdomen. Follow these instructions at home:   Medicines  Take over-the-counter and prescription medicines only as told by your health care provider.  Do not drive or operate heavy machinery while taking prescription pain medicine.  Do not drive for 24 hours if you received a sedative.  If you were prescribed antibiotic medicine, take it as told by your health care provider. Do not stop taking the antibiotic even if you start to feel better. Activity  Return to your normal activities as told by your health care provider. Ask your health care provider what activities are safe for you.  Do not lift anything that is heavier than 10 lb (4.5 kg) for as long as told by your health care provider.  Avoid intense physical activity for as long as told by your health care provider.  Walk at least one time every day. This helps to prevent blood clots. You may increase your physical activity gradually as you start to feel better. General instructions  Do not drink alcohol for as long as told by your health care provider. This is especially important if you are taking prescription pain medicines.  Do not take baths, swim, or use a hot tub until your health care provider approves.  If you have a catheter, follow instructions from your health care provider about caring for your catheter and  your drainage bag.  Drink enough fluid to keep your urine clear or pale yellow.  Wear compression stockings as told by your health care provider. These stockings help to prevent blood clots and reduce swelling in your legs.  Keep all follow-up visits as told by your health care provider. This is important. Contact a  health care provider if:  You have pain that gets worse or does not improve with medicine.  You have blood in your urine for more than 2 weeks.  You have cloudy or bad-smelling urine.  You become constipated. Signs of constipation may include having:  Fewer than three bowel movements in a week.  Difficulty having a bowel movement.  Stools that are dry, hard, or larger than normal.  You have a fever. Get help right away if:  You have:  Severe pain.  Bright-red blood in your urine.  Blood clots in your urine.  A lot of blood in your urine.  Your catheter has been removed and you are not able to urinate.  You have a catheter in place and the catheter is not draining urine. This information is not intended to replace advice given to you by your health care provider. Make sure you discuss any questions you have with your health care provider. Document Released: 08/17/2015 Document Revised: 05/08/2016 Document Reviewed: 05/28/2015 Elsevier Interactive Patient Education  2017 [employer].  Here is some information about the BCG treatment that we will need to consider when you return in follow up.    Bacillus Calmette-Guerin Live, BCG intravesical solution What is this medicine? BACILLUS CALMETTE-GUERIN LIVE, BCG (ba SIL [place] KAL met gay RAYN) is a bacteria solution. This medicine stimulates the immune system to ward off cancer cells. It is used to treat bladder cancer. This medicine may be used for other purposes; ask your health care provider or pharmacist if you have questions. COMMON BRAND NAME(S): Theracys, TICE BCG What should I tell my health care provider before I take this medicine? They need to know if you have any of these conditions: -aneurysm -blood in the urine -bladder biopsy within 2 weeks -fever or infection -immune system problems -leukemia -lymphoma -myasthenia gravis -need organ transplant -prosthetic device like arterial graft, artificial joint,  prosthetic heart valve -recent or ongoing radiation therapy -tuberculosis -an unusual or allergic reaction to Bacillus Calmette-Guerin Live, BCG, latex, other medicines, foods, dyes, or preservatives -pregnant or trying to get pregnant -breast-feeding How should I use this medicine? This drug is given as a catheter infusion into the bladder. It is administered in a hospital or clinic by a specially trained health care professional. Dennis Bast will be given directions to follow before the treatment. Follow your doctor's directions carefully. Try to hold this medicine in your bladder for 2 hours after treatment. Talk to your pediatrician regarding the use of this medicine in children. Special care may be needed. Overdosage: If you think you have taken too much of this medicine contact a poison control center or emergency room at once. NOTE: This medicine is only for you. Do not share this medicine with others. What if I miss a dose? It is important not to miss your dose. Call your doctor or health care professional if you are unable to keep an appointment. What may interact with this medicine? -antibiotics -medicines to suppress your immune system like chemotherapy agents or corticosteroids -medicine to treat tuberculosis This list may not describe all possible interactions. Give your health care provider a list of all the medicines,  herbs, non-prescription drugs, or dietary supplements you use. Also tell them if you smoke, drink alcohol, or use illegal drugs. Some items may interact with your medicine. What should I watch for while using this medicine? Visit your doctor for checks on your progress. This drug may make you feel generally unwell. Contact your doctor if your symptoms last more than 2 days or if they get worse. Call your doctor right away if you have a severe or unusual symptom. Infection can be spread to others through contact with this medicine. To prevent the spread of infection follow  your doctor's directions carefully after treatment. For the first 6 hours after each treatment, sit down on the toilet to urinate. After urinating, add 2 cups of bleach to the toilet bowl and let set for 15 minutes before flushing. Wash your hands before and after using the restroom. Drink water or other fluids as directed after treatment with this medicine. Do not become pregnant while taking this medicine. Women should inform their doctor if they wish to become pregnant or think they might be pregnant. There is a potential for serious side effects to an unborn child. Talk to your health care professional or pharmacist for more information. Do not breast-feed an infant while taking this medicine. What side effects may I notice from receiving this medicine? Side effects that you should report to your doctor or health care professional as soon as possible: -allergic reactions like skin rash, itching or hives, swelling of the face, lips, or tongue -signs of infection - fever or chills, cough, sore throat, pain or difficulty passing urine -signs of decreased red blood cells - unusually weak or tired, fainting spells, lightheadedness -blood in urine -breathing problems -cough -eye pain, redness -flu-like symptoms -joint pain -bladder-area pain for more than 2 days after treatment -trouble passing urine or change in the amount of urine -vomiting -yellowing of the eyes or skin Side effects that usually do not require medical attention (report to your doctor or health care professional if they continue or are bothersome): -bladder spasm -burning when passing urine within 2 days of treatment -feel need to pass urine often or wake up at night to pass urine -loss of appetite This list may not describe all possible side effects. Call your doctor for medical advice about side effects. You may report side effects to FDA at 1-800-FDA-1088. Where should I keep my medicine? This drug is given in a hospital  or clinic and will not be stored at home. NOTE: This sheet is a summary. It may not cover all possible information. If you have questions about this medicine, talk to your doctor, pharmacist, or health care provider.  2017 Elsevier/Gold Standard (2015-10-08 10:33:35)

## 2016-11-10 NOTE — Transfer of Care (Deleted)
Immediate Anesthesia Transfer of Care Note  Patient: Joshua [name]  Procedure(s) Performed: Procedure(s) (LRB): CYSTOSCOPY WITH BIOPSY AND FULGURATION (N/A)  Patient Location: PACU  Anesthesia Type: General  Level of Consciousness: awake, sedated, patient cooperative and responds to stimulation  Airway & Oxygen Therapy: Patient Spontanous Breathing and Patient connected to FM O2  Post-op Assessment: Report given to PACU RN, Post -op Vital signs reviewed and stable and Patient moving all extremities  Post vital signs: Reviewed and stable  Complications: No apparent anesthesia complications

## 2016-11-10 NOTE — Anesthesia Preprocedure Evaluation (Addendum)
Anesthesia Evaluation  Patient identified by MRN, date of birth, ID band Patient awake    Reviewed: Allergy & Precautions, NPO status , Patient's Chart, lab work & pertinent test results  Airway Mallampati: I  TM Distance: >3 FB Neck [name]: Full    Dental  (+) Dental Advisory Given, Teeth Intact   Pulmonary COPD, former smoker,    Pulmonary exam normal        Cardiovascular Exercise Tolerance: Poor hypertension, Pt. on medications + CAD and + Past MI  Normal cardiovascular exam     Neuro/Psych    GI/Hepatic GERD  Medicated and Controlled,  Endo/Other    Renal/GU      Musculoskeletal  (+) Arthritis ,   Abdominal   Peds  Hematology   Anesthesia Other Findings   Reproductive/Obstetrics                           Anesthesia Physical Anesthesia Plan  ASA: III  Anesthesia Plan: MAC   Post-op Pain Management:    Induction: Intravenous  Airway Management Planned: Simple Face Mask  Additional Equipment:   Intra-op Plan:   Post-operative Plan:   Informed Consent: I have reviewed the patients History and Physical, chart, labs and discussed the procedure including the risks, benefits and alternatives for the proposed anesthesia with the patient or authorized representative who has indicated his/her understanding and acceptance.     Plan Discussed with: CRNA and Surgeon  Anesthesia Plan Comments:         Anesthesia Quick Evaluation

## 2016-11-10 NOTE — Progress Notes (Signed)
Cardiology Office Note    Date:  11/11/2016   ID:  [name], DOB [birth year], MRN [id number]  PCP:  Redge Gainer, MD  Cardiologist: Sinclair Grooms, MD   Chief Complaint  Patient presents with  . Coronary Artery Disease    History of Present Illness:  Joshua [name] is a 83 y.o. male history of sz, prostate CA, bladder CA HTN, HLD, GERD, recent STEMI 05/10/2016 w/ DES to RCA complicated by Mobitz II-->HR improved and PPM not indicated. Op Holter results pending who again admitted 9/13-9/16 for generalized weakness   He is back today for reassessment of blood pressure and volume overload after starting spironolactone. No side effects since starting the medication. Feels that his breathing is improved.   Past Medical History:  Diagnosis Date  . 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 Arthritis   . Bilateral lower extremity edema   . Bladder tumor   . CAD (coronary artery disease) cardiologist--  dr Daneen Schick   a. 04/27/2016 PCI with DES to RCA with 50% ostial to 60% segmental mid to distal left main, and 90% thrombus filled ostial to proximal circumflex, with distal right coronary filled by collaterals from left-to-right  . COPD with emphysema ([place])   . Dyspnea on exertion   . ED (erectile dysfunction)   . First degree AV block   . GERD (gastroesophageal reflux disease)   . History of adenomatous polyp of colon    09-22-2008  . History of bladder cancer    s/p  resection bladder tumor 04-19-2016  non-invasive low-grade urothelial carcinoma  . History of prostate cancer urologist-  dr Jeffie Pollock-- last PSA 0.02 (summer 2017)   dx 02/ 2013-- Stage T2a, Gleason 7, PSA 1.58--  s/p  radioactive prostate seed implants 01-19-2012  . History of ST elevation myocardial infarction (STEMI)    04-27-2016  . Hyperlipidemia   . Hypertension   . S/P drug eluting coronary stent placement    04-27-2016   . Second degree Mobitz I  AV block    occasional w/ first degree heart block  per cardiologist note by dr Daneen Schick  . Seizures ([place]) per pt son -- no seizure's since 1980   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  arteriorvenous cerebral malformation s/p  craniotomy 1980  . Vitamin D deficiency     Past Surgical History:  Procedure Laterality Date  . APPENDECTOMY  2011  . BLEPHAROPLASTY Bilateral revision 02-17-2016  . CARDIAC CATHETERIZATION N/A 04/27/2016   Procedure: Left Heart Cath and Coronary Angiography;  Surgeon: Belva Crome, MD;  Location: [facility];  Service: Cardiovascular;  Laterality: N/A;  acute inferior wall STEMI ;  ostCx to midCx 90%,  ost1st Mrg to 1st Mrg 50%,  mid to distal RCA 100%,  pRCA 80%,  LVEF 45-50% w/ inferobasal akinesis  . CARDIAC CATHETERIZATION N/A 04/27/2016   Procedure: Temporary Pacemaker;  Surgeon: Belva Crome, MD;  Location: [facility];  Service: Cardiovascular;  Laterality: N/A;  mobitz 2  second degree HB w/ heart rate 29bpm  . CARDIAC CATHETERIZATION N/A 04/27/2016   Procedure: Coronary Stent Intervention;  Surgeon: Belva Crome, MD;  Location: [facility];  Service: Cardiovascular;  Laterality: N/A; DES x1 to  Proximal RCA;  DES x1 to Mid RCA  . CATARACT EXTRACTION W/ INTRAOCULAR LENS  IMPLANT, BILATERAL  2016  approx.  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 CYSTOSCOPY  01/19/2012   Procedure: CYSTOSCOPY;  Surgeon: Malka So, MD;  Location: [facility];  Service: Urology;;  no seeds found in bladder  . CYSTOSCOPY W/ RETROGRADES Bilateral 04/19/2016   Procedure: CYSTOSCOPY WITH BILATERAL RETROGRADE PYELOGRAM TRANSURETHRAL RESECTION OF BLADDER TUMOR ;  Surgeon: [name], MD;  Location: [facility];  Service: Urology;  Laterality: Bilateral;  . CYSTOSCOPY WITH BIOPSY N/A 11/10/2016   Procedure: CYSTOSCOPY WITH BIOPSY AND FULGURATION;  Surgeon: [name], MD;  Location: [facility];  Service: Urology;  Laterality: N/A;  . RADIOACTIVE SEED IMPLANT  01/19/2012   Procedure: RADIOACTIVE SEED IMPLANT;  Surgeon: Malka So, MD;  Location: [facility];  Service: Urology;  Laterality: N/A;  68 seeds implanted  . TRANSTHORACIC ECHOCARDIOGRAM  06/02/2016   ef 60-65%/  trivial MR/  mild TR    Current Medications: Outpatient Medications Prior to Visit  Medication Sig Dispense Refill  . aspirin EC 81 MG tablet Take 1 tablet (81 mg total) by mouth daily. 90 tablet 3  . atorvastatin (LIPITOR) 20 MG tablet Take 1 tablet (20 mg total) by mouth daily. 90 tablet 3  . Cholecalciferol (VITAMIN D3) 1000 units CAPS Take 1 capsule by mouth daily.    . citalopram (CELEXA) 20 MG tablet Take 1 tablet (20 mg total) by mouth daily. 90 tablet 1  . clopidogrel (PLAVIX) 75 MG tablet Take 1 tablet (75 mg total) by mouth daily. 90 tablet 3  . Multiple Vitamins-Minerals (PRESERVISION AREDS 2) CAPS Take 1 capsule by mouth daily.    Marland Kitchen omeprazole (PRILOSEC) 20 MG capsule Take 20 mg by mouth daily.    Marland Kitchen PHENobarbital (LUMINAL) 64.8 MG tablet Take 1 tablet (64.8 mg total) by mouth 2 (two) times daily. 180 tablet 3  . phenytoin (DILANTIN) 100 MG ER capsule Take 200 mg by mouth 2 (two) times daily.     Marland Kitchen spironolactone (ALDACTONE) 25 MG tablet Take 0.5 tablets (12.5 mg total) by mouth daily. 45 tablet 3   No facility-administered medications prior to visit.      Allergies:   Penicillins   Social History   Social History  . Marital status: Widowed    Spouse name: N/A  . Number of children: 2  . Years of education: N/A   Occupational History  . Retired    Social History Main Topics  . Smoking status: Former Smoker    Packs/day: 2.00    Years: 15.00    Quit date: 01/15/1973  . Smokeless tobacco: Never Used  . Alcohol use Yes     Comment: occasionally  . Drug use: No  . Sexual activity: Not Asked   Other Topics Concern  . None   Social History Narrative    Went to [facility] after MI for rehab.     Family History:  The patient's family history includes Emphysema in his father and mother; Stroke in his mother.   ROS:   Please see the history of present illness.    Recent fall related to balance. No syncope.  All other systems reviewed and are negative.   PHYSICAL EXAM:   VS:  BP 134/64 (BP Location: Left Arm)   Pulse 73   Ht 6\' 1"  (1.854 m)   Wt 182 lb (82.6 kg)   BMI 24.01 kg/m    GEN: Well nourished, well developed, in no acute distress  HEENT: normal  Neck: no JVD, carotid bruits, or masses Cardiac: RRR; no murmurs, rubs, or gallops,no edema  Respiratory:  clear to auscultation bilaterally, normal work of breathing GI: soft, nontender, nondistended, + BS MS: no deformity or atrophy  Skin: warm and dry, no rash Neuro:  Alert and Oriented x 3, Strength and sensation are intact Psych: euthymic mood, full affect  Wt Readings from Last 3 Encounters:  11/11/16 182 lb (82.6 kg)  11/10/16 177 lb (80.3 kg)  10/10/16 183 lb 9.6 oz (83.3 kg)      Studies/Labs Reviewed:   EKG:  EKG  Not repeated  Recent Labs: 06/01/2016: B Natriuretic Peptide 107.1 07/08/2016: ALT 27; Platelets 403 11/10/2016: BUN 19; Creatinine, Ser 0.80; Hemoglobin 12.6; Potassium 3.9; Sodium 141   Lipid Panel    Component Value Date/Time   CHOL 165 07/08/2016 1226   TRIG 135 07/08/2016 1226   TRIG 85 01/20/2016 0958   HDL 74 07/08/2016 1226   HDL 69 01/20/2016 0958   CHOLHDL 2.2 07/08/2016 1226   CHOLHDL 3.3 04/27/2016 1133   VLDL 16 04/27/2016 1133   LDLCALC 64 07/08/2016 1226   LDLCALC 99 05/21/2014 1101    Additional studies/ records that were reviewed today include:  No new data.    ASSESSMENT:    1. Aortic atherosclerosis ([place])   2. AV block, Mobitz 2   3. Essential hypertension   4. Chronic diastolic heart failure ([place])   5. Old MI (myocardial infarction)      PLAN:  In order of problems listed above:  1. Denies ischemic  symptoms. We again discussed residual circumflex disease and my recommendation that we treat this medically unless he develops angina. Both he and his son or in agreement with this plan. 2. First-degree AV block previously noted on EKG one month ago. No clinical concerns to suggest worsening. 3. Blood pressure is much improved today after starting Aldactone. A BNP and basic metabolic panel will be done today to ensure no complications with renal function or potassium. 4. No evidence of volume overload other than 1+ bilateral ankle to mid shin edema. May be able to use more aggressive diuretic regimen but will wait to see electrolyte profile.  Overall clinical plan is to follow-up in 6 months. He is a note 5 recurrent chest discomfort, shortness of breath, or other cardiac concerned such as syncope.  Medication Adjustments/Labs and Tests Ordered: Current medicines are reviewed at length with the patient today.  Concerns regarding medicines are outlined above.  Medication changes, Labs and Tests ordered today are listed in the Patient Instructions below. Patient Instructions  Medication Instructions:  None  Labwork: BMET and BNP today  Testing/Procedures: None  Follow-Up: Your physician wants you to follow-up in: 6 months with Dr. Tamala Julian or a PA or NP on his team. You will receive a reminder letter in the mail two months in advance. If you don't receive a letter, please call our office to schedule the follow-up appointment.   Any Other Special Instructions Will Be Listed Below (If Applicable).     If you need a refill on your cardiac medications before your next appointment, please call your pharmacy.      Signed, Sinclair Grooms, MD  11/11/2016 11:57 AM    [facility] [place], [place], [place]  [address] Phone: [phone]; Fax: [phone]

## 2016-11-10 NOTE — Interval H&P Note (Signed)
History and Physical Interval Note:  11/10/2016 8:34 AM  Joshua [name]  has presented today for surgery, with the diagnosis of bladder tumor  The various methods of treatment have been discussed with the patient and family. After consideration of risks, benefits and other options for treatment, the patient has consented to  Procedure(s): CYSTOSCOPY WITH BIOPSY AND FULGURATION (N/A) as a surgical intervention .  The patient's history has been reviewed, patient examined, no change in status, stable for surgery.  I have reviewed the patient's chart and labs.  Questions were answered to the patient's satisfaction.     [name]

## 2016-11-11 ENCOUNTER — Encounter (HOSPITAL_BASED_OUTPATIENT_CLINIC_OR_DEPARTMENT_OTHER): Payer: Self-pay | Admitting: Urology

## 2016-11-11 ENCOUNTER — Ambulatory Visit (INDEPENDENT_AMBULATORY_CARE_PROVIDER_SITE_OTHER): Payer: Medicare Other | Admitting: Interventional Cardiology

## 2016-11-11 ENCOUNTER — Other Ambulatory Visit: Payer: Medicare Other | Admitting: *Deleted

## 2016-11-11 VITALS — BP 134/64 | HR 73 | Ht 73.0 in | Wt 182.0 lb

## 2016-11-11 DIAGNOSIS — I5032 Chronic diastolic (congestive) heart failure: Secondary | ICD-10-CM

## 2016-11-11 DIAGNOSIS — I252 Old myocardial infarction: Secondary | ICD-10-CM | POA: Diagnosis not present

## 2016-11-11 DIAGNOSIS — I441 Atrioventricular block, second degree: Secondary | ICD-10-CM | POA: Diagnosis not present

## 2016-11-11 DIAGNOSIS — I440 Atrioventricular block, first degree: Secondary | ICD-10-CM | POA: Diagnosis not present

## 2016-11-11 DIAGNOSIS — I10 Essential (primary) hypertension: Secondary | ICD-10-CM | POA: Diagnosis not present

## 2016-11-11 DIAGNOSIS — I5023 Acute on chronic systolic (congestive) heart failure: Secondary | ICD-10-CM | POA: Diagnosis not present

## 2016-11-11 DIAGNOSIS — I700 Atherosclerosis of aorta: Secondary | ICD-10-CM

## 2016-11-11 LAB — BASIC METABOLIC PANEL
BUN/Creatinine Ratio: 19 (ref 10–24)
BUN: 17 mg/dL (ref 8–27)
CALCIUM: 9.2 mg/dL (ref 8.6–10.2)
CO2: 23 mmol/L (ref 18–29)
CREATININE: 0.89 mg/dL (ref 0.76–1.27)
Chloride: 101 mmol/L (ref 96–106)
GFR calc Af Amer: 92 mL/min/{1.73_m2} (ref >59)
GFR, EST NON AFRICAN AMERICAN: 80 mL/min/{1.73_m2} (ref >59)
Glucose: 83 mg/dL (ref 65–99)
POTASSIUM: 4.1 mmol/L (ref 3.5–5.2)
Sodium: 141 mmol/L (ref 134–144)

## 2016-11-11 LAB — PRO B NATRIURETIC PEPTIDE: NT-Pro BNP: 380 pg/mL (ref 0–486)

## 2016-11-11 NOTE — Patient Instructions (Signed)
Medication Instructions:  None  Labwork: BMET and BNP today  Testing/Procedures: None  Follow-Up: Your physician wants you to follow-up in: 6 months with Dr. Tamala Julian or a PA or NP on his team. You will receive a reminder letter in the mail two months in advance. If you don't receive a letter, please call our office to schedule the follow-up appointment.   Any Other Special Instructions Will Be Listed Below (If Applicable).     If you need a refill on your cardiac medications before your next appointment, please call your pharmacy.

## 2016-11-14 ENCOUNTER — Telehealth: Payer: Self-pay | Admitting: *Deleted

## 2016-11-14 NOTE — Telephone Encounter (Signed)
Lmtcb to go over lab results 

## 2016-11-15 ENCOUNTER — Telehealth: Payer: Self-pay | Admitting: Interventional Cardiology

## 2016-11-15 ENCOUNTER — Ambulatory Visit: Payer: Medicare Other | Admitting: Physical Therapy

## 2016-11-15 DIAGNOSIS — M6281 Muscle weakness (generalized): Secondary | ICD-10-CM

## 2016-11-15 DIAGNOSIS — R2681 Unsteadiness on feet: Secondary | ICD-10-CM | POA: Diagnosis not present

## 2016-11-15 NOTE — Telephone Encounter (Signed)
°  New message    pt verbalized that he is returning rn call for labs

## 2016-11-15 NOTE — Therapy (Signed)
[facility] [place], [place], [address] Phone: [phone]   Fax:  [phone]  Physical Therapy Treatment  Patient Details  Name: Joshua [name] MRN: [id number] Date of Birth: [birth year] Referring Provider: Redge Gainer MD  Encounter Date: 11/15/2016      PT End of Session - 11/15/16 1318    Visit Number 11   Number of Visits 16   Date for PT Re-Evaluation 11/19/16   PT Start Time 0102      Past Medical History:  Diagnosis Date  . 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 Arthritis   . Bilateral lower extremity edema   . Bladder tumor   . CAD (coronary artery disease) cardiologist--  dr Daneen Schick   a. 04/27/2016 PCI with DES to RCA with 50% ostial to 60% segmental mid to distal left main, and 90% thrombus filled ostial to proximal circumflex, with distal right coronary filled by collaterals from left-to-right  . COPD with emphysema ([place])   . Dyspnea on exertion   . ED (erectile dysfunction)   . First degree AV block   . GERD (gastroesophageal reflux disease)   . History of adenomatous polyp of colon    09-22-2008  . History of bladder cancer    s/p  resection bladder tumor 04-19-2016  non-invasive low-grade urothelial carcinoma  . History of prostate cancer urologist-  dr Jeffie Pollock-- last PSA 0.02 (summer 2017)   dx 02/ 2013-- Stage T2a, Gleason 7, PSA 1.58--  s/p  radioactive prostate seed implants 01-19-2012  . History of ST elevation myocardial infarction (STEMI)    04-27-2016  . Hyperlipidemia   . Hypertension   . S/P drug eluting coronary stent placement    04-27-2016   . Second degree Mobitz I AV block    occasional w/ first degree heart block  per cardiologist note by dr Daneen Schick  . Seizures ([place]) per pt son -- no seizure's since 1980   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  arteriorvenous cerebral malformation s/p  craniotomy 1980  .  Vitamin D deficiency     Past Surgical History:  Procedure Laterality Date  . APPENDECTOMY  2011  . BLEPHAROPLASTY Bilateral revision 02-17-2016  . CARDIAC CATHETERIZATION N/A 04/27/2016   Procedure: Left Heart Cath and Coronary Angiography;  Surgeon: Belva Crome, MD;  Location: [facility];  Service: Cardiovascular;  Laterality: N/A;  acute inferior wall STEMI ;  ostCx to midCx 90%,  ost1st Mrg to 1st Mrg 50%,  mid to distal RCA 100%,  pRCA 80%,  LVEF 45-50% w/ inferobasal akinesis  . CARDIAC CATHETERIZATION N/A 04/27/2016   Procedure: Temporary Pacemaker;  Surgeon: Belva Crome, MD;  Location: [facility];  Service: Cardiovascular;  Laterality: N/A;  mobitz 2  second degree HB w/ heart rate 29bpm  . CARDIAC CATHETERIZATION N/A 04/27/2016   Procedure: Coronary Stent Intervention;  Surgeon: Belva Crome, MD;  Location: [facility];  Service: Cardiovascular;  Laterality: N/A; DES x1 to  Proximal RCA;  DES x1 to Mid RCA  . CATARACT EXTRACTION W/ INTRAOCULAR LENS  IMPLANT, BILATERAL  2016  approx.  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 CYSTOSCOPY  01/19/2012   Procedure: CYSTOSCOPY;  Surgeon: Malka So, MD;  Location: [facility];  Service: Urology;;  no seeds found in bladder  . CYSTOSCOPY W/  RETROGRADES Bilateral 04/19/2016   Procedure: CYSTOSCOPY WITH BILATERAL RETROGRADE PYELOGRAM TRANSURETHRAL RESECTION OF BLADDER TUMOR ;  Surgeon: [name], MD;  Location: [facility];  Service: Urology;  Laterality: Bilateral;  . CYSTOSCOPY WITH BIOPSY N/A 11/10/2016   Procedure: CYSTOSCOPY WITH BIOPSY AND FULGURATION;  Surgeon: [name], MD;  Location: [facility];  Service: Urology;  Laterality: N/A;  . RADIOACTIVE SEED IMPLANT  01/19/2012   Procedure: RADIOACTIVE SEED IMPLANT;  Surgeon: Malka So, MD;  Location: [facility];  Service: Urology;  Laterality: N/A;  68 seeds implanted  .  TRANSTHORACIC ECHOCARDIOGRAM  06/02/2016   ef 60-65%/  trivial MR/  mild TR    There were no vitals filed for this visit.      Subjective Assessment - 11/15/16 1320    Subjective No new complaints.    Treatment:  Nustep level 7 x 20 minutes knee extension with 10# x 5 minutes f/b 40# x 5 minutes f/b Leg Press with 3 plates x 3 minutes; 4 plates x 3 minutes. 5 plates x 1 minutes and 6 plate x 1 minutes.  Outstanding job today.                              PT Short Term Goals - 10/31/16 1314      PT SHORT TERM GOAL #1   Title Berg score= 46/56.   Time 4   Period Weeks   Status Achieved  47/56 10/31/16           PT Long Term Goals - 10/31/16 1314      PT LONG TERM GOAL #1   Title Berg score= 49-50/56.   Time 8   Period Weeks   Status On-going     PT LONG TERM GOAL #2   Title Independent with a HEP.   Time 8   Period Weeks   Status On-going             Patient will benefit from skilled therapeutic intervention in order to improve the following deficits and impairments:     Visit Diagnosis: Unsteadiness on feet  Muscle weakness (generalized)     Problem List Patient Active Problem List   Diagnosis Date Noted  . Aortic atherosclerosis ([place]) 06/03/2016  . General weakness   . Protein-calorie malnutrition, severe ([place])   . Bradycardia 06/02/2016  . GI bleed 06/01/2016  . 1st degree AV block   . AV block, Mobitz 2 04/27/2016  . Old MI (myocardial infarction) 04/27/2016  . Bladder cancer ([place]) 04/18/2016  . Vitamin D deficiency 10/15/2015  . Prostate cancer ([place])   . Hypercholesterolemia   . Essential hypertension   . Seizures ([place])   . GERD 08/22/2008  . COLONIC POLYPS, ADENOMATOUS, HX OF 08/22/2008    Burel Kahre, [place] MPT 11/15/2016, 1:44 PM  [facility] [place], [place], [address] Phone: [phone]   Fax:  [phone]  Name: Joshua [name] MRN:  [id number] Date of Birth: [birth year]

## 2016-11-15 NOTE — Telephone Encounter (Signed)
Informed pt of lab results. Pt verbalized understanding. 

## 2016-11-17 ENCOUNTER — Telehealth: Payer: Self-pay | Admitting: Family Medicine

## 2016-11-17 ENCOUNTER — Ambulatory Visit: Payer: Medicare Other | Attending: Family Medicine | Admitting: Physical Therapy

## 2016-11-17 ENCOUNTER — Encounter: Payer: Self-pay | Admitting: Physical Therapy

## 2016-11-17 DIAGNOSIS — R2681 Unsteadiness on feet: Secondary | ICD-10-CM | POA: Diagnosis not present

## 2016-11-17 DIAGNOSIS — M6281 Muscle weakness (generalized): Secondary | ICD-10-CM | POA: Insufficient documentation

## 2016-11-17 NOTE — Therapy (Signed)
[facility] [place], [place], [address] Phone: [phone]   Fax:  [phone]  Physical Therapy Treatment  Patient Details  Name: Joshua [name] MRN: [id number] Date of Birth: Aug 17, 1932 Referring Provider: Redge Gainer MD  Encounter Date: 11/17/2016      PT End of Session - 11/17/16 1336    Visit Number 12   Number of Visits 16   Date for PT Re-Evaluation 11/19/16   PT Start Time 2197   PT Stop Time 1358   PT Time Calculation (min) 41 min   Activity Tolerance Patient tolerated treatment well   Behavior During Therapy [facility] for tasks assessed/performed      Past Medical History:  Diagnosis Date  . 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 Arthritis   . Bilateral lower extremity edema   . Bladder tumor   . CAD (coronary artery disease) cardiologist--  dr Daneen Schick   a. 04/27/2016 PCI with DES to RCA with 50% ostial to 60% segmental mid to distal left main, and 90% thrombus filled ostial to proximal circumflex, with distal right coronary filled by collaterals from left-to-right  . COPD with emphysema ([place])   . Dyspnea on exertion   . ED (erectile dysfunction)   . First degree AV block   . GERD (gastroesophageal reflux disease)   . History of adenomatous polyp of colon    09-22-2008  . History of bladder cancer    s/p  resection bladder tumor 04-19-2016  non-invasive low-grade urothelial carcinoma  . History of prostate cancer urologist-  dr Jeffie Pollock-- last PSA 0.02 (summer 2017)   dx 02/ 2013-- Stage T2a, Gleason 7, PSA 1.58--  s/p  radioactive prostate seed implants 01-19-2012  . History of ST elevation myocardial infarction (STEMI)    04-27-2016  . Hyperlipidemia   . Hypertension   . S/P drug eluting coronary stent placement    04-27-2016   . Second degree Mobitz I AV block    occasional w/ first degree heart block  per cardiologist note by dr Daneen Schick  .  Seizures ([place]) per pt son -- no seizure's since 1980   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  arteriorvenous cerebral malformation s/p  craniotomy 1980  . Vitamin D deficiency     Past Surgical History:  Procedure Laterality Date  . APPENDECTOMY  2011  . BLEPHAROPLASTY Bilateral revision 02-17-2016  . CARDIAC CATHETERIZATION N/A 04/27/2016   Procedure: Left Heart Cath and Coronary Angiography;  Surgeon: Belva Crome, MD;  Location: [place] [facility];  Service: Cardiovascular;  Laterality: N/A;  acute inferior wall STEMI ;  ostCx to midCx 90%,  ost1st Mrg to 1st Mrg 50%,  mid to distal RCA 100%,  pRCA 80%,  LVEF 45-50% w/ inferobasal akinesis  . CARDIAC CATHETERIZATION N/A 04/27/2016   Procedure: Temporary Pacemaker;  Surgeon: Belva Crome, MD;  Location: [place] [facility];  Service: Cardiovascular;  Laterality: N/A;  mobitz 2  second degree HB w/ heart rate 29bpm  . CARDIAC CATHETERIZATION N/A 04/27/2016   Procedure: Coronary Stent Intervention;  Surgeon: Belva Crome, MD;  Location: [place] [facility];  Service: Cardiovascular;  Laterality: N/A; DES x1 to  Proximal RCA;  DES x1 to Mid RCA  . CATARACT EXTRACTION W/ INTRAOCULAR LENS  IMPLANT, BILATERAL  2016  approx.  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 CYSTOSCOPY  01/19/2012   Procedure: CYSTOSCOPY;  Surgeon: Malka So, MD;  [facility];  Service: Urology;;  no seeds found in bladder  . CYSTOSCOPY W/ RETROGRADES Bilateral 04/19/2016   Procedure: CYSTOSCOPY WITH BILATERAL RETROGRADE PYELOGRAM TRANSURETHRAL RESECTION OF BLADDER TUMOR ;  Surgeon: [name], MD;  Location: [facility];  Service: Urology;  Laterality: Bilateral;  . CYSTOSCOPY WITH BIOPSY N/A 11/10/2016   Procedure: CYSTOSCOPY WITH BIOPSY AND FULGURATION;  Surgeon: [name], MD;  Location: [facility];  Service: Urology;  Laterality: N/A;  . RADIOACTIVE SEED IMPLANT  01/19/2012    Procedure: RADIOACTIVE SEED IMPLANT;  Surgeon: Malka So, MD;  Location: [facility];  Service: Urology;  Laterality: N/A;  68 seeds implanted  . TRANSTHORACIC ECHOCARDIOGRAM  06/02/2016   ef 60-65%/  trivial MR/  mild TR    There were no vitals filed for this visit.      Subjective Assessment - 11/17/16 1322    Subjective Patient reported doing well overall and no complaints with therapy. Patient had a fall at home last week after bladder proceure and was in bathroom at home and fell may have been from medicaion i his system   Pertinent History Focal seizures many years ago affecting his right LE.   Patient Stated Goals Walk better and not lose balance.   Currently in Pain? No/denies            [facility] PT Assessment - 11/17/16 0001      Berg Balance Test   Sit to Stand Able to stand  independently using hands   Standing Unsupported Able to stand safely 2 minutes   Sitting with Back Unsupported but Feet Supported on Floor or Stool Able to sit safely and securely 2 minutes   Stand to Sit Sits safely with minimal use of hands   Transfers Able to transfer safely, minor use of hands   Standing Unsupported with Eyes Closed Able to stand 10 seconds safely   Standing Ubsupported with Feet Together Able to place feet together independently and stand 1 minute safely   From Standing, Reach Forward with Outstretched Arm Can reach confidently >25 cm (10")   From Standing Position, Pick up Object from Floor Able to pick up shoe safely and easily   From Standing Position, Turn to Look Behind Over each Shoulder Looks behind from both sides and weight shifts well   Turn 360 Degrees Able to turn 360 degrees safely one side only in 4 seconds or less   Standing Unsupported, Alternately Place Feet on Step/Stool Able to stand independently and complete 8 steps >20 seconds   Standing Unsupported, One Foot in Front Able to take small step independently and hold 30 seconds   Standing on  One Leg Able to lift leg independently and hold equal to or more than 3 seconds   Total Score 49                     [facility] Adult PT Treatment/Exercise - 11/17/16 0001      Knee/Hip Exercises: Aerobic   Nustep L7 x8mn UE/LE monitored for progression     Knee/Hip Exercises: Machines for Strengthening   Cybex Knee Extension 10# 3x10   Cybex Knee Flexion 30# x30   Total Gym Leg Press 3plts 3x15             Balance Exercises - 11/17/16 1343      Balance Exercises: Standing   Standing Eyes  Opened Narrow base of support (BOS);Wide (BOA);Solid surface;Time   Sit to Stand Time x10   Overall Comments Other (comment)  reviewed all other BERG tests             PT Short Term Goals - 10/31/16 1314      PT SHORT TERM GOAL #1   Title Berg score= 46/56.   Time 4   Period Weeks   Status Achieved  47/56 10/31/16           PT Long Term Goals - 11/17/16 1350      PT LONG TERM GOAL #1   Title Berg score= 49-50/56.   Time 8   Period Weeks   Status Achieved  49/56 11/17/16     PT LONG TERM GOAL #2   Title Independent with a HEP.   Time 8   Period Weeks   Status Achieved  11/17/16               Plan - 11/17/16 1350    Clinical Impression Statement Patient has met all current goals. Patient would like to DC to [facility] program. Patient independent with HEP. Educated patient on use of assistive device and safety for fall prevention.   Rehab Potential Good   PT Frequency 2x / week   PT Duration 8 weeks   PT Treatment/Interventions ADLs/Self Care Home Management;Gait training;Functional mobility training;Therapeutic activities;Therapeutic exercise;Balance training;Neuromuscular re-education;Patient/family education   PT Next Visit Plan DC   Consulted and Agree with Plan of Care Patient      Patient will benefit from skilled therapeutic intervention in order to improve the following deficits and impairments:  Abnormal gait, Decreased activity tolerance,  Decreased balance, Decreased coordination, Difficulty walking  Visit Diagnosis: Unsteadiness on feet  Muscle weakness (generalized)     Problem List Patient Active Problem List   Diagnosis Date Noted  . Aortic atherosclerosis ([place]) 06/03/2016  . General weakness   . Protein-calorie malnutrition, severe ([place])   . Bradycardia 06/02/2016  . GI bleed 06/01/2016  . 1st degree AV block   . AV block, Mobitz 2 04/27/2016  . Old MI (myocardial infarction) 04/27/2016  . Bladder cancer ([place]) 04/18/2016  . Vitamin D deficiency 10/15/2015  . Prostate cancer ([place])   . Hypercholesterolemia   . Essential hypertension   . Seizures ([place])   . GERD 08/22/2008  . COLONIC POLYPS, ADENOMATOUS, HX OF 08/22/2008   Ladean Raya, PTA 11/17/16 2:04 PM  [facility] [place], [place], [address] Phone: [phone]   Fax:  [phone]  Name: Joshua [name] MRN: [id number] Date of Birth: [birth year]  PHYSICAL THERAPY DISCHARGE SUMMARY  Visits from Start of Care: 12.  Current functional level related to goals / functional outcomes: See above.   Remaining deficits: All goals met.   Education / Equipment: HEP. Plan: Patient agrees to discharge.  Patient goals were met. Patient is being discharged due to meeting the stated rehab goals.  ?????         Mali Applegate MPT

## 2016-11-21 MED ORDER — CITALOPRAM HYDROBROMIDE 20 MG PO TABS: 20.0000 mg | ORAL_TABLET | Freq: Every day | ORAL | 1 refills | Status: DC

## 2016-11-21 NOTE — Telephone Encounter (Signed)
Done

## 2016-11-23 DIAGNOSIS — Z8546 Personal history of malignant neoplasm of prostate: Secondary | ICD-10-CM | POA: Diagnosis not present

## 2016-11-23 DIAGNOSIS — C672 Malignant neoplasm of lateral wall of bladder: Secondary | ICD-10-CM | POA: Diagnosis not present

## 2016-11-29 DIAGNOSIS — C672 Malignant neoplasm of lateral wall of bladder: Secondary | ICD-10-CM | POA: Diagnosis not present

## 2016-11-29 DIAGNOSIS — Z5111 Encounter for antineoplastic chemotherapy: Secondary | ICD-10-CM | POA: Diagnosis not present

## 2016-12-06 DIAGNOSIS — Z5111 Encounter for antineoplastic chemotherapy: Secondary | ICD-10-CM | POA: Diagnosis not present

## 2016-12-06 DIAGNOSIS — C672 Malignant neoplasm of lateral wall of bladder: Secondary | ICD-10-CM | POA: Diagnosis not present

## 2016-12-09 ENCOUNTER — Encounter: Payer: Self-pay | Admitting: Family Medicine

## 2016-12-09 ENCOUNTER — Ambulatory Visit (INDEPENDENT_AMBULATORY_CARE_PROVIDER_SITE_OTHER): Payer: Medicare Other | Admitting: Family Medicine

## 2016-12-09 VITALS — BP 126/68 | HR 77 | Temp 96.6°F | Ht 73.0 in | Wt 177.0 lb

## 2016-12-09 DIAGNOSIS — M25361 Other instability, right knee: Secondary | ICD-10-CM

## 2016-12-09 DIAGNOSIS — E7800 Pure hypercholesterolemia, unspecified: Secondary | ICD-10-CM | POA: Diagnosis not present

## 2016-12-09 DIAGNOSIS — E559 Vitamin D deficiency, unspecified: Secondary | ICD-10-CM

## 2016-12-09 DIAGNOSIS — R2681 Unsteadiness on feet: Secondary | ICD-10-CM | POA: Diagnosis not present

## 2016-12-09 DIAGNOSIS — K219 Gastro-esophageal reflux disease without esophagitis: Secondary | ICD-10-CM | POA: Diagnosis not present

## 2016-12-09 DIAGNOSIS — R296 Repeated falls: Secondary | ICD-10-CM | POA: Diagnosis not present

## 2016-12-09 DIAGNOSIS — D692 Other nonthrombocytopenic purpura: Secondary | ICD-10-CM

## 2016-12-09 DIAGNOSIS — I2510 Atherosclerotic heart disease of native coronary artery without angina pectoris: Secondary | ICD-10-CM

## 2016-12-09 DIAGNOSIS — C61 Malignant neoplasm of prostate: Secondary | ICD-10-CM

## 2016-12-09 DIAGNOSIS — I10 Essential (primary) hypertension: Secondary | ICD-10-CM

## 2016-12-09 DIAGNOSIS — M25561 Pain in right knee: Secondary | ICD-10-CM

## 2016-12-09 DIAGNOSIS — G40909 Epilepsy, unspecified, not intractable, without status epilepticus: Secondary | ICD-10-CM | POA: Diagnosis not present

## 2016-12-09 DIAGNOSIS — C672 Malignant neoplasm of lateral wall of bladder: Secondary | ICD-10-CM

## 2016-12-09 DIAGNOSIS — R251 Tremor, unspecified: Secondary | ICD-10-CM

## 2016-12-09 NOTE — Patient Instructions (Addendum)
Medicare Annual Wellness Visit  [facility] and the medical providers at [facility] strive to bring you the best medical care.  In doing so we not only want to address your current medical conditions and concerns but also to detect new conditions early and prevent illness, disease and health-related problems.    Medicare offers a yearly Wellness Visit which allows our clinical staff to assess your need for preventative services including immunizations, lifestyle education, counseling to decrease risk of preventable diseases and screening for fall risk and other medical concerns.    This visit is provided free of charge (no copay) for all Medicare recipients. The clinical pharmacists at [facility] have begun to conduct these Wellness Visits which will also include a thorough review of all your medications.    As you primary medical provider recommend that you make an appointment for your Annual Wellness Visit if you have not done so already this year.  You may set up this appointment before you leave today or you may call back ([phone]) and schedule an appointment.  Please make sure when you call that you mention that you are scheduling your Annual Wellness Visit with the clinical pharmacist so that the appointment may be made for the proper length of time.     Continue current medications. Continue good therapeutic lifestyle changes which include good diet and exercise. Fall precautions discussed with patient. If an FOBT was given today- please return it to our front desk. If you are over 1 years old - you may need Prevnar 36 or the adult Pneumonia vaccine.  **Flu shots are available--- please call and schedule a FLU-CLINIC appointment**  After your visit with [place] today you will receive a survey in the mail or online from [employer] regarding your care with [place]. Please take a moment to fill this out. Your feedback is very  important to [place] as you can help [place] better understand your patient needs as well as improve your experience and satisfaction. WE CARE ABOUT YOU!!!   We will arrange a appointment with dr Alyson Reedy  For the right knee

## 2016-12-09 NOTE — Progress Notes (Signed)
Subjective:    Patient ID: Joshua [name], male    DOB: 12-25-1931, 83 y.o.   MRN: [id number]  HPI Pt here for follow up and management of chronic medical problems which includes hypertension and hyperlipidemia. He is taking medication regularly.The patient has had 2 falls recently he feels like his right knee is buckling. His blood pressure today was 82/55. The patient is being followed regularly by cardiology and urology because of prostate cancer and bladder cancer. He comes to the visit today using his rolling walker. Patient feels like his right knee wants to buckle. He is following twice recently. We've discussed with him and we will arrange for him to see an orthopedic surgeon. He is also having some concern about taking his spironolactone for fluid retention and getting treated for his bladder cancer. He supposedly has to hold the urine for at least 2 hours after getting the treatment. We've told him to try leaving the fluid pill off the day he gets the bladder cancer treatment or if they give him a sublingual medicine that will help him retain the urine for a while he can do that instead of holding the fluid pill so he is going to do whichever one works the best for his bladder treatment. He denies any chest pain or shortness of breath. He has no trouble with his intestinal tract including blood in the stool or black tarry bowel movements. He is passing his water okay and sometimes too often. He also will try using some support hose at home to see if this helps with fluid retention in his legs.   Patient Active Problem List   Diagnosis Date Noted  . Aortic atherosclerosis ([place]) 06/03/2016  . General weakness   . Protein-calorie malnutrition, severe ([place])   . Bradycardia 06/02/2016  . GI bleed 06/01/2016  . 1st degree AV block   . AV block, Mobitz 2 04/27/2016  . Old MI (myocardial infarction) 04/27/2016  . Bladder cancer ([place]) 04/18/2016  . Vitamin D deficiency 10/15/2015  . Prostate  cancer ([place])   . Hypercholesterolemia   . Essential hypertension   . Seizures ([place])   . GERD 08/22/2008  . COLONIC POLYPS, ADENOMATOUS, HX OF 08/22/2008   Outpatient Encounter Prescriptions as of 12/09/2016  Medication Sig  . aspirin EC 81 MG tablet Take 1 tablet (81 mg total) by mouth daily.  Marland Kitchen atorvastatin (LIPITOR) 20 MG tablet Take 1 tablet (20 mg total) by mouth daily.  . Cholecalciferol (VITAMIN D3) 1000 units CAPS Take 1 capsule by mouth daily.  . citalopram (CELEXA) 20 MG tablet Take 1 tablet (20 mg total) by mouth daily.  . clopidogrel (PLAVIX) 75 MG tablet Take 1 tablet (75 mg total) by mouth daily.  . Multiple Vitamins-Minerals (PRESERVISION AREDS 2) CAPS Take 1 capsule by mouth daily.  Marland Kitchen omeprazole (PRILOSEC) 20 MG capsule Take 20 mg by mouth daily.  Marland Kitchen PHENobarbital (LUMINAL) 64.8 MG tablet Take 1 tablet (64.8 mg total) by mouth 2 (two) times daily.  . phenytoin (DILANTIN) 100 MG ER capsule Take 200 mg by mouth 2 (two) times daily.   Marland Kitchen spironolactone (ALDACTONE) 25 MG tablet Take 0.5 tablets (12.5 mg total) by mouth daily.   No facility-administered encounter medications on file as of 12/09/2016.        Review of Systems  Constitutional: Negative.   HENT: Negative.   Eyes: Negative.   Respiratory: Negative.   Cardiovascular: Negative.   Gastrointestinal: Negative.   Endocrine: Negative.   Genitourinary:  Negative.   Musculoskeletal: Positive for arthralgias (right knee "buckles" - several falls. ).  Skin: Negative.   Allergic/Immunologic: Negative.   Neurological: Negative.   Hematological: Negative.   Psychiatric/Behavioral: Negative.        Objective:   Physical Exam  Constitutional: He is oriented to person, place, and time. He appears well-developed and well-nourished. No distress.  The patient is alert and cooperative. He is using his walker and deaf and has some weakness in the right knee with walking.  HENT:  Head: Normocephalic and atraumatic.  Right  Ear: External ear normal.  Left Ear: External ear normal.  Nose: Nose normal.  Mouth/Throat: Oropharynx is clear and moist. No oropharyngeal exudate.  Eyes: Conjunctivae and EOM are normal. Pupils are equal, round, and reactive to light. Right eye exhibits no discharge. Left eye exhibits no discharge. No scleral icterus.  Neck: Normal range of motion. Neck supple. No thyromegaly present.  Cardiovascular: Normal rate, regular rhythm and normal heart sounds.   No murmur heard. Heart is 72/m with a regular rate and rhythm  Pulmonary/Chest: Effort normal and breath sounds normal. No respiratory distress. He has no wheezes. He has no rales. He exhibits no tenderness.  Clear anteriorly and posteriorly  Abdominal: Soft. Bowel sounds are normal. He exhibits no mass. There is no tenderness. There is no rebound and no guarding.  No abdominal tenderness masses or bruits  Genitourinary:  Genitourinary Comments: The patient sees the urologist regularly because of his prostate cancer and bladder cancer.  Musculoskeletal: He exhibits no edema or tenderness.  There is some slight pretibial edema bilaterally but not a lot. There is gait instability. There is no swelling in the right knee no redness and no tenderness with palpation.  Lymphadenopathy:    He has no cervical adenopathy.  Neurological: He is alert and oriented to person, place, and time. He has normal reflexes. No cranial nerve deficit.  Skin: Skin is warm and dry. No rash noted.  Psychiatric: He has a normal mood and affect. His behavior is normal. Judgment and thought content normal.  Nursing note and vitals reviewed.  BP (!) 82/55 (BP Location: Left Arm)   Pulse 77   Temp (!) 96.6 F (35.9 C) (Oral)   Ht 6' 1"  (1.854 m)   Wt 177 lb (80.3 kg)   BMI 23.35 kg/m         Assessment & Plan:  1. Hypercholesterolemia -Continue with aggressive therapeutic lifestyle changes and current cholesterol medication - CBC with  Differential/Platelet - Lipid panel  2. Essential hypertension -The blood pressure on repeat by me today using a regular cuff was 122/68. - BMP8+EGFR - CBC with Differential/Platelet - Hepatic function panel  3. Vitamin D deficiency -Continue vitamin D replacement pending results of lab work - CBC with Differential/Platelet - VITAMIN D 25 Hydroxy (Vit-D Deficiency, Fractures)  4. ASCVD (arteriosclerotic cardiovascular disease) -Continue follow-up with cardiology - CBC with Differential/Platelet - Lipid panel  5. Gastroesophageal reflux disease, esophagitis presence not specified -Continue current treatment and dietary management - CBC with Differential/Platelet - Hepatic function panel  6. Seizure disorder (HCC) -Continue Dilantin. - CBC with Differential/Platelet  7. Gait instability -Continue to use walker and get appointment with orthopedic as soon as possible - CBC with Differential/Platelet - Ambulatory referral to Orthopedic Surgery  8. Acute pain of right knee -The patient is not having any knee pain is just knee weakness. - Ambulatory referral to Orthopedic Surgery  9. Right knee buckling - Ambulatory referral to  Orthopedic Surgery  10. Frequent falls - Ambulatory referral to Orthopedic Surgery  11. Prostate cancer and bladder cancer -Continue to follow-up with urology for treatment.  Patient Instructions                       Medicare Annual Wellness Visit  [facility] and the medical providers at [facility] strive to bring you the best medical care.  In doing so we not only want to address your current medical conditions and concerns but also to detect new conditions early and prevent illness, disease and health-related problems.    Medicare offers a yearly Wellness Visit which allows our clinical staff to assess your need for preventative services including immunizations, lifestyle education, counseling to decrease risk of  preventable diseases and screening for fall risk and other medical concerns.    This visit is provided free of charge (no copay) for all Medicare recipients. The clinical pharmacists at [facility] have begun to conduct these Wellness Visits which will also include a thorough review of all your medications.    As you primary medical provider recommend that you make an appointment for your Annual Wellness Visit if you have not done so already this year.  You may set up this appointment before you leave today or you may call back ([phone]) and schedule an appointment.  Please make sure when you call that you mention that you are scheduling your Annual Wellness Visit with the clinical pharmacist so that the appointment may be made for the proper length of time.     Continue current medications. Continue good therapeutic lifestyle changes which include good diet and exercise. Fall precautions discussed with patient. If an FOBT was given today- please return it to our front desk. If you are over 51 years old - you may need Prevnar 3 or the adult Pneumonia vaccine.  **Flu shots are available--- please call and schedule a FLU-CLINIC appointment**  After your visit with [place] today you will receive a survey in the mail or online from [employer] regarding your care with [place]. Please take a moment to fill this out. Your feedback is very important to [place] as you can help [place] better understand your patient needs as well as improve your experience and satisfaction. WE CARE ABOUT YOU!!!   We will arrange a appointment with dr Alyson Reedy  For the right knee    Arrie Senate MD

## 2016-12-10 LAB — BMP8+EGFR
BUN/Creatinine Ratio: 26 — ABNORMAL HIGH (ref 10–24)
BUN: 21 mg/dL (ref 8–27)
CALCIUM: 8.9 mg/dL (ref 8.6–10.2)
CHLORIDE: 98 mmol/L (ref 96–106)
CO2: 19 mmol/L (ref 18–29)
Creatinine, Ser: 0.82 mg/dL (ref 0.76–1.27)
GFR calc Af Amer: 95 mL/min/{1.73_m2} (ref >59)
GFR calc non Af Amer: 82 mL/min/{1.73_m2} (ref >59)
GLUCOSE: 69 mg/dL (ref 65–99)
POTASSIUM: 4.1 mmol/L (ref 3.5–5.2)
Sodium: 136 mmol/L (ref 134–144)

## 2016-12-10 LAB — LIPID PANEL
Chol/HDL Ratio: 2.1 ratio units (ref 0.0–5.0)
Cholesterol, Total: 151 mg/dL (ref 100–199)
HDL: 73 mg/dL (ref >39)
LDL Calculated: 65 mg/dL (ref 0–99)
Triglycerides: 66 mg/dL (ref 0–149)
VLDL CHOLESTEROL CAL: 13 mg/dL (ref 5–40)

## 2016-12-10 LAB — CBC WITH DIFFERENTIAL/PLATELET
BASOS ABS: 0.1 10*3/uL (ref 0.0–0.2)
Basos: 1 %
EOS (ABSOLUTE): 0.2 10*3/uL (ref 0.0–0.4)
Eos: 2 %
Hematocrit: 34.8 % — ABNORMAL LOW (ref 37.5–51.0)
Hemoglobin: 11.2 g/dL — ABNORMAL LOW (ref 13.0–17.7)
IMMATURE GRANS (ABS): 0 10*3/uL (ref 0.0–0.1)
IMMATURE GRANULOCYTES: 0 %
LYMPHS: 18 %
Lymphocytes Absolute: 2.2 10*3/uL (ref 0.7–3.1)
MCH: 24.3 pg — AB (ref 26.6–33.0)
MCHC: 32.2 g/dL (ref 31.5–35.7)
MCV: 76 fL — ABNORMAL LOW (ref 79–97)
Monocytes Absolute: 2 10*3/uL — ABNORMAL HIGH (ref 0.1–0.9)
Monocytes: 16 %
NEUTROS ABS: 7.8 10*3/uL — AB (ref 1.4–7.0)
NEUTROS PCT: 63 %
PLATELETS: 264 10*3/uL (ref 150–379)
RBC: 4.61 x10E6/uL (ref 4.14–5.80)
RDW: 21 % — ABNORMAL HIGH (ref 12.3–15.4)
WBC: 12.2 10*3/uL — AB (ref 3.4–10.8)

## 2016-12-10 LAB — HEPATIC FUNCTION PANEL
ALT: 20 IU/L (ref 0–44)
AST: 34 IU/L (ref 0–40)
Albumin: 3.9 g/dL (ref 3.5–4.7)
Alkaline Phosphatase: 150 IU/L — ABNORMAL HIGH (ref 39–117)
BILIRUBIN, DIRECT: 0.16 mg/dL (ref 0.00–0.40)
Bilirubin Total: 0.5 mg/dL (ref 0.0–1.2)
TOTAL PROTEIN: 6.8 g/dL (ref 6.0–8.5)

## 2016-12-10 LAB — VITAMIN D 25 HYDROXY (VIT D DEFICIENCY, FRACTURES): Vit D, 25-Hydroxy: 26.4 ng/mL — ABNORMAL LOW (ref 30.0–100.0)

## 2016-12-13 DIAGNOSIS — Z5111 Encounter for antineoplastic chemotherapy: Secondary | ICD-10-CM | POA: Diagnosis not present

## 2016-12-13 DIAGNOSIS — C672 Malignant neoplasm of lateral wall of bladder: Secondary | ICD-10-CM | POA: Diagnosis not present

## 2016-12-13 NOTE — Addendum Note (Signed)
Addended by: Zannie Cove on: 12/13/2016 12:39 PM   Modules accepted: Orders

## 2016-12-20 DIAGNOSIS — C672 Malignant neoplasm of lateral wall of bladder: Secondary | ICD-10-CM | POA: Diagnosis not present

## 2016-12-20 DIAGNOSIS — Z5111 Encounter for antineoplastic chemotherapy: Secondary | ICD-10-CM | POA: Diagnosis not present

## 2016-12-22 DIAGNOSIS — M1611 Unilateral primary osteoarthritis, right hip: Secondary | ICD-10-CM | POA: Diagnosis not present

## 2016-12-22 DIAGNOSIS — M1711 Unilateral primary osteoarthritis, right knee: Secondary | ICD-10-CM | POA: Diagnosis not present

## 2016-12-26 ENCOUNTER — Other Ambulatory Visit: Payer: Medicare Other

## 2016-12-26 DIAGNOSIS — Z1211 Encounter for screening for malignant neoplasm of colon: Secondary | ICD-10-CM

## 2016-12-27 DIAGNOSIS — C672 Malignant neoplasm of lateral wall of bladder: Secondary | ICD-10-CM | POA: Diagnosis not present

## 2016-12-27 DIAGNOSIS — Z5111 Encounter for antineoplastic chemotherapy: Secondary | ICD-10-CM | POA: Diagnosis not present

## 2016-12-28 ENCOUNTER — Encounter (HOSPITAL_COMMUNITY): Payer: Self-pay | Admitting: *Deleted

## 2016-12-28 ENCOUNTER — Emergency Department (HOSPITAL_COMMUNITY)
Admission: EM | Admit: 2016-12-28 | Discharge: 2016-12-28 | Disposition: A | Discharge status: Home / Self Care | Payer: Medicare Other | Attending: Emergency Medicine | Admitting: Emergency Medicine

## 2016-12-28 DIAGNOSIS — Z87891 Personal history of nicotine dependence: Secondary | ICD-10-CM | POA: Diagnosis not present

## 2016-12-28 DIAGNOSIS — Z79899 Other long term (current) drug therapy: Secondary | ICD-10-CM | POA: Insufficient documentation

## 2016-12-28 DIAGNOSIS — I10 Essential (primary) hypertension: Secondary | ICD-10-CM | POA: Diagnosis not present

## 2016-12-28 DIAGNOSIS — J449 Chronic obstructive pulmonary disease, unspecified: Secondary | ICD-10-CM | POA: Diagnosis not present

## 2016-12-28 DIAGNOSIS — I2510 Atherosclerotic heart disease of native coronary artery without angina pectoris: Secondary | ICD-10-CM | POA: Diagnosis not present

## 2016-12-28 DIAGNOSIS — Z8546 Personal history of malignant neoplasm of prostate: Secondary | ICD-10-CM | POA: Diagnosis not present

## 2016-12-28 DIAGNOSIS — Z7982 Long term (current) use of aspirin: Secondary | ICD-10-CM | POA: Insufficient documentation

## 2016-12-28 DIAGNOSIS — I252 Old myocardial infarction: Secondary | ICD-10-CM | POA: Insufficient documentation

## 2016-12-28 DIAGNOSIS — R339 Retention of urine, unspecified: Secondary | ICD-10-CM | POA: Diagnosis not present

## 2016-12-28 DIAGNOSIS — R319 Hematuria, unspecified: Secondary | ICD-10-CM | POA: Diagnosis not present

## 2016-12-28 LAB — FECAL OCCULT BLOOD, IMMUNOCHEMICAL: FECAL OCCULT BLD: POSITIVE — AB

## 2016-12-28 NOTE — ED Provider Notes (Signed)
[facility] DEPT Provider Note   CSN: [id number] Arrival date & time: 12/28/16  1935     History   Chief Complaint Chief Complaint  Patient presents with  . Urinary Retention    HPI Joshua [name] is a 83 y.o. male.  The history is provided by the patient, a relative and medical records.  Abdominal Pain   This is a new problem. The current episode started 6 to 12 hours ago. The problem occurs constantly. Associated with: decreased urination. The pain is located in the suprapubic region. The quality of the pain is pressure-like and sharp. The pain is moderate. Nothing aggravates the symptoms. Relieved by: movement.    Past Medical History:  Diagnosis Date  . 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 Arthritis   . Bilateral lower extremity edema   . Bladder tumor   . CAD (coronary artery disease) cardiologist--  dr Daneen Schick   a. 04/27/2016 PCI with DES to RCA with 50% ostial to 60% segmental mid to distal left main, and 90% thrombus filled ostial to proximal circumflex, with distal right coronary filled by collaterals from left-to-right  . COPD with emphysema ([place])   . Dyspnea on exertion   . ED (erectile dysfunction)   . First degree AV block   . GERD (gastroesophageal reflux disease)   . History of adenomatous polyp of colon    09-22-2008  . History of bladder cancer    s/p  resection bladder tumor 04-19-2016  non-invasive low-grade urothelial carcinoma  . History of prostate cancer urologist-  dr Jeffie Pollock-- last PSA 0.02 (summer 2017)   dx 02/ 2013-- Stage T2a, Gleason 7, PSA 1.58--  s/p  radioactive prostate seed implants 01-19-2012  . History of ST elevation myocardial infarction (STEMI)    04-27-2016  . Hyperlipidemia   . Hypertension   . S/P drug eluting coronary stent placement    04-27-2016   . Second degree Mobitz I AV block    occasional w/ first degree heart block  per cardiologist note by dr Daneen Schick   . Seizures ([place]) per pt son -- no seizure's since 1980   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  arteriorvenous cerebral malformation s/p  craniotomy 1980  . Vitamin D deficiency     Patient Active Problem List   Diagnosis Date Noted  . Aortic atherosclerosis ([place]) 06/03/2016  . General weakness   . Protein-calorie malnutrition, severe ([place])   . Bradycardia 06/02/2016  . GI bleed 06/01/2016  . 1st degree AV block   . AV block, Mobitz 2 04/27/2016  . Old MI (myocardial infarction) 04/27/2016  . Bladder cancer ([place]) 04/18/2016  . Vitamin D deficiency 10/15/2015  . Prostate cancer ([place])   . Hypercholesterolemia   . Essential hypertension   . Seizures ([place])   . GERD 08/22/2008  . COLONIC POLYPS, ADENOMATOUS, HX OF 08/22/2008    Past Surgical History:  Procedure Laterality Date  . APPENDECTOMY  2011  . BLEPHAROPLASTY Bilateral revision 02-17-2016  . CARDIAC CATHETERIZATION N/A 04/27/2016   Procedure: Left Heart Cath and Coronary Angiography;  Surgeon: Belva Crome, MD;  Location: [facility];  Service: Cardiovascular;  Laterality: N/A;  acute inferior wall STEMI ;  ostCx to midCx 90%,  ost1st Mrg to 1st Mrg 50%,  mid to distal RCA 100%,  pRCA 80%,  LVEF 45-50% w/ inferobasal akinesis  . CARDIAC CATHETERIZATION N/A 04/27/2016   Procedure: Temporary Pacemaker;  Surgeon: Belva Crome, MD;  Location: [facility];  Service: Cardiovascular;  Laterality: N/A;  mobitz 2  second degree HB w/ heart rate 29bpm  . CARDIAC CATHETERIZATION N/A 04/27/2016   Procedure: Coronary Stent Intervention;  Surgeon: Belva Crome, MD;  Location: [facility];  Service: Cardiovascular;  Laterality: N/A; DES x1 to  Proximal RCA;  DES x1 to Mid RCA  . CATARACT EXTRACTION W/ INTRAOCULAR LENS  IMPLANT, BILATERAL  2016  approx.  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 CYSTOSCOPY  01/19/2012   Procedure: CYSTOSCOPY;  Surgeon: Malka So, MD;  Location[facility];  Service: Urology;;  no seeds found in bladder  . CYSTOSCOPY W/ RETROGRADES Bilateral 04/19/2016   Procedure: CYSTOSCOPY WITH BILATERAL RETROGRADE PYELOGRAM TRANSURETHRAL RESECTION OF BLADDER TUMOR ;  Surgeon: [name], MD;  Location[facility];  Service: Urology;  Laterality: Bilateral;  . CYSTOSCOPY WITH BIOPSY N/A 11/10/2016   Procedure: CYSTOSCOPY WITH BIOPSY AND FULGURATION;  Surgeon: [name], MD;  Location[facility];  Service: Urology;  Laterality: N/A;  . RADIOACTIVE SEED IMPLANT  01/19/2012   Procedure: RADIOACTIVE SEED IMPLANT;  Surgeon: Malka So, MD;  Location: [facility];  Service: Urology;  Laterality: N/A;  68 seeds implanted  . TRANSTHORACIC ECHOCARDIOGRAM  06/02/2016   ef 60-65%/  trivial MR/  mild TR       Home Medications    Prior to Admission medications   Medication Sig Start Date End Date Taking? Authorizing Provider  aspirin EC 81 MG tablet Take 1 tablet (81 mg total) by mouth daily. 05/16/16   Isaiah Serge, NP  atorvastatin (LIPITOR) 20 MG tablet Take 1 tablet (20 mg total) by mouth daily. 08/22/16   Chipper Herb, MD  Cholecalciferol (VITAMIN D3) 1000 units CAPS Take 1 capsule by mouth daily.    Historical Provider, MD  citalopram (CELEXA) 20 MG tablet Take 1 tablet (20 mg total) by mouth daily. 11/21/16   Chipper Herb, MD  clopidogrel (PLAVIX) 75 MG tablet Take 1 tablet (75 mg total) by mouth daily. 08/22/16   Chipper Herb, MD  Multiple Vitamins-Minerals (PRESERVISION AREDS 2) CAPS Take 1 capsule by mouth daily.    Historical Provider, MD  omeprazole (PRILOSEC) 20 MG capsule Take 20 mg by mouth daily.    Historical Provider, MD  PHENobarbital (LUMINAL) 64.8 MG tablet Take 1 tablet (64.8 mg total) by mouth 2 (two) times daily. 08/22/16   Chipper Herb, MD  phenytoin (DILANTIN) 100 MG ER capsule Take 200 mg by mouth 2 (two) times daily.     Historical Provider, MD  spironolactone  (ALDACTONE) 25 MG tablet Take 0.5 tablets (12.5 mg total) by mouth daily. 10/10/16 01/08/17  Belva Crome, MD    Family History Family History  Problem Relation Age of Onset  . Emphysema Mother     age 50 deceased  . Stroke Mother   . Emphysema Father     deceased age 51    Social History Social History  Substance Use Topics  . Smoking status: Former Smoker    Packs/day: 2.00    Years: 15.00    Quit date: 01/15/1973  . Smokeless tobacco: Never Used  . Alcohol use Yes     Comment: occasionally     Allergies   Penicillins   Review of Systems Review of Systems  Gastrointestinal: Positive for abdominal  pain.  Genitourinary: Positive for decreased urine volume and urgency.  All other systems reviewed and are negative.    Physical Exam Updated Vital Signs BP (!) 172/101 (BP Location: Left Arm)   Pulse 82   Resp 20   Ht 5\' 6"  (1.676 m)   Wt 174 lb (78.9 kg)   SpO2 95%   BMI 28.08 kg/m   Physical Exam  Constitutional: He is oriented to person, place, and time. He appears well-developed and well-nourished.  HENT:  Head: Normocephalic and atraumatic.  Eyes: Conjunctivae and EOM are normal.  Neck: Normal range of motion.  Cardiovascular: Normal rate.   No murmur heard. Pulmonary/Chest: Effort normal. No respiratory distress.  Abdominal: Soft. He exhibits no distension.  Musculoskeletal: Normal range of motion. He exhibits no edema or deformity.  Neurological: He is alert and oriented to person, place, and time. No cranial nerve deficit. Coordination normal.  Skin: Skin is warm and dry.  Nursing note and vitals reviewed.    ED Treatments / Results  Labs (all labs ordered are listed, but only abnormal results are displayed) Labs Reviewed  URINALYSIS, ROUTINE W REFLEX MICROSCOPIC    EKG  EKG Interpretation None       Radiology No results found.  Procedures Procedures (including critical care time)  Medications Ordered in ED Medications - No data  to display   Initial Impression / Assessment and Plan / ED Course  I have reviewed the triage vital signs and the nursing notes.  Pertinent labs & imaging results that were available during my care of the patient were reviewed by me and considered in my medical decision making (see chart for details).   Had bladder irrigation with chemotherapy yesterday and some hematuria afterwards. Today with acute urianry retention for approx 10 hours. Catheter placed and large blood clot returned and afterwards hematuria that was seemed to be clearing with instant brelief of pain. Plan to leave foley in place and fu w/ uro.   Final Clinical Impressions(s) / ED Diagnoses   Final diagnoses:  Hematuria, unspecified type  Urinary retention      Merrily Pew, MD 12/28/16 [phone]

## 2016-12-28 NOTE — ED Triage Notes (Signed)
Pt complains of urinary retention. Pt states he had blood in urine after bladder cancer treatment yesterday. Pt states he initially peed blood and may have blockage keeping him from urinating.

## 2016-12-28 NOTE — ED Notes (Signed)
Bed: [facility] Expected date:  Expected time:  Means of arrival:  Comments: 

## 2016-12-30 DIAGNOSIS — R310 Gross hematuria: Secondary | ICD-10-CM | POA: Diagnosis not present

## 2017-01-02 DIAGNOSIS — R310 Gross hematuria: Secondary | ICD-10-CM | POA: Diagnosis not present

## 2017-01-02 DIAGNOSIS — R339 Retention of urine, unspecified: Secondary | ICD-10-CM | POA: Diagnosis not present

## 2017-01-30 ENCOUNTER — Ambulatory Visit: Payer: Medicare Other | Attending: Orthopedic Surgery | Admitting: Physical Therapy

## 2017-01-30 DIAGNOSIS — R2681 Unsteadiness on feet: Secondary | ICD-10-CM

## 2017-01-30 DIAGNOSIS — M6281 Muscle weakness (generalized): Secondary | ICD-10-CM | POA: Diagnosis not present

## 2017-01-30 NOTE — Therapy (Signed)
[facility] [place], [place], [address] Phone: [phone]   Fax:  [phone]  Physical Therapy Evaluation  Patient Details  Name: Joshua [name] MRN: [id number] Date of Birth: [birth year] Referring Provider: Gaynelle Arabian MD.  Encounter Date: 01/30/2017      PT End of Session - 01/30/17 1709    Visit Number 1   Number of Visits 8   Date for PT Re-Evaluation 02/27/17   PT Start Time 0104   PT Stop Time 0134   PT Time Calculation (min) 30 min   Activity Tolerance Patient tolerated treatment well   Behavior During Therapy [facility] for tasks assessed/performed      Past Medical History:  Diagnosis Date  . 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 Arthritis   . Bilateral lower extremity edema   . Bladder tumor   . CAD (coronary artery disease) cardiologist--  dr Daneen Schick   a. 04/27/2016 PCI with DES to RCA with 50% ostial to 60% segmental mid to distal left main, and 90% thrombus filled ostial to proximal circumflex, with distal right coronary filled by collaterals from left-to-right  . COPD with emphysema ([place])   . Dyspnea on exertion   . ED (erectile dysfunction)   . First degree AV block   . GERD (gastroesophageal reflux disease)   . History of adenomatous polyp of colon    09-22-2008  . History of bladder cancer    s/p  resection bladder tumor 04-19-2016  non-invasive low-grade urothelial carcinoma  . History of prostate cancer urologist-  dr Jeffie Pollock-- last PSA 0.02 (summer 2017)   dx 02/ 2013-- Stage T2a, Gleason 7, PSA 1.58--  s/p  radioactive prostate seed implants 01-19-2012  . History of ST elevation myocardial infarction (STEMI)    04-27-2016  . Hyperlipidemia   . Hypertension   . S/P drug eluting coronary stent placement    04-27-2016   . Second degree Mobitz I AV block    occasional w/ first degree heart block  per cardiologist note by dr Daneen Schick   . Seizures ([place]) per pt son -- no seizure's since 1980   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  arteriorvenous cerebral malformation s/p  craniotomy 1980  . Vitamin D deficiency     Past Surgical History:  Procedure Laterality Date  . APPENDECTOMY  2011  . BLEPHAROPLASTY Bilateral revision 02-17-2016  . CARDIAC CATHETERIZATION N/A 04/27/2016   Procedure: Left Heart Cath and Coronary Angiography;  Surgeon: Belva Crome, MD;  Location: [facility];  Service: Cardiovascular;  Laterality: N/A;  acute inferior wall STEMI ;  ostCx to midCx 90%,  ost1st Mrg to 1st Mrg 50%,  mid to distal RCA 100%,  pRCA 80%,  LVEF 45-50% w/ inferobasal akinesis  . CARDIAC CATHETERIZATION N/A 04/27/2016   Procedure: Temporary Pacemaker;  Surgeon: Belva Crome, MD;  Location: [facility];  Service: Cardiovascular;  Laterality: N/A;  mobitz 2  second degree HB w/ heart rate 29bpm  . CARDIAC CATHETERIZATION N/A 04/27/2016   Procedure: Coronary Stent Intervention;  Surgeon: Belva Crome, MD;  Location: [facility];  Service: Cardiovascular;  Laterality: N/A; DES x1 to  Proximal RCA;  DES x1 to Mid RCA  . CATARACT EXTRACTION W/ INTRAOCULAR LENS  IMPLANT, BILATERAL  2016  approx.  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 CYSTOSCOPY  01/19/2012   Procedure: CYSTOSCOPY;  Surgeon: Malka So, MD;  Location: [facility];  Service: Urology;;  no seeds found in bladder  . CYSTOSCOPY W/ RETROGRADES Bilateral 04/19/2016   Procedure: CYSTOSCOPY WITH BILATERAL RETROGRADE PYELOGRAM TRANSURETHRAL RESECTION OF BLADDER TUMOR ;  Surgeon: [name], MD;  [facility];  Service: Urology;  Laterality: Bilateral;  . CYSTOSCOPY WITH BIOPSY N/A 11/10/2016   Procedure: CYSTOSCOPY WITH BIOPSY AND FULGURATION;  Surgeon: [name], MD;  Location: [facility];  Service: Urology;  Laterality: N/A;  . RADIOACTIVE SEED IMPLANT  01/19/2012    Procedure: RADIOACTIVE SEED IMPLANT;  Surgeon: Malka So, MD;  Location: [facility];  Service: Urology;  Laterality: N/A;  68 seeds implanted  . TRANSTHORACIC ECHOCARDIOGRAM  06/02/2016   ef 60-65%/  trivial MR/  mild TR    There were no vitals filed for this visit.       Subjective Assessment - 01/30/17 1657    Subjective The patient presents to OPPT with a h/o falls.  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  He states he has another at home.  he reports 3 falls in the last 6 months and states that was was quite bad.     Patient Stated Goals I want to improve my balance.            [facility] PT Assessment - 01/30/17 0001      Assessment   Medical Diagnosis Gait disturbance; Imbalance.   Referring Provider Gaynelle Arabian MD.   Onset Date/Surgical Date --  Ongoing.     Precautions   Precautions Fall     Restrictions   Weight Bearing Restrictions No     Balance Screen   Has the patient fallen in the past 6 months Yes   How many times? --  3.   Has the patient had a decrease in activity level because of a fear of falling?  Yes   Is the patient reluctant to leave their home because of a fear of falling?  No     Home Ecologist residence     Prior Function   Level of Independence Independent with household mobility with device     Cognition   Overall Cognitive Status Within Functional Limits for tasks assessed     Posture/Postural Control   Posture/Postural Control Postural limitations   Postural Limitations Rounded Shoulders;Forward head;Decreased lumbar lordosis;Flexed trunk     [name] / Strength   AROM / PROM / Strength AROM;Strength     AROM   Overall AROM Comments Right ankle with donned AFO.  WFL for other LE joints.     Strength   Overall Strength Comments Right hip strength= 4- to 4/5; right knee strength= 4 to 4+/5 and left is 4+ to 5/5.     Ambulation/Gait    Gait Pattern Decreased step length - right;Decreased step length - left;Decreased stride length;Decreased hip/knee flexion - right;Decreased hip/knee flexion - left;Decreased dorsiflexion - right;Right foot flat;Scissoring;Narrow base of support;Poor foot clearance - left;Poor foot clearance - right     Standardized Balance Assessment   Standardized Balance Assessment Berg Balance Test     Berg Balance Test   Sit to Stand Able to stand  independently using hands   Standing Unsupported Able to stand 2 minutes with supervision   Sitting with Back Unsupported but Feet Supported on Floor or Stool  Able to sit safely and securely 2 minutes   Stand to Sit Sits safely with minimal use of hands   Transfers Able to transfer safely, minor use of hands   Standing Unsupported with Eyes Closed Able to stand 3 seconds   Standing Ubsupported with Feet Together Needs help to attain position but able to stand for 30 seconds with feet together   From Standing, Reach Forward with Outstretched Arm Can reach confidently >25 cm (10")   From Standing Position, Pick up Object from Floor Able to pick up shoe, needs supervision   From Standing Position, Turn to Look Behind Over each Shoulder Looks behind one side only/other side shows less weight shift   Turn 360 Degrees Able to turn 360 degrees safely one side only in 4 seconds or less   Standing Unsupported, Alternately Place Feet on Step/Stool Able to complete 4 steps without aid or supervision   Standing Unsupported, One Foot in Front Able to take small step independently and hold 30 seconds   Standing on One Leg Able to lift leg independently and hold equal to or more than 3 seconds   Total Score 40                             PT Short Term Goals - 01/30/17 1708      PT SHORT TERM GOAL #1   Time 2   Period Weeks   Status New           PT Long Term Goals - 01/30/17 1709      PT LONG TERM GOAL #1   Title Berg score= 49-50/56.    Time 4   Period Weeks   Status New     PT LONG TERM GOAL #2   Title Independent with a HEP.   Time 4   Period Weeks   Status New               Plan - 01/30/17 1706    Clinical Impression Statement Patient has a h/o falls.  He scored a 40/56 on the Berg balance test which is less than the last time he was evaluated.  He will benefit from skilled balance activites and training.   Rehab Potential Good   PT Frequency 2x / week   PT Duration 4 weeks   PT Treatment/Interventions ADLs/Self Care Home Management;Functional mobility training;Therapeutic activities;Therapeutic exercise;Balance training;Neuromuscular re-education   PT Next Visit Plan Balance training.      Patient will benefit from skilled therapeutic intervention in order to improve the following deficits and impairments:  Decreased activity tolerance, Abnormal gait, Decreased strength, Decreased balance  Visit Diagnosis: Unsteadiness on feet - Plan: PT plan of care cert/re-cert  Muscle weakness (generalized) - Plan: PT plan of care cert/re-cert      G-Codes - 41/32/44 1709    Functional Assessment Tool Used (Outpatient Only) Clinical judgement.   Functional Limitation Mobility: Walking and moving around   Mobility: Walking and Moving Around Current Status [phone]) At least 60 percent but less than 80 percent impaired, limited or restricted   Mobility: Walking and Moving Around Goal Status [phone]) At least 40 percent but less than 60 percent impaired, limited or restricted       Problem List Patient Active Problem List   Diagnosis Date Noted  . Aortic atherosclerosis ([place]) 06/03/2016  . General weakness   . Protein-calorie malnutrition, severe ([place])   . Bradycardia 06/02/2016  .  GI bleed 06/01/2016  . 1st degree AV block   . AV block, Mobitz 2 04/27/2016  . Old MI (myocardial infarction) 04/27/2016  . Bladder cancer ([place]) 04/18/2016  . Vitamin D deficiency 10/15/2015  . Prostate cancer ([place])   .  Hypercholesterolemia   . Essential hypertension   . Seizures ([place])   . GERD 08/22/2008  . COLONIC POLYPS, ADENOMATOUS, HX OF 08/22/2008    Doak Mah, [place] MPT 01/30/2017, 5:12 PM  [facility] [place], [place], [address] Phone: [phone]   Fax:  [phone]  Name: Joshua [name] MRN: [id number] Date of Birth: 1931/12/12

## 2017-02-02 ENCOUNTER — Ambulatory Visit: Payer: Medicare Other | Admitting: Physical Therapy

## 2017-02-02 ENCOUNTER — Encounter: Payer: Self-pay | Admitting: Physical Therapy

## 2017-02-02 DIAGNOSIS — M6281 Muscle weakness (generalized): Secondary | ICD-10-CM

## 2017-02-02 DIAGNOSIS — R2681 Unsteadiness on feet: Secondary | ICD-10-CM

## 2017-02-02 NOTE — Therapy (Signed)
[facility] [place], [place], [address] Phone: [phone]   Fax:  [phone]  Physical Therapy Treatment  Patient Details  Name: Joshua [name] MRN: [id number] Date of Birth: [birth year] Referring Provider: Gaynelle Arabian MD.  Encounter Date: 02/02/2017      PT End of Session - 02/02/17 1400    Visit Number 2   Number of Visits 8   Date for PT Re-Evaluation 02/27/17   PT Start Time 1318   PT Stop Time 1400   PT Time Calculation (min) 42 min   Activity Tolerance Patient tolerated treatment well   Behavior During Therapy [facility] for tasks assessed/performed      Past Medical History:  Diagnosis Date  . 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 Arthritis   . Bilateral lower extremity edema   . Bladder tumor   . CAD (coronary artery disease) cardiologist--  dr Daneen Schick   a. 04/27/2016 PCI with DES to RCA with 50% ostial to 60% segmental mid to distal left main, and 90% thrombus filled ostial to proximal circumflex, with distal right coronary filled by collaterals from left-to-right  . COPD with emphysema ([place])   . Dyspnea on exertion   . ED (erectile dysfunction)   . First degree AV block   . GERD (gastroesophageal reflux disease)   . History of adenomatous polyp of colon    09-22-2008  . History of bladder cancer    s/p  resection bladder tumor 04-19-2016  non-invasive low-grade urothelial carcinoma  . History of prostate cancer urologist-  dr Jeffie Pollock-- last PSA 0.02 (summer 2017)   dx 02/ 2013-- Stage T2a, Gleason 7, PSA 1.58--  s/p  radioactive prostate seed implants 01-19-2012  . History of ST elevation myocardial infarction (STEMI)    04-27-2016  . Hyperlipidemia   . Hypertension   . S/P drug eluting coronary stent placement    04-27-2016   . Second degree Mobitz I AV block    occasional w/ first degree heart block  per cardiologist note by dr Daneen Schick  .  Seizures ([place]) per pt son -- no seizure's since 1980   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  arteriorvenous cerebral malformation s/p  craniotomy 1980  . Vitamin D deficiency     Past Surgical History:  Procedure Laterality Date  . APPENDECTOMY  2011  . BLEPHAROPLASTY Bilateral revision 02-17-2016  . CARDIAC CATHETERIZATION N/A 04/27/2016   Procedure: Left Heart Cath and Coronary Angiography;  Surgeon: Belva Crome, MD;  Location: [place] [facility];  Service: Cardiovascular;  Laterality: N/A;  acute inferior wall STEMI ;  ostCx to midCx 90%,  ost1st Mrg to 1st Mrg 50%,  mid to distal RCA 100%,  pRCA 80%,  LVEF 45-50% w/ inferobasal akinesis  . CARDIAC CATHETERIZATION N/A 04/27/2016   Procedure: Temporary Pacemaker;  Surgeon: Belva Crome, MD;  Location: [place] [facility];  Service: Cardiovascular;  Laterality: N/A;  mobitz 2  second degree HB w/ heart rate 29bpm  . CARDIAC CATHETERIZATION N/A 04/27/2016   Procedure: Coronary Stent Intervention;  Surgeon: Belva Crome, MD;  Location: [place] [facility];  Service: Cardiovascular;  Laterality: N/A; DES x1 to  Proximal RCA;  DES x1 to Mid RCA  . CATARACT EXTRACTION W/ INTRAOCULAR LENS  IMPLANT, BILATERAL  2016  approx.  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 CYSTOSCOPY  01/19/2012   Procedure: CYSTOSCOPY;  Surgeon: Malka So, MD;  Location: [facility];  Service: Urology;;  no seeds found in bladder  . CYSTOSCOPY W/ RETROGRADES Bilateral 04/19/2016   Procedure: CYSTOSCOPY WITH BILATERAL RETROGRADE PYELOGRAM TRANSURETHRAL RESECTION OF BLADDER TUMOR ;  Surgeon: [name], MD;  [facility];  Service: Urology;  Laterality: Bilateral;  . CYSTOSCOPY WITH BIOPSY N/A 11/10/2016   Procedure: CYSTOSCOPY WITH BIOPSY AND FULGURATION;  Surgeon: [name], MD;  Location: [facility];  Service: Urology;  Laterality: N/A;  . RADIOACTIVE SEED IMPLANT  01/19/2012    Procedure: RADIOACTIVE SEED IMPLANT;  Surgeon: Malka So, MD;  Location: [facility];  Service: Urology;  Laterality: N/A;  68 seeds implanted  . TRANSTHORACIC ECHOCARDIOGRAM  06/02/2016   ef 60-65%/  trivial MR/  mild TR    There were no vitals filed for this visit.      Subjective Assessment - 02/02/17 1323    Subjective Patient reported doing well today and no further falls after the steps currently   Patient Stated Goals I want to improve my balance.   Currently in Pain? No/denies                         [facility] Adult PT Treatment/Exercise - 02/02/17 0001      Exercises   Exercises Lumbar     Lumbar Exercises: Aerobic   Stationary Bike Nustep L6x54min UE/LEactivity, monitored for progression     Lumbar Exercises: Seated   Long Arc Quad on Chair Strengthening;Right;3 sets;10 reps;Weights   LAQ on Chair Weights (lbs) 4             Balance Exercises - 02/02/17 1342      Balance Exercises: Standing   Standing Eyes Opened Narrow base of support (BOS);Wide (BOA);Solid surface;4 reps   Standing Eyes Closed Wide (BOA);Solid surface;1 rep   Standing, One Foot on a Step 6 inch;Eyes open;4 reps   Rockerboard Anterior/posterior  47min   Step Ups Forward;6 inch  2x10   Sit to Stand Time x10             PT Short Term Goals - 01/30/17 1708      PT SHORT TERM GOAL #1   Time 2   Period Weeks   Status New           PT Long Term Goals - 01/30/17 1709      PT LONG TERM GOAL #1   Title Berg score= 49-50/56.   Time 4   Period Weeks   Status New     PT LONG TERM GOAL #2   Title Independent with a HEP.   Time 4   Period Weeks   Status New               Plan - 02/02/17 1401    Clinical Impression Statement Patient tolerated treatment well today. Patient has minimal LOB with balance activities and able to recover with min assist. Patient has more difficulty with right LE SLS than left. Patient uses [facility] and understands  improtance of fall prevention. Patient current goals ongoing due to balance deficts.    Rehab Potential Good   PT Frequency 2x / week   PT Duration 4 weeks   PT Treatment/Interventions ADLs/Self Care Home Management;Functional mobility training;Therapeutic activities;Therapeutic exercise;Balance training;Neuromuscular re-education   PT Next Visit Plan cont with Balance training per MPT may consider right LE strengthening  Consulted and Agree with Plan of Care Patient      Patient will benefit from skilled therapeutic intervention in order to improve the following deficits and impairments:  Decreased activity tolerance, Abnormal gait, Decreased strength, Decreased balance  Visit Diagnosis: Unsteadiness on feet  Muscle weakness (generalized)     Problem List Patient Active Problem List   Diagnosis Date Noted  . Aortic atherosclerosis ([place]) 06/03/2016  . General weakness   . Protein-calorie malnutrition, severe ([place])   . Bradycardia 06/02/2016  . GI bleed 06/01/2016  . 1st degree AV block   . AV block, Mobitz 2 04/27/2016  . Old MI (myocardial infarction) 04/27/2016  . Bladder cancer ([place]) 04/18/2016  . Vitamin D deficiency 10/15/2015  . Prostate cancer ([place])   . Hypercholesterolemia   . Essential hypertension   . Seizures ([place])   . GERD 08/22/2008  . COLONIC POLYPS, ADENOMATOUS, HX OF 08/22/2008    DUNFORD, CHRISTINA P, PTA 02/02/2017, 2:04 PM  [facility] [place], [place], [address] Phone: [phone]   Fax:  [phone]  Name: Joshua [name] MRN: [id number] Date of Birth: 06/19/1932

## 2017-02-07 ENCOUNTER — Encounter: Payer: Self-pay | Admitting: Physical Therapy

## 2017-02-07 ENCOUNTER — Ambulatory Visit: Payer: Medicare Other | Admitting: Physical Therapy

## 2017-02-07 DIAGNOSIS — M6281 Muscle weakness (generalized): Secondary | ICD-10-CM | POA: Diagnosis not present

## 2017-02-07 DIAGNOSIS — R2681 Unsteadiness on feet: Secondary | ICD-10-CM | POA: Diagnosis not present

## 2017-02-07 NOTE — Therapy (Signed)
[name], [place], [address] Phone: [phone]   Fax:  [phone]  Physical Therapy Treatment  Patient Details  Name: Joshua [name] MRN: [id number] Date of Birth: 16-Oct-1931 Referring Provider: Gaynelle Arabian MD.  Encounter Date: 02/07/2017      PT End of Session - 02/07/17 1308    Visit Number 3   Number of Visits 8   Date for PT Re-Evaluation 02/27/17   PT Start Time 1307   PT Stop Time 1354   PT Time Calculation (min) 47 min   Activity Tolerance Patient tolerated treatment well   Behavior During Therapy [facility] for tasks assessed/performed      Past Medical History:  Diagnosis Date  . 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 Arthritis   . Bilateral lower extremity edema   . Bladder tumor   . CAD (coronary artery disease) cardiologist--  dr Daneen Schick   a. 04/27/2016 PCI with DES to RCA with 50% ostial to 60% segmental mid to distal left main, and 90% thrombus filled ostial to proximal circumflex, with distal right coronary filled by collaterals from left-to-right  . COPD with emphysema ([place])   . Dyspnea on exertion   . ED (erectile dysfunction)   . First degree AV block   . GERD (gastroesophageal reflux disease)   . History of adenomatous polyp of colon    09-22-2008  . History of bladder cancer    s/p  resection bladder tumor 04-19-2016  non-invasive low-grade urothelial carcinoma  . History of prostate cancer urologist-  dr Jeffie Pollock-- last PSA 0.02 (summer 2017)   dx 02/ 2013-- Stage T2a, Gleason 7, PSA 1.58--  s/p  radioactive prostate seed implants 01-19-2012  . History of ST elevation myocardial infarction (STEMI)    04-27-2016  . Hyperlipidemia   . Hypertension   . S/P drug eluting coronary stent placement    04-27-2016   . Second degree Mobitz I AV block    occasional w/ first degree heart block  per cardiologist note by dr Daneen Schick  .  Seizures ([place]) per pt son -- no seizure's since 1980   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  arteriorvenous cerebral malformation s/p  craniotomy 1980  . Vitamin D deficiency     Past Surgical History:  Procedure Laterality Date  . APPENDECTOMY  2011  . BLEPHAROPLASTY Bilateral revision 02-17-2016  . CARDIAC CATHETERIZATION N/A 04/27/2016   Procedure: Left Heart Cath and Coronary Angiography;  Surgeon: Belva Crome, MD;  Location: [place] [facility];  Service: Cardiovascular;  Laterality: N/A;  acute inferior wall STEMI ;  ostCx to midCx 90%,  ost1st Mrg to 1st Mrg 50%,  mid to distal RCA 100%,  pRCA 80%,  LVEF 45-50% w/ inferobasal akinesis  . CARDIAC CATHETERIZATION N/A 04/27/2016   Procedure: Temporary Pacemaker;  Surgeon: Belva Crome, MD;  Location: [place] [facility];  Service: Cardiovascular;  Laterality: N/A;  mobitz 2  second degree HB w/ heart rate 29bpm  . CARDIAC CATHETERIZATION N/A 04/27/2016   Procedure: Coronary Stent Intervention;  Surgeon: Belva Crome, MD;  Location: [place] [facility];  Service: Cardiovascular;  Laterality: N/A; DES x1 to  Proximal RCA;  DES x1 to Mid RCA  . CATARACT EXTRACTION W/ INTRAOCULAR LENS  IMPLANT, BILATERAL  2016  approx.  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 CYSTOSCOPY  01/19/2012   Procedure: CYSTOSCOPY;  Surgeon: Malka So, MD;  [facility];  Service: Urology;;  no seeds found in bladder  . CYSTOSCOPY W/ RETROGRADES Bilateral 04/19/2016   Procedure: CYSTOSCOPY WITH BILATERAL RETROGRADE PYELOGRAM TRANSURETHRAL RESECTION OF BLADDER TUMOR ;  Surgeon: [name], MD;  [facility];  Service: Urology;  Laterality: Bilateral;  . CYSTOSCOPY WITH BIOPSY N/A 11/10/2016   Procedure: CYSTOSCOPY WITH BIOPSY AND FULGURATION;  Surgeon: [name], MD;  Location: [facility];  Service: Urology;  Laterality: N/A;  . RADIOACTIVE SEED IMPLANT  01/19/2012    Procedure: RADIOACTIVE SEED IMPLANT;  Surgeon: Malka So, MD;  Location: [facility];  Service: Urology;  Laterality: N/A;  68 seeds implanted  . TRANSTHORACIC ECHOCARDIOGRAM  06/02/2016   ef 60-65%/  trivial MR/  mild TR    There were no vitals filed for this visit.      Subjective Assessment - 02/07/17 1308    Subjective Denies any new falls.   Patient Stated Goals I want to improve my balance.   Currently in Pain? Yes  No pain assessment provided by patient            [facility] PT Assessment - 02/07/17 0001      Assessment   Medical Diagnosis Gait disturbance; Imbalance.     Precautions   Precautions Fall     Restrictions   Weight Bearing Restrictions No                     [facility] Adult PT Treatment/Exercise - 02/07/17 0001      Exercises   Exercises Knee/Hip     Knee/Hip Exercises: Aerobic   Stationary Bike L2 x15 min     Knee/Hip Exercises: Machines for Strengthening   Cybex Knee Extension 10# 3x10 reps  Eccentrically lowering with RLE   Cybex Knee Flexion 30# 3x10 reps   Cybex Leg Press 5 pl, seat 9, 3x10 reps with eccentric lowering on RLE     Knee/Hip Exercises: Standing   Hip Flexion AROM;Both;2 sets;10 reps;Knee bent   Hip Abduction Stengthening;Both;2 sets;10 reps;Knee straight             Balance Exercises - 02/07/17 1356      Balance Exercises: Standing   Tandem Stance Eyes open;Intermittent upper extremity support  x3 min   SLS Eyes open;Solid surface;Intermittent upper extremity support;2 reps  3 x max trials    Rockerboard Lateral;EO;Intermittent UE support  x4 min             PT Short Term Goals - 01/30/17 1708      PT SHORT TERM GOAL #1   Time 2   Period Weeks   Status New           PT Long Term Goals - 01/30/17 1709      PT LONG TERM GOAL #1   Title Berg score= 49-50/56.   Time 4   Period Weeks   Status New     PT LONG TERM GOAL #2   Title Independent with a HEP.   Time 4    Period Weeks   Status New               Plan - 02/07/17 1358    Clinical Impression Statement Patient tolerated today's treatment well and demonstrated good RLE stability and control with eccentric strengthening. Patient ambulated into clinic with new staff that he describes as more stable. Standing hip  abduction completed with knee flexion although VC'd to complete in knee extension. Patient's SL stability declined as fatigue set in but patient able to tolerate well with intermittant UE support.    Rehab Potential Good   PT Frequency 2x / week   PT Duration 4 weeks   PT Treatment/Interventions ADLs/Self Care Home Management;Functional mobility training;Therapeutic activities;Therapeutic exercise;Balance training;Neuromuscular re-education   PT Next Visit Plan cont with Balance training per MPT may consider right LE strengthening    Consulted and Agree with Plan of Care Patient      Patient will benefit from skilled therapeutic intervention in order to improve the following deficits and impairments:  Decreased activity tolerance, Abnormal gait, Decreased strength, Decreased balance  Visit Diagnosis: Unsteadiness on feet  Muscle weakness (generalized)     Problem List Patient Active Problem List   Diagnosis Date Noted  . Aortic atherosclerosis ([place]) 06/03/2016  . General weakness   . Protein-calorie malnutrition, severe ([place])   . Bradycardia 06/02/2016  . GI bleed 06/01/2016  . 1st degree AV block   . AV block, Mobitz 2 04/27/2016  . Old MI (myocardial infarction) 04/27/2016  . Bladder cancer ([place]) 04/18/2016  . Vitamin D deficiency 10/15/2015  . Prostate cancer ([place])   . Hypercholesterolemia   . Essential hypertension   . Seizures ([place])   . GERD 08/22/2008  . COLONIC POLYPS, ADENOMATOUS, HX OF 08/22/2008    Wynelle Fanny, PTA 02/07/2017, 2:06 PM  [facility] [place], [place], [address] Phone:  [phone]   Fax:  [phone]  Name: COADY TRAIN MRN: [id number] Date of Birth: 1931-12-23

## 2017-02-10 ENCOUNTER — Ambulatory Visit: Payer: Medicare Other | Admitting: Physical Therapy

## 2017-02-10 ENCOUNTER — Encounter: Payer: Self-pay | Admitting: Physical Therapy

## 2017-02-10 DIAGNOSIS — M6281 Muscle weakness (generalized): Secondary | ICD-10-CM | POA: Diagnosis not present

## 2017-02-10 DIAGNOSIS — R2681 Unsteadiness on feet: Secondary | ICD-10-CM

## 2017-02-10 NOTE — Therapy (Signed)
[facility] [place], [place], [address] Phone: [phone]   Fax:  [phone]  Physical Therapy Treatment  Patient Details  Name: Joshua [name] MRN: [id number] Date of Birth: 1932-01-26 Referring Provider: Gaynelle Arabian MD.  Encounter Date: 02/10/2017      PT End of Session - 02/10/17 1230    Visit Number 4   Number of Visits 8   Date for PT Re-Evaluation 02/27/17   PT Start Time 1115   PT Stop Time 1205   PT Time Calculation (min) 50 min   Activity Tolerance Patient tolerated treatment well   Behavior During Therapy [facility] for tasks assessed/performed      Past Medical History:  Diagnosis Date  . 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 Arthritis   . Bilateral lower extremity edema   . Bladder tumor   . CAD (coronary artery disease) cardiologist--  dr Daneen Schick   a. 04/27/2016 PCI with DES to RCA with 50% ostial to 60% segmental mid to distal left main, and 90% thrombus filled ostial to proximal circumflex, with distal right coronary filled by collaterals from left-to-right  . COPD with emphysema ([place])   . Dyspnea on exertion   . ED (erectile dysfunction)   . First degree AV block   . GERD (gastroesophageal reflux disease)   . History of adenomatous polyp of colon    09-22-2008  . History of bladder cancer    s/p  resection bladder tumor 04-19-2016  non-invasive low-grade urothelial carcinoma  . History of prostate cancer urologist-  dr Jeffie Pollock-- last PSA 0.02 (summer 2017)   dx 02/ 2013-- Stage T2a, Gleason 7, PSA 1.58--  s/p  radioactive prostate seed implants 01-19-2012  . History of ST elevation myocardial infarction (STEMI)    04-27-2016  . Hyperlipidemia   . Hypertension   . S/P drug eluting coronary stent placement    04-27-2016   . Second degree Mobitz I AV block    occasional w/ first degree heart block  per cardiologist note by dr Daneen Schick  .  Seizures ([place]) per pt son -- no seizure's since 1980   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  arteriorvenous cerebral malformation s/p  craniotomy 1980  . Vitamin D deficiency     Past Surgical History:  Procedure Laterality Date  . APPENDECTOMY  2011  . BLEPHAROPLASTY Bilateral revision 02-17-2016  . CARDIAC CATHETERIZATION N/A 04/27/2016   Procedure: Left Heart Cath and Coronary Angiography;  Surgeon: Belva Crome, MD;  Location: [place] [facility];  Service: Cardiovascular;  Laterality: N/A;  acute inferior wall STEMI ;  ostCx to midCx 90%,  ost1st Mrg to 1st Mrg 50%,  mid to distal RCA 100%,  pRCA 80%,  LVEF 45-50% w/ inferobasal akinesis  . CARDIAC CATHETERIZATION N/A 04/27/2016   Procedure: Temporary Pacemaker;  Surgeon: Belva Crome, MD;  Location: [place] [facility];  Service: Cardiovascular;  Laterality: N/A;  mobitz 2  second degree HB w/ heart rate 29bpm  . CARDIAC CATHETERIZATION N/A 04/27/2016   Procedure: Coronary Stent Intervention;  Surgeon: Belva Crome, MD;  Location: [place] [facility];  Service: Cardiovascular;  Laterality: N/A; DES x1 to  Proximal RCA;  DES x1 to Mid RCA  . CATARACT EXTRACTION W/ INTRAOCULAR LENS  IMPLANT, BILATERAL  2016  approx.  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 CYSTOSCOPY  01/19/2012   Procedure: CYSTOSCOPY;  Surgeon: Malka So, MD;  Location[facility];  Service: Urology;;  no seeds found in bladder  . CYSTOSCOPY W/ RETROGRADES Bilateral 04/19/2016   Procedure: CYSTOSCOPY WITH BILATERAL RETROGRADE PYELOGRAM TRANSURETHRAL RESECTION OF BLADDER TUMOR ;  Surgeon: [name], MD;  [facility];  Service: Urology;  Laterality: Bilateral;  . CYSTOSCOPY WITH BIOPSY N/A 11/10/2016   Procedure: CYSTOSCOPY WITH BIOPSY AND FULGURATION;  Surgeon: [name], MD;  [facility];  Service: Urology;  Laterality: N/A;  . RADIOACTIVE SEED IMPLANT  01/19/2012    Procedure: RADIOACTIVE SEED IMPLANT;  Surgeon: Malka So, MD;  Location: [facility];  Service: Urology;  Laterality: N/A;  68 seeds implanted  . TRANSTHORACIC ECHOCARDIOGRAM  06/02/2016   ef 60-65%/  trivial MR/  mild TR    There were no vitals filed for this visit.      Subjective Assessment - 02/10/17 1228    Subjective No falls reported since last visit. Pt reported he was worried about his balance, but feels like it is improving.    Limitations Walking   Patient Stated Goals I want to improve my balance.   Currently in Pain? No/denies            [facility] PT Assessment - 02/10/17 0001      Assessment   Medical Diagnosis Gait disturbance; Imbalance.     Precautions   Precautions Fall     Restrictions   Weight Bearing Restrictions No                     [facility] Adult PT Treatment/Exercise - 02/10/17 0001      Exercises   Exercises Knee/Hip     Lumbar Exercises: Aerobic   Stationary Bike Nustep: L6 x 15 minutes     Knee/Hip Exercises: Machines for Strengthening   Total Gym Leg Press 3 plates L LE x 10, 1 plate R LE x 10, 3 plates bilateral LE's 20 reps     Knee/Hip Exercises: Standing   Hip Flexion AROM;Both;2 sets;10 reps;Knee bent   Hip Abduction Stengthening;Both;2 sets;10 reps;Knee straight             Balance Exercises - 02/10/17 1221      Balance Exercises: Standing   Tandem Stance Eyes open;Foam/compliant surface   Rockerboard Anterior/posterior;30 seconds;Intermittent UE support   Step Ups Forward;6 inch;UE support 2  10 x on each LE   Balance Beam Tandum stance on airmex beam holding each foot front for 1 minute x 3   Other Standing Exercises Small color balance ball tappiung with each LE with CGA for 5 minutes. Reaching to pick up 4 object off the floor with CGA for balance.            PT Education - 02/10/17 1229    Education provided Yes   Education Details Walking with heel to toe gait pattern.     Person(s) Educated Patient   Methods Explanation   Comprehension Verbalized understanding          PT Short Term Goals - 01/30/17 1708      PT SHORT TERM GOAL #1   Time 2   Period Weeks   Status New           PT Long Term Goals - 02/10/17 1230      PT LONG TERM GOAL #1   Title Berg score= 49-50/56.   Time 4  Period Weeks   Status New     PT LONG TERM GOAL #2   Title Independent with a HEP.   Time 4   Period Weeks   Status New               Plan - 02/10/17 1232    Clinical Impression Statement Patient tolerated therapy well today. Pt with more concentration on balance exercises and R LE strengthening. Pt required CGA for safety during dynamic balance exercises today. Skilled PT to continue to progress pt toward his goals set and incresaed balance for safety and prevent future falls.    Rehab Potential Good   PT Frequency 2x / week   PT Duration 4 weeks   PT Treatment/Interventions ADLs/Self Care Home Management;Functional mobility training;Therapeutic activities;Therapeutic exercise;Balance training;Neuromuscular re-education   PT Next Visit Plan cont with dynamic Balance training per MPT may consider right LE strengthening   Consulted and Agree with Plan of Care Patient      Patient will benefit from skilled therapeutic intervention in order to improve the following deficits and impairments:  Decreased activity tolerance, Abnormal gait, Decreased strength, Decreased balance  Visit Diagnosis: Unsteadiness on feet  Muscle weakness (generalized)     Problem List Patient Active Problem List   Diagnosis Date Noted  . Aortic atherosclerosis ([place]) 06/03/2016  . General weakness   . Protein-calorie malnutrition, severe ([place])   . Bradycardia 06/02/2016  . GI bleed 06/01/2016  . 1st degree AV block   . AV block, Mobitz 2 04/27/2016  . Old MI (myocardial infarction) 04/27/2016  . Bladder cancer ([place]) 04/18/2016  . Vitamin D deficiency 10/15/2015  .  Prostate cancer ([place])   . Hypercholesterolemia   . Essential hypertension   . Seizures ([place])   . GERD 08/22/2008  . COLONIC POLYPS, ADENOMATOUS, HX OF 08/22/2008    Oretha Caprice , MPT 02/10/2017, 12:34 PM  [facility] [place], [place], [address] Phone: [phone]   Fax:  [phone]  Name: OSIRIS ODRISCOLL MRN: [id number] Date of Birth: May 13, 1932

## 2017-02-14 ENCOUNTER — Ambulatory Visit: Payer: Medicare Other | Admitting: Physical Therapy

## 2017-02-14 ENCOUNTER — Encounter: Payer: Self-pay | Admitting: Physical Therapy

## 2017-02-14 DIAGNOSIS — R2681 Unsteadiness on feet: Secondary | ICD-10-CM | POA: Diagnosis not present

## 2017-02-14 DIAGNOSIS — M6281 Muscle weakness (generalized): Secondary | ICD-10-CM

## 2017-02-14 NOTE — Therapy (Signed)
[facility] [address], [place], [address] Phone: [phone]   Fax:  [phone]  Physical Therapy Treatment  Patient Details  Name: Joshua [name] MRN: [id number] Date of Birth: 21-Jan-1932 Referring Provider: Gaynelle Arabian MD.  Encounter Date: 02/14/2017      PT End of Session - 02/14/17 1306    Visit Number 5   Number of Visits 8   Date for PT Re-Evaluation 02/27/17   PT Start Time 1302   PT Stop Time 1352   PT Time Calculation (min) 50 min   Activity Tolerance Patient tolerated treatment well   Behavior During Therapy [facility] for tasks assessed/performed      Past Medical History:  Diagnosis Date  . 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 Arthritis   . Bilateral lower extremity edema   . Bladder tumor   . CAD (coronary artery disease) cardiologist--  dr Daneen Schick   a. 04/27/2016 PCI with DES to RCA with 50% ostial to 60% segmental mid to distal left main, and 90% thrombus filled ostial to proximal circumflex, with distal right coronary filled by collaterals from left-to-right  . COPD with emphysema ([place])   . Dyspnea on exertion   . ED (erectile dysfunction)   . First degree AV block   . GERD (gastroesophageal reflux disease)   . History of adenomatous polyp of colon    09-22-2008  . History of bladder cancer    s/p  resection bladder tumor 04-19-2016  non-invasive low-grade urothelial carcinoma  . History of prostate cancer urologist-  dr Jeffie Pollock-- last PSA 0.02 (summer 2017)   dx 02/ 2013-- Stage T2a, Gleason 7, PSA 1.58--  s/p  radioactive prostate seed implants 01-19-2012  . History of ST elevation myocardial infarction (STEMI)    04-27-2016  . Hyperlipidemia   . Hypertension   . S/P drug eluting coronary stent placement    04-27-2016   . Second degree Mobitz I AV block    occasional w/ first degree heart block  per cardiologist note by dr Daneen Schick  .  Seizures ([place]) per pt son -- no seizure's since 1980   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  arteriorvenous cerebral malformation s/p  craniotomy 1980  . Vitamin D deficiency     Past Surgical History:  Procedure Laterality Date  . APPENDECTOMY  2011  . BLEPHAROPLASTY Bilateral revision 02-17-2016  . CARDIAC CATHETERIZATION N/A 04/27/2016   Procedure: Left Heart Cath and Coronary Angiography;  Surgeon: Belva Crome, MD;  Location: [place] [facility];  Service: Cardiovascular;  Laterality: N/A;  acute inferior wall STEMI ;  ostCx to midCx 90%,  ost1st Mrg to 1st Mrg 50%,  mid to distal RCA 100%,  pRCA 80%,  LVEF 45-50% w/ inferobasal akinesis  . CARDIAC CATHETERIZATION N/A 04/27/2016   Procedure: Temporary Pacemaker;  Surgeon: Belva Crome, MD;  Location: [place] [facility];  Service: Cardiovascular;  Laterality: N/A;  mobitz 2  second degree HB w/ heart rate 29bpm  . CARDIAC CATHETERIZATION N/A 04/27/2016   Procedure: Coronary Stent Intervention;  Surgeon: Belva Crome, MD;  Location: [place] [facility];  Service: Cardiovascular;  Laterality: N/A; DES x1 to  Proximal RCA;  DES x1 to Mid RCA  . CATARACT EXTRACTION W/ INTRAOCULAR LENS  IMPLANT, BILATERAL  2016  approx.  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 CYSTOSCOPY  01/19/2012   Procedure: CYSTOSCOPY;  Surgeon: Malka So, MD;  Location: [facility];  Service: Urology;;  no seeds found in bladder  . CYSTOSCOPY W/ RETROGRADES Bilateral 04/19/2016   Procedure: CYSTOSCOPY WITH BILATERAL RETROGRADE PYELOGRAM TRANSURETHRAL RESECTION OF BLADDER TUMOR ;  Surgeon: [name], MD;  [facility];  Service: Urology;  Laterality: Bilateral;  . CYSTOSCOPY WITH BIOPSY N/A 11/10/2016   Procedure: CYSTOSCOPY WITH BIOPSY AND FULGURATION;  Surgeon: [name], MD;  Location: [facility];  Service: Urology;  Laterality: N/A;  . RADIOACTIVE SEED IMPLANT  01/19/2012    Procedure: RADIOACTIVE SEED IMPLANT;  Surgeon: Malka So, MD;  Location: [facility];  Service: Urology;  Laterality: N/A;  68 seeds implanted  . TRANSTHORACIC ECHOCARDIOGRAM  06/02/2016   ef 60-65%/  trivial MR/  mild TR    There were no vitals filed for this visit.      Subjective Assessment - 02/14/17 1306    Subjective No new complaints today.   Limitations Walking   Patient Stated Goals I want to improve my balance.   Currently in Pain? No/denies            [facility] PT Assessment - 02/14/17 0001      Assessment   Medical Diagnosis Gait disturbance; Imbalance.     Precautions   Precautions Fall     Restrictions   Weight Bearing Restrictions No                     [facility] Adult PT Treatment/Exercise - 02/14/17 0001      Knee/Hip Exercises: Aerobic   Stationary Bike L2 x16 min     Knee/Hip Exercises: Machines for Strengthening   Cybex Knee Extension 20# 3x10 reps   Cybex Knee Flexion 40# 3x10 reps   Total Gym Leg Press 3 pl, seat 7, 3x10 reps     Knee/Hip Exercises: Standing   Hip Flexion AROM;Both;2 sets;10 reps;Knee bent  1 UE support   Hip Abduction Stengthening;Both;2 sets;10 reps;Knee straight  1 UE support     Knee/Hip Exercises: Seated   Sit to Sand 20 reps;without UE support             Balance Exercises - 02/14/17 1332      Balance Exercises: Standing   Tandem Stance Eyes open;Foam/compliant surface;Intermittent upper extremity support  x2 min hold for each LE   Rockerboard Lateral;EO;Intermittent UE support  x3 min             PT Short Term Goals - 01/30/17 1708      PT SHORT TERM GOAL #1   Time 2   Period Weeks   Status New           PT Long Term Goals - 02/10/17 1230      PT LONG TERM GOAL #1   Title Berg score= 49-50/56.   Time 4   Period Weeks   Status New     PT LONG TERM GOAL #2   Title Independent with a HEP.   Time 4   Period Weeks   Status New               Plan -  02/14/17 1353    Clinical Impression Statement Patient tolerated today's treatment well as he reported no new falls recently. Patient instructed to complete standing AROM hip exercises with only one UE support to promote SLS as well as hip strengthening. Intermittant UE support still required  for standing balance on uneven/dynamic surfaces.   Rehab Potential Good   PT Frequency 2x / week   PT Duration 4 weeks   PT Treatment/Interventions ADLs/Self Care Home Management;Functional mobility training;Therapeutic activities;Therapeutic exercise;Balance training;Neuromuscular re-education   PT Next Visit Plan cont with dynamic Balance training per MPT may consider right LE strengthening   Consulted and Agree with Plan of Care Patient      Patient will benefit from skilled therapeutic intervention in order to improve the following deficits and impairments:  Decreased activity tolerance, Abnormal gait, Decreased strength, Decreased balance  Visit Diagnosis: Unsteadiness on feet  Muscle weakness (generalized)     Problem List Patient Active Problem List   Diagnosis Date Noted  . Aortic atherosclerosis ([place]) 06/03/2016  . General weakness   . Protein-calorie malnutrition, severe ([place])   . Bradycardia 06/02/2016  . GI bleed 06/01/2016  . 1st degree AV block   . AV block, Mobitz 2 04/27/2016  . Old MI (myocardial infarction) 04/27/2016  . Bladder cancer ([place]) 04/18/2016  . Vitamin D deficiency 10/15/2015  . Prostate cancer ([place])   . Hypercholesterolemia   . Essential hypertension   . Seizures ([place])   . GERD 08/22/2008  . COLONIC POLYPS, ADENOMATOUS, HX OF 08/22/2008    Wynelle Fanny, PTA 02/14/2017, 1:56 PM  [facility] [place], [place], [address] Phone: [phone]   Fax:  [phone]  Name: Joshua [name] MRN: [id number] Date of Birth: 08-02-1932

## 2017-02-16 ENCOUNTER — Encounter: Payer: Self-pay | Admitting: Physical Therapy

## 2017-02-16 ENCOUNTER — Ambulatory Visit: Payer: Medicare Other | Admitting: Physical Therapy

## 2017-02-16 DIAGNOSIS — R2681 Unsteadiness on feet: Secondary | ICD-10-CM | POA: Diagnosis not present

## 2017-02-16 DIAGNOSIS — M6281 Muscle weakness (generalized): Secondary | ICD-10-CM

## 2017-02-16 NOTE — Therapy (Signed)
[facility] [place], [place], [address] Phone: [phone]   Fax:  [phone]  Physical Therapy Treatment  Patient Details  Name: Joshua [name] MRN: [id number] Date of Birth: [birth year] Referring Provider: Gaynelle Arabian MD.  Encounter Date: 02/16/2017      PT End of Session - 02/16/17 1306    Visit Number 6   Number of Visits 8   Date for PT Re-Evaluation 02/27/17   PT Start Time 0258   PT Stop Time 1348   PT Time Calculation (min) 42 min   Activity Tolerance Patient tolerated treatment well   Behavior During Therapy [facility] for tasks assessed/performed      Past Medical History:  Diagnosis Date  . 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 Arthritis   . Bilateral lower extremity edema   . Bladder tumor   . CAD (coronary artery disease) cardiologist--  dr Daneen Schick   a. 04/27/2016 PCI with DES to RCA with 50% ostial to 60% segmental mid to distal left main, and 90% thrombus filled ostial to proximal circumflex, with distal right coronary filled by collaterals from left-to-right  . COPD with emphysema ([place])   . Dyspnea on exertion   . ED (erectile dysfunction)   . First degree AV block   . GERD (gastroesophageal reflux disease)   . History of adenomatous polyp of colon    09-22-2008  . History of bladder cancer    s/p  resection bladder tumor 04-19-2016  non-invasive low-grade urothelial carcinoma  . History of prostate cancer urologist-  dr Jeffie Pollock-- last PSA 0.02 (summer 2017)   dx 02/ 2013-- Stage T2a, Gleason 7, PSA 1.58--  s/p  radioactive prostate seed implants 01-19-2012  . History of ST elevation myocardial infarction (STEMI)    04-27-2016  . Hyperlipidemia   . Hypertension   . S/P drug eluting coronary stent placement    04-27-2016   . Second degree Mobitz I AV block    occasional w/ first degree heart block  per cardiologist note by dr Daneen Schick  .  Seizures ([place]) per pt son -- no seizure's since 1980   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  arteriorvenous cerebral malformation s/p  craniotomy 1980  . Vitamin D deficiency     Past Surgical History:  Procedure Laterality Date  . APPENDECTOMY  2011  . BLEPHAROPLASTY Bilateral revision 02-17-2016  . CARDIAC CATHETERIZATION N/A 04/27/2016   Procedure: Left Heart Cath and Coronary Angiography;  Surgeon: Belva Crome, MD;  Location: [place] [facility];  Service: Cardiovascular;  Laterality: N/A;  acute inferior wall STEMI ;  ostCx to midCx 90%,  ost1st Mrg to 1st Mrg 50%,  mid to distal RCA 100%,  pRCA 80%,  LVEF 45-50% w/ inferobasal akinesis  . CARDIAC CATHETERIZATION N/A 04/27/2016   Procedure: Temporary Pacemaker;  Surgeon: Belva Crome, MD;  Location: [place] [facility];  Service: Cardiovascular;  Laterality: N/A;  mobitz 2  second degree HB w/ heart rate 29bpm  . CARDIAC CATHETERIZATION N/A 04/27/2016   Procedure: Coronary Stent Intervention;  Surgeon: Belva Crome, MD;  Location: [place] [facility];  Service: Cardiovascular;  Laterality: N/A; DES x1 to  Proximal RCA;  DES x1 to Mid RCA  . CATARACT EXTRACTION W/ INTRAOCULAR LENS  IMPLANT, BILATERAL  2016  approx.  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 CYSTOSCOPY  01/19/2012   Procedure: CYSTOSCOPY;  Surgeon: Malka So, MD;  Location: [facility];  Service: Urology;;  no seeds found in bladder  . CYSTOSCOPY W/ RETROGRADES Bilateral 04/19/2016   Procedure: CYSTOSCOPY WITH BILATERAL RETROGRADE PYELOGRAM TRANSURETHRAL RESECTION OF BLADDER TUMOR ;  Surgeon: [name], MD;  [facility];  Service: Urology;  Laterality: Bilateral;  . CYSTOSCOPY WITH BIOPSY N/A 11/10/2016   Procedure: CYSTOSCOPY WITH BIOPSY AND FULGURATION;  Surgeon: [name], MD;  Location: [facility];  Service: Urology;  Laterality: N/A;  . RADIOACTIVE SEED IMPLANT  01/19/2012    Procedure: RADIOACTIVE SEED IMPLANT;  Surgeon: Malka So, MD;  [facility];  Service: Urology;  Laterality: N/A;  68 seeds implanted  . TRANSTHORACIC ECHOCARDIOGRAM  06/02/2016   ef 60-65%/  trivial MR/  mild TR    There were no vitals filed for this visit.      Subjective Assessment - 02/16/17 1306    Subjective Denies any pain or any falls.   Limitations Walking   Patient Stated Goals I want to improve my balance.   Currently in Pain? No/denies            [facility] PT Assessment - 02/16/17 0001      Assessment   Medical Diagnosis Gait disturbance; Imbalance.     Precautions   Precautions Fall     Restrictions   Weight Bearing Restrictions No                     [facility] Adult PT Treatment/Exercise - 02/16/17 0001      Knee/Hip Exercises: Aerobic   Stationary Bike L2 x6 min   Nustep L6 x12 min     Knee/Hip Exercises: Machines for Strengthening   Cybex Knee Extension 20# 3x10 reps   Cybex Knee Flexion 40# 3x10 reps   Total Gym Leg Press 3 pl, seat 8, 3x10 reps             Balance Exercises - 02/16/17 1352      Balance Exercises: Standing   Standing Eyes Opened Narrow base of support (BOS);Foam/compliant surface  x3 min on BOSU   Tandem Stance Eyes open;Foam/compliant surface;Intermittent upper extremity support  x3 min   SLS Eyes open;Solid surface;Upper extremity support 2  SLS for contralateral balance pod touches for BLE             PT Short Term Goals - 01/30/17 1708      PT SHORT TERM GOAL #1   Time 2   Period Weeks   Status New           PT Long Term Goals - 02/10/17 1230      PT LONG TERM GOAL #1   Title Berg score= 49-50/56.   Time 4   Period Weeks   Status New     PT LONG TERM GOAL #2   Title Independent with a HEP.   Time 4   Period Weeks   Status New               Plan - 02/16/17 1354    Clinical Impression Statement Patient continues to do well in treatment with no  complaints of discomfort or pain. Patient continues to complete machine knee strengthening with eccentric control. Patient had difficulty with LLE SLS activites with RLE reach to balance pods due to lack of full control of RLE.   Rehab Potential Good   PT Frequency 2x /  week   PT Duration 4 weeks   PT Treatment/Interventions ADLs/Self Care Home Management;Functional mobility training;Therapeutic activities;Therapeutic exercise;Balance training;Neuromuscular re-education   PT Next Visit Plan cont with dynamic Balance training per MPT may consider right LE strengthening   Consulted and Agree with Plan of Care Patient      Patient will benefit from skilled therapeutic intervention in order to improve the following deficits and impairments:  Decreased activity tolerance, Abnormal gait, Decreased strength, Decreased balance  Visit Diagnosis: Unsteadiness on feet  Muscle weakness (generalized)     Problem List Patient Active Problem List   Diagnosis Date Noted  . Aortic atherosclerosis ([place]) 06/03/2016  . General weakness   . Protein-calorie malnutrition, severe ([place])   . Bradycardia 06/02/2016  . GI bleed 06/01/2016  . 1st degree AV block   . AV block, Mobitz 2 04/27/2016  . Old MI (myocardial infarction) 04/27/2016  . Bladder cancer ([place]) 04/18/2016  . Vitamin D deficiency 10/15/2015  . Prostate cancer ([place])   . Hypercholesterolemia   . Essential hypertension   . Seizures ([place])   . GERD 08/22/2008  . COLONIC POLYPS, ADENOMATOUS, HX OF 08/22/2008    Wynelle Fanny, PTA 02/16/2017, 1:59 PM  [facility] [place], [place], [address] Phone: [phone]   Fax:  [phone]  Name: Joshua [name] MRN: [id number] Date of Birth: 11/29/1931

## 2017-02-20 ENCOUNTER — Ambulatory Visit: Payer: Medicare Other | Attending: Orthopedic Surgery | Admitting: *Deleted

## 2017-02-20 DIAGNOSIS — M6281 Muscle weakness (generalized): Secondary | ICD-10-CM

## 2017-02-20 DIAGNOSIS — R2681 Unsteadiness on feet: Secondary | ICD-10-CM | POA: Diagnosis not present

## 2017-02-20 NOTE — Therapy (Signed)
[facility] [place], [place], [address] Phone: [phone]   Fax:  [phone]  Physical Therapy Treatment  Patient Details  Name: BRANDLEY ALDRETE MRN: [id number] Date of Birth: 1932/04/09 Referring Provider: Gaynelle Arabian MD.  Encounter Date: 02/20/2017      PT End of Session - 02/20/17 1441    Visit Number 7   Number of Visits 8   Date for PT Re-Evaluation 02/27/17   PT Start Time 6237   PT Stop Time 1434   PT Time Calculation (min) 49 min      Past Medical History:  Diagnosis Date  . 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 Arthritis   . Bilateral lower extremity edema   . Bladder tumor   . CAD (coronary artery disease) cardiologist--  dr Daneen Schick   a. 04/27/2016 PCI with DES to RCA with 50% ostial to 60% segmental mid to distal left main, and 90% thrombus filled ostial to proximal circumflex, with distal right coronary filled by collaterals from left-to-right  . COPD with emphysema ([place])   . Dyspnea on exertion   . ED (erectile dysfunction)   . First degree AV block   . GERD (gastroesophageal reflux disease)   . History of adenomatous polyp of colon    09-22-2008  . History of bladder cancer    s/p  resection bladder tumor 04-19-2016  non-invasive low-grade urothelial carcinoma  . History of prostate cancer urologist-  dr Jeffie Pollock-- last PSA 0.02 (summer 2017)   dx 02/ 2013-- Stage T2a, Gleason 7, PSA 1.58--  s/p  radioactive prostate seed implants 01-19-2012  . History of ST elevation myocardial infarction (STEMI)    04-27-2016  . Hyperlipidemia   . Hypertension   . S/P drug eluting coronary stent placement    04-27-2016   . Second degree Mobitz I AV block    occasional w/ first degree heart block  per cardiologist note by dr Daneen Schick  . Seizures ([place]) per pt son -- no seizure's since 1980   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   arteriorvenous cerebral malformation s/p  craniotomy 1980  . Vitamin D deficiency     Past Surgical History:  Procedure Laterality Date  . APPENDECTOMY  2011  . BLEPHAROPLASTY Bilateral revision 02-17-2016  . CARDIAC CATHETERIZATION N/A 04/27/2016   Procedure: Left Heart Cath and Coronary Angiography;  Surgeon: Belva Crome, MD;  Location: [place] [facility];  Service: Cardiovascular;  Laterality: N/A;  acute inferior wall STEMI ;  ostCx to midCx 90%,  ost1st Mrg to 1st Mrg 50%,  mid to distal RCA 100%,  pRCA 80%,  LVEF 45-50% w/ inferobasal akinesis  . CARDIAC CATHETERIZATION N/A 04/27/2016   Procedure: Temporary Pacemaker;  Surgeon: Belva Crome, MD;  Location: [place] [facility];  Service: Cardiovascular;  Laterality: N/A;  mobitz 2  second degree HB w/ heart rate 29bpm  . CARDIAC CATHETERIZATION N/A 04/27/2016   Procedure: Coronary Stent Intervention;  Surgeon: Belva Crome, MD;  Location: [place] [facility];  Service: Cardiovascular;  Laterality: N/A; DES x1 to  Proximal RCA;  DES x1 to Mid RCA  . CATARACT EXTRACTION W/ INTRAOCULAR LENS  IMPLANT, BILATERAL  2016  approx.  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 CYSTOSCOPY  01/19/2012   Procedure: CYSTOSCOPY;  Surgeon: Malka So, MD;  Location: [facility];  Service: Urology;;  no seeds found in bladder  . CYSTOSCOPY W/ RETROGRADES Bilateral 04/19/2016   Procedure: CYSTOSCOPY WITH BILATERAL RETROGRADE PYELOGRAM TRANSURETHRAL RESECTION OF BLADDER TUMOR ;  Surgeon: [name], MD;  [facility];  Service: Urology;  Laterality: Bilateral;  . CYSTOSCOPY WITH BIOPSY N/A 11/10/2016   Procedure: CYSTOSCOPY WITH BIOPSY AND FULGURATION;  Surgeon: [name], MD;  [facility];  Service: Urology;  Laterality: N/A;  . RADIOACTIVE SEED IMPLANT  01/19/2012   Procedure: RADIOACTIVE SEED IMPLANT;  Surgeon: Malka So, MD;  Location: [facility];   Service: Urology;  Laterality: N/A;  68 seeds implanted  . TRANSTHORACIC ECHOCARDIOGRAM  06/02/2016   ef 60-65%/  trivial MR/  mild TR    There were no vitals filed for this visit.      Subjective Assessment - 02/20/17 1406    Subjective Denies any pain or any falls.   Limitations Walking   Patient Stated Goals I want to improve my balance.   Currently in Pain? No/denies                         [facility] Adult PT Treatment/Exercise - 02/20/17 0001      Exercises   Exercises Knee/Hip     Lumbar Exercises: Aerobic   Stationary Bike Nustep: L6 x 20 minutes     Knee/Hip Exercises: Machines for Strengthening   Cybex Knee Extension 20# x 5 mins   Cybex Knee Flexion 40# x 5 mins   Total Gym Leg Press 3 pl, seat 8,x 5 mins             Balance Exercises - 02/20/17 1435      Balance Exercises: Standing   Other Standing Exercises 5 mins on inverted BOSU             PT Short Term Goals - 01/30/17 1708      PT SHORT TERM GOAL #1   Time 2   Period Weeks   Status New           PT Long Term Goals - 02/10/17 1230      PT LONG TERM GOAL #1   Title Berg score= 49-50/56.   Time 4   Period Weeks   Status New     PT LONG TERM GOAL #2   Title Independent with a HEP.   Time 4   Period Weeks   Status New               Plan - 02/20/17 1614    Clinical Impression Statement Pt arrived to clinic today feeling minimal pain and doing better overall. He was able to add repititions to strengthening exs today and was mainly fatigued by the end of the Rx.. RT LE control and strength continues to improve.   Rehab Potential Good   PT Frequency 2x / week   PT Duration 4 weeks   PT Treatment/Interventions ADLs/Self Care Home Management;Functional mobility training;Therapeutic activities;Therapeutic exercise;Balance training;Neuromuscular re-education   PT Next Visit Plan cont with dynamic Balance training per MPT may consider right LE strengthening    Consulted and Agree with Plan of Care Patient      Patient will benefit from skilled therapeutic intervention in order to improve the following deficits and impairments:  Decreased activity tolerance, Abnormal gait, Decreased strength, Decreased balance  Visit Diagnosis: Unsteadiness on feet  Muscle weakness (generalized)     Problem List Patient  Active Problem List   Diagnosis Date Noted  . Aortic atherosclerosis ([place]) 06/03/2016  . General weakness   . Protein-calorie malnutrition, severe ([place])   . Bradycardia 06/02/2016  . GI bleed 06/01/2016  . 1st degree AV block   . AV block, Mobitz 2 04/27/2016  . Old MI (myocardial infarction) 04/27/2016  . Bladder cancer ([place]) 04/18/2016  . Vitamin D deficiency 10/15/2015  . Prostate cancer ([place])   . Hypercholesterolemia   . Essential hypertension   . Seizures ([place])   . GERD 08/22/2008  . COLONIC POLYPS, ADENOMATOUS, HX OF 08/22/2008    Jessilynn Taft,CHRIS, PTA 02/20/2017, 4:21 PM  [facility] [place], [place], [address] Phone: [phone]   Fax:  [phone]  Name: INAKI VANTINE MRN: [id number] Date of Birth: [birth year]

## 2017-02-23 ENCOUNTER — Ambulatory Visit: Payer: Medicare Other | Admitting: Physical Therapy

## 2017-02-23 DIAGNOSIS — R2681 Unsteadiness on feet: Secondary | ICD-10-CM | POA: Diagnosis not present

## 2017-02-23 DIAGNOSIS — M6281 Muscle weakness (generalized): Secondary | ICD-10-CM | POA: Diagnosis not present

## 2017-02-23 DIAGNOSIS — D485 Neoplasm of uncertain behavior of skin: Secondary | ICD-10-CM | POA: Diagnosis not present

## 2017-02-23 DIAGNOSIS — L570 Actinic keratosis: Secondary | ICD-10-CM | POA: Diagnosis not present

## 2017-02-23 NOTE — Therapy (Signed)
[facility] [place], [place], [address] Phone: [phone]   Fax:  [phone]  Physical Therapy Treatment  Patient Details  Name: Joshua [name] MRN: [id number] Date of Birth: [birth year] Referring Provider: Gaynelle Arabian MD.  Encounter Date: 02/23/2017      PT End of Session - 02/23/17 1412    Visit Number 8   Number of Visits 8   Date for PT Re-Evaluation 02/27/17   PT Start Time 0151   PT Stop Time 0237   PT Time Calculation (min) 46 min   Activity Tolerance Patient tolerated treatment well   Behavior During Therapy [facility] for tasks assessed/performed      Past Medical History:  Diagnosis Date  . 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 Arthritis   . Bilateral lower extremity edema   . Bladder tumor   . CAD (coronary artery disease) cardiologist--  dr Daneen Schick   a. 04/27/2016 PCI with DES to RCA with 50% ostial to 60% segmental mid to distal left main, and 90% thrombus filled ostial to proximal circumflex, with distal right coronary filled by collaterals from left-to-right  . COPD with emphysema ([place])   . Dyspnea on exertion   . ED (erectile dysfunction)   . First degree AV block   . GERD (gastroesophageal reflux disease)   . History of adenomatous polyp of colon    09-22-2008  . History of bladder cancer    s/p  resection bladder tumor 04-19-2016  non-invasive low-grade urothelial carcinoma  . History of prostate cancer urologist-  dr Jeffie Pollock-- last PSA 0.02 (summer 2017)   dx 02/ 2013-- Stage T2a, Gleason 7, PSA 1.58--  s/p  radioactive prostate seed implants 01-19-2012  . History of ST elevation myocardial infarction (STEMI)    04-27-2016  . Hyperlipidemia   . Hypertension   . S/P drug eluting coronary stent placement    04-27-2016   . Second degree Mobitz I AV block    occasional w/ first degree heart block  per cardiologist note by dr Daneen Schick  .  Seizures ([place]) per pt son -- no seizure's since 1980   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  arteriorvenous cerebral malformation s/p  craniotomy 1980  . Vitamin D deficiency     Past Surgical History:  Procedure Laterality Date  . APPENDECTOMY  2011  . BLEPHAROPLASTY Bilateral revision 02-17-2016  . CARDIAC CATHETERIZATION N/A 04/27/2016   Procedure: Left Heart Cath and Coronary Angiography;  Surgeon: Belva Crome, MD;  Location: [place] [facility];  Service: Cardiovascular;  Laterality: N/A;  acute inferior wall STEMI ;  ostCx to midCx 90%,  ost1st Mrg to 1st Mrg 50%,  mid to distal RCA 100%,  pRCA 80%,  LVEF 45-50% w/ inferobasal akinesis  . CARDIAC CATHETERIZATION N/A 04/27/2016   Procedure: Temporary Pacemaker;  Surgeon: Belva Crome, MD;  Location: [place] [facility];  Service: Cardiovascular;  Laterality: N/A;  mobitz 2  second degree HB w/ heart rate 29bpm  . CARDIAC CATHETERIZATION N/A 04/27/2016   Procedure: Coronary Stent Intervention;  Surgeon: Belva Crome, MD;  Location: [place] [facility];  Service: Cardiovascular;  Laterality: N/A; DES x1 to  Proximal RCA;  DES x1 to Mid RCA  . CATARACT EXTRACTION W/ INTRAOCULAR LENS  IMPLANT, BILATERAL  2016  approx.  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 CYSTOSCOPY  01/19/2012   Procedure: CYSTOSCOPY;  Surgeon: Malka So, MD;  Location: [facility];  Service: Urology;;  no seeds found in bladder  . CYSTOSCOPY W/ RETROGRADES Bilateral 04/19/2016   Procedure: CYSTOSCOPY WITH BILATERAL RETROGRADE PYELOGRAM TRANSURETHRAL RESECTION OF BLADDER TUMOR ;  Surgeon: [name], MD;  Location[facility];  Service: Urology;  Laterality: Bilateral;  . CYSTOSCOPY WITH BIOPSY N/A 11/10/2016   Procedure: CYSTOSCOPY WITH BIOPSY AND FULGURATION;  Surgeon: [name], MD;  Location: [facility];  Service: Urology;  Laterality: N/A;  . RADIOACTIVE SEED IMPLANT  01/19/2012    Procedure: RADIOACTIVE SEED IMPLANT;  Surgeon: Malka So, MD;  Location: [facility];  Service: Urology;  Laterality: N/A;  68 seeds implanted  . TRANSTHORACIC ECHOCARDIOGRAM  06/02/2016   ef 60-65%/  trivial MR/  mild TR    There were no vitals filed for this visit.      Subjective Assessment - 02/23/17 1414    Subjective I feel my balance is improving.   Patient Stated Goals I want to improve my balance.   Currently in Pain? No/denies                         [facility] Adult PT Treatment/Exercise - 02/23/17 0001      Exercises   Exercises Knee/Hip     Lumbar Exercises: Aerobic   Stationary Bike Nustep level 6 x 17 minutes.     Lumbar Exercises: Machines for Strengthening   Leg Press 2 1/2 plates x 5 minutes.     Knee/Hip Exercises: Machines for Strengthening   Cybex Knee Extension 10# x 4 minutes.   Cybex Knee Flexion 30# x 4 minutes.             Balance Exercises - 02/23/17 1415      Balance Exercises: Standing   Rockerboard --  5 minutes.   Other Standing Exercises Inverted BOSU x 5 minutes.             PT Short Term Goals - 01/30/17 1708      PT SHORT TERM GOAL #1   Time 2   Period Weeks   Status New           PT Long Term Goals - 02/10/17 1230      PT LONG TERM GOAL #1   Title Berg score= 49-50/56.   Time 4   Period Weeks   Status New     PT LONG TERM GOAL #2   Title Independent with a HEP.   Time 4   Period Weeks   Status New               Plan - 02/23/17 1437    Clinical Impression Statement Excellent job with balance today.  Patient performed activites in parallel bars for safety and was able to stand several minutes with hand use.      Patient will benefit from skilled therapeutic intervention in order to improve the following deficits and impairments:  Decreased activity tolerance, Abnormal gait, Decreased strength, Decreased balance  Visit Diagnosis: Unsteadiness on feet  Muscle  weakness (generalized)     Problem List Patient Active Problem List   Diagnosis Date Noted  . Aortic atherosclerosis ([place]) 06/03/2016  . General weakness   . Protein-calorie malnutrition, severe ([place])   . Bradycardia 06/02/2016  . GI bleed 06/01/2016  . 1st degree AV block   . AV block, Mobitz 2 04/27/2016  .  Old MI (myocardial infarction) 04/27/2016  . Bladder cancer ([place]) 04/18/2016  . Vitamin D deficiency 10/15/2015  . Prostate cancer ([place])   . Hypercholesterolemia   . Essential hypertension   . Seizures ([place])   . GERD 08/22/2008  . COLONIC POLYPS, ADENOMATOUS, HX OF 08/22/2008    [place], [place] MPT 02/23/2017, 2:41 PM  [facility] [place], [place], [address] Phone: [phone]   Fax:  [phone]  Name: Joshua [name] MRN: [id number] Date of Birth: Jun 18, 1932

## 2017-02-28 ENCOUNTER — Ambulatory Visit: Payer: Medicare Other | Admitting: Physical Therapy

## 2017-02-28 ENCOUNTER — Encounter: Payer: Self-pay | Admitting: Physical Therapy

## 2017-02-28 DIAGNOSIS — R2681 Unsteadiness on feet: Secondary | ICD-10-CM | POA: Diagnosis not present

## 2017-02-28 DIAGNOSIS — M6281 Muscle weakness (generalized): Secondary | ICD-10-CM | POA: Diagnosis not present

## 2017-02-28 NOTE — Therapy (Signed)
[facility] [place], [place], [address] Phone: [phone]   Fax:  [phone]  Physical Therapy Treatment Progress Note  Patient Details  Name: Joshua [name] MRN: [id number] Date of Birth: 11-16-1931 Referring Provider: Darryl Nestle MD  Encounter Date: 02/28/2017      PT End of Session - 02/28/17 1312    Visit Number 9   Number of Visits 16   Date for PT Re-Evaluation 04/29/17   PT Start Time 1300   PT Stop Time 1345   PT Time Calculation (min) 45 min   Activity Tolerance Patient tolerated treatment well   Behavior During Therapy [facility] for tasks assessed/performed      Past Medical History:  Diagnosis Date  . 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 Arthritis   . Bilateral lower extremity edema   . Bladder tumor   . CAD (coronary artery disease) cardiologist--  dr Daneen Schick   a. 04/27/2016 PCI with DES to RCA with 50% ostial to 60% segmental mid to distal left main, and 90% thrombus filled ostial to proximal circumflex, with distal right coronary filled by collaterals from left-to-right  . COPD with emphysema ([place])   . Dyspnea on exertion   . ED (erectile dysfunction)   . First degree AV block   . GERD (gastroesophageal reflux disease)   . History of adenomatous polyp of colon    09-22-2008  . History of bladder cancer    s/p  resection bladder tumor 04-19-2016  non-invasive low-grade urothelial carcinoma  . History of prostate cancer urologist-  dr Jeffie Pollock-- last PSA 0.02 (summer 2017)   dx 02/ 2013-- Stage T2a, Gleason 7, PSA 1.58--  s/p  radioactive prostate seed implants 01-19-2012  . History of ST elevation myocardial infarction (STEMI)    04-27-2016  . Hyperlipidemia   . Hypertension   . S/P drug eluting coronary stent placement    04-27-2016   . Second degree Mobitz I AV block    occasional w/ first degree heart block  per cardiologist note by dr  Daneen Schick  . Seizures ([place]) per pt son -- no seizure's since 1980   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  arteriorvenous cerebral malformation s/p  craniotomy 1980  . Vitamin D deficiency     Past Surgical History:  Procedure Laterality Date  . APPENDECTOMY  2011  . BLEPHAROPLASTY Bilateral revision 02-17-2016  . CARDIAC CATHETERIZATION N/A 04/27/2016   Procedure: Left Heart Cath and Coronary Angiography;  Surgeon: Belva Crome, MD;  Location: [place] [facility];  Service: Cardiovascular;  Laterality: N/A;  acute inferior wall STEMI ;  ostCx to midCx 90%,  ost1st Mrg to 1st Mrg 50%,  mid to distal RCA 100%,  pRCA 80%,  LVEF 45-50% w/ inferobasal akinesis  . CARDIAC CATHETERIZATION N/A 04/27/2016   Procedure: Temporary Pacemaker;  Surgeon: Belva Crome, MD;  Location: [place] [facility];  Service: Cardiovascular;  Laterality: N/A;  mobitz 2  second degree HB w/ heart rate 29bpm  . CARDIAC CATHETERIZATION N/A 04/27/2016   Procedure: Coronary Stent Intervention;  Surgeon: Belva Crome, MD;  Location: [place] [facility];  Service: Cardiovascular;  Laterality: N/A; DES x1 to  Proximal RCA;  DES x1 to Mid RCA  . CATARACT EXTRACTION W/ INTRAOCULAR LENS  IMPLANT, BILATERAL  2016  approx.  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  CYSTOSCOPY  01/19/2012   Procedure: CYSTOSCOPY;  Surgeon: Malka So, MD;  Location: [facility];  Service: Urology;;  no seeds found in bladder  . CYSTOSCOPY W/ RETROGRADES Bilateral 04/19/2016   Procedure: CYSTOSCOPY WITH BILATERAL RETROGRADE PYELOGRAM TRANSURETHRAL RESECTION OF BLADDER TUMOR ;  Surgeon: [name], MD;  [facility];  Service: Urology;  Laterality: Bilateral;  . CYSTOSCOPY WITH BIOPSY N/A 11/10/2016   Procedure: CYSTOSCOPY WITH BIOPSY AND FULGURATION;  Surgeon: [name], MD;  Location: [facility];  Service: Urology;  Laterality: N/A;  . RADIOACTIVE SEED IMPLANT   01/19/2012   Procedure: RADIOACTIVE SEED IMPLANT;  Surgeon: Malka So, MD;  Location: [facility];  Service: Urology;  Laterality: N/A;  68 seeds implanted  . TRANSTHORACIC ECHOCARDIOGRAM  06/02/2016   ef 60-65%/  trivial MR/  mild TR    There were no vitals filed for this visit.      Subjective Assessment - 02/28/17 1310    Subjective Pt arriving to therapy reporting no pain. Pt also reporting no falls. Pt reporting he feels like his balance has improved.    Limitations Walking   Patient Stated Goals I want to improve my balance.   Currently in Pain? No/denies            [facility] PT Assessment - 02/28/17 0001      Assessment   Medical Diagnosis Gait disturbance; Imbalance.   Referring Provider Darryl Nestle MD     Precautions   Precautions Fall     Restrictions   Weight Bearing Restrictions No     Balance Screen   Has the patient fallen in the past 6 months Yes     Berg Balance Test   Sit to Stand Able to stand without using hands and stabilize independently   Standing Unsupported Able to stand safely 2 minutes   Sitting with Back Unsupported but Feet Supported on Floor or Stool Able to sit safely and securely 2 minutes   Stand to Sit Sits safely with minimal use of hands   Transfers Able to transfer safely, minor use of hands   Standing Unsupported with Eyes Closed Able to stand 10 seconds safely   Standing Ubsupported with Feet Together Able to place feet together independently and stand 1 minute safely   From Standing, Reach Forward with Outstretched Arm Can reach confidently >25 cm (10")   From Standing Position, Pick up Object from Floor Able to pick up shoe safely and easily   From Standing Position, Turn to Look Behind Over each Shoulder Looks behind from both sides and weight shifts well   Turn 360 Degrees Able to turn 360 degrees safely but slowly   Standing Unsupported, Alternately Place Feet on Step/Stool Able to complete 4 steps without aid  or supervision   Standing Unsupported, One Foot in Front Needs help to step but can hold 15 seconds   Standing on One Leg Unable to try or needs assist to prevent fall   Total Score 45                     [facility] Adult PT Treatment/Exercise - 02/28/17 0001      Ambulation/Gait   Gait Comments Gait with walking stick instructions for heel to toe gait pattern to improve bilateral foot clearance     Exercises   Exercises Knee/Hip     Lumbar Exercises: Aerobic   Stationary Bike Nustep level 6 x 17 minutes.  Lumbar Exercises: Machines for Strengthening   Leg Press 2 1/2 plates x 5 minutes.     Knee/Hip Exercises: Machines for Strengthening   Total Gym Leg Press 3 plates seat 8, 10 reps with R LE                PT Education - 03/10/2017 1311    Education provided Yes   Education Details Gait training with heel to toe gait pattern   Person(s) Educated Patient   Methods Explanation   Comprehension Verbalized understanding;Returned demonstration          PT Short Term Goals - 01/30/17 1708      PT SHORT TERM GOAL #1   Time 2   Period Weeks   Status New           PT Long Term Goals - 03/10/17 1315      PT LONG TERM GOAL #1   Title Berg score= 49-50/56.   Baseline March 10, 2017 BERG = 45   Time 4   Period Weeks   Status Partially Met     PT LONG TERM GOAL #2   Title Independent with a HEP.   Time 4   Period Weeks   Status New               Plan - 03-10-2017 1351    Clinical Impression Statement Patient is making great progress with physical therapy. Pt has improved his BERG balance score from 40/56 to 45/56. Pt also has improved his R LE strength. Pt is amb with a walking stick, working on his heel to toe gait pattern to improve his bilateral foot clearance. Pt could benefit from 8 additional PT visits to continue pt's progression toward improved balance, gait and LE strength.    Clinical Presentation Stable   Rehab Potential Good   PT  Frequency 2x / week   PT Duration 4 weeks   PT Treatment/Interventions ADLs/Self Care Home Management;Functional mobility training;Therapeutic activities;Therapeutic exercise;Balance training;Neuromuscular re-education   PT Next Visit Plan cont with dynamic Balance training per MPT may consider right LE strengthening   PT Home Exercise Plan Issued handout for hip flexion, hip abduction, heel raises and hip extension.   Consulted and Agree with Plan of Care Patient      Patient will benefit from skilled therapeutic intervention in order to improve the following deficits and impairments:  Decreased activity tolerance, Abnormal gait, Decreased strength, Decreased balance  Visit Diagnosis: Unsteadiness on feet  Muscle weakness (generalized)       G-Codes - 03-10-17 1403    Functional Assessment Tool Used (Outpatient Only) Clinical judgement.   Functional Limitation Mobility: Walking and moving around   Mobility: Walking and Moving Around Current Status [phone]) At least 40 percent but less than 60 percent impaired, limited or restricted   Mobility: Walking and Moving Around Goal Status [phone]) At least 40 percent but less than 60 percent impaired, limited or restricted      Problem List Patient Active Problem List   Diagnosis Date Noted  . Aortic atherosclerosis ([place]) 06/03/2016  . General weakness   . Protein-calorie malnutrition, severe ([place])   . Bradycardia 06/02/2016  . GI bleed 06/01/2016  . 1st degree AV block   . AV block, Mobitz 2 04/27/2016  . Old MI (myocardial infarction) 04/27/2016  . Bladder cancer ([place]) 04/18/2016  . Vitamin D deficiency 10/15/2015  . Prostate cancer ([place])   . Hypercholesterolemia   . Essential hypertension   . Seizures ([place])   .  GERD 08/22/2008  . COLONIC POLYPS, ADENOMATOUS, HX OF 08/22/2008    Oretha Caprice, MPT 02/28/2017, 2:06 PM  [facility] [place], [place],  [address] Phone: [phone]   Fax:  [phone]  Name: Joshua [name] MRN: [id number] Date of Birth: [birth year]

## 2017-02-28 NOTE — Patient Instructions (Signed)
   Holding onto kitchen sink  Lift knee to upward Repeat 15 times on each leg alternating 1-2 times a day     Repeat 15 times   Repeat 15 times on each leg     Repeat 15 times

## 2017-03-02 ENCOUNTER — Encounter: Payer: Self-pay | Admitting: Physical Therapy

## 2017-03-02 ENCOUNTER — Ambulatory Visit: Payer: Medicare Other | Admitting: Physical Therapy

## 2017-03-02 DIAGNOSIS — R2681 Unsteadiness on feet: Secondary | ICD-10-CM

## 2017-03-02 DIAGNOSIS — M6281 Muscle weakness (generalized): Secondary | ICD-10-CM | POA: Diagnosis not present

## 2017-03-02 NOTE — Therapy (Signed)
[facility] [place], [place], [address] Phone: [phone]   Fax:  [phone]  Physical Therapy Treatment  Patient Details  Name: Joshua [name] MRN: [id number] Date of Birth: 10-18-1931 Referring Provider: Darryl Nestle MD  Encounter Date: 03/02/2017      PT End of Session - 03/02/17 1303    Visit Number 10   Number of Visits 16   Date for PT Re-Evaluation 04/29/17   PT Start Time 1301   PT Stop Time 1344   PT Time Calculation (min) 43 min   Activity Tolerance Patient tolerated treatment well   Behavior During Therapy [facility] for tasks assessed/performed      Past Medical History:  Diagnosis Date  . 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 Arthritis   . Bilateral lower extremity edema   . Bladder tumor   . CAD (coronary artery disease) cardiologist--  dr Daneen Schick   a. 04/27/2016 PCI with DES to RCA with 50% ostial to 60% segmental mid to distal left main, and 90% thrombus filled ostial to proximal circumflex, with distal right coronary filled by collaterals from left-to-right  . COPD with emphysema ([place])   . Dyspnea on exertion   . ED (erectile dysfunction)   . First degree AV block   . GERD (gastroesophageal reflux disease)   . History of adenomatous polyp of colon    09-22-2008  . History of bladder cancer    s/p  resection bladder tumor 04-19-2016  non-invasive low-grade urothelial carcinoma  . History of prostate cancer urologist-  dr Jeffie Pollock-- last PSA 0.02 (summer 2017)   dx 02/ 2013-- Stage T2a, Gleason 7, PSA 1.58--  s/p  radioactive prostate seed implants 01-19-2012  . History of ST elevation myocardial infarction (STEMI)    04-27-2016  . Hyperlipidemia   . Hypertension   . S/P drug eluting coronary stent placement    04-27-2016   . Second degree Mobitz I AV block    occasional w/ first degree heart block  per cardiologist note by dr Daneen Schick   . Seizures ([place]) per pt son -- no seizure's since 1980   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  arteriorvenous cerebral malformation s/p  craniotomy 1980  . Vitamin D deficiency     Past Surgical History:  Procedure Laterality Date  . APPENDECTOMY  2011  . BLEPHAROPLASTY Bilateral revision 02-17-2016  . CARDIAC CATHETERIZATION N/A 04/27/2016   Procedure: Left Heart Cath and Coronary Angiography;  Surgeon: Belva Crome, MD;  Location: [place] [facility];  Service: Cardiovascular;  Laterality: N/A;  acute inferior wall STEMI ;  ostCx to midCx 90%,  ost1st Mrg to 1st Mrg 50%,  mid to distal RCA 100%,  pRCA 80%,  LVEF 45-50% w/ inferobasal akinesis  . CARDIAC CATHETERIZATION N/A 04/27/2016   Procedure: Temporary Pacemaker;  Surgeon: Belva Crome, MD;  Location: [place] [facility];  Service: Cardiovascular;  Laterality: N/A;  mobitz 2  second degree HB w/ heart rate 29bpm  . CARDIAC CATHETERIZATION N/A 04/27/2016   Procedure: Coronary Stent Intervention;  Surgeon: Belva Crome, MD;  Location: [place] [facility];  Service: Cardiovascular;  Laterality: N/A; DES x1 to  Proximal RCA;  DES x1 to Mid RCA  . CATARACT EXTRACTION W/ INTRAOCULAR LENS  IMPLANT, BILATERAL  2016  approx.  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 CYSTOSCOPY  01/19/2012   Procedure: CYSTOSCOPY;  Surgeon: Malka So, MD;  Location: [facility];  Service: Urology;;  no seeds found in bladder  . CYSTOSCOPY W/ RETROGRADES Bilateral 04/19/2016   Procedure: CYSTOSCOPY WITH BILATERAL RETROGRADE PYELOGRAM TRANSURETHRAL RESECTION OF BLADDER TUMOR ;  Surgeon: [name], MD;  [facility];  Service: Urology;  Laterality: Bilateral;  . CYSTOSCOPY WITH BIOPSY N/A 11/10/2016   Procedure: CYSTOSCOPY WITH BIOPSY AND FULGURATION;  Surgeon: [name], MD;  Location: [facility];  Service: Urology;  Laterality: N/A;  . RADIOACTIVE SEED IMPLANT  01/19/2012    Procedure: RADIOACTIVE SEED IMPLANT;  Surgeon: Malka So, MD;  Location: [facility];  Service: Urology;  Laterality: N/A;  68 seeds implanted  . TRANSTHORACIC ECHOCARDIOGRAM  06/02/2016   ef 60-65%/  trivial MR/  mild TR    There were no vitals filed for this visit.      Subjective Assessment - 03/02/17 1302    Subjective Reports that he hope s his balance is improving.   Limitations Walking   Patient Stated Goals I want to improve my balance.   Currently in Pain? No/denies            [facility] PT Assessment - 03/02/17 0001      Assessment   Medical Diagnosis Gait disturbance; Imbalance.     Precautions   Precautions Fall     Restrictions   Weight Bearing Restrictions No                     [facility] Adult PT Treatment/Exercise - 03/02/17 0001      Lumbar Exercises: Aerobic   Stationary Bike Nustep level 6 x 17 minutes.     Knee/Hip Exercises: Machines for Strengthening   Cybex Knee Extension 20-30# 3x10 reps RLE ecctentric lowering   Cybex Knee Flexion 30# 3x10 reps   Cybex Leg Press 3 pl, seat 8, 3x10 reps             Balance Exercises - 03/02/17 1345      Balance Exercises: Standing   Tandem Stance Eyes open;Intermittent upper extremity support  x2 min   Standing, One Foot on a Step Eyes open;6 inch  x3 min alternating   Step Ups Forward;6 inch;Intermittent UE support  x15 reps each   Other Standing Exercises B toe taps to 6"step x2 min             PT Short Term Goals - 01/30/17 1708      PT SHORT TERM GOAL #1   Time 2   Period Weeks   Status New           PT Long Term Goals - 02/28/17 1315      PT LONG TERM GOAL #1   Title Berg score= 49-50/56.   Baseline 02/28/17 BERG = 45   Time 4   Period Weeks   Status Partially Met     PT LONG TERM GOAL #2   Title Independent with a HEP.   Time 4   Period Weeks   Status New               Plan - 03/02/17 1355    Clinical Impression Statement  Patient tolerated today's treatment with greatest observation throughout treatment being the weakness of RLE limiting balance. Patient able to do well with machine strengthening with eccentric control with RLE the focus. All balance activities based on deficits from BERG test previously with  patient struggling secondary to lack of control and SLS ability on RLE. Although patient's gait a short distance without his staff was more erect and stable than observed in clinic previously.   Rehab Potential Good   PT Frequency 2x / week   PT Duration 4 weeks   PT Treatment/Interventions ADLs/Self Care Home Management;Functional mobility training;Therapeutic activities;Therapeutic exercise;Balance training;Neuromuscular re-education   PT Next Visit Plan cont with dynamic Balance training per MPT may consider right LE strengthening   PT Home Exercise Plan Issued handout for hip flexion, hip abduction, heel raises and hip extension.   Consulted and Agree with Plan of Care Patient      Patient will benefit from skilled therapeutic intervention in order to improve the following deficits and impairments:  Decreased activity tolerance, Abnormal gait, Decreased strength, Decreased balance  Visit Diagnosis: Unsteadiness on feet  Muscle weakness (generalized)     Problem List Patient Active Problem List   Diagnosis Date Noted  . Aortic atherosclerosis ([place]) 06/03/2016  . General weakness   . Protein-calorie malnutrition, severe ([place])   . Bradycardia 06/02/2016  . GI bleed 06/01/2016  . 1st degree AV block   . AV block, Mobitz 2 04/27/2016  . Old MI (myocardial infarction) 04/27/2016  . Bladder cancer ([place]) 04/18/2016  . Vitamin D deficiency 10/15/2015  . Prostate cancer ([place])   . Hypercholesterolemia   . Essential hypertension   . Seizures ([place])   . GERD 08/22/2008  . COLONIC POLYPS, ADENOMATOUS, HX OF 08/22/2008    Wynelle Fanny, PTA 03/02/2017, 1:58 PM  [facility] [place], [place], [address] Phone: [phone]   Fax:  [phone]  Name: BLESS BELSHE MRN: [id number] Date of Birth: 08-20-1932

## 2017-03-07 ENCOUNTER — Encounter: Payer: Self-pay | Admitting: Physical Therapy

## 2017-03-07 ENCOUNTER — Ambulatory Visit: Payer: Medicare Other | Admitting: Physical Therapy

## 2017-03-07 DIAGNOSIS — M6281 Muscle weakness (generalized): Secondary | ICD-10-CM | POA: Diagnosis not present

## 2017-03-07 DIAGNOSIS — R2681 Unsteadiness on feet: Secondary | ICD-10-CM | POA: Diagnosis not present

## 2017-03-07 NOTE — Therapy (Signed)
[facility] [place], [place], [address] Phone: [phone]   Fax:  [phone]  Physical Therapy Treatment  Patient Details  Name: Joshua [name] MRN: [id number] Date of Birth: 26-Feb-1932 Referring Provider: Darryl Nestle MD  Encounter Date: 03/07/2017      PT End of Session - 03/07/17 1353    Visit Number 11   Number of Visits 16   Date for PT Re-Evaluation 04/29/17   PT Start Time 1300   PT Stop Time 1348   PT Time Calculation (min) 48 min   Equipment Utilized During Treatment Gait belt   Activity Tolerance Patient tolerated treatment well   Behavior During Therapy [facility] for tasks assessed/performed      Past Medical History:  Diagnosis Date  . 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 Arthritis   . Bilateral lower extremity edema   . Bladder tumor   . CAD (coronary artery disease) cardiologist--  dr Daneen Schick   a. 04/27/2016 PCI with DES to RCA with 50% ostial to 60% segmental mid to distal left main, and 90% thrombus filled ostial to proximal circumflex, with distal right coronary filled by collaterals from left-to-right  . COPD with emphysema ([place])   . Dyspnea on exertion   . ED (erectile dysfunction)   . First degree AV block   . GERD (gastroesophageal reflux disease)   . History of adenomatous polyp of colon    09-22-2008  . History of bladder cancer    s/p  resection bladder tumor 04-19-2016  non-invasive low-grade urothelial carcinoma  . History of prostate cancer urologist-  dr Jeffie Pollock-- last PSA 0.02 (summer 2017)   dx 02/ 2013-- Stage T2a, Gleason 7, PSA 1.58--  s/p  radioactive prostate seed implants 01-19-2012  . History of ST elevation myocardial infarction (STEMI)    04-27-2016  . Hyperlipidemia   . Hypertension   . S/P drug eluting coronary stent placement    04-27-2016   . Second degree Mobitz I AV block    occasional w/ first degree heart  block  per cardiologist note by dr Daneen Schick  . Seizures ([place]) per pt son -- no seizure's since 1980   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  arteriorvenous cerebral malformation s/p  craniotomy 1980  . Vitamin D deficiency     Past Surgical History:  Procedure Laterality Date  . APPENDECTOMY  2011  . BLEPHAROPLASTY Bilateral revision 02-17-2016  . CARDIAC CATHETERIZATION N/A 04/27/2016   Procedure: Left Heart Cath and Coronary Angiography;  Surgeon: Belva Crome, MD;  Location: [place] [facility];  Service: Cardiovascular;  Laterality: N/A;  acute inferior wall STEMI ;  ostCx to midCx 90%,  ost1st Mrg to 1st Mrg 50%,  mid to distal RCA 100%,  pRCA 80%,  LVEF 45-50% w/ inferobasal akinesis  . CARDIAC CATHETERIZATION N/A 04/27/2016   Procedure: Temporary Pacemaker;  Surgeon: Belva Crome, MD;  Location: [place] [facility];  Service: Cardiovascular;  Laterality: N/A;  mobitz 2  second degree HB w/ heart rate 29bpm  . CARDIAC CATHETERIZATION N/A 04/27/2016   Procedure: Coronary Stent Intervention;  Surgeon: Belva Crome, MD;  Location: [place] [facility];  Service: Cardiovascular;  Laterality: N/A; DES x1 to  Proximal RCA;  DES x1 to Mid RCA  . CATARACT EXTRACTION W/ INTRAOCULAR LENS  IMPLANT, BILATERAL  2016  approx.  Marland Kitchen CRANIOTOMY  1980   intracranial bleed  from arteriorvenous malformation (left parietal)  . CYSTOSCOPY  01/19/2012   Procedure: CYSTOSCOPY;  Surgeon: Malka So, MD;  Location: [facility];  Service: Urology;;  no seeds found in bladder  . CYSTOSCOPY W/ RETROGRADES Bilateral 04/19/2016   Procedure: CYSTOSCOPY WITH BILATERAL RETROGRADE PYELOGRAM TRANSURETHRAL RESECTION OF BLADDER TUMOR ;  Surgeon: [name], MD;  [facility];  Service: Urology;  Laterality: Bilateral;  . CYSTOSCOPY WITH BIOPSY N/A 11/10/2016   Procedure: CYSTOSCOPY WITH BIOPSY AND FULGURATION;  Surgeon: [name], MD;  Location: [facility];  Service: Urology;   Laterality: N/A;  . RADIOACTIVE SEED IMPLANT  01/19/2012   Procedure: RADIOACTIVE SEED IMPLANT;  Surgeon: Malka So, MD;  Location: [facility];  Service: Urology;  Laterality: N/A;  68 seeds implanted  . TRANSTHORACIC ECHOCARDIOGRAM  06/02/2016   ef 60-65%/  trivial MR/  mild TR    There were no vitals filed for this visit.      Subjective Assessment - 03/07/17 1352    Subjective Pt reporting no pain and no falls. Pt reporting he feels he is getting stronger, but still needs to work on balance and R LE strengthening.    Limitations Walking   Patient Stated Goals I want to improve my balance.   Currently in Pain? No/denies   Multiple Pain Sites No            [facility] PT Assessment - 03/07/17 0001      Assessment   Medical Diagnosis Gait disturbance; Imbalance.     Precautions   Precautions Fall     Restrictions   Weight Bearing Restrictions No                     [facility] Adult PT Treatment/Exercise - 03/07/17 0001      Exercises   Exercises Knee/Hip     Lumbar Exercises: Aerobic   Stationary Bike Nustep level 6 x 16 minutes.     Lumbar Exercises: Machines for Strengthening   Leg Press 2.5 plates x 3 minutes     Knee/Hip Exercises: Machines for Strengthening   Cybex Knee Extension 20-30# 3x10 reps RLE ecctentric lowering   Cybex Knee Flexion 40 # 3x10 reps   Cybex Leg Press 3 pl, seat 8, 3x10 reps             Balance Exercises - 03/07/17 1323      Balance Exercises: Standing   Step Ups Forward;6 inch;Intermittent UE support  x15 reps each   Other Standing Exercises B toe taps to 6"step x2 min, side stepping, baiding front, braiding back all x 20 feet bilateral directions with hand held assistance.            PT Education - 03/07/17 1353    Education provided No          PT Short Term Goals - 01/30/17 1708      PT SHORT TERM GOAL #1   Time 2   Period Weeks   Status New           PT Long Term Goals -  03/07/17 1355      PT LONG TERM GOAL #1   Title Berg score= 49-50/56.   Baseline 02/28/17 BERG = 45   Time 4   Period Weeks   Status Partially Met     PT LONG TERM GOAL #2   Title Independent with a HEP.   Time 4   Period Weeks  Status On-going               Plan - 03/07/17 1354    Clinical Impression Statement Patient tolerating therapy well today. We added abdominals curls and back extension into pt's exercises as well as began braiding exercises for dynamic balance. Pt requiring bilateral UE support to maintain balance. Continue with skilled PT to progress toward LTG's.    Clinical Presentation Stable   Rehab Potential Good   PT Frequency 2x / week   PT Duration 4 weeks   PT Treatment/Interventions ADLs/Self Care Home Management;Functional mobility training;Therapeutic activities;Therapeutic exercise;Balance training;Neuromuscular re-education   PT Next Visit Plan cont with dynamic Balance training per MPT may consider right LE strengthening   PT Home Exercise Plan Issued handout for hip flexion, hip abduction, heel raises and hip extension.   Consulted and Agree with Plan of Care Patient      Patient will benefit from skilled therapeutic intervention in order to improve the following deficits and impairments:  Decreased activity tolerance, Abnormal gait, Decreased strength, Decreased balance  Visit Diagnosis: Unsteadiness on feet  Muscle weakness (generalized)     Problem List Patient Active Problem List   Diagnosis Date Noted  . Aortic atherosclerosis ([place]) 06/03/2016  . General weakness   . Protein-calorie malnutrition, severe ([place])   . Bradycardia 06/02/2016  . GI bleed 06/01/2016  . 1st degree AV block   . AV block, Mobitz 2 04/27/2016  . Old MI (myocardial infarction) 04/27/2016  . Bladder cancer ([place]) 04/18/2016  . Vitamin D deficiency 10/15/2015  . Prostate cancer ([place])   . Hypercholesterolemia   . Essential hypertension   . Seizures ([place])    . GERD 08/22/2008  . COLONIC POLYPS, ADENOMATOUS, HX OF 08/22/2008    Oretha Caprice, MPT 03/07/2017, 1:57 PM  [facility] [address] [place], [place], [address] Phone: [phone]   Fax:  [phone]  Name: Joshua [name] MRN: [id number] Date of Birth: 1932-08-09

## 2017-03-09 ENCOUNTER — Ambulatory Visit: Payer: Medicare Other | Admitting: *Deleted

## 2017-03-09 DIAGNOSIS — M6281 Muscle weakness (generalized): Secondary | ICD-10-CM

## 2017-03-09 DIAGNOSIS — R2681 Unsteadiness on feet: Secondary | ICD-10-CM | POA: Diagnosis not present

## 2017-03-09 NOTE — Therapy (Signed)
[facility] [place], [place], [address] Phone: [phone]   Fax:  [phone]  Physical Therapy Treatment  Patient Details  Name: Joshua [name] MRN: [id number] Date of Birth: September 11, 1932 Referring Provider: Darryl Nestle MD  Encounter Date: 03/09/2017      PT End of Session - 03/09/17 1327    Visit Number 12   Number of Visits 16   Date for PT Re-Evaluation 04/29/17   PT Start Time 1300   PT Stop Time 1350   PT Time Calculation (min) 50 min      Past Medical History:  Diagnosis Date  . 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 Arthritis   . Bilateral lower extremity edema   . Bladder tumor   . CAD (coronary artery disease) cardiologist--  dr Daneen Schick   a. 04/27/2016 PCI with DES to RCA with 50% ostial to 60% segmental mid to distal left main, and 90% thrombus filled ostial to proximal circumflex, with distal right coronary filled by collaterals from left-to-right  . COPD with emphysema ([place])   . Dyspnea on exertion   . ED (erectile dysfunction)   . First degree AV block   . GERD (gastroesophageal reflux disease)   . History of adenomatous polyp of colon    09-22-2008  . History of bladder cancer    s/p  resection bladder tumor 04-19-2016  non-invasive low-grade urothelial carcinoma  . History of prostate cancer urologist-  dr Jeffie Pollock-- last PSA 0.02 (summer 2017)   dx 02/ 2013-- Stage T2a, Gleason 7, PSA 1.58--  s/p  radioactive prostate seed implants 01-19-2012  . History of ST elevation myocardial infarction (STEMI)    04-27-2016  . Hyperlipidemia   . Hypertension   . S/P drug eluting coronary stent placement    04-27-2016   . Second degree Mobitz I AV block    occasional w/ first degree heart block  per cardiologist note by dr Daneen Schick  . Seizures ([place]) per pt son -- no seizure's since 1980   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   arteriorvenous cerebral malformation s/p  craniotomy 1980  . Vitamin D deficiency     Past Surgical History:  Procedure Laterality Date  . APPENDECTOMY  2011  . BLEPHAROPLASTY Bilateral revision 02-17-2016  . CARDIAC CATHETERIZATION N/A 04/27/2016   Procedure: Left Heart Cath and Coronary Angiography;  Surgeon: Belva Crome, MD;  Location: [place] [facility];  Service: Cardiovascular;  Laterality: N/A;  acute inferior wall STEMI ;  ostCx to midCx 90%,  ost1st Mrg to 1st Mrg 50%,  mid to distal RCA 100%,  pRCA 80%,  LVEF 45-50% w/ inferobasal akinesis  . CARDIAC CATHETERIZATION N/A 04/27/2016   Procedure: Temporary Pacemaker;  Surgeon: Belva Crome, MD;  Location: [place] [facility];  Service: Cardiovascular;  Laterality: N/A;  mobitz 2  second degree HB w/ heart rate 29bpm  . CARDIAC CATHETERIZATION N/A 04/27/2016   Procedure: Coronary Stent Intervention;  Surgeon: Belva Crome, MD;  Location: [place] [facility];  Service: Cardiovascular;  Laterality: N/A; DES x1 to  Proximal RCA;  DES x1 to Mid RCA  . CATARACT EXTRACTION W/ INTRAOCULAR LENS  IMPLANT, BILATERAL  2016  approx.  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 CYSTOSCOPY  01/19/2012   Procedure: CYSTOSCOPY;  Surgeon: Malka So, MD;  Location: [facility];  Service: Urology;;  no seeds found in bladder  . CYSTOSCOPY W/ RETROGRADES Bilateral 04/19/2016   Procedure: CYSTOSCOPY WITH BILATERAL RETROGRADE PYELOGRAM TRANSURETHRAL RESECTION OF BLADDER TUMOR ;  Surgeon: [name], MD;  [facility];  Service: Urology;  Laterality: Bilateral;  . CYSTOSCOPY WITH BIOPSY N/A 11/10/2016   Procedure: CYSTOSCOPY WITH BIOPSY AND FULGURATION;  Surgeon: [name], MD;  Location: [facility];  Service: Urology;  Laterality: N/A;  . RADIOACTIVE SEED IMPLANT  01/19/2012   Procedure: RADIOACTIVE SEED IMPLANT;  Surgeon: Malka So, MD;  Location: [facility];   Service: Urology;  Laterality: N/A;  68 seeds implanted  . TRANSTHORACIC ECHOCARDIOGRAM  06/02/2016   ef 60-65%/  trivial MR/  mild TR    There were no vitals filed for this visit.      Subjective Assessment - 03/09/17 1325    Subjective Pt reporting no pain and no falls. Pt reporting he feels he is getting stronger, but still needs to work on balance and R LE strengthening.    Limitations Walking   Patient Stated Goals I want to improve my balance.   Currently in Pain? No/denies                         [facility] Adult PT Treatment/Exercise - 03/09/17 0001      Exercises   Exercises Knee/Hip     Lumbar Exercises: Aerobic   Stationary Bike Nustep level 6 x 36minutes. monitored for tolerance     Lumbar Exercises: Machines for Strengthening   Leg Press 2.5 plates x 3 minutes     Knee/Hip Exercises: Machines for Strengthening   Cybex Knee Extension 20-30# 3x10 reps RLE ecctentric lowering   Cybex Knee Flexion 40 # 3x10 reps   Cybex Leg Press 3 pl, seat 8, 3x10 reps             Balance Exercises - 03/09/17 1338      Balance Exercises: Standing   Standing Eyes Opened Narrow base of support (BOS);Foam/compliant surface  UE reaches 3 x 20   Standing Eyes Closed Wide (BOA);Solid surface;1 rep   Standing, One Foot on a Step Eyes open;6 inch  x3 min alternating   Stepping Strategy Lateral;10 reps  each side             PT Short Term Goals - 03/09/17 1344      PT SHORT TERM GOAL #1   Title Berg score= 46/56.   Period Weeks   Status On-going           PT Long Term Goals - 03/09/17 1345      PT LONG TERM GOAL #2   Title Independent with a HEP.   Period Weeks   Status On-going               Plan - 03/09/17 1327    Clinical Impression Statement Pt arrives today feeling fairly well and was able to complete balance and strengthening Act.'s He was able to perform better on balance Act's , but unable to meet STG or LTG yet due to balance  deficit.   Clinical Presentation Stable   Rehab Potential Good   PT Frequency 2x / week   PT Duration 4 weeks   PT Treatment/Interventions ADLs/Self Care Home Management;Functional mobility training;Therapeutic activities;Therapeutic exercise;Balance training;Neuromuscular re-education   PT Next Visit Plan cont with dynamic Balance training per MPT may consider right LE strengthening  BERG next RX   Consulted and Agree with Plan of Care Patient      Patient will benefit from skilled therapeutic intervention in order to improve the following deficits and impairments:  Decreased activity tolerance, Abnormal gait, Decreased strength, Decreased balance  Visit Diagnosis: Unsteadiness on feet  Muscle weakness (generalized)     Problem List Patient Active Problem List   Diagnosis Date Noted  . Aortic atherosclerosis ([place]) 06/03/2016  . General weakness   . Protein-calorie malnutrition, severe ([place])   . Bradycardia 06/02/2016  . GI bleed 06/01/2016  . 1st degree AV block   . AV block, Mobitz 2 04/27/2016  . Old MI (myocardial infarction) 04/27/2016  . Bladder cancer ([place]) 04/18/2016  . Vitamin D deficiency 10/15/2015  . Prostate cancer ([place])   . Hypercholesterolemia   . Essential hypertension   . Seizures ([place])   . GERD 08/22/2008  . COLONIC POLYPS, ADENOMATOUS, HX OF 08/22/2008    RAMSEUR,CHRIS,PTA 03/09/2017, 1:50 PM  [facility] [place], [place], [address] Phone: [phone]   Fax:  [phone]  Name: DEMETRICK EICHENBERGER MRN: [id number] Date of Birth: 01/21/1932

## 2017-03-14 ENCOUNTER — Ambulatory Visit: Payer: Medicare Other | Admitting: Physical Therapy

## 2017-03-14 ENCOUNTER — Encounter: Payer: Self-pay | Admitting: Physical Therapy

## 2017-03-14 DIAGNOSIS — R2681 Unsteadiness on feet: Secondary | ICD-10-CM

## 2017-03-14 DIAGNOSIS — M6281 Muscle weakness (generalized): Secondary | ICD-10-CM

## 2017-03-14 NOTE — Therapy (Signed)
[facility] [place], [place], [address] Phone: [phone]   Fax:  [phone]  Physical Therapy Treatment  Patient Details  Name: Joshua [name] MRN: [id number] Date of Birth: 1932-07-23 Referring Provider: Darryl Nestle MD  Encounter Date: 03/14/2017      PT End of Session - 03/14/17 1314    Visit Number 13   Number of Visits 16   Date for PT Re-Evaluation 04/29/17   PT Start Time 7915   PT Stop Time 1350   PT Time Calculation (min) 45 min   Equipment Utilized During Treatment Gait belt   Activity Tolerance Patient tolerated treatment well   Behavior During Therapy [facility] for tasks assessed/performed      Past Medical History:  Diagnosis Date  . 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 Arthritis   . Bilateral lower extremity edema   . Bladder tumor   . CAD (coronary artery disease) cardiologist--  dr Daneen Schick   a. 04/27/2016 PCI with DES to RCA with 50% ostial to 60% segmental mid to distal left main, and 90% thrombus filled ostial to proximal circumflex, with distal right coronary filled by collaterals from left-to-right  . COPD with emphysema ([place])   . Dyspnea on exertion   . ED (erectile dysfunction)   . First degree AV block   . GERD (gastroesophageal reflux disease)   . History of adenomatous polyp of colon    09-22-2008  . History of bladder cancer    s/p  resection bladder tumor 04-19-2016  non-invasive low-grade urothelial carcinoma  . History of prostate cancer urologist-  dr Jeffie Pollock-- last PSA 0.02 (summer 2017)   dx 02/ 2013-- Stage T2a, Gleason 7, PSA 1.58--  s/p  radioactive prostate seed implants 01-19-2012  . History of ST elevation myocardial infarction (STEMI)    04-27-2016  . Hyperlipidemia   . Hypertension   . S/P drug eluting coronary stent placement    04-27-2016   . Second degree Mobitz I AV block    occasional w/ first degree heart  block  per cardiologist note by dr Daneen Schick  . Seizures ([place]) per pt son -- no seizure's since 1980   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  arteriorvenous cerebral malformation s/p  craniotomy 1980  . Vitamin D deficiency     Past Surgical History:  Procedure Laterality Date  . APPENDECTOMY  2011  . BLEPHAROPLASTY Bilateral revision 02-17-2016  . CARDIAC CATHETERIZATION N/A 04/27/2016   Procedure: Left Heart Cath and Coronary Angiography;  Surgeon: Belva Crome, MD;  Location: [place] [facility];  Service: Cardiovascular;  Laterality: N/A;  acute inferior wall STEMI ;  ostCx to midCx 90%,  ost1st Mrg to 1st Mrg 50%,  mid to distal RCA 100%,  pRCA 80%,  LVEF 45-50% w/ inferobasal akinesis  . CARDIAC CATHETERIZATION N/A 04/27/2016   Procedure: Temporary Pacemaker;  Surgeon: Belva Crome, MD;  Location: [place] [facility];  Service: Cardiovascular;  Laterality: N/A;  mobitz 2  second degree HB w/ heart rate 29bpm  . CARDIAC CATHETERIZATION N/A 04/27/2016   Procedure: Coronary Stent Intervention;  Surgeon: Belva Crome, MD;  Location: [place] [facility];  Service: Cardiovascular;  Laterality: N/A; DES x1 to  Proximal RCA;  DES x1 to Mid RCA  . CATARACT EXTRACTION W/ INTRAOCULAR LENS  IMPLANT, BILATERAL  2016  approx.  Marland Kitchen CRANIOTOMY  1980   intracranial bleed  from arteriorvenous malformation (left parietal)  . CYSTOSCOPY  01/19/2012   Procedure: CYSTOSCOPY;  Surgeon: Malka So, MD;  Location: [facility];  Service: Urology;;  no seeds found in bladder  . CYSTOSCOPY W/ RETROGRADES Bilateral 04/19/2016   Procedure: CYSTOSCOPY WITH BILATERAL RETROGRADE PYELOGRAM TRANSURETHRAL RESECTION OF BLADDER TUMOR ;  Surgeon: [name], MD;  Location: [facility];  Service: Urology;  Laterality: Bilateral;  . CYSTOSCOPY WITH BIOPSY N/A 11/10/2016   Procedure: CYSTOSCOPY WITH BIOPSY AND FULGURATION;  Surgeon: [name], MD;  Location: [facility];  Service: Urology;   Laterality: N/A;  . RADIOACTIVE SEED IMPLANT  01/19/2012   Procedure: RADIOACTIVE SEED IMPLANT;  Surgeon: Malka So, MD;  Location[facility];  Service: Urology;  Laterality: N/A;  68 seeds implanted  . TRANSTHORACIC ECHOCARDIOGRAM  06/02/2016   ef 60-65%/  trivial MR/  mild TR    There were no vitals filed for this visit.      Subjective Assessment - 03/14/17 1313    Subjective Pt reporting no pain today upon arrival. No falls since last visit.    Limitations Walking   Patient Stated Goals I want to improve my balance.   Currently in Pain? No/denies            [facility] PT Assessment - 03/14/17 0001      Assessment   Medical Diagnosis Gait disturbance; Imbalance.     Precautions   Precautions Fall     Restrictions   Weight Bearing Restrictions No                     [facility] Adult PT Treatment/Exercise - 03/14/17 0001      Ambulation/Gait   Ambulation/Gait Yes   Ambulation/Gait Assistance 5: Supervision   Ambulation/Gait Assistance Details Pt wearing a gait belt with close supervision over uneven sidewalk surfaces and grass surfaces   Ambulation Distance (Feet) 600 Feet   Assistive device --  walking stick   Gait Pattern Step-through pattern;Poor foot clearance - left;Poor foot clearance - right   Gait Comments Up and down over a curb step with close supervision , pt using a walking stick     Exercises   Exercises Knee/Hip     Lumbar Exercises: Aerobic   Stationary Bike Nustep level 6 x 6mnutes. monitored for tolerance     Lumbar Exercises: Machines for Strengthening   Leg Press 2.5 plates x 3 minutes     Knee/Hip Exercises: Machines for Strengthening   Cybex Knee Extension 20-30# 3x10 reps RLE ecctentric lowering   Cybex Knee Flexion 40 # 3x10 reps   Cybex Leg Press 3 pl, seat 8, 3x10 reps                PT Education - 03/14/17 1313    Education provided Yes   Education Details Gait training on uneven surfaes with  walking stick working on hele to toe gait pattern   Person(s) Educated Patient   Methods Explanation;Demonstration;Verbal cues   Comprehension Verbalized understanding;Returned demonstration          PT Short Term Goals - 03/09/17 1344      PT SHORT TERM GOAL #1   Title Berg score= 46/56.   Period Weeks   Status On-going           PT Long Term Goals - 03/14/17 1315      PT LONG TERM GOAL #1   Title Berg score= 49-50/56.   Baseline  02/28/17 BERG = 45   Period Weeks   Status Partially Met     PT LONG TERM GOAL #2   Title Independent with a HEP.   Period Weeks   Status On-going               Plan - 03/14/17 1315    Clinical Impression Statement Patient tolerating treatment well today. Progressing with amb and balance exercises. Continue with skilled PT toward goals set.    Rehab Potential Good   PT Frequency 2x / week   PT Duration 4 weeks   PT Treatment/Interventions ADLs/Self Care Home Management;Functional mobility training;Therapeutic activities;Therapeutic exercise;Balance training;Neuromuscular re-education   PT Next Visit Plan cont with dynamic Balance training per MPT may consider right LE strengthening    BERG next RX   PT Home Exercise Plan Issued handout for hip flexion, hip abduction, heel raises and hip extension.   Consulted and Agree with Plan of Care Patient      Patient will benefit from skilled therapeutic intervention in order to improve the following deficits and impairments:  Decreased activity tolerance, Abnormal gait, Decreased strength, Decreased balance  Visit Diagnosis: Unsteadiness on feet  Muscle weakness (generalized)     Problem List Patient Active Problem List   Diagnosis Date Noted  . Aortic atherosclerosis ([place]) 06/03/2016  . General weakness   . Protein-calorie malnutrition, severe ([place])   . Bradycardia 06/02/2016  . GI bleed 06/01/2016  . 1st degree AV block   . AV block, Mobitz 2 04/27/2016  . Old MI  (myocardial infarction) 04/27/2016  . Bladder cancer ([place]) 04/18/2016  . Vitamin D deficiency 10/15/2015  . Prostate cancer ([place])   . Hypercholesterolemia   . Essential hypertension   . Seizures ([place])   . GERD 08/22/2008  . COLONIC POLYPS, ADENOMATOUS, HX OF 08/22/2008    Oretha Caprice, MPT 03/14/2017, 1:21 PM  [facility] [address] [place], [place], [address] Phone: [phone]   Fax:  [phone]  Name: Joshua [name] MRN: [id number] Date of Birth: 30-Dec-1931

## 2017-03-16 ENCOUNTER — Ambulatory Visit: Payer: Medicare Other | Admitting: *Deleted

## 2017-03-16 DIAGNOSIS — R2681 Unsteadiness on feet: Secondary | ICD-10-CM | POA: Diagnosis not present

## 2017-03-16 DIAGNOSIS — M6281 Muscle weakness (generalized): Secondary | ICD-10-CM

## 2017-03-16 DIAGNOSIS — L570 Actinic keratosis: Secondary | ICD-10-CM | POA: Diagnosis not present

## 2017-03-16 DIAGNOSIS — L820 Inflamed seborrheic keratosis: Secondary | ICD-10-CM | POA: Diagnosis not present

## 2017-03-16 DIAGNOSIS — D485 Neoplasm of uncertain behavior of skin: Secondary | ICD-10-CM | POA: Diagnosis not present

## 2017-03-16 NOTE — Therapy (Signed)
[facility] [place], [place], [address] Phone: [phone]   Fax:  [phone]  Physical Therapy Treatment  Patient Details  Name: Joshua [name] MRN: [id number] Date of Birth: [birth year] Referring Provider: Darryl Nestle MD  Encounter Date: 03/16/2017      PT End of Session - 03/16/17 1326    Visit Number 14   Number of Visits 16   Date for PT Re-Evaluation 04/29/17   PT Start Time 1301   PT Stop Time 1337  Pt had to leave early   PT Time Calculation (min) 36 min      Past Medical History:  Diagnosis Date  . 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 Arthritis   . Bilateral lower extremity edema   . Bladder tumor   . CAD (coronary artery disease) cardiologist--  dr Daneen Schick   a. 04/27/2016 PCI with DES to RCA with 50% ostial to 60% segmental mid to distal left main, and 90% thrombus filled ostial to proximal circumflex, with distal right coronary filled by collaterals from left-to-right  . COPD with emphysema ([place])   . Dyspnea on exertion   . ED (erectile dysfunction)   . First degree AV block   . GERD (gastroesophageal reflux disease)   . History of adenomatous polyp of colon    09-22-2008  . History of bladder cancer    s/p  resection bladder tumor 04-19-2016  non-invasive low-grade urothelial carcinoma  . History of prostate cancer urologist-  dr Jeffie Pollock-- last PSA 0.02 (summer 2017)   dx 02/ 2013-- Stage T2a, Gleason 7, PSA 1.58--  s/p  radioactive prostate seed implants 01-19-2012  . History of ST elevation myocardial infarction (STEMI)    04-27-2016  . Hyperlipidemia   . Hypertension   . S/P drug eluting coronary stent placement    04-27-2016   . Second degree Mobitz I AV block    occasional w/ first degree heart block  per cardiologist note by dr Daneen Schick  . Seizures ([place]) per pt son -- no seizure's since 1980   1980 seizure caused by  intracranial bleed due to  arteriorvenous cerebral malformation s/p  craniotomy 1980  . Vitamin D deficiency     Past Surgical History:  Procedure Laterality Date  . APPENDECTOMY  2011  . BLEPHAROPLASTY Bilateral revision 02-17-2016  . CARDIAC CATHETERIZATION N/A 04/27/2016   Procedure: Left Heart Cath and Coronary Angiography;  Surgeon: Belva Crome, MD;  Location: [place] [facility];  Service: Cardiovascular;  Laterality: N/A;  acute inferior wall STEMI ;  ostCx to midCx 90%,  ost1st Mrg to 1st Mrg 50%,  mid to distal RCA 100%,  pRCA 80%,  LVEF 45-50% w/ inferobasal akinesis  . CARDIAC CATHETERIZATION N/A 04/27/2016   Procedure: Temporary Pacemaker;  Surgeon: Belva Crome, MD;  Location: [place] [facility];  Service: Cardiovascular;  Laterality: N/A;  mobitz 2  second degree HB w/ heart rate 29bpm  . CARDIAC CATHETERIZATION N/A 04/27/2016   Procedure: Coronary Stent Intervention;  Surgeon: Belva Crome, MD;  Location: [place] [facility];  Service: Cardiovascular;  Laterality: N/A; DES x1 to  Proximal RCA;  DES x1 to Mid RCA  . CATARACT EXTRACTION W/ INTRAOCULAR LENS  IMPLANT, BILATERAL  2016  approx.  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 CYSTOSCOPY  01/19/2012   Procedure: CYSTOSCOPY;  Surgeon: Malka So,  MD;  Location: [place];  Service: Urology;;  no seeds found in bladder  . CYSTOSCOPY W/ RETROGRADES Bilateral 04/19/2016   Procedure: CYSTOSCOPY WITH BILATERAL RETROGRADE PYELOGRAM TRANSURETHRAL RESECTION OF BLADDER TUMOR ;  Surgeon: [name], MD;  Location: [facility];  Service: Urology;  Laterality: Bilateral;  . CYSTOSCOPY WITH BIOPSY N/A 11/10/2016   Procedure: CYSTOSCOPY WITH BIOPSY AND FULGURATION;  Surgeon: [name], MD;  Location: [facility];  Service: Urology;  Laterality: N/A;  . RADIOACTIVE SEED IMPLANT  01/19/2012   Procedure: RADIOACTIVE SEED IMPLANT;  Surgeon: Malka So, MD;  Location:  [facility];  Service: Urology;  Laterality: N/A;  68 seeds implanted  . TRANSTHORACIC ECHOCARDIOGRAM  06/02/2016   ef 60-65%/  trivial MR/  mild TR    There were no vitals filed for this visit.      Subjective Assessment - 03/16/17 1303    Subjective Pt reporting no pain today upon arrival. No falls since last visit.    Limitations Walking   Patient Stated Goals I want to improve my balance.   Currently in Pain? No/denies            [facility] PT Assessment - 03/16/17 0001      Berg Balance Test   Sit to Stand Able to stand without using hands and stabilize independently   Standing Unsupported Able to stand safely 2 minutes   Sitting with Back Unsupported but Feet Supported on Floor or Stool Able to sit safely and securely 2 minutes   Stand to Sit Sits safely with minimal use of hands   Transfers Able to transfer safely, minor use of hands   Standing Unsupported with Eyes Closed Able to stand 10 seconds safely   Standing Ubsupported with Feet Together Able to place feet together independently and stand 1 minute safely   From Standing, Reach Forward with Outstretched Arm Can reach confidently >25 cm (10")   From Standing Position, Pick up Object from Floor Able to pick up shoe safely and easily   From Standing Position, Turn to Look Behind Over each Shoulder Looks behind from both sides and weight shifts well   Turn 360 Degrees Able to turn 360 degrees safely but slowly   Standing Unsupported, Alternately Place Feet on Step/Stool Able to complete 4 steps without aid or supervision   Standing Unsupported, One Foot in Front Able to take small step independently and hold 30 seconds   Standing on One Leg Able to lift leg independently and hold equal to or more than 3 seconds   Total Score 48                     [facility] Adult PT Treatment/Exercise - 03/16/17 0001      Exercises   Exercises Knee/Hip     Lumbar Exercises: Aerobic   Stationary Bike --      Knee/Hip Exercises: Machines for Strengthening   Cybex Knee Extension 30# 3x15 reps RLE ecctentric lowering, RT LE 10#s x20   Cybex Knee Flexion 50 # 3x15 reps   Cybex Leg Press 3 pl, seat 8, 3x10 reps     Knee/Hip Exercises: Standing   Rocker Board 5 minutes  balance                  PT Short Term Goals - 03/16/17 1312      PT SHORT TERM GOAL #1   Title Berg score= 46/56.   Time 2  Period Weeks   Status Achieved           PT Long Term Goals - 03/16/17 1312      PT LONG TERM GOAL #1   Title Berg score= 49-50/56.   Baseline 03/16/17 BERG = 48   Time 4   Period Weeks   Status Partially Met     PT LONG TERM GOAL #2   Title Independent with a HEP.   Period Weeks   Status On-going               Plan - 03/16/17 1327    Clinical Impression Statement Pt arrived todaydoing fairly well. He was able to perform all Balance act.'s and strengthening exs without pain and mainly fatigue. He was able to meet STG for Berg score and progressing toward LTG. Berg score 48/56 today. Pt had to leave early today due to another appt.   Clinical Presentation Stable   Rehab Potential Good   PT Frequency 2x / week   PT Duration 4 weeks   PT Treatment/Interventions ADLs/Self Care Home Management;Functional mobility training;Therapeutic activities;Therapeutic exercise;Balance training;Neuromuscular re-education   PT Next Visit Plan cont with dynamic Balance training per MPT may consider right LE strengthening    BERG next RX   PT Home Exercise Plan Issued handout for hip flexion, hip abduction, heel raises and hip extension.   Consulted and Agree with Plan of Care Patient      Patient will benefit from skilled therapeutic intervention in order to improve the following deficits and impairments:  Decreased activity tolerance, Abnormal gait, Decreased strength, Decreased balance  Visit Diagnosis: Unsteadiness on feet  Muscle weakness (generalized)     Problem  List Patient Active Problem List   Diagnosis Date Noted  . Aortic atherosclerosis ([place]) 06/03/2016  . General weakness   . Protein-calorie malnutrition, severe ([place])   . Bradycardia 06/02/2016  . GI bleed 06/01/2016  . 1st degree AV block   . AV block, Mobitz 2 04/27/2016  . Old MI (myocardial infarction) 04/27/2016  . Bladder cancer ([place]) 04/18/2016  . Vitamin D deficiency 10/15/2015  . Prostate cancer ([place])   . Hypercholesterolemia   . Essential hypertension   . Seizures ([place])   . GERD 08/22/2008  . COLONIC POLYPS, ADENOMATOUS, HX OF 08/22/2008    Arlanda Shiplett,CHRIS, PTA 03/16/2017, 1:45 PM  [facility] [place], [place], [address] Phone: [phone]   Fax:  [phone]  Name: Joshua [name] MRN: [id number] Date of Birth: 31-May-1932

## 2017-03-17 ENCOUNTER — Telehealth: Payer: Self-pay | Admitting: Family Medicine

## 2017-03-17 NOTE — Telephone Encounter (Signed)
Please advise 

## 2017-03-19 NOTE — Telephone Encounter (Signed)
It is okay to refill both of these prescriptions as requested with refills

## 2017-03-20 MED ORDER — PHENOBARBITAL 64.8 MG PO TABS: 64.8000 mg | ORAL_TABLET | Freq: Two times a day (BID) | ORAL | 3 refills | Status: DC

## 2017-03-20 MED ORDER — CLOPIDOGREL BISULFATE 75 MG PO TABS: 75.0000 mg | ORAL_TABLET | Freq: Every day | ORAL | 3 refills | Status: DC

## 2017-03-20 NOTE — Telephone Encounter (Signed)
Pt aware.

## 2017-03-21 ENCOUNTER — Ambulatory Visit: Payer: Medicare Other | Attending: Orthopedic Surgery | Admitting: *Deleted

## 2017-03-21 DIAGNOSIS — M6281 Muscle weakness (generalized): Secondary | ICD-10-CM | POA: Insufficient documentation

## 2017-03-21 DIAGNOSIS — R2681 Unsteadiness on feet: Secondary | ICD-10-CM

## 2017-03-21 NOTE — Therapy (Signed)
[facility] [place], [place], [address] Phone: [phone]   Fax:  [phone]  Physical Therapy Treatment  Patient Details  Name: Joshua [name] MRN: [id number] Date of Birth: 10-03-1931 Referring Provider: Darryl Nestle MD  Encounter Date: 03/21/2017      PT End of Session - 03/21/17 1318    Visit Number 15   Number of Visits 16   Date for PT Re-Evaluation 04/29/17   PT Start Time 1300   PT Stop Time 1351   PT Time Calculation (min) 51 min   Activity Tolerance Patient tolerated treatment well   Behavior During Therapy [facility] for tasks assessed/performed      Past Medical History:  Diagnosis Date  . 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 Arthritis   . Bilateral lower extremity edema   . Bladder tumor   . CAD (coronary artery disease) cardiologist--  dr Daneen Schick   a. 04/27/2016 PCI with DES to RCA with 50% ostial to 60% segmental mid to distal left main, and 90% thrombus filled ostial to proximal circumflex, with distal right coronary filled by collaterals from left-to-right  . COPD with emphysema ([place])   . Dyspnea on exertion   . ED (erectile dysfunction)   . First degree AV block   . GERD (gastroesophageal reflux disease)   . History of adenomatous polyp of colon    09-22-2008  . History of bladder cancer    s/p  resection bladder tumor 04-19-2016  non-invasive low-grade urothelial carcinoma  . History of prostate cancer urologist-  dr Jeffie Pollock-- last PSA 0.02 (summer 2017)   dx 02/ 2013-- Stage T2a, Gleason 7, PSA 1.58--  s/p  radioactive prostate seed implants 01-19-2012  . History of ST elevation myocardial infarction (STEMI)    04-27-2016  . Hyperlipidemia   . Hypertension   . S/P drug eluting coronary stent placement    04-27-2016   . Second degree Mobitz I AV block    occasional w/ first degree heart block  per cardiologist note by dr Daneen Schick   . Seizures ([place]) per pt son -- no seizure's since 1980   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  arteriorvenous cerebral malformation s/p  craniotomy 1980  . Vitamin D deficiency     Past Surgical History:  Procedure Laterality Date  . APPENDECTOMY  2011  . BLEPHAROPLASTY Bilateral revision 02-17-2016  . CARDIAC CATHETERIZATION N/A 04/27/2016   Procedure: Left Heart Cath and Coronary Angiography;  Surgeon: Belva Crome, MD;  Location: [place] [facility];  Service: Cardiovascular;  Laterality: N/A;  acute inferior wall STEMI ;  ostCx to midCx 90%,  ost1st Mrg to 1st Mrg 50%,  mid to distal RCA 100%,  pRCA 80%,  LVEF 45-50% w/ inferobasal akinesis  . CARDIAC CATHETERIZATION N/A 04/27/2016   Procedure: Temporary Pacemaker;  Surgeon: Belva Crome, MD;  Location: [place] [facility];  Service: Cardiovascular;  Laterality: N/A;  mobitz 2  second degree HB w/ heart rate 29bpm  . CARDIAC CATHETERIZATION N/A 04/27/2016   Procedure: Coronary Stent Intervention;  Surgeon: Belva Crome, MD;  Location: [place] [facility];  Service: Cardiovascular;  Laterality: N/A; DES x1 to  Proximal RCA;  DES x1 to Mid RCA  . CATARACT EXTRACTION W/ INTRAOCULAR LENS  IMPLANT, BILATERAL  2016  approx.  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 CYSTOSCOPY  01/19/2012   Procedure: CYSTOSCOPY;  Surgeon: John J Wrenn, MD;  Location: [facility];  Service: Urology;;  no seeds found in bladder  . CYSTOSCOPY W/ RETROGRADES Bilateral 04/19/2016   Procedure: CYSTOSCOPY WITH BILATERAL RETROGRADE PYELOGRAM TRANSURETHRAL RESECTION OF BLADDER TUMOR ;  Surgeon: John Wrenn, MD;  [facility];  Service: Urology;  Laterality: Bilateral;  . CYSTOSCOPY WITH BIOPSY N/A 11/10/2016   Procedure: CYSTOSCOPY WITH BIOPSY AND FULGURATION;  Surgeon: John Wrenn, MD;  Location: [facility];  Service: Urology;  Laterality: N/A;  . RADIOACTIVE SEED IMPLANT  01/19/2012    Procedure: RADIOACTIVE SEED IMPLANT;  Surgeon: John J Wrenn, MD;  Location: [facility];  Service: Urology;  Laterality: N/A;  68 seeds implanted  . TRANSTHORACIC ECHOCARDIOGRAM  06/02/2016   ef 60-65%/  trivial MR/  mild TR    There were no vitals filed for this visit.      Subjective Assessment - 03/21/17 1315    Subjective Pt reporting no pain today upon arrival. No falls since last visit. Doing better   Limitations Walking   Patient Stated Goals I want to improve my balance.   Currently in Pain? No/denies                         [facility] Adult PT Treatment/Exercise - 03/21/17 0001      Exercises   Exercises Knee/Hip     Lumbar Exercises: Aerobic   Stationary Bike Nustep level 6 x 20minutes. monitored for tolerance     Knee/Hip Exercises: Machines for Strengthening   Cybex Knee Extension 20# 3x15 reps RLE ecctentric lowering, RT LE 10#s x20   Cybex Knee Flexion 50 # 3x15 reps   Cybex Leg Press 4 pl, seat 8, 3x10 reps     Knee/Hip Exercises: Standing   Rocker Board 5 minutes  balance             Balance Exercises - 03/21/17 1334      Balance Exercises: Standing   Standing Eyes Opened Narrow base of support (BOS);Foam/compliant surface   Standing Eyes Closed Wide (BOA);Foam/compliant surface   Standing, One Foot on a Step Eyes open;6 inch  x3 min alternating   Stepping Strategy Lateral;10 reps  each side   Other Standing Exercises toe touch with colored balance pods with each LE CGA LOB x 2             PT Short Term Goals - 03/16/17 1312      PT SHORT TERM GOAL #1   Title Berg score= 46/56.   Time 2   Period Weeks   Status Achieved           PT Long Term Goals - 03/16/17 1312      PT LONG TERM GOAL #1   Title Berg score= 49-50/56.   Baseline 03/16/17 BERG = 48   Time 4   Period Weeks   Status Partially Met     PT LONG TERM GOAL #2   Title Independent with a HEP.   Period Weeks   Status On-going                Plan - 03/21/17 1321    Clinical Impression Statement Pt arrived to clinic today feeling fairly well and was able to psrform more challenging balaance act.'s, but had a LOB x 2 and needed assistance for correction. He did well with strengthening exs with mainly fatigue after RX. LTG   for Berg is ongoing due to deficits   Clinical Presentation Stable   Rehab Potential Good   PT Frequency 2x / week   PT Duration 4 weeks   PT Treatment/Interventions ADLs/Self Care Home Management;Functional mobility training;Therapeutic activities;Therapeutic exercise;Balance training;Neuromuscular re-education   PT Next Visit Plan cont with dynamic Balance training per MPT may consider right LE strengthening      1 visit left and then DC   Consulted and Agree with Plan of Care Patient      Patient will benefit from skilled therapeutic intervention in order to improve the following deficits and impairments:  Decreased activity tolerance, Abnormal gait, Decreased strength, Decreased balance  Visit Diagnosis: Unsteadiness on feet  Muscle weakness (generalized)     Problem List Patient Active Problem List   Diagnosis Date Noted  . Aortic atherosclerosis (HCC) 06/03/2016  . General weakness   . Protein-calorie malnutrition, severe (HCC)   . Bradycardia 06/02/2016  . GI bleed 06/01/2016  . 1st degree AV block   . AV block, Mobitz 2 04/27/2016  . Old MI (myocardial infarction) 04/27/2016  . Bladder cancer (HCC) 04/18/2016  . Vitamin D deficiency 10/15/2015  . Prostate cancer (HCC)   . Hypercholesterolemia   . Essential hypertension   . Seizures (HCC)   . GERD 08/22/2008  . COLONIC POLYPS, ADENOMATOUS, HX OF 08/22/2008    RAMSEUR,CHRIS, PTA 03/21/2017, 2:04 PM  [facility] [address] [place], [place], [address] Phone: [phone]   Fax:  [phone]  Name: Joshua [name] MRN: [id number] Date of Birth: 12/17/1931   

## 2017-03-27 ENCOUNTER — Ambulatory Visit: Payer: Medicare Other | Admitting: Physical Therapy

## 2017-03-27 ENCOUNTER — Encounter: Payer: Self-pay | Admitting: Physical Therapy

## 2017-03-27 DIAGNOSIS — R2681 Unsteadiness on feet: Secondary | ICD-10-CM | POA: Diagnosis not present

## 2017-03-27 DIAGNOSIS — M6281 Muscle weakness (generalized): Secondary | ICD-10-CM | POA: Diagnosis not present

## 2017-03-27 NOTE — Therapy (Signed)
[facility] [place], [place], [address] Phone: [phone]   Fax:  [phone]  Physical Therapy Treatment/Re-certification   Patient Details  Name: Joshua [name] MRN: [id number] Date of Birth: [birth year] Referring Provider: Darryl Nestle MD  Encounter Date: 03/27/2017      PT End of Session - 03/27/17 1408    Visit Number 16   Number of Visits 24   Date for PT Re-Evaluation 05/28/17   Authorization Type KX modifier   Authorization Time Period requested 8  additional visits on 03/27/17   PT Start Time 1300   PT Stop Time 1347   PT Time Calculation (min) 47 min   Equipment Utilized During Treatment Gait belt   Activity Tolerance Patient tolerated treatment well   Behavior During Therapy [facility] for tasks assessed/performed      Past Medical History:  Diagnosis Date  . 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 Arthritis   . Bilateral lower extremity edema   . Bladder tumor   . CAD (coronary artery disease) cardiologist--  dr Daneen Schick   a. 04/27/2016 PCI with DES to RCA with 50% ostial to 60% segmental mid to distal left main, and 90% thrombus filled ostial to proximal circumflex, with distal right coronary filled by collaterals from left-to-right  . COPD with emphysema ([place])   . Dyspnea on exertion   . ED (erectile dysfunction)   . First degree AV block   . GERD (gastroesophageal reflux disease)   . History of adenomatous polyp of colon    09-22-2008  . History of bladder cancer    s/p  resection bladder tumor 04-19-2016  non-invasive low-grade urothelial carcinoma  . History of prostate cancer urologist-  dr Jeffie Pollock-- last PSA 0.02 (summer 2017)   dx 02/ 2013-- Stage T2a, Gleason 7, PSA 1.58--  s/p  radioactive prostate seed implants 01-19-2012  . History of ST elevation myocardial infarction (STEMI)    04-27-2016  . Hyperlipidemia   . Hypertension   . S/P drug  eluting coronary stent placement    04-27-2016   . Second degree Mobitz I AV block    occasional w/ first degree heart block  per cardiologist note by dr Daneen Schick  . Seizures ([place]) per pt son -- no seizure's since 1980   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  arteriorvenous cerebral malformation s/p  craniotomy 1980  . Vitamin D deficiency     Past Surgical History:  Procedure Laterality Date  . APPENDECTOMY  2011  . BLEPHAROPLASTY Bilateral revision 02-17-2016  . CARDIAC CATHETERIZATION N/A 04/27/2016   Procedure: Left Heart Cath and Coronary Angiography;  Surgeon: Belva Crome, MD;  Location: [place] [facility];  Service: Cardiovascular;  Laterality: N/A;  acute inferior wall STEMI ;  ostCx to midCx 90%,  ost1st Mrg to 1st Mrg 50%,  mid to distal RCA 100%,  pRCA 80%,  LVEF 45-50% w/ inferobasal akinesis  . CARDIAC CATHETERIZATION N/A 04/27/2016   Procedure: Temporary Pacemaker;  Surgeon: Belva Crome, MD;  Location: [place] [facility];  Service: Cardiovascular;  Laterality: N/A;  mobitz 2  second degree HB w/ heart rate 29bpm  . CARDIAC CATHETERIZATION N/A 04/27/2016   Procedure: Coronary Stent Intervention;  Surgeon: Belva Crome, MD;  Location: [place] [facility];  Service: Cardiovascular;  Laterality: N/A; DES x1 to  Proximal RCA;  DES x1 to Mid RCA  . CATARACT EXTRACTION  W/ INTRAOCULAR LENS  IMPLANT, BILATERAL  2016  approx.  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 CYSTOSCOPY  01/19/2012   Procedure: CYSTOSCOPY;  Surgeon: Malka So, MD;  Location[facility];  Service: Urology;;  no seeds found in bladder  . CYSTOSCOPY W/ RETROGRADES Bilateral 04/19/2016   Procedure: CYSTOSCOPY WITH BILATERAL RETROGRADE PYELOGRAM TRANSURETHRAL RESECTION OF BLADDER TUMOR ;  Surgeon: [name], MD;  [facility];  Service: Urology;  Laterality: Bilateral;  . CYSTOSCOPY WITH BIOPSY N/A 11/10/2016   Procedure: CYSTOSCOPY  WITH BIOPSY AND FULGURATION;  Surgeon: [name], MD;  Location: [facility];  Service: Urology;  Laterality: N/A;  . RADIOACTIVE SEED IMPLANT  01/19/2012   Procedure: RADIOACTIVE SEED IMPLANT;  Surgeon: Malka So, MD;  Location: [facility];  Service: Urology;  Laterality: N/A;  68 seeds implanted  . TRANSTHORACIC ECHOCARDIOGRAM  06/02/2016   ef 60-65%/  trivial MR/  mild TR    There were no vitals filed for this visit.      Subjective Assessment - 03/27/17 1407    Subjective Pt arriving to therapy today reporting no pain. No falls since last visit. Pt still reporting he wants to get his balance better.    Patient Stated Goals I want to improve my balance.   Currently in Pain? No/denies   Multiple Pain Sites No                         [facility] Adult PT Treatment/Exercise - 03/27/17 0001      Exercises   Exercises Knee/Hip     Lumbar Exercises: Aerobic   Stationary Bike Nustep level 6 x 39mnutes. monitored for tolerance             Balance Exercises - 03/27/17 1400      Balance Exercises: Standing   Rockerboard Anterior/posterior;30 seconds;Intermittent UE support   Partial Tandem Stance Eyes open;Upper extremity support 1;5 reps   Sidestepping Foam/compliant support;5 reps;Limitations   Step Over Hurdles / Cones sidestepping over foam balance beam multiple reps each direction in order to reach 5 reps with not stepping on the foam mat. Pt required hand held assistance x 2.    Other Standing Exercises BOSU ball 5-10 second intervals  with no UE support, 180 degree turns with CGA with LOB noted x3 out of 8 trials.             PT Short Term Goals - 03/16/17 1312      PT SHORT TERM GOAL #1   Title Berg score= 46/56.   Time 2   Period Weeks   Status Achieved           PT Long Term Goals - 03/27/17 1409      PT LONG TERM GOAL #1   Title Berg score= 49-50/56.   Baseline 03/16/17 BERG = 48   Time 4   Period  Weeks   Status Partially Met     PT LONG TERM GOAL #2   Title Independent with a HEP.   Time 4   Period Weeks   Status On-going     PT LONG TERM GOAL #3   Title Pt will be able to amb on community level surfaces over curb steps and up and down 3 steps using only his straight staff >/= 1000 feet   Time 4   Period Weeks   Status New  Plan - 2017-04-11 1414    Clinical Impression Statement Pt arriving to clinic today reporting no pain and no falls reported. Pt has made great progress with therapy with balance exercises still requiring Hand Held Assistance and CGA with sevaral dynamic balance exercises. Pt still progressing toward LTG's of improving his BERG balance score to >/= 49/56. Pt currently at 36/56. New LTG made today with community amb over curb step and 3 steps using a straight staff/cane. I have requested 8 additional visits to progress toward pt's LTG's secondary to balance deficits.    Clinical Presentation Stable   Rehab Potential Good   PT Frequency 2x / week   PT Duration 4 weeks   PT Treatment/Interventions ADLs/Self Care Home Management;Functional mobility training;Therapeutic activities;Therapeutic exercise;Balance training;Neuromuscular re-education   PT Next Visit Plan cont with dynamic Balance training per MPT may consider right LE strengthening      1 visit left and then DC   PT Home Exercise Plan Issued handout for hip flexion, hip abduction, heel raises and hip extension.   Consulted and Agree with Plan of Care Patient      Patient will benefit from skilled therapeutic intervention in order to improve the following deficits and impairments:  Decreased activity tolerance, Abnormal gait, Decreased strength, Decreased balance  Visit Diagnosis: Unsteadiness on feet - Plan: PT plan of care cert/re-cert  Muscle weakness (generalized) - Plan: PT plan of care cert/re-cert       G-Codes - 11-Apr-2017 1411    Functional Assessment Tool Used  (Outpatient Only) Clinical judgement....10th visit.   Functional Limitation Mobility: Walking and moving around   Mobility: Walking and Moving Around Current Status [phone]) At least 40 percent but less than 60 percent impaired, limited or restricted   Mobility: Walking and Moving Around Goal Status [phone]) At least 40 percent but less than 60 percent impaired, limited or restricted      Problem List Patient Active Problem List   Diagnosis Date Noted  . Aortic atherosclerosis ([place]) 06/03/2016  . General weakness   . Protein-calorie malnutrition, severe ([place])   . Bradycardia 06/02/2016  . GI bleed 06/01/2016  . 1st degree AV block   . AV block, Mobitz 2 04/27/2016  . Old MI (myocardial infarction) 04/27/2016  . Bladder cancer ([place]) 04/18/2016  . Vitamin D deficiency 10/15/2015  . Prostate cancer ([place])   . Hypercholesterolemia   . Essential hypertension   . Seizures ([place])   . GERD 08/22/2008  . COLONIC POLYPS, ADENOMATOUS, HX OF 08/22/2008    Oretha Caprice, MPT 2017/04/11, 2:21 PM  [facility] [place], [place], [address] Phone: [phone]   Fax:  [phone]  Name: STONEWALL DOSS MRN: [id number] Date of Birth: 06-11-1932

## 2017-04-05 DIAGNOSIS — Z8546 Personal history of malignant neoplasm of prostate: Secondary | ICD-10-CM | POA: Diagnosis not present

## 2017-04-05 DIAGNOSIS — R3915 Urgency of urination: Secondary | ICD-10-CM | POA: Diagnosis not present

## 2017-04-05 DIAGNOSIS — Z8551 Personal history of malignant neoplasm of bladder: Secondary | ICD-10-CM | POA: Diagnosis not present

## 2017-04-13 ENCOUNTER — Ambulatory Visit (INDEPENDENT_AMBULATORY_CARE_PROVIDER_SITE_OTHER): Payer: Medicare Other | Admitting: Family Medicine

## 2017-04-13 ENCOUNTER — Encounter: Payer: Self-pay | Admitting: Family Medicine

## 2017-04-13 VITALS — BP 130/66 | HR 55 | Temp 97.0°F | Ht 66.0 in | Wt 179.0 lb

## 2017-04-13 DIAGNOSIS — D692 Other nonthrombocytopenic purpura: Secondary | ICD-10-CM | POA: Diagnosis not present

## 2017-04-13 DIAGNOSIS — G40909 Epilepsy, unspecified, not intractable, without status epilepticus: Secondary | ICD-10-CM | POA: Diagnosis not present

## 2017-04-13 DIAGNOSIS — I10 Essential (primary) hypertension: Secondary | ICD-10-CM | POA: Diagnosis not present

## 2017-04-13 DIAGNOSIS — E559 Vitamin D deficiency, unspecified: Secondary | ICD-10-CM | POA: Diagnosis not present

## 2017-04-13 DIAGNOSIS — E7800 Pure hypercholesterolemia, unspecified: Secondary | ICD-10-CM | POA: Diagnosis not present

## 2017-04-13 DIAGNOSIS — K219 Gastro-esophageal reflux disease without esophagitis: Secondary | ICD-10-CM

## 2017-04-13 DIAGNOSIS — I2510 Atherosclerotic heart disease of native coronary artery without angina pectoris: Secondary | ICD-10-CM | POA: Diagnosis not present

## 2017-04-13 DIAGNOSIS — R2681 Unsteadiness on feet: Secondary | ICD-10-CM

## 2017-04-13 NOTE — Patient Instructions (Addendum)
Medicare Annual Wellness Visit  [place] and the medical providers at [facility] strive to bring you the best medical care.  In doing so we not only want to address your current medical conditions and concerns but also to detect new conditions early and prevent illness, disease and health-related problems.    Medicare offers a yearly Wellness Visit which allows our clinical staff to assess your need for preventative services including immunizations, lifestyle education, counseling to decrease risk of preventable diseases and screening for fall risk and other medical concerns.    This visit is provided free of charge (no copay) for all Medicare recipients. The clinical pharmacists at [facility] have begun to conduct these Wellness Visits which will also include a thorough review of all your medications.    As you primary medical provider recommend that you make an appointment for your Annual Wellness Visit if you have not done so already this year.  You may set up this appointment before you leave today or you may call back [phone]) and schedule an appointment.  Please make sure when you call that you mention that you are scheduling your Annual Wellness Visit with the clinical pharmacist so that the appointment may be made for the proper length of time.     Continue current medications. Continue good therapeutic lifestyle changes which include good diet and exercise. Fall precautions discussed with patient. If an FOBT was given today- please return it to our front desk. If you are over 18 years old - you may need Prevnar 34 or the adult Pneumonia vaccine.  **Flu shots are available--- please call and schedule a FLU-CLINIC appointment**  After your visit with [place] today you will receive a survey in the mail or online from [employer] regarding your care with [place]. Please take a moment to fill this out. Your feedback is very  important to [place] as you can help [place] better understand your patient needs as well as improve your experience and satisfaction. WE CARE ABOUT YOU!!!   Continue to follow-up with urology and cardiology Continue with physical therapy as this is essential for you to keep your balance and your strength Continue with support hose Continue with current medications Drink plenty of fluids and stay well hydrated

## 2017-04-13 NOTE — Progress Notes (Signed)
Subjective:    Patient ID: Joshua [name], male    DOB: 02-10-1932, 83 y.o.   MRN: [id number]  HPI Pt here for follow up and management of chronic medical problems which includes hypertension and hyperlipidemia. He is taking medication regularly.The patient is doing well. He is being followed regularly by the cardiologist because of his heart disease and myocardial infarction. He is also followed by the urologist because of prostate cancer and bladder cancer. The most recent note was reviewed and his most recent cystoscopy was clear as far as the bladder cancer is concerned and this is good. The patient is using a cane and is out doing things but still try to be careful not to fall. His LFTs seems positive considering how low down he was a couple of years ago. He will get lab work today. The patient is doing great. He is been getting physical therapy and plans to continue that [place]door to keep his strength going and his balance good. He has a Lifeline that he wears around his neck that works anywhere and this is also good. He seems to be much more conscious of having good walking habits using his walker and using his cane and I'm thankful for this. He denies any chest pain or shortness of breath. He does have a visit coming up in August with Dr. Daneen Schick, his cardiologist. He has not had any chest pain or shortness of breath. He has no trouble with swallowing heartburn indigestion nausea vomiting diarrhea or blood in the stool. He's passing his water with fairly good control without problems. The most recent note from the urologist indicated that his bladder was clear of cancer and we were all excited about that.   Patient Active Problem List   Diagnosis Date Noted  . Aortic atherosclerosis ([place]) 06/03/2016  . General weakness   . Protein-calorie malnutrition, severe ([place])   . Bradycardia 06/02/2016  . GI bleed 06/01/2016  . 1st degree AV block   . AV block, Mobitz 2 04/27/2016  . Old MI  (myocardial infarction) 04/27/2016  . Bladder cancer ([place]) 04/18/2016  . Vitamin D deficiency 10/15/2015  . Prostate cancer ([place])   . Hypercholesterolemia   . Essential hypertension   . Seizures ([place])   . GERD 08/22/2008  . COLONIC POLYPS, ADENOMATOUS, HX OF 08/22/2008   Outpatient Encounter Prescriptions as of 04/13/2017  Medication Sig  . aspirin EC 81 MG tablet Take 1 tablet (81 mg total) by mouth daily.  Marland Kitchen atorvastatin (LIPITOR) 20 MG tablet Take 1 tablet (20 mg total) by mouth daily.  . Cholecalciferol (VITAMIN D3) 1000 units CAPS Take 1 capsule by mouth daily.  . citalopram (CELEXA) 20 MG tablet Take 1 tablet (20 mg total) by mouth daily.  . clopidogrel (PLAVIX) 75 MG tablet Take 1 tablet (75 mg total) by mouth daily.  . Multiple Vitamins-Minerals (PRESERVISION AREDS 2) CAPS Take 1 capsule by mouth daily.  Marland Kitchen omeprazole (PRILOSEC) 20 MG capsule Take 20 mg by mouth daily.  Marland Kitchen PHENobarbital (LUMINAL) 64.8 MG tablet Take 1 tablet (64.8 mg total) by mouth 2 (two) times daily.  . phenytoin (DILANTIN) 100 MG ER capsule Take 200 mg by mouth 2 (two) times daily.   Marland Kitchen spironolactone (ALDACTONE) 25 MG tablet Take 0.5 tablets (12.5 mg total) by mouth daily.   No facility-administered encounter medications on file as of 04/13/2017.       Review of Systems  Constitutional: Negative.   HENT: Negative.   Eyes: Negative.  Respiratory: Negative.   Cardiovascular: Negative.   Gastrointestinal: Negative.   Endocrine: Negative.   Genitourinary: Negative.   Musculoskeletal: Negative.   Skin: Negative.   Allergic/Immunologic: Negative.   Neurological: Negative.   Hematological: Negative.   Psychiatric/Behavioral: Negative.        Objective:   Physical Exam  Constitutional: He is oriented to person, place, and time. He appears well-developed and well-nourished. No distress.  Patient is positive alert and doing extremely well using his walking stick.  HENT:  Head: Normocephalic and  atraumatic.  Right Ear: External ear normal.  Left Ear: External ear normal.  Nose: Nose normal.  Mouth/Throat: Oropharynx is clear and moist. No oropharyngeal exudate.  Eyes: Pupils are equal, round, and reactive to light. Conjunctivae and EOM are normal. Right eye exhibits no discharge. Left eye exhibits no discharge. No scleral icterus.  Neck: Normal range of motion. Neck supple. No tracheal deviation present. No thyromegaly present.  Cardiovascular: Normal rate, regular rhythm and normal heart sounds.   No murmur heard. The heart is regular at 72/m  Pulmonary/Chest: [place] normal and breath sounds normal. No respiratory distress. He has no wheezes. He has no rales. He exhibits no tenderness.  Clear anteriorly and posteriorly  Abdominal: Soft. Bowel sounds are normal. He exhibits no mass. There is no tenderness. There is no rebound and no guarding.  No abdominal tenderness masses or organ enlargement or bruits  Genitourinary:  Genitourinary Comments: The patient is followed regularly by the urologist for his bladder cancer and prostate cancer.  Musculoskeletal: Normal range of motion. He exhibits no edema.  Fairly good strength and range of motion with all extremities and uses walking cane for stability. He is recognize the importance of doing physical therapy and has plans to continue this indefinitely  Lymphadenopathy:    He has no cervical adenopathy.  Neurological: He is alert and oriented to person, place, and time. He has normal reflexes. No cranial nerve deficit.  Skin: Skin is warm and dry. No rash noted.  Psychiatric: He has a normal mood and affect. His behavior is normal. Judgment and thought content normal.  Nursing note and vitals reviewed.  BP 130/66 (BP Location: Left Arm)   Pulse (!) 55   Temp (!) 97 F (36.1 C) (Oral)   Ht _0  (1.676 m)   Wt 179 lb (81.2 kg)   BMI 28.89 kg/m         Assessment & Plan:  1. Hypercholesterolemia -Tinny with as aggressive  therapeutic lifestyle changes as possible and atorvastatin pending results of lab work - CBC with Differential/Platelet - Lipid panel  2. Essential hypertension -The blood pressure is good today and he will continue with current treatment - BMP8+EGFR - CBC with Differential/Platelet - Hepatic function panel  3. Vitamin D deficiency -Continue with weightbearing exercise fall prevention and current vitamin D dose pending results of lab work - CBC with Differential/Platelet - VITAMIN D 25 Hydroxy (Vit-D Deficiency, Fractures)  4. ASCVD (arteriosclerotic cardiovascular disease) -Follow-up with cardiology as planned - CBC with Differential/Platelet  5. Gastroesophageal reflux disease, esophagitis presence not specified -No complaints today with reflux disease - CBC with Differential/Platelet  6. Seizure disorder ([place]) -Continue with antiseizure medications - CBC with Differential/Platelet - Phenobarbital level - Phenytoin level, free and total  7. Gait instability -Continue with physical therapy as needed walker and cane. - CBC with Differential/Platelet  8. Senile purpura ([place]) -These continue to be a problem for the patient has him on his abdomen and  arms.  Patient Instructions                       Medicare Annual Wellness Visit  [facility] and the medical providers at [facility] strive to bring you the best medical care.  In doing so we not only want to address your current medical conditions and concerns but also to detect new conditions early and prevent illness, disease and health-related problems.    Medicare offers a yearly Wellness Visit which allows our clinical staff to assess your need for preventative services including immunizations, lifestyle education, counseling to decrease risk of preventable diseases and screening for fall risk and other medical concerns.    This visit is provided free of charge (no copay) for all Medicare recipients.  The clinical pharmacists at [facility] have begun to conduct these Wellness Visits which will also include a thorough review of all your medications.    As you primary medical provider recommend that you make an appointment for your Annual Wellness Visit if you have not done so already this year.  You may set up this appointment before you leave today or you may call back ([phone]) and schedule an appointment.  Please make sure when you call that you mention that you are scheduling your Annual Wellness Visit with the clinical pharmacist so that the appointment may be made for the proper length of time.     Continue current medications. Continue good therapeutic lifestyle changes which include good diet and exercise. Fall precautions discussed with patient. If an FOBT was given today- please return it to our front desk. If you are over 60 years old - you may need Prevnar 94 or the adult Pneumonia vaccine.  **Flu shots are available--- please call and schedule a FLU-CLINIC appointment**  After your visit with [place] today you will receive a survey in the mail or online from [employer] regarding your care with [place]. Please take a moment to fill this out. Your feedback is very important to [place] as you can help [place] better understand your patient needs as well as improve your experience and satisfaction. WE CARE ABOUT YOU!!!   Continue to follow-up with urology and cardiology Continue with physical therapy as this is essential for you to keep your balance and your strength Continue with support hose Continue with current medications Drink plenty of fluids and stay well hydrated   Arrie Senate MD

## 2017-04-14 ENCOUNTER — Telehealth: Payer: Self-pay | Admitting: Family Medicine

## 2017-04-14 LAB — CBC WITH DIFFERENTIAL/PLATELET
BASOS ABS: 0.1 10*3/uL (ref 0.0–0.2)
BASOS: 1 %
EOS (ABSOLUTE): 0.7 10*3/uL — ABNORMAL HIGH (ref 0.0–0.4)
Eos: 6 %
Hematocrit: 39.7 % (ref 37.5–51.0)
Hemoglobin: 13.1 g/dL (ref 13.0–17.7)
Immature Grans (Abs): 0 10*3/uL (ref 0.0–0.1)
Immature Granulocytes: 0 %
LYMPHS ABS: 2.6 10*3/uL (ref 0.7–3.1)
LYMPHS: 24 %
MCH: 27.9 pg (ref 26.6–33.0)
MCHC: 33 g/dL (ref 31.5–35.7)
MCV: 85 fL (ref 79–97)
Monocytes Absolute: 1.5 10*3/uL — ABNORMAL HIGH (ref 0.1–0.9)
Monocytes: 14 %
NEUTROS ABS: 5.9 10*3/uL (ref 1.4–7.0)
NEUTROS PCT: 55 %
PLATELETS: 275 10*3/uL (ref 150–379)
RBC: 4.69 x10E6/uL (ref 4.14–5.80)
RDW: 17.1 % — AB (ref 12.3–15.4)
WBC: 10.9 10*3/uL — AB (ref 3.4–10.8)

## 2017-04-14 LAB — HEPATIC FUNCTION PANEL
ALT: 21 IU/L (ref 0–44)
AST: 29 IU/L (ref 0–40)
Albumin: 4.2 g/dL (ref 3.5–4.7)
Alkaline Phosphatase: 169 IU/L — ABNORMAL HIGH (ref 39–117)
BILIRUBIN, DIRECT: 0.12 mg/dL (ref 0.00–0.40)
Bilirubin Total: 0.3 mg/dL (ref 0.0–1.2)
Total Protein: 6.9 g/dL (ref 6.0–8.5)

## 2017-04-14 LAB — BMP8+EGFR
BUN/Creatinine Ratio: 13 (ref 10–24)
BUN: 12 mg/dL (ref 8–27)
CALCIUM: 9.7 mg/dL (ref 8.6–10.2)
CO2: 27 mmol/L (ref 20–29)
CREATININE: 0.91 mg/dL (ref 0.76–1.27)
Chloride: 100 mmol/L (ref 96–106)
GFR calc Af Amer: 90 mL/min/{1.73_m2} (ref >59)
GFR calc non Af Amer: 78 mL/min/{1.73_m2} (ref >59)
Glucose: 85 mg/dL (ref 65–99)
POTASSIUM: 4.5 mmol/L (ref 3.5–5.2)
Sodium: 140 mmol/L (ref 134–144)

## 2017-04-14 LAB — LIPID PANEL
CHOLESTEROL TOTAL: 164 mg/dL (ref 100–199)
Chol/HDL Ratio: 2.2 ratio (ref 0.0–5.0)
HDL: 74 mg/dL (ref >39)
LDL Calculated: 75 mg/dL (ref 0–99)
TRIGLYCERIDES: 76 mg/dL (ref 0–149)
VLDL Cholesterol Cal: 15 mg/dL (ref 5–40)

## 2017-04-14 LAB — VITAMIN D 25 HYDROXY (VIT D DEFICIENCY, FRACTURES): VIT D 25 HYDROXY: 37.4 ng/mL (ref 30.0–100.0)

## 2017-04-14 LAB — PHENYTOIN LEVEL, FREE AND TOTAL
Phenytoin, Free: 0.8 ug/mL — ABNORMAL LOW (ref 1.0–2.0)
Phenytoin, Serum: 10.5 ug/mL (ref 10.0–20.0)

## 2017-04-14 LAB — PHENOBARBITAL LEVEL: PHENOBARBITAL, SERUM: 23 ug/mL (ref 15–40)

## 2017-04-14 NOTE — Telephone Encounter (Signed)
Pt aware.

## 2017-04-24 ENCOUNTER — Encounter: Payer: Self-pay | Admitting: Interventional Cardiology

## 2017-05-07 NOTE — Progress Notes (Signed)
Cardiology Office Note    Date:  05/09/2017   ID:  Joshua [name], DOB July 04, 1932, MRN [id number]  PCP:  Joshua Herb, MD  Cardiologist: Joshua Grooms, MD   Chief Complaint  Patient presents with  . Hospitalization Follow-up    Second-degree heart block  . Coronary Artery Disease    History of Present Illness:  Joshua [name] is a 83 y.o. male history of sz, prostate CA, bladder CAHTN, HLD, GERD, recent STEMI 05/10/2016 w/ DES toRCA complicated by Mobitz II-->HR improved and PPM not indicated. Op Holter results pending who again admitted 9/13-9/16 for generalized weakness   He is doing okay but notices some increase in shortness of breath. He has had right greater than left lower extremity swelling. As a prior history of second-degree heart block. He has not had syncope. He did have a fall over the last 6 months he feels was related to tripping and not syncope.  Past Medical History:  Diagnosis Date  . 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 Arthritis   . Bilateral lower extremity edema   . Bladder tumor   . CAD (coronary artery disease) cardiologist--  dr Joshua [name]   a. 04/27/2016 PCI with DES to RCA with 50% ostial to 60% segmental mid to distal left main, and 90% thrombus filled ostial to proximal circumflex, with distal right coronary filled by collaterals from left-to-right  . COPD with emphysema ([place])   . Dyspnea on exertion   . ED (erectile dysfunction)   . First degree AV block   . GERD (gastroesophageal reflux disease)   . History of adenomatous polyp of colon    09-22-2008  . History of bladder cancer    s/p  resection bladder tumor 04-19-2016  non-invasive low-grade urothelial carcinoma  . History of prostate cancer urologist-  dr Joshua [name]-- last PSA 0.02 (summer 2017)   dx 02/ 2013-- Stage T2a, Gleason 7, PSA 1.58--  s/p  radioactive prostate seed implants 01-19-2012  . History of ST elevation  myocardial infarction (STEMI)    04-27-2016  . Hyperlipidemia   . Hypertension   . S/P drug eluting coronary stent placement    04-27-2016   . Second degree Mobitz I AV block    occasional w/ first degree heart block  per cardiologist note by dr Joshua [name]  . Seizures (Joshua [name]) per pt son -- no seizure's since 1980   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  arteriorvenous cerebral malformation s/p  craniotomy 1980  . Vitamin D deficiency     Past Surgical History:  Procedure Laterality Date  . APPENDECTOMY  2011  . BLEPHAROPLASTY Bilateral revision 02-17-2016  . CARDIAC CATHETERIZATION N/A 04/27/2016   Procedure: Left Heart Cath and Coronary Angiography;  Surgeon: Joshua Crome, MD;  Location: [facility];  Service: Cardiovascular;  Laterality: N/A;  acute inferior wall STEMI ;  ostCx to midCx 90%,  ost1st Mrg to 1st Mrg 50%,  mid to distal RCA 100%,  pRCA 80%,  LVEF 45-50% w/ inferobasal akinesis  . CARDIAC CATHETERIZATION N/A 04/27/2016   Procedure: Temporary Pacemaker;  Surgeon: Joshua Crome, MD;  Location: [facility];  Service: Cardiovascular;  Laterality: N/A;  mobitz 2  second degree HB w/ heart rate 29bpm  . CARDIAC CATHETERIZATION N/A 04/27/2016   Procedure: Coronary Stent Intervention;  Surgeon: Joshua Crome, MD;  Location: [facility];  Service:  Cardiovascular;  Laterality: N/A; DES x1 to  Proximal RCA;  DES x1 to Mid RCA  . CATARACT EXTRACTION W/ INTRAOCULAR LENS  IMPLANT, BILATERAL  2016  approx.  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 CYSTOSCOPY  01/19/2012   Procedure: CYSTOSCOPY;  Surgeon: Joshua So, MD;  Location: [facility];  Service: Urology;;  no seeds found in bladder  . CYSTOSCOPY W/ RETROGRADES Bilateral 04/19/2016   Procedure: CYSTOSCOPY WITH BILATERAL RETROGRADE PYELOGRAM TRANSURETHRAL RESECTION OF BLADDER TUMOR ;  Surgeon: Joshua Seal, MD;  Location: [facility];  Service:  Urology;  Laterality: Bilateral;  . CYSTOSCOPY WITH BIOPSY N/A 11/10/2016   Procedure: CYSTOSCOPY WITH BIOPSY AND FULGURATION;  Surgeon: Joshua Seal, MD;  Location: [facility];  Service: Urology;  Laterality: N/A;  . RADIOACTIVE SEED IMPLANT  01/19/2012   Procedure: RADIOACTIVE SEED IMPLANT;  Surgeon: Joshua So, MD;  Location: [facility];  Service: Urology;  Laterality: N/A;  68 seeds implanted  . TRANSTHORACIC ECHOCARDIOGRAM  06/02/2016   ef 60-65%/  trivial MR/  mild TR    Current Medications: Outpatient Medications Prior to Visit  Medication Sig Dispense Refill  . aspirin EC 81 MG tablet Take 1 tablet (81 mg total) by mouth daily. 90 tablet 3  . atorvastatin (LIPITOR) 20 MG tablet Take 1 tablet (20 mg total) by mouth daily. 90 tablet 3  . Cholecalciferol (VITAMIN D3) 1000 units CAPS Take 1 capsule by mouth daily.    . citalopram (CELEXA) 20 MG tablet Take 1 tablet (20 mg total) by mouth daily. 90 tablet 1  . clopidogrel (PLAVIX) 75 MG tablet Take 1 tablet (75 mg total) by mouth daily. 90 tablet 3  . Multiple Vitamins-Minerals (PRESERVISION AREDS 2) CAPS Take 1 capsule by mouth daily.    Marland Kitchen omeprazole (PRILOSEC) 20 MG capsule Take 20 mg by mouth daily.    Marland Kitchen PHENobarbital (LUMINAL) 64.8 MG tablet Take 1 tablet (64.8 mg total) by mouth 2 (two) times daily. 180 tablet 3  . phenytoin (DILANTIN) 100 MG ER capsule Take 200 mg by mouth 2 (two) times daily.     Marland Kitchen spironolactone (ALDACTONE) 25 MG tablet Take 0.5 tablets (12.5 mg total) by mouth daily. 45 tablet 3   No facility-administered medications prior to visit.      Allergies:   Penicillins   Social History   Social History  . Marital status: Widowed    Spouse name: N/A  . Number of children: 2  . Years of education: N/A   Occupational History  . Retired    Social History Main Topics  . Smoking status: Former Smoker    Packs/day: 2.00    Years: 15.00    Quit date: 01/15/1973  . Smokeless  tobacco: Never Used  . Alcohol use Yes     Comment: occasionally  . Drug use: No  . Sexual activity: Not on file   Other Topics Concern  . Not on file   Social History Narrative   [facility] to [facility] after MI for rehab.     Family History:  The patient's family history includes Emphysema in his father and mother; Stroke in his mother.   ROS:   Please see the history of present illness.    Right leg swelling greater than left. Support stockings helped.  All other systems reviewed and are negative.   PHYSICAL EXAM:   VS:  BP 126/62 (BP Location: Left Arm)  Pulse (!) 54   Ht 6' (1.829 m)   Wt 182 lb 12.8 oz (82.9 kg)   BMI 24.79 kg/m    GEN: Well nourished, well developed, in no acute distress  HEENT: normal  Neck: no JVD, carotid bruits, or masses Cardiac: RRR; no murmurs, rubs, or gallops. Trace bilateral lower extremity edema. Respiratory:  clear to auscultation bilaterally, normal work of breathing GI: soft, nontender, nondistended, + BS MS: no deformity or atrophy  Skin: warm and dry, no rash Neuro:  Alert and Oriented x 3, Strength and sensation are intact Psych: euthymic mood, full affect  Wt Readings from Last 3 Encounters:  05/09/17 182 lb 12.8 oz (82.9 kg)  04/13/17 179 lb (81.2 kg)  12/28/16 174 lb (78.9 kg)      Studies/Labs Reviewed:   EKG:  EKG  Sinus rhythm with [place] second-degree heart block. Occasional PACs are noted. Compared to prior tracing, the Mobitz type I second-degree heart block is new.  Recent Labs: 06/01/2016: B Natriuretic Peptide 107.1 11/11/2016: NT-Pro BNP 380 04/13/2017: ALT 21; BUN 12; Creatinine, Ser 0.91; Hemoglobin 13.1; Platelets 275; Potassium 4.5; Sodium 140   Lipid Panel    Component Value Date/Time   CHOL 164 04/13/2017 1225   TRIG 76 04/13/2017 1225   TRIG 85 01/20/2016 0958   HDL 74 04/13/2017 1225   HDL 69 01/20/2016 0958   CHOLHDL 2.2 04/13/2017 1225   CHOLHDL 3.3 04/27/2016 1133   VLDL 16 04/27/2016  1133   LDLCALC 75 04/13/2017 1225   LDLCALC 99 05/21/2014 1101    Additional studies/ records that were reviewed today include:  No new data    ASSESSMENT:    1. AV block, Mobitz 2   2. Old MI (myocardial infarction)   3. Chronic diastolic heart failure ([place])   4. CAD in native artery   5. Essential hypertension   6. Hypercholesterolemia      PLAN:  In order of problems listed above:  1. Second-degree heart block is noted on the resting EKG. No significant bradycardias noted. 48-hour Holter monitor will be done. 2. Not addressed 3. Increased volume suggests a component of CHF. Increase Aldactone to 25 mg per day. Basic metabolic panel 9-16 days later. 4. Asymptomatic with reference to angina. 5. Adequate blood pressure control. 6. LDL target should be less than 70.   Clinical follow-up in 3 months. Increase intensity of diuretic therapy to mobilize fluid. Continuous monitoring for 48 hours to exclude chronotropic incompetence and transient high-grade AV block.    Medication Adjustments/Labs and Tests Ordered: Current medicines are reviewed at length with the patient today.  Concerns regarding medicines are outlined above.  Medication changes, Labs and Tests ordered today are listed in the Patient Instructions below. Patient Instructions  Medication Instructions:  1) INCREASE Spironolactone to 25mg  once daily  Labwork: Your physician recommends that you return for lab work in: 7-10 days (BMET)   Testing/Procedures: Your physician has recommended that you wear a 48 hour holter monitor. Holter monitors are medical devices that record the heart's electrical activity. Doctors most often use these monitors to diagnose arrhythmias. Arrhythmias are problems with the speed or rhythm of the heartbeat. The monitor is a small, portable device. You can wear one while you do your normal daily activities. This is usually used to diagnose what is causing palpitations/syncope (passing  out).    Follow-Up: Your physician recommends that you schedule a follow-up appointment in: 3 months with Dr. Tamala Julian.    Any Other  Special Instructions Will Be Listed Below (If Applicable).     If you need a refill on your cardiac medications before your next appointment, please call your pharmacy.      Signed, Joshua Grooms, MD  05/09/2017 4:25 PM    [facility] [place], [place], [place]  [address] Phone: [phone]; Fax: [phone]

## 2017-05-09 ENCOUNTER — Ambulatory Visit (INDEPENDENT_AMBULATORY_CARE_PROVIDER_SITE_OTHER): Payer: Medicare Other | Admitting: Interventional Cardiology

## 2017-05-09 ENCOUNTER — Other Ambulatory Visit: Payer: Self-pay | Admitting: *Deleted

## 2017-05-09 VITALS — BP 126/62 | HR 54 | Ht 72.0 in | Wt 182.8 lb

## 2017-05-09 DIAGNOSIS — I5032 Chronic diastolic (congestive) heart failure: Secondary | ICD-10-CM | POA: Diagnosis not present

## 2017-05-09 DIAGNOSIS — I441 Atrioventricular block, second degree: Secondary | ICD-10-CM

## 2017-05-09 DIAGNOSIS — I252 Old myocardial infarction: Secondary | ICD-10-CM | POA: Diagnosis not present

## 2017-05-09 DIAGNOSIS — I2510 Atherosclerotic heart disease of native coronary artery without angina pectoris: Secondary | ICD-10-CM

## 2017-05-09 DIAGNOSIS — I10 Essential (primary) hypertension: Secondary | ICD-10-CM

## 2017-05-09 DIAGNOSIS — E7800 Pure hypercholesterolemia, unspecified: Secondary | ICD-10-CM | POA: Diagnosis not present

## 2017-05-09 MED ORDER — SPIRONOLACTONE 25 MG PO TABS: 25.0000 mg | ORAL_TABLET | Freq: Every day | ORAL | 3 refills | Status: DC

## 2017-05-09 MED ORDER — CITALOPRAM HYDROBROMIDE 20 MG PO TABS: 20.0000 mg | ORAL_TABLET | Freq: Every day | ORAL | 1 refills | Status: DC

## 2017-05-09 NOTE — Patient Instructions (Signed)
Medication Instructions:  1) INCREASE Spironolactone to 25mg  once daily  Labwork: Your physician recommends that you return for lab work in: 7-10 days (BMET)   Testing/Procedures: Your physician has recommended that you wear a 48 hour holter monitor. Holter monitors are medical devices that record the heart's electrical activity. Doctors most often use these monitors to diagnose arrhythmias. Arrhythmias are problems with the speed or rhythm of the heartbeat. The monitor is a small, portable device. You can wear one while you do your normal daily activities. This is usually used to diagnose what is causing palpitations/syncope (passing out).    Follow-Up: Your physician recommends that you schedule a follow-up appointment in: 3 months with Dr. Tamala Julian.    Any Other Special Instructions Will Be Listed Below (If Applicable).     If you need a refill on your cardiac medications before your next appointment, please call your pharmacy.

## 2017-05-24 ENCOUNTER — Ambulatory Visit (INDEPENDENT_AMBULATORY_CARE_PROVIDER_SITE_OTHER): Payer: Medicare Other

## 2017-05-24 ENCOUNTER — Other Ambulatory Visit: Payer: Medicare Other | Admitting: *Deleted

## 2017-05-24 DIAGNOSIS — I441 Atrioventricular block, second degree: Secondary | ICD-10-CM

## 2017-05-24 DIAGNOSIS — I10 Essential (primary) hypertension: Secondary | ICD-10-CM | POA: Diagnosis not present

## 2017-05-25 LAB — BASIC METABOLIC PANEL
BUN/Creatinine Ratio: 19 (ref 10–24)
BUN: 17 mg/dL (ref 8–27)
CO2: 24 mmol/L (ref 20–29)
Calcium: 9.6 mg/dL (ref 8.6–10.2)
Chloride: 98 mmol/L (ref 96–106)
Creatinine, Ser: 0.88 mg/dL (ref 0.76–1.27)
GFR calc Af Amer: 92 mL/min/{1.73_m2} (ref >59)
GFR calc non Af Amer: 79 mL/min/{1.73_m2} (ref >59)
Glucose: 85 mg/dL (ref 65–99)
Potassium: 4.3 mmol/L (ref 3.5–5.2)
Sodium: 138 mmol/L (ref 134–144)

## 2017-06-02 ENCOUNTER — Other Ambulatory Visit: Payer: Self-pay | Admitting: Interventional Cardiology

## 2017-06-06 ENCOUNTER — Telehealth: Payer: Self-pay | Admitting: Interventional Cardiology

## 2017-06-06 NOTE — Telephone Encounter (Signed)
New message ° ° ° ° ° °Returning a call to the nurse to get monitor results °

## 2017-06-06 NOTE — Telephone Encounter (Signed)
Informed pt of monitor results. Pt verbalized understanding and was in agreement with plan. Advised I would send message to EP scheduler to contact him with appt.

## 2017-06-12 ENCOUNTER — Ambulatory Visit (INDEPENDENT_AMBULATORY_CARE_PROVIDER_SITE_OTHER): Payer: Medicare Other | Admitting: Internal Medicine

## 2017-06-12 ENCOUNTER — Encounter: Payer: Self-pay | Admitting: Internal Medicine

## 2017-06-12 VITALS — BP 152/64 | HR 61 | Ht 72.0 in | Wt 183.0 lb

## 2017-06-12 DIAGNOSIS — I119 Hypertensive heart disease without heart failure: Secondary | ICD-10-CM | POA: Diagnosis not present

## 2017-06-12 DIAGNOSIS — I441 Atrioventricular block, second degree: Secondary | ICD-10-CM | POA: Diagnosis not present

## 2017-06-12 DIAGNOSIS — I2510 Atherosclerotic heart disease of native coronary artery without angina pectoris: Secondary | ICD-10-CM | POA: Diagnosis not present

## 2017-06-12 MED ORDER — SPIRONOLACTONE 25 MG PO TABS: 25.0000 mg | ORAL_TABLET | Freq: Every day | ORAL | 3 refills | Status: DC

## 2017-06-12 NOTE — Patient Instructions (Addendum)
Medication Instructions:  Your physician recommends that you continue on your current medications as directed. Please refer to the Current Medication list given to you today.   Labwork: Your physician recommends that you return for lab work today: BMP/CBC.   Testing/Procedures: Your physician has recommended that you have a pacemaker inserted. A pacemaker is a small device that is placed under the skin of your chest or abdomen to help control abnormal heart rhythms. This device uses electrical pulses to prompt the heart to beat at a normal rate. Pacemakers are used to treat heart rhythms that are too slow. Wire (leads) are attached to the pacemaker that goes into the chambers of you heart. This is done in the hospital and usually requires and overnight stay. Please see the instruction sheet given to you today for more information.----06/15/17  Please arrive at [facility] at 12:30 [place] to have a light breakfast morning of procedure. Do not take any medications the morning of the procedure. Hold Plavix starting today. Use scrub according to directions Plan for one night stay Will need someone to drive you home at discharge      Follow-Up: Your physician recommends that you schedule a follow-up appointment in: 10-14 days in device clinic for wound check and 3 months with Dr. Rayann Heman.   Any Other Special Instructions Will Be Listed Below (If Applicable).     If you need a refill on your cardiac medications before your next appointment, please call your pharmacy.

## 2017-06-12 NOTE — Progress Notes (Signed)
Electrophysiology Office Note   Date:  06/12/2017   ID:  NIKITAS DAVTYAN, DOB [birth year], MRN [id number]  PCP:  Chipper Herb, MD  Cardiologist:  Dr Tamala Julian Primary Electrophysiologist: [name], MD    Chief Complaint  Patient presents with  . Bradycardia     History of Present Illness: Joshua [name] is a 83 y.o. male who presents today for electrophysiology evaluation.   The patient is referred by Dr Tamala Julian for symptomatic second degree AV block.   He had PCI 2/18.  He was noted to have second degree AV block at that time.  He was felt to be asymptomatic and no further workup was planned.  Per report, most of his bradycardia was nocturnal at that time. He recently presented to Dr Tamala Julian for follow-up and was noted to have mobitz I second degree AV block during the day.  He has fatigue and SOB.  Denies presyncope or syncope.  holter monitor was placed and revealed frequent daytime AV block with occasional 2:1 AV block and heart rates that were very slow.  He is therefore referred for EP evaluation.  Today, he denies symptoms of palpitations, chest pain, orthopnea, PND, lower extremity edema, claudication, dizziness, presyncope, syncope, bleeding, or neurologic sequela. The patient is tolerating medications without difficulties and is otherwise without complaint today.    Past Medical History:  Diagnosis Date  . 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 Arthritis   . Bilateral lower extremity edema   . Bladder tumor   . CAD (coronary artery disease) cardiologist--  dr Daneen Schick   a. 04/27/2016 PCI with DES to RCA with 50% ostial to 60% segmental mid to distal left main, and 90% thrombus filled ostial to proximal circumflex, with distal right coronary filled by collaterals from left-to-right  . COPD with emphysema ([place])   . Dyspnea on exertion   . ED (erectile dysfunction)   . First degree AV block   . GERD (gastroesophageal  reflux disease)   . History of adenomatous polyp of colon    09-22-2008  . History of bladder cancer    s/p  resection bladder tumor 04-19-2016  non-invasive low-grade urothelial carcinoma  . History of prostate cancer urologist-  dr Jeffie Pollock-- last PSA 0.02 (summer 2017)   dx 02/ 2013-- Stage T2a, Gleason 7, PSA 1.58--  s/p  radioactive prostate seed implants 01-19-2012  . History of ST elevation myocardial infarction (STEMI)    04-27-2016  . Hyperlipidemia   . Hypertension   . S/P drug eluting coronary stent placement    04-27-2016   . Second degree Mobitz I AV block    occasional w/ first degree heart block  per cardiologist note by dr Daneen Schick  . Seizures ([place]) per pt son -- no seizure's since 1980   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  arteriorvenous cerebral malformation s/p  craniotomy 1980  . Vitamin D deficiency    Past Surgical History:  Procedure Laterality Date  . APPENDECTOMY  2011  . BLEPHAROPLASTY Bilateral revision 02-17-2016  . CARDIAC CATHETERIZATION N/A 04/27/2016   Procedure: Left Heart Cath and Coronary Angiography;  Surgeon: Belva Crome, MD;  Location: [place] [facility];  Service: Cardiovascular;  Laterality: N/A;  acute inferior wall STEMI ;  ostCx to midCx 90%,  ost1st Mrg to 1st Mrg 50%,  mid to distal RCA 100%,  pRCA 80%,  LVEF 45-50% w/ inferobasal akinesis  .  CARDIAC CATHETERIZATION N/A 04/27/2016   Procedure: Temporary Pacemaker;  Surgeon: Belva Crome, MD;  Location: [place] [facility];  Service: Cardiovascular;  Laterality: N/A;  mobitz 2  second degree HB w/ heart rate 29bpm  . CARDIAC CATHETERIZATION N/A 04/27/2016   Procedure: Coronary Stent Intervention;  Surgeon: Belva Crome, MD;  Location: [place] [facility];  Service: Cardiovascular;  Laterality: N/A; DES x1 to  Proximal RCA;  DES x1 to Mid RCA  . CATARACT EXTRACTION W/ INTRAOCULAR LENS  IMPLANT, BILATERAL  2016  approx.  Marland Kitchen CRANIOTOMY  1980   intracranial bleed from arteriorvenous  malformation (left parietal)  . CYSTOSCOPY  01/19/2012   Procedure: CYSTOSCOPY;  Surgeon: Malka So, MD;  Location: [facility];  Service: Urology;;  no seeds found in bladder  . CYSTOSCOPY W/ RETROGRADES Bilateral 04/19/2016   Procedure: CYSTOSCOPY WITH BILATERAL RETROGRADE PYELOGRAM TRANSURETHRAL RESECTION OF BLADDER TUMOR ;  Surgeon: [name], MD;  Location: [facility];  Service: Urology;  Laterality: Bilateral;  . CYSTOSCOPY WITH BIOPSY N/A 11/10/2016   Procedure: CYSTOSCOPY WITH BIOPSY AND FULGURATION;  Surgeon: [name], MD;  Location: [facility];  Service: Urology;  Laterality: N/A;  . RADIOACTIVE SEED IMPLANT  01/19/2012   Procedure: RADIOACTIVE SEED IMPLANT;  Surgeon: Malka So, MD;  Location: [facility];  Service: Urology;  Laterality: N/A;  68 seeds implanted  . TRANSTHORACIC ECHOCARDIOGRAM  06/02/2016   ef 60-65%/  trivial MR/  mild TR     Current Outpatient Prescriptions  Medication Sig Dispense Refill  . aspirin EC 81 MG tablet Take 1 tablet (81 mg total) by mouth daily. 90 tablet 3  . atorvastatin (LIPITOR) 20 MG tablet Take 1 tablet (20 mg total) by mouth daily. 90 tablet 3  . Cholecalciferol (VITAMIN D3) 1000 units CAPS Take 1 capsule by mouth daily.    . citalopram (CELEXA) 20 MG tablet Take 1 tablet (20 mg total) by mouth daily. 90 tablet 1  . clopidogrel (PLAVIX) 75 MG tablet Take 1 tablet (75 mg total) by mouth daily. 90 tablet 3  . Multiple Vitamins-Minerals (PRESERVISION AREDS 2) CAPS Take 1 capsule by mouth daily.    Marland Kitchen omeprazole (PRILOSEC) 20 MG capsule Take 20 mg by mouth daily.    Marland Kitchen PHENobarbital (LUMINAL) 64.8 MG tablet Take 1 tablet (64.8 mg total) by mouth 2 (two) times daily. 180 tablet 3  . phenytoin (DILANTIN) 100 MG ER capsule Take 200 mg by mouth 2 (two) times daily.     Marland Kitchen spironolactone (ALDACTONE) 25 MG tablet Take 1 tablet (25 mg total) by mouth daily. 90 tablet 3   No current facility-administered  medications for this visit.     Allergies:   Penicillins   Social History:  The patient  reports that he quit smoking about 44 years ago. He has a 30.00 pack-year smoking history. He has never used smokeless tobacco. He reports that he drinks alcohol. He reports that he does not use drugs.   Family History:  The patient's  family history includes Emphysema in his father and mother; Stroke in his mother.    ROS:  Please see the history of present illness.   All other systems are personally reviewed and negative.    PHYSICAL EXAM: VS:  BP (!) 152/64   Pulse 61   Ht 6' (1.829 m)   Wt 183 lb (83 kg)   SpO2 93%   BMI 24.82 kg/m  , BMI Body mass  index is 24.82 kg/m. GEN: Well nourished, well developed, in no acute distress  HEENT: normal  Neck: no JVD, carotid bruits, or masses Cardiac: RRR; no murmurs, rubs, or gallops,no edema  Respiratory:  clear to auscultation bilaterally, normal work of breathing GI: soft, nontender, nondistended, + BS MS: no deformity or atrophy  Skin: warm and dry  Neuro:  Strength and sensation are intact Psych: euthymic mood, full affect  EKG:  EKG 05/09/17 reveals sinus rhythm with mobitz I second degree AV block, 54 bpm, QRS 90 msec holter 05/30/17 reveals sinus rhythm with frequent daytime mobitz I second degree AV block and occasional 2:1 AV block   Recent Labs: 11/11/2016: NT-Pro BNP 380 04/13/2017: ALT 21; Hemoglobin 13.1; Platelets 275 05/24/2017: BUN 17; Creatinine, Ser 0.88; Potassium 4.3; Sodium 138  personally reviewed   Lipid Panel     Component Value Date/Time   CHOL 164 04/13/2017 1225   TRIG 76 04/13/2017 1225   TRIG 85 01/20/2016 0958   HDL 74 04/13/2017 1225   HDL 69 01/20/2016 0958   CHOLHDL 2.2 04/13/2017 1225   CHOLHDL 3.3 04/27/2016 1133   VLDL 16 04/27/2016 1133   LDLCALC 75 04/13/2017 1225   LDLCALC 99 05/21/2014 1101   personally reviewed   Wt Readings from Last 3 Encounters:  06/12/17 183 lb (83 kg)  05/09/17 182 lb  12.8 oz (82.9 kg)  04/13/17 179 lb (81.2 kg)      Other studies personally reviewed: Additional studies/ records that were reviewed today include: holter monitor, prior echo, Dr Thompson Caul notes  Review of the above records today demonstrates: as above   ASSESSMENT AND PLAN:  1.  Mobitz I second degree AV block The patient has symptomatic bradycardia. No reversible causes are seen.  I would therefore recommend pacemaker implantation at this time.  Risks, benefits, alternatives to pacemaker implantation were discussed in detail with the patient today. The patient understands that the risks include but are not limited to bleeding, infection, pneumothorax, perforation, tamponade, vascular damage, renal failure, MI, stroke, death,  and lead dislodgement and wishes to proceed. We will therefore schedule the procedure at the next available time.  He is interested in BluSync remote monitoring trial and has an iphone.  2. Hypertensive cardiovascular disease Stable No change required today  3. CAD No ischemic symptoms Hold plavix for PPM implant   Current medicines are reviewed at length with the patient today.   The patient does not have concerns regarding his medicines.  The following changes were made today:  none  Labs/ tests ordered today include:  Orders Placed This Encounter  Procedures  . Basic metabolic panel  . CBC with Differential     Signed, [name], MD  06/12/2017 12:41 PM     [place] [address] [phone] (office) [phone] (fax)

## 2017-06-13 LAB — CBC WITH DIFFERENTIAL/PLATELET
Basophils Absolute: 0.1 10*3/uL (ref 0.0–0.2)
Basos: 1 %
EOS (ABSOLUTE): 0.6 10*3/uL — AB (ref 0.0–0.4)
Eos: 6 %
Hematocrit: 36.6 % — ABNORMAL LOW (ref 37.5–51.0)
Hemoglobin: 12.5 g/dL — ABNORMAL LOW (ref 13.0–17.7)
IMMATURE GRANS (ABS): 0.1 10*3/uL (ref 0.0–0.1)
IMMATURE GRANULOCYTES: 1 %
LYMPHS: 27 %
Lymphocytes Absolute: 2.9 10*3/uL (ref 0.7–3.1)
MCH: 29.4 pg (ref 26.6–33.0)
MCHC: 34.2 g/dL (ref 31.5–35.7)
MCV: 86 fL (ref 79–97)
MONOS ABS: 1.4 10*3/uL — AB (ref 0.1–0.9)
Monocytes: 13 %
NEUTROS PCT: 52 %
Neutrophils Absolute: 5.6 10*3/uL (ref 1.4–7.0)
PLATELETS: 316 10*3/uL (ref 150–379)
RBC: 4.25 x10E6/uL (ref 4.14–5.80)
RDW: 16 % — AB (ref 12.3–15.4)
WBC: 10.5 10*3/uL (ref 3.4–10.8)

## 2017-06-13 LAB — BASIC METABOLIC PANEL
BUN/Creatinine Ratio: 25 — ABNORMAL HIGH (ref 10–24)
BUN: 21 mg/dL (ref 8–27)
CALCIUM: 9.4 mg/dL (ref 8.6–10.2)
CHLORIDE: 101 mmol/L (ref 96–106)
CO2: 24 mmol/L (ref 20–29)
Creatinine, Ser: 0.83 mg/dL (ref 0.76–1.27)
GFR calc Af Amer: 94 mL/min/{1.73_m2} (ref >59)
GFR calc non Af Amer: 81 mL/min/{1.73_m2} (ref >59)
GLUCOSE: 63 mg/dL — AB (ref 65–99)
POTASSIUM: 4.5 mmol/L (ref 3.5–5.2)
SODIUM: 140 mmol/L (ref 134–144)

## 2017-06-15 ENCOUNTER — Ambulatory Visit (HOSPITAL_COMMUNITY)
Admission: RE | Admit: 2017-06-15 | Discharge: 2017-06-16 | Disposition: A | Discharge status: Home / Self Care | Payer: Medicare Other | Source: Ambulatory Visit | Attending: Internal Medicine | Admitting: Internal Medicine

## 2017-06-15 ENCOUNTER — Encounter (HOSPITAL_COMMUNITY)
Admission: RE | Disposition: A | Discharge status: Home / Self Care | Payer: Self-pay | Source: Ambulatory Visit | Attending: Internal Medicine

## 2017-06-15 ENCOUNTER — Encounter (HOSPITAL_COMMUNITY): Payer: Self-pay | Admitting: General Practice

## 2017-06-15 DIAGNOSIS — E785 Hyperlipidemia, unspecified: Secondary | ICD-10-CM | POA: Insufficient documentation

## 2017-06-15 DIAGNOSIS — Z8546 Personal history of malignant neoplasm of prostate: Secondary | ICD-10-CM | POA: Insufficient documentation

## 2017-06-15 DIAGNOSIS — Z880 Allergy status to penicillin: Secondary | ICD-10-CM | POA: Insufficient documentation

## 2017-06-15 DIAGNOSIS — J449 Chronic obstructive pulmonary disease, unspecified: Secondary | ICD-10-CM | POA: Insufficient documentation

## 2017-06-15 DIAGNOSIS — Z7902 Long term (current) use of antithrombotics/antiplatelets: Secondary | ICD-10-CM | POA: Diagnosis not present

## 2017-06-15 DIAGNOSIS — Z79899 Other long term (current) drug therapy: Secondary | ICD-10-CM | POA: Diagnosis not present

## 2017-06-15 DIAGNOSIS — I441 Atrioventricular block, second degree: Secondary | ICD-10-CM | POA: Diagnosis present

## 2017-06-15 DIAGNOSIS — I2510 Atherosclerotic heart disease of native coronary artery without angina pectoris: Secondary | ICD-10-CM | POA: Diagnosis not present

## 2017-06-15 DIAGNOSIS — Z8551 Personal history of malignant neoplasm of bladder: Secondary | ICD-10-CM | POA: Insufficient documentation

## 2017-06-15 DIAGNOSIS — Z87891 Personal history of nicotine dependence: Secondary | ICD-10-CM | POA: Diagnosis not present

## 2017-06-15 DIAGNOSIS — E559 Vitamin D deficiency, unspecified: Secondary | ICD-10-CM | POA: Insufficient documentation

## 2017-06-15 DIAGNOSIS — I119 Hypertensive heart disease without heart failure: Secondary | ICD-10-CM | POA: Diagnosis not present

## 2017-06-15 DIAGNOSIS — Z7982 Long term (current) use of aspirin: Secondary | ICD-10-CM | POA: Diagnosis not present

## 2017-06-15 DIAGNOSIS — Z959 Presence of cardiac and vascular implant and graft, unspecified: Secondary | ICD-10-CM

## 2017-06-15 HISTORY — PX: PACEMAKER IMPLANT: EP1218

## 2017-06-15 HISTORY — DX: Presence of cardiac pacemaker: Z95.0

## 2017-06-15 HISTORY — PX: INSERT / REPLACE / REMOVE PACEMAKER: SUR710

## 2017-06-15 LAB — SURGICAL PCR SCREEN
MRSA, PCR: NEGATIVE
Staphylococcus aureus: NEGATIVE

## 2017-06-15 SURGERY — PACEMAKER IMPLANT
Anesthesia: LOCAL

## 2017-06-15 MED ORDER — IOPAMIDOL (ISOVUE-370) INJECTION 76%
INTRAVENOUS | Status: AC
  Filled 2017-06-15: qty 50

## 2017-06-15 MED ORDER — PHENYTOIN SODIUM EXTENDED 100 MG PO CAPS
200.0000 mg | ORAL_CAPSULE | Freq: Two times a day (BID) | ORAL | Status: DC
  Administered 2017-06-15 – 2017-06-16 (×2): 200 mg via ORAL
  Filled 2017-06-15 (×2): qty 2

## 2017-06-15 MED ORDER — CHLORHEXIDINE GLUCONATE 4 % EX LIQD: 60.0000 mL | Freq: Once | CUTANEOUS | Status: DC

## 2017-06-15 MED ORDER — PHENOBARBITAL 32.4 MG PO TABS
64.8000 mg | ORAL_TABLET | Freq: Two times a day (BID) | ORAL | Status: DC
  Administered 2017-06-15 – 2017-06-16 (×2): 64.8 mg via ORAL
  Filled 2017-06-15 (×2): qty 2

## 2017-06-15 MED ORDER — ACETAMINOPHEN 325 MG PO TABS: 325.0000 mg | ORAL_TABLET | ORAL | Status: DC | PRN

## 2017-06-15 MED ORDER — HEPARIN (PORCINE) IN NACL 2-0.9 UNIT/ML-% IJ SOLN
INTRAMUSCULAR | Status: AC
  Filled 2017-06-15: qty 500

## 2017-06-15 MED ORDER — SODIUM CHLORIDE 0.9 % IR SOLN
Status: AC
  Filled 2017-06-15: qty 2

## 2017-06-15 MED ORDER — LIDOCAINE-EPINEPHRINE 1 %-1:100000 IJ SOLN
INTRAMUSCULAR | Status: AC
  Filled 2017-06-15: qty 1

## 2017-06-15 MED ORDER — SODIUM CHLORIDE 0.9 % IR SOLN
80.0000 mg | Status: AC
  Administered 2017-06-15: 80 mg
  Filled 2017-06-15: qty 2

## 2017-06-15 MED ORDER — SODIUM CHLORIDE 0.9% FLUSH: 3.0000 mL | INTRAVENOUS | Status: DC | PRN

## 2017-06-15 MED ORDER — CITALOPRAM HYDROBROMIDE 10 MG PO TABS
10.0000 mg | ORAL_TABLET | Freq: Every day | ORAL | Status: DC
Start: 2017-06-16 — End: 2017-06-16
  Administered 2017-06-16: 10 mg via ORAL
  Filled 2017-06-15: qty 1

## 2017-06-15 MED ORDER — IOPAMIDOL (ISOVUE-370) INJECTION 76%
INTRAVENOUS | Status: DC | PRN
  Administered 2017-06-15: 15 mL via INTRAVENOUS

## 2017-06-15 MED ORDER — HEPARIN (PORCINE) IN NACL 2-0.9 UNIT/ML-% IJ SOLN
INTRAMUSCULAR | Status: AC | PRN
  Administered 2017-06-15: 500 mL

## 2017-06-15 MED ORDER — SODIUM CHLORIDE 0.9 % IV SOLN
INTRAVENOUS | Status: DC
  Administered 2017-06-15: 14:00:00 via INTRAVENOUS

## 2017-06-15 MED ORDER — SPIRONOLACTONE 25 MG PO TABS
25.0000 mg | ORAL_TABLET | Freq: Every day | ORAL | Status: DC
  Administered 2017-06-16: 25 mg via ORAL
  Filled 2017-06-15: qty 1

## 2017-06-15 MED ORDER — SODIUM CHLORIDE 0.9% FLUSH
3.0000 mL | Freq: Two times a day (BID) | INTRAVENOUS | Status: DC
  Administered 2017-06-15: 3 mL via INTRAVENOUS

## 2017-06-15 MED ORDER — HYDROCODONE-ACETAMINOPHEN 5-325 MG PO TABS: 1.0000 | ORAL_TABLET | ORAL | Status: DC | PRN

## 2017-06-15 MED ORDER — MUPIROCIN 2 % EX OINT
TOPICAL_OINTMENT | CUTANEOUS | Status: AC
  Administered 2017-06-15: 1 via TOPICAL
  Filled 2017-06-15: qty 22

## 2017-06-15 MED ORDER — VANCOMYCIN HCL IN DEXTROSE 1-5 GM/200ML-% IV SOLN
INTRAVENOUS | Status: AC
  Filled 2017-06-15: qty 200

## 2017-06-15 MED ORDER — ONDANSETRON HCL 4 MG/2ML IJ SOLN: 4.0000 mg | Freq: Four times a day (QID) | INTRAMUSCULAR | Status: DC | PRN

## 2017-06-15 MED ORDER — SODIUM CHLORIDE 0.9 % IV SOLN: 250.0000 mL | INTRAVENOUS | Status: DC | PRN

## 2017-06-15 MED ORDER — ASPIRIN EC 81 MG PO TBEC
81.0000 mg | DELAYED_RELEASE_TABLET | Freq: Every day | ORAL | Status: DC
Start: 2017-06-16 — End: 2017-06-16
  Administered 2017-06-16: 81 mg via ORAL
  Filled 2017-06-15: qty 1

## 2017-06-15 MED ORDER — VANCOMYCIN HCL IN DEXTROSE 1-5 GM/200ML-% IV SOLN
1000.0000 mg | Freq: Two times a day (BID) | INTRAVENOUS | Status: AC
  Administered 2017-06-16: 1000 mg via INTRAVENOUS
  Filled 2017-06-15: qty 200

## 2017-06-15 MED ORDER — VANCOMYCIN HCL IN DEXTROSE 1-5 GM/200ML-% IV SOLN
1000.0000 mg | INTRAVENOUS | Status: AC
  Administered 2017-06-15: 1000 mg via INTRAVENOUS

## 2017-06-15 MED ORDER — MUPIROCIN 2 % EX OINT
1.0000 "application " | TOPICAL_OINTMENT | Freq: Once | CUTANEOUS | Status: AC
  Administered 2017-06-15: 1 via TOPICAL

## 2017-06-15 MED ORDER — LIDOCAINE-EPINEPHRINE 1 %-1:100000 IJ SOLN
INTRAMUSCULAR | Status: DC | PRN
  Administered 2017-06-15: 40 mL

## 2017-06-15 SURGICAL SUPPLY — 10 items
CABLE SURGICAL S-101-97-12 (CABLE) ×2 IMPLANT
CATH RIGHTSITE C315HIS02 (CATHETERS) ×2 IMPLANT
IPG PACE AZUR XT DR MRI W1DR01 (Pacemaker) ×1 IMPLANT
LEAD CAPSURE NOVUS 5076-52CM (Lead) ×2 IMPLANT
LEAD CAPSURE NOVUS 5076-58CM (Lead) ×2 IMPLANT
PACE AZURE XT DR MRI W1DR01 (Pacemaker) ×2 IMPLANT
PAD DEFIB LIFELINK (PAD) ×2 IMPLANT
SHEATH CLASSIC 7F (SHEATH) ×4 IMPLANT
TRAY PACEMAKER INSERTION (PACKS) ×2 IMPLANT
WIRE HI TORQ VERSACORE-J 145CM (WIRE) ×2 IMPLANT

## 2017-06-15 NOTE — H&P (View-Only) (Signed)
Electrophysiology Office Note   Date:  06/12/2017   ID:  Joshua [name], DOB 26-Jan-1932, MRN [id number]  PCP:  Chipper Herb, MD  Cardiologist:  Dr Tamala Julian Primary Electrophysiologist: [name], MD    Chief Complaint  Patient presents with  . Bradycardia     History of Present Illness: Joshua [name] is a 83 y.o. male who presents today for electrophysiology evaluation.   The patient is referred by Dr Tamala Julian for symptomatic second degree AV block.   He had PCI 2/18.  He was noted to have second degree AV block at that time.  He was felt to be asymptomatic and no further workup was planned.  Per report, most of his bradycardia was nocturnal at that time. He recently presented to Dr Tamala Julian for follow-up and was noted to have mobitz I second degree AV block during the day.  He has fatigue and SOB.  Denies presyncope or syncope.  holter monitor was placed and revealed frequent daytime AV block with occasional 2:1 AV block and heart rates that were very slow.  He is therefore referred for EP evaluation.  Today, he denies symptoms of palpitations, chest pain, orthopnea, PND, lower extremity edema, claudication, dizziness, presyncope, syncope, bleeding, or neurologic sequela. The patient is tolerating medications without difficulties and is otherwise without complaint today.    Past Medical History:  Diagnosis Date  . 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 Arthritis   . Bilateral lower extremity edema   . Bladder tumor   . CAD (coronary artery disease) cardiologist--  dr Daneen Schick   a. 04/27/2016 PCI with DES to RCA with 50% ostial to 60% segmental mid to distal left main, and 90% thrombus filled ostial to proximal circumflex, with distal right coronary filled by collaterals from left-to-right  . COPD with emphysema ([place])   . Dyspnea on exertion   . ED (erectile dysfunction)   . First degree AV block   . GERD (gastroesophageal  reflux disease)   . History of adenomatous polyp of colon    09-22-2008  . History of bladder cancer    s/p  resection bladder tumor 04-19-2016  non-invasive low-grade urothelial carcinoma  . History of prostate cancer urologist-  dr Jeffie Pollock-- last PSA 0.02 (summer 2017)   dx 02/ 2013-- Stage T2a, Gleason 7, PSA 1.58--  s/p  radioactive prostate seed implants 01-19-2012  . History of ST elevation myocardial infarction (STEMI)    04-27-2016  . Hyperlipidemia   . Hypertension   . S/P drug eluting coronary stent placement    04-27-2016   . Second degree Mobitz I AV block    occasional w/ first degree heart block  per cardiologist note by dr Daneen Schick  . Seizures ([place]) per pt son -- no seizure's since 1980   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  arteriorvenous cerebral malformation s/p  craniotomy 1980  . Vitamin D deficiency    Past Surgical History:  Procedure Laterality Date  . APPENDECTOMY  2011  . BLEPHAROPLASTY Bilateral revision 02-17-2016  . CARDIAC CATHETERIZATION N/A 04/27/2016   Procedure: Left Heart Cath and Coronary Angiography;  Surgeon: Belva Crome, MD;  Location: [place] [facility];  Service: Cardiovascular;  Laterality: N/A;  acute inferior wall STEMI ;  ostCx to midCx 90%,  ost1st Mrg to 1st Mrg 50%,  mid to distal RCA 100%,  pRCA 80%,  LVEF 45-50% w/ inferobasal akinesis  .  CARDIAC CATHETERIZATION N/A 04/27/2016   Procedure: Temporary Pacemaker;  Surgeon: Belva Crome, MD;  Location: [place] [facility];  Service: Cardiovascular;  Laterality: N/A;  mobitz 2  second degree HB w/ heart rate 29bpm  . CARDIAC CATHETERIZATION N/A 04/27/2016   Procedure: Coronary Stent Intervention;  Surgeon: Belva Crome, MD;  Location: [place] [facility];  Service: Cardiovascular;  Laterality: N/A; DES x1 to  Proximal RCA;  DES x1 to Mid RCA  . CATARACT EXTRACTION W/ INTRAOCULAR LENS  IMPLANT, BILATERAL  2016  approx.  Marland Kitchen CRANIOTOMY  1980   intracranial bleed from arteriorvenous  malformation (left parietal)  . CYSTOSCOPY  01/19/2012   Procedure: CYSTOSCOPY;  Surgeon: Malka So, MD;  Location: [facility];  Service: Urology;;  no seeds found in bladder  . CYSTOSCOPY W/ RETROGRADES Bilateral 04/19/2016   Procedure: CYSTOSCOPY WITH BILATERAL RETROGRADE PYELOGRAM TRANSURETHRAL RESECTION OF BLADDER TUMOR ;  Surgeon: [name], MD;  Location: [facility];  Service: Urology;  Laterality: Bilateral;  . CYSTOSCOPY WITH BIOPSY N/A 11/10/2016   Procedure: CYSTOSCOPY WITH BIOPSY AND FULGURATION;  Surgeon: [name], MD;  Location: [facility];  Service: Urology;  Laterality: N/A;  . RADIOACTIVE SEED IMPLANT  01/19/2012   Procedure: RADIOACTIVE SEED IMPLANT;  Surgeon: Malka So, MD;  Location: [facility];  Service: Urology;  Laterality: N/A;  68 seeds implanted  . TRANSTHORACIC ECHOCARDIOGRAM  06/02/2016   ef 60-65%/  trivial MR/  mild TR     Current Outpatient Prescriptions  Medication Sig Dispense Refill  . aspirin EC 81 MG tablet Take 1 tablet (81 mg total) by mouth daily. 90 tablet 3  . atorvastatin (LIPITOR) 20 MG tablet Take 1 tablet (20 mg total) by mouth daily. 90 tablet 3  . Cholecalciferol (VITAMIN D3) 1000 units CAPS Take 1 capsule by mouth daily.    . citalopram (CELEXA) 20 MG tablet Take 1 tablet (20 mg total) by mouth daily. 90 tablet 1  . clopidogrel (PLAVIX) 75 MG tablet Take 1 tablet (75 mg total) by mouth daily. 90 tablet 3  . Multiple Vitamins-Minerals (PRESERVISION AREDS 2) CAPS Take 1 capsule by mouth daily.    Marland Kitchen omeprazole (PRILOSEC) 20 MG capsule Take 20 mg by mouth daily.    Marland Kitchen PHENobarbital (LUMINAL) 64.8 MG tablet Take 1 tablet (64.8 mg total) by mouth 2 (two) times daily. 180 tablet 3  . phenytoin (DILANTIN) 100 MG ER capsule Take 200 mg by mouth 2 (two) times daily.     Marland Kitchen spironolactone (ALDACTONE) 25 MG tablet Take 1 tablet (25 mg total) by mouth daily. 90 tablet 3   No current facility-administered  medications for this visit.     Allergies:   Penicillins   Social History:  The patient  reports that he quit smoking about 44 years ago. He has a 30.00 pack-year smoking history. He has never used smokeless tobacco. He reports that he drinks alcohol. He reports that he does not use drugs.   Family History:  The patient's  family history includes Emphysema in his father and mother; Stroke in his mother.    ROS:  Please see the history of present illness.   All other systems are personally reviewed and negative.    PHYSICAL EXAM: VS:  BP (!) 152/64   Pulse 61   Ht 6' (1.829 m)   Wt 183 lb (83 kg)   SpO2 93%   BMI 24.82 kg/m  , BMI Body mass  index is 24.82 kg/m. GEN: Well nourished, well developed, in no acute distress  HEENT: normal  Neck: no JVD, carotid bruits, or masses Cardiac: RRR; no murmurs, rubs, or gallops,no edema  Respiratory:  clear to auscultation bilaterally, normal work of breathing GI: soft, nontender, nondistended, + BS MS: no deformity or atrophy  Skin: warm and dry  Neuro:  Strength and sensation are intact Psych: euthymic mood, full affect  EKG:  EKG 05/09/17 reveals sinus rhythm with mobitz I second degree AV block, 54 bpm, QRS 90 msec holter 05/30/17 reveals sinus rhythm with frequent daytime mobitz I second degree AV block and occasional 2:1 AV block   Recent Labs: 11/11/2016: NT-Pro BNP 380 04/13/2017: ALT 21; Hemoglobin 13.1; Platelets 275 05/24/2017: BUN 17; Creatinine, Ser 0.88; Potassium 4.3; Sodium 138  personally reviewed   Lipid Panel     Component Value Date/Time   CHOL 164 04/13/2017 1225   TRIG 76 04/13/2017 1225   TRIG 85 01/20/2016 0958   HDL 74 04/13/2017 1225   HDL 69 01/20/2016 0958   CHOLHDL 2.2 04/13/2017 1225   CHOLHDL 3.3 04/27/2016 1133   VLDL 16 04/27/2016 1133   LDLCALC 75 04/13/2017 1225   LDLCALC 99 05/21/2014 1101   personally reviewed   Wt Readings from Last 3 Encounters:  06/12/17 183 lb (83 kg)  05/09/17 182 lb  12.8 oz (82.9 kg)  04/13/17 179 lb (81.2 kg)      Other studies personally reviewed: Additional studies/ records that were reviewed today include: holter monitor, prior echo, Dr Thompson Caul notes  Review of the above records today demonstrates: as above   ASSESSMENT AND PLAN:  1.  Mobitz I second degree AV block The patient has symptomatic bradycardia. No reversible causes are seen.  I would therefore recommend pacemaker implantation at this time.  Risks, benefits, alternatives to pacemaker implantation were discussed in detail with the patient today. The patient understands that the risks include but are not limited to bleeding, infection, pneumothorax, perforation, tamponade, vascular damage, renal failure, MI, stroke, death,  and lead dislodgement and wishes to proceed. We will therefore schedule the procedure at the next available time.  He is interested in BluSync remote monitoring trial and has an iphone.  2. Hypertensive cardiovascular disease Stable No change required today  3. CAD No ischemic symptoms Hold plavix for PPM implant   Current medicines are reviewed at length with the patient today.   The patient does not have concerns regarding his medicines.  The following changes were made today:  none  Labs/ tests ordered today include:  Orders Placed This Encounter  Procedures  . Basic metabolic panel  . CBC with Differential     Signed, [name], MD  06/12/2017 12:41 PM     [place] [address] [phone] (office) [phone] (fax)

## 2017-06-15 NOTE — Interval H&P Note (Signed)
History and Physical Interval Note:  06/15/2017 2:30 PM  Joshua [name]  has presented today for surgery, with the diagnosis of heart block  The various methods of treatment have been discussed with the patient and family. After consideration of risks, benefits and other options for treatment, the patient has consented to  Procedure(s): Pacemaker Implant (N/A) as a surgical intervention .  The patient's history has been reviewed, patient examined, no change in status, stable for surgery.  I have reviewed the patient's chart and labs.  Questions were answered to the patient's satisfaction.     Joshua [name]

## 2017-06-15 NOTE — Progress Notes (Signed)
Orthopedic Tech Progress Note Patient Details:  Joshua [name] 20-Aug-1932 [id number] Patient has sling. Patient ID: [name], male   DOB: 01-10-1932, 83 y.o.   MRN: [id number]   Braulio Bosch 06/15/2017, 6:44 PM

## 2017-06-15 NOTE — Discharge Summary (Signed)
ELECTROPHYSIOLOGY PROCEDURE DISCHARGE SUMMARY    Patient ID: Joshua [name],  MRN: [id number], DOB/AGE: 26-Jun-1932 83 y.o.  Admit date: 06/15/2017 Discharge date: 06/16/17   Primary Care Physician: Chipper Herb, MD  Primary Cardiologist: Dr. Linard Millers Electrophysiologist: Dr. Rayann Heman  Primary Discharge Diagnosis:  1. Second degree AV block  Secondary Discharge Diagnosis:  1. CAD 2. COPD 3. HTN 4. HLD  Allergies  Allergen Reactions  . Penicillins Rash    Has patient had a PCN reaction causing immediate rash, facial/tongue/throat swelling, SOB or lightheadedness with hypotension: Yes Has patient had a PCN reaction causing severe rash involving mucus membranes or skin necrosis: No Has patient had a PCN reaction that required hospitalization No Has patient had a PCN reaction occurring within the last 10 years: No If all of the above answers are "NO", then may proceed with Cephalosporin use.      Procedures This Admission:  1.  Implantation of a MDT Azure XT MRI conditional dual2 chamber PPM on 06/15/17 by Dr Ky Barban.  There were no immediate post procedure complications. 2.  CXR on 06/16/17 demonstrated no pneumothorax status post device implantation.   Brief HPI: Joshua [name] is a 83 y.o. male was referred to electrophysiology in the outpatient setting for consideration of PPM implantation.  Past medical history includes as above.  The patient has had second degree AVBlock with symptomatic bradycardia 2;1 conduction.  Risks, benefits, and alternatives to PPM implantation were reviewed with the patient who wished to proceed.   Hospital Course:  The patient was admitted and underwent implantation of a PPM with details as outlined above.  He was monitored on telemetry overnight which demonstrated sinus rhythm with V pacing.  Left chest was without hematoma or ecchymosis.  The device was interrogated and found to be functioning normally.  CXR was obtained and demonstrated no  pneumothorax status post device implantation.  Wound care, arm mobility, and restrictions were reviewed with the patient.  The patient was examined by Dr. Rayann Heman  and considered stable for discharge to home.    Physical Exam: Vitals:   06/15/17 1820 06/15/17 1847 06/16/17 0000 06/16/17 0535  BP: (!) 166/62 134/68 (!) 117/58 130/61  Pulse: 60 64 (!) 59 60  Resp: (!) 21  18 18   Temp:   98.4 F (36.9 C) 98.3 F (36.8 C)  TempSrc:   Oral Oral  SpO2: 95% 95% 96% 94%  Weight:    175 lb 3.2 oz (79.5 kg)  Height:    6' (1.829 m)    GEN- The patient is well appearing, alert and oriented x 3 today.   HEENT: normocephalic, atraumatic; sclera clear, conjunctiva pink; hearing intact; oropharynx clear; neck supple, no JVP Lungs-  CTA b/l, normal work of breathing.  No wheezes, rales, rhonchi Heart-  RRR, no murmurs, rubs or gallops, PMI not laterally displaced GI- soft, non-tender, non-distended Extremities- no clubbing, cyanosis, or edema MS- no significant deformity or atrophy Skin- warm and dry, no rash or lesion,  left chest without hematoma/ecchymosis Psych- euthymic mood, full affect Neuro- no gross deficits   Labs:   Lab Results  Component Value Date   WBC 10.5 06/12/2017   HGB 12.5 (L) 06/12/2017   HCT 36.6 (L) 06/12/2017   MCV 86 06/12/2017   PLT 316 06/12/2017     Recent Labs Lab 06/16/17 0408  NA 138  K 4.0  CL 103  CO2 27  BUN 15  CREATININE 0.85  CALCIUM 9.1  GLUCOSE 82    Discharge Medications:  Allergies as of 06/16/2017      Reactions   Penicillins Rash   Has patient had a PCN reaction causing immediate rash, facial/tongue/throat swelling, SOB or lightheadedness with hypotension: Yes Has patient had a PCN reaction causing severe rash involving mucus membranes or skin necrosis: No Has patient had a PCN reaction that required hospitalization No Has patient had a PCN reaction occurring within the last 10 years: No If all of the above answers are "NO", then  may proceed with Cephalosporin use.      Medication List    TAKE these medications   aspirin EC 81 MG tablet Take 1 tablet (81 mg total) by mouth daily.   atorvastatin 20 MG tablet Commonly known as:  LIPITOR Take 1 tablet (20 mg total) by mouth daily.   calcium carbonate 1500 (600 Ca) MG Tabs tablet Commonly known as:  OSCAL Take 600 mg of elemental calcium by mouth daily with breakfast.   citalopram 10 MG tablet Commonly known as:  CELEXA Take 10 mg by mouth daily.   clopidogrel 75 MG tablet Commonly known as:  PLAVIX Take 1 tablet (75 mg total) by mouth daily.   PHENobarbital 64.8 MG tablet Commonly known as:  LUMINAL Take 1 tablet (64.8 mg total) by mouth 2 (two) times daily.   phenytoin 100 MG ER capsule Commonly known as:  DILANTIN Take 200 mg by mouth 2 (two) times daily.   PRESERVISION AREDS 2 Caps Take 1 capsule by mouth daily.   spironolactone 25 MG tablet Commonly known as:  ALDACTONE Take 1 tablet (25 mg total) by mouth daily.   Vitamin D3 1000 units Caps Take 1 capsule by mouth daily.       Disposition: Home   Follow-up Information    [place] [facility] Follow up on 06/29/2017.   Specialty:  Cardiology Why:  11:30AM, wound check visit Contact information: [address] [place]       [name], MD Follow up on 09/18/2017.   Specialty:  Cardiology Why:  12:15PM Contact information: [place] [address] [phone]           Duration of Discharge Encounter: Greater than 30 minutes including physician time.  Army Fossa MD 06/16/2017 6:46 AM

## 2017-06-16 ENCOUNTER — Ambulatory Visit (HOSPITAL_COMMUNITY): Payer: Medicare Other

## 2017-06-16 ENCOUNTER — Encounter (HOSPITAL_COMMUNITY): Payer: Self-pay

## 2017-06-16 DIAGNOSIS — E785 Hyperlipidemia, unspecified: Secondary | ICD-10-CM | POA: Diagnosis not present

## 2017-06-16 DIAGNOSIS — I441 Atrioventricular block, second degree: Secondary | ICD-10-CM | POA: Diagnosis not present

## 2017-06-16 DIAGNOSIS — Z8546 Personal history of malignant neoplasm of prostate: Secondary | ICD-10-CM | POA: Diagnosis not present

## 2017-06-16 DIAGNOSIS — Z880 Allergy status to penicillin: Secondary | ICD-10-CM | POA: Diagnosis not present

## 2017-06-16 DIAGNOSIS — Z8551 Personal history of malignant neoplasm of bladder: Secondary | ICD-10-CM | POA: Diagnosis not present

## 2017-06-16 DIAGNOSIS — Z7982 Long term (current) use of aspirin: Secondary | ICD-10-CM | POA: Diagnosis not present

## 2017-06-16 DIAGNOSIS — I2510 Atherosclerotic heart disease of native coronary artery without angina pectoris: Secondary | ICD-10-CM | POA: Diagnosis not present

## 2017-06-16 DIAGNOSIS — E559 Vitamin D deficiency, unspecified: Secondary | ICD-10-CM | POA: Diagnosis not present

## 2017-06-16 DIAGNOSIS — I119 Hypertensive heart disease without heart failure: Secondary | ICD-10-CM | POA: Diagnosis not present

## 2017-06-16 DIAGNOSIS — Z7902 Long term (current) use of antithrombotics/antiplatelets: Secondary | ICD-10-CM | POA: Diagnosis not present

## 2017-06-16 DIAGNOSIS — J449 Chronic obstructive pulmonary disease, unspecified: Secondary | ICD-10-CM | POA: Diagnosis not present

## 2017-06-16 DIAGNOSIS — Z79899 Other long term (current) drug therapy: Secondary | ICD-10-CM | POA: Diagnosis not present

## 2017-06-16 DIAGNOSIS — Z950 Presence of cardiac pacemaker: Secondary | ICD-10-CM | POA: Diagnosis not present

## 2017-06-16 LAB — BASIC METABOLIC PANEL
ANION GAP: 8 (ref 5–15)
BUN: 15 mg/dL (ref 6–20)
CALCIUM: 9.1 mg/dL (ref 8.9–10.3)
CO2: 27 mmol/L (ref 22–32)
Chloride: 103 mmol/L (ref 101–111)
Creatinine, Ser: 0.85 mg/dL (ref 0.61–1.24)
GFR calc Af Amer: >60 mL/min (ref >60)
GFR calc non Af Amer: >60 mL/min (ref >60)
GLUCOSE: 82 mg/dL (ref 65–99)
Potassium: 4 mmol/L (ref 3.5–5.1)
Sodium: 138 mmol/L (ref 135–145)

## 2017-06-16 MED ORDER — INFLUENZA VAC SPLIT HIGH-DOSE 0.5 ML IM SUSY: 0.5000 mL | PREFILLED_SYRINGE | INTRAMUSCULAR | Status: DC

## 2017-06-16 NOTE — Progress Notes (Signed)
Discharge instructions, RX's and follow up appts explained and provided to patient and daughter verbalized understanding. Patient left floor via wheelchair accompanied by staff no c/o pain or shortness of breath at d/c.   Jaymee Tilson, Tivis Ringer, RN

## 2017-06-16 NOTE — Discharge Instructions (Addendum)
° ° °  Supplemental Discharge Instructions for  Pacemaker  Patients  Activity No heavy lifting or vigorous activity with your left/right arm for 6 to 8 weeks.  Do not raise your left/right arm above your head for one week.  Gradually raise your affected arm as drawn below.             05/20/17                       05/21/17                      05/22/17                     05/23/17 __  NO DRIVING for  1 week ; you may begin driving on  10/25/69   .  WOUND CARE - Keep the wound area clean and dry.  Do not get this area wet, no showers until cleared to at your wound check visit  . - The tape/steri-strips on your wound will fall off; do not pull them off.  No bandage is needed on the site.  DO  NOT apply any creams, oils, or ointments to the wound area. - If you notice any drainage or discharge from the wound, any swelling or bruising at the site, or you develop a fever > 101? F after you are discharged home, call the office at once.  Special Instructions - You are still able to use cellular telephones; use the ear opposite the side where you have your pacemaker/defibrillator.  Avoid carrying your cellular phone near your device. - When traveling through airports, show security personnel your identification card to avoid being screened in the metal detectors.  Ask the security personnel to use the hand wand. - Avoid arc welding equipment, TENS units (transcutaneous nerve stimulators).  Call the office for questions about other devices. - Avoid electrical appliances that are in poor condition or are not properly grounded. - Microwave ovens are safe to be near or to operate.

## 2017-06-22 ENCOUNTER — Telehealth: Payer: Self-pay | Admitting: Internal Medicine

## 2017-06-22 ENCOUNTER — Ambulatory Visit (INDEPENDENT_AMBULATORY_CARE_PROVIDER_SITE_OTHER): Payer: Medicare Other | Admitting: *Deleted

## 2017-06-22 DIAGNOSIS — I441 Atrioventricular block, second degree: Secondary | ICD-10-CM

## 2017-06-22 NOTE — Telephone Encounter (Signed)
New message      1. Has your device fired? no  2. Is you device beeping? no  3. Are you experiencing draining or swelling at device site? Swelling with a blood pocket underneath it , about the size of a baseball ,   4. Are you calling to see if we received your device transmission? no  5. Have you passed out?  no    Please route to [place]

## 2017-06-22 NOTE — Telephone Encounter (Signed)
Spoke with Joshua [name]. appointment made for 4:00pm for wound check.

## 2017-06-22 NOTE — Progress Notes (Signed)
     Pt seen for c/o of swelling at wound site small hematoma noted, site assessed by JA no recommendations. ROV 06/29/2017 for wound re-check.

## 2017-06-28 DIAGNOSIS — Z8546 Personal history of malignant neoplasm of prostate: Secondary | ICD-10-CM | POA: Diagnosis not present

## 2017-06-28 DIAGNOSIS — Z8551 Personal history of malignant neoplasm of bladder: Secondary | ICD-10-CM | POA: Diagnosis not present

## 2017-06-29 ENCOUNTER — Encounter: Payer: Medicare Other | Admitting: *Deleted

## 2017-06-29 ENCOUNTER — Ambulatory Visit (INDEPENDENT_AMBULATORY_CARE_PROVIDER_SITE_OTHER): Payer: Medicare Other | Admitting: *Deleted

## 2017-06-29 ENCOUNTER — Telehealth: Payer: Self-pay | Admitting: Cardiology

## 2017-06-29 DIAGNOSIS — I441 Atrioventricular block, second degree: Secondary | ICD-10-CM

## 2017-06-29 LAB — CUP PACEART INCLINIC DEVICE CHECK
Battery Remaining Longevity: 130 mo
Battery Voltage: 3.2 V
Brady Statistic AP VS Percent: 0.08 %
Brady Statistic AS VP Percent: 57.92 %
Brady Statistic AS VS Percent: 4.54 %
Brady Statistic RV Percent Paced: 95.38 %
Implantable Lead Implant Date: 20180927
Implantable Lead Implant Date: 20180927
Implantable Lead Location: 753860
Implantable Lead Model: 5076
Lead Channel Impedance Value: 304 Ohm
Lead Channel Impedance Value: 399 Ohm
Lead Channel Pacing Threshold Amplitude: 0.5 V
Lead Channel Pacing Threshold Amplitude: 0.75 V
Lead Channel Pacing Threshold Pulse Width: 0.4 ms
Lead Channel Sensing Intrinsic Amplitude: 2.5 mV
Lead Channel Sensing Intrinsic Amplitude: 2.875 mV
MDC IDC LEAD LOCATION: 753859
MDC IDC MSMT LEADCHNL RA PACING THRESHOLD PULSEWIDTH: 0.4 ms
MDC IDC MSMT LEADCHNL RV IMPEDANCE VALUE: 342 Ohm
MDC IDC MSMT LEADCHNL RV IMPEDANCE VALUE: 456 Ohm
MDC IDC MSMT LEADCHNL RV SENSING INTR AMPL: 10.625 mV
MDC IDC MSMT LEADCHNL RV SENSING INTR AMPL: 9.375 mV
MDC IDC PG IMPLANT DT: 20180927
MDC IDC SESS DTM: 20181011135430
MDC IDC SET LEADCHNL RA PACING AMPLITUDE: 3.5 V
MDC IDC SET LEADCHNL RV PACING AMPLITUDE: 3.5 V
MDC IDC SET LEADCHNL RV PACING PULSEWIDTH: 0.4 ms
MDC IDC SET LEADCHNL RV SENSING SENSITIVITY: 0.9 mV
MDC IDC STAT BRADY AP VP PERCENT: 37.46 %
MDC IDC STAT BRADY RA PERCENT PACED: 37.74 %

## 2017-06-29 NOTE — Telephone Encounter (Signed)
LMOVM reminding pt to send remote transmission.   

## 2017-06-29 NOTE — Progress Notes (Signed)
     Wound check appointment. Steri-strips removed Hematoma noted, healing stages of bruising. Incision edge on right side unapproximated, steri-strips applied. See Epic for pictures of wound.  Normal device function. Thresholds, sensing, and impedances consistent with implant measurements. Device programmed at 3.5V programmed on for extra safety margin until 3 month visit. Histogram distribution appropriate for patient and level of activity. No mode switches or high ventricular rates noted. Patient educated about wound care, arm mobility, lifting restrictions. ROV with device clinic 07/06/17 for wound re-check. ROV 09/18/17 w/ JA.

## 2017-06-30 ENCOUNTER — Encounter: Payer: Self-pay | Admitting: Cardiology

## 2017-06-30 NOTE — Progress Notes (Signed)
Letter  

## 2017-07-06 ENCOUNTER — Ambulatory Visit (INDEPENDENT_AMBULATORY_CARE_PROVIDER_SITE_OTHER): Payer: Self-pay | Admitting: *Deleted

## 2017-07-06 DIAGNOSIS — I441 Atrioventricular block, second degree: Secondary | ICD-10-CM

## 2017-07-06 LAB — CUP PACEART INCLINIC DEVICE CHECK
Date Time Interrogation Session: 20181018150824
Implantable Lead Location: 753859
Implantable Lead Location: 753860
Implantable Lead Model: 5076
MDC IDC LEAD IMPLANT DT: 20180927
MDC IDC LEAD IMPLANT DT: 20180927
MDC IDC PG IMPLANT DT: 20180927

## 2017-07-06 IMAGING — DX DG CHEST 2V
2 series · 2 of 2 positions shown · non-contrast
Comparison: 05/01/2016

CLINICAL DATA: Cardiac arrhythmia.  Fever Syncope.

EXAM:
CHEST  2 VIEW

[chest lat]
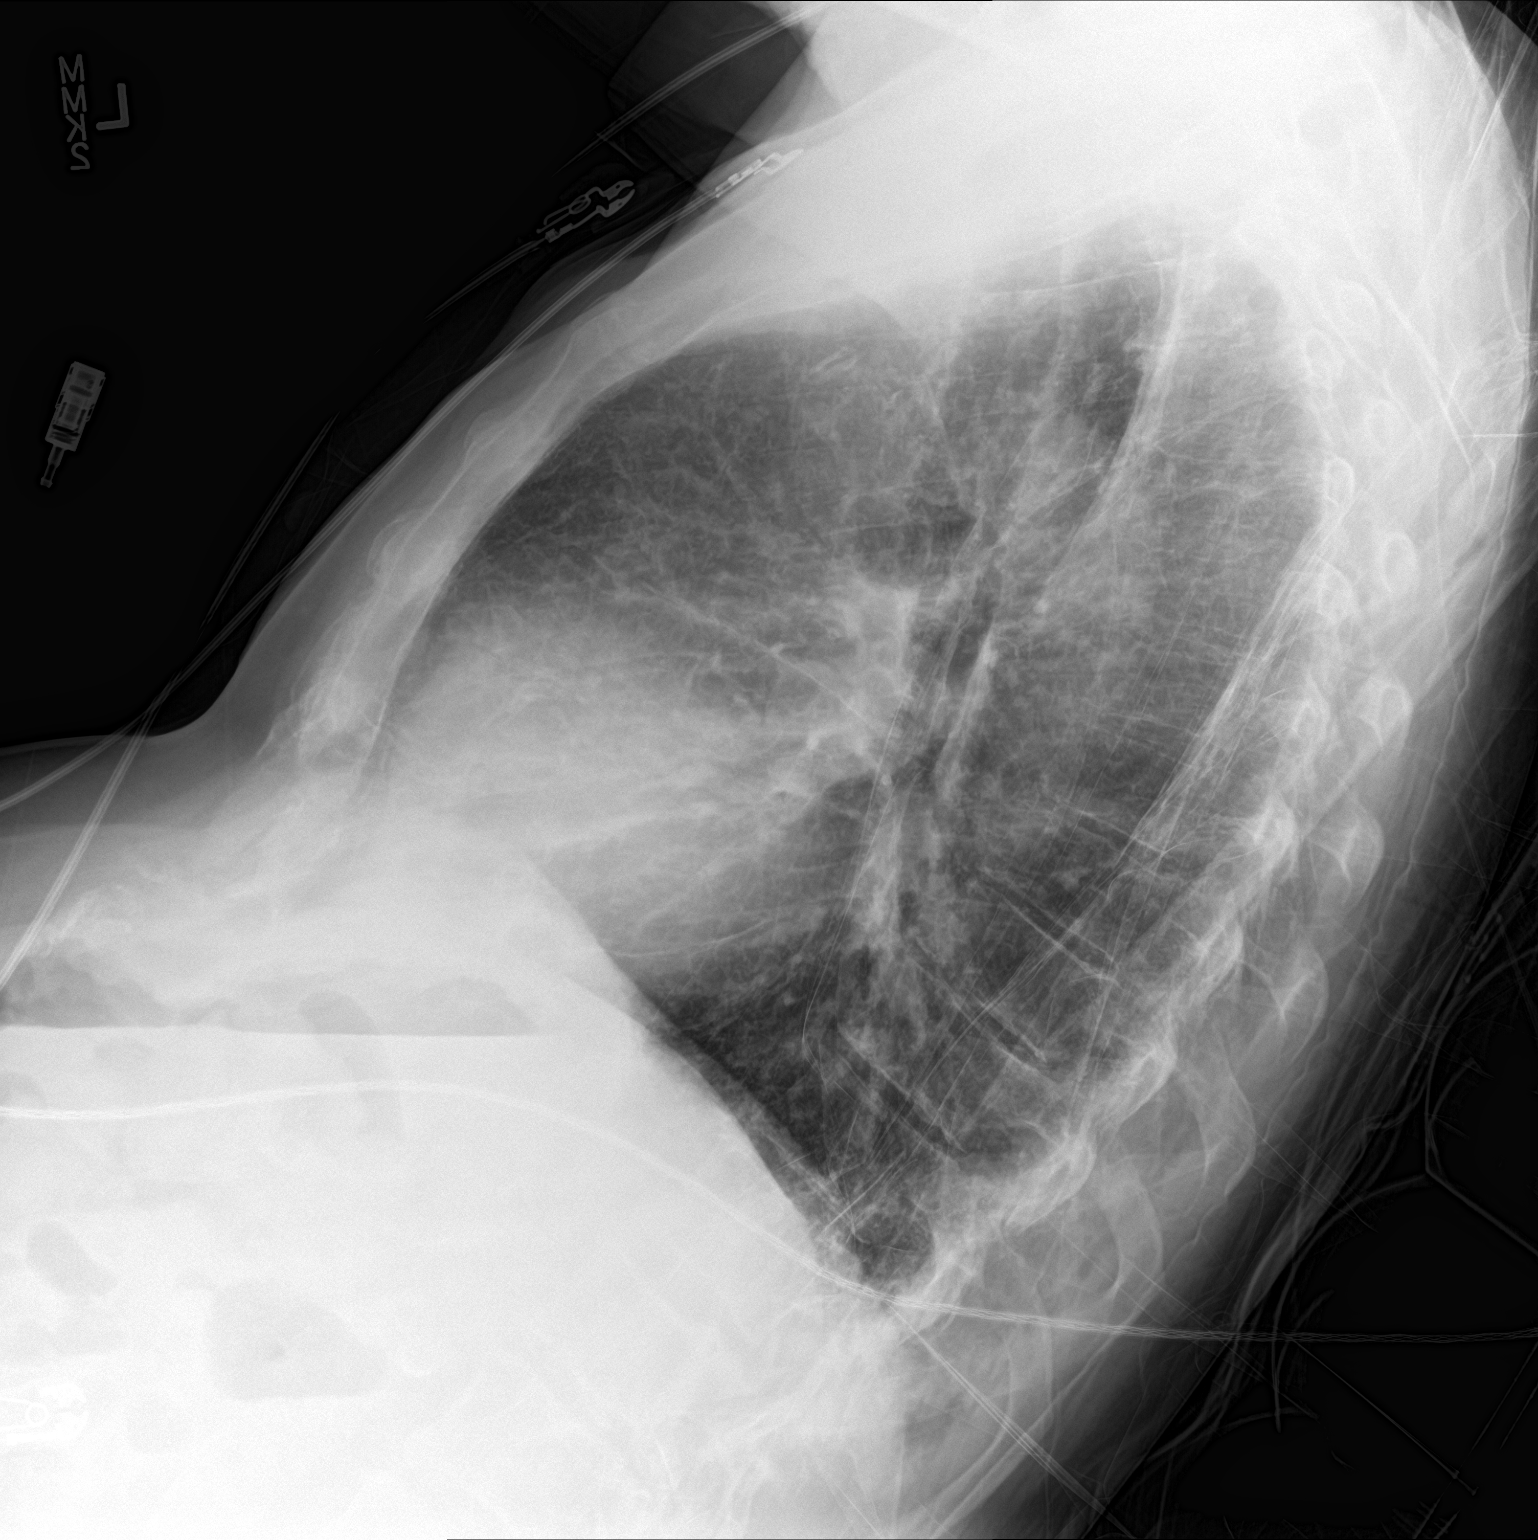

[chest ap]
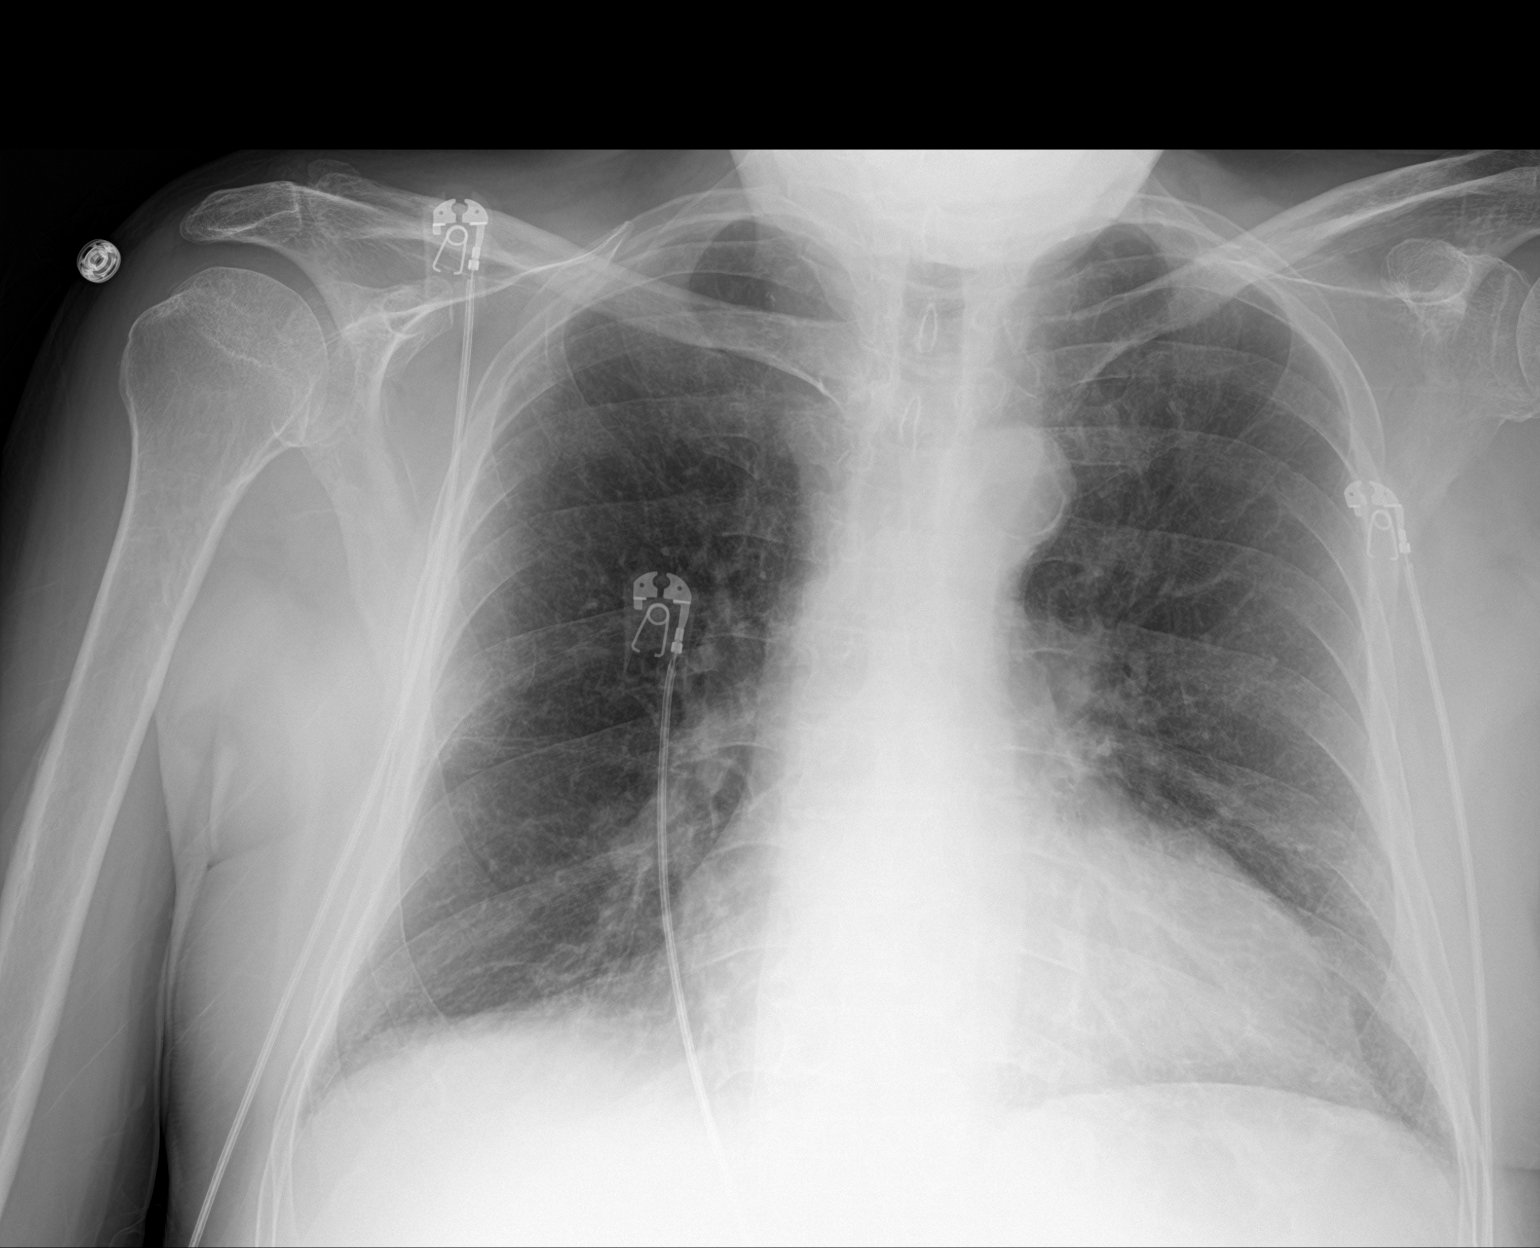

[2 of 2 positions shown; findings below may reference images not displayed]

FINDINGS: Cardiomegaly. Calcified tortuous aorta. No active infiltrates or
failure. Mild hyperinflation suggesting COPD. Improved interstitial
prominence compared with yesterday's radiograph. Osteopenia.
IMPRESSION: Cardiomegaly. Lung fields now essentially clear. No active
infiltrates or failure.

## 2017-07-06 MED ORDER — CLINDAMYCIN HCL 300 MG PO CAPS: 600.0000 mg | ORAL_CAPSULE | Freq: Three times a day (TID) | ORAL | 0 refills | Status: DC

## 2017-07-06 NOTE — Progress Notes (Signed)
Wound re-check appointment. Steri strips removed. Incision edge on right side unapproximated with pinhole. Assessed by SK, sanguineous drainage noted,  Pinhole packed with iodoform, clindamycin ordered. ROV with [facility] for wound re-check 07/10/17 while JA is in the office.

## 2017-07-08 IMAGING — RF DG SWALLOWING FUNCTION - NRPT [facility]
1 series · 18 of 24 positions shown · non-contrast
Comparison: none

[Series 1: run · 11 acquisitions, 18 frames shown]
[im 1/11]
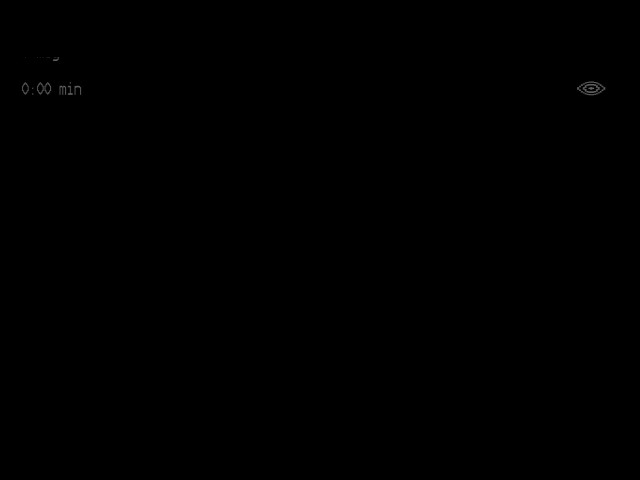
[im 2/11]
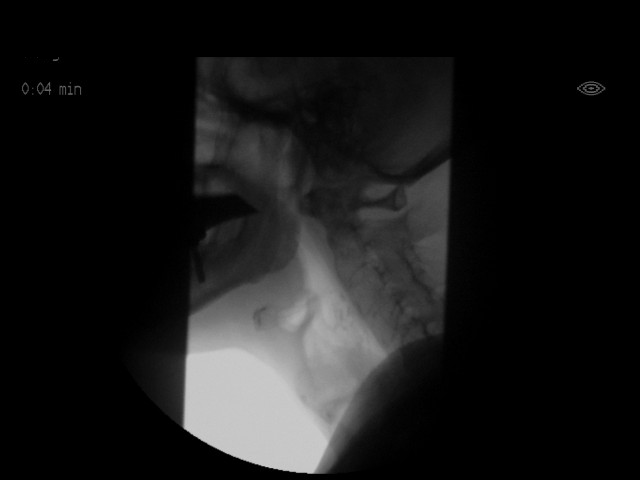
[im 2/11]
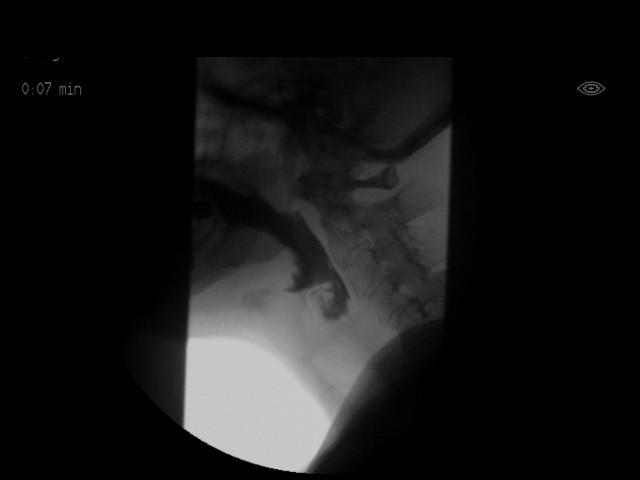
[im 2/11]
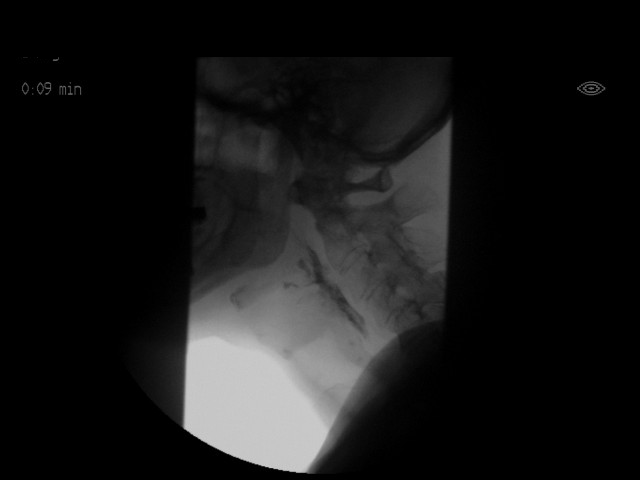
[im 3/11]
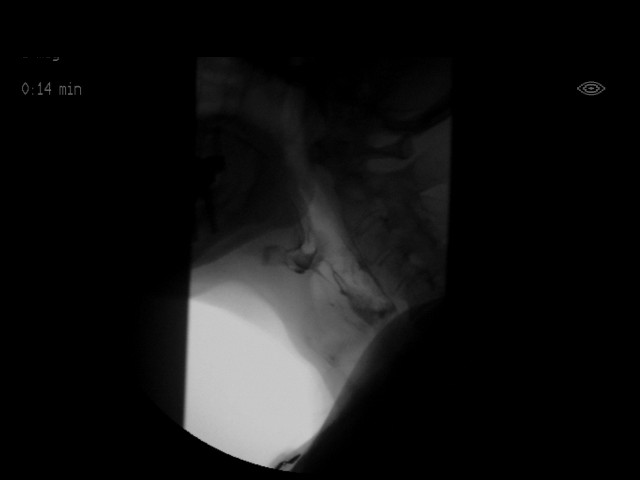
[im 4/11]
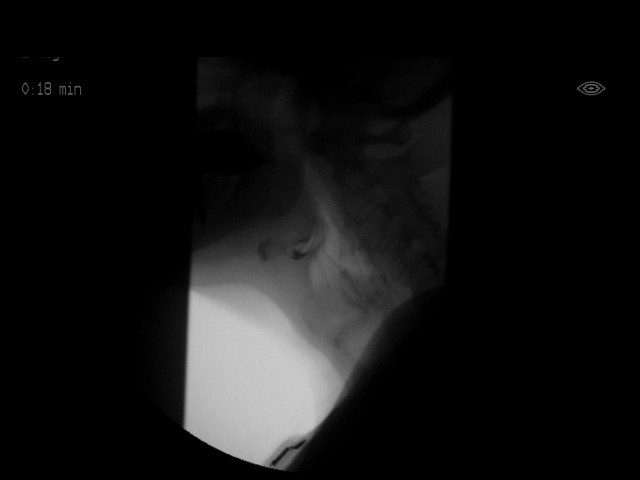
[im 4/11]
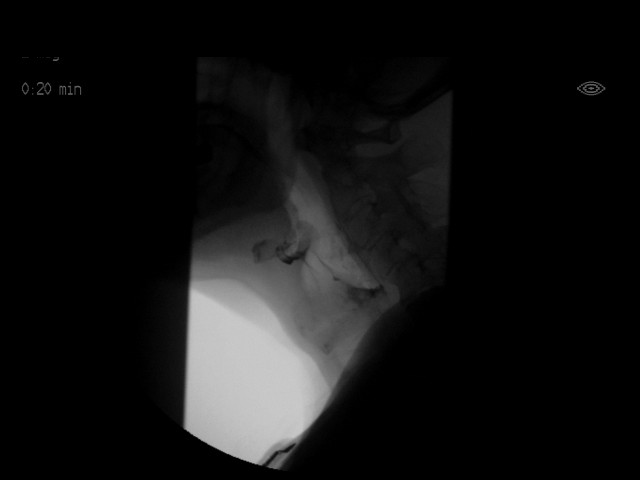
[im 5/11]
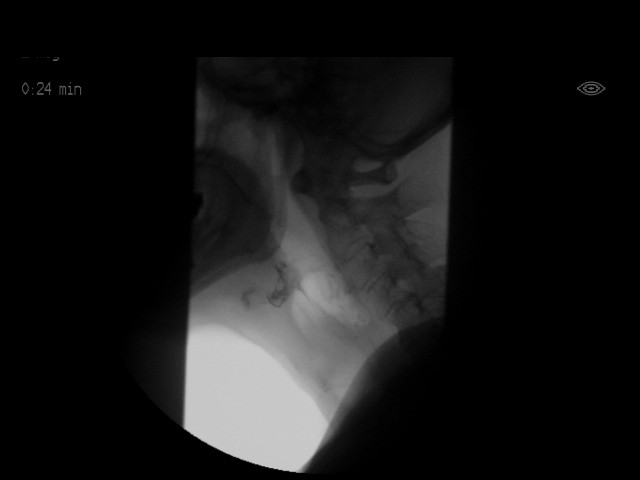
[im 6/11]
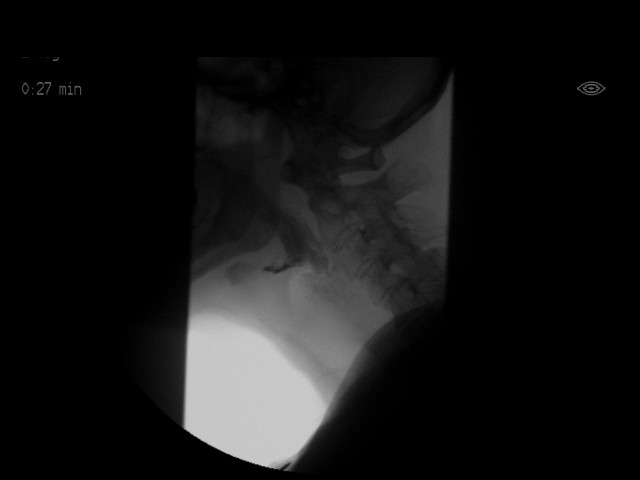
[im 6/11]
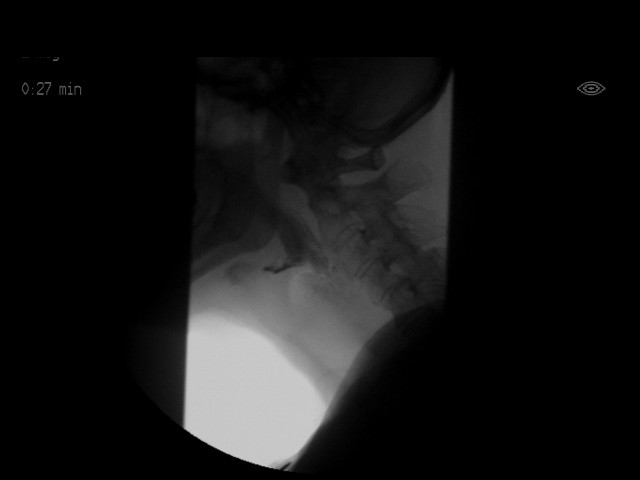
[im 7/11]
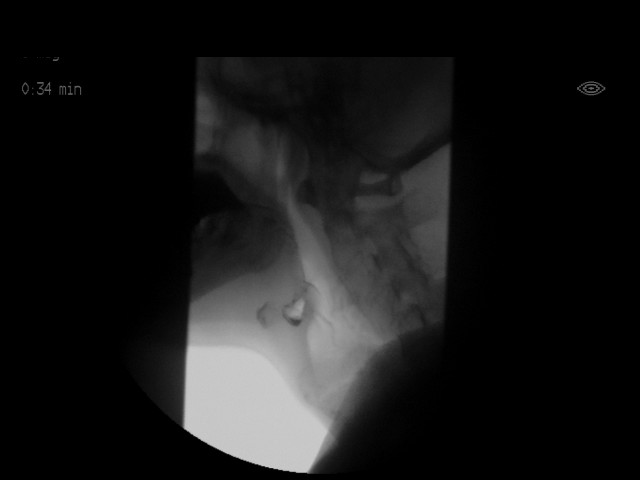
[im 8/11]
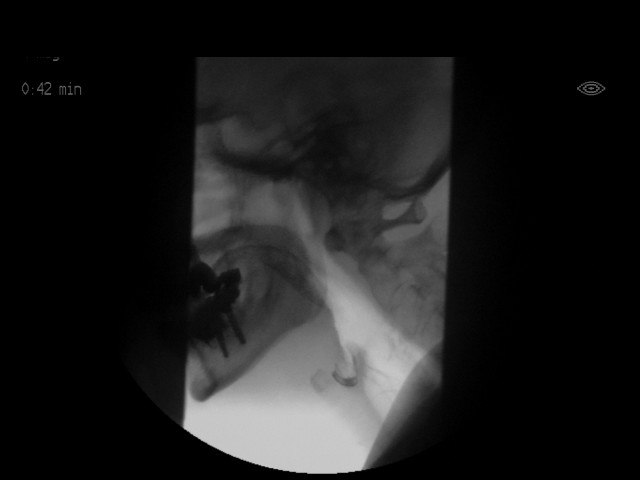
[im 8/11]
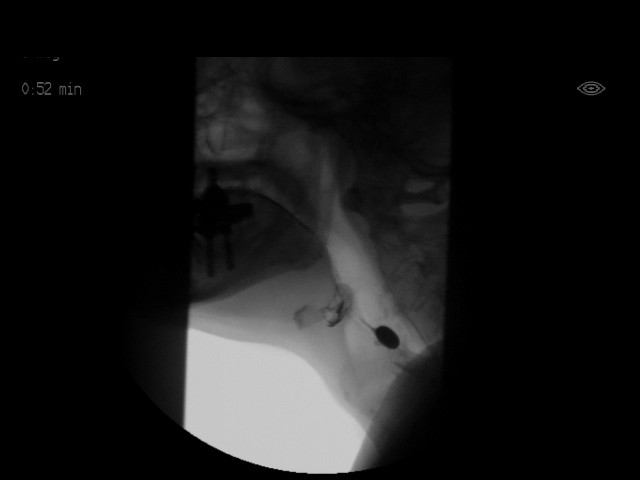
[im 9/11]
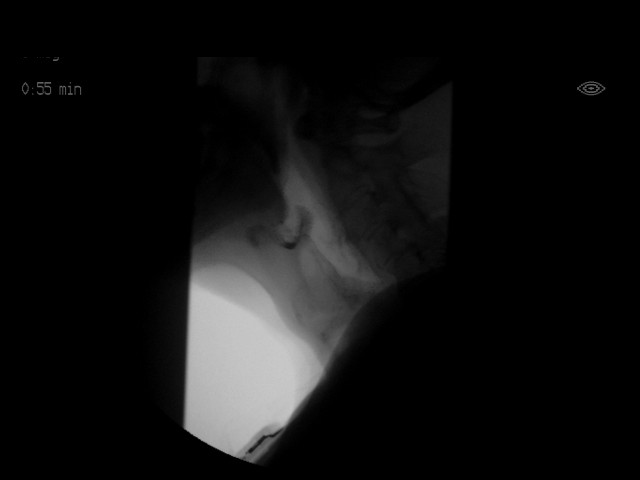
[im 10/11]
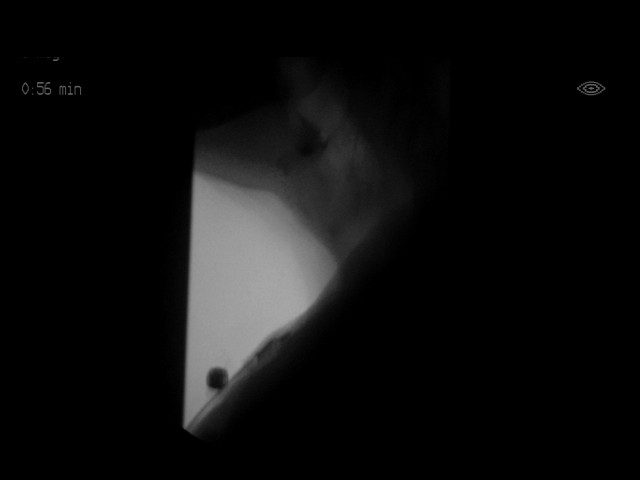
[im 10/11]
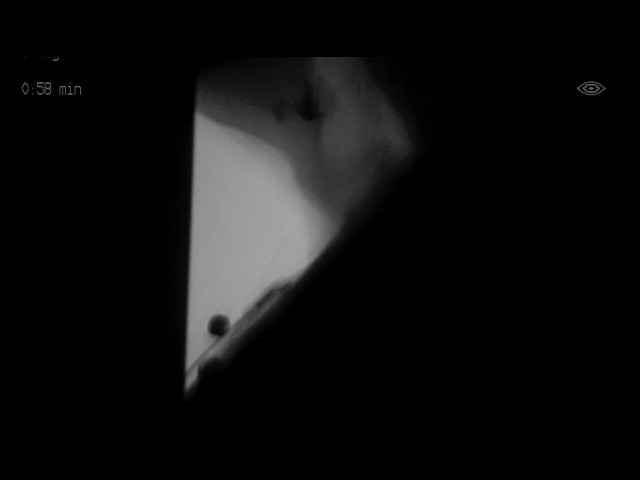
[im 11/11]
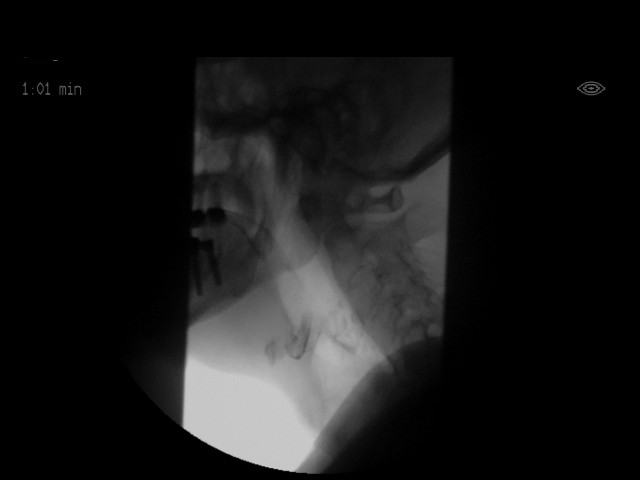
[im 11/11]
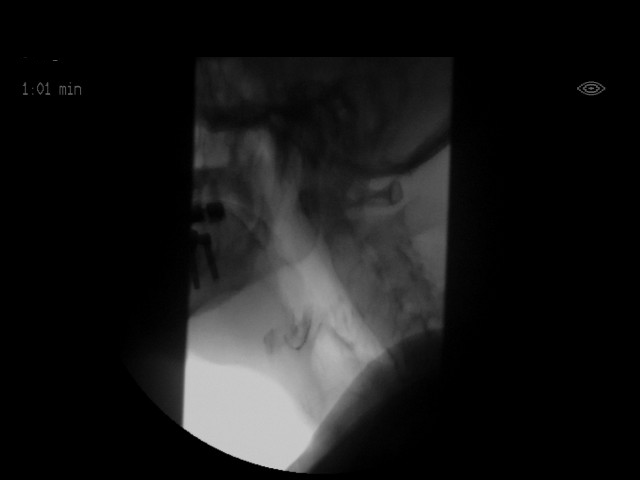

[18 of 24 positions shown; findings below may reference images not displayed]

FLUOROSCOPY FOR SWALLOWING FUNCTION STUDY:
Fluoroscopy was provided for swallowing function study, which was administered by a speech pathologist.  Final results and recommendations from this study are contained within the speech pathology report.

## 2017-07-10 ENCOUNTER — Telehealth: Payer: Self-pay

## 2017-07-10 ENCOUNTER — Other Ambulatory Visit: Payer: Self-pay

## 2017-07-10 ENCOUNTER — Ambulatory Visit (INDEPENDENT_AMBULATORY_CARE_PROVIDER_SITE_OTHER): Payer: Self-pay | Admitting: *Deleted

## 2017-07-10 DIAGNOSIS — I441 Atrioventricular block, second degree: Secondary | ICD-10-CM

## 2017-07-10 LAB — CUP PACEART INCLINIC DEVICE CHECK
Implantable Lead Implant Date: 20180927
Implantable Lead Location: 753859
Implantable Lead Model: 5076
Implantable Pulse Generator Implant Date: 20180927
MDC IDC LEAD IMPLANT DT: 20180927
MDC IDC LEAD LOCATION: 753860
MDC IDC SESS DTM: 20181022161417

## 2017-07-10 MED ORDER — CLINDAMYCIN HCL 300 MG PO CAPS: 600.0000 mg | ORAL_CAPSULE | Freq: Three times a day (TID) | ORAL | 0 refills | Status: AC

## 2017-07-10 NOTE — Telephone Encounter (Signed)
LVM to discuss the re-order of patients clindamycin for an additional week per Dr. Rayann Heman.

## 2017-07-10 NOTE — Progress Notes (Signed)
Wound re-check in clinic. Large band-aid removed. Iodoform pulled from opening at left lateral incision. Serosanguoinous drainage present upon removal. Patient reports taking his clindamycin as prescribed and states he is free of a fever or chills. JA assessed and expressed additional serosanguoinus drainage. Steristrips and gauze with tegaderm applied. Patient instructed to remove tegaderm in three days and prescribed an additional week of clindamycin. ROV in [place] on 10/19 while JA is in office.

## 2017-07-11 NOTE — Telephone Encounter (Signed)
Spoke with patient and confirmed an additional week of clindamycin and confirmed his appt at 2pm on 10/29

## 2017-07-13 ENCOUNTER — Ambulatory Visit (INDEPENDENT_AMBULATORY_CARE_PROVIDER_SITE_OTHER): Payer: Self-pay | Admitting: *Deleted

## 2017-07-13 ENCOUNTER — Telehealth: Payer: Self-pay | Admitting: Cardiology

## 2017-07-13 ENCOUNTER — Ambulatory Visit (INDEPENDENT_AMBULATORY_CARE_PROVIDER_SITE_OTHER): Payer: Medicare Other | Admitting: *Deleted

## 2017-07-13 DIAGNOSIS — I441 Atrioventricular block, second degree: Secondary | ICD-10-CM

## 2017-07-13 NOTE — Progress Notes (Signed)
Mr. Davie presents today to the [facility] for a wound re-check s/p ppm implant 06/15/17. Healing hematoma noted. Steri-strips intact over left chest left lateral incision edge. Dr. Rayann Heman assessed incision, no recommended changes. Small reddened area below incision- appears to be a follicular irritation, no drainage able to be expressed, covered with a bandaid and antibiotic ointment. Follow up as scheduled on Monday with Device Clinic while JA in office. Patient instructed to complete antibiotics, seek treatment if he experiences fever <101, shaking chills, or profuse bleeding from incision. He verbalizes understanding.

## 2017-07-13 NOTE — Telephone Encounter (Signed)
Called pt to confirm remote appt. He had some concerns about his wound site. Called routed to [place].

## 2017-07-13 NOTE — Telephone Encounter (Signed)
Joshua [name] feels like there is blood collecting under the incision where he had his ppm implanted. It was closed with steri-strips in office Monday 07/10/17 and he feels like the pocket of blood has gotten larger. No fever or chills. He will come to the [facility] office today at 2 for wound evaluation. JA made aware.

## 2017-07-13 NOTE — Telephone Encounter (Signed)
Spoke with pt and reminded pt of remote transmission that is due today. Pt verbalized understanding.   

## 2017-07-13 NOTE — Telephone Encounter (Signed)
R/s to 3pm today in order for JA to come over for wound eval.

## 2017-07-14 ENCOUNTER — Encounter: Payer: Self-pay | Admitting: Cardiology

## 2017-07-14 NOTE — Progress Notes (Signed)
Remote pacemaker transmission.   

## 2017-07-17 ENCOUNTER — Ambulatory Visit (INDEPENDENT_AMBULATORY_CARE_PROVIDER_SITE_OTHER): Payer: Self-pay | Admitting: *Deleted

## 2017-07-17 DIAGNOSIS — Z950 Presence of cardiac pacemaker: Secondary | ICD-10-CM

## 2017-07-17 DIAGNOSIS — I441 Atrioventricular block, second degree: Secondary | ICD-10-CM

## 2017-07-17 NOTE — Patient Instructions (Signed)
If you need to change your dressing, apply the gauze first and then apply the clear occlusive dressing (Tegaderm).  Call the [facility] at [phone] if you note any signs/symptoms of infection, including fever (>101 degrees Farenheit) or shaking chills.

## 2017-07-17 NOTE — Progress Notes (Signed)
Patient presented for a PPM wound recheck, s/p implant on 06/15/17.  Patient still taking antibiotics and aware to finish full course.  Patient denies fever or chills.  Healing hematoma noted.  Steri-strips removed from L lateral incision at Dr. Jackalyn Lombard instruction.  Bandaid removed from follicular irritation site below incision.  Dr. Rayann Heman assessed site, was able to express small amount of serous drainage.  Per Dr. Rayann Heman, Steri-strips reapplied, along with gauze/Tegaderm dressing.  Patient given additional gauze/Tegaderm dressing supplies.  Patient agreeable to wound recheck appointment on 07/24/17 at 2:00pm while Dr. Rayann Heman is in the office.  Patient is aware to contact the [facility] for fever/chills or other symptoms of infection.

## 2017-07-20 ENCOUNTER — Telehealth: Payer: Self-pay | Admitting: Cardiology

## 2017-07-20 NOTE — Telephone Encounter (Signed)
Called Mr. Strzelecki back, he was just making sure that he didn't need another prescription for antibiotic, Mr. Moomaw stated that he had not been running a fever and that there was still some drainage informed Mr. Nou that I would reach out to Dr.Allred and would call back when I received recommndations from him. Pt voiced understanding.

## 2017-07-20 NOTE — Telephone Encounter (Signed)
Patient calling in regards to his antibiotic. Please call back to discuss.

## 2017-07-21 NOTE — Telephone Encounter (Signed)
Spoke with Joshua [name] informed him that Dr. Rayann Heman doesn't recommend another course of antibiotic at this time. Pt voiced understanding

## 2017-07-24 ENCOUNTER — Ambulatory Visit (INDEPENDENT_AMBULATORY_CARE_PROVIDER_SITE_OTHER): Payer: Self-pay | Admitting: *Deleted

## 2017-07-24 DIAGNOSIS — I441 Atrioventricular block, second degree: Secondary | ICD-10-CM

## 2017-07-24 NOTE — Progress Notes (Signed)
Wound re-check, tegaderm and steri strips removed with scants amount of serous drainage present on gauze. Able to additionally express scant amounts of serous drainage from left lateral opening of incision. Patient denies any fever or chills. JA assessed and recommended additional steri strips to be applied with follow up appointment in 2 weeks. Area cleaned, steri strips and gauze with tegaderm applied. Additional tegaderm given to patient for showering at home. ROV in [place] scheduled for 11/21 at 2pm with JA in office. Patient aware to call with any s/s of infection.

## 2017-07-25 LAB — CUP PACEART REMOTE DEVICE CHECK
Implantable Lead Location: 753860
Implantable Lead Model: 5076
Implantable Lead Model: 5076
Implantable Pulse Generator Implant Date: 20180927
MDC IDC LEAD IMPLANT DT: 20180927
MDC IDC LEAD IMPLANT DT: 20180927
MDC IDC LEAD LOCATION: 753859
MDC IDC SESS DTM: 20181106093826

## 2017-08-05 IMAGING — CR DG CHEST 2V
2 series · 2 of 2 positions shown · non-contrast
Comparison: 05/02/2016

CLINICAL DATA: Cough, weakness, history prostate cancer,
hypertension, coronary artery disease post stenting and coronary
PTCA

EXAM:
CHEST  2 VIEW

[x chest ap]
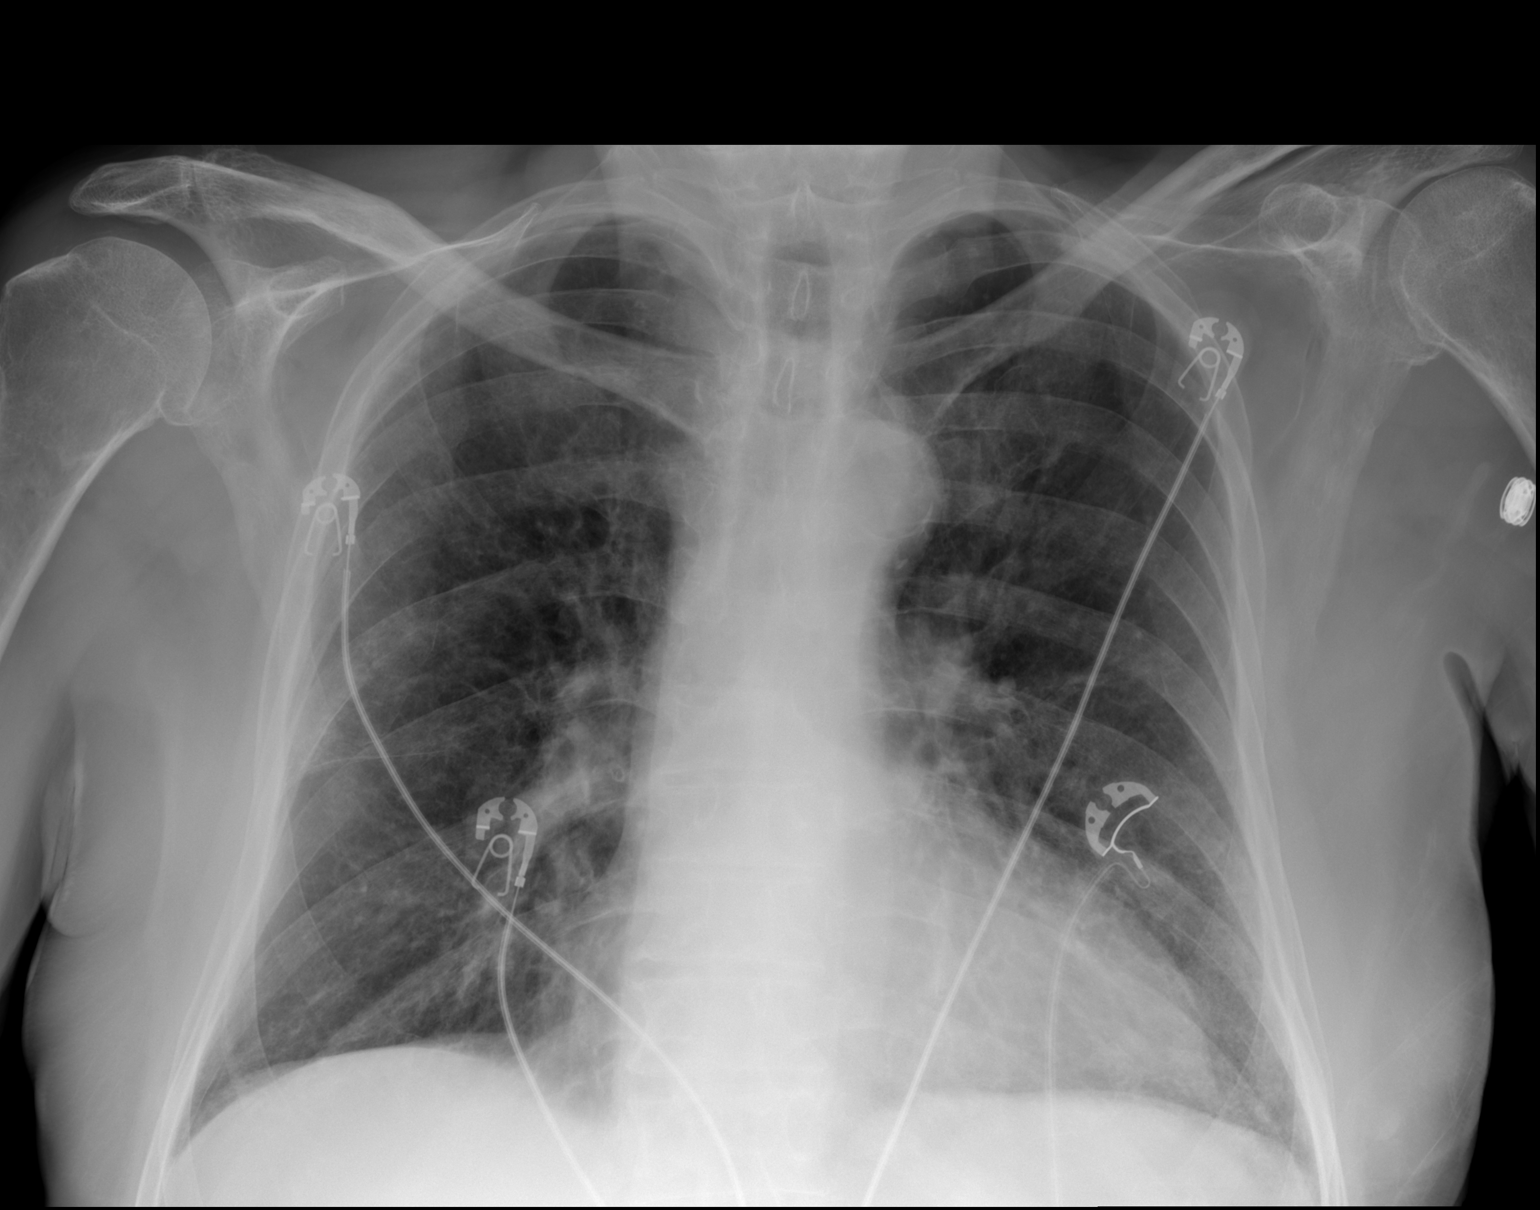

[w chest lat]
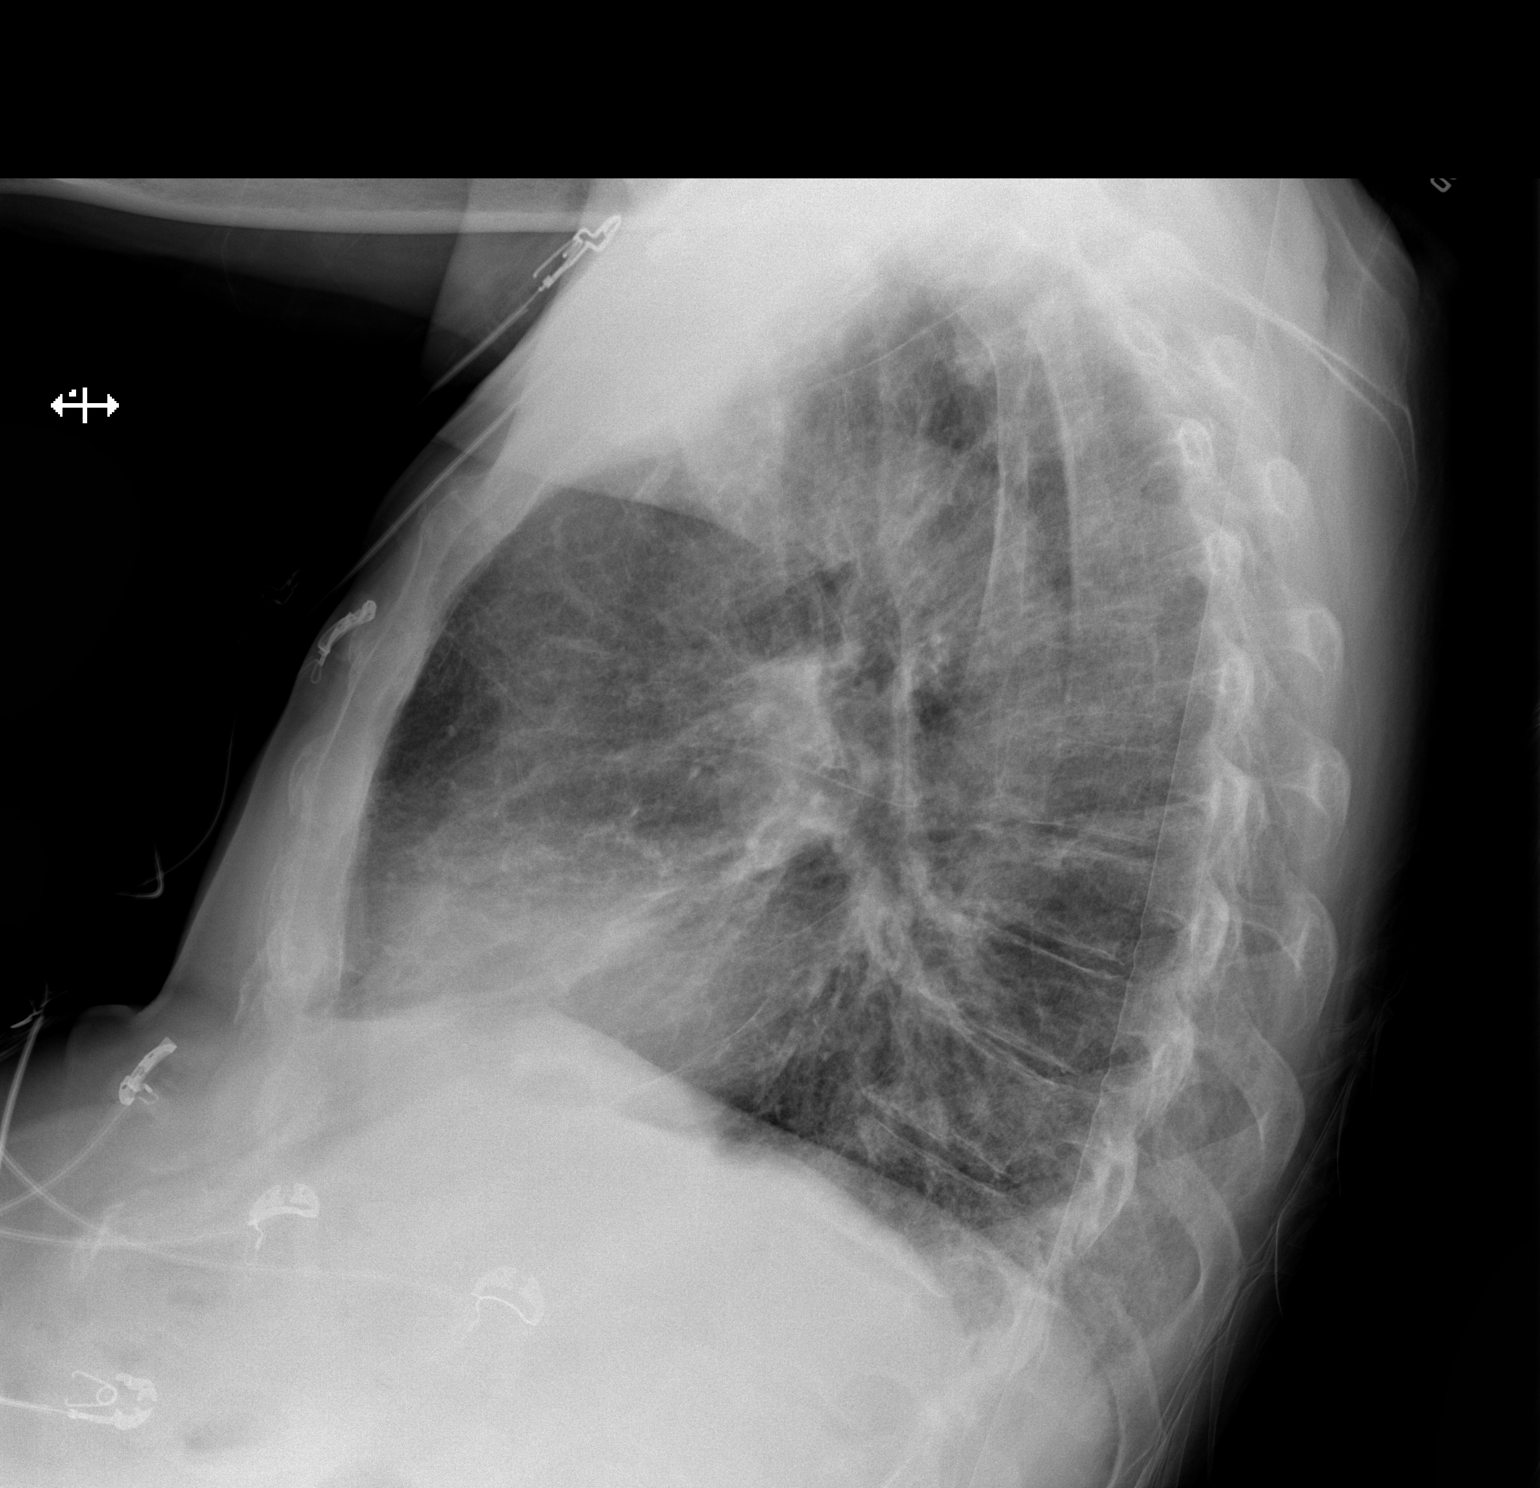

[2 of 2 positions shown; findings below may reference images not displayed]

FINDINGS: Minimal enlargement of cardiac silhouette.

Atherosclerotic calcification aorta.

Mediastinal contours and pulmonary vascularity normal.

Emphysematous changes with mild central peribronchial thickening.

No acute infiltrate, pleural effusion or pneumothorax.

Bones demineralized.
IMPRESSION: Enlargement of cardiac silhouette.

COPD changes without acute infiltrate.

Aortic atherosclerosis.

## 2017-08-07 ENCOUNTER — Ambulatory Visit (INDEPENDENT_AMBULATORY_CARE_PROVIDER_SITE_OTHER): Payer: Medicare Other | Admitting: Physician Assistant

## 2017-08-07 ENCOUNTER — Encounter: Payer: Self-pay | Admitting: Physician Assistant

## 2017-08-07 VITALS — BP 133/66 | HR 77 | Temp 96.4°F | Ht 72.0 in | Wt 182.0 lb

## 2017-08-07 DIAGNOSIS — A09 Infectious gastroenteritis and colitis, unspecified: Secondary | ICD-10-CM | POA: Diagnosis not present

## 2017-08-07 DIAGNOSIS — R197 Diarrhea, unspecified: Secondary | ICD-10-CM | POA: Diagnosis not present

## 2017-08-07 NOTE — Patient Instructions (Signed)

## 2017-08-07 NOTE — Progress Notes (Signed)
BP 133/66   Pulse 77   Temp (!) 96.4 F (35.8 C) (Oral)   Ht 6' (1.829 m)   Wt 182 lb (82.6 kg)   BMI 24.68 kg/m    Subjective:    Patient ID: Joshua [name], male    DOB: 1932-07-18, 83 y.o.   MRN: [id number]  HPI: Joshua [name] is a 83 y.o. male presenting on 08/07/2017 for Diarrhea  Over the past 7 days the patient has had copious amounts of diarrhea.  He denies any nausea or vomiting.  He has not been around anyone else that has been sick.  The diarrhea has greatly flared up his hemorrhoids and he has had blood in his stools.  Diet has been okay.  Over the past 2 days the bowel movements have begun to slow down and seem to be a little more formed.  He has taken some Imodium over the past few days.  He reports that he took 2 yesterday.  He did have a loose stool this morning upon arising.  He states his been sleeping well.  The only discomfort is in his lower abdomen.  He denies seeing any mucus.  Relevant past medical, surgical, family and social history reviewed and updated as indicated. Allergies and medications reviewed and updated.  Past Medical History:  Diagnosis Date  . 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 Arthritis   . Bilateral lower extremity edema   . Bladder tumor   . CAD (coronary artery disease) cardiologist--  dr Daneen Schick   a. 04/27/2016 PCI with DES to RCA with 50% ostial to 60% segmental mid to distal left main, and 90% thrombus filled ostial to proximal circumflex, with distal right coronary filled by collaterals from left-to-right  . COPD with emphysema ([place])   . Dyspnea on exertion   . ED (erectile dysfunction)   . First degree AV block   . GERD (gastroesophageal reflux disease)   . History of adenomatous polyp of colon    09-22-2008  . History of bladder cancer    s/p  resection bladder tumor 04-19-2016  non-invasive low-grade urothelial carcinoma  . History of prostate cancer urologist-  dr  Jeffie Pollock-- last PSA 0.02 (summer 2017)   dx 02/ 2013-- Stage T2a, Gleason 7, PSA 1.58--  s/p  radioactive prostate seed implants 01-19-2012  . History of ST elevation myocardial infarction (STEMI)    04-27-2016  . Hyperlipidemia   . Hypertension   . Presence of permanent cardiac pacemaker   . S/P drug eluting coronary stent placement    04-27-2016   . Second degree Mobitz I AV block    occasional w/ first degree heart block  per cardiologist note by dr Daneen Schick  . Seizures ([place]) per pt son -- no seizure's since 1980   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  arteriorvenous cerebral malformation s/p  craniotomy 1980  . Vitamin D deficiency     Past Surgical History:  Procedure Laterality Date  . APPENDECTOMY  2011  . BLEPHAROPLASTY Bilateral revision 02-17-2016  . CATARACT EXTRACTION W/ INTRAOCULAR LENS  IMPLANT, BILATERAL  2016  approx.  . Coronary Stent Intervention N/A 04/27/2016   Performed by Belva Crome, MD at [facility]  . CRANIOTOMY  1980   intracranial bleed from arteriorvenous malformation (left parietal)  . CYSTOSCOPY  01/19/2012   Performed by [name], MD at [facility]  .  CYSTOSCOPY WITH BILATERAL RETROGRADE PYELOGRAM TRANSURETHRAL RESECTION OF BLADDER TUMOR  Bilateral 04/19/2016   Performed by [name], MD at [facility]  . CYSTOSCOPY WITH BIOPSY AND FULGURATION N/A 11/10/2016   Performed by [name], MD at [facility]  . INSERT / REPLACE / REMOVE PACEMAKER  06/15/2017  . Left Heart Cath and Coronary Angiography N/A 04/27/2016   Performed by Belva Crome, MD at [place] [facility]  . Pacemaker Implant N/A 06/15/2017   Performed by [name], MD at [facility]  . RADIOACTIVE SEED IMPLANT/BRACHYTHERAPY IMPLANT N/A 01/19/2012   Performed by [name], MD at [facility]  . Temporary Pacemaker N/A 04/27/2016   Performed by Belva Crome, MD at [place] [facility]  . TRANSTHORACIC ECHOCARDIOGRAM   06/02/2016   ef 60-65%/  trivial MR/  mild TR    Review of Systems  Constitutional: Negative.  Negative for appetite change and fatigue.  HENT: Negative.   Eyes: Negative.  Negative for pain and visual disturbance.  Respiratory: Negative.  Negative for cough, chest tightness, shortness of breath and wheezing.   Cardiovascular: Negative.  Negative for chest pain, palpitations and leg swelling.  Gastrointestinal: Positive for blood in stool and diarrhea. Negative for abdominal distention, abdominal pain, nausea and vomiting.  Endocrine: Negative.   Genitourinary: Negative.   Musculoskeletal: Negative.   Skin: Negative.  Negative for color change and rash.  Neurological: Negative.  Negative for weakness, numbness and headaches.  Psychiatric/Behavioral: Negative.     Allergies as of 08/07/2017      Reactions   Penicillins Rash   Has patient had a PCN reaction causing immediate rash, facial/tongue/throat swelling, SOB or lightheadedness with hypotension: Yes Has patient had a PCN reaction causing severe rash involving mucus membranes or skin necrosis: No Has patient had a PCN reaction that required hospitalization No Has patient had a PCN reaction occurring within the last 10 years: No If all of the above answers are "NO", then may proceed with Cephalosporin use.      Medication List        Accurate as of 08/07/17 10:25 AM. Always use your most recent med list.          aspirin EC 81 MG tablet Take 1 tablet (81 mg total) by mouth daily.   atorvastatin 20 MG tablet Commonly known as:  LIPITOR Take 1 tablet (20 mg total) by mouth daily.   calcium carbonate 1500 (600 Ca) MG Tabs tablet Commonly known as:  OSCAL Take 600 mg of elemental calcium by mouth daily with breakfast.   citalopram 10 MG tablet Commonly known as:  CELEXA Take 10 mg by mouth daily.   clopidogrel 75 MG tablet Commonly known as:  PLAVIX Take 1 tablet (75 mg total) by mouth daily.   PHENobarbital 64.8  MG tablet Commonly known as:  LUMINAL Take 1 tablet (64.8 mg total) by mouth 2 (two) times daily.   phenytoin 100 MG ER capsule Commonly known as:  DILANTIN Take 200 mg by mouth 2 (two) times daily.   PRESERVISION AREDS 2 Caps Take 1 capsule by mouth daily.   spironolactone 25 MG tablet Commonly known as:  ALDACTONE Take 1 tablet (25 mg total) by mouth daily.   Vitamin D3 1000 units Caps Take 1 capsule by mouth daily.          Objective:    BP 133/66   Pulse 77   Temp (!) 96.4 F (35.8  C) (Oral)   Ht 6' (1.829 m)   Wt 182 lb (82.6 kg)   BMI 24.68 kg/m   Allergies  Allergen Reactions  . Penicillins Rash    Has patient had a PCN reaction causing immediate rash, facial/tongue/throat swelling, SOB or lightheadedness with hypotension: Yes Has patient had a PCN reaction causing severe rash involving mucus membranes or skin necrosis: No Has patient had a PCN reaction that required hospitalization No Has patient had a PCN reaction occurring within the last 10 years: No If all of the above answers are "NO", then may proceed with Cephalosporin use.     Physical Exam  Constitutional: He appears well-developed and well-nourished. No distress.  HENT:  Head: Normocephalic and atraumatic.  Eyes: Conjunctivae and EOM are normal. Pupils are equal, round, and reactive to light.  Cardiovascular: Normal rate, regular rhythm and normal heart sounds.  Pulmonary/Chest: Effort normal and breath sounds normal. No respiratory distress.  Abdominal: Soft. Normal appearance. He exhibits no shifting dullness, no distension and no mass. Bowel sounds are increased. There is no tenderness. There is no rigidity, no guarding, no CVA tenderness, no tenderness at McBurney's point and negative Murphy's sign.  Skin: Skin is warm and dry.  Psychiatric: He has a normal mood and affect. His behavior is normal.  Nursing note and vitals reviewed.       Assessment & Plan:   1. Diarrhea of infectious  origin Brat diet Ginger ale Plenty of fluids Imodium 1 tablet up to 3 times a day if loose stools.  Over the next 2 days expect this to continue to decrease.  If things do not improve please call our office back.   Current Outpatient Medications:  .  aspirin EC 81 MG tablet, Take 1 tablet (81 mg total) by mouth daily., Disp: 90 tablet, Rfl: 3 .  atorvastatin (LIPITOR) 20 MG tablet, Take 1 tablet (20 mg total) by mouth daily., Disp: 90 tablet, Rfl: 3 .  calcium carbonate (OSCAL) 1500 (600 Ca) MG TABS tablet, Take 600 mg of elemental calcium by mouth daily with breakfast., Disp: , Rfl:  .  Cholecalciferol (VITAMIN D3) 1000 units CAPS, Take 1 capsule by mouth daily., Disp: , Rfl:  .  citalopram (CELEXA) 10 MG tablet, Take 10 mg by mouth daily., Disp: , Rfl:  .  clopidogrel (PLAVIX) 75 MG tablet, Take 1 tablet (75 mg total) by mouth daily., Disp: 90 tablet, Rfl: 3 .  Multiple Vitamins-Minerals (PRESERVISION AREDS 2) CAPS, Take 1 capsule by mouth daily., Disp: , Rfl:  .  PHENobarbital (LUMINAL) 64.8 MG tablet, Take 1 tablet (64.8 mg total) by mouth 2 (two) times daily., Disp: 180 tablet, Rfl: 3 .  phenytoin (DILANTIN) 100 MG ER capsule, Take 200 mg by mouth 2 (two) times daily. , Disp: , Rfl:  .  spironolactone (ALDACTONE) 25 MG tablet, Take 1 tablet (25 mg total) by mouth daily., Disp: 90 tablet, Rfl: 3 Continue all other maintenance medications as listed above.  Follow up plan: No Follow-up on file.  Educational handout given for [place] PA-C [place] [address]  [place], [place] [address] [phone]   08/07/2017, 10:25 AM

## 2017-08-09 ENCOUNTER — Ambulatory Visit (INDEPENDENT_AMBULATORY_CARE_PROVIDER_SITE_OTHER): Payer: Self-pay | Admitting: *Deleted

## 2017-08-09 DIAGNOSIS — Z950 Presence of cardiac pacemaker: Secondary | ICD-10-CM

## 2017-08-09 DIAGNOSIS — I441 Atrioventricular block, second degree: Secondary | ICD-10-CM

## 2017-08-09 NOTE — Progress Notes (Signed)
Wound recheck in clinic.  Tegaderm, gauze, and Steri-strips removed, scant serous drainage noted on Steri-strips.  Patient denies fever or chills.  Left lateral incision corner remains unapproximated, but area of unapproximation appears smaller than at previous checks.  Dr. Rayann Heman assessed site, unable to express any drainage, recommended keeping site covered with a bandaid, except when bathing.  Patient instructed to wash site with soap and water daily.  Patient verbalizes understanding of instructions to call the [facility] if he notes any signs/symptoms of infection, and to continue monitoring for healing progress.  ROV with Dr. Rayann Heman on 09/18/17 as scheduled, unless any issues or concerns arise.

## 2017-08-09 NOTE — Patient Instructions (Signed)
Wash site with soap and water when you shower.  Keep covered with a bandaid otherwise until wound is fully healed.  Call the [facility] at [phone] if you note any signs/symptoms of infection, including new or worsening drainage, redness, fever/chills.

## 2017-08-13 NOTE — Progress Notes (Signed)
Cardiology Office Note    Date:  08/14/2017   ID:  Joshua [name], DOB 12-May-1932, MRN [id number]  PCP:  Chipper Herb, MD  Cardiologist: Sinclair Grooms, MD   Chief Complaint  Patient presents with  . Coronary Artery Disease  . Pacemaker Problem    History of Present Illness:  Joshua [name] is a 83 y.o. male with history of sz disorder that followed intracerebral bleed from AV malformation,, prostate CA, bladder CA HTN, HLD, GERD, recent STEMI 05/10/2016 w/ DES to RCA complicated by Mobitz II-->HR improved and PPM not indicated.  Has had permanent pacemaker implantation based upon findings by Holter.  The pacer was placed by Dr. Rayann Heman.  Normal function has been noted but he has had some difficulty with the pacer incision healing.  He denies orthopnea, PND, fever, syncope.  No palpitations.  No angina.    Past Medical History:  Diagnosis Date  . 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 Arthritis   . Bilateral lower extremity edema   . Bladder tumor   . CAD (coronary artery disease) cardiologist--  dr Daneen Schick   a. 04/27/2016 PCI with DES to RCA with 50% ostial to 60% segmental mid to distal left main, and 90% thrombus filled ostial to proximal circumflex, with distal right coronary filled by collaterals from left-to-right  . COPD with emphysema ([place])   . Dyspnea on exertion   . ED (erectile dysfunction)   . First degree AV block   . GERD (gastroesophageal reflux disease)   . History of adenomatous polyp of colon    09-22-2008  . History of bladder cancer    s/p  resection bladder tumor 04-19-2016  non-invasive low-grade urothelial carcinoma  . History of prostate cancer urologist-  dr Jeffie Pollock-- last PSA 0.02 (summer 2017)   dx 02/ 2013-- Stage T2a, Gleason 7, PSA 1.58--  s/p  radioactive prostate seed implants 01-19-2012  . History of ST elevation myocardial infarction (STEMI)    04-27-2016  . Hyperlipidemia     . Hypertension   . Presence of permanent cardiac pacemaker   . S/P drug eluting coronary stent placement    04-27-2016   . Second degree Mobitz I AV block    occasional w/ first degree heart block  per cardiologist note by dr Daneen Schick  . Seizures ([place]) per pt son -- no seizure's since 1980   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  arteriorvenous cerebral malformation s/p  craniotomy 1980  . Vitamin D deficiency     Past Surgical History:  Procedure Laterality Date  . APPENDECTOMY  2011  . BLEPHAROPLASTY Bilateral revision 02-17-2016  . CARDIAC CATHETERIZATION N/A 04/27/2016   Procedure: Left Heart Cath and Coronary Angiography;  Surgeon: Belva Crome, MD;  Location: [facility];  Service: Cardiovascular;  Laterality: N/A;  acute inferior wall STEMI ;  ostCx to midCx 90%,  ost1st Mrg to 1st Mrg 50%,  mid to distal RCA 100%,  pRCA 80%,  LVEF 45-50% w/ inferobasal akinesis  . CARDIAC CATHETERIZATION N/A 04/27/2016   Procedure: Temporary Pacemaker;  Surgeon: Belva Crome, MD;  Location: [facility];  Service: Cardiovascular;  Laterality: N/A;  mobitz 2  second degree HB w/ heart rate 29bpm  . CARDIAC CATHETERIZATION N/A 04/27/2016   Procedure: Coronary Stent Intervention;  Surgeon: Belva Crome, MD;  Location: [facility];  Service: Cardiovascular;  Laterality: N/A; DES x1 to  Proximal RCA;  DES x1 to Mid RCA  . CATARACT EXTRACTION W/ INTRAOCULAR LENS  IMPLANT, BILATERAL  2016  approx.  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 CYSTOSCOPY  01/19/2012   Procedure: CYSTOSCOPY;  Surgeon: Malka So, MD;  Location: [facility];  Service: Urology;;  no seeds found in bladder  . CYSTOSCOPY W/ RETROGRADES Bilateral 04/19/2016   Procedure: CYSTOSCOPY WITH BILATERAL RETROGRADE PYELOGRAM TRANSURETHRAL RESECTION OF BLADDER TUMOR ;  Surgeon: [name], MD;  Location: [facility];  Service: Urology;  Laterality:  Bilateral;  . CYSTOSCOPY WITH BIOPSY N/A 11/10/2016   Procedure: CYSTOSCOPY WITH BIOPSY AND FULGURATION;  Surgeon: [name], MD;  Location: [facility];  Service: Urology;  Laterality: N/A;  . INSERT / REPLACE / REMOVE PACEMAKER  06/15/2017  . PACEMAKER IMPLANT N/A 06/15/2017   Procedure: Pacemaker Implant;  Surgeon: [name], MD;  Location: [facility];  Service: Cardiovascular;  Laterality: N/A;  . RADIOACTIVE SEED IMPLANT  01/19/2012   Procedure: RADIOACTIVE SEED IMPLANT;  Surgeon: Malka So, MD;  Location: [facility];  Service: Urology;  Laterality: N/A;  68 seeds implanted  . TRANSTHORACIC ECHOCARDIOGRAM  06/02/2016   ef 60-65%/  trivial MR/  mild TR    Current Medications: Outpatient Medications Prior to Visit  Medication Sig Dispense Refill  . aspirin EC 81 MG tablet Take 1 tablet (81 mg total) by mouth daily. 90 tablet 3  . atorvastatin (LIPITOR) 20 MG tablet Take 1 tablet (20 mg total) by mouth daily. 90 tablet 3  . calcium carbonate (OSCAL) 1500 (600 Ca) MG TABS tablet Take 600 mg of elemental calcium by mouth daily with breakfast.    . Cholecalciferol (VITAMIN D3) 1000 units CAPS Take 1 capsule by mouth daily.    . citalopram (CELEXA) 10 MG tablet Take 10 mg by mouth daily.    . clopidogrel (PLAVIX) 75 MG tablet Take 1 tablet (75 mg total) by mouth daily. 90 tablet 3  . Multiple Vitamins-Minerals (PRESERVISION AREDS 2) CAPS Take 1 capsule by mouth daily.    Marland Kitchen PHENobarbital (LUMINAL) 64.8 MG tablet Take 1 tablet (64.8 mg total) by mouth 2 (two) times daily. 180 tablet 3  . phenytoin (DILANTIN) 100 MG ER capsule Take 200 mg by mouth 2 (two) times daily.     Marland Kitchen spironolactone (ALDACTONE) 25 MG tablet Take 1 tablet (25 mg total) by mouth daily. 90 tablet 3   No facility-administered medications prior to visit.      Allergies:   Penicillins   Social History   Socioeconomic History  . Marital status: Widowed    Spouse name: None  .  Number of children: 2  . Years of education: None  . Highest education level: None  Social Needs  . Financial resource strain: None  . Food insecurity - worry: None  . Food insecurity - inability: None  . Transportation needs - medical: None  . Transportation needs - non-medical: None  Occupational History  . Occupation: Retired  Tobacco Use  . Smoking status: Former Smoker    Packs/day: 2.00    Years: 15.00    Pack years: 30.00    Last attempt to quit: 01/15/1973    Years since quitting: 44.6  . Smokeless tobacco: Never Used  Substance and Sexual Activity  . Alcohol use: Yes    Comment: occasionally  . Drug use: No  .  Sexual activity: None  Other Topics Concern  . None  Social History Narrative   Went to [facility] after MI for rehab.     Family History:  The patient's family history includes Emphysema in his father and mother; Stroke in his mother.   ROS:   Please see the history of present illness.    He has had some difficulty with intermittent diarrhea since being placed on antibiotics shortly after pacemaker insertion.  Otherwise no complaints. All other systems reviewed and are negative.   PHYSICAL EXAM:   VS:  BP (!) 146/68   Pulse 69   Ht 6' (1.829 m)   Wt 181 lb 1.9 oz (82.2 kg)   BMI 24.56 kg/m    GEN: Well nourished, well developed, in no acute distress  HEENT: normal  Neck: no JVD, carotid bruits, or masses Cardiac: RRR; no murmurs, rubs, or gallops,no edema  Respiratory:  clear to auscultation bilaterally, normal work of breathing GI: soft, nontender, nondistended, + BS MS: no deformity or atrophy  Skin: warm and dry, no rash Neuro:  Alert and Oriented x 3, Strength and sensation are intact Psych: euthymic mood, full affect  Wt Readings from Last 3 Encounters:  08/14/17 181 lb 1.9 oz (82.2 kg)  08/07/17 182 lb (82.6 kg)  06/16/17 175 lb 3.2 oz (79.5 kg)      Studies/Labs Reviewed:   EKG:  EKG  Not repeated.  Recent  Labs: 11/11/2016: NT-Pro BNP 380 04/13/2017: ALT 21 06/12/2017: Hemoglobin 12.5; Platelets 316 06/16/2017: BUN 15; Creatinine, Ser 0.85; Potassium 4.0; Sodium 138   Lipid Panel    Component Value Date/Time   CHOL 164 04/13/2017 1225   TRIG 76 04/13/2017 1225   TRIG 85 01/20/2016 0958   HDL 74 04/13/2017 1225   HDL 69 01/20/2016 0958   CHOLHDL 2.2 04/13/2017 1225   CHOLHDL 3.3 04/27/2016 1133   VLDL 16 04/27/2016 1133   LDLCALC 75 04/13/2017 1225   LDLCALC 99 05/21/2014 1101    Additional studies/ records that were reviewed today include:   Holter monitor 05/24/17: Study Highlights      Abnormal study with evidence of first and second-degree AV block, occasionally 2: 1 and higher degree block  Bradycardia and AV block appeared to be asymptomatic.   Atrioventricular block with occasional episodes of brief high-grade AV block.     HEART RATE EPISODES Minimum HR: 28 BPM at 6:54:36 PM Maximum HR: 108 BPM at 5:34:30 PM(2) Average HR: 64 BPM   Plan is to discontinue any medication that can aggravate AV conduction. Referred to EP for consideration of pacemaker therapy.      ASSESSMENT:    1. AV block, Mobitz 2   2. Essential hypertension   3. Presence of permanent cardiac pacemaker   4. Hypercholesterolemia   5. Aortic atherosclerosis (HCC)      PLAN:  In order of problems listed above:  1. Status post permanent pacemaker insertion.  Apparent normal function but with some difficulty with pacemaker site healing. 2. Adequate blood pressure control currently 3. Presumed normal pacemaker function. 4. LDL target less than 70.  Plan clinical follow-up in 6-9 months.  He has relatively near follow-up with Dr. Rayann Heman scheduled for later this week.  Medication Adjustments/Labs and Tests Ordered: Current medicines are reviewed at length with the patient today.  Concerns regarding medicines are outlined above.  Medication changes, Labs and Tests ordered today are listed in  the Patient Instructions below. Patient Instructions  Medication Instructions:  Your physician recommends that you continue on your current medications as directed. Please refer to the Current Medication list given to you today.  Labwork: None  Testing/Procedures: None  Follow-Up: Your physician wants you to follow-up in: 6-9 months with Dr. Tamala Julian.  You will receive a reminder letter in the mail two months in advance. If you don't receive a letter, please call our office to schedule the follow-up appointment.   Any Other Special Instructions Will Be Listed Below (If Applicable).     If you need a refill on your cardiac medications before your next appointment, please call your pharmacy.      Signed, Sinclair Grooms, MD  08/14/2017 2:36 PM    [facility] [address], [place], [place]  [address] Phone: [phone]; Fax: [phone]

## 2017-08-14 ENCOUNTER — Ambulatory Visit (INDEPENDENT_AMBULATORY_CARE_PROVIDER_SITE_OTHER): Payer: Medicare Other | Admitting: Interventional Cardiology

## 2017-08-14 ENCOUNTER — Encounter: Payer: Self-pay | Admitting: Interventional Cardiology

## 2017-08-14 VITALS — BP 146/68 | HR 69 | Ht 72.0 in | Wt 181.1 lb

## 2017-08-14 DIAGNOSIS — E7800 Pure hypercholesterolemia, unspecified: Secondary | ICD-10-CM

## 2017-08-14 DIAGNOSIS — I700 Atherosclerosis of aorta: Secondary | ICD-10-CM | POA: Diagnosis not present

## 2017-08-14 DIAGNOSIS — I2510 Atherosclerotic heart disease of native coronary artery without angina pectoris: Secondary | ICD-10-CM | POA: Diagnosis not present

## 2017-08-14 DIAGNOSIS — I10 Essential (primary) hypertension: Secondary | ICD-10-CM

## 2017-08-14 DIAGNOSIS — I441 Atrioventricular block, second degree: Secondary | ICD-10-CM | POA: Diagnosis not present

## 2017-08-14 DIAGNOSIS — Z950 Presence of cardiac pacemaker: Secondary | ICD-10-CM | POA: Diagnosis not present

## 2017-08-14 NOTE — Patient Instructions (Signed)
Medication Instructions:  Your physician recommends that you continue on your current medications as directed. Please refer to the Current Medication list given to you today.   Labwork: None  Testing/Procedures: None  Follow-Up: Your physician wants you to follow-up in: 6-9 months with Dr. Smith. You will receive a reminder letter in the mail two months in advance. If you don't receive a letter, please call our office to schedule the follow-up appointment.   Any Other Special Instructions Will Be Listed Below (If Applicable).     If you need a refill on your cardiac medications before your next appointment, please call your pharmacy.   

## 2017-08-18 ENCOUNTER — Ambulatory Visit (INDEPENDENT_AMBULATORY_CARE_PROVIDER_SITE_OTHER): Payer: Medicare Other | Admitting: Family Medicine

## 2017-08-18 ENCOUNTER — Encounter: Payer: Self-pay | Admitting: Family Medicine

## 2017-08-18 VITALS — BP 134/68 | HR 61 | Temp 97.0°F | Ht 72.0 in | Wt 183.0 lb

## 2017-08-18 DIAGNOSIS — E7800 Pure hypercholesterolemia, unspecified: Secondary | ICD-10-CM | POA: Diagnosis not present

## 2017-08-18 DIAGNOSIS — G40909 Epilepsy, unspecified, not intractable, without status epilepticus: Secondary | ICD-10-CM

## 2017-08-18 DIAGNOSIS — K219 Gastro-esophageal reflux disease without esophagitis: Secondary | ICD-10-CM

## 2017-08-18 DIAGNOSIS — I2510 Atherosclerotic heart disease of native coronary artery without angina pectoris: Secondary | ICD-10-CM

## 2017-08-18 DIAGNOSIS — I10 Essential (primary) hypertension: Secondary | ICD-10-CM | POA: Diagnosis not present

## 2017-08-18 DIAGNOSIS — D692 Other nonthrombocytopenic purpura: Secondary | ICD-10-CM | POA: Diagnosis not present

## 2017-08-18 DIAGNOSIS — Z23 Encounter for immunization: Secondary | ICD-10-CM

## 2017-08-18 DIAGNOSIS — C61 Malignant neoplasm of prostate: Secondary | ICD-10-CM

## 2017-08-18 DIAGNOSIS — C679 Malignant neoplasm of bladder, unspecified: Secondary | ICD-10-CM | POA: Diagnosis not present

## 2017-08-18 DIAGNOSIS — E559 Vitamin D deficiency, unspecified: Secondary | ICD-10-CM | POA: Diagnosis not present

## 2017-08-18 DIAGNOSIS — R2681 Unsteadiness on feet: Secondary | ICD-10-CM | POA: Diagnosis not present

## 2017-08-18 MED ORDER — SPIRONOLACTONE 25 MG PO TABS: 25.0000 mg | ORAL_TABLET | Freq: Every day | ORAL | 3 refills | Status: DC

## 2017-08-18 MED ORDER — ATORVASTATIN CALCIUM 20 MG PO TABS: 20.0000 mg | ORAL_TABLET | Freq: Every day | ORAL | 3 refills | Status: DC

## 2017-08-18 MED ORDER — PHENOBARBITAL 64.8 MG PO TABS: 64.8000 mg | ORAL_TABLET | Freq: Two times a day (BID) | ORAL | 3 refills | Status: DC

## 2017-08-18 NOTE — Addendum Note (Signed)
Addended by: Zannie Cove on: 08/18/2017 04:56 PM   Modules accepted: Orders

## 2017-08-18 NOTE — Progress Notes (Signed)
Subjective:    Patient ID: Joshua [name], male    DOB: 04-30-1932, 83 y.o.   MRN: [id number]  HPI Pt here for follow up and management of chronic medical problems which includes hyperlipidemia and hypertension. He taking medication regularly.  The patient is doing well overall right now.  He is followed regularly by the cardiologist, the urologist and S.  He has had a heart attack and has been followed closely by the cardiologist because of his heart disease.  He also sees the urologist because of bladder cancer.  He recently had an intestinal illness with diarrhea but this is apparently cleared up and he is doing well with that.  He is requesting refill on his medicines.  He will also get his flu shot today.  His vital signs are stable and his weight is up a couple pounds.  The patient just recently saw the cardiologist and he gave him a good report and says he will not see him back for 6-9 months.  He sees the urologist regularly also because of the bladder cancer and the last pathology report was reported as good.  The patient is regaining his strength.  He is not had any more falls.  This may be attributed to the pacemaker.  The implantation of the pacemaker did develop a secondary infection and he was placed on antibiotics for this and he is off of the antibiotics and the skin infection is improving and this most likely played a role with his loose bowel movements which are also firming up now.  He denies any chest pain or additional shortness of breath and says his stools are getting better and he is not seen any blood in the stool or had any black tarry bowel movements.  He is passing his water without complaints today.  He seems to be a little bit more forgetful than usual but comes up with the answers asked of him during the visit.  He has his walking cane or stick with him.  We have encouraged him to use this from the past and I am glad to see he is using it.    Patient Active Problem List   Diagnosis Date Noted  . Second degree AV block 06/15/2017  . Aortic atherosclerosis ([place]) 06/03/2016  . General weakness   . Protein-calorie malnutrition, severe ([place])   . Bradycardia 06/02/2016  . GI bleed 06/01/2016  . 1st degree AV block   . AV block, Mobitz 2 04/27/2016  . Old MI (myocardial infarction) 04/27/2016  . Bladder cancer ([place]) 04/18/2016  . Vitamin D deficiency 10/15/2015  . Prostate cancer ([place])   . Hypercholesterolemia   . Essential hypertension   . Seizures ([place])   . GERD 08/22/2008  . COLONIC POLYPS, ADENOMATOUS, HX OF 08/22/2008   Outpatient Encounter Medications as of 08/18/2017  Medication Sig  . aspirin EC 81 MG tablet Take 1 tablet (81 mg total) by mouth daily.  Marland Kitchen atorvastatin (LIPITOR) 20 MG tablet Take 1 tablet (20 mg total) by mouth daily.  . calcium carbonate (OSCAL) 1500 (600 Ca) MG TABS tablet Take 600 mg of elemental calcium by mouth daily with breakfast.  . Cholecalciferol (VITAMIN D3) 1000 units CAPS Take 1 capsule by mouth daily.  . citalopram (CELEXA) 10 MG tablet Take 10 mg by mouth daily.  . clopidogrel (PLAVIX) 75 MG tablet Take 1 tablet (75 mg total) by mouth daily.  . Multiple Vitamins-Minerals (PRESERVISION AREDS 2) CAPS Take 1 capsule by mouth daily.  Marland Kitchen  PHENobarbital (LUMINAL) 64.8 MG tablet Take 1 tablet (64.8 mg total) by mouth 2 (two) times daily.  . phenytoin (DILANTIN) 100 MG ER capsule Take 200 mg by mouth 2 (two) times daily.   Marland Kitchen spironolactone (ALDACTONE) 25 MG tablet Take 1 tablet (25 mg total) by mouth daily.   No facility-administered encounter medications on file as of 08/18/2017.        Review of Systems  Constitutional: Negative.   HENT: Negative.   Eyes: Negative.   Respiratory: Negative.   Cardiovascular: Negative.   Gastrointestinal: Negative.   Endocrine: Negative.   Genitourinary: Negative.   Musculoskeletal: Negative.   Skin: Negative.   Allergic/Immunologic: Negative.   Neurological: Negative.     Hematological: Negative.   Psychiatric/Behavioral: Negative.        Objective:   Physical Exam  Constitutional: He is oriented to person, place, and time. He appears well-developed and well-nourished.  The patient is elderly appearing but alert and may be a little bit more slow with remembering things as quickly as he has been in the past.  He is improving from the pacemaker and the loose bowel movements.  HENT:  Head: Normocephalic and atraumatic.  Right Ear: External ear normal.  Left Ear: External ear normal.  Nose: Nose normal.  Mouth/Throat: Oropharynx is clear and moist. No oropharyngeal exudate.  Eyes: Conjunctivae and EOM are normal. Pupils are equal, round, and reactive to light. Right eye exhibits no discharge. Left eye exhibits no discharge. No scleral icterus.  Neck: Normal range of motion. Neck supple. No thyromegaly present.  Cardiovascular: Normal rate, regular rhythm and normal heart sounds.  No murmur heard. The heart is regular at 60/min  Pulmonary/Chest: Effort normal and breath sounds normal. No respiratory distress. He has no wheezes. He has no rales. He exhibits no tenderness.  Clear anteriorly and posteriorly  Abdominal: Soft. Bowel sounds are normal. He exhibits no mass. There is no tenderness. There is no rebound and no guarding.  Patient is wearing a depends but has no abdominal tenderness masses or bruits or organ enlargement  Genitourinary:  Genitourinary Comments: The patient sees Dr. Jeffie Pollock, his urologist regularly because of his bladder cancer  Musculoskeletal: He exhibits no edema.  Somewhat hesitant range of motion but improving  Lymphadenopathy:    He has no cervical adenopathy.  Neurological: He is alert and oriented to person, place, and time. He has normal reflexes. No cranial nerve deficit.  Skin: Skin is warm and dry. No rash noted.  Psychiatric: He has a normal mood and affect. His behavior is normal. Judgment and thought content normal.   Nursing note and vitals reviewed.  BP 134/68 (BP Location: Left Arm)   Pulse 61   Temp (!) 97 F (36.1 C) (Oral)   Ht 6' (1.829 m)   Wt 183 lb (83 kg)   BMI 24.82 kg/m         Assessment & Plan:  1. Hypercholesterolemia -Continue atorvastatin and aggressive therapeutic lifestyle changes as much as possible  2. Essential hypertension -The blood pressure is good today and he will continue with current treatment  3. Vitamin D deficiency -Continue with vitamin D replacement  4. ASCVD (arteriosclerotic cardiovascular disease) -Follow-up with cardiology as planned  5. Gastroesophageal reflux disease, esophagitis presence not specified -No complaints today with reflux symptoms and patient will continue with the ranitidine.  6. Seizure disorder ([place]) -No occurrence of any seizures but he will continue with his current treatment  7. Gait instability -Hopefully, will resume  physical therapy in January and he will continue to stabilize his gait by using his walking stick when out of the house.  8. Malignant neoplasm of urinary bladder, unspecified site [facility]) -Follow-up with urology as planned  9. Senile purpura ([facility]) -No additional bruising noted on exam today  10. Prostate cancer [facility]) -Follow-up with urology as planned   Meds ordered this encounter  Medications  . PHENobarbital (LUMINAL) 64.8 MG tablet    Sig: Take 1 tablet (64.8 mg total) by mouth 2 (two) times daily.    Dispense:  180 tablet    Refill:  3  . spironolactone (ALDACTONE) 25 MG tablet    Sig: Take 1 tablet (25 mg total) by mouth daily.    Dispense:  90 tablet    Refill:  3    Dose change  . atorvastatin (LIPITOR) 20 MG tablet    Sig: Take 1 tablet (20 mg total) by mouth daily.    Dispense:  90 tablet    Refill:  3   Patient Instructions                       Medicare Annual Wellness Visit  [facility] and the medical providers at [facility] strive to bring you the best  medical care.  In doing so we not only want to address your current medical conditions and concerns but also to detect new conditions early and prevent illness, disease and health-related problems.    Medicare offers a yearly Wellness Visit which allows our clinical staff to assess your need for preventative services including immunizations, lifestyle education, counseling to decrease risk of preventable diseases and screening for fall risk and other medical concerns.    This visit is provided free of charge (no copay) for all Medicare recipients. The clinical pharmacists at [facility] have begun to conduct these Wellness Visits which will also include a thorough review of all your medications.    As you primary medical provider recommend that you make an appointment for your Annual Wellness Visit if you have not done so already this year.  You may set up this appointment before you leave today or you may call back ([phone]) and schedule an appointment.  Please make sure when you call that you mention that you are scheduling your Annual Wellness Visit with the clinical pharmacist so that the appointment may be made for the proper length of time.     Continue current medications. Continue good therapeutic lifestyle changes which include good diet and exercise. Fall precautions discussed with patient. If an FOBT was given today- please return it to our front desk. If you are over 67 years old - you may need Prevnar 35 or the adult Pneumonia vaccine.  **Flu shots are available--- please call and schedule a FLU-CLINIC appointment**  After your visit with [place] today you will receive a survey in the mail or online from [employer] regarding your care with [place]. Please take a moment to fill this out. Your feedback is very important to [place] as you can help [place] better understand your patient needs as well as improve your experience and satisfaction. WE CARE ABOUT YOU!!!   Continue to  follow-up with cardiology and urology Continue to use your walking stick and did not be in a hurry with your movements so you will not fall As long as the flu epidemic is not bad, really consider restarting physical therapy in January Continue to drink  plenty of fluids and stay well-hydrated We will call with lab work results as soon as these results become available  Arrie Senate MD

## 2017-08-18 NOTE — Patient Instructions (Addendum)
Medicare Annual Wellness Visit  [facility] and the medical providers at [facility] strive to bring you the best medical care.  In doing so we not only want to address your current medical conditions and concerns but also to detect new conditions early and prevent illness, disease and health-related problems.    Medicare offers a yearly Wellness Visit which allows our clinical staff to assess your need for preventative services including immunizations, lifestyle education, counseling to decrease risk of preventable diseases and screening for fall risk and other medical concerns.    This visit is provided free of charge (no copay) for all Medicare recipients. The clinical pharmacists at [facility] have begun to conduct these Wellness Visits which will also include a thorough review of all your medications.    As you primary medical provider recommend that you make an appointment for your Annual Wellness Visit if you have not done so already this year.  You may set up this appointment before you leave today or you may call back ([phone]) and schedule an appointment.  Please make sure when you call that you mention that you are scheduling your Annual Wellness Visit with the clinical pharmacist so that the appointment may be made for the proper length of time.     Continue current medications. Continue good therapeutic lifestyle changes which include good diet and exercise. Fall precautions discussed with patient. If an FOBT was given today- please return it to our front desk. If you are over 48 years old - you may need Prevnar 81 or the adult Pneumonia vaccine.  **Flu shots are available--- please call and schedule a FLU-CLINIC appointment**  After your visit with [place] today you will receive a survey in the mail or online from [employer] regarding your care with [place]. Please take a moment to fill this out. Your feedback is very  important to [place] as you can help [place] better understand your patient needs as well as improve your experience and satisfaction. WE CARE ABOUT YOU!!!   Continue to follow-up with cardiology and urology Continue to use your walking stick and did not be in a hurry with your movements so you will not fall As long as the flu epidemic is not bad, really consider restarting physical therapy in January Continue to drink plenty of fluids and stay well-hydrated We will call with lab work results as soon as these results become available

## 2017-08-18 NOTE — Addendum Note (Signed)
Addended by: Zannie Cove on: 08/18/2017 03:58 PM   Modules accepted: Orders

## 2017-08-21 LAB — CBC WITH DIFFERENTIAL/PLATELET
BASOS: 1 %
Basophils Absolute: 0.1 10*3/uL (ref 0.0–0.2)
EOS (ABSOLUTE): 0.7 10*3/uL — ABNORMAL HIGH (ref 0.0–0.4)
EOS: 7 %
HEMOGLOBIN: 12.8 g/dL — AB (ref 13.0–17.7)
Hematocrit: 37.5 % (ref 37.5–51.0)
IMMATURE GRANS (ABS): 0.1 10*3/uL (ref 0.0–0.1)
Immature Granulocytes: 1 %
LYMPHS: 27 %
Lymphocytes Absolute: 2.9 10*3/uL (ref 0.7–3.1)
MCH: 30.6 pg (ref 26.6–33.0)
MCHC: 34.1 g/dL (ref 31.5–35.7)
MCV: 90 fL (ref 79–97)
MONOCYTES: 7 %
Monocytes Absolute: 0.7 10*3/uL (ref 0.1–0.9)
NEUTROS ABS: 6.2 10*3/uL (ref 1.4–7.0)
Neutrophils: 57 %
Platelets: 338 10*3/uL (ref 150–379)
RBC: 4.18 x10E6/uL (ref 4.14–5.80)
RDW: 15 % (ref 12.3–15.4)
WBC: 10.6 10*3/uL (ref 3.4–10.8)

## 2017-08-21 LAB — HEPATIC FUNCTION PANEL
ALK PHOS: 148 IU/L — AB (ref 39–117)
ALT: 23 IU/L (ref 0–44)
AST: 29 IU/L (ref 0–40)
Albumin: 4.3 g/dL (ref 3.5–4.7)
BILIRUBIN, DIRECT: 0.13 mg/dL (ref 0.00–0.40)
Bilirubin Total: 0.3 mg/dL (ref 0.0–1.2)
TOTAL PROTEIN: 6.8 g/dL (ref 6.0–8.5)

## 2017-08-21 LAB — BMP8+EGFR
BUN/Creatinine Ratio: 18 (ref 10–24)
BUN: 17 mg/dL (ref 8–27)
CALCIUM: 9.4 mg/dL (ref 8.6–10.2)
CO2: 26 mmol/L (ref 20–29)
Chloride: 103 mmol/L (ref 96–106)
Creatinine, Ser: 0.92 mg/dL (ref 0.76–1.27)
GFR, EST AFRICAN AMERICAN: 89 mL/min/{1.73_m2} (ref >59)
GFR, EST NON AFRICAN AMERICAN: 77 mL/min/{1.73_m2} (ref >59)
Glucose: 88 mg/dL (ref 65–99)
Potassium: 4.4 mmol/L (ref 3.5–5.2)
Sodium: 142 mmol/L (ref 134–144)

## 2017-08-21 LAB — PHENYTOIN LEVEL, FREE AND TOTAL
PHENYTOIN, SERUM: 7.1 ug/mL — AB (ref 10.0–20.0)
Phenytoin, Free: 0.5 ug/mL — ABNORMAL LOW (ref 1.0–2.0)

## 2017-08-21 LAB — PHENOBARBITAL LEVEL: PHENOBARBITAL, SERUM: 21 ug/mL (ref 15–40)

## 2017-08-21 LAB — LIPID PANEL
Chol/HDL Ratio: 2 ratio (ref 0.0–5.0)
Cholesterol, Total: 151 mg/dL (ref 100–199)
HDL: 76 mg/dL (ref >39)
LDL Calculated: 64 mg/dL (ref 0–99)
Triglycerides: 54 mg/dL (ref 0–149)
VLDL CHOLESTEROL CAL: 11 mg/dL (ref 5–40)

## 2017-08-21 LAB — VITAMIN D 25 HYDROXY (VIT D DEFICIENCY, FRACTURES): VIT D 25 HYDROXY: 39.7 ng/mL (ref 30.0–100.0)

## 2017-08-22 ENCOUNTER — Telehealth: Payer: Self-pay | Admitting: Family Medicine

## 2017-08-22 NOTE — Telephone Encounter (Signed)
Pt aware of labs  

## 2017-08-25 DIAGNOSIS — L820 Inflamed seborrheic keratosis: Secondary | ICD-10-CM | POA: Diagnosis not present

## 2017-08-25 DIAGNOSIS — D1801 Hemangioma of skin and subcutaneous tissue: Secondary | ICD-10-CM | POA: Diagnosis not present

## 2017-08-25 DIAGNOSIS — L570 Actinic keratosis: Secondary | ICD-10-CM | POA: Diagnosis not present

## 2017-08-25 DIAGNOSIS — L821 Other seborrheic keratosis: Secondary | ICD-10-CM | POA: Diagnosis not present

## 2017-08-25 DIAGNOSIS — D225 Melanocytic nevi of trunk: Secondary | ICD-10-CM | POA: Diagnosis not present

## 2017-08-25 DIAGNOSIS — D485 Neoplasm of uncertain behavior of skin: Secondary | ICD-10-CM | POA: Diagnosis not present

## 2017-09-14 ENCOUNTER — Ambulatory Visit (INDEPENDENT_AMBULATORY_CARE_PROVIDER_SITE_OTHER): Payer: Self-pay | Admitting: *Deleted

## 2017-09-14 DIAGNOSIS — I441 Atrioventricular block, second degree: Secondary | ICD-10-CM

## 2017-09-14 NOTE — Progress Notes (Signed)
Remote pacemaker transmission.   

## 2017-09-15 ENCOUNTER — Encounter: Payer: Self-pay | Admitting: Cardiology

## 2017-09-16 LAB — CUP PACEART REMOTE DEVICE CHECK
Brady Statistic AP VP Percent: 41.87 %
Brady Statistic AS VP Percent: 56.21 %
Brady Statistic RA Percent Paced: 41.92 %
Brady Statistic RV Percent Paced: 98.07 %
Implantable Lead Implant Date: 20180927
Implantable Lead Location: 753860
Implantable Lead Model: 5076
Implantable Lead Model: 5076
Lead Channel Impedance Value: 304 Ohm
Lead Channel Impedance Value: 399 Ohm
Lead Channel Impedance Value: 532 Ohm
Lead Channel Pacing Threshold Pulse Width: 0.4 ms
Lead Channel Pacing Threshold Pulse Width: 0.4 ms
Lead Channel Sensing Intrinsic Amplitude: 9.375 mV
Lead Channel Setting Pacing Amplitude: 3.5 V
Lead Channel Setting Pacing Pulse Width: 0.4 ms
MDC IDC LEAD IMPLANT DT: 20180927
MDC IDC LEAD LOCATION: 753859
MDC IDC MSMT BATTERY REMAINING LONGEVITY: 106 mo
MDC IDC MSMT BATTERY VOLTAGE: 3.15 V
MDC IDC MSMT LEADCHNL RA IMPEDANCE VALUE: 380 Ohm
MDC IDC MSMT LEADCHNL RA PACING THRESHOLD AMPLITUDE: 0.5 V
MDC IDC MSMT LEADCHNL RA SENSING INTR AMPL: 0.75 mV
MDC IDC MSMT LEADCHNL RA SENSING INTR AMPL: 0.75 mV
MDC IDC MSMT LEADCHNL RV PACING THRESHOLD AMPLITUDE: 0.625 V
MDC IDC MSMT LEADCHNL RV SENSING INTR AMPL: 9.375 mV
MDC IDC PG IMPLANT DT: 20180927
MDC IDC SESS DTM: 20181227192241
MDC IDC SET LEADCHNL RV PACING AMPLITUDE: 3.25 V
MDC IDC SET LEADCHNL RV SENSING SENSITIVITY: 0.9 mV
MDC IDC STAT BRADY AP VS PERCENT: 0.04 %
MDC IDC STAT BRADY AS VS PERCENT: 1.88 %

## 2017-09-18 ENCOUNTER — Ambulatory Visit (INDEPENDENT_AMBULATORY_CARE_PROVIDER_SITE_OTHER): Payer: Medicare Other | Admitting: Internal Medicine

## 2017-09-18 ENCOUNTER — Encounter: Payer: Self-pay | Admitting: Internal Medicine

## 2017-09-18 VITALS — BP 130/70 | HR 70 | Ht 72.0 in | Wt 185.0 lb

## 2017-09-18 DIAGNOSIS — I10 Essential (primary) hypertension: Secondary | ICD-10-CM

## 2017-09-18 DIAGNOSIS — I441 Atrioventricular block, second degree: Secondary | ICD-10-CM

## 2017-09-18 DIAGNOSIS — I2510 Atherosclerotic heart disease of native coronary artery without angina pectoris: Secondary | ICD-10-CM

## 2017-09-18 LAB — CUP PACEART INCLINIC DEVICE CHECK
Brady Statistic AP VP Percent: 42.74 %
Brady Statistic AP VS Percent: 0.04 %
Brady Statistic AS VP Percent: 55.38 %
Brady Statistic RV Percent Paced: 98.12 %
Date Time Interrogation Session: 20181231132744
Implantable Lead Implant Date: 20180927
Implantable Lead Location: 753859
Implantable Lead Model: 5076
Implantable Lead Model: 5076
Lead Channel Impedance Value: 399 Ohm
Lead Channel Sensing Intrinsic Amplitude: 10.75 mV
Lead Channel Sensing Intrinsic Amplitude: 12 mV
Lead Channel Sensing Intrinsic Amplitude: 3.125 mV
Lead Channel Setting Pacing Amplitude: 2 V
Lead Channel Setting Pacing Amplitude: 2.5 V
Lead Channel Setting Pacing Pulse Width: 0.4 ms
Lead Channel Setting Sensing Sensitivity: 0.9 mV
MDC IDC LEAD IMPLANT DT: 20180927
MDC IDC LEAD LOCATION: 753860
MDC IDC MSMT BATTERY REMAINING LONGEVITY: 135 mo
MDC IDC MSMT BATTERY VOLTAGE: 3.15 V
MDC IDC MSMT LEADCHNL RA IMPEDANCE VALUE: 304 Ohm
MDC IDC MSMT LEADCHNL RA IMPEDANCE VALUE: 380 Ohm
MDC IDC MSMT LEADCHNL RA PACING THRESHOLD AMPLITUDE: 0.5 V
MDC IDC MSMT LEADCHNL RA PACING THRESHOLD PULSEWIDTH: 0.4 ms
MDC IDC MSMT LEADCHNL RA SENSING INTR AMPL: 1.125 mV
MDC IDC MSMT LEADCHNL RV IMPEDANCE VALUE: 551 Ohm
MDC IDC MSMT LEADCHNL RV PACING THRESHOLD AMPLITUDE: 0.75 V
MDC IDC MSMT LEADCHNL RV PACING THRESHOLD PULSEWIDTH: 0.4 ms
MDC IDC PG IMPLANT DT: 20180927
MDC IDC STAT BRADY AS VS PERCENT: 1.84 %
MDC IDC STAT BRADY RA PERCENT PACED: 42.79 %

## 2017-09-18 NOTE — Patient Instructions (Addendum)
Medication Instructions: Your physician recommends that you continue on your current medications as directed. Please refer to the Current Medication list given to you today.  Labwork: None Ordered  Procedures/Testing: None Ordered  Follow-Up: Remote monitoring is used to monitor your Pacemaker from home. This monitoring reduces the number of office visits required to check your device to one time per year. It allows [place] to keep an eye on the functioning of your device to ensure it is working properly. You are scheduled for a device check from home on 12/14/17. You may send your transmission at any time that day. If you have a wireless device, the transmission will be sent automatically. After your physician reviews your transmission, you will receive a postcard with your next transmission date.  Your physician wants you to follow-up in: 1 YEAR with Chanetta Marshall, NP. You will receive a reminder letter in the mail two months in advance. If you don't receive a letter, please call our office to schedule the follow-up appointment.    If you need a refill on your cardiac medications before your next appointment, please call your pharmacy.

## 2017-09-18 NOTE — Progress Notes (Signed)
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  Since last being seen in our clinic, the patient reports doing very well.  Today, he denies symptoms of palpitations, chest pain, shortness of breath,  lower extremity edema, dizziness, presyncope, or syncope.  The patient is otherwise without complaint today.   Past Medical History:  Diagnosis Date  . 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 Arthritis   . Bilateral lower extremity edema   . Bladder tumor   . CAD (coronary artery disease) cardiologist--  dr Daneen Schick   a. 04/27/2016 PCI with DES to RCA with 50% ostial to 60% segmental mid to distal left main, and 90% thrombus filled ostial to proximal circumflex, with distal right coronary filled by collaterals from left-to-right  . COPD with emphysema ([place])   . Dyspnea on exertion   . ED (erectile dysfunction)   . First degree AV block   . GERD (gastroesophageal reflux disease)   . History of adenomatous polyp of colon    09-22-2008  . History of bladder cancer    s/p  resection bladder tumor 04-19-2016  non-invasive low-grade urothelial carcinoma  . History of prostate cancer urologist-  dr Jeffie Pollock-- last PSA 0.02 (summer 2017)   dx 02/ 2013-- Stage T2a, Gleason 7, PSA 1.58--  s/p  radioactive prostate seed implants 01-19-2012  . History of ST elevation myocardial infarction (STEMI)    04-27-2016  . Hyperlipidemia   . Hypertension   . Presence of permanent cardiac pacemaker   . S/P drug eluting coronary stent placement    04-27-2016   . Second degree Mobitz I AV block    occasional w/ first degree heart block  per cardiologist note by dr Daneen Schick  . Seizures ([place]) per pt son -- no seizure's since 1980   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  arteriorvenous cerebral malformation s/p  craniotomy  1980  . Vitamin D deficiency    Past Surgical History:  Procedure Laterality Date  . APPENDECTOMY  2011  . BLEPHAROPLASTY Bilateral revision 02-17-2016  . CARDIAC CATHETERIZATION N/A 04/27/2016   Procedure: Left Heart Cath and Coronary Angiography;  Surgeon: Belva Crome, MD;  Location: [facility];  Service: Cardiovascular;  Laterality: N/A;  acute inferior wall STEMI ;  ostCx to midCx 90%,  ost1st Mrg to 1st Mrg 50%,  mid to distal RCA 100%,  pRCA 80%,  LVEF 45-50% w/ inferobasal akinesis  . CARDIAC CATHETERIZATION N/A 04/27/2016   Procedure: Temporary Pacemaker;  Surgeon: Belva Crome, MD;  Location: [facility];  Service: Cardiovascular;  Laterality: N/A;  mobitz 2  second degree HB w/ heart rate 29bpm  . CARDIAC CATHETERIZATION N/A 04/27/2016   Procedure: Coronary Stent Intervention;  Surgeon: Belva Crome, MD;  Location: [facility];  Service: Cardiovascular;  Laterality: N/A; DES x1 to  Proximal RCA;  DES x1 to Mid RCA  . CATARACT EXTRACTION W/ INTRAOCULAR LENS  IMPLANT, BILATERAL  2016  approx.  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 CYSTOSCOPY  01/19/2012   Procedure: CYSTOSCOPY;  Surgeon: Malka So, MD;  [facility];  Service: Urology;;  no seeds found in bladder  . CYSTOSCOPY W/ RETROGRADES Bilateral 04/19/2016   Procedure: CYSTOSCOPY  WITH BILATERAL RETROGRADE PYELOGRAM TRANSURETHRAL RESECTION OF BLADDER TUMOR ;  Surgeon: [name], MD;  Location: [facility];  Service: Urology;  Laterality: Bilateral;  . CYSTOSCOPY WITH BIOPSY N/A 11/10/2016   Procedure: CYSTOSCOPY WITH BIOPSY AND FULGURATION;  Surgeon: [name], MD;  Location[facility];  Service: Urology;  Laterality: N/A;  . INSERT / REPLACE / REMOVE PACEMAKER  06/15/2017  . PACEMAKER IMPLANT N/A 06/15/2017   Procedure: Pacemaker Implant;  Surgeon: [name], MD;  Location: [facility];  Service:  Cardiovascular;  Laterality: N/A;  . RADIOACTIVE SEED IMPLANT  01/19/2012   Procedure: RADIOACTIVE SEED IMPLANT;  Surgeon: Malka So, MD;  [facility];  Service: Urology;  Laterality: N/A;  68 seeds implanted  . TRANSTHORACIC ECHOCARDIOGRAM  06/02/2016   ef 60-65%/  trivial MR/  mild TR    ROS- all systems are reviewed and negative except as per HPI above  Current Outpatient Medications  Medication Sig Dispense Refill  . aspirin EC 81 MG tablet Take 1 tablet (81 mg total) by mouth daily. 90 tablet 3  . atorvastatin (LIPITOR) 20 MG tablet Take 1 tablet (20 mg total) by mouth daily. 90 tablet 3  . calcium carbonate (OSCAL) 1500 (600 Ca) MG TABS tablet Take 600 mg of elemental calcium by mouth daily with breakfast.    . Cholecalciferol (VITAMIN D3) 1000 units CAPS Take 1 capsule by mouth daily.    . citalopram (CELEXA) 10 MG tablet Take 10 mg by mouth daily.    . clopidogrel (PLAVIX) 75 MG tablet Take 1 tablet (75 mg total) by mouth daily. 90 tablet 3  . Multiple Vitamins-Minerals (PRESERVISION AREDS 2) CAPS Take 1 capsule by mouth daily.    Marland Kitchen PHENobarbital (LUMINAL) 64.8 MG tablet Take 1 tablet (64.8 mg total) by mouth 2 (two) times daily. 180 tablet 3  . phenytoin (DILANTIN) 100 MG ER capsule Take 200 mg by mouth 2 (two) times daily.     Marland Kitchen spironolactone (ALDACTONE) 25 MG tablet Take 1 tablet (25 mg total) by mouth daily. 90 tablet 3   No current facility-administered medications for this visit.     Physical Exam: Vitals:   09/18/17 1255  BP: 130/70  Pulse: 70  Weight: 185 lb (83.9 kg)  Height: 6' (1.829 m)    GEN- The patient is well appearing, alert and oriented x 3 today.   Head- normocephalic, atraumatic Eyes-  Sclera clear, conjunctiva pink Ears- hearing intact Oropharynx- clear Lungs- Clear to ausculation bilaterally, normal work of breathing Chest- pacemaker pocket is well healed,  There was a very tiny retained stitch over the lateral incision  border which I removed today.  No drainage, warmth, redness, fluctuance, etc.  Looks very good otherwise. Heart- Regular rate and rhythm, no murmurs, rubs or gallops, PMI not laterally displaced GI- soft, NT, ND, + BS Extremities- no clubbing, cyanosis, or edema  Pacemaker interrogation- reviewed in detail today,  See PACEART report  ekg tracing ordered today is personally reviewed and shows sinus with V pacing  Assessment and Plan:  1. Symptomatic second degree heart block Normal pacemaker function See Pace Art report No changes today  2. HTN Stable No change required today  [facility] Return to see EP NP in a year  [name] MD, [facility] 09/18/2017 12:57 PM

## 2017-09-21 DIAGNOSIS — Z8546 Personal history of malignant neoplasm of prostate: Secondary | ICD-10-CM | POA: Diagnosis not present

## 2017-09-27 DIAGNOSIS — Z8546 Personal history of malignant neoplasm of prostate: Secondary | ICD-10-CM | POA: Diagnosis not present

## 2017-09-27 DIAGNOSIS — Z8551 Personal history of malignant neoplasm of bladder: Secondary | ICD-10-CM | POA: Diagnosis not present

## 2017-09-29 ENCOUNTER — Other Ambulatory Visit: Payer: Self-pay | Admitting: Family Medicine

## 2017-09-29 MED ORDER — PHENYTOIN SODIUM EXTENDED 100 MG PO CAPS: 200.0000 mg | ORAL_CAPSULE | Freq: Two times a day (BID) | ORAL | 2 refills | Status: DC

## 2017-09-29 NOTE — Telephone Encounter (Signed)
What is the name of the medication? Dilantin 100mg   Have you contacted your pharmacy to request a refill? No   Which pharmacy would you like this sent to? [facility] mail order with 3 mth   Patient notified that their request is being sent to the clinical staff for review and that they should receive a call once it is complete. If they do not receive a call within 24 hours they can check with their pharmacy or our office.

## 2017-10-02 DIAGNOSIS — H31002 Unspecified chorioretinal scars, left eye: Secondary | ICD-10-CM | POA: Diagnosis not present

## 2017-10-02 DIAGNOSIS — H52203 Unspecified astigmatism, bilateral: Secondary | ICD-10-CM | POA: Diagnosis not present

## 2017-10-02 DIAGNOSIS — H353111 Nonexudative age-related macular degeneration, right eye, early dry stage: Secondary | ICD-10-CM | POA: Diagnosis not present

## 2017-10-02 DIAGNOSIS — H353221 Exudative age-related macular degeneration, left eye, with active choroidal neovascularization: Secondary | ICD-10-CM | POA: Diagnosis not present

## 2017-10-03 DIAGNOSIS — H353221 Exudative age-related macular degeneration, left eye, with active choroidal neovascularization: Secondary | ICD-10-CM | POA: Diagnosis not present

## 2017-10-03 DIAGNOSIS — H353112 Nonexudative age-related macular degeneration, right eye, intermediate dry stage: Secondary | ICD-10-CM | POA: Diagnosis not present

## 2017-10-05 DIAGNOSIS — H353221 Exudative age-related macular degeneration, left eye, with active choroidal neovascularization: Secondary | ICD-10-CM | POA: Diagnosis not present

## 2017-10-06 ENCOUNTER — Telehealth: Payer: Self-pay | Admitting: *Deleted

## 2017-10-06 MED ORDER — PHENYTOIN SODIUM EXTENDED 100 MG PO CAPS: 200.0000 mg | ORAL_CAPSULE | Freq: Two times a day (BID) | ORAL | 2 refills | Status: DC

## 2017-10-06 NOTE — Telephone Encounter (Signed)
Refill for Phenytoin was sent to [facility] refills to [facility] Patient will pickup enough from [facility] until his mail order is received

## 2017-10-31 ENCOUNTER — Other Ambulatory Visit: Payer: Self-pay | Admitting: Family Medicine

## 2017-10-31 NOTE — Telephone Encounter (Signed)
Next OV 12/20/17

## 2017-11-02 DIAGNOSIS — H353221 Exudative age-related macular degeneration, left eye, with active choroidal neovascularization: Secondary | ICD-10-CM | POA: Diagnosis not present

## 2017-11-14 DIAGNOSIS — C44629 Squamous cell carcinoma of skin of left upper limb, including shoulder: Secondary | ICD-10-CM | POA: Diagnosis not present

## 2017-11-14 DIAGNOSIS — L570 Actinic keratosis: Secondary | ICD-10-CM | POA: Diagnosis not present

## 2017-11-14 DIAGNOSIS — D485 Neoplasm of uncertain behavior of skin: Secondary | ICD-10-CM | POA: Diagnosis not present

## 2017-11-17 DIAGNOSIS — C44629 Squamous cell carcinoma of skin of left upper limb, including shoulder: Secondary | ICD-10-CM | POA: Diagnosis not present

## 2017-11-30 DIAGNOSIS — H353221 Exudative age-related macular degeneration, left eye, with active choroidal neovascularization: Secondary | ICD-10-CM | POA: Diagnosis not present

## 2017-12-12 ENCOUNTER — Encounter: Payer: Self-pay | Admitting: Family Medicine

## 2017-12-12 ENCOUNTER — Ambulatory Visit (INDEPENDENT_AMBULATORY_CARE_PROVIDER_SITE_OTHER): Payer: Medicare Other | Admitting: Family Medicine

## 2017-12-12 VITALS — BP 145/80 | HR 63 | Temp 97.3°F | Ht 72.0 in | Wt 184.0 lb

## 2017-12-12 DIAGNOSIS — J069 Acute upper respiratory infection, unspecified: Secondary | ICD-10-CM

## 2017-12-12 DIAGNOSIS — D692 Other nonthrombocytopenic purpura: Secondary | ICD-10-CM | POA: Diagnosis not present

## 2017-12-12 DIAGNOSIS — E559 Vitamin D deficiency, unspecified: Secondary | ICD-10-CM | POA: Diagnosis not present

## 2017-12-12 DIAGNOSIS — C679 Malignant neoplasm of bladder, unspecified: Secondary | ICD-10-CM | POA: Diagnosis not present

## 2017-12-12 DIAGNOSIS — K219 Gastro-esophageal reflux disease without esophagitis: Secondary | ICD-10-CM | POA: Diagnosis not present

## 2017-12-12 DIAGNOSIS — R569 Unspecified convulsions: Secondary | ICD-10-CM | POA: Diagnosis not present

## 2017-12-12 DIAGNOSIS — I10 Essential (primary) hypertension: Secondary | ICD-10-CM | POA: Diagnosis not present

## 2017-12-12 DIAGNOSIS — I2510 Atherosclerotic heart disease of native coronary artery without angina pectoris: Secondary | ICD-10-CM

## 2017-12-12 DIAGNOSIS — C61 Malignant neoplasm of prostate: Secondary | ICD-10-CM | POA: Diagnosis not present

## 2017-12-12 DIAGNOSIS — E7800 Pure hypercholesterolemia, unspecified: Secondary | ICD-10-CM | POA: Diagnosis not present

## 2017-12-12 DIAGNOSIS — G40909 Epilepsy, unspecified, not intractable, without status epilepticus: Secondary | ICD-10-CM | POA: Diagnosis not present

## 2017-12-12 NOTE — Patient Instructions (Addendum)
Medicare Annual Wellness Visit  [facility] and the medical providers at [facility] strive to bring you the best medical care.  In doing so we not only want to address your current medical conditions and concerns but also to detect new conditions early and prevent illness, disease and health-related problems.    Medicare offers a yearly Wellness Visit which allows our clinical staff to assess your need for preventative services including immunizations, lifestyle education, counseling to decrease risk of preventable diseases and screening for fall risk and other medical concerns.    This visit is provided free of charge (no copay) for all Medicare recipients. The clinical pharmacists at [facility] have begun to conduct these Wellness Visits which will also include a thorough review of all your medications.    As you primary medical provider recommend that you make an appointment for your Annual Wellness Visit if you have not done so already this year.  You may set up this appointment before you leave today or you may call back ([phone]) and schedule an appointment.  Please make sure when you call that you mention that you are scheduling your Annual Wellness Visit with the clinical pharmacist so that the appointment may be made for the proper length of time.     Continue current medications. Continue good therapeutic lifestyle changes which include good diet and exercise. Fall precautions discussed with patient. If an FOBT was given today- please return it to our front desk. If you are over 66 years old - you may need Prevnar 57 or the adult Pneumonia vaccine.  **Flu shots are available--- please call and schedule a FLU-CLINIC appointment**  After your visit with [place] today you will receive a survey in the mail or online from [employer] regarding your care with [place]. Please take a moment to fill this out. Your feedback is very  important to [place] as you can help [place] better understand your patient needs as well as improve your experience and satisfaction. WE CARE ABOUT YOU!!!   Continue with physical therapy so that you will not be we can have an accident or fall Continue follow-up with cardiology and urology Drink plenty of fluids and stay well-hydrated The probiotic align can be purchased at [facility] and you can get the generic version and take 1 daily. Continue to drink plenty of fluids and stay well-hydrated Continue with Mucinex gargle with warm salty water and use nasal saline and avoid irritating environments as much as possible If any worsening of your upper respiratory viral syndrome please call us back and we will call in an antibiotic if needed.

## 2017-12-12 NOTE — Progress Notes (Signed)
Subjective:    Patient ID: Joshua [name], male    DOB: 10/21/1931, 84 y.o.   MRN: [id number]  HPI Pt here for follow up and management of chronic medical problems which includes hyperlipidemia and hypertension. He is taking medication regularly.  The patient is doing well overall.  He continues [place] get physical [place].  He complains today of sinus congestion and drainage.  He will get lab work today.  He continues to be followed by Dr. Daneen Schick and by Dr. [name] the urologist for prostate cancer and bladder cancer.  His vital signs are stable.  He is taking atorvastatin and vitamin D 3 Celexa and he continues to take phenobarbital and Dilantin for his remote seizure disorder from many years ago.  He is on Plavix for circulation and Spironolactone for blood pressure and fluid control.  Sees Dr. Tamala Julian, the cardiologist every 6-9 months.  He says that he was very pleased with his last visit with him.  He sees the urologist because of bladder cancer and prostate cancer every 3-4 months and has cystoscopies because of the bladder cancer.  Today the patient denies any chest pain or shortness of breath anymore than usual other than the head congestion and drainage she is having.  He says the drainage is clear in color and he has not been running any fever.  He has some upper chest congestion.  He denies any trouble with his intestinal tract other than some soft stools.  He is not seen any blood in the stool or had any black tarry bowel movements.  He is passing his water without problems.    Patient Active Problem List   Diagnosis Date Noted  . Second degree AV block 06/15/2017  . Aortic atherosclerosis ([place]) 06/03/2016  . General weakness   . Protein-calorie malnutrition, severe ([place])   . Bradycardia 06/02/2016  . GI bleed 06/01/2016  . 1st degree AV block   . AV block, Mobitz 2 04/27/2016  . Old MI (myocardial infarction) 04/27/2016  . Bladder cancer ([place]) 04/18/2016  . Vitamin D  deficiency 10/15/2015  . Prostate cancer ([place])   . Hypercholesterolemia   . Essential hypertension   . Seizures ([place])   . GERD 08/22/2008  . COLONIC POLYPS, ADENOMATOUS, HX OF 08/22/2008   Outpatient Encounter Medications as of 12/12/2017  Medication Sig  . aspirin EC 81 MG tablet Take 1 tablet (81 mg total) by mouth daily.  Marland Kitchen atorvastatin (LIPITOR) 20 MG tablet Take 1 tablet (20 mg total) by mouth daily.  . calcium carbonate (OSCAL) 1500 (600 Ca) MG TABS tablet Take 600 mg of elemental calcium by mouth daily with breakfast.  . Cholecalciferol (VITAMIN D3) 1000 units CAPS Take 1 capsule by mouth daily.  . citalopram (CELEXA) 10 MG tablet Take 10 mg by mouth daily.  . citalopram (CELEXA) 20 MG tablet TAKE 1 TABLET BY MOUTH DAILY  . clopidogrel (PLAVIX) 75 MG tablet Take 1 tablet (75 mg total) by mouth daily.  . Multiple Vitamins-Minerals (PRESERVISION AREDS 2) CAPS Take 1 capsule by mouth daily.  Marland Kitchen PHENobarbital (LUMINAL) 64.8 MG tablet Take 1 tablet (64.8 mg total) by mouth 2 (two) times daily.  . phenytoin (DILANTIN) 100 MG ER capsule Take 2 capsules (200 mg total) by mouth 2 (two) times daily.  Marland Kitchen spironolactone (ALDACTONE) 25 MG tablet Take 1 tablet (25 mg total) by mouth daily.   No facility-administered encounter medications on file as of 12/12/2017.  Review of Systems  Constitutional: Negative.   HENT: Negative.   Eyes: Negative.   Respiratory: Negative.   Cardiovascular: Negative.   Gastrointestinal: Negative.   Endocrine: Negative.   Genitourinary: Negative.   Musculoskeletal: Negative.   Skin: Negative.   Allergic/Immunologic: Negative.   Neurological: Negative.   Hematological: Negative.   Psychiatric/Behavioral: Negative.        Objective:   Physical Exam  Constitutional: He is oriented to person, place, and time. He appears well-developed and well-nourished.  Patient is pleasant and relaxed and other than the upper respiratory congestion is doing well and  following up with his specialists as planned  HENT:  Head: Normocephalic and atraumatic.  Right Ear: External ear normal.  Left Ear: External ear normal.  Mouth/Throat: Oropharynx is clear and moist. No oropharyngeal exudate.  Slight nasal congestion bilaterally and throat was clear.  Eyes: Pupils are equal, round, and reactive to light. Conjunctivae and EOM are normal. Right eye exhibits no discharge. Left eye exhibits no discharge. No scleral icterus.  Neck: Normal range of motion. Neck supple. No thyromegaly present.  No anterior cervical adenopathy bruits or thyromegaly  Cardiovascular: Normal rate, regular rhythm, normal heart sounds and intact distal pulses.  No murmur heard. The heart is regular at 72/min good pedal pulses bilaterally  Pulmonary/Chest: Effort normal and breath sounds normal. No respiratory distress. He has no wheezes. He has no rales. He exhibits no tenderness.  No axillary adenopathy and chest is clear anteriorly and posteriorly with a dry cough.  There is some basilar atelectasis on the left base posteriorly.  Abdominal: Soft. Bowel sounds are normal. He exhibits no mass. There is no tenderness. There is no rebound and no guarding.  No abdominal tenderness masses organ enlargement or bruits  Genitourinary:  Genitourinary Comments: Regular follow-up with urology every 3-4 months because of prostate and bladder cancer  Musculoskeletal: He exhibits no edema.  Patient uses a cane for ambulation and is somewhat kyphotic  Lymphadenopathy:    He has no cervical adenopathy.  Neurological: He is alert and oriented to person, place, and time. He has normal reflexes. No cranial nerve deficit.  Skin: Skin is warm and dry. No rash noted.  Psychiatric: He has a normal mood and affect. His behavior is normal. Judgment and thought content normal.  Nursing note and vitals reviewed.   BP (!) 145/80 (BP Location: Left Arm)   Pulse 63   Temp (!) 97.3 F (36.3 C) (Oral)   Ht 6'  (1.829 m)   Wt 184 lb (83.5 kg)   BMI 24.95 kg/m        Assessment & Plan:  1. Hypercholesterolemia -Continue with aggressive therapeutic lifestyle changes and statin therapy and follow-up with cardiology as planned - CBC with Differential/Platelet - Lipid panel  2. Essential hypertension -Systolic blood pressure is slightly elevated but no change in treatment - BMP8+EGFR - CBC with Differential/Platelet - Hepatic function panel  3. Vitamin D deficiency -Continue with vitamin D replacement pending results of lab work - CBC with Differential/Platelet - VITAMIN D 25 Hydroxy (Vit-D Deficiency, Fractures)  4. ASCVD (arteriosclerotic cardiovascular disease) -Follow-up with cardiology as planned - CBC with Differential/Platelet - Lipid panel  5. Gastroesophageal reflux disease, esophagitis presence not specified -Continue to watch foods that would be irritating on the stomach. - CBC with Differential/Platelet - Hepatic function panel  6. Seizure disorder ([place]) -Seizure activity noted by patient - CBC with Differential/Platelet  7. Senile purpura ([facility]) -No significant purpura present today -  CBC with Differential/Platelet  8. Prostate cancer [facility]) -Follow-up with urology as planned - CBC with Differential/Platelet  9. Malignant neoplasm of urinary bladder, unspecified site [facility]) -Follow-up with urology as planned - CBC with Differential/Platelet  10. Seizures ([facility]) - CBC with Differential/Platelet  11. Viral URI -Continue with Mucinex gargle with warm salty water and use nasal saline spray of the nose and drink plenty of fluids - CBC with Differential/Platelet  Patient Instructions                       Medicare Annual Wellness Visit  [place] and the medical providers at [place] strive to bring you the best medical care.  In doing so we not only want to address your current medical conditions and concerns but also to detect new  conditions early and prevent illness, disease and health-related problems.    Medicare offers a yearly Wellness Visit which allows our clinical staff to assess your need for preventative services including immunizations, lifestyle education, counseling to decrease risk of preventable diseases and screening for fall risk and other medical concerns.    This visit is provided free of charge (no copay) for all Medicare recipients. The clinical pharmacists at [place] [facility] have begun to conduct these Wellness Visits which will also include a thorough review of all your medications.    As you primary medical provider recommend that you make an appointment for your Annual Wellness Visit if you have not done so already this year.  You may set up this appointment before you leave today or you may call back ([phone]) and schedule an appointment.  Please make sure when you call that you mention that you are scheduling your Annual Wellness Visit with the clinical pharmacist so that the appointment may be made for the proper length of time.     Continue current medications. Continue good therapeutic lifestyle changes which include good diet and exercise. Fall precautions discussed with patient. If an FOBT was given today- please return it to our front desk. If you are over 28 years old - you may need Prevnar 51 or the adult Pneumonia vaccine.  **Flu shots are available--- please call and schedule a FLU-CLINIC appointment**  After your visit with [place] today you will receive a survey in the mail or online from [employer] regarding your care with [place]. Please take a moment to fill this out. Your feedback is very important to [place] as you can help [place] better understand your patient needs as well as improve your experience and satisfaction. WE CARE ABOUT YOU!!!   Continue with physical therapy so that you will not be we can have an accident or fall Continue follow-up with cardiology and  urology Drink plenty of fluids and stay well-hydrated The probiotic align can be purchased at [facility] and you can get the generic version and take 1 daily. Continue to drink plenty of fluids and stay well-hydrated Continue with Mucinex gargle with warm salty water and use nasal saline and avoid irritating environments as much as possible If any worsening of your upper respiratory viral syndrome please call us back and we will call in an antibiotic if needed.  Arrie Senate MD

## 2017-12-13 ENCOUNTER — Other Ambulatory Visit: Payer: Self-pay | Admitting: *Deleted

## 2017-12-13 LAB — CBC WITH DIFFERENTIAL/PLATELET
BASOS: 1 %
Basophils Absolute: 0.1 10*3/uL (ref 0.0–0.2)
EOS (ABSOLUTE): 0.8 10*3/uL — ABNORMAL HIGH (ref 0.0–0.4)
EOS: 5 %
HEMATOCRIT: 37.7 % (ref 37.5–51.0)
HEMOGLOBIN: 12.6 g/dL — AB (ref 13.0–17.7)
IMMATURE GRANS (ABS): 0.1 10*3/uL (ref 0.0–0.1)
IMMATURE GRANULOCYTES: 1 %
LYMPHS: 18 %
Lymphocytes Absolute: 2.7 10*3/uL (ref 0.7–3.1)
MCH: 30.4 pg (ref 26.6–33.0)
MCHC: 33.4 g/dL (ref 31.5–35.7)
MCV: 91 fL (ref 79–97)
MONOS ABS: 1.3 10*3/uL — AB (ref 0.1–0.9)
Monocytes: 9 %
NEUTROS PCT: 66 %
Neutrophils Absolute: 9.8 10*3/uL — ABNORMAL HIGH (ref 1.4–7.0)
Platelets: 328 10*3/uL (ref 150–379)
RBC: 4.14 x10E6/uL (ref 4.14–5.80)
RDW: 15.3 % (ref 12.3–15.4)
WBC: 14.7 10*3/uL — ABNORMAL HIGH (ref 3.4–10.8)

## 2017-12-13 LAB — BMP8+EGFR
BUN/Creatinine Ratio: 22 (ref 10–24)
BUN: 20 mg/dL (ref 8–27)
CO2: 23 mmol/L (ref 20–29)
CREATININE: 0.89 mg/dL (ref 0.76–1.27)
Calcium: 9.3 mg/dL (ref 8.6–10.2)
Chloride: 103 mmol/L (ref 96–106)
GFR calc Af Amer: 91 mL/min/{1.73_m2} (ref >59)
GFR, EST NON AFRICAN AMERICAN: 79 mL/min/{1.73_m2} (ref >59)
Glucose: 96 mg/dL (ref 65–99)
Potassium: 4.5 mmol/L (ref 3.5–5.2)
SODIUM: 142 mmol/L (ref 134–144)

## 2017-12-13 LAB — HEPATIC FUNCTION PANEL
ALK PHOS: 149 IU/L — AB (ref 39–117)
ALT: 21 IU/L (ref 0–44)
AST: 25 IU/L (ref 0–40)
Albumin: 4.1 g/dL (ref 3.5–4.7)
Bilirubin Total: 0.3 mg/dL (ref 0.0–1.2)
Bilirubin, Direct: 0.14 mg/dL (ref 0.00–0.40)
Total Protein: 6.9 g/dL (ref 6.0–8.5)

## 2017-12-13 LAB — LIPID PANEL
CHOL/HDL RATIO: 2.2 ratio (ref 0.0–5.0)
Cholesterol, Total: 154 mg/dL (ref 100–199)
HDL: 70 mg/dL (ref >39)
LDL CALC: 71 mg/dL (ref 0–99)
TRIGLYCERIDES: 63 mg/dL (ref 0–149)
VLDL CHOLESTEROL CAL: 13 mg/dL (ref 5–40)

## 2017-12-13 LAB — VITAMIN D 25 HYDROXY (VIT D DEFICIENCY, FRACTURES): Vit D, 25-Hydroxy: 34 ng/mL (ref 30.0–100.0)

## 2017-12-13 MED ORDER — AZITHROMYCIN 250 MG PO TABS: ORAL_TABLET | ORAL | 0 refills | Status: DC

## 2017-12-14 ENCOUNTER — Ambulatory Visit (INDEPENDENT_AMBULATORY_CARE_PROVIDER_SITE_OTHER): Payer: Medicare Other | Admitting: *Deleted

## 2017-12-14 DIAGNOSIS — I441 Atrioventricular block, second degree: Secondary | ICD-10-CM | POA: Diagnosis not present

## 2017-12-15 NOTE — Progress Notes (Signed)
Remote pacemaker transmission.   

## 2017-12-18 ENCOUNTER — Encounter: Payer: Self-pay | Admitting: Cardiology

## 2017-12-20 ENCOUNTER — Ambulatory Visit: Payer: Medicare Other | Admitting: Family Medicine

## 2017-12-22 DIAGNOSIS — C44629 Squamous cell carcinoma of skin of left upper limb, including shoulder: Secondary | ICD-10-CM | POA: Diagnosis not present

## 2017-12-27 DIAGNOSIS — Z8551 Personal history of malignant neoplasm of bladder: Secondary | ICD-10-CM | POA: Diagnosis not present

## 2017-12-30 LAB — CUP PACEART REMOTE DEVICE CHECK
Battery Remaining Longevity: 129 mo
Brady Statistic AP VS Percent: 0.06 %
Brady Statistic AS VS Percent: 0.97 %
Brady Statistic RA Percent Paced: 45.61 %
Brady Statistic RV Percent Paced: 98.97 %
Date Time Interrogation Session: 20190328032710
Implantable Lead Location: 753859
Implantable Lead Location: 753860
Implantable Lead Model: 5076
Implantable Lead Model: 5076
Lead Channel Impedance Value: 456 Ohm
Lead Channel Pacing Threshold Pulse Width: 0.4 ms
Lead Channel Sensing Intrinsic Amplitude: 2.375 mV
Lead Channel Sensing Intrinsic Amplitude: 2.375 mV
Lead Channel Sensing Intrinsic Amplitude: 9.125 mV
Lead Channel Sensing Intrinsic Amplitude: 9.125 mV
Lead Channel Setting Pacing Amplitude: 2.5 V
Lead Channel Setting Pacing Pulse Width: 0.4 ms
Lead Channel Setting Sensing Sensitivity: 0.9 mV
MDC IDC LEAD IMPLANT DT: 20180927
MDC IDC LEAD IMPLANT DT: 20180927
MDC IDC MSMT BATTERY VOLTAGE: 3.1 V
MDC IDC MSMT LEADCHNL RA IMPEDANCE VALUE: 285 Ohm
MDC IDC MSMT LEADCHNL RA IMPEDANCE VALUE: 342 Ohm
MDC IDC MSMT LEADCHNL RA PACING THRESHOLD AMPLITUDE: 0.375 V
MDC IDC MSMT LEADCHNL RV IMPEDANCE VALUE: 361 Ohm
MDC IDC MSMT LEADCHNL RV PACING THRESHOLD AMPLITUDE: 0.625 V
MDC IDC MSMT LEADCHNL RV PACING THRESHOLD PULSEWIDTH: 0.4 ms
MDC IDC PG IMPLANT DT: 20180927
MDC IDC SET LEADCHNL RA PACING AMPLITUDE: 1.5 V
MDC IDC STAT BRADY AP VP PERCENT: 45.51 %
MDC IDC STAT BRADY AS VP PERCENT: 53.46 %

## 2018-01-04 ENCOUNTER — Other Ambulatory Visit: Payer: Self-pay | Admitting: Family Medicine

## 2018-01-04 ENCOUNTER — Other Ambulatory Visit: Payer: Self-pay

## 2018-01-04 MED ORDER — PHENYTOIN SODIUM EXTENDED 100 MG PO CAPS: 200.0000 mg | ORAL_CAPSULE | Freq: Two times a day (BID) | ORAL | 0 refills | Status: DC

## 2018-01-04 NOTE — Telephone Encounter (Signed)
Sent in requested rx  °

## 2018-01-16 DIAGNOSIS — H353221 Exudative age-related macular degeneration, left eye, with active choroidal neovascularization: Secondary | ICD-10-CM | POA: Diagnosis not present

## 2018-01-16 MED ORDER — CITALOPRAM HYDROBROMIDE 20 MG PO TABS: 20.0000 mg | ORAL_TABLET | Freq: Every day | ORAL | 0 refills | Status: DC

## 2018-01-16 NOTE — Telephone Encounter (Signed)
What is the name of the medication? citalopram (CELEXA) 20 MG tablet  Have you contacted your pharmacy to request a refill? no  Which pharmacy would you like this sent to? [facility] mail order 90 day supply   Patient notified that their request is being sent to the clinical staff for review and that they should receive a call once it is complete. If they do not receive a call within 24 hours they can check with their pharmacy or our office.

## 2018-01-16 NOTE — Telephone Encounter (Signed)
Rx filled

## 2018-01-19 ENCOUNTER — Ambulatory Visit (INDEPENDENT_AMBULATORY_CARE_PROVIDER_SITE_OTHER): Payer: Medicare Other | Admitting: Internal Medicine

## 2018-01-19 ENCOUNTER — Telehealth: Payer: Self-pay | Admitting: Internal Medicine

## 2018-01-19 VITALS — BP 150/62 | HR 62

## 2018-01-19 DIAGNOSIS — T827XXA Infection and inflammatory reaction due to other cardiac and vascular devices, implants and grafts, initial encounter: Secondary | ICD-10-CM

## 2018-01-19 DIAGNOSIS — I441 Atrioventricular block, second degree: Secondary | ICD-10-CM | POA: Diagnosis not present

## 2018-01-19 DIAGNOSIS — I2510 Atherosclerotic heart disease of native coronary artery without angina pectoris: Secondary | ICD-10-CM

## 2018-01-19 NOTE — Telephone Encounter (Signed)
Spoke with patient who reports an opening "below" his incision that is open with a white object protruding. Patient was agreeable to a DC appt today at 2:30pm.

## 2018-01-19 NOTE — Progress Notes (Signed)
HPI [name] returns today for an unscheduled visit complaining of being able to see the pacemaker lead coming out of his chest. He is a pleasant 84 yo man with second degree AV block who underwent PPM insertion less than a year ago. He took plavix before his procedure and developed a large hematoma and took a long time to heal. He has no developed an opening in his incision as noted above. He denies fever or chils. He does not appear ill.  Allergies  Allergen Reactions  . Penicillins Rash    Has patient had a PCN reaction causing immediate rash, facial/tongue/throat swelling, SOB or lightheadedness with hypotension: Yes Has patient had a PCN reaction causing severe rash involving mucus membranes or skin necrosis: No Has patient had a PCN reaction that required hospitalization No Has patient had a PCN reaction occurring within the last 10 years: No If all of the above answers are "NO", then may proceed with Cephalosporin use.      Current Outpatient Medications  Medication Sig Dispense Refill  . aspirin EC 81 MG tablet Take 1 tablet (81 mg total) by mouth daily. 90 tablet 3  . atorvastatin (LIPITOR) 20 MG tablet Take 1 tablet (20 mg total) by mouth daily. 90 tablet 3  . calcium carbonate (OSCAL) 1500 (600 Ca) MG TABS tablet Take 600 mg of elemental calcium by mouth daily with breakfast.    . Cholecalciferol (VITAMIN D3) 1000 units CAPS Take 1 capsule by mouth daily.    . citalopram (CELEXA) 10 MG tablet Take 10 mg by mouth daily.    . citalopram (CELEXA) 20 MG tablet Take 1 tablet (20 mg total) by mouth daily. 90 tablet 0  . clopidogrel (PLAVIX) 75 MG tablet Take 1 tablet (75 mg total) by mouth daily. 90 tablet 3  . Multiple Vitamins-Minerals (PRESERVISION AREDS 2) CAPS Take 1 capsule by mouth daily.    Marland Kitchen PHENobarbital (LUMINAL) 64.8 MG tablet Take 1 tablet (64.8 mg total) by mouth 2 (two) times daily. 180 tablet 3  . phenytoin (DILANTIN) 100 MG ER capsule Take 2 capsules (200 mg  total) by mouth 2 (two) times daily. 360 capsule 0  . azithromycin (ZITHROMAX) 250 MG tablet As directed (Patient not taking: Reported on 01/19/2018) 6 tablet 0  . spironolactone (ALDACTONE) 25 MG tablet Take 1 tablet (25 mg total) by mouth daily. 90 tablet 3   No current facility-administered medications for this visit.      Past Medical History:  Diagnosis Date  . 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 Arthritis   . Bilateral lower extremity edema   . Bladder tumor   . CAD (coronary artery disease) cardiologist--  dr Daneen Schick   a. 04/27/2016 PCI with DES to RCA with 50% ostial to 60% segmental mid to distal left main, and 90% thrombus filled ostial to proximal circumflex, with distal right coronary filled by collaterals from left-to-right  . COPD with emphysema ([place])   . Dyspnea on exertion   . ED (erectile dysfunction)   . First degree AV block   . GERD (gastroesophageal reflux disease)   . History of adenomatous polyp of colon    09-22-2008  . History of bladder cancer    s/p  resection bladder tumor 04-19-2016  non-invasive low-grade urothelial carcinoma  . History of prostate cancer urologist-  dr Jeffie Pollock-- last PSA 0.02 (summer 2017)   dx 02/ 2013-- Stage T2a,  Gleason 7, PSA 1.58--  s/p  radioactive prostate seed implants 01-19-2012  . History of ST elevation myocardial infarction (STEMI)    04-27-2016  . Hyperlipidemia   . Hypertension   . Presence of permanent cardiac pacemaker   . S/P drug eluting coronary stent placement    04-27-2016   . Second degree Mobitz I AV block    occasional w/ first degree heart block  per cardiologist note by dr Daneen Schick  . Seizures ([place]) per pt son -- no seizure's since 1980   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  arteriorvenous cerebral malformation s/p  craniotomy 1980  . Vitamin D deficiency     ROS:   All systems reviewed and negative except as noted in the  HPI.   Past Surgical History:  Procedure Laterality Date  . APPENDECTOMY  2011  . BLEPHAROPLASTY Bilateral revision 02-17-2016  . CARDIAC CATHETERIZATION N/A 04/27/2016   Procedure: Left Heart Cath and Coronary Angiography;  Surgeon: Belva Crome, MD;  Location: [facility];  Service: Cardiovascular;  Laterality: N/A;  acute inferior wall STEMI ;  ostCx to midCx 90%,  ost1st Mrg to 1st Mrg 50%,  mid to distal RCA 100%,  pRCA 80%,  LVEF 45-50% w/ inferobasal akinesis  . CARDIAC CATHETERIZATION N/A 04/27/2016   Procedure: Temporary Pacemaker;  Surgeon: Belva Crome, MD;  Location: [facility];  Service: Cardiovascular;  Laterality: N/A;  mobitz 2  second degree HB w/ heart rate 29bpm  . CARDIAC CATHETERIZATION N/A 04/27/2016   Procedure: Coronary Stent Intervention;  Surgeon: Belva Crome, MD;  Location: [facility];  Service: Cardiovascular;  Laterality: N/A; DES x1 to  Proximal RCA;  DES x1 to Mid RCA  . CATARACT EXTRACTION W/ INTRAOCULAR LENS  IMPLANT, BILATERAL  2016  approx.  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 CYSTOSCOPY  01/19/2012   Procedure: CYSTOSCOPY;  Surgeon: Malka So, MD;  Location: [facility];  Service: Urology;;  no seeds found in bladder  . CYSTOSCOPY W/ RETROGRADES Bilateral 04/19/2016   Procedure: CYSTOSCOPY WITH BILATERAL RETROGRADE PYELOGRAM TRANSURETHRAL RESECTION OF BLADDER TUMOR ;  Surgeon: [name], MD;  Location: [facility];  Service: Urology;  Laterality: Bilateral;  . CYSTOSCOPY WITH BIOPSY N/A 11/10/2016   Procedure: CYSTOSCOPY WITH BIOPSY AND FULGURATION;  Surgeon: [name], MD;  Location: [facility];  Service: Urology;  Laterality: N/A;  . INSERT / REPLACE / REMOVE PACEMAKER  06/15/2017  . PACEMAKER IMPLANT N/A 06/15/2017   Procedure: Pacemaker Implant;  Surgeon: [name], MD;  Location: [facility];  Service: Cardiovascular;  Laterality: N/A;  .  RADIOACTIVE SEED IMPLANT  01/19/2012   Procedure: RADIOACTIVE SEED IMPLANT;  Surgeon: Malka So, MD;  Location[facility];  Service: Urology;  Laterality: N/A;  68 seeds implanted  . TRANSTHORACIC ECHOCARDIOGRAM  06/02/2016   ef 60-65%/  trivial MR/  mild TR     Family History  Problem Relation Age of Onset  . Emphysema Mother        age 11 deceased  . Stroke Mother   . Emphysema Father        deceased age 77     Social History   Socioeconomic History  . Marital status: Widowed    Spouse name: Not on file  . Number of children: 2  . Years of education: Not on file  .  Highest education level: Not on file  Occupational History  . Occupation: Retired  Scientific laboratory technician  . Financial resource strain: Not on file  . Food insecurity:    Worry: Not on file    Inability: Not on file  . Transportation needs:    Medical: Not on file    Non-medical: Not on file  Tobacco Use  . Smoking status: Former Smoker    Packs/day: 2.00    Years: 15.00    Pack years: 30.00    Last attempt to quit: 01/15/1973    Years since quitting: 45.0  . Smokeless tobacco: Never Used  Substance and Sexual Activity  . Alcohol use: Yes    Comment: occasionally  . Drug use: No  . Sexual activity: Not on file  Lifestyle  . Physical activity:    Days per week: Not on file    Minutes per session: Not on file  . Stress: Not on file  Relationships  . Social connections:    Talks on phone: Not on file    Gets together: Not on file    Attends religious service: Not on file    Active member of club or organization: Not on file    Attends meetings of clubs or organizations: Not on file    Relationship status: Not on file  . Intimate partner violence:    Fear of current or ex partner: Not on file    Emotionally abused: Not on file    Physically abused: Not on file    Forced sexual activity: Not on file  Other Topics Concern  . Not on file  Social History Narrative   Martin Majestic to [facility] after MI for rehab.     BP (!) 150/62 (BP Location: Right Arm, Patient Position: Sitting)   Pulse 62   Physical Exam:  Well appearing elderly man, NAD HEENT: Unremarkable Neck: 6 cm JVD, no thyromegally Lymphatics:  No adenopathy Back:  No CVA tenderness Lungs:  Clear with no wheezes, his PPM lead is easily visualized. HEART:  Regular rate rhythm, no murmurs, no rubs, no clicks Abd:  soft, positive bowel sounds, no organomegally, no rebound, no guarding Ext:  2 plus pulses, no edema, no cyanosis, no clubbing Skin:  No rashes no nodules Neuro:  CN II through XII intact, motor grossly intact  EKG - none  DEVICE  Normal device function.  See PaceArt for details.   Assess/Plan: 1. PM pocket infection - I have discussed the treatment with the patient, his son and Dr. Rayann Heman. He will undergo extraction of his PPM and possible temp perm PM. If he remains stable, will plan to place a new device 48 hours after the extraction. 2. HTN - his blood pressure is elevated. We will consider uptitration in his meds after his new device is placed.  Mikle Bosworth.D.

## 2018-01-19 NOTE — H&P (View-Only) (Signed)
HPI Joshua [name] returns today for an unscheduled visit complaining of being able to see the pacemaker lead coming out of his chest. He is a pleasant 84 yo man with second degree AV block who underwent PPM insertion less than a year ago. He took plavix before his procedure and developed a large hematoma and took a long time to heal. He has no developed an opening in his incision as noted above. He denies fever or chils. He does not appear ill.  Allergies  Allergen Reactions  . Penicillins Rash    Has patient had a PCN reaction causing immediate rash, facial/tongue/throat swelling, SOB or lightheadedness with hypotension: Yes Has patient had a PCN reaction causing severe rash involving mucus membranes or skin necrosis: No Has patient had a PCN reaction that required hospitalization No Has patient had a PCN reaction occurring within the last 10 years: No If all of the above answers are "NO", then may proceed with Cephalosporin use.      Current Outpatient Medications  Medication Sig Dispense Refill  . aspirin EC 81 MG tablet Take 1 tablet (81 mg total) by mouth daily. 90 tablet 3  . atorvastatin (LIPITOR) 20 MG tablet Take 1 tablet (20 mg total) by mouth daily. 90 tablet 3  . calcium carbonate (OSCAL) 1500 (600 Ca) MG TABS tablet Take 600 mg of elemental calcium by mouth daily with breakfast.    . Cholecalciferol (VITAMIN D3) 1000 units CAPS Take 1 capsule by mouth daily.    . citalopram (CELEXA) 10 MG tablet Take 10 mg by mouth daily.    . citalopram (CELEXA) 20 MG tablet Take 1 tablet (20 mg total) by mouth daily. 90 tablet 0  . clopidogrel (PLAVIX) 75 MG tablet Take 1 tablet (75 mg total) by mouth daily. 90 tablet 3  . Multiple Vitamins-Minerals (PRESERVISION AREDS 2) CAPS Take 1 capsule by mouth daily.    Marland Kitchen PHENobarbital (LUMINAL) 64.8 MG tablet Take 1 tablet (64.8 mg total) by mouth 2 (two) times daily. 180 tablet 3  . phenytoin (DILANTIN) 100 MG ER capsule Take 2 capsules (200 mg  total) by mouth 2 (two) times daily. 360 capsule 0  . azithromycin (ZITHROMAX) 250 MG tablet As directed (Patient not taking: Reported on 01/19/2018) 6 tablet 0  . spironolactone (ALDACTONE) 25 MG tablet Take 1 tablet (25 mg total) by mouth daily. 90 tablet 3   No current facility-administered medications for this visit.      Past Medical History:  Diagnosis Date  . 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 Arthritis   . Bilateral lower extremity edema   . Bladder tumor   . CAD (coronary artery disease) cardiologist--  dr Daneen Schick   a. 04/27/2016 PCI with DES to RCA with 50% ostial to 60% segmental mid to distal left main, and 90% thrombus filled ostial to proximal circumflex, with distal right coronary filled by collaterals from left-to-right  . COPD with emphysema ([place])   . Dyspnea on exertion   . ED (erectile dysfunction)   . First degree AV block   . GERD (gastroesophageal reflux disease)   . History of adenomatous polyp of colon    09-22-2008  . History of bladder cancer    s/p  resection bladder tumor 04-19-2016  non-invasive low-grade urothelial carcinoma  . History of prostate cancer urologist-  dr Jeffie Pollock-- last PSA 0.02 (summer 2017)   dx 02/ 2013-- Stage T2a,  Gleason 7, PSA 1.58--  s/p  radioactive prostate seed implants 01-19-2012  . History of ST elevation myocardial infarction (STEMI)    04-27-2016  . Hyperlipidemia   . Hypertension   . Presence of permanent cardiac pacemaker   . S/P drug eluting coronary stent placement    04-27-2016   . Second degree Mobitz I AV block    occasional w/ first degree heart block  per cardiologist note by dr Daneen Schick  . Seizures ([place]) per pt son -- no seizure's since 1980   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  arteriorvenous cerebral malformation s/p  craniotomy 1980  . Vitamin D deficiency     ROS:   All systems reviewed and negative except as noted in the  HPI.   Past Surgical History:  Procedure Laterality Date  . APPENDECTOMY  2011  . BLEPHAROPLASTY Bilateral revision 02-17-2016  . CARDIAC CATHETERIZATION N/A 04/27/2016   Procedure: Left Heart Cath and Coronary Angiography;  Surgeon: Belva Crome, MD;  Location: [facility];  Service: Cardiovascular;  Laterality: N/A;  acute inferior wall STEMI ;  ostCx to midCx 90%,  ost1st Mrg to 1st Mrg 50%,  mid to distal RCA 100%,  pRCA 80%,  LVEF 45-50% w/ inferobasal akinesis  . CARDIAC CATHETERIZATION N/A 04/27/2016   Procedure: Temporary Pacemaker;  Surgeon: Belva Crome, MD;  Location: [facility];  Service: Cardiovascular;  Laterality: N/A;  mobitz 2  second degree HB w/ heart rate 29bpm  . CARDIAC CATHETERIZATION N/A 04/27/2016   Procedure: Coronary Stent Intervention;  Surgeon: Belva Crome, MD;  Location: [facility];  Service: Cardiovascular;  Laterality: N/A; DES x1 to  Proximal RCA;  DES x1 to Mid RCA  . CATARACT EXTRACTION W/ INTRAOCULAR LENS  IMPLANT, BILATERAL  2016  approx.  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 CYSTOSCOPY  01/19/2012   Procedure: CYSTOSCOPY;  Surgeon: Malka So, MD;  Location: [facility];  Service: Urology;;  no seeds found in bladder  . CYSTOSCOPY W/ RETROGRADES Bilateral 04/19/2016   Procedure: CYSTOSCOPY WITH BILATERAL RETROGRADE PYELOGRAM TRANSURETHRAL RESECTION OF BLADDER TUMOR ;  Surgeon: [name], MD;  Location: [facility];  Service: Urology;  Laterality: Bilateral;  . CYSTOSCOPY WITH BIOPSY N/A 11/10/2016   Procedure: CYSTOSCOPY WITH BIOPSY AND FULGURATION;  Surgeon: [name], MD;  Location: [facility];  Service: Urology;  Laterality: N/A;  . INSERT / REPLACE / REMOVE PACEMAKER  06/15/2017  . PACEMAKER IMPLANT N/A 06/15/2017   Procedure: Pacemaker Implant;  Surgeon: [name], MD;  Location: [facility];  Service: Cardiovascular;  Laterality: N/A;  .  RADIOACTIVE SEED IMPLANT  01/19/2012   Procedure: RADIOACTIVE SEED IMPLANT;  Surgeon: Malka So, MD;  Location: [facility];  Service: Urology;  Laterality: N/A;  68 seeds implanted  . TRANSTHORACIC ECHOCARDIOGRAM  06/02/2016   ef 60-65%/  trivial MR/  mild TR     Family History  Problem Relation Age of Onset  . Emphysema Mother        age 32 deceased  . Stroke Mother   . Emphysema Father        deceased age 31     Social History   Socioeconomic History  . Marital status: Widowed    Spouse name: Not on file  . Number of children: 2  . Years of education: Not on file  .  Highest education level: Not on file  Occupational History  . Occupation: Retired  Scientific laboratory technician  . Financial resource strain: Not on file  . Food insecurity:    Worry: Not on file    Inability: Not on file  . Transportation needs:    Medical: Not on file    Non-medical: Not on file  Tobacco Use  . Smoking status: Former Smoker    Packs/day: 2.00    Years: 15.00    Pack years: 30.00    Last attempt to quit: 01/15/1973    Years since quitting: 45.0  . Smokeless tobacco: Never Used  Substance and Sexual Activity  . Alcohol use: Yes    Comment: occasionally  . Drug use: No  . Sexual activity: Not on file  Lifestyle  . Physical activity:    Days per week: Not on file    Minutes per session: Not on file  . Stress: Not on file  Relationships  . Social connections:    Talks on phone: Not on file    Gets together: Not on file    Attends religious service: Not on file    Active member of club or organization: Not on file    Attends meetings of clubs or organizations: Not on file    Relationship status: Not on file  . Intimate partner violence:    Fear of current or ex partner: Not on file    Emotionally abused: Not on file    Physically abused: Not on file    Forced sexual activity: Not on file  Other Topics Concern  . Not on file  Social History Narrative   Martin Majestic to [facility] after MI for rehab.     BP (!) 150/62 (BP Location: Right Arm, Patient Position: Sitting)   Pulse 62   Physical Exam:  Well appearing elderly man, NAD HEENT: Unremarkable Neck: 6 cm JVD, no thyromegally Lymphatics:  No adenopathy Back:  No CVA tenderness Lungs:  Clear with no wheezes, his PPM lead is easily visualized. HEART:  Regular rate rhythm, no murmurs, no rubs, no clicks Abd:  soft, positive bowel sounds, no organomegally, no rebound, no guarding Ext:  2 plus pulses, no edema, no cyanosis, no clubbing Skin:  No rashes no nodules Neuro:  CN II through XII intact, motor grossly intact  EKG - none  DEVICE  Normal device function.  See PaceArt for details.   Assess/Plan: 1. PM pocket infection - I have discussed the treatment with the patient, his son and Dr. Rayann Heman. He will undergo extraction of his PPM and possible temp perm PM. If he remains stable, will plan to place a new device 48 hours after the extraction. 2. HTN - his blood pressure is elevated. We will consider uptitration in his meds after his new device is placed.  Joshua [name].D.

## 2018-01-19 NOTE — Patient Instructions (Addendum)
Medication Instructions:  Your physician recommends that you continue on your current medications as directed. Please refer to the Current Medication list given to you today.  Labwork: You will get lab work today:  BMP and CBC  Testing/Procedures: None ordered.  Follow-Up: Your follow up will be scheduled after your hospitalization.  Any Other Special Instructions Will Be Listed Below (If Applicable).  Please arrive at the [facility] main entrance of [facility] hospital at:  1:00 pm on Jan 22, 2018 You may have a light breakfast before 7:00 am.  After that you may have small sips of water for dry mouth.  Try to limit your intake. Stop taking your PLAVIX today 01/19/2018 The morning of your procedure you may take your normal morning medicines with a sip of water You will be admitted for this procedure.   If you need a refill on your cardiac medications before your next appointment, please call your pharmacy.

## 2018-01-19 NOTE — Progress Notes (Signed)
Patient seen today with c/o opening at incision with "something white" visible. Upon assessment Band-Aid removed to reveal exposed pacemaker lead. JA called and notified - JA requested that GT assess. GT assessed and recommended extraction. Will plan to schedule for Monday. Patient instructed to hold Plavix beginning Saturday. Patient verbalized understanding.

## 2018-01-19 NOTE — Telephone Encounter (Signed)
New Message     1. Has your device fired? no  2. Is you device beeping? no  3. Are you experiencing draining or swelling at device site? no  4. Are you calling to see if we received your device transmission? no  5. Have you passed out? No  Patient is calling in reference to the incision site. He states that it has came open and it appears that something is protruding out of the incision site. Please call to discuss.    Please route to Device Clinic Pool.

## 2018-01-20 ENCOUNTER — Encounter: Payer: Self-pay | Admitting: Internal Medicine

## 2018-01-20 LAB — CBC WITH DIFFERENTIAL/PLATELET
BASOS ABS: 0.2 10*3/uL (ref 0.0–0.2)
BASOS: 1 %
EOS (ABSOLUTE): 1 10*3/uL — ABNORMAL HIGH (ref 0.0–0.4)
EOS: 9 %
HEMATOCRIT: 35.9 % — AB (ref 37.5–51.0)
HEMOGLOBIN: 12.5 g/dL — AB (ref 13.0–17.7)
Immature Grans (Abs): 0.1 10*3/uL (ref 0.0–0.1)
Immature Granulocytes: 1 %
LYMPHS ABS: 2.8 10*3/uL (ref 0.7–3.1)
Lymphs: 26 %
MCH: 30.7 pg (ref 26.6–33.0)
MCHC: 34.8 g/dL (ref 31.5–35.7)
MCV: 88 fL (ref 79–97)
MONOCYTES: 12 %
Monocytes Absolute: 1.3 10*3/uL — ABNORMAL HIGH (ref 0.1–0.9)
NEUTROS ABS: 5.3 10*3/uL (ref 1.4–7.0)
Neutrophils: 51 %
Platelets: 318 10*3/uL (ref 150–379)
RBC: 4.07 x10E6/uL — ABNORMAL LOW (ref 4.14–5.80)
RDW: 14.8 % (ref 12.3–15.4)
WBC: 10.5 10*3/uL (ref 3.4–10.8)

## 2018-01-20 LAB — BASIC METABOLIC PANEL
BUN / CREAT RATIO: 19 (ref 10–24)
BUN: 15 mg/dL (ref 8–27)
CALCIUM: 9.2 mg/dL (ref 8.6–10.2)
CO2: 24 mmol/L (ref 20–29)
CREATININE: 0.8 mg/dL (ref 0.76–1.27)
Chloride: 103 mmol/L (ref 96–106)
GFR, EST AFRICAN AMERICAN: 95 mL/min/{1.73_m2} (ref >59)
GFR, EST NON AFRICAN AMERICAN: 83 mL/min/{1.73_m2} (ref >59)
Glucose: 69 mg/dL (ref 65–99)
Potassium: 4.2 mmol/L (ref 3.5–5.2)
Sodium: 140 mmol/L (ref 134–144)

## 2018-01-22 ENCOUNTER — Inpatient Hospital Stay (HOSPITAL_COMMUNITY)
Admission: RE | Admit: 2018-01-22 | Discharge: 2018-01-26 | DRG: 243 | Disposition: A | Discharge status: Home / Self Care | Payer: Medicare Other | Source: Ambulatory Visit | Attending: Internal Medicine | Admitting: Internal Medicine

## 2018-01-22 ENCOUNTER — Encounter (HOSPITAL_COMMUNITY)
Admission: RE | Disposition: A | Discharge status: Home / Self Care | Payer: Self-pay | Source: Ambulatory Visit | Attending: Internal Medicine

## 2018-01-22 ENCOUNTER — Other Ambulatory Visit: Payer: Self-pay

## 2018-01-22 DIAGNOSIS — E785 Hyperlipidemia, unspecified: Secondary | ICD-10-CM | POA: Diagnosis not present

## 2018-01-22 DIAGNOSIS — T827XXS Infection and inflammatory reaction due to other cardiac and vascular devices, implants and grafts, sequela: Secondary | ICD-10-CM

## 2018-01-22 DIAGNOSIS — Z8546 Personal history of malignant neoplasm of prostate: Secondary | ICD-10-CM | POA: Diagnosis not present

## 2018-01-22 DIAGNOSIS — I10 Essential (primary) hypertension: Secondary | ICD-10-CM | POA: Diagnosis present

## 2018-01-22 DIAGNOSIS — Z823 Family history of stroke: Secondary | ICD-10-CM

## 2018-01-22 DIAGNOSIS — I2510 Atherosclerotic heart disease of native coronary artery without angina pectoris: Secondary | ICD-10-CM | POA: Diagnosis present

## 2018-01-22 DIAGNOSIS — Z955 Presence of coronary angioplasty implant and graft: Secondary | ICD-10-CM

## 2018-01-22 DIAGNOSIS — Z950 Presence of cardiac pacemaker: Secondary | ICD-10-CM | POA: Diagnosis not present

## 2018-01-22 DIAGNOSIS — J40 Bronchitis, not specified as acute or chronic: Secondary | ICD-10-CM | POA: Diagnosis not present

## 2018-01-22 DIAGNOSIS — Z825 Family history of asthma and other chronic lower respiratory diseases: Secondary | ICD-10-CM

## 2018-01-22 DIAGNOSIS — E559 Vitamin D deficiency, unspecified: Secondary | ICD-10-CM | POA: Diagnosis present

## 2018-01-22 DIAGNOSIS — J449 Chronic obstructive pulmonary disease, unspecified: Secondary | ICD-10-CM | POA: Diagnosis not present

## 2018-01-22 DIAGNOSIS — Z9842 Cataract extraction status, left eye: Secondary | ICD-10-CM | POA: Diagnosis not present

## 2018-01-22 DIAGNOSIS — G40909 Epilepsy, unspecified, not intractable, without status epilepticus: Secondary | ICD-10-CM | POA: Diagnosis not present

## 2018-01-22 DIAGNOSIS — Z79899 Other long term (current) drug therapy: Secondary | ICD-10-CM

## 2018-01-22 DIAGNOSIS — Z880 Allergy status to penicillin: Secondary | ICD-10-CM | POA: Diagnosis not present

## 2018-01-22 DIAGNOSIS — K219 Gastro-esophageal reflux disease without esophagitis: Secondary | ICD-10-CM | POA: Diagnosis not present

## 2018-01-22 DIAGNOSIS — I252 Old myocardial infarction: Secondary | ICD-10-CM

## 2018-01-22 DIAGNOSIS — T827XXA Infection and inflammatory reaction due to other cardiac and vascular devices, implants and grafts, initial encounter: Principal | ICD-10-CM | POA: Diagnosis present

## 2018-01-22 DIAGNOSIS — Z961 Presence of intraocular lens: Secondary | ICD-10-CM | POA: Diagnosis present

## 2018-01-22 DIAGNOSIS — Z86010 Personal history of colonic polyps: Secondary | ICD-10-CM | POA: Diagnosis not present

## 2018-01-22 DIAGNOSIS — Z9841 Cataract extraction status, right eye: Secondary | ICD-10-CM | POA: Diagnosis not present

## 2018-01-22 DIAGNOSIS — Z8551 Personal history of malignant neoplasm of bladder: Secondary | ICD-10-CM | POA: Diagnosis not present

## 2018-01-22 DIAGNOSIS — Z87891 Personal history of nicotine dependence: Secondary | ICD-10-CM | POA: Diagnosis not present

## 2018-01-22 DIAGNOSIS — I442 Atrioventricular block, complete: Secondary | ICD-10-CM | POA: Diagnosis not present

## 2018-01-22 DIAGNOSIS — Z7982 Long term (current) use of aspirin: Secondary | ICD-10-CM

## 2018-01-22 DIAGNOSIS — Z7902 Long term (current) use of antithrombotics/antiplatelets: Secondary | ICD-10-CM

## 2018-01-22 DIAGNOSIS — Y831 Surgical operation with implant of artificial internal device as the cause of abnormal reaction of the patient, or of later complication, without mention of misadventure at the time of the procedure: Secondary | ICD-10-CM | POA: Diagnosis present

## 2018-01-22 HISTORY — PX: LEAD EXTRACTION: EP1211

## 2018-01-22 HISTORY — PX: PPM GENERATOR REMOVAL: EP1234

## 2018-01-22 LAB — SURGICAL PCR SCREEN
MRSA, PCR: NEGATIVE
STAPHYLOCOCCUS AUREUS: NEGATIVE

## 2018-01-22 SURGERY — PPM GENERATOR REMOVAL
Anesthesia: LOCAL

## 2018-01-22 MED ORDER — HEPARIN (PORCINE) IN NACL 1000-0.9 UT/500ML-% IV SOLN
INTRAVENOUS | Status: AC
  Filled 2018-01-22: qty 500

## 2018-01-22 MED ORDER — LIDOCAINE HCL (PF) 1 % IJ SOLN
INTRAMUSCULAR | Status: DC | PRN
  Administered 2018-01-22: 60 mL

## 2018-01-22 MED ORDER — ACETAMINOPHEN 325 MG PO TABS: 325.0000 mg | ORAL_TABLET | ORAL | Status: DC | PRN

## 2018-01-22 MED ORDER — VANCOMYCIN HCL IN DEXTROSE 1-5 GM/200ML-% IV SOLN
1000.0000 mg | Freq: Two times a day (BID) | INTRAVENOUS | Status: AC
  Administered 2018-01-23: 1000 mg via INTRAVENOUS
  Filled 2018-01-22 (×2): qty 200

## 2018-01-22 MED ORDER — MIDAZOLAM HCL 5 MG/5ML IJ SOLN
INTRAMUSCULAR | Status: DC | PRN
  Administered 2018-01-22: 1 mg via INTRAVENOUS

## 2018-01-22 MED ORDER — ONDANSETRON HCL 4 MG/2ML IJ SOLN: 4.0000 mg | Freq: Four times a day (QID) | INTRAMUSCULAR | Status: DC | PRN

## 2018-01-22 MED ORDER — LIDOCAINE HCL (PF) 1 % IJ SOLN
INTRAMUSCULAR | Status: AC
  Filled 2018-01-22: qty 30

## 2018-01-22 MED ORDER — VANCOMYCIN HCL IN DEXTROSE 1-5 GM/200ML-% IV SOLN
INTRAVENOUS | Status: AC
  Filled 2018-01-22: qty 200

## 2018-01-22 MED ORDER — SODIUM CHLORIDE 0.9 % IV SOLN
80.0000 mg | INTRAVENOUS | Status: AC
  Administered 2018-01-22: 80 mg

## 2018-01-22 MED ORDER — SODIUM CHLORIDE 0.9 % IV SOLN
INTRAVENOUS | Status: DC
  Administered 2018-01-22: 11:00:00 via INTRAVENOUS

## 2018-01-22 MED ORDER — GENTAMICIN SULFATE 40 MG/ML IJ SOLN
INTRAMUSCULAR | Status: AC
  Filled 2018-01-22: qty 2

## 2018-01-22 MED ORDER — FENTANYL CITRATE (PF) 100 MCG/2ML IJ SOLN
INTRAMUSCULAR | Status: AC
  Filled 2018-01-22: qty 2

## 2018-01-22 MED ORDER — MUPIROCIN 2 % EX OINT
TOPICAL_OINTMENT | CUTANEOUS | Status: AC
  Administered 2018-01-22: 1
  Filled 2018-01-22: qty 22

## 2018-01-22 MED ORDER — VANCOMYCIN HCL IN DEXTROSE 1-5 GM/200ML-% IV SOLN
1000.0000 mg | INTRAVENOUS | Status: AC
  Administered 2018-01-22: 1000 mg via INTRAVENOUS

## 2018-01-22 MED ORDER — MIDAZOLAM HCL 5 MG/5ML IJ SOLN
INTRAMUSCULAR | Status: AC
  Filled 2018-01-22: qty 5

## 2018-01-22 MED ORDER — FENTANYL CITRATE (PF) 100 MCG/2ML IJ SOLN
INTRAMUSCULAR | Status: DC | PRN
  Administered 2018-01-22: 12.5 ug via INTRAVENOUS

## 2018-01-22 MED ORDER — MUPIROCIN 2 % EX OINT
1.0000 "application " | TOPICAL_OINTMENT | Freq: Once | CUTANEOUS | Status: AC
  Administered 2018-01-22: 1 via TOPICAL
  Filled 2018-01-22: qty 22

## 2018-01-22 SURGICAL SUPPLY — 3 items
CABLE SURGICAL S-101-97-12 (CABLE) ×2 IMPLANT
PAD DEFIB LIFELINK (PAD) ×2 IMPLANT
TRAY PACEMAKER INSERTION (PACKS) ×2 IMPLANT

## 2018-01-22 NOTE — Progress Notes (Signed)
Dr. Lovena Le made aware of HR in the mid 30's.

## 2018-01-22 NOTE — Progress Notes (Signed)
I contacted Dr.Mclean on call cardiologist. I informed him that patient is concerned about his night time medications. Dr. Aundra Dubin stated that he didn't want to order any medications at this time. Pharmacy was contacted prior & stated that patients medication history has been completed. Will continue to monitor.

## 2018-01-22 NOTE — Progress Notes (Signed)
Dr. Aundra Dubin notified of patients EKG that was completed and in the chart. Dr. Aundra Dubin notified that patient has pacer pads in place. No new orders given.

## 2018-01-22 NOTE — Interval H&P Note (Signed)
History and Physical Interval Note:  01/22/2018 1:03 PM  Joshua [name]  has presented today for surgery, with the diagnosis of infected pacemaker system  The various methods of treatment have been discussed with the patient and family. After consideration of risks, benefits and other options for treatment, the patient has consented to  Procedure(s): PPM GENERATOR REMOVAL (N/A) LEAD EXTRACTION (N/A) as a surgical intervention .  The patient's history has been reviewed, patient examined, no change in status, stable for surgery.  I have reviewed the patient's chart and labs.  Questions were answered to the patient's satisfaction.     Cristopher Peru

## 2018-01-23 ENCOUNTER — Encounter (HOSPITAL_COMMUNITY): Payer: Self-pay | Admitting: Internal Medicine

## 2018-01-23 ENCOUNTER — Inpatient Hospital Stay (HOSPITAL_COMMUNITY): Payer: Medicare Other

## 2018-01-23 DIAGNOSIS — T827XXD Infection and inflammatory reaction due to other cardiac and vascular devices, implants and grafts, subsequent encounter: Secondary | ICD-10-CM

## 2018-01-23 LAB — BASIC METABOLIC PANEL
ANION GAP: 6 (ref 5–15)
BUN: 13 mg/dL (ref 6–20)
CALCIUM: 9 mg/dL (ref 8.9–10.3)
CO2: 27 mmol/L (ref 22–32)
Chloride: 108 mmol/L (ref 101–111)
Creatinine, Ser: 0.9 mg/dL (ref 0.61–1.24)
Glucose, Bld: 94 mg/dL (ref 65–99)
POTASSIUM: 4 mmol/L (ref 3.5–5.1)
SODIUM: 141 mmol/L (ref 135–145)

## 2018-01-23 LAB — CBC
HCT: 36.1 % — ABNORMAL LOW (ref 39.0–52.0)
Hemoglobin: 12.1 g/dL — ABNORMAL LOW (ref 13.0–17.0)
MCH: 30.3 pg (ref 26.0–34.0)
MCHC: 33.5 g/dL (ref 30.0–36.0)
MCV: 90.3 fL (ref 78.0–100.0)
PLATELETS: 280 10*3/uL (ref 150–400)
RBC: 4 MIL/uL — AB (ref 4.22–5.81)
RDW: 14.8 % (ref 11.5–15.5)
WBC: 13 10*3/uL — AB (ref 4.0–10.5)

## 2018-01-23 MED ORDER — CITALOPRAM HYDROBROMIDE 10 MG PO TABS
10.0000 mg | ORAL_TABLET | Freq: Every day | ORAL | Status: DC
  Administered 2018-01-23: 10 mg via ORAL
  Filled 2018-01-23: qty 1

## 2018-01-23 MED ORDER — VANCOMYCIN HCL IN DEXTROSE 1-5 GM/200ML-% IV SOLN
1000.0000 mg | INTRAVENOUS | Status: DC
  Administered 2018-01-24: 1000 mg via INTRAVENOUS
  Filled 2018-01-23 (×2): qty 200

## 2018-01-23 MED ORDER — CITALOPRAM HYDROBROMIDE 20 MG PO TABS
20.0000 mg | ORAL_TABLET | Freq: Every day | ORAL | Status: DC
  Administered 2018-01-24 – 2018-01-26 (×3): 20 mg via ORAL
  Filled 2018-01-23 (×3): qty 1

## 2018-01-23 MED ORDER — ATORVASTATIN CALCIUM 20 MG PO TABS
20.0000 mg | ORAL_TABLET | Freq: Every day | ORAL | Status: DC
  Administered 2018-01-23 – 2018-01-26 (×4): 20 mg via ORAL
  Filled 2018-01-23 (×4): qty 1

## 2018-01-23 MED ORDER — PROSIGHT PO TABS
1.0000 | ORAL_TABLET | Freq: Every day | ORAL | Status: DC
  Administered 2018-01-23: 1 via ORAL
  Filled 2018-01-23: qty 1

## 2018-01-23 MED ORDER — SPIRONOLACTONE 25 MG PO TABS
25.0000 mg | ORAL_TABLET | Freq: Every day | ORAL | Status: DC
  Administered 2018-01-23 – 2018-01-26 (×4): 25 mg via ORAL
  Filled 2018-01-23 (×4): qty 1

## 2018-01-23 MED ORDER — PROSIGHT PO TABS
1.0000 | ORAL_TABLET | Freq: Two times a day (BID) | ORAL | Status: DC
  Administered 2018-01-23 – 2018-01-26 (×6): 1 via ORAL
  Filled 2018-01-23 (×6): qty 1

## 2018-01-23 MED ORDER — PHENYTOIN SODIUM EXTENDED 100 MG PO CAPS
200.0000 mg | ORAL_CAPSULE | Freq: Two times a day (BID) | ORAL | Status: DC
  Administered 2018-01-23 – 2018-01-26 (×7): 200 mg via ORAL
  Filled 2018-01-23 (×7): qty 2

## 2018-01-23 MED ORDER — PHENOBARBITAL 32.4 MG PO TABS
64.8000 mg | ORAL_TABLET | Freq: Two times a day (BID) | ORAL | Status: DC
  Administered 2018-01-23 – 2018-01-26 (×6): 64.8 mg via ORAL
  Filled 2018-01-23 (×6): qty 2

## 2018-01-23 NOTE — Progress Notes (Signed)
Orthopedic Tech Progress Note Patient Details:  Joshua [name] 09-Jan-1932 [id number]  Ortho Devices Type of Ortho Device: Arm sling Ortho Device/Splint Location: lle Ortho Device/Splint Interventions: Application   Post Interventions Patient Tolerated: Well Instructions Provided: Care of device   [name] 01/23/2018, 10:01 AM

## 2018-01-23 NOTE — Progress Notes (Addendum)
Progress Note  Patient Name: Joshua [name] Date of Encounter: 01/23/2018  Primary Cardiologist: No primary care provider on file.   Subjective   Feeling well, denies pain at extraction site, no dizziness, near syncope or syncope, no CP or SOB.  Inpatient Medications    Scheduled Meds: . atorvastatin  20 mg Oral Daily  . citalopram  10 mg Oral Daily  . multivitamin  1 tablet Oral Daily  . phenytoin  200 mg Oral BID  . spironolactone  25 mg Oral Daily   Continuous Infusions: . vancomycin     PRN Meds: acetaminophen, ondansetron (ZOFRAN) IV   Vital Signs    Vitals:   01/22/18 1820 01/22/18 1849 01/22/18 2109 01/23/18 0510  BP: (!) 131/57 136/67 (!) 144/59 128/76  Pulse: (!) 47 (!) 43  (!) 57  Resp:  19  17  Temp:   97.6 F (36.4 C) 97.8 F (36.6 C)  TempSrc:   Oral Oral  SpO2: 93% 97% 98% 97%  Weight:    181 lb 3.5 oz (82.2 kg)  Height:        Intake/Output Summary (Last 24 hours) at 01/23/2018 0913 Last data filed at 01/23/2018 0825 Gross per 24 hour  Intake -  Output 1275 ml  Net -1275 ml   Filed Weights   01/22/18 0938 01/23/18 0510  Weight: 185 lb (83.9 kg) 181 lb 3.5 oz (82.2 kg)    Telemetry    SR/SB, variable heart block, Mobitz I and II, V rates 30's-40's, narrow QRS - Personally Reviewed  ECG    Looks junctional, 59bpm, narrow QRS - Personally Reviewed  Physical Exam   GEN: No acute distress.   Neck: No JVD Cardiac: RRR, no murmurs, rubs, or gallops.  Respiratory: CTA b/l. GI: Soft, nontender, non-distended  MS: No edema; No deformity. Neuro:  Nonfocal  Psych: Normal affect   L chest/extraction site: dressing is dry, no hematoma  Labs    Chemistry Recent Labs  Lab 01/19/18 1603 01/23/18 0434  NA 140 141  K 4.2 4.0  CL 103 108  CO2 24 27  GLUCOSE 69 94  BUN 15 13  CREATININE 0.80 0.90  CALCIUM 9.2 9.0  GFRNONAA 83 >60  GFRAA 95 >60  ANIONGAP  --  6     Hematology Recent Labs  Lab 01/19/18 1603 01/23/18 0434  WBC  10.5 13.0*  RBC 4.07* 4.00*  HGB 12.5* 12.1*  HCT 35.9* 36.1*  MCV 88 90.3  MCH 30.7 30.3  MCHC 34.8 33.5  RDW 14.8 14.8  PLT 318 280    Cardiac EnzymesNo results for input(s): TROPONINI in the last 168 hours. No results for input(s): TROPIPOC in the last 168 hours.   BNPNo results for input(s): BNP, PROBNP in the last 168 hours.   DDimer No results for input(s): DDIMER in the last 168 hours.   Radiology    No results found.  Cardiac Studies   06/02/16: TTE Study Conclusions - Left ventricle: The cavity size was normal. Wall thickness was   normal. Systolic function was normal. The estimated ejection   fraction was in the range of 60% to 65%. Although no diagnostic   regional wall motion abnormality was identified, this possibility   cannot be completely excluded on the basis of this study. There   was an increased relative contribution of atrial contraction to   ventricular filling, which may be due to hypovolemia. Left   ventricular diastolic function parameters were normal. Doppler  parameters are consistent with low ventricular filling pressure. - Mitral valve: Calcified annulus. - Pulmonary arteries: PA peak pressure: 34 mm Hg (S).  Patient Profile     84 y.o. male with PMHx of symptomatic advanced heart block w/PPM, HTN, CAD, COPD, HTN, HLD, seizure d/o  Assessment & Plan    1. PPM pocket infection     S/p pacer system extraction yesterday     Mobitz I and II noted, V rates slower last PM, 50's this AM     Asymptomatic  Plan for IV vanc today and tomorrow, plan for re-implant opposite side on Thursday  Telemetry reviewed with Dr. Lovena Le, given narrow QRS, less concerning  2. HTN    No coreg for now, BP looks OK   For questions or updates, please contact [place] Please consult [email or web address] for contact info under Cardiology/STEMI.      Signed, Baldwin Jamaica, PA-C  01/23/2018, 9:13 AM    EP attending  Patient seen and examined.  Agree with  the findings as noted above.  The patient is doing well status post extraction of an infected dual-chamber pacing system.  He has transient heart block which is asymptomatic.  His ventricular rates during sleep go down into the 30s but he is otherwise doing well.  He will continue intravenous vancomycin therapy for his pacemaker pocket infection, and I would anticipate proceeding with insertion of a new dual-chamber pacemaker in the contralateral side in the next 2 days.  If however he develops evidence of systemic infection, those plans would be delayed.  Cristopher Peru, MD

## 2018-01-23 NOTE — Progress Notes (Signed)
Patient is speaking with pharmacy over the phone in regards to Phenobabital. Patient states he takes this medication at home & this is being clarified with pharmacy now. Will follow physician's orders & continue to monitor patient closely.

## 2018-01-23 NOTE — Progress Notes (Signed)
Dr. Aundra Dubin notified that patient has not had any labs drawn this admission. I requested base labs due to patient's HR decreasing to 28. Dr. Aundra Dubin stated he does not want to get labs now but we can get labs in the morning. Pt is sitting up in bed & is asymptomatic. Will continue to monitor.

## 2018-01-24 MED ORDER — GENTAMICIN SULFATE 40 MG/ML IJ SOLN
80.0000 mg | INTRAMUSCULAR | Status: DC
  Filled 2018-01-24: qty 2

## 2018-01-24 MED ORDER — SODIUM CHLORIDE 0.9 % IV SOLN
80.0000 mg | INTRAVENOUS | Status: AC
  Administered 2018-01-25: 80 mg

## 2018-01-24 MED ORDER — VANCOMYCIN HCL IN DEXTROSE 1-5 GM/200ML-% IV SOLN
1000.0000 mg | Freq: Two times a day (BID) | INTRAVENOUS | Status: DC
Start: 2018-01-24 — End: 2018-01-26
  Administered 2018-01-24 – 2018-01-26 (×4): 1000 mg via INTRAVENOUS
  Filled 2018-01-24 (×4): qty 200

## 2018-01-24 MED ORDER — CHLORHEXIDINE GLUCONATE 4 % EX LIQD
60.0000 mL | Freq: Once | CUTANEOUS | Status: AC
  Administered 2018-01-24: 4 via TOPICAL

## 2018-01-24 MED ORDER — CHLORHEXIDINE GLUCONATE 4 % EX LIQD
60.0000 mL | Freq: Once | CUTANEOUS | Status: DC
  Filled 2018-01-24: qty 60

## 2018-01-24 MED ORDER — SODIUM CHLORIDE 0.9 % IV SOLN: INTRAVENOUS | Status: DC

## 2018-01-24 MED ORDER — VANCOMYCIN HCL IN DEXTROSE 1-5 GM/200ML-% IV SOLN: 1000.0000 mg | INTRAVENOUS | Status: DC

## 2018-01-24 NOTE — Consult Note (Signed)
[name] CM Primary Care Navigator  01/24/2018  Joshua [name] 1931/10/04 [id number]   Met with patient at the bedsideto identify possible discharge needs. Patient reports having "small opening to pacemaker site" which resulted to this admission.(pacemaker pocket infection)  Patientendorses Dr. Redge [name] with Joshua [name] as his primary care provider. He mentioned being close and having a good personal relationship with Dr. Laurance [name] and has his cell phone number in case he needs him.    [name]eto obtain medications without difficulty.   Patient reportsmanaginghismedications at Joshua [name] the assistance of private pay caregiver using "pill box" system filled every 2 weeks .  Patient mentionedthat he has been driving prior to admission but his son (Joshua [name]) or caregiver Joshua [name]) will be able to provide transportation to his doctors' appointments if needed after discharge.  Patient verbalizedliving alone at home but has a private pay caregiver who comes 5 hours/ day and a housekeeper, coming once a week. He reports that his next door neighbor Joshua [name]) will also be able to provide assistance with his care needs when needed.  Anticipateddischarge planishomeper patient.  Patientvoicedunderstandingto call primary care provider'soffice when hereturnshome,for a post discharge follow-up visit within1- 2weeksor sooner if needs arise.Patient letter (with PCP's contact number) was provided ashisreminder.   Discussed with patientregarding [facility] CM services available for health managementand resourcesat home butpatient haddeclinedservices. Patientrefused[facility]N services that wasoffered to himat different times,as well as EMMI calls to follow-up with his recovery.Patientreports that he is "managing self pretty well" and "don't think that services will be necessary for now".  Patient is knowledgeable of ways to manage HF and HTN.  Encouraged patientto seekreferralfrom primary care provider to [facility] care management ifhechanges hismind,and if deemednecessaryand appropriate for services in the near future.  [facility] care management information was provided for future needs that may arise.   For additional questions please contact:  Joshua [name], BSN, RN-BC [facility] PRIMARY CARE Navigator Cell: [phone]

## 2018-01-24 NOTE — Progress Notes (Signed)
Pharmacy Antibiotic Note  Joshua [name] is a 84 y.o. male admitted on 01/22/2018 with pocket infection.  Pharmacy has been consulted for vancomycin dosing.  Patient afebrile, wbc 13, renal function normal.   Plan: Vancomycin 1g IV every 12 hours.  Goal trough 10-15 mcg/mL.  Height: 6\' 1"  (185.4 cm) Weight: 181 lb 11.2 oz (82.4 kg) IBW/kg (Calculated) : 79.9  Temp (24hrs), Avg:97.9 F (36.6 C), Min:97.7 F (36.5 C), Max:98 F (36.7 C)  Recent Labs  Lab 01/19/18 1603 01/23/18 0434  WBC 10.5 13.0*  CREATININE 0.80 0.90    Estimated Creatinine Clearance: 70.3 mL/min (by C-G formula based on SCr of 0.9 mg/dL).    Allergies  Allergen Reactions  . Penicillins Rash    Has patient had a PCN reaction causing immediate rash, facial/tongue/throat swelling, SOB or lightheadedness with hypotension: ##Yes## Has patient had a PCN reaction causing severe rash involving mucus membranes or skin necrosis: No Has patient had a PCN reaction that required hospitalization No Has patient had a PCN reaction occurring within the last 10 years: No If all of the above answers are "NO", then may proceed with Cephalosporin use.    Thank you for allowing pharmacy to be a part of this patient's care.  Erin Hearing PharmD., BCPS Clinical Pharmacist 01/24/2018 6:46 PM

## 2018-01-24 NOTE — Progress Notes (Addendum)
Progress Note  Patient Name: Joshua [name] Date of Encounter: 01/24/2018  Primary Cardiologist: Dr. Tamala [name] Electrophysiologist: Dr. Rayann [name]  Subjective   Feeling well, denies pain at extraction site, no dizziness, near syncope or syncope, no CP or SOB.  Inpatient Medications    Scheduled Meds: . atorvastatin  20 mg Oral Daily  . citalopram  20 mg Oral Daily  . multivitamin  1 tablet Oral BID  . PHENobarbital  64.8 mg Oral BID  . phenytoin  200 mg Oral BID  . spironolactone  25 mg Oral Daily   Continuous Infusions: . vancomycin Stopped (01/24/18 0105)   PRN Meds: acetaminophen, ondansetron (ZOFRAN) IV   Vital Signs    Vitals:   01/22/18 2109 01/23/18 0510 01/23/18 2030 01/24/18 0502  BP: (!) 144/59 128/76 112/68 (!) 145/84  Pulse:  (!) 57 64 71  Resp:  17 15 15   Temp: 97.6 F (36.4 C) 97.8 F (36.6 C) 97.7 F (36.5 C) 97.9 F (36.6 C)  TempSrc: Oral Oral Oral Oral  SpO2: 98% 97% 97% 96%  Weight:  181 lb 3.5 oz (82.2 kg)  181 lb 11.2 oz (82.4 kg)  Height:        Intake/Output Summary (Last 24 hours) at 01/24/2018 0922 Last data filed at 01/24/2018 0200 Gross per 24 hour  Intake 240 ml  Output 1000 ml  Net -760 ml   Filed Weights   01/22/18 0938 01/23/18 0510 01/24/18 0502  Weight: 185 lb (83.9 kg) 181 lb 3.5 oz (82.2 kg) 181 lb 11.2 oz (82.4 kg)    Telemetry    SR/SB, variable heart block, Mobitz I and II, V rates 30's-40's overnight, 50's this AM, narrow QRS - Personally Reviewed  ECG    No new EKGs - Personally Reviewed  Physical Exam   GEN: No acute distress.   Neck: No JVD Cardiac: RRR, no murmurs, rubs, or gallops.  Respiratory: CTA b/l. GI: Soft, nontender, non-distended  MS: No edema; No deformity. Neuro:  Nonfocal  Psych: Normal affect   L chest/extraction site: dressing remains dry, no hematoma  Labs    Chemistry Recent Labs  Lab 01/19/18 1603 01/23/18 0434  NA 140 141  K 4.2 4.0  CL 103 108  CO2 24 27  GLUCOSE 69 94  BUN  15 13  CREATININE 0.80 0.90  CALCIUM 9.2 9.0  GFRNONAA 83 >60  GFRAA 95 >60  ANIONGAP  --  6     Hematology Recent Labs  Lab 01/19/18 1603 01/23/18 0434  WBC 10.5 13.0*  RBC 4.07* 4.00*  HGB 12.5* 12.1*  HCT 35.9* 36.1*  MCV 88 90.3  MCH 30.7 30.3  MCHC 34.8 33.5  RDW 14.8 14.8  PLT 318 280    Cardiac EnzymesNo results for input(s): TROPONINI in the last 168 hours. No results for input(s): TROPIPOC in the last 168 hours.   BNPNo results for input(s): BNP, PROBNP in the last 168 hours.   DDimer No results for input(s): DDIMER in the last 168 hours.   Radiology    Dg Chest 2 View Result Date: 01/23/2018 CLINICAL DATA:  Status post permanent pacemaker removal. EXAM: CHEST - 2 VIEW COMPARISON:  PA and lateral chest x-ray of June 16, 2017 FINDINGS: The lungs are well-expanded. The interstitial markings are coarse. There is no pneumothorax or pleural effusion. The pacemaker generator and electrodes have been removed. External pacemaker defibrillator pads are present. The heart and pulmonary vascularity are normal. There is calcification in the wall of  the aortic arch. There is surgical bandage material over the pacemaker generator site. The bony structures are unremarkable. IMPRESSION: There is no postprocedure complication following permanent pacemaker removal. Stable chronic bronchitic changes. Stable cardiomegaly without pulmonary vascular congestion. Thoracic aortic atherosclerosis. Electronically Signed   By: Joshua  [name] M.D.   On: 01/23/2018 09:40    Cardiac Studies   06/02/16: TTE Study Conclusions - Left ventricle: The cavity size was normal. Wall thickness was   normal. Systolic function was normal. The estimated ejection   fraction was in the range of 60% to 65%. Although no diagnostic   regional wall motion abnormality was identified, this possibility   cannot be completely excluded on the basis of this study. There   was an increased relative contribution of  atrial contraction to   ventricular filling, which may be due to hypovolemia. Left   ventricular diastolic function parameters were normal. Doppler   parameters are consistent with low ventricular filling pressure. - Mitral valve: Calcified annulus. - Pulmonary arteries: PA peak pressure: 34 mm Hg (S).  Patient Profile     84 y.o. male with PMHx of symptomatic advanced heart block w/PPM, HTN, CAD, COPD, HTN, HLD, seizure d/o  Assessment & Plan    1. PPM pocket infection     S/p pacer system extraction yesterday     Mobitz I and II noted, V rates slower last PM, 50's this AM     Asymptomatic  Continue IV vanc, planned for re-implant opposite side on Tomorrow Telemetry reviewed with Dr. Lovena [name], given narrow QRS, less concerning  2. HTN    No coreg for now, BP looks OK   For questions or updates, please contact Joshua [name] Please consult [email or web address] for contact info under Cardiology/STEMI.      Signed, Joshua Jamaica, PA-C  01/24/2018, 9:22 AM    EP attending  Patient seen and examined.  Agree with the findings as noted above.  Joshua [name] continues to do well after extraction of an infected pacing system.  He has no fevers or chills.  He has intermittent complete heart block with a narrow QRS escape and is essentially asymptomatic with this rhythm.  He will undergo insertion of a permanent dual-chamber pacemaker tomorrow, with the expectation for discharge home the day after.  He will continue intravenous antibiotics.  Joshua Peru, MD

## 2018-01-25 ENCOUNTER — Encounter (HOSPITAL_COMMUNITY): Payer: Self-pay | Admitting: Internal Medicine

## 2018-01-25 ENCOUNTER — Encounter (HOSPITAL_COMMUNITY)
Admission: RE | Disposition: A | Discharge status: Home / Self Care | Payer: Self-pay | Source: Ambulatory Visit | Attending: Internal Medicine

## 2018-01-25 DIAGNOSIS — I442 Atrioventricular block, complete: Secondary | ICD-10-CM

## 2018-01-25 HISTORY — PX: PACEMAKER IMPLANT: EP1218

## 2018-01-25 LAB — BASIC METABOLIC PANEL
ANION GAP: 7 (ref 5–15)
BUN: 16 mg/dL (ref 6–20)
CALCIUM: 9.1 mg/dL (ref 8.9–10.3)
CO2: 28 mmol/L (ref 22–32)
Chloride: 105 mmol/L (ref 101–111)
Creatinine, Ser: 1 mg/dL (ref 0.61–1.24)
GFR calc non Af Amer: >60 mL/min (ref >60)
Glucose, Bld: 104 mg/dL — ABNORMAL HIGH (ref 65–99)
Potassium: 4.2 mmol/L (ref 3.5–5.1)
Sodium: 140 mmol/L (ref 135–145)

## 2018-01-25 SURGERY — PACEMAKER IMPLANT

## 2018-01-25 MED ORDER — HEPARIN (PORCINE) IN NACL 2-0.9 UNITS/ML
INTRAMUSCULAR | Status: AC | PRN
  Administered 2018-01-25: 500 mL

## 2018-01-25 MED ORDER — VANCOMYCIN HCL IN DEXTROSE 1-5 GM/200ML-% IV SOLN
INTRAVENOUS | Status: AC
  Filled 2018-01-25: qty 200

## 2018-01-25 MED ORDER — IOPAMIDOL (ISOVUE-370) INJECTION 76%
INTRAVENOUS | Status: DC | PRN
  Administered 2018-01-25: 10 mL via INTRAVENOUS

## 2018-01-25 MED ORDER — LIDOCAINE HCL (PF) 1 % IJ SOLN
INTRAMUSCULAR | Status: AC
  Filled 2018-01-25: qty 30

## 2018-01-25 MED ORDER — MIDAZOLAM HCL 5 MG/5ML IJ SOLN
INTRAMUSCULAR | Status: DC | PRN
  Administered 2018-01-25 (×3): 1 mg via INTRAVENOUS

## 2018-01-25 MED ORDER — HEPARIN (PORCINE) IN NACL 1000-0.9 UT/500ML-% IV SOLN
INTRAVENOUS | Status: AC
  Filled 2018-01-25: qty 500

## 2018-01-25 MED ORDER — MIDAZOLAM HCL 5 MG/5ML IJ SOLN
INTRAMUSCULAR | Status: AC
  Filled 2018-01-25: qty 5

## 2018-01-25 MED ORDER — SODIUM CHLORIDE 0.9 % IV SOLN
INTRAVENOUS | Status: AC
  Filled 2018-01-25: qty 2

## 2018-01-25 MED ORDER — FENTANYL CITRATE (PF) 100 MCG/2ML IJ SOLN
INTRAMUSCULAR | Status: DC | PRN
  Administered 2018-01-25 (×3): 12.5 ug via INTRAVENOUS

## 2018-01-25 MED ORDER — IOPAMIDOL (ISOVUE-370) INJECTION 76%
INTRAVENOUS | Status: AC
  Filled 2018-01-25: qty 50

## 2018-01-25 MED ORDER — FENTANYL CITRATE (PF) 100 MCG/2ML IJ SOLN
INTRAMUSCULAR | Status: AC
  Filled 2018-01-25: qty 2

## 2018-01-25 MED ORDER — LIDOCAINE HCL (PF) 1 % IJ SOLN
INTRAMUSCULAR | Status: DC | PRN
  Administered 2018-01-25: 60 mL

## 2018-01-25 SURGICAL SUPPLY — 12 items
ADAPTER SEALING SSSA-09 (ADAPTER) ×3 IMPLANT
CABLE SURGICAL S-101-97-12 (CABLE) ×3 IMPLANT
CATH RIGHTSITE C315HIS02 (CATHETERS) ×3 IMPLANT
IPG PACE AZUR XT DR MRI W1DR01 (Pacemaker) ×1 IMPLANT
LEAD CAPSURE NOVUS 45CM (Lead) ×3 IMPLANT
LEAD SELECT SECURE 3830 383069 (Lead) ×1 IMPLANT
PACE AZURE XT DR MRI W1DR01 (Pacemaker) ×3 IMPLANT
PAD DEFIB LIFELINK (PAD) ×3 IMPLANT
SELECT SECURE 3830 383069 (Lead) ×3 IMPLANT
SHEATH CLASSIC 7F (SHEATH) ×3 IMPLANT
SHEATH CLASSIC 9F (SHEATH) ×3 IMPLANT
TRAY PACEMAKER INSERTION (PACKS) ×3 IMPLANT

## 2018-01-26 ENCOUNTER — Inpatient Hospital Stay (HOSPITAL_COMMUNITY): Payer: Medicare Other

## 2018-01-26 DIAGNOSIS — I442 Atrioventricular block, complete: Secondary | ICD-10-CM

## 2018-01-26 LAB — VANCOMYCIN, TROUGH: VANCOMYCIN TR: 16 ug/mL (ref 15–20)

## 2018-01-26 LAB — GLUCOSE, CAPILLARY: GLUCOSE-CAPILLARY: 99 mg/dL (ref 65–99)

## 2018-01-26 MED ORDER — VANCOMYCIN HCL IN DEXTROSE 750-5 MG/150ML-% IV SOLN
750.0000 mg | Freq: Two times a day (BID) | INTRAVENOUS | Status: DC
  Filled 2018-01-26: qty 150

## 2018-01-26 NOTE — Care Management Important Message (Signed)
Important Message  Patient Details  Name: Joshua [name] MRN: [id number] Date of Birth: [birth year]   Medicare Important Message Given:  Yes    Therron Sells P Lisandro Meggett 01/26/2018, 1:25 PM

## 2018-01-26 NOTE — Discharge Summary (Addendum)
ELECTROPHYSIOLOGY PROCEDURE DISCHARGE SUMMARY    Patient ID: Joshua [name],  MRN: [id number], DOB/AGE: 1931-12-03 84 y.o.  Admit date: 01/22/2018 Discharge date: 01/26/2018  Primary Care Physician: Chipper Herb, MD  Primary Cardiologist: Dr. Tamala Julian Electrophysiologist: Dr. Rayann Heman  Primary Discharge Diagnosis:  1. Pacemaker pocket infection 2. Advanced heart block  Secondary Discharge Diagnosis:  1. HTN 2. CAD 3. COPD 4. Seizure d/o  Allergies  Allergen Reactions  . Penicillins Rash    Has patient had a PCN reaction causing immediate rash, facial/tongue/throat swelling, SOB or lightheadedness with hypotension: ##Yes## Has patient had a PCN reaction causing severe rash involving mucus membranes or skin necrosis: No Has patient had a PCN reaction that required hospitalization No Has patient had a PCN reaction occurring within the last 10 years: No If all of the above answers are "NO", then may proceed with Cephalosporin use.      Procedures This Admission:  1. PPM system extraction 01/22/18, Dr. Lovena Le     Conclusion: Successful extraction of a Medtronic dual-chamber pacing system in a patient with symptomatic heart block, who developed initial pacemaker pocket hematoma, followed by opening of the skin over the device, approximately 9 months after the initial device implantation.  2.  Implantation of a MDT dual chamber PPM on 01/25/18 by Dr Jannet Askew.  The patient received a  Medtronic (serial number [id number]) pacemaker, Medtronic [id number] (serial number [id number]) right atrial lead and a Medtronic [id number] (serial number [id number]) right ventricular lead  There were no immediate post procedure complications. 2.  CXR on 01/26/18 demonstrated no pneumothorax status post device implantation.   Brief HPI: Joshua [name] is a 84 y.o. male has hx of advanced heart block w/PPM that became infected with device erosion.  Risks, benefits, and alternatives to PPM extraction and new device  implantation were reviewed with the patient who wished to proceed.   Hospital Course:  The patient was admitted and underwent PPM system extraction on 01/22/18, as noted above, he was treated with IV antibiotics and underwent implantation of a new PPM to the opposite side (R) with details as outlined above.  He was monitored on telemetry throughout his stay, with generally 2:1 AVblock with V rates 40's-50's to SRw/V paced rhythm since the time of his new implant. Left chest was without hematoma or ecchymosis, sutures in place.  R chest site is dry, no hematoma.  The device was interrogated and found to be functioning normally.  CXR was obtained and demonstrated no pneumothorax status post device implantation.  Wound care, arm mobility, and restrictions were reviewed with the patient.  Sites look very good, no plans for antibotics going forward.  Patient requests [place] office f/u being more convenient for him, will arrange this.  The patient feels well, denies any pain at either site, no CP or SOB, he was examined by Dr. Lovena Le and considered stable for discharge to home.    Physical Exam: Vitals:   01/25/18 0942 01/25/18 1339 01/25/18 2056 01/26/18 0608  BP:  119/65 (!) 155/70 (!) 141/63  Pulse: (!) 0 64 63   Resp: (!) 0  16   Temp:  98.2 F (36.8 C) 98 F (36.7 C) 97.8 F (36.6 C)  TempSrc:  Oral Oral Oral  SpO2: (!) 0% 93% 93%   Weight:    176 lb 5.9 oz (80 kg)  Height:        GEN- The patient is well appearing, alert and oriented x 3 today.  HEENT: normocephalic, atraumatic; sclera clear, conjunctiva pink; hearing intact; oropharynx clear; neck supple, no JVP Lungs- CTA b/l, normal work of breathing.  No wheezes, rales, rhonchi Heart- RRR, no murmurs, rubs or gallops, PMI not laterally displaced GI- soft, non-tender, non-distended Extremities- no clubbing, cyanosis, or edema MS- no significant deformity or atrophy Skin- warm and dry, no rash or lesion, left chest is dry, without  hematoma/ecchymosis, sutures in place, R chest is dry, no hematoma or echymosis Psych- euthymic mood, full affect Neuro- no gross deficits   Labs:   Lab Results  Component Value Date   WBC 13.0 (H) 01/23/2018   HGB 12.1 (L) 01/23/2018   HCT 36.1 (L) 01/23/2018   MCV 90.3 01/23/2018   PLT 280 01/23/2018    Recent Labs  Lab 01/25/18 0517  NA 140  K 4.2  CL 105  CO2 28  BUN 16  CREATININE 1.00  CALCIUM 9.1  GLUCOSE 104*    Discharge Medications:  Allergies as of 01/26/2018      Reactions   Penicillins Rash   Has patient had a PCN reaction causing immediate rash, facial/tongue/throat swelling, SOB or lightheadedness with hypotension: ##Yes## Has patient had a PCN reaction causing severe rash involving mucus membranes or skin necrosis: No Has patient had a PCN reaction that required hospitalization No Has patient had a PCN reaction occurring within the last 10 years: No If all of the above answers are "NO", then may proceed with Cephalosporin use.      Medication List    TAKE these medications   aspirin EC 81 MG tablet Take 1 tablet (81 mg total) by mouth daily.   atorvastatin 20 MG tablet Commonly known as:  LIPITOR Take 1 tablet (20 mg total) by mouth daily.   azithromycin 250 MG tablet Commonly known as:  ZITHROMAX As directed Notes to patient:  Please confirm this medicine with the prescribing doctor   calcium carbonate 1500 (600 Ca) MG Tabs tablet Commonly known as:  OSCAL Take 600 mg of elemental calcium by mouth daily with breakfast.   citalopram 20 MG tablet Commonly known as:  CELEXA Take 1 tablet (20 mg total) by mouth daily. Notes to patient:  Please confirm dosage with the prescribing doctor   clopidogrel 75 MG tablet Commonly known as:  PLAVIX Take 1 tablet (75 mg total) by mouth daily. Notes to patient:  Resume Monday 01/29/18   MAGNESIUM PO Take 1 tablet by mouth at bedtime.   PHENobarbital 64.8 MG tablet Commonly known as:   LUMINAL Take 1 tablet (64.8 mg total) by mouth 2 (two) times daily.   phenytoin 100 MG ER capsule Commonly known as:  DILANTIN Take 2 capsules (200 mg total) by mouth 2 (two) times daily.   PRESERVISION AREDS 2 Caps Take 1 capsule by mouth 2 (two) times daily.   spironolactone 25 MG tablet Commonly known as:  ALDACTONE Take 1 tablet (25 mg total) by mouth daily.   Vitamin D3 1000 units Caps Take 1 capsule by mouth daily.       Disposition:  Home  Discharge Instructions    Diet - low sodium heart healthy   Complete by:  As directed    Increase activity slowly   Complete by:  As directed      Follow-up Information    [facility] Follow up on 02/01/2018.   Specialty:  Cardiology Why:  3:00PM, wound check visit Contact information: [address] [place]       Evans Lance, MD Follow up on 05/07/2018.   Specialty:  Cardiology Why:  9:00AM Contact information: [place] [address] [phone]           Duration of Discharge Encounter: Greater than 30 minutes including physician time.  Venetia Night, PA-C 01/26/2018 10:34 AM  EP Attending  Patient seen and examined. Agree with above. He is stable for DC home. Usual followup. I can see him in our [place] office if he is so inclined. Interrogation of his PPM demonstrates a reduction in R waves with a good pacing threshold. Because he has CHB, I think we can follow this. CXR is ok.   Mikle Bosworth.D.

## 2018-01-26 NOTE — Discharge Instructions (Signed)
Pacemaker Implantation, Adult, Care After This sheet gives you information about how to care for yourself after your procedure. Your health care provider may also give you more specific instructions. If you have problems or questions, contact your health care provider. What can I expect after the procedure? After the procedure, it is common to have:  Mild pain.  Slight bruising.  Some swelling over the incision.  A slight bump over the skin where the device was placed. Sometimes, it is possible to feel the device under the skin. This is normal.  Follow these instructions at home: Medicines  Take over-the-counter and prescription medicines only as told by your health care provider.  If you were prescribed an antibiotic medicine, take it as told by your health care provider. Do not stop taking the antibiotic even if you start to feel better. Wound care  Do not remove the bandage on your chest until directed to do so by your health care provider.  After your bandage is removed, you may see pieces of tape called skin adhesive strips over the area where the cut was made (incision site). Let them fall off on their own.  Check the incision site every day to make sure it is not infected, bleeding, or starting to pull apart.  Do not use lotions or ointments near the incision site unless directed to do so.  Keep the incision area clean and dry for 2-3 days after the procedure or as directed by your health care provider. It takes several weeks for the incision site to completely heal.  Do not take baths, swim, or use a hot tub for 7-10 days or as otherwise directed by your health care provider. Activity  Do not drive or use heavy machinery while taking prescription pain medicine.  Do not drive for 24 hours if you were given a medicine to help you relax (sedative).  Check with your health care provider before you start to drive or play sports.  Avoid sudden jerking, pulling, or chopping  movements that pull your upper arm far away from your body. Avoid these movements for at least 6 weeks or as long as told by your health care provider.  Do not lift your upper arm above your shoulders for at least 6 weeks or as long as told by your health care provider. This means no tennis, golf, or swimming.  You may go back to work when your health care provider says it is okay. Pacemaker care  You may be shown how to transfer data from your pacemaker through the phone to your health care provider.  Always let all health care providers know about your pacemaker before you have any medical procedures or tests.  Wear a medical ID bracelet or necklace stating that you have a pacemaker. Carry a pacemaker ID card with you at all times.  Your pacemaker battery will last for 5-15 years. Routine checks by your health care provider will let the health care provider know when the battery is starting to run down. The pacemaker will need to be replaced when the battery starts to run down.  Do not use amateur radio equipment or electric welding torches. Other electrical devices are safe to use, including power tools, lawn mowers, and speakers. If you are unsure of whether something is safe to use, ask your health care provider.  When using your cell phone, hold it to the ear opposite the pacemaker. Do not leave your cell phone in a pocket over the pacemaker.    pacemaker.  Avoid places or objects that have a strong electric or magnetic field, including: ? Airport Herbalist. When at the airport, let officials know that you have a pacemaker. ? Power plants. ? Large electrical generators. ? Radiofrequency transmission towers, such as cell phone and radio towers. General instructions  Weigh yourself every day. If you suddenly gain weight, fluid may be building up in your body.  Keep all follow-up visits as told by your health care provider. This is important. Contact a health care provider if:  You  gain weight suddenly.  Your legs or feet swell.  It feels like your heart is fluttering or skipping beats (heart palpitations).  You have chills or a fever.  You have more redness, swelling, or pain around your incisions.  You have more fluid or blood coming from your incisions.  Your incisions feel warm to the touch.  You have pus or a bad smell coming from your incisions. Get help right away if:  You have chest pain.  You have trouble breathing or are short of breath.  You become extremely tired.  You are light-headed or you faint. This information is not intended to replace advice given to you by your health care provider. Make sure you discuss any questions you have with your health care provider. Document Released: 03/25/2005 Document Revised: 06/17/2016 Document Reviewed: 06/17/2016 Elsevier Interactive Patient Education  2018 [employer].   Pacemaker Implantation, Adult, Care After This sheet gives you information about how to care for yourself after your procedure. Your health care provider may also give you more specific instructions. If you have problems or questions, contact your health care provider. What can I expect after the procedure? After the procedure, it is common to have:  Mild pain.  Slight bruising.  Some swelling over the incision.  A slight bump over the skin where the device was placed. Sometimes, it is possible to feel the device under the skin. This is normal.  Follow these instructions at home: Medicines  Take over-the-counter and prescription medicines only as told by your health care provider.  If you were prescribed an antibiotic medicine, take it as told by your health care provider. Do not stop taking the antibiotic even if you start to feel better. Wound care  Do not remove the bandage on your chest until directed to do so by your health care provider.  After your bandage is removed, you may see pieces of tape called skin  adhesive strips over the area where the cut was made (incision site). Let them fall off on their own.  Check the incision site every day to make sure it is not infected, bleeding, or starting to pull apart.  Do not use lotions or ointments near the incision site unless directed to do so.  Keep the incision area clean and dry for 2-3 days after the procedure or as directed by your health care provider. It takes several weeks for the incision site to completely heal.  Do not take baths, swim, or use a hot tub for 7-10 days or as otherwise directed by your health care provider. Activity  Do not drive or use heavy machinery while taking prescription pain medicine.  Do not drive for 24 hours if you were given a medicine to help you relax (sedative).  Check with your health care provider before you start to drive or play sports.  Avoid sudden jerking, pulling, or chopping movements that pull your upper arm far away from your  body. Avoid these movements for at least 6 weeks or as long as told by your health care provider.  Do not lift your upper arm above your shoulders for at least 6 weeks or as long as told by your health care provider. This means no tennis, golf, or swimming.  You may go back to work when your health care provider says it is okay. Pacemaker care  You may be shown how to transfer data from your pacemaker through the phone to your health care provider.  Always let all health care providers know about your pacemaker before you have any medical procedures or tests.  Wear a medical ID bracelet or necklace stating that you have a pacemaker. Carry a pacemaker ID card with you at all times.  Your pacemaker battery will last for 5-15 years. Routine checks by your health care provider will let the health care provider know when the battery is starting to run down. The pacemaker will need to be replaced when the battery starts to run down.  Do not use amateur Nurse, children's. Other electrical devices are safe to use, including power tools, lawn mowers, and speakers. If you are unsure of whether something is safe to use, ask your health care provider.  When using your cell phone, hold it to the ear opposite the pacemaker. Do not leave your cell phone in a pocket over the pacemaker.  Avoid places or objects that have a strong electric or magnetic field, including: ? Airport Herbalist. When at the airport, let officials know that you have a pacemaker. ? Power plants. ? Large electrical generators. ? Radiofrequency transmission towers, such as cell phone and radio towers. General instructions  Weigh yourself every day. If you suddenly gain weight, fluid may be building up in your body.  Keep all follow-up visits as told by your health care provider. This is important. Contact a health care provider if:  You gain weight suddenly.  Your legs or feet swell.  It feels like your heart is fluttering or skipping beats (heart palpitations).  You have chills or a fever.  You have more redness, swelling, or pain around your incisions.  You have more fluid or blood coming from your incisions.  Your incisions feel warm to the touch.  You have pus or a bad smell coming from your incisions. Get help right away if:  You have chest pain.  You have trouble breathing or are short of breath.  You become extremely tired.  You are light-headed or you faint. This information is not intended to replace advice given to you by your health care provider. Make sure you discuss any questions you have with your health care provider. Document Released: 03/25/2005 Document Revised: 06/17/2016 Document Reviewed: 06/17/2016 Elsevier Interactive Patient Education  2018 [employer].   Pacemaker Implantation, Adult, Care After This sheet gives you information about how to care for yourself after your procedure. Your health care provider may also  give you more specific instructions. If you have problems or questions, contact your health care provider. What can I expect after the procedure? After the procedure, it is common to have:  Mild pain.  Slight bruising.  Some swelling over the incision.  A slight bump over the skin where the device was placed. Sometimes, it is possible to feel the device under the skin. This is normal.  Follow these instructions at home: Medicines  Take over-the-counter and prescription medicines only as told by your  health care provider.  If you were prescribed an antibiotic medicine, take it as told by your health care provider. Do not stop taking the antibiotic even if you start to feel better. Wound care  Do not remove the bandage on your chest until directed to do so by your health care provider.  After your bandage is removed, you may see pieces of tape called skin adhesive strips over the area where the cut was made (incision site). Let them fall off on their own.  Check the incision site every day to make sure it is not infected, bleeding, or starting to pull apart.  Do not use lotions or ointments near the incision site unless directed to do so.  Keep the incision area clean and dry for 2-3 days after the procedure or as directed by your health care provider. It takes several weeks for the incision site to completely heal.  Do not take baths, swim, or use a hot tub for 7-10 days or as otherwise directed by your health care provider. Activity  Do not drive or use heavy machinery while taking prescription pain medicine.  Do not drive for 24 hours if you were given a medicine to help you relax (sedative).  Check with your health care provider before you start to drive or play sports.  Avoid sudden jerking, pulling, or chopping movements that pull your upper arm far away from your body. Avoid these movements for at least 6 weeks or as long as told by your health care provider.  Do not  lift your upper arm above your shoulders for at least 6 weeks or as long as told by your health care provider. This means no tennis, golf, or swimming.  You may go back to work when your health care provider says it is okay. Pacemaker care  You may be shown how to transfer data from your pacemaker through the phone to your health care provider.  Always let all health care providers know about your pacemaker before you have any medical procedures or tests.  Wear a medical ID bracelet or necklace stating that you have a pacemaker. Carry a pacemaker ID card with you at all times.  Your pacemaker battery will last for 5-15 years. Routine checks by your health care provider will let the health care provider know when the battery is starting to run down. The pacemaker will need to be replaced when the battery starts to run down.  Do not use amateur Chief of Staff. Other electrical devices are safe to use, including power tools, lawn mowers, and speakers. If you are unsure of whether something is safe to use, ask your health care provider.  When using your cell phone, hold it to the ear opposite the pacemaker. Do not leave your cell phone in a pocket over the pacemaker.  Avoid places or objects that have a strong electric or magnetic field, including: ? Airport Herbalist. When at the airport, let officials know that you have a pacemaker. ? Power plants. ? Large electrical generators. ? Radiofrequency transmission towers, such as cell phone and radio towers. General instructions  Weigh yourself every day. If you suddenly gain weight, fluid may be building up in your body.  Keep all follow-up visits as told by your health care provider. This is important. Contact a health care provider if:  You gain weight suddenly.  Your legs or feet swell.  It feels like your heart is fluttering or skipping beats (heart  palpitations).  You have chills or a fever.  You  have more redness, swelling, or pain around your incisions.  You have more fluid or blood coming from your incisions.  Your incisions feel warm to the touch.  You have pus or a bad smell coming from your incisions. Get help right away if:  You have chest pain.  You have trouble breathing or are short of breath.  You become extremely tired.  You are light-headed or you faint. This information is not intended to replace advice given to you by your health care provider. Make sure you discuss any questions you have with your health care provider. Document Released: 03/25/2005 Document Revised: 06/17/2016 Document Reviewed: 06/17/2016 Elsevier Interactive Patient Education  2018 [place] Discharge Instructions for  Pacemaker/Defibrillator Patients  Activity No heavy lifting or vigorous activity with your right arm for 6 to 8 weeks.  Do not raise your right arm above your head for one week.  Gradually raise your affected arm as drawn below.              01/29/18                    01/30/18                    01/31/18                  02/01/18 __  NO DRIVING for  1 week   ; you may begin driving on   2/99/37  .  WOUND CARE - Keep the both wound areas clean and dry.  Do not get this area wet, no showers until cleared to at your wound check visit . - The tape/steri-strips on your wound will fall off; do not pull them off.  No bandage is needed on the site.  DO  NOT apply any creams, oils, or ointments to the wound area. - If you notice any drainage or discharge from the wound, any swelling or bruising at the site, or you develop a fever > 101? F after you are discharged home, call the office at once.  Special Instructions - You are still able to use cellular telephones; use the ear opposite the side where you have your pacemaker/defibrillator.  Avoid carrying your cellular phone near your device. - When traveling through airports, show security personnel your  identification card to avoid being screened in the metal detectors.  Ask the security personnel to use the hand wand. - Avoid arc welding equipment, MRI testing (magnetic resonance imaging), TENS units (transcutaneous nerve stimulators).  Call the office for questions about other devices. - Avoid electrical appliances that are in poor condition or are not properly grounded. - Microwave ovens are safe to be near or to operate.  Additional information for defibrillator patients should your device go off: - If your device goes off ONCE and you feel fine afterward, notify the device clinic nurses. - If your device goes off ONCE and you do not feel well afterward, call 911. - If your device goes off TWICE, call 911. - If your device goes off THREE times in one day, call 911.  DO NOT DRIVE YOURSELF OR A FAMILY MEMBER WITH A DEFIBRILLATOR TO THE HOSPITAL--CALL 911.

## 2018-01-26 NOTE — Progress Notes (Signed)
Pharmacy Antibiotic Note  Joshua [name] is a 84 y.o. male admitted on 01/22/2018 with pocket infection.  Pharmacy has been consulted for vancomycin dosing.  Trough is 16 and just above goal. Renal function stable.  Plan: Decrease to Vancomycin 750 mg IV every 12 hours.  Goal trough 10-15 mcg/mL.  Plan is for discharge today  Height: 6\' 1"  (185.4 cm) Weight: 176 lb 5.9 oz (80 kg) IBW/kg (Calculated) : 79.9  Temp (24hrs), Avg:98 F (36.7 C), Min:97.8 F (36.6 C), Max:98.2 F (36.8 C)  Recent Labs  Lab 01/19/18 1603 01/23/18 0434 01/25/18 0517 01/26/18 0715  WBC 10.5 13.0*  --   --   CREATININE 0.80 0.90 1.00  --   VANCOTROUGH  --   --   --  16    Estimated Creatinine Clearance: 63.3 mL/min (by C-G formula based on SCr of 1 mg/dL).    Allergies  Allergen Reactions  . Penicillins Rash    Has patient had a PCN reaction causing immediate rash, facial/tongue/throat swelling, SOB or lightheadedness with hypotension: ##Yes## Has patient had a PCN reaction causing severe rash involving mucus membranes or skin necrosis: No Has patient had a PCN reaction that required hospitalization No Has patient had a PCN reaction occurring within the last 10 years: No If all of the above answers are "NO", then may proceed with Cephalosporin use.    Thank you for allowing pharmacy to be a part of this patient's care.  Renold Genta, PharmD, BCPS Clinical Pharmacist Clinical phone for 01/26/2018 until 4p is [phone] After 4p, please call Main Rx at [phone] for assistance 01/26/2018 9:45 AM

## 2018-02-01 ENCOUNTER — Ambulatory Visit (INDEPENDENT_AMBULATORY_CARE_PROVIDER_SITE_OTHER): Payer: Medicare Other | Admitting: *Deleted

## 2018-02-01 DIAGNOSIS — I441 Atrioventricular block, second degree: Secondary | ICD-10-CM

## 2018-02-01 LAB — CUP PACEART INCLINIC DEVICE CHECK
Brady Statistic RV Percent Paced: 99.3 %
Date Time Interrogation Session: 20190516153659
Implantable Lead Implant Date: 20190509
Implantable Lead Location: 753859
Implantable Lead Location: 753860
Implantable Lead Model: 3830
Implantable Pulse Generator Implant Date: 20190509
Lead Channel Impedance Value: 513 Ohm
Lead Channel Impedance Value: 532 Ohm
Lead Channel Pacing Threshold Amplitude: 1 V
Lead Channel Pacing Threshold Amplitude: 1.25 V
Lead Channel Pacing Threshold Pulse Width: 0.4 ms
Lead Channel Sensing Intrinsic Amplitude: 1.9 mV
MDC IDC LEAD IMPLANT DT: 20190509
MDC IDC MSMT LEADCHNL RV PACING THRESHOLD PULSEWIDTH: 0.5 ms
MDC IDC MSMT LEADCHNL RV SENSING INTR AMPL: 2.5 mV
MDC IDC STAT BRADY RA PERCENT PACED: 26.9 %

## 2018-02-01 NOTE — Progress Notes (Signed)
Wound check appointment. Steri-strips removed from right incision. Wound without redness or edema. Incision edges approximated, wound well healed. Sutures removed from left incision site incision edges approximated and wound well healed. Normal device function. Thresholds, sensing, and impedances consistent with implant measurements. Device programmed at 3.5V programmed on for extra safety margin until 3 month visit. Histogram distribution appropriate for patient and level of activity. No mode switches or high ventricular rates noted. Patient educated about wound care, arm mobility, lifting restrictions. ROV 05/07/18 w/ GT.

## 2018-02-06 ENCOUNTER — Ambulatory Visit: Payer: Medicare Other

## 2018-02-19 DIAGNOSIS — D225 Melanocytic nevi of trunk: Secondary | ICD-10-CM | POA: Diagnosis not present

## 2018-02-19 DIAGNOSIS — Z85828 Personal history of other malignant neoplasm of skin: Secondary | ICD-10-CM | POA: Diagnosis not present

## 2018-02-19 DIAGNOSIS — D1801 Hemangioma of skin and subcutaneous tissue: Secondary | ICD-10-CM | POA: Diagnosis not present

## 2018-02-19 DIAGNOSIS — L821 Other seborrheic keratosis: Secondary | ICD-10-CM | POA: Diagnosis not present

## 2018-02-27 DIAGNOSIS — H353221 Exudative age-related macular degeneration, left eye, with active choroidal neovascularization: Secondary | ICD-10-CM | POA: Diagnosis not present

## 2018-02-27 DIAGNOSIS — H353211 Exudative age-related macular degeneration, right eye, with active choroidal neovascularization: Secondary | ICD-10-CM | POA: Diagnosis not present

## 2018-03-19 ENCOUNTER — Telehealth: Payer: Self-pay | Admitting: Family Medicine

## 2018-03-19 MED ORDER — PHENYTOIN SODIUM EXTENDED 100 MG PO CAPS: 200.0000 mg | ORAL_CAPSULE | Freq: Two times a day (BID) | ORAL | 3 refills | Status: DC

## 2018-03-19 MED ORDER — CITALOPRAM HYDROBROMIDE 20 MG PO TABS: 20.0000 mg | ORAL_TABLET | Freq: Every day | ORAL | 3 refills | Status: DC

## 2018-03-19 MED ORDER — CLOPIDOGREL BISULFATE 75 MG PO TABS: 75.0000 mg | ORAL_TABLET | Freq: Every day | ORAL | 3 refills | Status: DC

## 2018-03-19 NOTE — Telephone Encounter (Signed)
meds sent in for pt - aware

## 2018-03-19 NOTE — Telephone Encounter (Signed)
PT is wanting to speak to jamie about getting refills on his medicine, when I asked pt which prescriptions he is needing refills on, he said he wanted to talk to [facility] about it.

## 2018-03-20 ENCOUNTER — Other Ambulatory Visit: Payer: Self-pay | Admitting: *Deleted

## 2018-03-21 MED ORDER — PHENOBARBITAL 64.8 MG PO TABS
64.8000 mg | ORAL_TABLET | Freq: Two times a day (BID) | ORAL | 3 refills | Status: DC
Start: 2018-03-21 — End: 2018-03-21

## 2018-03-21 MED ORDER — PHENOBARBITAL 64.8 MG PO TABS: 64.8000 mg | ORAL_TABLET | Freq: Two times a day (BID) | ORAL | 3 refills | Status: DC

## 2018-03-21 NOTE — Addendum Note (Signed)
Addended by: Zannie Cove on: 03/21/2018 02:30 PM   Modules accepted: Orders

## 2018-03-21 NOTE — Addendum Note (Signed)
Addended by: Zannie Cove on: 03/21/2018 08:42 AM   Modules accepted: Orders

## 2018-03-29 DIAGNOSIS — H353211 Exudative age-related macular degeneration, right eye, with active choroidal neovascularization: Secondary | ICD-10-CM | POA: Diagnosis not present

## 2018-03-29 DIAGNOSIS — H353221 Exudative age-related macular degeneration, left eye, with active choroidal neovascularization: Secondary | ICD-10-CM | POA: Diagnosis not present

## 2018-04-11 DIAGNOSIS — H353211 Exudative age-related macular degeneration, right eye, with active choroidal neovascularization: Secondary | ICD-10-CM | POA: Diagnosis not present

## 2018-04-11 DIAGNOSIS — H353221 Exudative age-related macular degeneration, left eye, with active choroidal neovascularization: Secondary | ICD-10-CM | POA: Diagnosis not present

## 2018-04-13 ENCOUNTER — Ambulatory Visit: Payer: Medicare Other | Admitting: Family Medicine

## 2018-04-17 NOTE — Progress Notes (Signed)
Cardiology Office Note:    Date:  04/19/2018   ID:  Joshua [name], DOB Jan 21, 1932, MRN [id number]  PCP:  Chipper Herb, MD  Cardiologist:  No primary care provider on file.   Referring MD: Chipper Herb, MD   Chief Complaint  Patient presents with  . Coronary Artery Disease  . Congestive Heart Failure    History of Present Illness:    Joshua [name] is a 84 y.o. male with a hx of szdisorder that followed intracerebral bleed from AV malformation,, prostate CA, bladder CAHTN, HLD, GERD, recent STEMI 05/10/2016 w/ DES toRCA complicated by Mobitz II-->HR improved and PPM not indicated.Subsequent pacemaker infection requiring device re-implantation 01/2018.  He is complaining of dyspnea on exertion.  No that pacemaker was explanted due to infection.  Seems to be doing okay from that standpoint.  Appetite is been stable.  Denies chills and fever.  Overall, still living independently.  Denies orthopnea.  No episodes of syncope.   Past Medical History:  Diagnosis Date  . 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 Arthritis   . Bilateral lower extremity edema   . Bladder tumor   . CAD (coronary artery disease) cardiologist--  dr Daneen Schick   a. 04/27/2016 PCI with DES to RCA with 50% ostial to 60% segmental mid to distal left main, and 90% thrombus filled ostial to proximal circumflex, with distal right coronary filled by collaterals from left-to-right  . COPD with emphysema ([place])   . Dyspnea on exertion   . ED (erectile dysfunction)   . First degree AV block   . GERD (gastroesophageal reflux disease)   . History of adenomatous polyp of colon    09-22-2008  . History of bladder cancer    s/p  resection bladder tumor 04-19-2016  non-invasive low-grade urothelial carcinoma  . History of prostate cancer urologist-  dr Jeffie Pollock-- last PSA 0.02 (summer 2017)   dx 02/ 2013-- Stage T2a, Gleason 7, PSA 1.58--  s/p  radioactive prostate  seed implants 01-19-2012  . History of ST elevation myocardial infarction (STEMI)    04-27-2016  . Hyperlipidemia   . Hypertension   . Presence of permanent cardiac pacemaker   . S/P drug eluting coronary stent placement    04-27-2016   . Second degree Mobitz I AV block    occasional w/ first degree heart block  per cardiologist note by dr Daneen Schick  . Seizures ([place]) per pt son -- no seizure's since 1980   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  arteriorvenous cerebral malformation s/p  craniotomy 1980  . Vitamin D deficiency     Past Surgical History:  Procedure Laterality Date  . APPENDECTOMY  2011  . BLEPHAROPLASTY Bilateral revision 02-17-2016  . CARDIAC CATHETERIZATION N/A 04/27/2016   Procedure: Left Heart Cath and Coronary Angiography;  Surgeon: Belva Crome, MD;  Location: [facility];  Service: Cardiovascular;  Laterality: N/A;  acute inferior wall STEMI ;  ostCx to midCx 90%,  ost1st Mrg to 1st Mrg 50%,  mid to distal RCA 100%,  pRCA 80%,  LVEF 45-50% w/ inferobasal akinesis  . CARDIAC CATHETERIZATION N/A 04/27/2016   Procedure: Temporary Pacemaker;  Surgeon: Belva Crome, MD;  Location: [facility];  Service: Cardiovascular;  Laterality: N/A;  mobitz 2  second degree HB w/ heart rate 29bpm  . CARDIAC CATHETERIZATION N/A 04/27/2016   Procedure: Coronary Stent Intervention;  Surgeon:  Belva Crome, MD;  Location: [facility];  Service: Cardiovascular;  Laterality: N/A; DES x1 to  Proximal RCA;  DES x1 to Mid RCA  . CATARACT EXTRACTION W/ INTRAOCULAR LENS  IMPLANT, BILATERAL  2016  approx.  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 CYSTOSCOPY  01/19/2012   Procedure: CYSTOSCOPY;  Surgeon: Malka So, MD;  Location: [facility];  Service: Urology;;  no seeds found in bladder  . CYSTOSCOPY W/ RETROGRADES Bilateral 04/19/2016   Procedure: CYSTOSCOPY WITH BILATERAL RETROGRADE PYELOGRAM  TRANSURETHRAL RESECTION OF BLADDER TUMOR ;  Surgeon: [name], MD;  Location: [facility];  Service: Urology;  Laterality: Bilateral;  . CYSTOSCOPY WITH BIOPSY N/A 11/10/2016   Procedure: CYSTOSCOPY WITH BIOPSY AND FULGURATION;  Surgeon: [name], MD;  Location: [facility];  Service: Urology;  Laterality: N/A;  . INSERT / REPLACE / REMOVE PACEMAKER  06/15/2017  . LEAD EXTRACTION N/A 01/22/2018   Procedure: LEAD EXTRACTION;  Surgeon: Evans Lance, MD;  Location: [facility];  Service: Cardiovascular;  Laterality: N/A;  . PACEMAKER IMPLANT N/A 06/15/2017   Procedure: Pacemaker Implant;  Surgeon: [name], MD;  Location: [facility];  Service: Cardiovascular;  Laterality: N/A;  . PACEMAKER IMPLANT N/A 01/25/2018   Procedure: PACEMAKER IMPLANT;  Surgeon: Evans Lance, MD;  Location: [facility];  Service: Cardiovascular;  Laterality: N/A;  . PPM GENERATOR REMOVAL N/A 01/22/2018   Procedure: PPM GENERATOR REMOVAL;  Surgeon: Evans Lance, MD;  Location: [facility];  Service: Cardiovascular;  Laterality: N/A;  . RADIOACTIVE SEED IMPLANT  01/19/2012   Procedure: RADIOACTIVE SEED IMPLANT;  Surgeon: Malka So, MD;  Location: [facility];  Service: Urology;  Laterality: N/A;  68 seeds implanted  . TRANSTHORACIC ECHOCARDIOGRAM  06/02/2016   ef 60-65%/  trivial MR/  mild TR    Current Medications: Current Meds  Medication Sig  . aspirin EC 81 MG tablet Take 1 tablet (81 mg total) by mouth daily.  Marland Kitchen atorvastatin (LIPITOR) 20 MG tablet Take 1 tablet (20 mg total) by mouth daily.  . calcium carbonate (OSCAL) 1500 (600 Ca) MG TABS tablet Take 600 mg of elemental calcium by mouth daily with breakfast.  . Cholecalciferol (VITAMIN D3) 1000 units CAPS Take 1 capsule by mouth daily.  . citalopram (CELEXA) 20 MG tablet Take 1 tablet (20 mg total) by mouth daily.  . clopidogrel (PLAVIX) 75 MG tablet Take 1 tablet (75 mg total) by mouth daily.  Marland Kitchen  MAGNESIUM PO Take 1 tablet by mouth at bedtime.  . Multiple Vitamins-Minerals (PRESERVISION AREDS 2) CAPS Take 1 capsule by mouth 2 (two) times daily.   Marland Kitchen PHENobarbital (LUMINAL) 64.8 MG tablet Take 1 tablet (64.8 mg total) by mouth 2 (two) times daily.  . phenytoin (DILANTIN) 100 MG ER capsule Take 2 capsules (200 mg total) by mouth 2 (two) times daily.  Marland Kitchen spironolactone (ALDACTONE) 25 MG tablet Take 25 mg by mouth daily.     Allergies:   Penicillins   Social History   Socioeconomic History  . Marital status: Widowed    Spouse name: Not on file  . Number of children: 2  . Years of education: Not on file  . Highest education level: Not on file  Occupational History  . Occupation: Retired  Scientific laboratory technician  . Financial resource strain: Not on file  . Food insecurity:  Worry: Not on file    Inability: Not on file  . Transportation needs:    Medical: Not on file    Non-medical: Not on file  Tobacco Use  . Smoking status: Former Smoker    Packs/day: 2.00    Years: 15.00    Pack years: 30.00    Last attempt to quit: 01/15/1973    Years since quitting: 45.2  . Smokeless tobacco: Never Used  Substance and Sexual Activity  . Alcohol use: Yes    Comment: occasionally  . Drug use: No  . Sexual activity: Not on file  Lifestyle  . Physical activity:    Days per week: Not on file    Minutes per session: Not on file  . Stress: Not on file  Relationships  . Social connections:    Talks on phone: Not on file    Gets together: Not on file    Attends religious service: Not on file    Active member of club or organization: Not on file    Attends meetings of clubs or organizations: Not on file    Relationship status: Not on file  Other Topics Concern  . Not on file  Social History Narrative   Joshua [name] to [facility] after MI for rehab.     Family History: The patient's family history includes Emphysema in his father and mother; Stroke in his mother.  ROS:   Please see the  history of present illness.    Low back discomfort, fatigue, difficulty sleeping.  All other systems reviewed and are negative.  EKGs/Labs/Other Studies Reviewed:    The following studies were reviewed today: Reviewed recent electrophysiology notes from Dr. Lovena Le and Dr. Rayann Heman.  EKG:  EKG is not ordered today.    Recent Labs: 12/12/2017: ALT 21 01/23/2018: Hemoglobin 12.1; Platelets 280 01/25/2018: BUN 16; Creatinine, Ser 1.00; Potassium 4.2; Sodium 140  Recent Lipid Panel    Component Value Date/Time   CHOL 154 12/12/2017 1229   TRIG 63 12/12/2017 1229   TRIG 85 01/20/2016 0958   HDL 70 12/12/2017 1229   HDL 69 01/20/2016 0958   CHOLHDL 2.2 12/12/2017 1229   CHOLHDL 3.3 04/27/2016 1133   VLDL 16 04/27/2016 1133   LDLCALC 71 12/12/2017 1229   LDLCALC 99 05/21/2014 1101    Physical Exam:    VS:  BP 140/64   Pulse 61   Ht 6\' 1"  (1.854 m)   Wt 185 lb 1.9 oz (84 kg)   BMI 24.42 kg/m     Wt Readings from Last 3 Encounters:  04/19/18 185 lb 1.9 oz (84 kg)  01/26/18 176 lb 5.9 oz (80 kg)  12/12/17 184 lb (83.5 kg)     GEN: Elderly and somewhat frail appearing  in no acute distress. HEENT: Normal NECK: No JVD. LYMPHATICS: No lymphadenopathy CARDIAC: RRR, 2/6 systolic murmur, no gallop, no edema. VASCULAR: Radial 2+ pulses.  No bruits. RESPIRATORY:  Clear to auscultation without rales, wheezing or rhonchi  ABDOMEN: Soft, non-tender, non-distended, No pulsatile mass, MUSCULOSKELETAL: No deformity  SKIN: Warm and dry NEUROLOGIC:  Alert and oriented x 3 PSYCHIATRIC:  Normal affect   ASSESSMENT:    1. SOB (shortness of breath)   2. Chronic diastolic heart failure ([place])   3. AV block, Mobitz 2   4. Pacemaker infection, subsequent encounter   5. Essential hypertension   6. Hypercholesterolemia    PLAN:    In order of problems listed above:  1. BMP, CMET, and CBC.  May need more diuretic therapy.   2. No obvious evidence of volume overload on exam.  Please see  dialogue above. 3. As pacemaker to control bradycardia associated with AV block.  Initial device was explanted due to infection area new device recently implanted. 4. Normal function when last seen in EP 5. Blood pressure is adequate for his age. 6. Did not discuss lipids.  Last LDL performed in March by Dr. Laurance Flatten was excellent at 71 mg/dL.  Action may be required if BNP is quite high.  Other laboratory data may help explain shortness of breath.  Encouraged aerobic activity.  Call if weight gain.  Clinical follow-up in 9 to 12 months otherwise.   Medication Adjustments/Labs and Tests Ordered: Current medicines are reviewed at length with the patient today.  Concerns regarding medicines are outlined above.  Orders Placed This Encounter  Procedures  . Pro b natriuretic peptide  . CBC  . Basic metabolic panel   No orders of the defined types were placed in this encounter.   Patient Instructions  Medication Instructions:  Your physician recommends that you continue on your current medications as directed. Please refer to the Current Medication list given to you today.  Labwork: BMET, CBC and Pro BNP today  Testing/Procedures: None  Follow-Up: Your physician wants you to follow-up in: 9 months with Dr. Tamala Julian.  You will receive a reminder letter in the mail two months in advance. If you don't receive a letter, please call our office to schedule the follow-up appointment.   Any Other Special Instructions Will Be Listed Below (If Applicable).     If you need a refill on your cardiac medications before your next appointment, please call your pharmacy.      Signed, Sinclair Grooms, MD  04/19/2018 6:01 PM    [facility]

## 2018-04-19 ENCOUNTER — Ambulatory Visit (INDEPENDENT_AMBULATORY_CARE_PROVIDER_SITE_OTHER): Payer: Medicare Other | Admitting: Interventional Cardiology

## 2018-04-19 ENCOUNTER — Encounter

## 2018-04-19 ENCOUNTER — Encounter: Payer: Self-pay | Admitting: Interventional Cardiology

## 2018-04-19 VITALS — BP 140/64 | HR 61 | Ht 73.0 in | Wt 185.1 lb

## 2018-04-19 DIAGNOSIS — I441 Atrioventricular block, second degree: Secondary | ICD-10-CM

## 2018-04-19 DIAGNOSIS — I5032 Chronic diastolic (congestive) heart failure: Secondary | ICD-10-CM | POA: Diagnosis not present

## 2018-04-19 DIAGNOSIS — R0602 Shortness of breath: Secondary | ICD-10-CM

## 2018-04-19 DIAGNOSIS — E7800 Pure hypercholesterolemia, unspecified: Secondary | ICD-10-CM

## 2018-04-19 DIAGNOSIS — T827XXD Infection and inflammatory reaction due to other cardiac and vascular devices, implants and grafts, subsequent encounter: Secondary | ICD-10-CM

## 2018-04-19 DIAGNOSIS — I2510 Atherosclerotic heart disease of native coronary artery without angina pectoris: Secondary | ICD-10-CM

## 2018-04-19 DIAGNOSIS — I10 Essential (primary) hypertension: Secondary | ICD-10-CM | POA: Diagnosis not present

## 2018-04-19 NOTE — Patient Instructions (Signed)
Medication Instructions:  Your physician recommends that you continue on your current medications as directed. Please refer to the Current Medication list given to you today.  Labwork: BMET, CBC and Pro BNP today  Testing/Procedures: None  Follow-Up: Your physician wants you to follow-up in: 9 months with Dr. Tamala Julian.  You will receive a reminder letter in the mail two months in advance. If you don't receive a letter, please call our office to schedule the follow-up appointment.   Any Other Special Instructions Will Be Listed Below (If Applicable).     If you need a refill on your cardiac medications before your next appointment, please call your pharmacy.

## 2018-04-20 LAB — BASIC METABOLIC PANEL
BUN/Creatinine Ratio: 16 (ref 10–24)
BUN: 14 mg/dL (ref 8–27)
CHLORIDE: 102 mmol/L (ref 96–106)
CO2: 24 mmol/L (ref 20–29)
CREATININE: 0.85 mg/dL (ref 0.76–1.27)
Calcium: 9 mg/dL (ref 8.6–10.2)
GFR calc Af Amer: 92 mL/min/{1.73_m2} (ref >59)
GFR calc non Af Amer: 80 mL/min/{1.73_m2} (ref >59)
GLUCOSE: 82 mg/dL (ref 65–99)
POTASSIUM: 4.2 mmol/L (ref 3.5–5.2)
SODIUM: 140 mmol/L (ref 134–144)

## 2018-04-20 LAB — CBC
HEMOGLOBIN: 11.7 g/dL — AB (ref 13.0–17.7)
Hematocrit: 34.3 % — ABNORMAL LOW (ref 37.5–51.0)
MCH: 30.6 pg (ref 26.6–33.0)
MCHC: 34.1 g/dL (ref 31.5–35.7)
MCV: 90 fL (ref 79–97)
PLATELETS: 273 10*3/uL (ref 150–450)
RBC: 3.82 x10E6/uL — ABNORMAL LOW (ref 4.14–5.80)
RDW: 15.4 % (ref 12.3–15.4)
WBC: 10.8 10*3/uL (ref 3.4–10.8)

## 2018-04-20 LAB — PRO B NATRIURETIC PEPTIDE: NT-Pro BNP: 216 pg/mL (ref 0–486)

## 2018-04-23 DIAGNOSIS — Z8551 Personal history of malignant neoplasm of bladder: Secondary | ICD-10-CM | POA: Diagnosis not present

## 2018-04-23 DIAGNOSIS — Z8546 Personal history of malignant neoplasm of prostate: Secondary | ICD-10-CM | POA: Diagnosis not present

## 2018-04-26 ENCOUNTER — Telehealth: Payer: Self-pay | Admitting: Interventional Cardiology

## 2018-04-26 DIAGNOSIS — H353211 Exudative age-related macular degeneration, right eye, with active choroidal neovascularization: Secondary | ICD-10-CM | POA: Diagnosis not present

## 2018-04-26 DIAGNOSIS — H353221 Exudative age-related macular degeneration, left eye, with active choroidal neovascularization: Secondary | ICD-10-CM | POA: Diagnosis not present

## 2018-04-26 NOTE — Telephone Encounter (Signed)
Pt aware of lab result; no additional questions.

## 2018-04-26 NOTE — Telephone Encounter (Signed)
New Message:       Pt is calling to get his lab results.

## 2018-05-07 ENCOUNTER — Encounter: Payer: Medicare Other | Admitting: Internal Medicine

## 2018-05-10 ENCOUNTER — Encounter: Payer: Self-pay | Admitting: Family Medicine

## 2018-05-10 ENCOUNTER — Ambulatory Visit (INDEPENDENT_AMBULATORY_CARE_PROVIDER_SITE_OTHER): Payer: Medicare Other | Admitting: Family Medicine

## 2018-05-10 VITALS — BP 122/55 | HR 60 | Temp 97.0°F | Ht 73.0 in | Wt 187.0 lb

## 2018-05-10 DIAGNOSIS — R569 Unspecified convulsions: Secondary | ICD-10-CM

## 2018-05-10 DIAGNOSIS — E7800 Pure hypercholesterolemia, unspecified: Secondary | ICD-10-CM

## 2018-05-10 DIAGNOSIS — R531 Weakness: Secondary | ICD-10-CM | POA: Diagnosis not present

## 2018-05-10 DIAGNOSIS — D692 Other nonthrombocytopenic purpura: Secondary | ICD-10-CM

## 2018-05-10 DIAGNOSIS — L570 Actinic keratosis: Secondary | ICD-10-CM | POA: Diagnosis not present

## 2018-05-10 DIAGNOSIS — C61 Malignant neoplasm of prostate: Secondary | ICD-10-CM

## 2018-05-10 DIAGNOSIS — L820 Inflamed seborrheic keratosis: Secondary | ICD-10-CM | POA: Diagnosis not present

## 2018-05-10 DIAGNOSIS — I2510 Atherosclerotic heart disease of native coronary artery without angina pectoris: Secondary | ICD-10-CM

## 2018-05-10 DIAGNOSIS — G40909 Epilepsy, unspecified, not intractable, without status epilepticus: Secondary | ICD-10-CM

## 2018-05-10 DIAGNOSIS — C679 Malignant neoplasm of bladder, unspecified: Secondary | ICD-10-CM

## 2018-05-10 DIAGNOSIS — K219 Gastro-esophageal reflux disease without esophagitis: Secondary | ICD-10-CM | POA: Diagnosis not present

## 2018-05-10 DIAGNOSIS — E559 Vitamin D deficiency, unspecified: Secondary | ICD-10-CM | POA: Diagnosis not present

## 2018-05-10 DIAGNOSIS — I10 Essential (primary) hypertension: Secondary | ICD-10-CM

## 2018-05-10 NOTE — Progress Notes (Signed)
Subjective:    Patient ID: Joshua [name], male    DOB: 1932-08-15, 84 y.o.   MRN: [id number]  HPI Pt here for follow up and management of chronic medical problems which includes hypertension and hyperlipidemia. He is taking medication regularly.  Patient is doing well overall.  He is followed regularly by the urologist for his prostate cancer and bladder cancer.  He is also followed regularly by the cardiologist for his heart disease.  His vital signs today are stable.  He has no specific complaints.  His GERD and arthritis and some COPD.  He has a remote seizure disorder but continues to take antiseizure medications.  He has some trouble with a pacemaker incision site that became infected and had to have this removed.  Recently had a BMP C met and CBC.  According to the cardiologist there was no evidence of any volume overload and he has had no problems with the new pacemaker device which was implanted.  It is a little down today.  He just got a phone call from the urologist saying they found something in the kidney and he needs to go back on Tuesday of this coming week to get a CT scan of the kidney.  He is not sure what this is and hoping that is not cancer of any kind.  Otherwise the bladder cancer and prostate cancer seem to be under good control with regular follow-ups.  He also has been seeing Dr. Tamala Julian regularly and follows up with him every 9 months for his heart.  Recent lab work as well as BNP were good.  Today he denies any chest pain and has had some increased shortness of breath but the cardiologist is aware of this and the BNP was good.  The pacemaker reimplant site has healed well.  He denies any trouble with nausea vomiting diarrhea blood in the stool or black tarry bowel movements and as mentioned he does see the urologist regularly.  He has had some problems with his eyes including macular degeneration and the ophthalmologist he has been seeing will not see anymore and he does not go see  another one until after the first of the year.  He would like for me to schedule him with [facility] health in [place].    Patient Active Problem List   Diagnosis Date Noted  . Pacemaker electrode infection ([place]) 01/22/2018  . Pacemaker infection ([place]) 01/22/2018  . Second degree AV block 06/15/2017  . Aortic atherosclerosis ([place]) 06/03/2016  . General weakness   . Protein-calorie malnutrition, severe ([place])   . GI bleed 06/01/2016  . Old MI (myocardial infarction) 04/27/2016  . Bladder cancer ([place]) 04/18/2016  . Vitamin D deficiency 10/15/2015  . Prostate cancer ([place])   . Hypercholesterolemia   . Essential hypertension   . Seizures ([place])   . GERD 08/22/2008  . COLONIC POLYPS, ADENOMATOUS, HX OF 08/22/2008   Outpatient Encounter Medications as of 05/10/2018  Medication Sig  . aspirin EC 81 MG tablet Take 1 tablet (81 mg total) by mouth daily.  Marland Kitchen atorvastatin (LIPITOR) 20 MG tablet Take 1 tablet (20 mg total) by mouth daily.  . calcium carbonate (OSCAL) 1500 (600 Ca) MG TABS tablet Take 600 mg of elemental calcium by mouth daily with breakfast.  . Cholecalciferol (VITAMIN D3) 1000 units CAPS Take 1 capsule by mouth daily.  . citalopram (CELEXA) 20 MG tablet Take 1 tablet (20 mg total) by mouth daily.  . clopidogrel (PLAVIX) 75  MG tablet Take 1 tablet (75 mg total) by mouth daily.  Marland Kitchen MAGNESIUM PO Take 1 tablet by mouth at bedtime.  . Multiple Vitamins-Minerals (PRESERVISION AREDS 2) CAPS Take 1 capsule by mouth 2 (two) times daily.   Marland Kitchen PHENobarbital (LUMINAL) 64.8 MG tablet Take 1 tablet (64.8 mg total) by mouth 2 (two) times daily.  . phenytoin (DILANTIN) 100 MG ER capsule Take 2 capsules (200 mg total) by mouth 2 (two) times daily.  Marland Kitchen spironolactone (ALDACTONE) 25 MG tablet Take 25 mg by mouth daily.   No facility-administered encounter medications on file as of 05/10/2018.       Review of Systems  Constitutional: Negative.   HENT: Negative.   Eyes:  Negative.   Respiratory: Negative.   Cardiovascular: Negative.   Gastrointestinal: Negative.   Endocrine: Negative.   Genitourinary: Negative.   Musculoskeletal: Negative.   Skin: Negative.   Allergic/Immunologic: Negative.   Neurological: Negative.   Hematological: Negative.   Psychiatric/Behavioral: Negative.        Objective:   Physical Exam  Constitutional: He is oriented to person, place, and time. He appears well-developed and well-nourished. No distress.  The patient is elderly appearing but alert and on top of all of his medical conditions.  He is worried about the CT scan that is planned for his abdomen and something with the kidney.  HENT:  Head: Normocephalic and atraumatic.  Right Ear: External ear normal.  Left Ear: External ear normal.  Nose: Nose normal.  Mouth/Throat: Oropharynx is clear and moist. No oropharyngeal exudate.  Eyes: Pupils are equal, round, and reactive to light. Conjunctivae and EOM are normal. Right eye exhibits no discharge. Left eye exhibits no discharge. No scleral icterus.  Neck: Normal range of motion. Neck supple. No thyromegaly present.  Cardiovascular: Normal rate, regular rhythm, normal heart sounds and intact distal pulses.  No murmur heard. The heart is regular at 60/min  Pulmonary/Chest: Effort normal and breath sounds normal. He has no wheezes. He has no rales.  Clear anteriorly and posteriorly  Abdominal: Soft. Bowel sounds are normal. He exhibits no mass. There is no tenderness. There is no guarding.  No abdominal tenderness masses or bruits  Genitourinary:  Genitourinary Comments: Follow-up with urology as planned  Musculoskeletal: He exhibits no edema, tenderness or deformity.  Mobility is somewhat unstable and he uses a walking stick when he comes in the office.  He is furniture walking at home.  He understands he needs to be careful.  Lymphadenopathy:    He has no cervical adenopathy.  Neurological: He is alert and oriented  to person, place, and time. He has normal reflexes. No cranial nerve deficit.  Skin: Skin is warm and dry. No rash noted.  Psychiatric: He has a normal mood and affect. His behavior is normal. Judgment and thought content normal.  Normal mood affect and behavior for this patient.  Nursing note and vitals reviewed.   BP (!) 122/55 (BP Location: Left Arm)   Pulse 60   Temp (!) 97 F (36.1 C) (Oral)   Ht 6' 1"  (1.854 m)   Wt 187 lb (84.8 kg)   BMI 24.67 kg/m        Assessment & Plan:  1. Hypercholesterolemia -Continue with as aggressive therapeutic lifestyle changes as possible to keep cholesterol under control - Lipid panel; Future  2. Essential hypertension -Blood pressure is good today and he will continue with current treatment - Hepatic function panel; Future  3. ASCVD (arteriosclerotic cardiovascular disease) -Follow-up  with cardiology as planned  4. Vitamin D deficiency -Continue vitamin D pending results of lab work  5. Gastroesophageal reflux disease, esophagitis presence not specified -Continue to eat healthy and avoid fried greasy and highly spicy foods  6. Seizure disorder ([place]) -No recent seizure history and patient will continue with Dilantin and phenobarbital currently doing and we will get levels on him at the next blood draw. - Phenytoin level, free and total; Future - Phenobarbital level; Future  7. Prostate cancer [facility]) -Follow-up with urology as planned  8. Seizures ([facility]) -Dilantin and phenobarbital levels pending  9. Senile purpura ([place]) -Patient continues to have senile purpura on the arms  10. Malignant neoplasm of urinary bladder, unspecified site [facility]) -Follow-up with urology as planned  11. General weakness -Recent lab work done by the cardiologist was all good.  No change in treatment based on this.  He will get a lipid liver panel and get his seizure medication levels checked at the blood draw.  Patient Instructions                        Medicare Annual Wellness Visit  [facility] and the medical providers at [facility] strive to bring you the best medical care.  In doing so we not only want to address your current medical conditions and concerns but also to detect new conditions early and prevent illness, disease and health-related problems.    Medicare offers a yearly Wellness Visit which allows our clinical staff to assess your need for preventative services including immunizations, lifestyle education, counseling to decrease risk of preventable diseases and screening for fall risk and other medical concerns.    This visit is provided free of charge (no copay) for all Medicare recipients. The clinical pharmacists at [facility] have begun to conduct these Wellness Visits which will also include a thorough review of all your medications.    As you primary medical provider recommend that you make an appointment for your Annual Wellness Visit if you have not done so already this year.  You may set up this appointment before you leave today or you may call back ([phone]) and schedule an appointment.  Please make sure when you call that you mention that you are scheduling your Annual Wellness Visit with the clinical pharmacist so that the appointment may be made for the proper length of time.     Continue current medications. Continue good therapeutic lifestyle changes which include good diet and exercise. Fall precautions discussed with patient. If an FOBT was given today- please return it to our front desk. If you are over 63 years old - you may need Prevnar 25 or the adult Pneumonia vaccine.  **Flu shots are available--- please call and schedule a FLU-CLINIC appointment**  After your visit with [place] today you will receive a survey in the mail or online from [employer] regarding your care with [place]. Please take a moment to fill this out. Your feedback is very important to [place] as you  can help [place] better understand your patient needs as well as improve your experience and satisfaction. WE CARE ABOUT YOU!!!   Follow-up with Dr. Jeffie Pollock as planned Follow-up with Dr. Daneen Schick as planned Schedule visit with [facility] eye physicians per the card that I gave you during the visit today in [place].  Continue with physical therapy to keep your strength and mobility and stability Drink plenty of water and stay  well-hydrated especially this summer Use Mucinex maximum strength, plain, 1 twice daily for congestion  Arrie Senate MD

## 2018-05-10 NOTE — Patient Instructions (Addendum)
Medicare Annual Wellness Visit  [facility] and the medical providers at [facility] strive to bring you the best medical care.  In doing so we not only want to address your current medical conditions and concerns but also to detect new conditions early and prevent illness, disease and health-related problems.    Medicare offers a yearly Wellness Visit which allows our clinical staff to assess your need for preventative services including immunizations, lifestyle education, counseling to decrease risk of preventable diseases and screening for fall risk and other medical concerns.    This visit is provided free of charge (no copay) for all Medicare recipients. The clinical pharmacists at [facility] have begun to conduct these Wellness Visits which will also include a thorough review of all your medications.    As you primary medical provider recommend that you make an appointment for your Annual Wellness Visit if you have not done so already this year.  You may set up this appointment before you leave today or you may call back [phone]) and schedule an appointment.  Please make sure when you call that you mention that you are scheduling your Annual Wellness Visit with the clinical pharmacist so that the appointment may be made for the proper length of time.     Continue current medications. Continue good therapeutic lifestyle changes which include good diet and exercise. Fall precautions discussed with patient. If an FOBT was given today- please return it to our front desk. If you are over 71 years old - you may need Prevnar 69 or the adult Pneumonia vaccine.  **Flu shots are available--- please call and schedule a FLU-CLINIC appointment**  After your visit with [place] today you will receive a survey in the mail or online from [employer] regarding your care with [place]. Please take a moment to fill this out. Your feedback is very  important to [place] as you can help [place] better understand your patient needs as well as improve your experience and satisfaction. WE CARE ABOUT YOU!!!   Follow-up with Dr. Jeffie Pollock as planned Follow-up with Dr. Daneen Schick as planned Schedule visit with [facility] eye physicians per the card that I gave you during the visit today in [place].  Continue with physical therapy to keep your strength and mobility and stability Drink plenty of water and stay well-hydrated especially this summer Use Mucinex maximum strength, plain, 1 twice daily for congestion

## 2018-05-15 DIAGNOSIS — K8020 Calculus of gallbladder without cholecystitis without obstruction: Secondary | ICD-10-CM | POA: Diagnosis not present

## 2018-05-15 DIAGNOSIS — C672 Malignant neoplasm of lateral wall of bladder: Secondary | ICD-10-CM | POA: Diagnosis not present

## 2018-05-23 DIAGNOSIS — H353211 Exudative age-related macular degeneration, right eye, with active choroidal neovascularization: Secondary | ICD-10-CM | POA: Diagnosis not present

## 2018-05-23 DIAGNOSIS — H353221 Exudative age-related macular degeneration, left eye, with active choroidal neovascularization: Secondary | ICD-10-CM | POA: Diagnosis not present

## 2018-06-07 ENCOUNTER — Other Ambulatory Visit: Payer: Self-pay | Admitting: *Deleted

## 2018-06-07 DIAGNOSIS — H353221 Exudative age-related macular degeneration, left eye, with active choroidal neovascularization: Secondary | ICD-10-CM | POA: Diagnosis not present

## 2018-06-07 DIAGNOSIS — H9193 Unspecified hearing loss, bilateral: Secondary | ICD-10-CM

## 2018-06-07 DIAGNOSIS — H353211 Exudative age-related macular degeneration, right eye, with active choroidal neovascularization: Secondary | ICD-10-CM | POA: Diagnosis not present

## 2018-06-08 ENCOUNTER — Ambulatory Visit (INDEPENDENT_AMBULATORY_CARE_PROVIDER_SITE_OTHER): Payer: Medicare Other | Admitting: Internal Medicine

## 2018-06-08 ENCOUNTER — Encounter: Payer: Self-pay | Admitting: Internal Medicine

## 2018-06-08 VITALS — BP 144/70 | HR 60 | Ht 72.0 in | Wt 185.0 lb

## 2018-06-08 DIAGNOSIS — T827XXD Infection and inflammatory reaction due to other cardiac and vascular devices, implants and grafts, subsequent encounter: Secondary | ICD-10-CM | POA: Diagnosis not present

## 2018-06-08 DIAGNOSIS — Z950 Presence of cardiac pacemaker: Secondary | ICD-10-CM

## 2018-06-08 DIAGNOSIS — I2510 Atherosclerotic heart disease of native coronary artery without angina pectoris: Secondary | ICD-10-CM

## 2018-06-08 DIAGNOSIS — I441 Atrioventricular block, second degree: Secondary | ICD-10-CM

## 2018-06-08 NOTE — Patient Instructions (Signed)
Medication Instructions:  Your physician recommends that you continue on your current medications as directed. Please refer to the Current Medication list given to you today.  Labwork: None ordered.  Testing/Procedures: None ordered.  Follow-Up: Your physician wants you to follow-up in: 9 months with Dr. Knox Saliva will receive a reminder letter in the mail two months in advance. If you don't receive a letter, please call our office to schedule the follow-up appointment.  Remote monitoring is used to monitor your Pacemaker from home. This monitoring reduces the number of office visits required to check your device to one time per year. It allows [place] to keep an eye on the functioning of your device to ensure it is working properly. You are scheduled for a device check from home on 09/07/2018. You may send your transmission at any time that day. If you have a wireless device, the transmission will be sent automatically. After your physician reviews your transmission, you will receive a postcard with your next transmission date.  Any Other Special Instructions Will Be Listed Below (If Applicable).  If you need a refill on your cardiac medications before your next appointment, please call your pharmacy.

## 2018-06-08 NOTE — Progress Notes (Signed)
HPI Joshua [name] returns today for followup. He is a pleasant 84 yo man with a h/o heart block and htn, s/p PPM insertion. He has done well since his PPM was inserted 66months ago. No chest pain or sob. No syncope. No edema. His has CAD and bladder CA.  Allergies  Allergen Reactions  . Penicillins Rash    Has patient had a PCN reaction causing immediate rash, facial/tongue/throat swelling, SOB or lightheadedness with hypotension: ##Yes## Has patient had a PCN reaction causing severe rash involving mucus membranes or skin necrosis: No Has patient had a PCN reaction that required hospitalization No Has patient had a PCN reaction occurring within the last 10 years: No If all of the above answers are "NO", then may proceed with Cephalosporin use.      Current Outpatient Medications  Medication Sig Dispense Refill  . aspirin EC 81 MG tablet Take 1 tablet (81 mg total) by mouth daily. 90 tablet 3  . atorvastatin (LIPITOR) 20 MG tablet Take 1 tablet (20 mg total) by mouth daily. 90 tablet 3  . calcium carbonate (OSCAL) 1500 (600 Ca) MG TABS tablet Take 600 mg of elemental calcium by mouth daily with breakfast.    . Cholecalciferol (VITAMIN D3) 1000 units CAPS Take 1 capsule by mouth daily.    . citalopram (CELEXA) 20 MG tablet Take 1 tablet (20 mg total) by mouth daily. 90 tablet 3  . clopidogrel (PLAVIX) 75 MG tablet Take 1 tablet (75 mg total) by mouth daily. 90 tablet 3  . MAGNESIUM PO Take 1 tablet by mouth at bedtime.    . Multiple Vitamins-Minerals (PRESERVISION AREDS 2) CAPS Take 1 capsule by mouth 2 (two) times daily.     Marland Kitchen PHENobarbital (LUMINAL) 64.8 MG tablet Take 1 tablet (64.8 mg total) by mouth 2 (two) times daily. 180 tablet 3  . phenytoin (DILANTIN) 100 MG ER capsule Take 2 capsules (200 mg total) by mouth 2 (two) times daily. 360 capsule 3  . spironolactone (ALDACTONE) 25 MG tablet Take 25 mg by mouth daily.     No current facility-administered medications for this visit.       Past Medical History:  Diagnosis Date  . 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 Arthritis   . Bilateral lower extremity edema   . Bladder tumor   . CAD (coronary artery disease) cardiologist--  dr Daneen Schick   a. 04/27/2016 PCI with DES to RCA with 50% ostial to 60% segmental mid to distal left main, and 90% thrombus filled ostial to proximal circumflex, with distal right coronary filled by collaterals from left-to-right  . COPD with emphysema ([place])   . Dyspnea on exertion   . ED (erectile dysfunction)   . First degree AV block   . GERD (gastroesophageal reflux disease)   . History of adenomatous polyp of colon    09-22-2008  . History of bladder cancer    s/p  resection bladder tumor 04-19-2016  non-invasive low-grade urothelial carcinoma  . History of prostate cancer urologist-  dr Jeffie Pollock-- last PSA 0.02 (summer 2017)   dx 02/ 2013-- Stage T2a, Gleason 7, PSA 1.58--  s/p  radioactive prostate seed implants 01-19-2012  . History of ST elevation myocardial infarction (STEMI)    04-27-2016  . Hyperlipidemia   . Hypertension   . Presence of permanent cardiac pacemaker   . S/P drug eluting coronary stent placement    04-27-2016   .  Second degree Mobitz I AV block    occasional w/ first degree heart block  per cardiologist note by dr Daneen Schick  . Seizures ([place]) per pt son -- no seizure's since 1980   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  arteriorvenous cerebral malformation s/p  craniotomy 1980  . Vitamin D deficiency     ROS:   All systems reviewed and negative except as noted in the HPI.   Past Surgical History:  Procedure Laterality Date  . APPENDECTOMY  2011  . BLEPHAROPLASTY Bilateral revision 02-17-2016  . CARDIAC CATHETERIZATION N/A 04/27/2016   Procedure: Left Heart Cath and Coronary Angiography;  Surgeon: Belva Crome, MD;  Location: [facility];  Service: Cardiovascular;  Laterality:  N/A;  acute inferior wall STEMI ;  ostCx to midCx 90%,  ost1st Mrg to 1st Mrg 50%,  mid to distal RCA 100%,  pRCA 80%,  LVEF 45-50% w/ inferobasal akinesis  . CARDIAC CATHETERIZATION N/A 04/27/2016   Procedure: Temporary Pacemaker;  Surgeon: Belva Crome, MD;  Location: [facility];  Service: Cardiovascular;  Laterality: N/A;  mobitz 2  second degree HB w/ heart rate 29bpm  . CARDIAC CATHETERIZATION N/A 04/27/2016   Procedure: Coronary Stent Intervention;  Surgeon: Belva Crome, MD;  Location: [facility];  Service: Cardiovascular;  Laterality: N/A; DES x1 to  Proximal RCA;  DES x1 to Mid RCA  . CATARACT EXTRACTION W/ INTRAOCULAR LENS  IMPLANT, BILATERAL  2016  approx.  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 CYSTOSCOPY  01/19/2012   Procedure: CYSTOSCOPY;  Surgeon: Malka So, MD;  Location: [facility];  Service: Urology;;  no seeds found in bladder  . CYSTOSCOPY W/ RETROGRADES Bilateral 04/19/2016   Procedure: CYSTOSCOPY WITH BILATERAL RETROGRADE PYELOGRAM TRANSURETHRAL RESECTION OF BLADDER TUMOR ;  Surgeon: [name], MD;  Location: [facility];  Service: Urology;  Laterality: Bilateral;  . CYSTOSCOPY WITH BIOPSY N/A 11/10/2016   Procedure: CYSTOSCOPY WITH BIOPSY AND FULGURATION;  Surgeon: [name], MD;  Location: [facility];  Service: Urology;  Laterality: N/A;  . INSERT / REPLACE / REMOVE PACEMAKER  06/15/2017  . LEAD EXTRACTION N/A 01/22/2018   Procedure: LEAD EXTRACTION;  Surgeon: Evans Lance, MD;  Location: [facility];  Service: Cardiovascular;  Laterality: N/A;  . PACEMAKER IMPLANT N/A 06/15/2017   Procedure: Pacemaker Implant;  Surgeon: [name], MD;  Location: [facility];  Service: Cardiovascular;  Laterality: N/A;  . PACEMAKER IMPLANT N/A 01/25/2018   Procedure: PACEMAKER IMPLANT;  Surgeon: Evans Lance, MD;  Location: [facility];  Service: Cardiovascular;   Laterality: N/A;  . PPM GENERATOR REMOVAL N/A 01/22/2018   Procedure: PPM GENERATOR REMOVAL;  Surgeon: Evans Lance, MD;  Location: [facility];  Service: Cardiovascular;  Laterality: N/A;  . RADIOACTIVE SEED IMPLANT  01/19/2012   Procedure: RADIOACTIVE SEED IMPLANT;  Surgeon: Malka So, MD;  Location: [facility];  Service: Urology;  Laterality: N/A;  68 seeds implanted  . TRANSTHORACIC ECHOCARDIOGRAM  06/02/2016   ef 60-65%/  trivial MR/  mild TR     Family History  Problem Relation Age of Onset  . Emphysema Mother        age 15 deceased  . Stroke Mother   . Emphysema Father        deceased age 82     Social  History   Socioeconomic History  . Marital status: Widowed    Spouse name: Not on file  . Number of children: 2  . Years of education: Not on file  . Highest education level: Not on file  Occupational History  . Occupation: Retired  Scientific laboratory technician  . Financial resource strain: Not on file  . Food insecurity:    Worry: Not on file    Inability: Not on file  . Transportation needs:    Medical: Not on file    Non-medical: Not on file  Tobacco Use  . Smoking status: Former Smoker    Packs/day: 2.00    Years: 15.00    Pack years: 30.00    Last attempt to quit: 01/15/1973    Years since quitting: 45.4  . Smokeless tobacco: Never Used  Substance and Sexual Activity  . Alcohol use: Yes    Comment: occasionally  . Drug use: No  . Sexual activity: Not on file  Lifestyle  . Physical activity:    Days per week: Not on file    Minutes per session: Not on file  . Stress: Not on file  Relationships  . Social connections:    Talks on phone: Not on file    Gets together: Not on file    Attends religious service: Not on file    Active member of club or organization: Not on file    Attends meetings of clubs or organizations: Not on file    Relationship status: Not on file  . Intimate partner violence:    Fear of current or ex partner: Not on  file    Emotionally abused: Not on file    Physically abused: Not on file    Forced sexual activity: Not on file  Other Topics Concern  . Not on file  Social History Narrative   Martin Majestic to [facility] after MI for rehab.     BP (!) 144/70   Pulse 60   Ht 6' (1.829 m)   Wt 185 lb (83.9 kg)   SpO2 98%   BMI 25.09 kg/m   Physical Exam:  Well appearing NAD HEENT: Unremarkable Neck:  No JVD, no thyromegally Lymphatics:  No adenopathy Back:  No CVA tenderness Lungs:  Clear with no wheezes HEART:  Regular rate rhythm, no murmurs, no rubs, no clicks Abd:  soft, positive bowel sounds, no organomegally, no rebound, no guarding Ext:  2 plus pulses, no edema, no cyanosis, no clubbing Skin:  No rashes no nodules Neuro:  CN II through XII intact, motor grossly intact  EKG - nsr with ventricular pacing  DEVICE  Normal device function.  See PaceArt for details.   Assess/Plan: 1. CHB - he is asymptomatic, s/p PPM insertion. 2. PPM - his medtronic DDD PM is working normally. We will recheck in several months. 3. HTN - his blood pressure is a little high today. We will follow.   Joshua [name].D.

## 2018-07-04 DIAGNOSIS — H353211 Exudative age-related macular degeneration, right eye, with active choroidal neovascularization: Secondary | ICD-10-CM | POA: Diagnosis not present

## 2018-07-04 DIAGNOSIS — H353221 Exudative age-related macular degeneration, left eye, with active choroidal neovascularization: Secondary | ICD-10-CM | POA: Diagnosis not present

## 2018-07-17 ENCOUNTER — Telehealth: Payer: Self-pay

## 2018-07-17 NOTE — Telephone Encounter (Signed)
Called patient to complete annual [employer] survey.  

## 2018-07-17 NOTE — Research (Signed)
BLUE SYNC Informed Consent   Subject Name: Joshua [name]  Subject met inclusion and exclusion criteria.  The informed consent form, study requirements and expectations were reviewed with the subject and questions and concerns were addressed prior to the signing of the consent form.  The subject verbalized understanding of the trail requirements.  The subject agreed to participate in the [facility] trial and signed the informed consent.  The informed consent was obtained prior to performance of any protocol-specific procedures for the subject.  A copy of the signed informed consent was given to the subject and a copy was placed in the subject's medical record.  [name] 07/17/2018, 12:00 PM

## 2018-07-19 DIAGNOSIS — H353211 Exudative age-related macular degeneration, right eye, with active choroidal neovascularization: Secondary | ICD-10-CM | POA: Diagnosis not present

## 2018-07-19 DIAGNOSIS — H353221 Exudative age-related macular degeneration, left eye, with active choroidal neovascularization: Secondary | ICD-10-CM | POA: Diagnosis not present

## 2018-07-26 DIAGNOSIS — H903 Sensorineural hearing loss, bilateral: Secondary | ICD-10-CM | POA: Diagnosis not present

## 2018-08-21 DIAGNOSIS — D225 Melanocytic nevi of trunk: Secondary | ICD-10-CM | POA: Diagnosis not present

## 2018-08-21 DIAGNOSIS — L821 Other seborrheic keratosis: Secondary | ICD-10-CM | POA: Diagnosis not present

## 2018-08-21 DIAGNOSIS — Z85828 Personal history of other malignant neoplasm of skin: Secondary | ICD-10-CM | POA: Diagnosis not present

## 2018-08-21 DIAGNOSIS — L820 Inflamed seborrheic keratosis: Secondary | ICD-10-CM | POA: Diagnosis not present

## 2018-08-21 DIAGNOSIS — D485 Neoplasm of uncertain behavior of skin: Secondary | ICD-10-CM | POA: Diagnosis not present

## 2018-08-21 DIAGNOSIS — C4441 Basal cell carcinoma of skin of scalp and neck: Secondary | ICD-10-CM | POA: Diagnosis not present

## 2018-08-23 DIAGNOSIS — H353221 Exudative age-related macular degeneration, left eye, with active choroidal neovascularization: Secondary | ICD-10-CM | POA: Diagnosis not present

## 2018-08-23 DIAGNOSIS — H353211 Exudative age-related macular degeneration, right eye, with active choroidal neovascularization: Secondary | ICD-10-CM | POA: Diagnosis not present

## 2018-08-27 ENCOUNTER — Other Ambulatory Visit: Payer: Self-pay | Admitting: Family Medicine

## 2018-08-28 NOTE — Telephone Encounter (Signed)
Rx sent Please call pt to come have his labwork done that was ordered at his last OV

## 2018-08-28 NOTE — Telephone Encounter (Signed)
Pt aware and will come by to have labs drawn and get his flu shot.

## 2018-08-29 ENCOUNTER — Other Ambulatory Visit (INDEPENDENT_AMBULATORY_CARE_PROVIDER_SITE_OTHER): Payer: Medicare Other

## 2018-08-29 DIAGNOSIS — G40909 Epilepsy, unspecified, not intractable, without status epilepticus: Secondary | ICD-10-CM

## 2018-08-29 DIAGNOSIS — Z23 Encounter for immunization: Secondary | ICD-10-CM | POA: Diagnosis not present

## 2018-08-29 DIAGNOSIS — I2510 Atherosclerotic heart disease of native coronary artery without angina pectoris: Secondary | ICD-10-CM

## 2018-08-29 DIAGNOSIS — I10 Essential (primary) hypertension: Secondary | ICD-10-CM

## 2018-08-29 DIAGNOSIS — K219 Gastro-esophageal reflux disease without esophagitis: Secondary | ICD-10-CM

## 2018-08-29 DIAGNOSIS — E559 Vitamin D deficiency, unspecified: Secondary | ICD-10-CM

## 2018-08-29 DIAGNOSIS — E7800 Pure hypercholesterolemia, unspecified: Secondary | ICD-10-CM

## 2018-08-29 MED ORDER — ATORVASTATIN CALCIUM 20 MG PO TABS: 20.0000 mg | ORAL_TABLET | Freq: Every day | ORAL | 0 refills | Status: DC

## 2018-08-29 MED ORDER — SPIRONOLACTONE 25 MG PO TABS: 25.0000 mg | ORAL_TABLET | Freq: Every day | ORAL | 3 refills | Status: DC

## 2018-08-29 MED ORDER — PHENYTOIN SODIUM EXTENDED 100 MG PO CAPS: 200.0000 mg | ORAL_CAPSULE | Freq: Two times a day (BID) | ORAL | 3 refills | Status: DC

## 2018-08-29 MED ORDER — CLOPIDOGREL BISULFATE 75 MG PO TABS: 75.0000 mg | ORAL_TABLET | Freq: Every day | ORAL | 3 refills | Status: DC

## 2018-08-29 MED ORDER — CITALOPRAM HYDROBROMIDE 20 MG PO TABS: 20.0000 mg | ORAL_TABLET | Freq: Every day | ORAL | 3 refills | Status: DC

## 2018-08-30 DIAGNOSIS — H353211 Exudative age-related macular degeneration, right eye, with active choroidal neovascularization: Secondary | ICD-10-CM | POA: Diagnosis not present

## 2018-08-30 DIAGNOSIS — H353221 Exudative age-related macular degeneration, left eye, with active choroidal neovascularization: Secondary | ICD-10-CM | POA: Diagnosis not present

## 2018-08-30 LAB — BMP8+EGFR
BUN/Creatinine Ratio: 22 (ref 10–24)
BUN: 20 mg/dL (ref 8–27)
CO2: 22 mmol/L (ref 20–29)
Calcium: 9.7 mg/dL (ref 8.6–10.2)
Chloride: 105 mmol/L (ref 96–106)
Creatinine, Ser: 0.91 mg/dL (ref 0.76–1.27)
GFR calc Af Amer: 89 mL/min/{1.73_m2} (ref >59)
GFR calc non Af Amer: 77 mL/min/{1.73_m2} (ref >59)
Glucose: 84 mg/dL (ref 65–99)
Potassium: 4.8 mmol/L (ref 3.5–5.2)
Sodium: 142 mmol/L (ref 134–144)

## 2018-08-30 LAB — LIPID PANEL
Chol/HDL Ratio: 2.2 ratio (ref 0.0–5.0)
Cholesterol, Total: 153 mg/dL (ref 100–199)
HDL: 71 mg/dL (ref >39)
LDL Calculated: 71 mg/dL (ref 0–99)
Triglycerides: 56 mg/dL (ref 0–149)
VLDL CHOLESTEROL CAL: 11 mg/dL (ref 5–40)

## 2018-08-30 LAB — CBC WITH DIFFERENTIAL/PLATELET
Basophils Absolute: 0.2 10*3/uL (ref 0.0–0.2)
Basos: 2 %
EOS (ABSOLUTE): 1.1 10*3/uL — ABNORMAL HIGH (ref 0.0–0.4)
Eos: 10 %
Hematocrit: 34.7 % — ABNORMAL LOW (ref 37.5–51.0)
Hemoglobin: 12.3 g/dL — ABNORMAL LOW (ref 13.0–17.7)
IMMATURE GRANULOCYTES: 0 %
Immature Grans (Abs): 0 10*3/uL (ref 0.0–0.1)
Lymphocytes Absolute: 3.2 10*3/uL — ABNORMAL HIGH (ref 0.7–3.1)
Lymphs: 31 %
MCH: 30.3 pg (ref 26.6–33.0)
MCHC: 35.4 g/dL (ref 31.5–35.7)
MCV: 86 fL (ref 79–97)
Monocytes Absolute: 1.1 10*3/uL — ABNORMAL HIGH (ref 0.1–0.9)
Monocytes: 11 %
Neutrophils Absolute: 4.9 10*3/uL (ref 1.4–7.0)
Neutrophils: 46 %
Platelets: 326 10*3/uL (ref 150–450)
RBC: 4.06 x10E6/uL — ABNORMAL LOW (ref 4.14–5.80)
RDW: 13.8 % (ref 12.3–15.4)
WBC: 10.6 10*3/uL (ref 3.4–10.8)

## 2018-08-30 LAB — HEPATIC FUNCTION PANEL
ALT: 18 IU/L (ref 0–44)
AST: 27 IU/L (ref 0–40)
Albumin: 4.2 g/dL (ref 3.5–4.7)
Alkaline Phosphatase: 144 IU/L — ABNORMAL HIGH (ref 39–117)
Bilirubin Total: 0.5 mg/dL (ref 0.0–1.2)
Bilirubin, Direct: 0.15 mg/dL (ref 0.00–0.40)
Total Protein: 6.8 g/dL (ref 6.0–8.5)

## 2018-08-30 LAB — VITAMIN D 25 HYDROXY (VIT D DEFICIENCY, FRACTURES): Vit D, 25-Hydroxy: 26.6 ng/mL — ABNORMAL LOW (ref 30.0–100.0)

## 2018-08-30 LAB — PHENOBARBITAL LEVEL: Phenobarbital, Serum: 24 ug/mL (ref 15–40)

## 2018-08-30 LAB — PHENYTOIN LEVEL, FREE: Phenytoin, Free: 1 ug/mL (ref 1.0–2.0)

## 2018-09-07 ENCOUNTER — Ambulatory Visit (INDEPENDENT_AMBULATORY_CARE_PROVIDER_SITE_OTHER): Payer: Medicare Other

## 2018-09-07 ENCOUNTER — Encounter: Payer: Self-pay | Admitting: *Deleted

## 2018-09-07 DIAGNOSIS — I441 Atrioventricular block, second degree: Secondary | ICD-10-CM

## 2018-09-07 NOTE — Progress Notes (Signed)
Remote pacemaker transmission.   

## 2018-09-20 DIAGNOSIS — Z961 Presence of intraocular lens: Secondary | ICD-10-CM | POA: Diagnosis not present

## 2018-09-20 DIAGNOSIS — H04123 Dry eye syndrome of bilateral lacrimal glands: Secondary | ICD-10-CM | POA: Diagnosis not present

## 2018-09-20 DIAGNOSIS — H353290 Exudative age-related macular degeneration, unspecified eye, stage unspecified: Secondary | ICD-10-CM | POA: Insufficient documentation

## 2018-09-20 DIAGNOSIS — H353231 Exudative age-related macular degeneration, bilateral, with active choroidal neovascularization: Secondary | ICD-10-CM | POA: Diagnosis not present

## 2018-10-02 ENCOUNTER — Encounter: Payer: Self-pay | Admitting: Family Medicine

## 2018-10-02 ENCOUNTER — Ambulatory Visit (INDEPENDENT_AMBULATORY_CARE_PROVIDER_SITE_OTHER): Payer: Medicare Other | Admitting: Family Medicine

## 2018-10-02 VITALS — BP 129/63 | HR 84 | Temp 97.2°F | Ht 72.0 in | Wt 189.0 lb

## 2018-10-02 DIAGNOSIS — C672 Malignant neoplasm of lateral wall of bladder: Secondary | ICD-10-CM | POA: Diagnosis not present

## 2018-10-02 DIAGNOSIS — R195 Other fecal abnormalities: Secondary | ICD-10-CM

## 2018-10-02 DIAGNOSIS — R198 Other specified symptoms and signs involving the digestive system and abdomen: Secondary | ICD-10-CM

## 2018-10-02 DIAGNOSIS — R569 Unspecified convulsions: Secondary | ICD-10-CM | POA: Diagnosis not present

## 2018-10-02 DIAGNOSIS — E7800 Pure hypercholesterolemia, unspecified: Secondary | ICD-10-CM | POA: Diagnosis not present

## 2018-10-02 DIAGNOSIS — D692 Other nonthrombocytopenic purpura: Secondary | ICD-10-CM | POA: Diagnosis not present

## 2018-10-02 DIAGNOSIS — G40909 Epilepsy, unspecified, not intractable, without status epilepticus: Secondary | ICD-10-CM | POA: Diagnosis not present

## 2018-10-02 DIAGNOSIS — K219 Gastro-esophageal reflux disease without esophagitis: Secondary | ICD-10-CM

## 2018-10-02 DIAGNOSIS — E559 Vitamin D deficiency, unspecified: Secondary | ICD-10-CM | POA: Diagnosis not present

## 2018-10-02 DIAGNOSIS — I2510 Atherosclerotic heart disease of native coronary artery without angina pectoris: Secondary | ICD-10-CM | POA: Diagnosis not present

## 2018-10-02 DIAGNOSIS — I10 Essential (primary) hypertension: Secondary | ICD-10-CM

## 2018-10-02 DIAGNOSIS — C61 Malignant neoplasm of prostate: Secondary | ICD-10-CM | POA: Diagnosis not present

## 2018-10-02 LAB — CBC WITH DIFFERENTIAL/PLATELET
Basophils Absolute: 0.2 10*3/uL (ref 0.0–0.2)
Basos: 1 %
EOS (ABSOLUTE): 0.6 10*3/uL — ABNORMAL HIGH (ref 0.0–0.4)
Eos: 4 %
Hematocrit: 32.7 % — ABNORMAL LOW (ref 37.5–51.0)
Hemoglobin: 11.1 g/dL — ABNORMAL LOW (ref 13.0–17.7)
IMMATURE GRANULOCYTES: 1 %
Immature Grans (Abs): 0.1 10*3/uL (ref 0.0–0.1)
LYMPHS: 22 %
Lymphocytes Absolute: 2.9 10*3/uL (ref 0.7–3.1)
MCH: 30.9 pg (ref 26.6–33.0)
MCHC: 33.9 g/dL (ref 31.5–35.7)
MCV: 91 fL (ref 79–97)
MONOS ABS: 1.1 10*3/uL — AB (ref 0.1–0.9)
Monocytes: 8 %
Neutrophils Absolute: 8.1 10*3/uL — ABNORMAL HIGH (ref 1.4–7.0)
Neutrophils: 64 %
Platelets: 341 10*3/uL (ref 150–450)
RBC: 3.59 x10E6/uL — ABNORMAL LOW (ref 4.14–5.80)
RDW: 14.6 % (ref 11.6–15.4)
WBC: 12.9 10*3/uL — ABNORMAL HIGH (ref 3.4–10.8)

## 2018-10-02 NOTE — Progress Notes (Signed)
Subjective:    Patient ID: Joshua [name], male    DOB: Dec 21, 1931, 85 y.o.   MRN: [id number]  HPI Pt here for follow up and management of chronic medical problems which includes hypertension and hyperlipidemia. He is taking medication regularly.  The patient today is concerned that he could have colon cancer.  He says his stools are dark and his bowel movements are irregular.  This is different from the past.  He is given an FOBT to return.  He did have a colonoscopy in January 2010.  He has had lab work done and we will review that with him during the visit today.  He is followed regularly by cardiology including visits to Dr. Lovena Le Dr. Tamala Julian and Dr. Rayann Heman.  He did have a pacemaker infection which had to be replaced and that is doing well.  He is also being followed by the urologist because of prostate cancer.  The recent lab work done in December on December 11 had cholesterol numbers that were all good with an LDL-C being 71 and could be a little bit better.  The good cholesterol was excellent triglycerides are good at 56.  The blood sugar at that time was good at 84 and the creatinine and all of the electrolytes were normal.  The CBC has a white blood cell count that is normal.  The hemoglobin over the past 2 years or a year and a half have been stable at about 12.3 and as low as 11.7.  The platelet count is adequate.  The vitamin D level is low and we need to make sure that he is taking his vitamin D3 regularly.  All liver function test are within normal limits except the alkaline phosphatase is slightly elevated but lower than it has been in the past.  This particular elevation goes back to September 2017.  The Dilantin level was within normal limits most recently.  The phenobarbital level was also within normal limits.  The patient's vital signs are stable and his weight is up about 4 pounds since the last visit.  Blood pressure is good heart rate is good at 84.  He is taking atorvastatin a baby  aspirin Plavix and Aldactone.  He is also still taking Celexa for his depression.  The patient has a history of arthritis COPD hyperlipidemia hypertension and remote seizure disorder from a cerebral hemorrhage many years ago.  He sees a cardiologist regularly.  The patient today is doing well overall.  He is concerned that he has had irregular bowel movements for 3 to 4 months.  Sometimes they are loose and sometimes there is solid.  Recently he had a black tarry bowel movement.  His hemoglobin has been stable.  He is taking clopidogrel and aspirin and currently taking them on an empty stomach.  He has been taking some Tums for some occasional indigestion.  He had a colonoscopy 10 years ago this month by Dr. Marga Hoots.  There is no family history of colon cancer.  Today he denies any chest pain pressure or tightness anymore than usual other than having some increased shortness of breath secondary to his lack of physical activity.  He has plans to restart rehab physical therapy when the flu season is over and says that he knows that it makes a difference with his strength.  He is passing his water without problems and does see the urologist about every 6 months because of bladder cancer and prostate cancer.  With his GI  complaints in addition to the dark bowel movement and he said it was tarry in color.  He does have some bloating.  He is going to watch his milk and dairy products more closely.   Patient Active Problem List   Diagnosis Date Noted  . Pacemaker electrode infection ([place]) 01/22/2018  . Pacemaker infection ([place]) 01/22/2018  . Second degree AV block 06/15/2017  . Aortic atherosclerosis ([place]) 06/03/2016  . General weakness   . Protein-calorie malnutrition, severe ([place])   . GI bleed 06/01/2016  . Old MI (myocardial infarction) 04/27/2016  . Bladder cancer ([place]) 04/18/2016  . Vitamin D deficiency 10/15/2015  . Prostate cancer ([place])   . Hypercholesterolemia   . Essential hypertension   .  Seizures ([place])   . GERD 08/22/2008  . COLONIC POLYPS, ADENOMATOUS, HX OF 08/22/2008   Outpatient Encounter Medications as of 10/02/2018  Medication Sig  . aspirin EC 81 MG tablet Take 1 tablet (81 mg total) by mouth daily.  Marland Kitchen atorvastatin (LIPITOR) 20 MG tablet Take 1 tablet (20 mg total) by mouth daily.  . calcium carbonate (OSCAL) 1500 (600 Ca) MG TABS tablet Take 600 mg of elemental calcium by mouth daily with breakfast.  . Cholecalciferol (VITAMIN D3) 1000 units CAPS Take 1 capsule by mouth daily.  . citalopram (CELEXA) 20 MG tablet Take 1 tablet (20 mg total) by mouth daily.  . clopidogrel (PLAVIX) 75 MG tablet Take 1 tablet (75 mg total) by mouth daily.  Marland Kitchen MAGNESIUM PO Take 1 tablet by mouth at bedtime.  . Multiple Vitamins-Minerals (PRESERVISION AREDS 2) CAPS Take 1 capsule by mouth 2 (two) times daily.   Marland Kitchen PHENobarbital (LUMINAL) 64.8 MG tablet Take 1 tablet (64.8 mg total) by mouth 2 (two) times daily.  . phenytoin (DILANTIN) 100 MG ER capsule Take 2 capsules (200 mg total) by mouth 2 (two) times daily.  Marland Kitchen spironolactone (ALDACTONE) 25 MG tablet Take 1 tablet (25 mg total) by mouth daily.   No facility-administered encounter medications on file as of 10/02/2018.       Review of Systems  Constitutional: Negative.   HENT: Negative.   Eyes: Negative.   Respiratory: Negative.   Cardiovascular: Negative.   Gastrointestinal: Negative.        Dark and irregular stools   Endocrine: Negative.   Genitourinary: Negative.   Musculoskeletal: Negative.   Skin: Negative.   Allergic/Immunologic: Negative.   Neurological: Negative.   Hematological: Negative.   Psychiatric/Behavioral: Negative.        Objective:   Physical Exam Vitals signs and nursing note reviewed.  Constitutional:      Appearance: Normal appearance. He is well-developed and normal weight.     Comments: The patient is alert and pleasant.  He is worried about his bowel habits and his changes and especially the  black stool that he had.  HENT:     Head: Normocephalic and atraumatic.     Right Ear: Tympanic membrane, ear canal and external ear normal. There is no impacted cerumen.     Left Ear: Tympanic membrane, ear canal and external ear normal. There is no impacted cerumen.     Nose: Nose normal. No congestion.     Mouth/Throat:     Mouth: Mucous membranes are moist.     Pharynx: Oropharynx is clear. No oropharyngeal exudate.  Eyes:     General: No scleral icterus.       Right eye: No discharge.        Left eye: No discharge.  Extraocular Movements: Extraocular movements intact.     Conjunctiva/sclera: Conjunctivae normal.     Pupils: Pupils are equal, round, and reactive to light.     Comments: Follow-up with ophthalmology as planned  Neck:     Musculoskeletal: Normal range of motion and neck supple. No muscular tenderness.     Thyroid: No thyromegaly.     Vascular: No carotid bruit.     Trachea: No tracheal deviation.     Comments: No bruits thyromegaly or anterior cervical adenopathy Cardiovascular:     Rate and Rhythm: Normal rate and regular rhythm.     Pulses: Normal pulses.     Heart sounds: Normal heart sounds. No murmur. No friction rub. No gallop.      Comments: Heart is regular at 72/min with good pedal pulses and no edema Pulmonary:     Effort: Pulmonary effort is normal. No respiratory distress.     Breath sounds: No wheezing or rales.     Comments: Scar tissue right posterior base.  No axillary adenopathy with good breath sounds otherwise anteriorly and posteriorly Chest:     Chest wall: No tenderness.  Abdominal:     General: Abdomen is flat. Bowel sounds are normal.     Palpations: Abdomen is soft. There is no mass.     Tenderness: There is no abdominal tenderness. There is no guarding.     Comments: No masses tenderness organ enlargement bruits or inguinal adenopathy  Genitourinary:    Comments: Patient sees Dr. Jeffie Pollock, his urologist about every 6  months Musculoskeletal: Normal range of motion.        General: No tenderness.     Right lower leg: No edema.     Left lower leg: No edema.  Lymphadenopathy:     Cervical: No cervical adenopathy.  Skin:    General: Skin is warm and dry.     Findings: No bruising or rash.  Neurological:     General: No focal deficit present.     Mental Status: He is alert and oriented to person, place, and time. Mental status is at baseline.     Cranial Nerves: No cranial nerve deficit.     Deep Tendon Reflexes: Reflexes are normal and symmetric.  Psychiatric:        Mood and Affect: Mood normal.        Behavior: Behavior normal.        Thought Content: Thought content normal.        Judgment: Judgment normal.     Comments: Patient's mood affect and behavior are normal for him.    BP 129/63 (BP Location: Left Arm)   Pulse 84   Temp (!) 97.2 F (36.2 C) (Oral)   Ht 6' (1.829 m)   Wt 189 lb (85.7 kg)   BMI 25.63 kg/m         Assessment & Plan:  1. Hypercholesterolemia -Continue with cholesterol treatment and as aggressive therapeutic lifestyle changes as possible.  Resume rehab physical therapy as soon as the flu season begins to wane  2. Essential hypertension -Blood pressure is good today and he will continue with current treatment  3. ASCVD (arteriosclerotic cardiovascular disease) -Continue to follow-up with Dr. Tamala Julian and cardiologist  4. Vitamin D deficiency -Increase vitamin D3 by taking 2 on Saturday and Sunday and 1 Monday through Friday  5. Gastroesophageal reflux disease, esophagitis presence not specified -Take Pepcid AC before breakfast and supper and make sure that patient takes Plavix and aspirin after a  meal what ever time of the day that he takes it  6. Seizure disorder [employer]) -The patient has a remote history of seizure disorder secondary to I believe a ruptured aneurysm many many years ago.  He is remained on phenobarbital and Dilantin and his most recent levels were  good.  7. Prostate cancer ([place]) -Continue to follow-up with urology - Ambulatory referral to Gastroenterology  8. Irregular bowel habits -Repeat CBC and have patient return in FOBT today - Ambulatory referral to Gastroenterology - CBC with Differential/Platelet  9. Dark stools -Repeat CBC and return FOBT - Ambulatory referral to Gastroenterology - CBC with Differential/Platelet  10. Seizures ([place]) -No seizures in many years.  11. Senile purpura ([place]) -No increased bleeding or bruising noted today.  12. Malignant neoplasm of lateral wall of urinary bladder ([place]) -Follow-up with urology as planned  Patient Instructions                       Medicare Annual Wellness Visit  [facility] and the medical providers at [facility] strive to bring you the best medical care.  In doing so we not only want to address your current medical conditions and concerns but also to detect new conditions early and prevent illness, disease and health-related problems.    Medicare offers a yearly Wellness Visit which allows our clinical staff to assess your need for preventative services including immunizations, lifestyle education, counseling to decrease risk of preventable diseases and screening for fall risk and other medical concerns.    This visit is provided free of charge (no copay) for all Medicare recipients. The clinical pharmacists at [facility] have begun to conduct these Wellness Visits which will also include a thorough review of all your medications.    As you primary medical provider recommend that you make an appointment for your Annual Wellness Visit if you have not done so already this year.  You may set up this appointment before you leave today or you may call back ([phone]) and schedule an appointment.  Please make sure when you call that you mention that you are scheduling your Annual Wellness Visit with the clinical pharmacist so that  the appointment may be made for the proper length of time.     Continue current medications. Continue good therapeutic lifestyle changes which include good diet and exercise. Fall precautions discussed with patient. If an FOBT was given today- please return it to our front desk. If you are over 70 years old - you may need Prevnar 70 or the adult Pneumonia vaccine.  **Flu shots are available--- please call and schedule a FLU-CLINIC appointment**  After your visit with [place] today you will receive a survey in the mail or online from [employer] regarding your care with [place]. Please take a moment to fill this out. Your feedback is very important to [place] as you can help [place] better understand your patient needs as well as improve your experience and satisfaction. WE CARE ABOUT YOU!!!   Please take clopidogrel and aspirin after eating instead of before eating Take a Pepcid AC 1 twice daily before breakfast and supper Return the FOBT card We will repeat the CBC today for stability purposes Consider restarting rehab physical therapy once the flu season seems to be resolving We will arrange for you to see the gastroenterologist because of your concern for the black tarry bowel movement and irregular bowel movements. Hopefully the additional lab work that we  are doing will be back by the time you see him.  We will make sure that he gets a copy of this. Continue to follow-up with urology Continue to follow-up with cardiology Follow-up with ophthalmology as planned Continue to be careful do not put yourself at risk for falling and move slowly  Arrie Senate MD

## 2018-10-02 NOTE — Progress Notes (Signed)
Electrophysiology Office Note Date: 10/03/2018  ID:  Joshua [name], DOB 1931-11-03, MRN [id number]  PCP: Chipper Herb, MD Electrophysiologist: Lovena Le  CC: Pacemaker follow-up  Joshua [name] is a 85 y.o. male seen today for Dr Lovena Le.  He presents today for routine electrophysiology followup.  Since last being seen in our clinic, the patient reports doing very well.  He denies chest pain, palpitations, dyspnea, PND, orthopnea, nausea, vomiting, dizziness, syncope, edema, weight gain, or early satiety.  Device History: MDT dual chamber PPM implanted 2018 for mobitz II; extraction and re-implantation 2019   Past Medical History:  Diagnosis Date  . 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 Arthritis   . Bilateral lower extremity edema   . Bladder tumor   . CAD (coronary artery disease) cardiologist--  dr Daneen Schick   a. 04/27/2016 PCI with DES to RCA with 50% ostial to 60% segmental mid to distal left main, and 90% thrombus filled ostial to proximal circumflex, with distal right coronary filled by collaterals from left-to-right  . COPD with emphysema ([place])   . Dyspnea on exertion   . ED (erectile dysfunction)   . First degree AV block   . GERD (gastroesophageal reflux disease)   . History of adenomatous polyp of colon    09-22-2008  . History of bladder cancer    s/p  resection bladder tumor 04-19-2016  non-invasive low-grade urothelial carcinoma  . History of prostate cancer urologist-  dr Jeffie Pollock-- last PSA 0.02 (summer 2017)   dx 02/ 2013-- Stage T2a, Gleason 7, PSA 1.58--  s/p  radioactive prostate seed implants 01-19-2012  . History of ST elevation myocardial infarction (STEMI)    04-27-2016  . Hyperlipidemia   . Hypertension   . Presence of permanent cardiac pacemaker   . S/P drug eluting coronary stent placement    04-27-2016   . Second degree Mobitz I AV block    occasional w/ first degree heart block  per  cardiologist note by dr Daneen Schick  . Seizures ([place]) per pt son -- no seizure's since 1980   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  arteriorvenous cerebral malformation s/p  craniotomy 1980  . Vitamin D deficiency    Past Surgical History:  Procedure Laterality Date  . APPENDECTOMY  2011  . BLEPHAROPLASTY Bilateral revision 02-17-2016  . CARDIAC CATHETERIZATION N/A 04/27/2016   Procedure: Left Heart Cath and Coronary Angiography;  Surgeon: Belva Crome, MD;  Location: [facility];  Service: Cardiovascular;  Laterality: N/A;  acute inferior wall STEMI ;  ostCx to midCx 90%,  ost1st Mrg to 1st Mrg 50%,  mid to distal RCA 100%,  pRCA 80%,  LVEF 45-50% w/ inferobasal akinesis  . CARDIAC CATHETERIZATION N/A 04/27/2016   Procedure: Temporary Pacemaker;  Surgeon: Belva Crome, MD;  Location: [facility];  Service: Cardiovascular;  Laterality: N/A;  mobitz 2  second degree HB w/ heart rate 29bpm  . CARDIAC CATHETERIZATION N/A 04/27/2016   Procedure: Coronary Stent Intervention;  Surgeon: Belva Crome, MD;  Location: [facility];  Service: Cardiovascular;  Laterality: N/A; DES x1 to  Proximal RCA;  DES x1 to Mid RCA  . CATARACT EXTRACTION W/ INTRAOCULAR LENS  IMPLANT, BILATERAL  2016  approx.  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 CYSTOSCOPY  01/19/2012   Procedure: CYSTOSCOPY;  Surgeon: Marshall Cork  Jeffie Pollock, MD;  Location[facility];  Service: Urology;;  no seeds found in bladder  . CYSTOSCOPY W/ RETROGRADES Bilateral 04/19/2016   Procedure: CYSTOSCOPY WITH BILATERAL RETROGRADE PYELOGRAM TRANSURETHRAL RESECTION OF BLADDER TUMOR ;  Surgeon: [name], MD;  Location: [facility];  Service: Urology;  Laterality: Bilateral;  . CYSTOSCOPY WITH BIOPSY N/A 11/10/2016   Procedure: CYSTOSCOPY WITH BIOPSY AND FULGURATION;  Surgeon: [name], MD;  Location[facility];  Service: Urology;  Laterality: N/A;  .  INSERT / REPLACE / REMOVE PACEMAKER  06/15/2017  . LEAD EXTRACTION N/A 01/22/2018   Procedure: LEAD EXTRACTION;  Surgeon: Evans Lance, MD;  Location: [facility];  Service: Cardiovascular;  Laterality: N/A;  . PACEMAKER IMPLANT N/A 06/15/2017   Procedure: Pacemaker Implant;  Surgeon: [name], MD;  Location: [facility];  Service: Cardiovascular;  Laterality: N/A;  . PACEMAKER IMPLANT N/A 01/25/2018   Procedure: PACEMAKER IMPLANT;  Surgeon: Evans Lance, MD;  Location: [facility];  Service: Cardiovascular;  Laterality: N/A;  . PPM GENERATOR REMOVAL N/A 01/22/2018   Procedure: PPM GENERATOR REMOVAL;  Surgeon: Evans Lance, MD;  Location: [facility];  Service: Cardiovascular;  Laterality: N/A;  . RADIOACTIVE SEED IMPLANT  01/19/2012   Procedure: RADIOACTIVE SEED IMPLANT;  Surgeon: Malka So, MD;  [facility];  Service: Urology;  Laterality: N/A;  68 seeds implanted  . TRANSTHORACIC ECHOCARDIOGRAM  06/02/2016   ef 60-65%/  trivial MR/  mild TR    Current Outpatient Medications  Medication Sig Dispense Refill  . aspirin EC 81 MG tablet Take 1 tablet (81 mg total) by mouth daily. 90 tablet 3  . atorvastatin (LIPITOR) 20 MG tablet Take 1 tablet (20 mg total) by mouth daily. 90 tablet 0  . calcium carbonate (OSCAL) 1500 (600 Ca) MG TABS tablet Take 600 mg of elemental calcium by mouth daily with breakfast.    . Cholecalciferol (VITAMIN D3) 1000 units CAPS Take 1 capsule by mouth daily.    . citalopram (CELEXA) 20 MG tablet Take 1 tablet (20 mg total) by mouth daily. 90 tablet 3  . clopidogrel (PLAVIX) 75 MG tablet Take 1 tablet (75 mg total) by mouth daily. 90 tablet 3  . MAGNESIUM PO Take 1 tablet by mouth at bedtime.    . Multiple Vitamins-Minerals (PRESERVISION AREDS 2) CAPS Take 1 capsule by mouth 2 (two) times daily.     Marland Kitchen PHENobarbital (LUMINAL) 64.8 MG tablet Take 1 tablet (64.8 mg total) by mouth 2 (two) times daily. 180 tablet  3  . phenytoin (DILANTIN) 100 MG ER capsule Take 2 capsules (200 mg total) by mouth 2 (two) times daily. 360 capsule 3  . spironolactone (ALDACTONE) 25 MG tablet Take 1 tablet (25 mg total) by mouth daily. 90 tablet 3   No current facility-administered medications for this visit.     Allergies:   Penicillins   Social History: Social History   Socioeconomic History  . Marital status: Widowed    Spouse name: Not on file  . Number of children: 2  . Years of education: Not on file  . Highest education level: Not on file  Occupational History  . Occupation: Retired  Scientific laboratory technician  . Financial resource strain: Not on file  . Food insecurity:    Worry: Not on file    Inability: Not on file  . Transportation needs:    Medical: Not on file  Non-medical: Not on file  Tobacco Use  . Smoking status: Former Smoker    Packs/day: 2.00    Years: 15.00    Pack years: 30.00    Last attempt to quit: 01/15/1973    Years since quitting: 45.7  . Smokeless tobacco: Never Used  Substance and Sexual Activity  . Alcohol use: Yes    Comment: occasionally  . Drug use: No  . Sexual activity: Not on file  Lifestyle  . Physical activity:    Days per week: Not on file    Minutes per session: Not on file  . Stress: Not on file  Relationships  . Social connections:    Talks on phone: Not on file    Gets together: Not on file    Attends religious service: Not on file    Active member of club or organization: Not on file    Attends meetings of clubs or organizations: Not on file    Relationship status: Not on file  . Intimate partner violence:    Fear of current or ex partner: Not on file    Emotionally abused: Not on file    Physically abused: Not on file    Forced sexual activity: Not on file  Other Topics Concern  . Not on file  Social History Narrative   [facility] to [facility] after MI for rehab.    Family History: Family History  Problem Relation Age of Onset  . Emphysema  Mother        age 45 deceased  . Stroke Mother   . Emphysema Father        deceased age 41     Review of Systems: All other systems reviewed and are otherwise negative except as noted above.   Physical Exam: VS:  BP 124/72   Pulse 72   Ht 6' (1.829 m)   Wt 189 lb (85.7 kg)   SpO2 97%   BMI 25.63 kg/m  , BMI Body mass index is 25.63 kg/m.  GEN- The patient is elderly appearing, alert and oriented x 3 today.   HEENT: normocephalic, atraumatic; sclera clear, conjunctiva pink; hearing intact; oropharynx clear; neck supple  Lungs- Clear to ausculation bilaterally, normal work of breathing.  No wheezes, rales, rhonchi Heart- Regular rate and rhythm  GI- soft, non-tender, non-distended, bowel sounds present  Extremities- no clubbing, cyanosis, or edema  MS- no significant deformity or atrophy Skin- warm and dry, no rash or lesion; PPM pocket well healed Psych- euthymic mood, full affect Neuro- strength and sensation are intact  PPM Interrogation- reviewed in detail today,  See PACEART report  EKG:  EKG is not ordered today.  Recent Labs: 04/19/2018: NT-Pro BNP 216 08/29/2018: ALT 18; BUN 20; Creatinine, Ser 0.91; Potassium 4.8; Sodium 142 10/02/2018: Hemoglobin 11.1; Platelets 341   Wt Readings from Last 3 Encounters:  10/03/18 189 lb (85.7 kg)  10/02/18 189 lb (85.7 kg)  06/08/18 185 lb (83.9 kg)     Other studies Reviewed: Additional studies/ records that were reviewed today include: Dr Tanna Furry office notes   Assessment and Plan:  1.  Complete heart block Normal PPM function See Pace Art report No changes today  2.  HTN Stable No change required today   Current medicines are reviewed at length with the patient today.   The patient does not have concerns regarding his medicines.  The following changes were made today:  none  Labs/ tests ordered today include: none Orders Placed This Encounter  Procedures  . CUP PACEART INCLINIC DEVICE CHECK      Disposition:   Follow up with [facility], Dr Lovena Le 1 year     Signed, Chanetta Marshall, NP 10/03/2018 11:41 AM  [address] [place] [id number] [phone] (office) [phone] (fax)

## 2018-10-02 NOTE — Patient Instructions (Addendum)
Medicare Annual Wellness Visit  [facility] and the medical providers at [facility] strive to bring you the best medical care.  In doing so we not only want to address your current medical conditions and concerns but also to detect new conditions early and prevent illness, disease and health-related problems.    Medicare offers a yearly Wellness Visit which allows our clinical staff to assess your need for preventative services including immunizations, lifestyle education, counseling to decrease risk of preventable diseases and screening for fall risk and other medical concerns.    This visit is provided free of charge (no copay) for all Medicare recipients. The clinical pharmacists at [facility] have begun to conduct these Wellness Visits which will also include a thorough review of all your medications.    As you primary medical provider recommend that you make an appointment for your Annual Wellness Visit if you have not done so already this year.  You may set up this appointment before you leave today or you may call back ([phone]) and schedule an appointment.  Please make sure when you call that you mention that you are scheduling your Annual Wellness Visit with the clinical pharmacist so that the appointment may be made for the proper length of time.     Continue current medications. Continue good therapeutic lifestyle changes which include good diet and exercise. Fall precautions discussed with patient. If an FOBT was given today- please return it to our front desk. If you are over 85 years old - you may need Prevnar 34 or the adult Pneumonia vaccine.  **Flu shots are available--- please call and schedule a FLU-CLINIC appointment**  After your visit with [place] today you will receive a survey in the mail or online from [employer] regarding your care with [place]. Please take a moment to fill this out. Your feedback is very  important to [place] as you can help [place] better understand your patient needs as well as improve your experience and satisfaction. WE CARE ABOUT YOU!!!   Please take clopidogrel and aspirin after eating instead of before eating Take a Pepcid AC 1 twice daily before breakfast and supper Return the FOBT card We will repeat the CBC today for stability purposes Consider restarting rehab physical therapy once the flu season seems to be resolving We will arrange for you to see the gastroenterologist because of your concern for the black tarry bowel movement and irregular bowel movements. Hopefully the additional lab work that we are doing will be back by the time you see him.  We will make sure that he gets a copy of this. Continue to follow-up with urology Continue to follow-up with cardiology Follow-up with ophthalmology as planned Continue to be careful do not put yourself at risk for falling and move slowly

## 2018-10-03 ENCOUNTER — Ambulatory Visit (INDEPENDENT_AMBULATORY_CARE_PROVIDER_SITE_OTHER): Payer: Medicare Other | Admitting: Nurse Practitioner

## 2018-10-03 ENCOUNTER — Encounter: Payer: Self-pay | Admitting: Nurse Practitioner

## 2018-10-03 VITALS — BP 124/72 | HR 72 | Ht 72.0 in | Wt 189.0 lb

## 2018-10-03 DIAGNOSIS — I2510 Atherosclerotic heart disease of native coronary artery without angina pectoris: Secondary | ICD-10-CM

## 2018-10-03 DIAGNOSIS — I441 Atrioventricular block, second degree: Secondary | ICD-10-CM

## 2018-10-03 DIAGNOSIS — I10 Essential (primary) hypertension: Secondary | ICD-10-CM

## 2018-10-03 LAB — CUP PACEART INCLINIC DEVICE CHECK
Date Time Interrogation Session: 20200115110223
Implantable Lead Implant Date: 20190509
Implantable Lead Implant Date: 20190509
Implantable Lead Location: 753859
Implantable Lead Location: 753860
Implantable Lead Model: 5076
Implantable Pulse Generator Implant Date: 20190509

## 2018-10-03 NOTE — Patient Instructions (Signed)
Medication Instructions:  none If you need a refill on your cardiac medications before your next appointment, please call your pharmacy.   Lab work: none If you have labs (blood work) drawn today and your tests are completely normal, you will receive your results only by: Marland Kitchen MyChart Message (if you have MyChart) OR . A paper copy in the mail If you have any lab test that is abnormal or we need to change your treatment, we will call you to review the results.  Testing/Procedures: none  Follow-Up: At [facility] you and your health needs are our priority.  As part of our continuing mission to provide you with exceptional heart care, we have created designated Provider Care Teams.  These Care Teams include your primary Cardiologist (physician) and Advanced Practice Providers (APPs -  Physician Assistants and Nurse Practitioners) who all work together to provide you with the care you need, when you need it. You will need a follow up appointment in 1 years.  Please call our office 2 months in advance to schedule this appointment.  You may see Dr Lovena Le or one of the following Advanced Practice Providers on your designated Care Team:   Chanetta Marshall, NP . Tommye Standard, PA-C  Any Other Special Instructions Will Be Listed Below (If Applicable). Remote monitoring is used to monitor your Pacemaker  from home. This monitoring reduces the number of office visits required to check your device to one time per year. It allows [place] to keep an eye on the functioning of your device to ensure it is working properly. You are scheduled for a device check from home on 12/07/2018. You may send your transmission at any time that day. If you have a wireless device, the transmission will be sent automatically. After your physician reviews your transmission, you will receive a postcard with your next transmission date.

## 2018-10-04 ENCOUNTER — Other Ambulatory Visit: Payer: Medicare Other

## 2018-10-04 DIAGNOSIS — H353231 Exudative age-related macular degeneration, bilateral, with active choroidal neovascularization: Secondary | ICD-10-CM | POA: Diagnosis not present

## 2018-10-04 DIAGNOSIS — Z125 Encounter for screening for malignant neoplasm of prostate: Secondary | ICD-10-CM

## 2018-10-05 DIAGNOSIS — H353231 Exudative age-related macular degeneration, bilateral, with active choroidal neovascularization: Secondary | ICD-10-CM | POA: Diagnosis not present

## 2018-10-05 DIAGNOSIS — H04123 Dry eye syndrome of bilateral lacrimal glands: Secondary | ICD-10-CM | POA: Diagnosis not present

## 2018-10-05 DIAGNOSIS — Z961 Presence of intraocular lens: Secondary | ICD-10-CM | POA: Diagnosis not present

## 2018-10-05 LAB — FECAL OCCULT BLOOD, IMMUNOCHEMICAL: Fecal Occult Bld: POSITIVE — AB

## 2018-10-07 LAB — CUP PACEART REMOTE DEVICE CHECK
Battery Remaining Longevity: 124 mo
Battery Voltage: 3.06 V
Brady Statistic AP VP Percent: 42.77 %
Brady Statistic AP VS Percent: 0.03 %
Brady Statistic AS VS Percent: 0.61 %
Brady Statistic RA Percent Paced: 42.8 %
Brady Statistic RV Percent Paced: 99.37 %
Date Time Interrogation Session: 20191218191019
Implantable Lead Implant Date: 20190509
Implantable Lead Implant Date: 20190509
Implantable Lead Location: 753859
Implantable Lead Location: 753860
Implantable Lead Model: 3830
Implantable Pulse Generator Implant Date: 20190509
Lead Channel Impedance Value: 304 Ohm
Lead Channel Impedance Value: 456 Ohm
Lead Channel Impedance Value: 589 Ohm
Lead Channel Pacing Threshold Amplitude: 1 V
Lead Channel Pacing Threshold Amplitude: 1.25 V
Lead Channel Pacing Threshold Pulse Width: 0.4 ms
Lead Channel Sensing Intrinsic Amplitude: 1 mV
Lead Channel Sensing Intrinsic Amplitude: 1 mV
Lead Channel Sensing Intrinsic Amplitude: 3.375 mV
Lead Channel Sensing Intrinsic Amplitude: 3.375 mV
Lead Channel Setting Pacing Amplitude: 2.5 V
Lead Channel Setting Pacing Amplitude: 2.5 V
Lead Channel Setting Pacing Pulse Width: 0.4 ms
Lead Channel Setting Sensing Sensitivity: 0.9 mV
MDC IDC MSMT LEADCHNL RA IMPEDANCE VALUE: 380 Ohm
MDC IDC MSMT LEADCHNL RV PACING THRESHOLD PULSEWIDTH: 0.4 ms
MDC IDC STAT BRADY AS VP PERCENT: 56.6 %

## 2018-10-08 ENCOUNTER — Other Ambulatory Visit: Payer: Medicare Other

## 2018-10-08 ENCOUNTER — Other Ambulatory Visit: Payer: Self-pay | Admitting: *Deleted

## 2018-10-08 DIAGNOSIS — Z1211 Encounter for screening for malignant neoplasm of colon: Secondary | ICD-10-CM

## 2018-10-08 MED ORDER — PHENOBARBITAL 64.8 MG PO TABS: 64.8000 mg | ORAL_TABLET | Freq: Two times a day (BID) | ORAL | 1 refills | Status: DC

## 2018-10-09 LAB — FECAL OCCULT BLOOD, IMMUNOCHEMICAL: FECAL OCCULT BLD: NEGATIVE

## 2018-10-10 ENCOUNTER — Other Ambulatory Visit: Payer: Self-pay | Admitting: *Deleted

## 2018-10-10 ENCOUNTER — Other Ambulatory Visit: Payer: Self-pay

## 2018-10-10 NOTE — Progress Notes (Unsigned)
Per Dr. Tamala Julian med list updated to show ASA has been d/c'd due to low HGB and pt will be following up with GI.

## 2018-10-11 ENCOUNTER — Ambulatory Visit (INDEPENDENT_AMBULATORY_CARE_PROVIDER_SITE_OTHER): Payer: Medicare Other | Admitting: Physician Assistant

## 2018-10-11 ENCOUNTER — Telehealth: Payer: Self-pay | Admitting: *Deleted

## 2018-10-11 ENCOUNTER — Other Ambulatory Visit (INDEPENDENT_AMBULATORY_CARE_PROVIDER_SITE_OTHER): Payer: Medicare Other

## 2018-10-11 ENCOUNTER — Encounter: Payer: Self-pay | Admitting: Physician Assistant

## 2018-10-11 VITALS — BP 118/62 | HR 70 | Ht 71.0 in | Wt 188.5 lb

## 2018-10-11 DIAGNOSIS — I2510 Atherosclerotic heart disease of native coronary artery without angina pectoris: Secondary | ICD-10-CM | POA: Diagnosis not present

## 2018-10-11 DIAGNOSIS — R195 Other fecal abnormalities: Secondary | ICD-10-CM

## 2018-10-11 DIAGNOSIS — Z7902 Long term (current) use of antithrombotics/antiplatelets: Secondary | ICD-10-CM

## 2018-10-11 DIAGNOSIS — D649 Anemia, unspecified: Secondary | ICD-10-CM

## 2018-10-11 LAB — CBC WITH DIFFERENTIAL/PLATELET
Basophils Absolute: 0.2 10*3/uL — ABNORMAL HIGH (ref 0.0–0.1)
Basophils Relative: 1.8 % (ref 0.0–3.0)
Eosinophils Absolute: 0.5 10*3/uL (ref 0.0–0.7)
Eosinophils Relative: 4.6 % (ref 0.0–5.0)
HCT: 35.7 % — ABNORMAL LOW (ref 39.0–52.0)
Hemoglobin: 11.9 g/dL — ABNORMAL LOW (ref 13.0–17.0)
LYMPHS ABS: 2.4 10*3/uL (ref 0.7–4.0)
Lymphocytes Relative: 21.7 % (ref 12.0–46.0)
MCHC: 33.4 g/dL (ref 30.0–36.0)
MCV: 93 fl (ref 78.0–100.0)
Monocytes Absolute: 1 10*3/uL (ref 0.1–1.0)
Monocytes Relative: 9.4 % (ref 3.0–12.0)
NEUTROS ABS: 6.7 10*3/uL (ref 1.4–7.7)
NEUTROS PCT: 62.5 % (ref 43.0–77.0)
Platelets: 324 10*3/uL (ref 150.0–400.0)
RBC: 3.84 Mil/uL — ABNORMAL LOW (ref 4.22–5.81)
RDW: 16.2 % — AB (ref 11.5–15.5)
WBC: 10.8 10*3/uL — ABNORMAL HIGH (ref 4.0–10.5)

## 2018-10-11 LAB — IBC PANEL
Iron: 98 ug/dL (ref 42–165)
Saturation Ratios: 29.2 % (ref 20.0–50.0)
Transferrin: 240 mg/dL (ref 212.0–360.0)

## 2018-10-11 LAB — FERRITIN: Ferritin: 27.5 ng/mL (ref 22.0–322.0)

## 2018-10-11 NOTE — Patient Instructions (Addendum)
Your provider has requested that you go to the basement level for lab work before leaving today. Press "B" on the elevator. The lab is located at the first door on the left as you exit the elevator. Continue Pepcid 20 mg twice daily.  Off aspirin.  You have been scheduled for an endoscopy and colonoscopy. Please follow the written instructions given to you at your visit today. We have given you a sample colonoscopy prep.  If you use inhalers (even only as needed), please bring them with you on the day of your procedure. Normal BMI (Body Mass Index- based on height and weight) is between 23 and 30. Your BMI today is Body mass index is 26.29 kg/m. Marland Kitchen Please consider follow up  regarding your BMI with your Primary Care Provider.

## 2018-10-11 NOTE — Progress Notes (Addendum)
Subjective:    Patient ID: Joshua [name], male    DOB: 13-Oct-1931, 85 y.o.   MRN: [id number]  HPI "Joshua [name]" is a very nice 85 year old white male, known remotely to Joshua [name] from remote colonoscopy done in January 2010 This was a normal exam.  He does have prior history of colon polyps. He is referred today by Joshua [name] for recent Hemoccult positive stool and mild normocytic anemia. Patient is on chronic Plavix and had been on baby aspirin which was just stopped.  He has history of coronary artery disease, status post MI in 2017 and had drug-eluting stent to the RCA.  He is followed by Joshua [name].  Also with history of second-degree AV block status post pacemaker.  He has history of prior bladder cancer and prostate cancer for which he underwent seed implants.  He also has history of a seizure disorder. Patient says he feels fine currently has no regular issues with heartburn or indigestion, no dysphagia or odynophagia, no complaints of nausea or abdominal pain.  Appetite has been good, weight has been stable.  He has not noted any changes with his bowel habits, nor any melena or hematochezia. He had labs done in August 2019 with hemoglobin 11.7 hematocrit of 34.3 Labs December 2019 hemoglobin 12.3 hematocrit of 34.7 Labs 10/02/2018 hemoglobin 11.1 hematocrit of 32.7 and MCV of 97. Hemoccults 10/04/2018 were positive, was repeated on 10/08/2018 and Hemoccult negative.  Review of Systems Pertinent positive and negative review of systems were noted in the above HPI section.  All other review of systems was otherwise negative.  Outpatient Encounter Medications as of 10/11/2018  Medication Sig  . calcium carbonate (OSCAL) 1500 (600 Ca) MG TABS tablet Take 600 mg of elemental calcium by mouth daily with breakfast.  . Cholecalciferol (VITAMIN D3) 1000 units CAPS Take 1 capsule by mouth daily.  . citalopram (CELEXA) 20 MG tablet Take 1 tablet (20 mg total) by mouth daily.  . clopidogrel  (PLAVIX) 75 MG tablet Take 1 tablet (75 mg total) by mouth daily.  Marland Kitchen MAGNESIUM PO Take 1 tablet by mouth at bedtime.  . Multiple Vitamins-Minerals (PRESERVISION AREDS 2) CAPS Take 1 capsule by mouth 2 (two) times daily.   Marland Kitchen PHENobarbital (LUMINAL) 64.8 MG tablet Take 1 tablet (64.8 mg total) by mouth 2 (two) times daily.  . phenytoin (DILANTIN) 100 MG ER capsule Take 2 capsules (200 mg total) by mouth 2 (two) times daily.  . ranitidine (ZANTAC) 75 MG tablet Take 75 mg by mouth 2 (two) times daily.  Marland Kitchen spironolactone (ALDACTONE) 25 MG tablet Take 1 tablet (25 mg total) by mouth daily.  . [DISCONTINUED] atorvastatin (LIPITOR) 20 MG tablet Take 1 tablet (20 mg total) by mouth daily.   No facility-administered encounter medications on file as of 10/11/2018.    Allergies  Allergen Reactions  . Penicillins Rash    Has patient had a PCN reaction causing immediate rash, facial/tongue/throat swelling, SOB or lightheadedness with hypotension: ##Yes## Has patient had a PCN reaction causing severe rash involving mucus membranes or skin necrosis: No Has patient had a PCN reaction that required hospitalization No Has patient had a PCN reaction occurring within the last 10 years: No If all of the above answers are "NO", then may proceed with Cephalosporin use.    Patient Active Problem List   Diagnosis Date Noted  . Pacemaker electrode infection ([place]) 01/22/2018  . Pacemaker infection ([place]) 01/22/2018  . Second degree AV block 06/15/2017  .  Aortic atherosclerosis ([place]) 06/03/2016  . General weakness   . Protein-calorie malnutrition, severe ([place])   . GI bleed 06/01/2016  . Old MI (myocardial infarction) 04/27/2016  . Bladder cancer ([place]) 04/18/2016  . Vitamin D deficiency 10/15/2015  . Prostate cancer ([place])   . Hypercholesterolemia   . Essential hypertension   . Seizures ([place])   . GERD 08/22/2008  . COLONIC POLYPS, ADENOMATOUS, HX OF 08/22/2008   Social History   Socioeconomic History  .  Marital status: Widowed    Spouse name: Not on file  . Number of children: 2  . Years of education: Not on file  . Highest education level: Not on file  Occupational History  . Occupation: Retired  Scientific laboratory technician  . Financial resource strain: Not on file  . Food insecurity:    Worry: Not on file    Inability: Not on file  . Transportation needs:    Medical: Not on file    Non-medical: Not on file  Tobacco Use  . Smoking status: Former Smoker    Packs/day: 2.00    Years: 15.00    Pack years: 30.00    Last attempt to quit: 01/15/1973    Years since quitting: 45.7  . Smokeless tobacco: Never Used  Substance and Sexual Activity  . Alcohol use: Yes    Comment: occasionally  . Drug use: No  . Sexual activity: Not on file  Lifestyle  . Physical activity:    Days per week: Not on file    Minutes per session: Not on file  . Stress: Not on file  Relationships  . Social connections:    Talks on phone: Not on file    Gets together: Not on file    Attends religious service: Not on file    Active member of club or organization: Not on file    Attends meetings of clubs or organizations: Not on file    Relationship status: Not on file  . Intimate partner violence:    Fear of current or ex partner: Not on file    Emotionally abused: Not on file    Physically abused: Not on file    Forced sexual activity: Not on file  Other Topics Concern  . Not on file  Social History Narrative   Joshua [name] to [facility] after MI for rehab.    Mr. Kihara family history includes Emphysema in his father and mother; Stroke in his mother.      Objective:    Vitals:   10/11/18 1531  BP: 118/62  Pulse: 70    Physical Exam; well-developed elderly white male in no acute distress, ambulates with a cane, very pleasant.  Height 5 foot 11, weight 188, BMI 26.2.  HEENT; nontraumatic normocephalic EOMI PERRLA sclera anicteric oral mucosa moist, Cardiovascular ;regular rate and rhythm with S1-S2,  there is a pacemaker in the right chest wall.  Pulmonary ;clear bilaterally, Abdomen; soft, nontender nondistended bowel sounds are active there is no palpable mass or hepatosplenomegaly, Rectal; exam not done, recent Hemoccults were completed 1 set positive and 1 set negative.  Extremities ;no clubbing cyanosis or edema skin warm dry, Neuropsych; alert and oriented, grossly nonfocal mood and affect appropriate       Assessment & Plan:   #96 85 year old white male with normocytic anemia and recent Hemoccult positive stool, in setting of chronic Plavix and aspirin. Etiology not clear, and patient has not had any endoscopic evaluation since 2010. Rule out occult colon neoplasm, AVMs, gastropathy.  #  2 coronary artery disease status post MI 2017 with drug-eluting stent RCA 3.  Chronic antiplatelet therapy-on Plavix 4.  Remote history adenomatous polyps 5.  Seizure disorder 6.  History of second-degree AV block status post pacemaker 7.  History of bladder cancer and prostate cancer.  Prostate cancer was treated with seed implants.  Plan; Long discussion with the patient today regarding options for further evaluation.  Generally colonoscopy and upper endoscopy are indicated in this situation.  Patient had some initial reservation due to his advanced age and concerns about coming off of Plavix.  We discussed that generally it is felt safe to interrupt Plavix for procedures, once a year out from coronary stenting. We discussed option of serial hemoglobins, iron studies and CT imaging of the abdomen and pelvis to exclude any obvious malignancy.  He is agreeable to proceed with colonoscopy and upper endoscopy with Joshua [name].  Both procedures were discussed in detail including indications risks and benefits and he is agreeable to proceed. We will need to hold Plavix for 5 days prior to procedures, we will communicate with his cardiologist Joshua [name] to assure this is reasonable for this patient.   His aspirin had already been stopped. He will continue Pepcid 20 mg p.o. twice daily which had been recently started by Dr. Laurance Flatten  CBC today and obtain iron studies.     Genia Harold PA-C Nov 03, 2018   Cc: Chipper Herb, MD   Agree with Ms. Genia Harold assessment and plan. Gatha Mayer, MD, [facility]

## 2018-10-11 NOTE — Telephone Encounter (Signed)
[facility] Pre-operative Risk Assessment     Request for surgical clearance:     Endoscopy Procedure  What type of surgery is being performed?     EGD/Colonoscopy  When is this surgery scheduled?     10-31-2018  What type of clearance is required ?   Pharmacy-    Are there any medications that need to be held prior to surgery and how long? Plavix- typically we hold for 5 days prior to the procedure date.   Practice name and name of physician performing surgery?      [facility]  What is your office phone and fax number?      Phone- [phone]  [phone]  Anesthesia type (None, local, MAC, general) ?       MAC

## 2018-10-12 DIAGNOSIS — C44519 Basal cell carcinoma of skin of other part of trunk: Secondary | ICD-10-CM | POA: Diagnosis not present

## 2018-10-12 NOTE — Telephone Encounter (Signed)
   Primary Cardiologist: Sinclair Grooms, MD  Chart reviewed as part of pre-operative protocol coverage.   Chart reviewed by Dr. Tamala Julian. Per his recommendation, patient may hold plavix 5 days prior to his upcoming colonoscopy (10/26/2018-10/31/2018) and should restart this medication when cleared to do so by Dr. Carlean Purl.   I will route this recommendation to the requesting party via Epic fax function and remove from pre-op pool.  Please call with questions.  Abigail Butts, PA-C 10/12/2018, 3:17 PM

## 2018-10-12 NOTE — Telephone Encounter (Signed)
Called the patient and advised he can hold the Plavix from 2-7 through 2-12. Dr. Carlean Purl will advise when to resume the Plavix.  The patient verbalized understanding the instructions. Also I call and spoke to the patient's son Volney Reierson and gave him the Plavix clearance instructions.

## 2018-10-12 NOTE — Telephone Encounter (Signed)
Yes it is okay to hold Plavix.

## 2018-10-17 ENCOUNTER — Other Ambulatory Visit: Payer: Self-pay | Admitting: *Deleted

## 2018-10-17 MED ORDER — PHENOBARBITAL 64.8 MG PO TABS: 64.8000 mg | ORAL_TABLET | Freq: Two times a day (BID) | ORAL | 1 refills | Status: DC

## 2018-10-18 ENCOUNTER — Telehealth: Payer: Self-pay | Admitting: Family Medicine

## 2018-10-18 ENCOUNTER — Other Ambulatory Visit: Payer: Self-pay | Admitting: *Deleted

## 2018-10-18 MED ORDER — PHENOBARBITAL 64.8 MG PO TABS: 64.8000 mg | ORAL_TABLET | Freq: Two times a day (BID) | ORAL | 0 refills | Status: DC

## 2018-10-18 NOTE — Telephone Encounter (Signed)
Patient aware medication was sent to pharmacy.

## 2018-10-31 ENCOUNTER — Ambulatory Visit (AMBULATORY_SURGERY_CENTER): Payer: Medicare Other | Admitting: Internal Medicine

## 2018-10-31 ENCOUNTER — Encounter: Payer: Self-pay | Admitting: Internal Medicine

## 2018-10-31 VITALS — BP 133/62 | HR 60 | Temp 96.9°F | Resp 12 | Ht 71.0 in | Wt 188.0 lb

## 2018-10-31 DIAGNOSIS — R195 Other fecal abnormalities: Secondary | ICD-10-CM | POA: Diagnosis not present

## 2018-10-31 DIAGNOSIS — D509 Iron deficiency anemia, unspecified: Secondary | ICD-10-CM | POA: Diagnosis not present

## 2018-10-31 DIAGNOSIS — K2971 Gastritis, unspecified, with bleeding: Secondary | ICD-10-CM

## 2018-10-31 DIAGNOSIS — K319 Disease of stomach and duodenum, unspecified: Secondary | ICD-10-CM | POA: Diagnosis not present

## 2018-10-31 DIAGNOSIS — K2970 Gastritis, unspecified, without bleeding: Secondary | ICD-10-CM | POA: Diagnosis not present

## 2018-10-31 DIAGNOSIS — D508 Other iron deficiency anemias: Secondary | ICD-10-CM | POA: Diagnosis not present

## 2018-10-31 DIAGNOSIS — K2951 Unspecified chronic gastritis with bleeding: Secondary | ICD-10-CM

## 2018-10-31 MED ORDER — FERROUS SULFATE 325 (65 FE) MG PO TABS: 325.0000 mg | ORAL_TABLET | Freq: Every day | ORAL | 3 refills | Status: AC

## 2018-10-31 MED ORDER — SODIUM CHLORIDE 0.9 % IV SOLN: 500.0000 mL | Freq: Once | INTRAVENOUS | Status: DC

## 2018-10-31 NOTE — Progress Notes (Signed)
Called to room to assist during endoscopic procedure.  Patient ID and intended procedure confirmed with present staff. Received instructions for my participation in the procedure from the performing physician.  I relieved Christell Constant, RN. maw

## 2018-10-31 NOTE — Progress Notes (Signed)
PT taken to PACU. Monitors in place. VSS. Report given to RN. 

## 2018-10-31 NOTE — Op Note (Signed)
[facility] Patient Name: Raiquan Chandler Procedure Date: 10/31/2018 3:53 PM MRN: [id number] Endoscopist: Gatha Mayer , MD Age: 85 Referring MD:  Date of Birth: [birth year] Gender: Male Account #: [id number] Procedure:                Upper GI endoscopy Indications:              Iron deficiency anemia, Heme positive stool Medicines:                Propofol per Anesthesia, Monitored Anesthesia Care Procedure:                Pre-Anesthesia Assessment:                           - Prior to the procedure, a History and Physical                            was performed, and patient medications and                            allergies were reviewed. The patient's tolerance of                            previous anesthesia was also reviewed. The risks                            and benefits of the procedure and the sedation                            options and risks were discussed with the patient.                            All questions were answered, and informed consent                            was obtained. Prior Anticoagulants: The patient has                            taken no previous anticoagulant or antiplatelet                            agents. ASA Grade Assessment: III - A patient with                            severe systemic disease. After reviewing the risks                            and benefits, the patient was deemed in                            satisfactory condition to undergo the procedure.                           After obtaining informed consent, the endoscope was  passed under direct vision. Throughout the                            procedure, the patient's blood pressure, pulse, and                            oxygen saturations were monitored continuously. The                            Endoscope was introduced through the mouth, and                            advanced to the second part of duodenum. The upper               GI endoscopy was accomplished without difficulty.                            The patient tolerated the procedure well. Scope In: Scope Out: Findings:                 Patchy mild inflammation characterized by erosions                            and erythema was found in the gastric body.                            Biopsies were taken with a cold forceps for                            histology. Verification of patient identification                            for the specimen was done. Estimated blood loss was                            minimal.                           Localized nodular mucosa was found in the duodenal                            bulb. Biopsies were taken with a cold forceps for                            histology. Verification of patient identification                            for the specimen was done. Estimated blood loss was                            minimal.                           The exam was otherwise without abnormality.  The cardia and gastric fundus were normal on                            retroflexion. Complications:            No immediate complications. Estimated Blood Loss:     Estimated blood loss was minimal. Impression:               - Gastritis. Biopsied. (? if this was worse before                            H2 blocker)                           - Nodular mucosa in the duodenal bulb. Biopsied.                           - The examination was otherwise normal. Recommendation:           - Patient has a contact number available for                            emergencies. The signs and symptoms of potential                            delayed complications were discussed with the                            patient. Return to normal activities tomorrow.                            Written discharge instructions were provided to the                            patient.                           - Resume previous  diet.                           - Continue present medications.                           - Await pathology results.                           - Resume Plavix (clopidogrel) at prior dose                            tomorrow.                           - See the other procedure note for documentation of                            additional recommendations. Gatha Mayer, MD 10/31/2018 4:30:34 PM This report has been signed electronically.

## 2018-10-31 NOTE — Patient Instructions (Addendum)
There was some mild stomach inflammation - gastritis - I took biopsies. There was some nodular change to the lining of the duodenum - beginning of the intestine. I took biopsies.  No problems in the colon.  No obvious cause of bleeding but it is possible you bled from the gastritis and it is improved since starting Pepcid AC.  You do need to take iron pills and follow-up with Dr. Laurance Flatten.  I will check the biopsy results and let you know the results and plans.  I appreciate the opportunity to care for you. Gatha Mayer, MD, [facility]  Please read handouts provided. Continue present medications. Await pathology results. Resume Plavix at prior dose tomorrow.      YOU HAD AN ENDOSCOPIC PROCEDURE TODAY AT [facility]:   Refer to the procedure report that was given to you for any specific questions about what was found during the examination.  If the procedure report does not answer your questions, please call your gastroenterologist to clarify.  If you requested that your care partner not be given the details of your procedure findings, then the procedure report has been included in a sealed envelope for you to review at your convenience later.  YOU SHOULD EXPECT: Some feelings of bloating in the abdomen. Passage of more gas than usual.  Walking can help get rid of the air that was put into your GI tract during the procedure and reduce the bloating. If you had a lower endoscopy (such as a colonoscopy or flexible sigmoidoscopy) you may notice spotting of blood in your stool or on the toilet paper. If you underwent a bowel prep for your procedure, you may not have a normal bowel movement for a few days.  Please Note:  You might notice some irritation and congestion in your nose or some drainage.  This is from the oxygen used during your procedure.  There is no need for concern and it should clear up in a day or so.  SYMPTOMS TO REPORT IMMEDIATELY:   Following lower  endoscopy (colonoscopy or flexible sigmoidoscopy):  Excessive amounts of blood in the stool  Significant tenderness or worsening of abdominal pains  Swelling of the abdomen that is new, acute  Fever of 100F or higher   Following upper endoscopy (EGD)  Vomiting of blood or coffee ground material  New chest pain or pain under the shoulder blades  Painful or persistently difficult swallowing  New shortness of breath  Fever of 100F or higher  Black, tarry-looking stools  For urgent or emergent issues, a gastroenterologist can be reached at any hour by calling [phone].   DIET:  We do recommend a small meal at first, but then you may proceed to your regular diet.  Drink plenty of fluids but you should avoid alcoholic beverages for 24 hours.  ACTIVITY:  You should plan to take it easy for the rest of today and you should NOT DRIVE or use heavy machinery until tomorrow (because of the sedation medicines used during the test).    FOLLOW UP: Our staff will call the number listed on your records the next business day following your procedure to check on you and address any questions or concerns that you may have regarding the information given to you following your procedure. If we do not reach you, we will leave a message.  However, if you are feeling well and you are not experiencing any problems, there is no need to return our call.  We will assume that you have returned to your regular daily activities without incident.  If any biopsies were taken you will be contacted by phone or by letter within the next 1-3 weeks.  Please call us at [phone] if you have not heard about the biopsies in 3 weeks.    SIGNATURES/CONFIDENTIALITY: You and/or your care partner have signed paperwork which will be entered into your electronic medical record.  These signatures attest to the fact that that the information above on your After Visit Summary has been reviewed and is understood.  Full  responsibility of the confidentiality of this discharge information lies with you and/or your care-partner.

## 2018-10-31 NOTE — Op Note (Signed)
[name] Name: Joshua [name] Procedure Date: 10/31/2018 3:52 PM MRN: [id number] Endoscopist: Gatha Mayer , MD Age: 85 Referring MD:  Date of Birth: 1931-12-02 Gender: Male Account #: [id number] Procedure:                Colonoscopy Indications:              Heme positive stool, Iron deficiency anemia Medicines:                Propofol per Anesthesia, Monitored Anesthesia Care Procedure:                Pre-Anesthesia Assessment:                           - Prior to the procedure, a History and Physical                            was performed, and patient medications and                            allergies were reviewed. The patient's tolerance of                            previous anesthesia was also reviewed. The risks                            and benefits of the procedure and the sedation                            options and risks were discussed with the patient.                            All questions were answered, and informed consent                            was obtained. Prior Anticoagulants: The patient                            last took Plavix (clopidogrel) 5 days prior to the                            procedure. ASA Grade Assessment: III - A patient                            with severe systemic disease. After reviewing the                            risks and benefits, the patient was deemed in                            satisfactory condition to undergo the procedure.                           After obtaining informed consent, the colonoscope  was passed under direct vision. Throughout the                            procedure, the patient's blood pressure, pulse, and                            oxygen saturations were monitored continuously. The                            Colonoscope was introduced through the anus and                            advanced to the the terminal ileum, with   identification of the appendiceal orifice and IC                            valve. The colonoscopy was performed without                            difficulty. The patient tolerated the procedure                            well. The quality of the bowel preparation was                            good. The terminal ileum, the appendiceal orifice                            and the rectum were photographed. Scope In: 4:06:36 PM Scope Out: 4:19:04 PM Scope Withdrawal Time: 0 hours 9 minutes 41 seconds  Total Procedure Duration: 0 hours 12 minutes 28 seconds  Findings:                 The perianal and digital rectal examinations were                            normal.                           The terminal ileum appeared normal.                           The entire examined colon appeared normal on direct                            and retroflexion views. Complications:            No immediate complications. Estimated Blood Loss:     Estimated blood loss: none. Impression:               - The examined portion of the ileum was normal.                           - The entire examined colon is normal on direct and  retroflexion views.                           - No specimens collected. Recommendation:           - Patient has a contact number available for                            emergencies. The signs and symptoms of potential                            delayed complications were discussed with the                            patient. Return to normal activities tomorrow.                            Written discharge instructions were provided to the                            patient.                           - Resume previous diet.                           - Continue present medications.                           - Resume Plavix (clopidogrel) at prior dose                            tomorrow.                           - No repeat colonoscopy due to age. Gatha Mayer, MD 10/31/2018 4:33:18 PM This report has been signed electronically.

## 2018-10-31 NOTE — Progress Notes (Signed)
Pt's states no medical or surgical changes since previsit or office visit. 

## 2018-10-31 NOTE — Progress Notes (Signed)
Called to room to assist during endoscopic procedure.  Patient ID and intended procedure confirmed with present staff. Received instructions for my participation in the procedure from the performing physician.  

## 2018-11-01 ENCOUNTER — Telehealth: Payer: Self-pay | Admitting: *Deleted

## 2018-11-01 ENCOUNTER — Telehealth: Payer: Self-pay

## 2018-11-01 NOTE — Telephone Encounter (Signed)
Pt returned call stating that he is feeling fine.

## 2018-11-01 NOTE — Telephone Encounter (Signed)
Left message, will try again later

## 2018-11-01 NOTE — Telephone Encounter (Signed)
  Follow up Call-  Call back number 10/31/2018  Post procedure Call Back phone  # [phone]  Permission to leave phone message Yes  Some recent data might be hidden    [facility]

## 2018-11-05 ENCOUNTER — Encounter: Payer: Self-pay | Admitting: Internal Medicine

## 2018-11-08 DIAGNOSIS — H353231 Exudative age-related macular degeneration, bilateral, with active choroidal neovascularization: Secondary | ICD-10-CM | POA: Diagnosis not present

## 2018-11-15 ENCOUNTER — Other Ambulatory Visit: Payer: Self-pay | Admitting: Family Medicine

## 2018-11-15 ENCOUNTER — Ambulatory Visit (INDEPENDENT_AMBULATORY_CARE_PROVIDER_SITE_OTHER): Payer: Medicare Other | Admitting: *Deleted

## 2018-11-15 ENCOUNTER — Other Ambulatory Visit: Payer: Self-pay

## 2018-11-15 VITALS — BP 147/79 | HR 73 | Ht 70.0 in | Wt 190.4 lb

## 2018-11-15 DIAGNOSIS — Z0000 Encounter for general adult medical examination without abnormal findings: Secondary | ICD-10-CM

## 2018-11-15 NOTE — Progress Notes (Addendum)
Subjective:   Joshua [name] is a 85 y.o. male who presents for an initial Medicare Annual Wellness Visit. 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 He enjoys wood working and walking and attends exercise class 3 times weekly for 45 minutes. He attends church every Sunday and gets to see his son daily and his daughter lives in [place] working as a Pharmacist, hospital. He does live by himself but has someone come in to cook and clean.  Patient Care Team: Chipper Herb, MD as PCP - General (Family Medicine) Belva Crome, MD as PCP - Cardiology (Cardiology) Gatha Mayer, MD as Consulting Physician (Gastroenterology) Gerarda Fraction, MD as Referring Physician (Ophthalmology) [name], MD as Attending Physician (Urology)  Hospitalizations, surgeries, and ER visits in previous 12 months No hospitalizations, ER visits, or surgeries this past year.  Review of Systems    Patient reports that his overall health is unchanged compared to last year.  Cardiac Risk Factors include: advanced age (>3men, >31 women);male gender    All other systems negative       Current Medications (verified) Outpatient Encounter Medications as of 11/15/2018  Medication Sig  . calcium carbonate (OSCAL) 1500 (600 Ca) MG TABS tablet Take 600 mg of elemental calcium by mouth daily with breakfast.  . Cholecalciferol (VITAMIN D3) 1000 units CAPS Take 1 capsule by mouth daily.  . citalopram (CELEXA) 20 MG tablet Take 1 tablet (20 mg total) by mouth daily.  . clopidogrel (PLAVIX) 75 MG tablet Take 1 tablet (75 mg total) by mouth daily.  . ferrous sulfate 325 (65 FE) MG tablet Take 1 tablet (325 mg total) by mouth daily with breakfast.  . MAGNESIUM PO Take 1 tablet by mouth at bedtime.  . Multiple Vitamins-Minerals (PRESERVISION AREDS 2) CAPS Take 1 capsule by mouth 2 (two) times daily.   Marland Kitchen PHENobarbital (LUMINAL) 64.8 MG  tablet Take 1 tablet (64.8 mg total) by mouth 2 (two) times daily.  . phenytoin (DILANTIN) 100 MG ER capsule Take 2 capsules (200 mg total) by mouth 2 (two) times daily.  . ranitidine (ZANTAC) 75 MG tablet Take 75 mg by mouth 2 (two) times daily.  Marland Kitchen spironolactone (ALDACTONE) 25 MG tablet Take 1 tablet (25 mg total) by mouth daily.   No facility-administered encounter medications on file as of 11/15/2018.     Allergies (verified) Penicillins   History: Past Medical History:  Diagnosis Date  . 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 Arthritis   . Bilateral lower extremity edema   . Bladder tumor   . CAD (coronary artery disease) cardiologist--  dr Daneen Schick   a. 04/27/2016 PCI with DES to RCA with 50% ostial to 60% segmental mid to distal left main, and 90% thrombus filled ostial to proximal circumflex, with distal right coronary filled by collaterals from left-to-right  . COPD with emphysema ([place])   . Dyspnea on exertion   . ED (erectile dysfunction)   . First degree AV block   . GERD (gastroesophageal reflux disease)   . History of adenomatous polyp of colon    09-22-2008  . History of bladder cancer    s/p  resection bladder tumor 04-19-2016  non-invasive low-grade urothelial carcinoma  . History of prostate cancer urologist-  dr Jeffie Pollock-- last PSA 0.02 (summer 2017)   dx 02/ 2013-- Stage T2a, Gleason 7,  PSA 1.58--  s/p  radioactive prostate seed implants 01-19-2012  . History of ST elevation myocardial infarction (STEMI)    04-27-2016  . Hyperlipidemia   . Hypertension   . Presence of permanent cardiac pacemaker   . S/P drug eluting coronary stent placement    04-27-2016   . Second degree Mobitz I AV block    occasional w/ first degree heart block  per cardiologist note by dr Daneen Schick  . Seizures ([place]) per pt son -- no seizure's since 1980   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  arteriorvenous cerebral  malformation s/p  craniotomy 1980  . Vitamin D deficiency    Past Surgical History:  Procedure Laterality Date  . APPENDECTOMY  2011  . BLEPHAROPLASTY Bilateral revision 02-17-2016  . CARDIAC CATHETERIZATION N/A 04/27/2016   Procedure: Left Heart Cath and Coronary Angiography;  Surgeon: Belva Crome, MD;  Location: [facility];  Service: Cardiovascular;  Laterality: N/A;  acute inferior wall STEMI ;  ostCx to midCx 90%,  ost1st Mrg to 1st Mrg 50%,  mid to distal RCA 100%,  pRCA 80%,  LVEF 45-50% w/ inferobasal akinesis  . CARDIAC CATHETERIZATION N/A 04/27/2016   Procedure: Temporary Pacemaker;  Surgeon: Belva Crome, MD;  Location: [facility];  Service: Cardiovascular;  Laterality: N/A;  mobitz 2  second degree HB w/ heart rate 29bpm  . CARDIAC CATHETERIZATION N/A 04/27/2016   Procedure: Coronary Stent Intervention;  Surgeon: Belva Crome, MD;  Location: [facility];  Service: Cardiovascular;  Laterality: N/A; DES x1 to  Proximal RCA;  DES x1 to Mid RCA  . CATARACT EXTRACTION W/ INTRAOCULAR LENS  IMPLANT, BILATERAL  2016  approx.  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 CYSTOSCOPY  01/19/2012   Procedure: CYSTOSCOPY;  Surgeon: Malka So, MD;  Location: [facility];  Service: Urology;;  no seeds found in bladder  . CYSTOSCOPY W/ RETROGRADES Bilateral 04/19/2016   Procedure: CYSTOSCOPY WITH BILATERAL RETROGRADE PYELOGRAM TRANSURETHRAL RESECTION OF BLADDER TUMOR ;  Surgeon: [name], MD;  Location: [facility];  Service: Urology;  Laterality: Bilateral;  . CYSTOSCOPY WITH BIOPSY N/A 11/10/2016   Procedure: CYSTOSCOPY WITH BIOPSY AND FULGURATION;  Surgeon: [name], MD;  Location: [facility];  Service: Urology;  Laterality: N/A;  . INSERT / REPLACE / REMOVE PACEMAKER  06/15/2017  . LEAD EXTRACTION N/A 01/22/2018   Procedure: LEAD EXTRACTION;  Surgeon: Evans Lance, MD;  Location: [facility];  Service: Cardiovascular;  Laterality: N/A;  . PACEMAKER IMPLANT N/A 06/15/2017   Procedure: Pacemaker Implant;  Surgeon: [name], MD;  Location: [facility];  Service: Cardiovascular;  Laterality: N/A;  . PACEMAKER IMPLANT N/A 01/25/2018   Procedure: PACEMAKER IMPLANT;  Surgeon: Evans Lance, MD;  Location: [facility];  Service: Cardiovascular;  Laterality: N/A;  . PPM GENERATOR REMOVAL N/A 01/22/2018   Procedure: PPM GENERATOR REMOVAL;  Surgeon: Evans Lance, MD;  Location: [facility];  Service: Cardiovascular;  Laterality: N/A;  . RADIOACTIVE SEED IMPLANT  01/19/2012   Procedure: RADIOACTIVE SEED IMPLANT;  Surgeon: Malka So, MD;  Location[facility];  Service: Urology;  Laterality: N/A;  68 seeds implanted  . TRANSTHORACIC ECHOCARDIOGRAM  06/02/2016   ef 60-65%/  trivial Joshua/  mild TR   Family History  Problem Relation Age of Onset  . Emphysema  Mother        age 52 deceased  . Stroke Mother   . Emphysema Father        deceased age 46   Social History   Socioeconomic History  . Marital status: Widowed    Spouse name: Not on file  . Number of children: 2  . Years of education: 1 year of college  . Highest education level: Not on file  Occupational History  . Occupation: Retired  Scientific laboratory technician  . Financial resource strain: Not hard at all  . Food insecurity:    Worry: Never true    Inability: Never true  . Transportation needs:    Medical: No    Non-medical: No  Tobacco Use  . Smoking status: Former Smoker    Packs/day: 2.00    Years: 15.00    Pack years: 30.00    Last attempt to quit: 01/15/1973    Years since quitting: 45.8  . Smokeless tobacco: Never Used  Substance and Sexual Activity  . Alcohol use: Yes    Comment: occasionally  . Drug use: No  . Sexual activity: Not Currently  Lifestyle  . Physical activity:    Days per week: 3 days    Minutes per session: 50 min  . Stress: Not at all  Relationships  .  Social connections:    Talks on phone: More than three times a week    Gets together: More than three times a week    Attends religious service: More than 4 times per year    Active member of club or organization: Yes    Attends meetings of clubs or organizations: More than 4 times per year    Relationship status: Widowed  Other Topics Concern  . Not on file  Social History Narrative   [facility] to [facility] after MI for rehab.     Clinical Intake:  Pre-visit preparation completed: Yes  Pain : No/denies pain     BMI - recorded: 27.32 Nutritional Status: BMI 25 -29 Overweight Nutritional Risks: None Diabetes: No  How often do you need to have someone help you when you read instructions, pamphlets, or other written materials from your doctor or pharmacy?: 1 - Never What is the last grade level you completed in school?: 12th  Interpreter Needed?: No  Information entered by :: Otilio Connors, LPN   Activities of Daily Living In your present state of health, do you have any difficulty performing the following activities: 11/15/2018 01/22/2018  Hearing? Y N  Comment pt states he had hearing test and had some deficiency but pt declined hearing aid -  Vision? Y N  Comment pt has macular degeneration, and he does wear reading glasses -  Difficulty concentrating or making decisions? N N  Walking or climbing stairs? N N  Dressing or bathing? N N  Doing errands, shopping? N N  Preparing Food and eating ? Y -  Comment someone comes and cooks for him  -  Using the Toilet? N -  In the past six months, have you accidently leaked urine? Y -  Comment at night he states he can't get to the bathroom quick enough and dribbles -  Do you have problems with loss of bowel control? N -  Managing your Medications? N -  Managing your Finances? N -  Housekeeping or managing your Housekeeping? Y -  Comment he has someone who comes in and helps him with the housekeeping -  Some recent data  might be hidden     Exercise Current Exercise Habits: Structured exercise class, Type of exercise: walking;Other - see comments, Time (Minutes): 45, Frequency (Times/Week): 3, Weekly Exercise (Minutes/Week): 135, Intensity: Mild, Exercise limited by: orthopedic condition(s)  Diet Consumes 3 meals a day and 0 snacks a day.  The patient feels that he mostly follow a Regular diet.  Diet History  Pt has someone that come in and prepares his meals for him which includes chicken, fish, beef and a variety of fruits and vegetables. He has access to all the food he needs.   Depression Screen PHQ 2/9 Scores 11/15/2018 10/02/2018 05/10/2018 12/12/2017 08/18/2017 08/07/2017 04/13/2017  PHQ - 2 Score 0 0 0 0 0 0 0     Fall Risk Fall Risk  11/15/2018 10/02/2018 05/10/2018 12/12/2017 08/18/2017  Falls in the past year? 1 0 Yes Yes No  Number falls in past yr: 1 - 2 or more 2 or more -  Injury with Fall? 0 - No No -  Comment - - - - -  Risk Factor Category  - - - - -  Risk for fall due to : Impaired balance/gait;Impaired mobility - - - -  Follow up Education provided;Falls prevention discussed - - - -     Objective:    Today's Vitals   11/15/18 1521 11/15/18 1601  BP: (!) 145/74 (!) 147/79  Pulse: 73   Weight: 190 lb 6 oz (86.4 kg)   Height: 5\' 10"  (1.778 m)    Body mass index is 27.32 kg/m.  Advanced Directives 11/15/2018 01/22/2018 01/22/2018 06/15/2017 02/10/2017 01/30/2017 12/28/2016  Does Patient Have a Medical Advance Directive? Yes Yes Yes Yes Yes Yes No  Type of Advance Directive Living will [place];Living will [place];Living will [place];Living will - - -  Does patient want to make changes to medical advance directive? No - Patient declined No - Patient declined No - Patient declined No - Patient declined - - -  Copy of [place] in Chart? No- copy requested - No - copy requested No - copy requested - - -    Would patient like information on creating a medical advance directive? - - - - - - No - Patient declined  Pre-existing out of facility DNR order (yellow form or pink MOST form) - - - - - - -    Hearing/Vision  Pt has deficit but declined hearing aids at evaluation. Pt wears glasses.  Cognitive Function: MMSE - Mini Mental State Exam 11/15/2018  Orientation to time 5  Orientation to Place 5  Registration 3  Attention/ Calculation 5  Recall 2  Language- name 2 objects 2  Language- repeat 1  Language- follow 3 step command 3  Language- read & follow direction 1  Write a sentence 1  Copy design 1  Total score 29           Immunizations and Health Maintenance Immunization History  Administered Date(s) Administered  . Influenza, High Dose Seasonal PF 07/08/2016, 08/18/2017, 08/29/2018  . Influenza,inj,Quad PF,6+ Mos 07/08/2013, 09/10/2014, 08/26/2015  . Pneumococcal Conjugate-13 07/08/2013  . Pneumococcal Polysaccharide-23 08/18/2017   There are no preventive care reminders to display for this patient. Health Maintenance  Topic Date Due  . TETANUS/TDAP  08/19/2021  . INFLUENZA VACCINE  Completed  . PNA vac Low Risk Adult  Completed        Assessment:   This is a routine wellness examination for  Joshua [name].    Plan:    Goals    . DIET - EAT MORE FRUITS AND VEGETABLES        Health Maintenance & Additional Screening Recommendations: Advanced directives: has an advanced directive - a copy HAS NOT been provided.  Lung: Low Dose CT Chest recommended if Age 58-80 years, 30 pack-year currently smoking OR have quit w/in 15years. Patient does not qualify. Hepatitis C Screening recommended: not applicable  Today's Orders   Keep f/u with Chipper Herb, MD and any other specialty appointments you may have Continue current medications Move carefully to avoid falls. Use assistive devices like a cane or walker if needed. Aim for at least 150 minutes of moderate activity  a week. This can be done with chair exercises if necessary. Read or work on puzzles daily Stay connected with friends and family  I have personally reviewed and noted the following in the patient's chart:   . Medical and social history . Use of alcohol, tobacco or illicit drugs  . Current medications and supplements . Functional ability and status . Nutritional status . Physical activity . Advanced directives . List of other physicians . Hospitalizations, surgeries, and ER visits in previous 12 months . Vitals . Screenings to include cognitive, depression, and falls . Referrals and appointments  In addition, I have reviewed and discussed with patient certain preventive protocols, quality metrics, and best practice recommendations. A written personalized care plan for preventive services as well as general preventive health recommendations were provided to patient.     Otilio Connors, LPN  1/[id number]/[id number]    I have reviewed and agree with the above AWV documentation.   Mary-Margaret Hassell Done, FNP

## 2018-11-23 DIAGNOSIS — L821 Other seborrheic keratosis: Secondary | ICD-10-CM | POA: Diagnosis not present

## 2018-11-23 DIAGNOSIS — L820 Inflamed seborrheic keratosis: Secondary | ICD-10-CM | POA: Diagnosis not present

## 2018-11-23 DIAGNOSIS — Z85828 Personal history of other malignant neoplasm of skin: Secondary | ICD-10-CM | POA: Diagnosis not present

## 2018-11-27 DIAGNOSIS — Z8546 Personal history of malignant neoplasm of prostate: Secondary | ICD-10-CM | POA: Diagnosis not present

## 2018-11-29 DIAGNOSIS — H353231 Exudative age-related macular degeneration, bilateral, with active choroidal neovascularization: Secondary | ICD-10-CM | POA: Diagnosis not present

## 2018-12-03 DIAGNOSIS — Z8546 Personal history of malignant neoplasm of prostate: Secondary | ICD-10-CM | POA: Diagnosis not present

## 2018-12-03 DIAGNOSIS — R3915 Urgency of urination: Secondary | ICD-10-CM | POA: Diagnosis not present

## 2018-12-03 DIAGNOSIS — Z8551 Personal history of malignant neoplasm of bladder: Secondary | ICD-10-CM | POA: Diagnosis not present

## 2018-12-07 ENCOUNTER — Ambulatory Visit: Payer: Medicare Other | Admitting: *Deleted

## 2018-12-07 ENCOUNTER — Other Ambulatory Visit: Payer: Self-pay

## 2018-12-10 ENCOUNTER — Telehealth: Payer: Self-pay

## 2018-12-10 ENCOUNTER — Ambulatory Visit (INDEPENDENT_AMBULATORY_CARE_PROVIDER_SITE_OTHER): Payer: Medicare Other | Admitting: *Deleted

## 2018-12-10 DIAGNOSIS — I441 Atrioventricular block, second degree: Secondary | ICD-10-CM

## 2018-12-10 NOTE — Telephone Encounter (Signed)
Left message for patient to remind of missed remote transmission.  

## 2018-12-15 LAB — CUP PACEART REMOTE DEVICE CHECK
Battery Remaining Longevity: 127 mo
Brady Statistic AP VS Percent: 0.03 %
Brady Statistic AS VP Percent: 61.8 %
Brady Statistic AS VS Percent: 0.51 %
Brady Statistic RA Percent Paced: 37.7 %
Brady Statistic RV Percent Paced: 99.46 %
Date Time Interrogation Session: 20200323174905
Implantable Lead Implant Date: 20190509
Implantable Lead Location: 753859
Implantable Lead Location: 753860
Implantable Lead Model: 3830
Implantable Lead Model: 5076
Implantable Pulse Generator Implant Date: 20190509
Lead Channel Impedance Value: 285 Ohm
Lead Channel Impedance Value: 399 Ohm
Lead Channel Impedance Value: 532 Ohm
Lead Channel Impedance Value: 627 Ohm
Lead Channel Pacing Threshold Amplitude: 1.125 V
Lead Channel Pacing Threshold Amplitude: 1.25 V
Lead Channel Pacing Threshold Pulse Width: 0.4 ms
Lead Channel Pacing Threshold Pulse Width: 0.4 ms
Lead Channel Sensing Intrinsic Amplitude: 0.875 mV
Lead Channel Sensing Intrinsic Amplitude: 0.875 mV
Lead Channel Sensing Intrinsic Amplitude: 3.25 mV
Lead Channel Sensing Intrinsic Amplitude: 3.25 mV
Lead Channel Setting Pacing Amplitude: 2.25 V
Lead Channel Setting Pacing Amplitude: 2.5 V
Lead Channel Setting Pacing Pulse Width: 0.4 ms
Lead Channel Setting Sensing Sensitivity: 0.9 mV
MDC IDC LEAD IMPLANT DT: 20190509
MDC IDC MSMT BATTERY VOLTAGE: 3.03 V
MDC IDC STAT BRADY AP VP PERCENT: 37.66 %

## 2018-12-19 NOTE — Progress Notes (Signed)
Remote pacemaker transmission.   

## 2018-12-20 ENCOUNTER — Other Ambulatory Visit: Payer: Self-pay

## 2019-01-03 DIAGNOSIS — H353231 Exudative age-related macular degeneration, bilateral, with active choroidal neovascularization: Secondary | ICD-10-CM | POA: Diagnosis not present

## 2019-01-09 ENCOUNTER — Telehealth: Payer: Self-pay | Admitting: Interventional Cardiology

## 2019-01-09 NOTE — Telephone Encounter (Signed)
Left message on vm for patient to call back and ask for me - regarding his appt for 4/28

## 2019-01-10 NOTE — Telephone Encounter (Signed)
Per pt returning this office call asked that we please give him a call back.

## 2019-01-10 NOTE — Telephone Encounter (Signed)
Left another message for patient to call back about his appointment on 4/28- need to confirm patient will accept virtual visit

## 2019-01-10 NOTE — Telephone Encounter (Signed)
Patient set up for MyChart? YES SENT CONSENT FORM TO PT - PT ALSO GAVE VERBAL   Is patient using Smartphone/computer/tablet? SMARTPHONE  Did audio/video work  Does patient need telephone visit?NO  Best phone number to use?  [phone]  Special Instructions? PATIENT WILL HAVE BP, WT AND MEDICATION READY      Virtual Visit Pre-Appointment Phone Call  "(Name), I am calling you today to discuss your upcoming appointment. We are currently trying to limit exposure to the virus that causes COVID-19 by seeing patients at home rather than in the office."  1. "What is the BEST phone number to call the day of the visit?" - include this in appointment notes  2. Do you have or have access to (through a family member/friend) a smartphone with video capability that we can use for your visit?" a. If yes - list this number in appt notes as cell (if different from BEST phone #) and list the appointment type as a VIDEO visit in appointment notes b. If no - list the appointment type as a PHONE visit in appointment notes  3. Confirm consent - "In the setting of the current Covid19 crisis, you are scheduled for a (phone or video) visit with your provider on (date) at (time).  Just as we do with many in-office visits, in order for you to participate in this visit, we must obtain consent.  If you'd like, I can send this to your mychart (if signed up) or email for you to review.  Otherwise, I can obtain your verbal consent now.  All virtual visits are billed to your insurance company just like a normal visit would be.  By agreeing to a virtual visit, we'd like you to understand that the technology does not allow for your provider to perform an examination, and thus may limit your provider's ability to fully assess your condition. If your provider identifies any concerns that need to be evaluated in person, we will make arrangements to do so.  Finally, though the technology is pretty good, we cannot assure that  it will always work on either your or our end, and in the setting of a video visit, we may have to convert it to a phone-only visit.  In either situation, we cannot ensure that we have a secure connection.  Are you willing to proceed?" STAFF: Did the patient verbally acknowledge consent to telehealth visit? Document YES/NO here: YES DOXY.ME  4. Advise patient to be prepared - "Two hours prior to your appointment, go ahead and check your blood pressure, pulse, oxygen saturation, and your weight (if you have the equipment to check those) and write them all down. When your visit starts, your provider will ask you for this information. If you have an Apple Watch or Kardia device, please plan to have heart rate information ready on the day of your appointment. Please have a pen and paper handy nearby the day of the visit as well."  5. Give patient instructions for MyChart download to smartphone OR Doximity/Doxy.me as below if video visit (depending on what platform provider is using)  6. Inform patient they will receive a phone call 15 minutes prior to their appointment time (may be from unknown caller ID) so they should be prepared to answer    TELEPHONE CALL NOTE  Joshua [name] has been deemed a candidate for a follow-up tele-health visit to limit community exposure during the Covid-19 pandemic. I spoke with the patient via phone to ensure availability of  phone/video source, confirm preferred email & phone number, and discuss instructions and expectations.  I reminded Joshua [name] to be prepared with any vital sign and/or heart rhythm information that could potentially be obtained via home monitoring, at the time of his visit. I reminded Joshua [name] to expect a phone call prior to his visit.  Joshua [name] 01/10/2019 11:36 AM   INSTRUCTIONS FOR DOWNLOADING THE MYCHART APP TO SMARTPHONE  - The patient must first make sure to have activated MyChart and know their login information - If Apple,  go to [employer] and type in MyChart in the search bar and download the app. If Android, ask patient to go to [employer] and type in [place] in the search bar and download the app. The app is free but as with any other app downloads, their phone may require them to verify saved payment information or Apple/Android password.  - The patient will need to then log into the app with their MyChart username and password, and select [facility] as their healthcare provider to link the account. When it is time for your visit, go to the MyChart app, find appointments, and click Begin Video Visit. Be sure to Select Allow for your device to access the Microphone and Camera for your visit. You will then be connected, and your provider will be with you shortly.  **If they have any issues connecting, or need assistance please contact MyChart service desk [phone])**  **If using a computer, in order to ensure the best quality for their visit they will need to use either of the following Internet Browsers: [employer], or Google Chrome**  IF USING DOXIMITY or DOXY.ME - The patient will receive a link just prior to their visit by text.     FULL LENGTH CONSENT FOR TELE-HEALTH VISIT   I hereby voluntarily request, consent and authorize [facility] and its employed or contracted physicians, physician assistants, nurse practitioners or other licensed health care professionals (the Practitioner), to provide me with telemedicine health care services (the Services") as deemed necessary by the treating Practitioner. I acknowledge and consent to receive the Services by the Practitioner via telemedicine. I understand that the telemedicine visit will involve communicating with the Practitioner through live audiovisual communication technology and the disclosure of certain medical information by electronic transmission. I acknowledge that I have been given the opportunity to request an in-person  assessment or other available alternative prior to the telemedicine visit and am voluntarily participating in the telemedicine visit.  I understand that I have the right to withhold or withdraw my consent to the use of telemedicine in the course of my care at any time, without affecting my right to future care or treatment, and that the Practitioner or I may terminate the telemedicine visit at any time. I understand that I have the right to inspect all information obtained and/or recorded in the course of the telemedicine visit and may receive copies of available information for a reasonable fee.  I understand that some of the potential risks of receiving the Services via telemedicine include:   Delay or interruption in medical evaluation due to technological equipment failure or disruption;  Information transmitted may not be sufficient (e.g. poor resolution of images) to allow for appropriate medical decision making by the Practitioner; and/or   In rare instances, security protocols could fail, causing a breach of personal health information.  Furthermore, I acknowledge that it is my responsibility to provide information about my  medical history, conditions and care that is complete and accurate to the best of my ability. I acknowledge that Practitioner's advice, recommendations, and/or decision may be based on factors not within their control, such as incomplete or inaccurate data provided by me or distortions of diagnostic images or specimens that may result from electronic transmissions. I understand that the practice of medicine is not an exact science and that Practitioner makes no warranties or guarantees regarding treatment outcomes. I acknowledge that I will receive a copy of this consent concurrently upon execution via email to the email address I last provided but may also request a printed copy by calling the office of [facility].    I understand that my insurance will be billed for this  visit.   I have read or had this consent read to me.  I understand the contents of this consent, which adequately explains the benefits and risks of the Services being provided via telemedicine.   I have been provided ample opportunity to ask questions regarding this consent and the Services and have had my questions answered to my satisfaction.  I give my informed consent for the services to be provided through the use of telemedicine in my medical care  By participating in this telemedicine visit I agree to the above.

## 2019-01-14 NOTE — Progress Notes (Signed)
Virtual Visit via Video Note   This visit type was conducted due to national recommendations for restrictions regarding the COVID-19 Pandemic (e.g. social distancing) in an effort to limit this patient's exposure and mitigate transmission in our community.  Due to his co-morbid illnesses, this patient is at least at moderate risk for complications without adequate follow up.  This format is felt to be most appropriate for this patient at this time.  All issues noted in this document were discussed and addressed.  A limited physical exam was performed with this format.  Please refer to the patient's chart for his consent to telehealth for [name].   Evaluation Performed:  Follow-up visit  Date:  01/14/2019   ID:  Joshua [name], DOB 1932/07/13, MRN [id number]  Patient Location: Home Provider Location: Office  PCP:  Chipper Herb, MD  Cardiologist:  Sinclair Grooms, MD  Electrophysiologist:  None   Chief Complaint:  CAD  History of Present Illness:    Joshua [name] is a 85 y.o. male with szdisorder that followed intracerebral bleed from AV malformation,, prostate CA, bladder CAHTN, HLD, GERD, recent STEMI 05/10/2016 w/ DES toRCA complicated by Mobitz II-->HR improved and PPM not indicated.Subsequent pacemaker infection requiring device re-implantation 01/2018.  No chest pain, orthopnea, PND, edema, or DOE. Overall doing well. No NTG use.   The patient does not have symptoms concerning for COVID-19 infection (fever, chills, cough, or new shortness of breath).    Past Medical History:  Diagnosis Date   Arteriovenous malformation    caused intracerebrial bleed w seizure -- post craniotomy 1980   Arthritis    Bilateral lower extremity edema    Bladder tumor    CAD (coronary artery disease) cardiologist--  dr Daneen Schick   a. 04/27/2016 PCI with DES to RCA with 50% ostial to 60% segmental mid to distal left main, and 90% thrombus filled ostial to proximal circumflex,  with distal right coronary filled by collaterals from left-to-right   COPD with emphysema (HCC)    Dyspnea on exertion    ED (erectile dysfunction)    First degree AV block    GERD (gastroesophageal reflux disease)    History of adenomatous polyp of colon    09-22-2008   History of bladder cancer    s/p  resection bladder tumor 04-19-2016  non-invasive low-grade urothelial carcinoma   History of prostate cancer urologist-  dr Jeffie Pollock-- last PSA 0.02 (summer 2017)   dx 02/ 2013-- Stage T2a, Gleason 7, PSA 1.58--  s/p  radioactive prostate seed implants 01-19-2012   History of ST elevation myocardial infarction (STEMI)    04-27-2016   Hyperlipidemia    Hypertension    Presence of permanent cardiac pacemaker    S/P drug eluting coronary stent placement    04-27-2016    Second degree Mobitz I AV block    occasional w/ first degree heart block  per cardiologist note by dr Daneen Schick   Seizures [facility]) per pt son -- no seizure's since 1980   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  arteriorvenous cerebral malformation s/p  craniotomy 1980   Vitamin D deficiency    Past Surgical History:  Procedure Laterality Date   APPENDECTOMY  2011   BLEPHAROPLASTY Bilateral revision 02-17-2016   CARDIAC CATHETERIZATION N/A 04/27/2016   Procedure: Left Heart Cath and Coronary Angiography;  Surgeon: Belva Crome, MD;  Location: [facility];  Service: Cardiovascular;  Laterality: N/A;  acute inferior  wall STEMI ;  ostCx to midCx 90%,  ost1st Mrg to 1st Mrg 50%,  mid to distal RCA 100%,  pRCA 80%,  LVEF 45-50% w/ inferobasal akinesis   CARDIAC CATHETERIZATION N/A 04/27/2016   Procedure: Temporary Pacemaker;  Surgeon: Belva Crome, MD;  Location: [facility];  Service: Cardiovascular;  Laterality: N/A;  mobitz 2  second degree HB w/ heart rate 29bpm   CARDIAC CATHETERIZATION N/A 04/27/2016   Procedure: Coronary Stent Intervention;  Surgeon: Belva Crome, MD;  Location:  [facility];  Service: Cardiovascular;  Laterality: N/A; DES x1 to  Proximal RCA;  DES x1 to Mid RCA   CATARACT EXTRACTION W/ INTRAOCULAR LENS  IMPLANT, BILATERAL  2016  approx.   CRANIOTOMY  1980   intracranial bleed from arteriorvenous malformation (left parietal)   CYSTOSCOPY  01/19/2012   Procedure: CYSTOSCOPY;  Surgeon: Malka So, MD;  [facility]: [facility];  Service: Urology;;  no seeds found in bladder   CYSTOSCOPY W/ RETROGRADES Bilateral 04/19/2016   Procedure: CYSTOSCOPY WITH BILATERAL RETROGRADE PYELOGRAM TRANSURETHRAL RESECTION OF BLADDER TUMOR ;  Surgeon: [name], MD;  Location: [facility];  Service: Urology;  Laterality: Bilateral;   CYSTOSCOPY WITH BIOPSY N/A 11/10/2016   Procedure: CYSTOSCOPY WITH BIOPSY AND FULGURATION;  Surgeon: [name], MD;  Location: [facility];  Service: Urology;  Laterality: N/A;   INSERT / REPLACE / REMOVE PACEMAKER  06/15/2017   LEAD EXTRACTION N/A 01/22/2018   Procedure: LEAD EXTRACTION;  Surgeon: Evans Lance, MD;  Location: [facility];  Service: Cardiovascular;  Laterality: N/A;   PACEMAKER IMPLANT N/A 06/15/2017   Procedure: Pacemaker Implant;  Surgeon: [name], MD;  Location: [facility];  Service: Cardiovascular;  Laterality: N/A;   PACEMAKER IMPLANT N/A 01/25/2018   Procedure: PACEMAKER IMPLANT;  Surgeon: Evans Lance, MD;  Location: [facility];  Service: Cardiovascular;  Laterality: N/A;   PPM GENERATOR REMOVAL N/A 01/22/2018   Procedure: PPM GENERATOR REMOVAL;  Surgeon: Evans Lance, MD;  Location: [facility];  Service: Cardiovascular;  Laterality: N/A;   RADIOACTIVE SEED IMPLANT  01/19/2012   Procedure: RADIOACTIVE SEED IMPLANT;  Surgeon: Malka So, MD;  [facility];  Service: Urology;  Laterality: N/A;  87 seeds implanted   TRANSTHORACIC ECHOCARDIOGRAM  06/02/2016   ef 60-65%/  trivial MR/  mild TR     No outpatient medications  have been marked as taking for the 01/15/19 encounter (Appointment) with Belva Crome, MD.     Allergies:   Penicillins   Social History   Tobacco Use   Smoking status: Former Smoker    Packs/day: 2.00    Years: 15.00    Pack years: 30.00    Last attempt to quit: 01/15/1973    Years since quitting: 46.0   Smokeless tobacco: Never Used  Substance Use Topics   Alcohol use: Yes    Comment: occasionally   Drug use: No     Family Hx: The patient's family history includes Emphysema in his father and mother; Stroke in his mother.  ROS:   Please see the history of present illness.    Still goes into work each afternoon. 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 All other systems reviewed and are negative.   Prior CV studies:   The following studies were reviewed today:  No new data  Labs/Other Tests and Data Reviewed:  EKG:  An ECG dated 05/2018 was personally reviewed today and demonstrated:  AV sequential pacing.  Recent Labs: 04/19/2018: NT-Pro BNP 216 08/29/2018: ALT 18; BUN 20; Creatinine, Ser 0.91; Potassium 4.8; Sodium 142 10/11/2018: Hemoglobin 11.9; Platelets 324.0   Recent Lipid Panel Lab Results  Component Value Date/Time   CHOL 153 08/29/2018 11:07 AM   TRIG 56 08/29/2018 11:07 AM   TRIG 85 01/20/2016 09:58 AM   HDL 71 08/29/2018 11:07 AM   HDL 69 01/20/2016 09:58 AM   CHOLHDL 2.2 08/29/2018 11:07 AM   CHOLHDL 3.3 04/27/2016 11:33 AM   LDLCALC 71 08/29/2018 11:07 AM   LDLCALC 99 05/21/2014 11:01 AM    Wt Readings from Last 3 Encounters:  11/15/18 190 lb 6 oz (86.4 kg)  10/31/18 188 lb (85.3 kg)  10/11/18 188 lb 8 oz (85.5 kg)     Objective:    Vital Signs:  There were no vitals taken for this visit.   VITAL SIGNS:  reviewed  ASSESSMENT & PLAN:    1. CAD in native artery   2. Cardiac pacemaker in situ   3. Chronic diastolic heart failure ([place])   4. Essential hypertension   5. Hypercholesterolemia   6.  AV block, Mobitz 2    PLAN:  1. Healthy lifestyle and secondary prevention measures were discussed in detail. 2. Pacemaker function is normal when last evaluated most recently in January 2019. 3. No volume overload or evidence of heart failure based upon clinical data and history. 4. Blood pressure is generally in the range that we should except at his age.  140/80 mmHg. 5. Last LDL was around 70 earlier this year.  No changes in statin therapy.  Overall plan to follow-up in 6 to 9 months.  Earlier if angina.  Overall education and awareness concerning primary/secondary risk prevention was discussed in detail: LDL less than 70, hemoglobin A1c less than 7, blood pressure target less than 130/80 mmHg, >150 minutes of moderate aerobic activity per week, avoidance of smoking, weight control (via diet and exercise), and continued surveillance/management of/for obstructive sleep apnea.   COVID-19 Education: The signs and symptoms of COVID-19 were discussed with the patient and how to seek care for testing (follow up with PCP or arrange E-visit).  The importance of social distancing was discussed today.  Time:   Today, I have spent 18 minutes with the patient with telehealth technology discussing the above problems.     Medication Adjustments/Labs and Tests Ordered: Current medicines are reviewed at length with the patient today.  Concerns regarding medicines are outlined above.   Tests Ordered: No orders of the defined types were placed in this encounter.   Medication Changes: No orders of the defined types were placed in this encounter.   Disposition:  Follow up in 9 month(s)  Signed, Sinclair Grooms, MD  01/14/2019 5:56 PM    [place]

## 2019-01-15 ENCOUNTER — Other Ambulatory Visit: Payer: Self-pay

## 2019-01-15 ENCOUNTER — Telehealth (INDEPENDENT_AMBULATORY_CARE_PROVIDER_SITE_OTHER): Payer: Medicare Other | Admitting: Interventional Cardiology

## 2019-01-15 ENCOUNTER — Encounter: Payer: Self-pay | Admitting: Interventional Cardiology

## 2019-01-15 VITALS — BP 148/67 | HR 65 | Ht 70.0 in | Wt 185.0 lb

## 2019-01-15 DIAGNOSIS — I10 Essential (primary) hypertension: Secondary | ICD-10-CM

## 2019-01-15 DIAGNOSIS — I2510 Atherosclerotic heart disease of native coronary artery without angina pectoris: Secondary | ICD-10-CM | POA: Diagnosis not present

## 2019-01-15 DIAGNOSIS — Z950 Presence of cardiac pacemaker: Secondary | ICD-10-CM

## 2019-01-15 DIAGNOSIS — I5032 Chronic diastolic (congestive) heart failure: Secondary | ICD-10-CM

## 2019-01-15 DIAGNOSIS — E7800 Pure hypercholesterolemia, unspecified: Secondary | ICD-10-CM

## 2019-01-15 DIAGNOSIS — I441 Atrioventricular block, second degree: Secondary | ICD-10-CM

## 2019-01-15 NOTE — Patient Instructions (Signed)
Medication Instructions:  Your provider recommends that you continue on your current medications as directed. Please refer to the Current Medication list given to you today.    Follow-Up: Your provider wants you to follow-up in: 6 months with Dr. Tamala Julian. You will receive a reminder letter in the mail two months in advance. If you don't receive a letter, please call our office to schedule the follow-up appointment.

## 2019-01-31 ENCOUNTER — Ambulatory Visit (INDEPENDENT_AMBULATORY_CARE_PROVIDER_SITE_OTHER): Payer: Medicare Other | Admitting: Family Medicine

## 2019-01-31 ENCOUNTER — Other Ambulatory Visit: Payer: Self-pay

## 2019-01-31 ENCOUNTER — Encounter: Payer: Self-pay | Admitting: Family Medicine

## 2019-01-31 DIAGNOSIS — K219 Gastro-esophageal reflux disease without esophagitis: Secondary | ICD-10-CM

## 2019-01-31 DIAGNOSIS — E7800 Pure hypercholesterolemia, unspecified: Secondary | ICD-10-CM

## 2019-01-31 DIAGNOSIS — C61 Malignant neoplasm of prostate: Secondary | ICD-10-CM

## 2019-01-31 DIAGNOSIS — R531 Weakness: Secondary | ICD-10-CM | POA: Diagnosis not present

## 2019-01-31 DIAGNOSIS — E559 Vitamin D deficiency, unspecified: Secondary | ICD-10-CM | POA: Diagnosis not present

## 2019-01-31 DIAGNOSIS — K2900 Acute gastritis without bleeding: Secondary | ICD-10-CM | POA: Diagnosis not present

## 2019-01-31 DIAGNOSIS — H3530 Unspecified macular degeneration: Secondary | ICD-10-CM

## 2019-01-31 DIAGNOSIS — R569 Unspecified convulsions: Secondary | ICD-10-CM

## 2019-01-31 DIAGNOSIS — D692 Other nonthrombocytopenic purpura: Secondary | ICD-10-CM

## 2019-01-31 DIAGNOSIS — I10 Essential (primary) hypertension: Secondary | ICD-10-CM | POA: Diagnosis not present

## 2019-01-31 DIAGNOSIS — C672 Malignant neoplasm of lateral wall of bladder: Secondary | ICD-10-CM

## 2019-01-31 DIAGNOSIS — I2510 Atherosclerotic heart disease of native coronary artery without angina pectoris: Secondary | ICD-10-CM

## 2019-01-31 NOTE — Patient Instructions (Signed)
Please come to the office for blood work.  Call the nurse and she will take what is the best time to come to get this done. Keep in touch with rehab and I will let you know when it is the safest time for you to return to rehab to help strengthen your lower extremities Continue to walk as much as possible and especially at work and avoid contact with other people other than those close to you like your house that are your son and Eddie Dibbles. Drink plenty of water and fluids Follow-up with cardiology urology ophthalmology dermatology and gastroenterology as planned

## 2019-01-31 NOTE — Progress Notes (Signed)
Virtual Visit Via telephone Note I connected with@ on 01/31/19 by telephone and verified that I am speaking with the correct person or authorized healthcare agent using two identifiers. Joshua [name] is currently located at home and there are no unauthorized people in close proximity. I completed this visit while in a private location in my home office.  This visit type was conducted due to national recommendations for restrictions regarding the COVID-19 Pandemic (e.g. social distancing).  This format is felt to be most appropriate for this patient at this time.  All issues noted in this document were discussed and addressed.  No physical exam was performed.    I discussed the limitations, risks, security and privacy concerns of performing an evaluation and management service by telephone and the availability of in person appointments. I also discussed with the patient that there may be a patient responsible charge related to this service. The patient expressed understanding and agreed to proceed.   Date:  01/31/2019    ID:  Joshua [name]      Oct 12, 1931        [id number]   Patient Care Team Patient Care Team: Chipper Herb, MD as PCP - General (Family Medicine) Belva Crome, MD as PCP - Cardiology (Cardiology) Gatha Mayer, MD as Consulting Physician (Gastroenterology) Gerarda Fraction, MD as Referring Physician (Ophthalmology) [name], MD as Attending Physician (Urology)  Reason for Visit: Primary Care Follow-up     History of Present Illness & Review of Systems:     Joshua [name] is a 85 y.o. year old male primary care patient that presents today for a telehealth visit.  The patient is 85 years old and is doing well overall.  He is keeping his appointments with cardiology urology ophthalmology dermatology and gastroenterology.  He did have an endoscopy and colonoscopy in February because of dark stools and a biopsy was done and he did did not ever get the final results  of that biopsy report and he will call with the gastroenterologist nurse and see if he can get that information.  He is still having dark stools but he is feeling well and this is mostly secondary to taking his iron.  He denies any chest pain or shortness of breath.  He denies any trouble with his stomach currently other than the dark stools and he understands that the iron is probably playing a role with this.  He is passing his water well but does see the urologist regularly.  He has not restarted physical therapy but feels he needs to do something to strengthen his legs and will not go back to rehab therapy until he knows that it is safe to go there.  Review of systems as stated, otherwise negative.  The patient does not have symptoms concerning for COVID-19 infection (fever, chills, cough, or new shortness of breath).      Current Medications (Verified) Allergies as of 01/31/2019      Reactions   Penicillins Rash   Has patient had a PCN reaction causing immediate rash, facial/tongue/throat swelling, SOB or lightheadedness with hypotension: ##Yes## Has patient had a PCN reaction causing severe rash involving mucus membranes or skin necrosis: No Has patient had a PCN reaction that required hospitalization No Has patient had a PCN reaction occurring within the last 10 years: No If all of the above answers are "NO", then may proceed with Cephalosporin use.      Medication List  Accurate as of Jan 31, 2019 11:12 AM. If you have any questions, ask your nurse or doctor.        atorvastatin 20 MG tablet Commonly known as:  LIPITOR TAKE 1 TABLET BY MOUTH DAILY   calcium carbonate 1500 (600 Ca) MG Tabs tablet Commonly known as:  OSCAL Take 600 mg of elemental calcium by mouth daily with breakfast.   citalopram 20 MG tablet Commonly known as:  CELEXA Take 1 tablet (20 mg total) by mouth daily.   clopidogrel 75 MG tablet Commonly known as:  PLAVIX Take 1 tablet (75 mg total) by  mouth daily.   ferrous sulfate 325 (65 FE) MG tablet Take 1 tablet (325 mg total) by mouth daily with breakfast.   MAGNESIUM PO Take 1 tablet by mouth at bedtime.   PHENobarbital 64.8 MG tablet Commonly known as:  LUMINAL Take 1 tablet (64.8 mg total) by mouth 2 (two) times daily.   phenytoin 100 MG ER capsule Commonly known as:  DILANTIN Take 2 capsules (200 mg total) by mouth 2 (two) times daily.   polyvinyl alcohol 1.4 % ophthalmic solution Commonly known as:  LIQUIFILM TEARS 1 drop as needed.   PreserVision AREDS 2 Caps Take 1 capsule by mouth 2 (two) times daily.   ranitidine 75 MG tablet Commonly known as:  ZANTAC Take 75 mg by mouth 2 (two) times daily.   spironolactone 25 MG tablet Commonly known as:  ALDACTONE Take 1 tablet (25 mg total) by mouth daily.   Vitamin D3 25 MCG (1000 UT) Caps Take 1 capsule by mouth daily.           Allergies (Verified)    Penicillins  Past Medical History Past Medical History:  Diagnosis Date   Arteriovenous malformation    caused intracerebrial bleed w seizure -- post craniotomy 1980   Arthritis    Bilateral lower extremity edema    Bladder tumor    CAD (coronary artery disease) cardiologist--  dr Daneen Schick   a. 04/27/2016 PCI with DES to RCA with 50% ostial to 60% segmental mid to distal left main, and 90% thrombus filled ostial to proximal circumflex, with distal right coronary filled by collaterals from left-to-right   COPD with emphysema (HCC)    Dyspnea on exertion    ED (erectile dysfunction)    First degree AV block    GERD (gastroesophageal reflux disease)    History of adenomatous polyp of colon    09-22-2008   History of bladder cancer    s/p  resection bladder tumor 04-19-2016  non-invasive low-grade urothelial carcinoma   History of prostate cancer urologist-  dr Jeffie Pollock-- last PSA 0.02 (summer 2017)   dx 02/ 2013-- Stage T2a, Gleason 7, PSA 1.58--  s/p  radioactive prostate seed implants  01-19-2012   History of ST elevation myocardial infarction (STEMI)    04-27-2016   Hyperlipidemia    Hypertension    Presence of permanent cardiac pacemaker    S/P drug eluting coronary stent placement    04-27-2016    Second degree Mobitz I AV block    occasional w/ first degree heart block  per cardiologist note by dr Daneen Schick   Seizures [facility]) per pt son -- no seizure's since 1980   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  arteriorvenous cerebral malformation s/p  craniotomy 1980   Vitamin D deficiency      Past Surgical History:  Procedure Laterality Date   APPENDECTOMY  2011  BLEPHAROPLASTY Bilateral revision 02-17-2016   CARDIAC CATHETERIZATION N/A 04/27/2016   Procedure: Left Heart Cath and Coronary Angiography;  Surgeon: Belva Crome, MD;  Location: [place] [facility];  Service: Cardiovascular;  Laterality: N/A;  acute inferior wall STEMI ;  ostCx to midCx 90%,  ost1st Mrg to 1st Mrg 50%,  mid to distal RCA 100%,  pRCA 80%,  LVEF 45-50% w/ inferobasal akinesis   CARDIAC CATHETERIZATION N/A 04/27/2016   Procedure: Temporary Pacemaker;  Surgeon: Belva Crome, MD;  Location: [place] [facility];  Service: Cardiovascular;  Laterality: N/A;  mobitz 2  second degree HB w/ heart rate 29bpm   CARDIAC CATHETERIZATION N/A 04/27/2016   Procedure: Coronary Stent Intervention;  Surgeon: Belva Crome, MD;  Location: [place] [facility];  Service: Cardiovascular;  Laterality: N/A; DES x1 to  Proximal RCA;  DES x1 to Mid RCA   CATARACT EXTRACTION W/ INTRAOCULAR LENS  IMPLANT, BILATERAL  2016  approx.   CRANIOTOMY  1980   intracranial bleed from arteriorvenous malformation (left parietal)   CYSTOSCOPY  01/19/2012   Procedure: CYSTOSCOPY;  Surgeon: Malka So, MD;  Location: [facility];  Service: Urology;;  no seeds found in bladder   CYSTOSCOPY W/ RETROGRADES Bilateral 04/19/2016   Procedure: CYSTOSCOPY WITH BILATERAL RETROGRADE PYELOGRAM TRANSURETHRAL  RESECTION OF BLADDER TUMOR ;  Surgeon: [name], MD;  Location: [facility];  Service: Urology;  Laterality: Bilateral;   CYSTOSCOPY WITH BIOPSY N/A 11/10/2016   Procedure: CYSTOSCOPY WITH BIOPSY AND FULGURATION;  Surgeon: [name], MD;  Location: [facility];  Service: Urology;  Laterality: N/A;   INSERT / REPLACE / REMOVE PACEMAKER  06/15/2017   LEAD EXTRACTION N/A 01/22/2018   Procedure: LEAD EXTRACTION;  Surgeon: Evans Lance, MD;  Location: [place] [facility];  Service: Cardiovascular;  Laterality: N/A;   PACEMAKER IMPLANT N/A 06/15/2017   Procedure: Pacemaker Implant;  Surgeon: [name], MD;  Location: [place] [facility];  Service: Cardiovascular;  Laterality: N/A;   PACEMAKER IMPLANT N/A 01/25/2018   Procedure: PACEMAKER IMPLANT;  Surgeon: Evans Lance, MD;  Location: [place] [facility];  Service: Cardiovascular;  Laterality: N/A;   PPM GENERATOR REMOVAL N/A 01/22/2018   Procedure: PPM GENERATOR REMOVAL;  Surgeon: Evans Lance, MD;  Location: [place] [facility];  Service: Cardiovascular;  Laterality: N/A;   RADIOACTIVE SEED IMPLANT  01/19/2012   Procedure: RADIOACTIVE SEED IMPLANT;  Surgeon: Malka So, MD;  Location[facility];  Service: Urology;  Laterality: N/A;  34 seeds implanted   TRANSTHORACIC ECHOCARDIOGRAM  06/02/2016   ef 60-65%/  trivial MR/  mild TR    Social History   Socioeconomic History   Marital status: Widowed    Spouse name: Not on file   Number of children: 2   Years of education: 1 year of college   Highest education level: Not on file  Occupational History   Occupation: Retired  Scientist, product/process development strain: Not hard at [employer] insecurity:    Worry: Never true    Inability: Never true   Transportation needs:    Medical: No    Non-medical: No  Tobacco Use   Smoking status: Former Smoker    Packs/day: 2.00    Years: 15.00    Pack years: 30.00    Last attempt to quit: 01/15/1973     Years since quitting: 46.0   Smokeless tobacco: Never Used  Substance  and Sexual Activity   Alcohol use: Yes    Comment: occasionally   Drug use: No   Sexual activity: Not Currently  Lifestyle   Physical activity:    Days per week: 3 days    Minutes per session: 50 min   Stress: Not at all  Relationships   Social connections:    Talks on phone: More than three times a week    Gets together: More than three times a week    Attends religious service: More than 4 times per year    Active member of club or organization: Yes    Attends meetings of clubs or organizations: More than 4 times per year    Relationship status: Widowed  Other Topics Concern   Not on file  Social History Narrative   Martin Majestic to [facility] [employer] after MI for rehab.     Family History  Problem Relation Age of Onset   Emphysema Mother        age 30 deceased   Stroke Mother    Emphysema Father        deceased age 52      Labs/Other Tests and Data Reviewed:    Wt Readings from Last 3 Encounters:  01/15/19 185 lb (83.9 kg)  11/15/18 190 lb 6 oz (86.4 kg)  10/31/18 188 lb (85.3 kg)   Temp Readings from Last 3 Encounters:  10/31/18 (!) 96.9 F (36.1 C)  10/02/18 (!) 97.2 F (36.2 C) (Oral)  05/10/18 (!) 97 F (36.1 C) (Oral)   BP Readings from Last 3 Encounters:  01/15/19 (!) 148/67  11/15/18 (!) 147/79  10/31/18 133/62   Pulse Readings from Last 3 Encounters:  01/15/19 65  11/15/18 73  10/31/18 60     Lab Results  Component Value Date   HGBA1C 5.0 06/02/2016   HGBA1C 5.5 04/27/2016   HGBA1C 5.3% 11/25/2013   Lab Results  Component Value Date   LDLCALC 71 08/29/2018   CREATININE 0.91 08/29/2018       Chemistry      Component Value Date/Time   NA 142 08/29/2018 1107   K 4.8 08/29/2018 1107   CL 105 08/29/2018 1107   CO2 22 08/29/2018 1107   BUN 20 08/29/2018 1107   CREATININE 0.91 08/29/2018 1107   CREATININE 0.94 05/16/2016 1602      Component Value Date/Time     CALCIUM 9.7 08/29/2018 1107   ALKPHOS 144 (H) 08/29/2018 1107   AST 27 08/29/2018 1107   ALT 18 08/29/2018 1107   BILITOT 0.5 08/29/2018 1107         OBSERVATIONS/ OBJECTIVE:     The patient is doing well I spoke to him the entire time on the phone regarding his follow-up appointments with specialist and his social distancing and personal protective equipment.  Today he denies any chest pain or shortness of breath.  His GI condition is stable and he is passing his water as well as possible.  He just saw Dr. Tamala Julian and had a visual visit with him and everything was good and that is his cardiologist.  He is awaiting appointments to return to see the urologist.  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  He has someone who stays with him during the day when he is at home and he wears a monitor in case he falls and is fully aware of how to get in  touch with anyone if he has any problems.  I did call rehab therapy and they are not taking any of the exercise patient is back at this point in time but they will call the patient when they start taking patient's back for therapy again.  He understands to call the office and make arrangements to come for his appointment for lab work.  Physical exam deferred due to nature of telephonic visit.  ASSESSMENT & PLAN    Time:   Today, I have spent 33 minutes with the patient via telephone discussing the above including Covid precautions.     Visit Diagnoses: 1. Essential hypertension -Continue current medicines as recommended by cardiologist and continue follow-up with cardiology  2. Vitamin D deficiency -Continue vitamin D replacement  3. Seizures ([place]) -Continue Dilantin and phenobarbital and check blood levels when blood work is drawn  4. Hypercholesterolemia -Continue aggressive therapeutic lifestyle changes and atorvastatin pending results of lab work  5.  Gastroesophageal reflux disease, esophagitis presence not specified -Continue with Pepcid AC 40 mg daily  6. Prostate cancer ([place]) -Continue to follow-up with urology and Dr. Jeffie Pollock as planned  7. Senile purpura (HCC) -No increased bleeding or bruising noted by patient  8. Malignant neoplasm of lateral wall of urinary bladder ([place]) -Follow-up with urology as planned  9. Macular degeneration of both eyes, unspecified type -Follow-up with Dr. Trixie Deis as planned for injections  10. Acute gastritis, presence of bleeding unspecified, unspecified gastritis type -Stay on Pepcid [facility], call Dr. Celesta Aver office to check on biopsy results from procedure in February  11. General weakness -Continue walking therapy as much as possible and reinitiate rehab therapy when it is safe pernicious anemia  Patient Instructions  Please come to the office for blood work.  Call the nurse and she will take what is the best time to come to get this done. Keep in touch with rehab and I will let you know when it is the safest time for you to return to rehab to help strengthen your lower extremities Continue to walk as much as possible and especially at work and avoid contact with other people other than those close to you like your house that are your son and Eddie Dibbles. Drink plenty of water and fluids Follow-up with cardiology urology ophthalmology dermatology and gastroenterology as planned      The above assessment and management plan was discussed with the patient. The patient verbalized understanding of and has agreed to the management plan. Patient is aware to call the clinic if symptoms persist or worsen. Patient is aware when to return to the clinic for a follow-up visit. Patient educated on when it is appropriate to go to the emergency department.    Chipper Herb, MD [address], [place], [place] [address] Ph [phone]   Arrie Senate MD

## 2019-01-31 NOTE — Addendum Note (Signed)
Addended by: Zannie Cove on: 01/31/2019 01:35 PM   Modules accepted: Orders

## 2019-02-04 ENCOUNTER — Telehealth: Payer: Self-pay | Admitting: Internal Medicine

## 2019-02-04 NOTE — Telephone Encounter (Signed)
Pt son called in wanted to discuss pt path repoort from procd 10/31/2018

## 2019-02-04 NOTE — Telephone Encounter (Signed)
The pt son was given the information in the letter that was mailed in Feb of this year.  He thanked me for calling and had no further questions.

## 2019-02-19 ENCOUNTER — Telehealth: Payer: Self-pay | Admitting: Family Medicine

## 2019-02-19 NOTE — Telephone Encounter (Signed)
Pt aware to come in for labs

## 2019-02-21 DIAGNOSIS — H353231 Exudative age-related macular degeneration, bilateral, with active choroidal neovascularization: Secondary | ICD-10-CM | POA: Diagnosis not present

## 2019-02-22 ENCOUNTER — Other Ambulatory Visit: Payer: Medicare Other

## 2019-02-22 ENCOUNTER — Other Ambulatory Visit: Payer: Self-pay

## 2019-02-22 DIAGNOSIS — C672 Malignant neoplasm of lateral wall of bladder: Secondary | ICD-10-CM | POA: Diagnosis not present

## 2019-02-22 DIAGNOSIS — E559 Vitamin D deficiency, unspecified: Secondary | ICD-10-CM | POA: Diagnosis not present

## 2019-02-22 DIAGNOSIS — K219 Gastro-esophageal reflux disease without esophagitis: Secondary | ICD-10-CM

## 2019-02-22 DIAGNOSIS — H3530 Unspecified macular degeneration: Secondary | ICD-10-CM | POA: Diagnosis not present

## 2019-02-22 DIAGNOSIS — C61 Malignant neoplasm of prostate: Secondary | ICD-10-CM

## 2019-02-22 DIAGNOSIS — E7800 Pure hypercholesterolemia, unspecified: Secondary | ICD-10-CM

## 2019-02-22 DIAGNOSIS — K2900 Acute gastritis without bleeding: Secondary | ICD-10-CM | POA: Diagnosis not present

## 2019-02-22 DIAGNOSIS — R531 Weakness: Secondary | ICD-10-CM

## 2019-02-22 DIAGNOSIS — R569 Unspecified convulsions: Secondary | ICD-10-CM | POA: Diagnosis not present

## 2019-02-22 DIAGNOSIS — I10 Essential (primary) hypertension: Secondary | ICD-10-CM | POA: Diagnosis not present

## 2019-02-22 DIAGNOSIS — D692 Other nonthrombocytopenic purpura: Secondary | ICD-10-CM

## 2019-02-22 LAB — LIPID PANEL

## 2019-02-25 LAB — CBC WITH DIFFERENTIAL/PLATELET
Basophils Absolute: 0.2 10*3/uL (ref 0.0–0.2)
Basos: 2 %
EOS (ABSOLUTE): 1.1 10*3/uL — ABNORMAL HIGH (ref 0.0–0.4)
Eos: 10 %
Hematocrit: 34.4 % — ABNORMAL LOW (ref 37.5–51.0)
Hemoglobin: 12 g/dL — ABNORMAL LOW (ref 13.0–17.7)
Immature Grans (Abs): 0.1 10*3/uL (ref 0.0–0.1)
Immature Granulocytes: 1 %
Lymphocytes Absolute: 3 10*3/uL (ref 0.7–3.1)
Lymphs: 28 %
MCH: 31.1 pg (ref 26.6–33.0)
MCHC: 34.9 g/dL (ref 31.5–35.7)
MCV: 89 fL (ref 79–97)
Monocytes Absolute: 1.1 10*3/uL — ABNORMAL HIGH (ref 0.1–0.9)
Monocytes: 10 %
Neutrophils Absolute: 5.2 10*3/uL (ref 1.4–7.0)
Neutrophils: 49 %
Platelets: 310 10*3/uL (ref 150–450)
RBC: 3.86 x10E6/uL — ABNORMAL LOW (ref 4.14–5.80)
RDW: 14.6 % (ref 11.6–15.4)
WBC: 10.5 10*3/uL (ref 3.4–10.8)

## 2019-02-25 LAB — VITAMIN D 25 HYDROXY (VIT D DEFICIENCY, FRACTURES): Vit D, 25-Hydroxy: 28.6 ng/mL — ABNORMAL LOW (ref 30.0–100.0)

## 2019-02-25 LAB — BMP8+EGFR
BUN/Creatinine Ratio: 18 (ref 10–24)
BUN: 17 mg/dL (ref 8–27)
CO2: 23 mmol/L (ref 20–29)
Calcium: 9.5 mg/dL (ref 8.6–10.2)
Chloride: 106 mmol/L (ref 96–106)
Creatinine, Ser: 0.96 mg/dL (ref 0.76–1.27)
GFR calc Af Amer: 84 mL/min/{1.73_m2} (ref >59)
GFR calc non Af Amer: 72 mL/min/{1.73_m2} (ref >59)
Glucose: 103 mg/dL — ABNORMAL HIGH (ref 65–99)
Potassium: 4.2 mmol/L (ref 3.5–5.2)
Sodium: 144 mmol/L (ref 134–144)

## 2019-02-25 LAB — HEPATIC FUNCTION PANEL
ALT: 21 IU/L (ref 0–44)
AST: 27 IU/L (ref 0–40)
Albumin: 4.4 g/dL (ref 3.6–4.6)
Alkaline Phosphatase: 155 IU/L — ABNORMAL HIGH (ref 39–117)
Bilirubin Total: 0.6 mg/dL (ref 0.0–1.2)
Bilirubin, Direct: 0.18 mg/dL (ref 0.00–0.40)
Total Protein: 7 g/dL (ref 6.0–8.5)

## 2019-02-25 LAB — LIPID PANEL
Chol/HDL Ratio: 2 ratio (ref 0.0–5.0)
Cholesterol, Total: 157 mg/dL (ref 100–199)
HDL: 79 mg/dL (ref >39)
LDL Calculated: 65 mg/dL (ref 0–99)
Triglycerides: 65 mg/dL (ref 0–149)
VLDL Cholesterol Cal: 13 mg/dL (ref 5–40)

## 2019-02-25 LAB — IRON: Iron: 226 ug/dL — ABNORMAL HIGH (ref 38–169)

## 2019-02-25 LAB — PHENOBARBITAL LEVEL: Phenobarbital, Serum: 26 ug/mL (ref 15–40)

## 2019-02-25 LAB — PHENYTOIN LEVEL, FREE: Phenytoin, Free: 0.8 ug/mL — ABNORMAL LOW (ref 1.0–2.0)

## 2019-03-07 DIAGNOSIS — H353231 Exudative age-related macular degeneration, bilateral, with active choroidal neovascularization: Secondary | ICD-10-CM | POA: Diagnosis not present

## 2019-03-11 ENCOUNTER — Ambulatory Visit (INDEPENDENT_AMBULATORY_CARE_PROVIDER_SITE_OTHER): Payer: Medicare Other | Admitting: *Deleted

## 2019-03-11 DIAGNOSIS — I441 Atrioventricular block, second degree: Secondary | ICD-10-CM

## 2019-03-12 ENCOUNTER — Telehealth: Payer: Self-pay

## 2019-03-12 NOTE — Telephone Encounter (Signed)
Left message for patient to remind of missed remote transmission.  

## 2019-03-13 LAB — CUP PACEART REMOTE DEVICE CHECK
Battery Remaining Longevity: 114 mo
Battery Voltage: 3.02 V
Brady Statistic AP VP Percent: 46.03 %
Brady Statistic AP VS Percent: 0.04 %
Brady Statistic AS VP Percent: 52.89 %
Brady Statistic AS VS Percent: 1.04 %
Brady Statistic RA Percent Paced: 46.12 %
Brady Statistic RV Percent Paced: 98.92 %
Date Time Interrogation Session: 20200624164754
Implantable Lead Implant Date: 20190509
Implantable Lead Implant Date: 20190509
Implantable Lead Location: 753859
Implantable Lead Location: 753860
Implantable Lead Model: 3830
Implantable Lead Model: 5076
Implantable Pulse Generator Implant Date: 20190509
Lead Channel Impedance Value: 266 Ohm
Lead Channel Impedance Value: 380 Ohm
Lead Channel Impedance Value: 513 Ohm
Lead Channel Impedance Value: 608 Ohm
Lead Channel Pacing Threshold Amplitude: 1.125 V
Lead Channel Pacing Threshold Amplitude: 1.25 V
Lead Channel Pacing Threshold Pulse Width: 0.4 ms
Lead Channel Pacing Threshold Pulse Width: 0.4 ms
Lead Channel Sensing Intrinsic Amplitude: 1.25 mV
Lead Channel Sensing Intrinsic Amplitude: 1.25 mV
Lead Channel Sensing Intrinsic Amplitude: 2.375 mV
Lead Channel Sensing Intrinsic Amplitude: 2.375 mV
Lead Channel Setting Pacing Amplitude: 2.25 V
Lead Channel Setting Pacing Amplitude: 2.75 V
Lead Channel Setting Pacing Pulse Width: 0.4 ms
Lead Channel Setting Sensing Sensitivity: 0.9 mV

## 2019-03-21 NOTE — Progress Notes (Signed)
Remote pacemaker transmission.   

## 2019-03-28 DIAGNOSIS — H353231 Exudative age-related macular degeneration, bilateral, with active choroidal neovascularization: Secondary | ICD-10-CM | POA: Diagnosis not present

## 2019-04-19 ENCOUNTER — Other Ambulatory Visit: Payer: Self-pay | Admitting: *Deleted

## 2019-04-19 ENCOUNTER — Other Ambulatory Visit: Payer: Self-pay

## 2019-04-19 MED ORDER — PHENOBARBITAL 64.8 MG PO TABS: 64.8000 mg | ORAL_TABLET | Freq: Two times a day (BID) | ORAL | 0 refills | Status: DC

## 2019-04-19 MED ORDER — PHENYTOIN SODIUM EXTENDED 100 MG PO CAPS: 200.0000 mg | ORAL_CAPSULE | Freq: Two times a day (BID) | ORAL | 0 refills | Status: DC

## 2019-04-19 NOTE — Addendum Note (Signed)
Addended by: Antonietta Barcelona D on: 04/19/2019 11:44 AM   Modules accepted: Orders

## 2019-04-25 ENCOUNTER — Other Ambulatory Visit: Payer: Self-pay | Admitting: Family Medicine

## 2019-04-25 NOTE — Telephone Encounter (Signed)
Patient aware medication was sent to [facility] on 04/19/2019

## 2019-05-02 DIAGNOSIS — H353231 Exudative age-related macular degeneration, bilateral, with active choroidal neovascularization: Secondary | ICD-10-CM | POA: Diagnosis not present

## 2019-05-23 DIAGNOSIS — L570 Actinic keratosis: Secondary | ICD-10-CM | POA: Diagnosis not present

## 2019-05-23 DIAGNOSIS — L820 Inflamed seborrheic keratosis: Secondary | ICD-10-CM | POA: Diagnosis not present

## 2019-05-23 DIAGNOSIS — Z85828 Personal history of other malignant neoplasm of skin: Secondary | ICD-10-CM | POA: Diagnosis not present

## 2019-05-23 DIAGNOSIS — L821 Other seborrheic keratosis: Secondary | ICD-10-CM | POA: Diagnosis not present

## 2019-06-05 DIAGNOSIS — Z8551 Personal history of malignant neoplasm of bladder: Secondary | ICD-10-CM | POA: Diagnosis not present

## 2019-06-05 DIAGNOSIS — Z8546 Personal history of malignant neoplasm of prostate: Secondary | ICD-10-CM | POA: Diagnosis not present

## 2019-06-11 ENCOUNTER — Ambulatory Visit (INDEPENDENT_AMBULATORY_CARE_PROVIDER_SITE_OTHER): Payer: Medicare Other | Admitting: *Deleted

## 2019-06-11 DIAGNOSIS — I441 Atrioventricular block, second degree: Secondary | ICD-10-CM

## 2019-06-11 LAB — CUP PACEART REMOTE DEVICE CHECK
Battery Remaining Longevity: 111 mo
Battery Voltage: 3.02 V
Brady Statistic AP VP Percent: 48.38 %
Brady Statistic AP VS Percent: 0.02 %
Brady Statistic AS VP Percent: 51.06 %
Brady Statistic AS VS Percent: 0.54 %
Brady Statistic RA Percent Paced: 48.42 %
Brady Statistic RV Percent Paced: 99.44 %
Date Time Interrogation Session: 20200922175748
Implantable Lead Implant Date: 20190509
Implantable Lead Implant Date: 20190509
Implantable Lead Location: 753859
Implantable Lead Location: 753860
Implantable Lead Model: 3830
Implantable Lead Model: 5076
Implantable Pulse Generator Implant Date: 20190509
Lead Channel Impedance Value: 266 Ohm
Lead Channel Impedance Value: 361 Ohm
Lead Channel Impedance Value: 494 Ohm
Lead Channel Impedance Value: 513 Ohm
Lead Channel Pacing Threshold Amplitude: 1 V
Lead Channel Pacing Threshold Amplitude: 1.25 V
Lead Channel Pacing Threshold Pulse Width: 0.4 ms
Lead Channel Pacing Threshold Pulse Width: 0.4 ms
Lead Channel Sensing Intrinsic Amplitude: 0.875 mV
Lead Channel Sensing Intrinsic Amplitude: 0.875 mV
Lead Channel Sensing Intrinsic Amplitude: 3.125 mV
Lead Channel Sensing Intrinsic Amplitude: 3.125 mV
Lead Channel Setting Pacing Amplitude: 2 V
Lead Channel Setting Pacing Amplitude: 2.75 V
Lead Channel Setting Pacing Pulse Width: 0.4 ms
Lead Channel Setting Sensing Sensitivity: 0.9 mV

## 2019-06-21 NOTE — Progress Notes (Signed)
Remote pacemaker transmission.   

## 2019-06-27 DIAGNOSIS — H353231 Exudative age-related macular degeneration, bilateral, with active choroidal neovascularization: Secondary | ICD-10-CM | POA: Diagnosis not present

## 2019-07-01 ENCOUNTER — Other Ambulatory Visit: Payer: Self-pay | Admitting: *Deleted

## 2019-07-01 ENCOUNTER — Ambulatory Visit (INDEPENDENT_AMBULATORY_CARE_PROVIDER_SITE_OTHER): Payer: Medicare Other

## 2019-07-01 ENCOUNTER — Other Ambulatory Visit: Payer: Self-pay

## 2019-07-01 DIAGNOSIS — Z23 Encounter for immunization: Secondary | ICD-10-CM | POA: Diagnosis not present

## 2019-07-01 MED ORDER — CITALOPRAM HYDROBROMIDE 20 MG PO TABS: 20.0000 mg | ORAL_TABLET | Freq: Every day | ORAL | 3 refills | Status: DC

## 2019-07-01 MED ORDER — CLOPIDOGREL BISULFATE 75 MG PO TABS: 75.0000 mg | ORAL_TABLET | Freq: Every day | ORAL | 3 refills | Status: DC

## 2019-07-01 MED ORDER — PHENOBARBITAL 64.8 MG PO TABS: 64.8000 mg | ORAL_TABLET | Freq: Two times a day (BID) | ORAL | 1 refills | Status: DC

## 2019-07-01 MED ORDER — ATORVASTATIN CALCIUM 20 MG PO TABS: 20.0000 mg | ORAL_TABLET | Freq: Every day | ORAL | 3 refills | Status: DC

## 2019-07-01 MED ORDER — SPIRONOLACTONE 25 MG PO TABS: 25.0000 mg | ORAL_TABLET | Freq: Every day | ORAL | 3 refills | Status: DC

## 2019-07-01 NOTE — Telephone Encounter (Signed)
[name] needs asap to mail order = only has a few left  Rakes appt 07/19/19. (Joshua [name])

## 2019-07-03 ENCOUNTER — Other Ambulatory Visit: Payer: Self-pay | Admitting: *Deleted

## 2019-07-03 MED ORDER — PHENOBARBITAL 64.8 MG PO TABS: 64.8000 mg | ORAL_TABLET | Freq: Two times a day (BID) | ORAL | 1 refills | Status: DC

## 2019-07-11 ENCOUNTER — Other Ambulatory Visit: Payer: Self-pay

## 2019-07-12 ENCOUNTER — Ambulatory Visit: Payer: Medicare Other

## 2019-07-18 ENCOUNTER — Other Ambulatory Visit: Payer: Self-pay

## 2019-07-19 ENCOUNTER — Encounter: Payer: Self-pay | Admitting: Family Medicine

## 2019-07-19 ENCOUNTER — Ambulatory Visit (INDEPENDENT_AMBULATORY_CARE_PROVIDER_SITE_OTHER): Payer: Medicare Other | Admitting: Family Medicine

## 2019-07-19 VITALS — BP 139/66 | HR 66 | Temp 97.1°F | Resp 20 | Ht 70.0 in | Wt 187.0 lb

## 2019-07-19 DIAGNOSIS — E559 Vitamin D deficiency, unspecified: Secondary | ICD-10-CM | POA: Diagnosis not present

## 2019-07-19 DIAGNOSIS — I10 Essential (primary) hypertension: Secondary | ICD-10-CM | POA: Diagnosis not present

## 2019-07-19 DIAGNOSIS — R569 Unspecified convulsions: Secondary | ICD-10-CM

## 2019-07-19 DIAGNOSIS — Z79899 Other long term (current) drug therapy: Secondary | ICD-10-CM | POA: Insufficient documentation

## 2019-07-19 DIAGNOSIS — K219 Gastro-esophageal reflux disease without esophagitis: Secondary | ICD-10-CM | POA: Diagnosis not present

## 2019-07-19 DIAGNOSIS — E7800 Pure hypercholesterolemia, unspecified: Secondary | ICD-10-CM

## 2019-07-19 NOTE — Progress Notes (Signed)
Subjective:  Patient ID: Joshua [name], male    DOB: 11/09/1931, 85 y.o.   MRN: [id number]  Patient Care Team: Baruch Gouty, FNP as PCP - General (Family Medicine) Belva Crome, MD as PCP - Cardiology (Cardiology) Gatha Mayer, MD as Consulting Physician (Gastroenterology) Gerarda Fraction, MD as Referring Physician (Ophthalmology) [name], MD as Attending Physician (Urology)   Chief Complaint:  Medical Management of Chronic Issues (Patient of Dr Laurance Flatten)   HPI: Joshua [name] is a 85 y.o. male presenting on 07/19/2019 for Medical Management of Chronic Issues (Patient of Dr Laurance Flatten)   1. Essential hypertension Complaint with meds - Yes Current Medications - spironolactone Checking BP at home ranging 120/80 Exercising Regularly - No Watching Salt intake - Yes Pertinent ROS:  Headache - No Fatigue - No Visual Disturbances - No Chest pain - No Dyspnea - No Palpitations - No LE edema - No They report good compliance with medications and can restate their regimen by memory. No medication side effects.  Family, social, and smoking history reviewed.   BP Readings from Last 3 Encounters:  07/19/19 139/66  01/15/19 (!) 148/67  11/15/18 (!) 147/79   CMP Latest Ref Rng & Units 02/22/2019 08/29/2018 04/19/2018  Glucose 65 - 99 mg/dL 103(H) 84 82  BUN 8 - 27 mg/dL 17 20 14   Creatinine 0.76 - 1.27 mg/dL 0.96 0.91 0.85  Sodium 134 - 144 mmol/L 144 142 140  Potassium 3.5 - 5.2 mmol/L 4.2 4.8 4.2  Chloride 96 - 106 mmol/L 106 105 102  CO2 20 - 29 mmol/L 23 22 24   Calcium 8.6 - 10.2 mg/dL 9.5 9.7 9.0  Total Protein 6.0 - 8.5 g/dL 7.0 6.8 -  Total Bilirubin 0.0 - 1.2 mg/dL 0.6 0.5 -  Alkaline Phos 39 - 117 IU/L 155(H) 144(H) -  AST 0 - 40 IU/L 27 27 -  ALT 0 - 44 IU/L 21 18 -      2. Hypercholesterolemia Compliant with medications - Yes Current medications - atorvastatin  Side effects from medications - No Diet - generally healthy  Exercise - none  Lab Results   Component Value Date   CHOL 157 02/22/2019   HDL 79 02/22/2019   LDLCALC 65 02/22/2019   TRIG 65 02/22/2019   CHOLHDL 2.0 02/22/2019     Family and personal medical history reviewed. Smoking and ETOH history reviewed.    3. Vitamin D deficiency Pt is taking oral repletion therapy. Denies bone pain and tenderness, muscle weakness, or fracture.  Lab Results  Component Value Date   VD25OH 28.6 (L) 02/22/2019   VD25OH 26.6 (L) 08/29/2018   VD25OH 34.0 12/12/2017   Lab Results  Component Value Date   CALCIUM 9.5 02/22/2019      4. Seizures ([place]) Compliant with medications. No seizure activity in over 15 years. Denies adverse drug reactions.   5. Gastroesophageal reflux disease without esophagitis Compliant with medications - Yes Current medications - Zantac Adverse side effects - No Cough - No Sore throat - No Voice change - No Hemoptysis - No Dysphagia or dyspepsia - No Water brash - No Red Flags (weight loss, hematochezia, melena, weight loss, early satiety, fevers, odynophagia, or persistent vomiting) - No   Relevant past medical, surgical, family, and social history reviewed and updated as indicated.  Allergies and medications reviewed and updated. Date reviewed: Chart in Epic.   Past Medical History:  Diagnosis Date  . Arteriovenous malformation    caused  intracerebrial bleed w seizure -- post craniotomy 1980  . Arthritis   . Bilateral lower extremity edema   . Bladder tumor   . CAD (coronary artery disease) cardiologist--  dr Daneen Schick   a. 04/27/2016 PCI with DES to RCA with 50% ostial to 60% segmental mid to distal left main, and 90% thrombus filled ostial to proximal circumflex, with distal right coronary filled by collaterals from left-to-right  . COPD with emphysema ([place])   . Dyspnea on exertion   . ED (erectile dysfunction)   . First degree AV block   . GERD (gastroesophageal reflux disease)   . History of adenomatous polyp of colon    09-22-2008   . History of bladder cancer    s/p  resection bladder tumor 04-19-2016  non-invasive low-grade urothelial carcinoma  . History of prostate cancer urologist-  dr Jeffie Pollock-- last PSA 0.02 (summer 2017)   dx 02/ 2013-- Stage T2a, Gleason 7, PSA 1.58--  s/p  radioactive prostate seed implants 01-19-2012  . History of ST elevation myocardial infarction (STEMI)    04-27-2016  . Hyperlipidemia   . Hypertension   . Presence of permanent cardiac pacemaker   . S/P drug eluting coronary stent placement    04-27-2016   . Second degree Mobitz I AV block    occasional w/ first degree heart block  per cardiologist note by dr Daneen Schick  . Seizures ([place]) per pt son -- no seizure's since 1980   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  arteriorvenous cerebral malformation s/p  craniotomy 1980  . Vitamin D deficiency     Past Surgical History:  Procedure Laterality Date  . APPENDECTOMY  2011  . BLEPHAROPLASTY Bilateral revision 02-17-2016  . CARDIAC CATHETERIZATION N/A 04/27/2016   Procedure: Left Heart Cath and Coronary Angiography;  Surgeon: Belva Crome, MD;  Location: [facility];  Service: Cardiovascular;  Laterality: N/A;  acute inferior wall STEMI ;  ostCx to midCx 90%,  ost1st Mrg to 1st Mrg 50%,  mid to distal RCA 100%,  pRCA 80%,  LVEF 45-50% w/ inferobasal akinesis  . CARDIAC CATHETERIZATION N/A 04/27/2016   Procedure: Temporary Pacemaker;  Surgeon: Belva Crome, MD;  Location: [facility];  Service: Cardiovascular;  Laterality: N/A;  mobitz 2  second degree HB w/ heart rate 29bpm  . CARDIAC CATHETERIZATION N/A 04/27/2016   Procedure: Coronary Stent Intervention;  Surgeon: Belva Crome, MD;  Location: [facility];  Service: Cardiovascular;  Laterality: N/A; DES x1 to  Proximal RCA;  DES x1 to Mid RCA  . CATARACT EXTRACTION W/ INTRAOCULAR LENS  IMPLANT, BILATERAL  2016  approx.  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  CYSTOSCOPY  01/19/2012   Procedure: CYSTOSCOPY;  Surgeon: Malka So, MD;  Location: [facility];  Service: Urology;;  no seeds found in bladder  . CYSTOSCOPY W/ RETROGRADES Bilateral 04/19/2016   Procedure: CYSTOSCOPY WITH BILATERAL RETROGRADE PYELOGRAM TRANSURETHRAL RESECTION OF BLADDER TUMOR ;  Surgeon: [name], MD;  Location: [facility];  Service: Urology;  Laterality: Bilateral;  . CYSTOSCOPY WITH BIOPSY N/A 11/10/2016   Procedure: CYSTOSCOPY WITH BIOPSY AND FULGURATION;  Surgeon: [name], MD;  Location: [facility];  Service: Urology;  Laterality: N/A;  . INSERT / REPLACE / REMOVE PACEMAKER  06/15/2017  . LEAD EXTRACTION N/A 01/22/2018   Procedure: LEAD EXTRACTION;  Surgeon: Evans Lance, MD;  Location:  [facility];  Service: Cardiovascular;  Laterality: N/A;  . PACEMAKER IMPLANT N/A 06/15/2017   Procedure: Pacemaker Implant;  Surgeon: [name], MD;  Location: [facility];  Service: Cardiovascular;  Laterality: N/A;  . PACEMAKER IMPLANT N/A 01/25/2018   Procedure: PACEMAKER IMPLANT;  Surgeon: Evans Lance, MD;  Location: [facility];  Service: Cardiovascular;  Laterality: N/A;  . PPM GENERATOR REMOVAL N/A 01/22/2018   Procedure: PPM GENERATOR REMOVAL;  Surgeon: Evans Lance, MD;  Location: [facility];  Service: Cardiovascular;  Laterality: N/A;  . RADIOACTIVE SEED IMPLANT  01/19/2012   Procedure: RADIOACTIVE SEED IMPLANT;  Surgeon: Malka So, MD;  Location: [facility];  Service: Urology;  Laterality: N/A;  68 seeds implanted  . TRANSTHORACIC ECHOCARDIOGRAM  06/02/2016   ef 60-65%/  trivial MR/  mild TR    Social History   Socioeconomic History  . Marital status: Widowed    Spouse name: Not on file  . Number of children: 2  . Years of education: 1 year of college  . Highest education level: Not on file  Occupational History  . Occupation: Retired  Scientific laboratory technician  . Financial resource strain: Not hard  at all  . Food insecurity    Worry: Never true    Inability: Never true  . Transportation needs    Medical: No    Non-medical: No  Tobacco Use  . Smoking status: Former Smoker    Packs/day: 2.00    Years: 15.00    Pack years: 30.00    Quit date: 01/15/1973    Years since quitting: 46.5  . Smokeless tobacco: Never Used  Substance and Sexual Activity  . Alcohol use: Yes    Comment: occasionally  . Drug use: No  . Sexual activity: Not Currently  Lifestyle  . Physical activity    Days per week: 3 days    Minutes per session: 50 min  . Stress: Not at all  Relationships  . Social connections    Talks on phone: More than three times a week    Gets together: More than three times a week    Attends religious service: More than 4 times per year    Active member of club or organization: Yes    Attends meetings of clubs or organizations: More than 4 times per year    Relationship status: Widowed  . Intimate partner violence    Fear of current or ex partner: No    Emotionally abused: No    Physically abused: No    Forced sexual activity: No  Other Topics Concern  . Not on file  Social History Narrative   Martin Majestic to [facility] after MI for rehab.    Outpatient Encounter Medications as of 07/19/2019  Medication Sig  . atorvastatin (LIPITOR) 20 MG tablet Take 1 tablet (20 mg total) by mouth daily.  . calcium carbonate (OSCAL) 1500 (600 Ca) MG TABS tablet Take 600 mg of elemental calcium by mouth daily with breakfast.  . Cholecalciferol (VITAMIN D3) 1000 units CAPS Take 1 capsule by mouth daily.  . citalopram (CELEXA) 20 MG tablet Take 1 tablet (20 mg total) by mouth daily.  . clopidogrel (PLAVIX) 75 MG tablet Take 1 tablet (75 mg total) by mouth daily.  . ferrous sulfate 325 (65 FE) MG tablet Take 1 tablet (325 mg total) by mouth daily with breakfast.  . MAGNESIUM PO Take 1 tablet by mouth at bedtime.  Marland Kitchen  Multiple Vitamins-Minerals (PRESERVISION AREDS 2) CAPS Take 1 capsule by  mouth 2 (two) times daily.   Marland Kitchen PHENobarbital (LUMINAL) 64.8 MG tablet Take 1 tablet (64.8 mg total) by mouth 2 (two) times daily.  . phenytoin (DILANTIN) 100 MG ER capsule Take 2 capsules (200 mg total) by mouth 2 (two) times daily. (Please make your September appt)  . polyvinyl alcohol (LIQUIFILM TEARS) 1.4 % ophthalmic solution 1 drop as needed.  . ranitidine (ZANTAC) 75 MG tablet Take 75 mg by mouth 2 (two) times daily.  Marland Kitchen spironolactone (ALDACTONE) 25 MG tablet Take 1 tablet (25 mg total) by mouth daily.   No facility-administered encounter medications on file as of 07/19/2019.     Allergies  Allergen Reactions  . Penicillins Rash    Has patient had a PCN reaction causing immediate rash, facial/tongue/throat swelling, SOB or lightheadedness with hypotension: ##Yes## Has patient had a PCN reaction causing severe rash involving mucus membranes or skin necrosis: No Has patient had a PCN reaction that required hospitalization No Has patient had a PCN reaction occurring within the last 10 years: No If all of the above answers are "NO", then may proceed with Cephalosporin use.     Review of Systems  Constitutional: Negative for activity change, appetite change, chills, diaphoresis, fatigue, fever and unexpected weight change.  HENT: Negative.   Eyes: Negative.  Negative for photophobia and visual disturbance.  Respiratory: Negative for cough, chest tightness and shortness of breath.   Cardiovascular: Negative for chest pain, palpitations and leg swelling.  Gastrointestinal: Negative for abdominal distention, abdominal pain, anal bleeding, blood in stool, constipation (BM every 2-3 days, normal for pt), diarrhea, nausea, rectal pain and vomiting.  Endocrine: Negative.  Negative for cold intolerance, heat intolerance, polydipsia, polyphagia and polyuria.  Genitourinary: Negative for decreased urine volume, difficulty urinating, dysuria, flank pain, frequency, hematuria, penile pain, penile  swelling, scrotal swelling, testicular pain and urgency.       Followed by Dr. Jeffie Pollock for bladder and prostate CA  Musculoskeletal: Positive for arthralgias (chronic bilateral knee pain) and gait problem (antalgic, uses cane). Negative for myalgias.  Skin: Negative.  Negative for color change.  Allergic/Immunologic: Negative.   Neurological: Positive for weakness (generalized). Negative for dizziness, tremors, seizures, syncope, facial asymmetry, speech difficulty, light-headedness, numbness and headaches.  Hematological: Negative.  Negative for adenopathy. Does not bruise/bleed easily.  Psychiatric/Behavioral: Negative for confusion, hallucinations, sleep disturbance and suicidal ideas.  All other systems reviewed and are negative.       Objective:  BP 139/66   Pulse 66   Temp (!) 97.1 F (36.2 C) (Temporal)   Resp 20   Ht 5' 10"  (1.778 m)   Wt 187 lb (84.8 kg)   SpO2 94%   BMI 26.83 kg/m    Wt Readings from Last 3 Encounters:  07/19/19 187 lb (84.8 kg)  01/15/19 185 lb (83.9 kg)  11/15/18 190 lb 6 oz (86.4 kg)    Physical Exam Vitals signs and nursing note reviewed.  Constitutional:      General: He is not in acute distress.    Appearance: Normal appearance. He is well-developed and well-groomed. He is not ill-appearing, toxic-appearing or diaphoretic.  HENT:     Head: Normocephalic and atraumatic.     Jaw: There is normal jaw occlusion.     Right Ear: Hearing normal.     Left Ear: Hearing normal.     Nose: Nose normal.     Mouth/Throat:     Lips: Pink.  Mouth: Mucous membranes are moist.     Pharynx: Oropharynx is clear. Uvula midline.  Eyes:     General: Lids are normal.     Extraocular Movements: Extraocular movements intact.     Conjunctiva/sclera: Conjunctivae normal.     Pupils: Pupils are equal, round, and reactive to light.  Neck:     Musculoskeletal: Normal range of motion and neck supple.     Thyroid: No thyroid mass, thyromegaly or thyroid  tenderness.     Vascular: No carotid bruit or JVD.     Trachea: Trachea and phonation normal.  Cardiovascular:     Rate and Rhythm: Normal rate and regular rhythm.     Chest Wall: PMI is not displaced.     Pulses: Normal pulses.     Heart sounds: Normal heart sounds. No murmur. No friction rub. No gallop.   Pulmonary:     Effort: Pulmonary effort is normal. No respiratory distress.     Breath sounds: Normal breath sounds. No wheezing.  Abdominal:     General: Bowel sounds are normal. There is no distension or abdominal bruit.     Palpations: Abdomen is soft. There is no hepatomegaly or splenomegaly.     Tenderness: There is no abdominal tenderness. There is no right CVA tenderness or left CVA tenderness.     Hernia: No hernia is present.  Musculoskeletal:     Right hip: Normal.     Left hip: Normal.     Right knee: He exhibits decreased range of motion. He exhibits no swelling, no effusion, no ecchymosis, no deformity, no laceration, no erythema, normal alignment, no LCL laxity, normal patellar mobility, no bony tenderness, normal meniscus and no MCL laxity. No tenderness found.     Left knee: He exhibits decreased range of motion. He exhibits no swelling, no effusion, no ecchymosis, no deformity, no laceration, no erythema, normal alignment, no LCL laxity, normal patellar mobility, no bony tenderness, normal meniscus and no MCL laxity. No tenderness found.     Right ankle: Normal.     Left ankle: Normal.     Right lower leg: No edema.     Left lower leg: No edema.  Lymphadenopathy:     Cervical: No cervical adenopathy.  Skin:    General: Skin is warm and dry.     Capillary Refill: Capillary refill takes less than 2 seconds.     Coloration: Skin is not cyanotic, jaundiced or pale.     Findings: No rash.  Neurological:     General: No focal deficit present.     Mental Status: He is alert and oriented to person, place, and time.     Cranial Nerves: Cranial nerves are intact.      Sensory: Sensation is intact.     Motor: Motor function is intact.     Coordination: Coordination is intact.     Gait: Gait abnormal (antalgic, uses cane).     Deep Tendon Reflexes: Reflexes are normal and symmetric.  Psychiatric:        Attention and Perception: Attention and perception normal.        Mood and Affect: Mood and affect normal.        Speech: Speech normal.        Behavior: Behavior normal. Behavior is cooperative.        Thought Content: Thought content normal.        Cognition and Memory: Cognition and memory normal.        Judgment: Judgment  normal.     Results for orders placed or performed in visit on 06/11/19  CUP [place] CHECK  Result Value Ref Range   Date Time Interrogation Session [id number]    Pulse Generator Manufacturer MERM    Pulse Gen Model W1DR01 Azure XT DR MRI    Pulse Gen Serial Number [id number] [facility] Name [facility] Pulse Generator Type Implantable Pulse Generator    Implantable Pulse Generator Implant Date 94765465    Implantable Lead Manufacturer MERM    Implantable Lead Model 3830 SelectSecure    Implantable Lead Serial Number [id number]    Implantable Lead Implant Date [id number]    Implantable Lead Location Detail 1 UNKNOWN    Implantable Lead Special Function BUNDLE OF HIS    Implantable Lead Location [id number]    Implantable Lead Manufacturer MERM    Implantable Lead Model 5076 CapSureFix Novus MRI SureScan    Implantable Lead Serial Number [id number]    Implantable Lead Implant Date 12751700    Implantable Lead Location Detail 1 APPENDAGE    Implantable Lead Location [id number]    Lead Channel Setting Sensing Sensitivity 0.9 mV   Lead Channel Setting Pacing Amplitude 2 V   Lead Channel Setting Pacing Pulse Width 0.4 ms   Lead Channel Setting Pacing Amplitude 2.75 V   Lead Channel Impedance Value 513 ohm   Lead Channel Impedance Value 361 ohm   Lead Channel Sensing Intrinsic Amplitude 0.875 mV    Lead Channel Sensing Intrinsic Amplitude 0.875 mV   Lead Channel Pacing Threshold Amplitude 1 V   Lead Channel Pacing Threshold Pulse Width 0.4 ms   Lead Channel Impedance Value 494 ohm   Lead Channel Impedance Value 266 ohm   Lead Channel Sensing Intrinsic Amplitude 3.125 mV   Lead Channel Sensing Intrinsic Amplitude 3.125 mV   Lead Channel Pacing Threshold Amplitude 1.25 V   Lead Channel Pacing Threshold Pulse Width 0.4 ms   Battery Status OK    Battery Remaining Longevity 111 mo   Battery Voltage 3.02 V   Brady Statistic RA Percent Paced 48.42 %   Brady Statistic RV Percent Paced 99.44 %   Brady Statistic AP VP Percent 48.38 %   Brady Statistic AS VP Percent 51.06 %   Brady Statistic AP VS Percent 0.02 %   Brady Statistic AS VS Percent 0.54 %       Pertinent labs & imaging results that were available during my care of the patient were reviewed by me and considered in my medical decision making.  Assessment & Plan:  Earnie Larsson was seen today for medical management of chronic issues.  Diagnoses and all orders for this visit:  Essential hypertension BP well controlled. Changes were not made in regimen today. Goal BP is 130/80. Pt aware to report any persistent high or low readings. DASH diet and exercise encouraged. Exercise at least 150 minutes per week and increase as tolerated. Goal BMI > 25. Stress management encouraged. Avoid nicotine and tobacco product use. Avoid excessive alcohol and NSAID's. Avoid more than 2000 mg of sodium daily. Medications as prescribed. Follow up as scheduled.  DASH diet and exercise encouraged. Report any persistent high or low readings.  -     CBC with Differential/Platelet -     CMP14+EGFR -     Thyroid Panel With TSH  Hypercholesterolemia Diet encouraged - increase intake of fresh fruits and vegetables, increase intake of lean proteins. Bake, broil, or grill foods. Avoid  fried, greasy, and fatty foods. Avoid fast foods. Increase intake of fiber-rich  whole grains. Exercise encouraged - at least 150 minutes per week and advance as tolerated.  Goal BMI < 25. Continue medications as prescribed. Follow up in 3-6 months as discussed.   Vitamin D deficiency Labs pending. Continue repletion therapy. If indicated, will change repletion dosage. Eat foods rich in Vit D including milk, orange juice, yogurt with vitamin D added, salmon or mackerel, canned tuna fish, cereals with vitamin D added, and cod liver oil. Get out in the sun but make sure to wear at least SPF 30 sunscreen.  -     VITAMIN D 25 Hydroxy (Vit-D Deficiency, Fractures)  Seizures (HCC) Well controlled with current medications. No seizure activity in over 15 years.  -     Phenytoin level, total -     Phenobarbital level  Gastroesophageal reflux disease without esophagitis No red flags present. Diet discussed. Avoid fried, spicy, fatty, greasy, and acidic foods. Avoid caffeine, nicotine, and alcohol. Do not eat 2-3 hours before bedtime and stay upright for at least 1-2 hours after eating. Eat small frequent meals. Avoid NSAID's like motrin and aleve. Medications as prescribed. Report any new or worsening symptoms. Follow up as discussed or sooner if needed.    High risk medication use -     Phenytoin level, total -     Phenobarbital level     Continue all other maintenance medications.  Follow up plan: Return in about 4 months (around 11/17/2019), or if symptoms worsen or fail to improve.  Continue healthy lifestyle choices, including diet (rich in fruits, vegetables, and lean proteins, and low in salt and simple carbohydrates) and exercise (at least 30 minutes of moderate physical activity daily).  Educational handout given for survey, COVID-19  The above assessment and management plan was discussed with the patient. The patient verbalized understanding of and has agreed to the management plan. Patient is aware to call the clinic if they develop any new symptoms or if symptoms  persist or worsen. Patient is aware when to return to the clinic for a follow-up visit. Patient educated on when it is appropriate to go to the emergency department.   Monia Pouch, FNP-C [facility] [phone]

## 2019-07-19 NOTE — Patient Instructions (Signed)
It was a pleasure seeing you today, Scientist, physiological.  Information regarding what we discussed is included in this packet.  Please make an appointment to see me in 3-4 months.   In a few days you may receive a survey in the mail or online from [employer] regarding your visit with [place] today. Please take a moment to fill this out. Your feedback is very important to our office. It can help [place] better understand your needs as well as improve your experience and satisfaction. Thank you for taking your time to complete it. We care about you.  Because of recent events of COVID-19 ("Coronavirus"), please follow CDC recommendations:   1. Wash your hand frequently 2. Avoid touching your face 3. Stay away from people who are sick 4. If you have symptoms such as fever, cough, shortness of breath then call your healthcare provider for further guidance 5. If you are sick, STAY AT HOME, unless otherwise directed by your healthcare provider. 6. Follow directions from state and national officials regarding staying safe    Please feel free to call our office if any questions or concerns arise.  Warm Regards, Monia Pouch, FNP-C [facility] [address] [place], [place] [address] [phone]

## 2019-07-20 LAB — CBC WITH DIFFERENTIAL/PLATELET
Basophils Absolute: 0.1 10*3/uL (ref 0.0–0.2)
Basos: 1 %
EOS (ABSOLUTE): 1 10*3/uL — ABNORMAL HIGH (ref 0.0–0.4)
Eos: 10 %
Hematocrit: 32.4 % — ABNORMAL LOW (ref 37.5–51.0)
Hemoglobin: 11.5 g/dL — ABNORMAL LOW (ref 13.0–17.7)
Immature Grans (Abs): 0 10*3/uL (ref 0.0–0.1)
Immature Granulocytes: 0 %
Lymphocytes Absolute: 2.5 10*3/uL (ref 0.7–3.1)
Lymphs: 25 %
MCH: 31.1 pg (ref 26.6–33.0)
MCHC: 35.5 g/dL (ref 31.5–35.7)
MCV: 88 fL (ref 79–97)
Monocytes Absolute: 1.2 10*3/uL — ABNORMAL HIGH (ref 0.1–0.9)
Monocytes: 12 %
Neutrophils Absolute: 5.4 10*3/uL (ref 1.4–7.0)
Neutrophils: 52 %
Platelets: 345 10*3/uL (ref 150–450)
RBC: 3.7 x10E6/uL — ABNORMAL LOW (ref 4.14–5.80)
RDW: 14.9 % (ref 11.6–15.4)
WBC: 10.3 10*3/uL (ref 3.4–10.8)

## 2019-07-20 LAB — THYROID PANEL WITH TSH
Free Thyroxine Index: 1.4 (ref 1.2–4.9)
T3 Uptake Ratio: 27 % (ref 24–39)
T4, Total: 5.1 ug/dL (ref 4.5–12.0)
TSH: 2.8 u[IU]/mL (ref 0.450–4.500)

## 2019-07-20 LAB — CMP14+EGFR
ALT: 19 IU/L (ref 0–44)
AST: 28 IU/L (ref 0–40)
Albumin/Globulin Ratio: 1.5 (ref 1.2–2.2)
Albumin: 4.1 g/dL (ref 3.6–4.6)
Alkaline Phosphatase: 135 IU/L — ABNORMAL HIGH (ref 39–117)
BUN/Creatinine Ratio: 20 (ref 10–24)
BUN: 20 mg/dL (ref 8–27)
Bilirubin Total: 0.4 mg/dL (ref 0.0–1.2)
CO2: 22 mmol/L (ref 20–29)
Calcium: 9.7 mg/dL (ref 8.6–10.2)
Chloride: 105 mmol/L (ref 96–106)
Creatinine, Ser: 1.01 mg/dL (ref 0.76–1.27)
GFR calc Af Amer: 78 mL/min/{1.73_m2} (ref >59)
GFR calc non Af Amer: 68 mL/min/{1.73_m2} (ref >59)
Globulin, Total: 2.7 g/dL (ref 1.5–4.5)
Glucose: 78 mg/dL (ref 65–99)
Potassium: 4.5 mmol/L (ref 3.5–5.2)
Sodium: 141 mmol/L (ref 134–144)
Total Protein: 6.8 g/dL (ref 6.0–8.5)

## 2019-07-20 LAB — PHENYTOIN LEVEL, TOTAL: Phenytoin (Dilantin), Serum: 8.7 ug/mL — ABNORMAL LOW (ref 10.0–20.0)

## 2019-07-20 LAB — VITAMIN D 25 HYDROXY (VIT D DEFICIENCY, FRACTURES): Vit D, 25-Hydroxy: 30.9 ng/mL (ref 30.0–100.0)

## 2019-07-20 LAB — PHENOBARBITAL LEVEL: Phenobarbital, Serum: 25 ug/mL (ref 15–40)

## 2019-07-25 DIAGNOSIS — H04123 Dry eye syndrome of bilateral lacrimal glands: Secondary | ICD-10-CM | POA: Diagnosis not present

## 2019-07-25 DIAGNOSIS — Z961 Presence of intraocular lens: Secondary | ICD-10-CM | POA: Diagnosis not present

## 2019-07-25 DIAGNOSIS — H353231 Exudative age-related macular degeneration, bilateral, with active choroidal neovascularization: Secondary | ICD-10-CM | POA: Diagnosis not present

## 2019-08-01 ENCOUNTER — Telehealth: Payer: Self-pay | Admitting: Family Medicine

## 2019-08-01 NOTE — Chronic Care Management (AMB) (Signed)
Chronic Care Management   Note  08/01/2019 Name: Joshua [name] MRN: [id number] DOB: [birth year]  Joshua [name] is a 85 y.o. year old male who is a primary care patient of Rakes, Connye Burkitt, FNP. I reached out to [name] by phone today in response to a referral sent by [name] health plan.     [name] was given information about Chronic Care Management services today including:  1. CCM service includes personalized support from designated clinical staff supervised by his physician, including individualized plan of care and coordination with other care providers 2. 24/7 contact phone numbers for assistance for urgent and routine care needs. 3. Service will only be billed when office clinical staff spend 20 minutes or more in a month to coordinate care. 4. Only one practitioner may furnish and bill the service in a calendar month. 5. The patient may stop CCM services at any time (effective at the end of the month) by phone call to the office staff. 6. The patient will be responsible for cost sharing (co-pay) of up to 20% of the service fee (after annual deductible is met).  Patient agreed to services and verbal consent obtained.   Follow up plan: Telephone appointment with CCM team member scheduled for: 08/09/2019   [place], [place] [facility]  [place], [place] [address] Direct Dial: [email or web address]  Website: [email or web address]

## 2019-08-09 ENCOUNTER — Ambulatory Visit (INDEPENDENT_AMBULATORY_CARE_PROVIDER_SITE_OTHER): Payer: Medicare Other | Admitting: *Deleted

## 2019-08-09 DIAGNOSIS — I10 Essential (primary) hypertension: Secondary | ICD-10-CM

## 2019-08-09 DIAGNOSIS — R2681 Unsteadiness on feet: Secondary | ICD-10-CM

## 2019-08-09 DIAGNOSIS — R569 Unspecified convulsions: Secondary | ICD-10-CM

## 2019-08-09 NOTE — Patient Instructions (Signed)
Visit Information  Goals Addressed            This Visit's Progress     Patient Stated   . Fall Prevention (pt-stated)       Current Barriers:  Marland Kitchen Knowledge Deficits related to fall precautions . Decreased adherence to prescribed treatment for fall prevention . Balance problems  Nurse Case Manager Clinical Goal(s):  Marland Kitchen Over the next 30 days, patient will demonstrate improved adherence to prescribed treatment plan for decreasing falls as evidenced by patient reporting and review of EMR . Over the next 30days, patient will verbalize using fall risk reduction strategies discussed . Over the next 30 days, patient will not experience any falls   Interventions:  . Provided verbal education re: Potential causes of falls and Fall prevention strategies . Reviewed medications and discussed potential side effects of medications such as dizziness and frequent urination . Assessed for s/s of orthostatic hypotension . Assessed for falls since last encounter. . Assessed patients knowledge of fall risk prevention secondary to previously provided education. . Assessed working status of life alert bracelet and patient adherence.Patient confirmed proper usage.   Patient Self Care Activities:  . Utilize walking stick (assistive device) appropriately with all ambulation . De-clutter walkways . Change positions slowly . Wear secure fitting shoes at all times with ambulation . Utilize home lighting for dim lit areas . Have self and pet awareness at all times  Plan: . CCM RN CM will follow up in 45   Initial goal documentation     . RN Initial CCM Goal (pt-stated)        General Goal for Embedded Chronic Care Management Program  Current Barriers:  . Chronic Disease Management support and education needs related to hypertension, GERD, hyperlipidemia, seizure disorder  Nurse Case Manager Clinical Goal(s):  Marland Kitchen Over the next 30 days, Mr. Hooven will reach out to the RN Care manager with any  questions or concerns related to his chronic medical conditions . Over the next 60 days, RN Care Manager will reach back out to Mr Alberts to follow up on his self management of chronic medical conditions and assess current needs at that time  Interventions:  . A review of relevant office notes, correspondence notes, imaging studies, lab reports, history and health care maintenance items was performed and pertinent items were discussed with the patient . Collaboration with PCP and appropriate care team members performed as necessary . Medications reviewed with the patient and reconciled . Intake Assessment, General Assessments, and Social Determinants of Health were completed (any needs identified are addressed in a separate goal) . CCM resources reviewed and team member roles explained. All questions were answered. . Patient provided with CCM contact information and recommended that he reach out with any chronic care management needs . Will mail printed materials with CCM consent information, CCM contact information, current goals, and follow-up plan  Patient Self Care Activities:  . Performs ADL's independently . Performs IADL's independently . Physically able to do IADLs but Has help with yardwork, cleaning, and errands  Initial goal documentation        Print copy of patient instructions provided.   The care management team will reach out to the patient again over the next 45 days.   Chong Sicilian, BSN, RN-BC Embedded Chronic Care Manager [facility] Direct Dial: [phone]

## 2019-08-09 NOTE — Chronic Care Management (AMB) (Signed)
Chronic Care Management   Initial Visit Note  08/09/2019 Name: ALTO MESMER MRN: [id number] DOB: [birth year]  Referred by: Baruch Gouty, FNP Reason for referral : Chronic Care Management (CCM Initial Visit)   . MAKELL KEIZER is a 85 y.o. year old male who is a primary care patient of Baruch Gouty, FNP. The CCM team was consulted for assistance with chronic disease management and care coordination needs related to hypertension, GERD, hyperlipidemia, and seizure disorder.   Review of patient status, including review of consultants reports, relevant laboratory and other test results, and collaboration with appropriate care team members and the patient's provider was performed as part of comprehensive patient evaluation and provision of chronic care management services.    SDOH (Social Determinants of Health) screening performed today: No pressing needs identified today.   SUBJECTIVE: I spoke with Mr Sarazin by telephone today. He was a primary care patient of Dr Tawanna Sat until his retirement in July and is now under the care of Monia Pouch, [place]. 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 He lives alone but sees his son daily and has dinner with him weekly. His daughter retired and moved to [place] but he talks with her often. He has not been to a store or restaurant since [employer] began in March. He does have hired help that comes in and cleans, runs errands, and does his yardwork. He feels that he is handling the quarantine well and doesn't feel socially isolated. He goes out to his office daily but he really doesn't socialize with anyone but his son. He does not have any problems with access to food, transportation, or medication.    Overall he feels that his health is fair, but good for his age. He does not have any pain on a regular basis and gets around acceptably. He does use a walking stick for balance. He prefers this over a cane because it helps him to walk  more erect. He would like to maintain contact with the CCM team in case any needs arise.   OBJECTIVE:  Outpatient Encounter Medications as of 08/09/2019  Medication Sig  . atorvastatin (LIPITOR) 20 MG tablet Take 1 tablet (20 mg total) by mouth daily.  . calcium carbonate (OSCAL) 1500 (600 Ca) MG TABS tablet Take 600 mg of elemental calcium by mouth daily with breakfast.  . Cholecalciferol (VITAMIN D3) 1000 units CAPS Take 1 capsule by mouth daily.  . citalopram (CELEXA) 20 MG tablet Take 1 tablet (20 mg total) by mouth daily.  . clopidogrel (PLAVIX) 75 MG tablet Take 1 tablet (75 mg total) by mouth daily.  . ferrous sulfate 325 (65 FE) MG tablet Take 1 tablet (325 mg total) by mouth daily with breakfast.  . MAGNESIUM PO Take 1 tablet by mouth at bedtime.  . Multiple Vitamins-Minerals (PRESERVISION AREDS 2) CAPS Take 1 capsule by mouth 2 (two) times daily.   Marland Kitchen PHENobarbital (LUMINAL) 64.8 MG tablet Take 1 tablet (64.8 mg total) by mouth 2 (two) times daily.  . phenytoin (DILANTIN) 100 MG ER capsule Take 2 capsules (200 mg total) by mouth 2 (two) times daily. (Please make your September appt)  . polyvinyl alcohol (LIQUIFILM TEARS) 1.4 % ophthalmic solution 1 drop as needed.  . ranitidine (ZANTAC) 75 MG tablet Take 75 mg by mouth 2 (two) times daily.  Marland Kitchen spironolactone (ALDACTONE) 25 MG tablet Take 1 tablet (25 mg total) by mouth daily.   No facility-administered encounter medications  on file as of 08/09/2019.     Wt Readings from Last 3 Encounters:  07/19/19 187 lb (84.8 kg)  01/15/19 185 lb (83.9 kg)  11/15/18 190 lb 6 oz (86.4 kg)   BMI Readings from Last 3 Encounters:  07/19/19 26.83 kg/m  01/15/19 26.54 kg/m  11/15/18 27.32 kg/m   Fall Risk  07/19/2019 11/15/2018  Falls in the past year? 1 1  Number falls in past yr: 1 1  Injury with Fall? 0 0  Comment - -  Risk Factor Category  - -  Risk for fall due to : - Impaired balance/gait;Impaired mobility  Follow up - Education  provided;Falls prevention discussed    RN ASSESSMENT & CARE PLAN   . Fall Prevention (pt-stated)       Current Barriers:  Marland Kitchen Knowledge Deficits related to fall precautions . Decreased adherence to prescribed treatment for fall prevention . Balance problems  Nurse Case Manager Clinical Goal(s):  Marland Kitchen Over the next 30 days, patient will demonstrate improved adherence to prescribed treatment plan for decreasing falls as evidenced by patient reporting and review of EMR . Over the next 30days, patient will verbalize using fall risk reduction strategies discussed . Over the next 30 days, patient will not experience any falls   Interventions:  . Provided verbal education re: Potential causes of falls and Fall prevention strategies . Reviewed medications and discussed potential side effects of medications such as dizziness and frequent urination . Assessed for s/s of orthostatic hypotension . Assessed for falls since last encounter. . Assessed patients knowledge of fall risk prevention secondary to previously provided education. . Assessed working status of life alert bracelet and patient adherence.Patient confirmed proper usage.   Patient Self Care Activities:  . Utilize walking stick (assistive device) appropriately with all ambulation . De-clutter walkways . Change positions slowly . Wear secure fitting shoes at all times with ambulation . Utilize home lighting for dim lit areas . Have self and pet awareness at all times  Plan: . CCM RN CM will follow up in 45   Initial goal documentation     . RN Initial CCM Goal        General Goal for Embedded Chronic Care Management Program  Current Barriers:  . Chronic Disease Management support and education needs related to hypertension, GERD, hyperlipidemia, seizure disorder  Nurse Case Manager Clinical Goal(s):  Marland Kitchen Over the next 30 days, Mr. Mackler will reach out to the RN Care manager with any questions or concerns related to his chronic  medical conditions . Over the next 60 days, RN Care Manager will reach back out to Mr Melon to follow up on his self management of chronic medical conditions and assess current needs at that time  Interventions:  . A review of relevant office notes, correspondence notes, imaging studies, lab reports, history and health care maintenance items was performed and pertinent items were discussed with the patient . Collaboration with PCP and appropriate care team members performed as necessary . Medications reviewed with the patient and reconciled . Intake Assessment, General Assessments, and Social Determinants of Health were completed (any needs identified are addressed in a separate goal) . CCM resources reviewed and team member roles explained. All questions were answered. . Patient provided with CCM contact information and recommended that he reach out with any chronic care management needs . Will mail printed materials with CCM consent information, CCM contact information, current goals, and follow-up plan  Patient Self Care Activities:  . Performs  ADL's independently . Performs IADL's independently . Physically able to do IADLs but Has help with yardwork, cleaning, and errands  Initial goal documentation        Follow-up Plan  Patient provided with CCM contact information and encouraged to reach out as needed  RN will reach out to patient by telephone over the next 45 days   Chong Sicilian, BSN, RN-BC [place] [facility] [place] [facility] Direct Dial: [phone]

## 2019-08-19 NOTE — Progress Notes (Signed)
Cardiology Office Note:    Date:  08/21/2019   ID:  Joshua [name], DOB 09/29/1931, MRN [id number]  PCP:  Joshua Gouty, FNP  Cardiologist:  Joshua Grooms, MD   Referring MD: No ref. provider found   Chief Complaint  Patient presents with  . Coronary Artery Disease  . Congestive Heart Failure    History of Present Illness:    Joshua [name] is a 85 y.o. male with a hx of szdisorder that followed intracerebral bleed from AV malformation,, prostate CA, bladder CAHTN, HLD, GERD, recent STEMI 05/10/2016 w/ DES toRCA complicated by Mobitz II-->HR improved and PPM not indicated.Subsequent pacemaker infection requiring device re-implantation 01/2018.  Denies angina.  Has been having dyspnea on exertion.  However, he denies orthopnea.  Has had trace lower extremity edema.  He denies palpitations.  Past Medical History:  Diagnosis Date  . 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 Arthritis   . Bilateral lower extremity edema   . Bladder tumor   . CAD (coronary artery disease) cardiologist--  dr Joshua [name]   a. 04/27/2016 PCI with DES to RCA with 50% ostial to 60% segmental mid to distal left main, and 90% thrombus filled ostial to proximal circumflex, with distal right coronary filled by collaterals from left-to-right  . COPD with emphysema ([place])   . Dyspnea on exertion   . ED (erectile dysfunction)   . First degree AV block   . GERD (gastroesophageal reflux disease)   . History of adenomatous polyp of colon    09-22-2008  . History of bladder cancer    s/p  resection bladder tumor 04-19-2016  non-invasive low-grade urothelial carcinoma  . History of prostate cancer urologist-  dr Joshua [name]-- last PSA 0.02 (summer 2017)   dx 02/ 2013-- Stage T2a, Gleason 7, PSA 1.58--  s/p  radioactive prostate seed implants 01-19-2012  . History of ST elevation myocardial infarction (STEMI)    04-27-2016  . Hyperlipidemia   . Hypertension    . Presence of permanent cardiac pacemaker   . S/P drug eluting coronary stent placement    04-27-2016   . Second degree Mobitz I AV block    occasional w/ first degree heart block  per cardiologist note by dr Joshua [name]  . Seizures ([place]) per pt son -- no seizure's since 1980   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  arteriorvenous cerebral malformation s/p  craniotomy 1980  . Vitamin D deficiency     Past Surgical History:  Procedure Laterality Date  . APPENDECTOMY  2011  . BLEPHAROPLASTY Bilateral revision 02-17-2016  . CARDIAC CATHETERIZATION N/A 04/27/2016   Procedure: Left Heart Cath and Coronary Angiography;  Surgeon: Belva Crome, MD;  Location: [facility];  Service: Cardiovascular;  Laterality: N/A;  acute inferior wall STEMI ;  ostCx to midCx 90%,  ost1st Mrg to 1st Mrg 50%,  mid to distal RCA 100%,  pRCA 80%,  LVEF 45-50% w/ inferobasal akinesis  . CARDIAC CATHETERIZATION N/A 04/27/2016   Procedure: Temporary Pacemaker;  Surgeon: Belva Crome, MD;  Location: [place];  Service: Cardiovascular;  Laterality: N/A;  mobitz 2  second degree HB w/ heart rate 29bpm  . CARDIAC CATHETERIZATION N/A 04/27/2016   Procedure: Coronary Stent Intervention;  Surgeon: Belva Crome, MD;  Location: [place] [facility] [place] [facility];  Service: Cardiovascular;  Laterality: N/A; DES x1 to  Proximal RCA;  DES x1  to Mid RCA  . CATARACT EXTRACTION W/ INTRAOCULAR LENS  IMPLANT, BILATERAL  2016  approx.  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 CYSTOSCOPY  01/19/2012   Procedure: CYSTOSCOPY;  Surgeon: Malka So, MD;  Location: [facility];  Service: Urology;;  no seeds found in bladder  . CYSTOSCOPY W/ RETROGRADES Bilateral 04/19/2016   Procedure: CYSTOSCOPY WITH BILATERAL RETROGRADE PYELOGRAM TRANSURETHRAL RESECTION OF BLADDER TUMOR ;  Surgeon: [name], MD;  Location: [facility];  Service: Urology;  Laterality: Bilateral;  .  CYSTOSCOPY WITH BIOPSY N/A 11/10/2016   Procedure: CYSTOSCOPY WITH BIOPSY AND FULGURATION;  Surgeon: [name], MD;  Location[facility];  Service: Urology;  Laterality: N/A;  . INSERT / REPLACE / REMOVE PACEMAKER  06/15/2017  . LEAD EXTRACTION N/A 01/22/2018   Procedure: LEAD EXTRACTION;  Surgeon: Evans Lance, MD;  Location: [facility];  Service: Cardiovascular;  Laterality: N/A;  . PACEMAKER IMPLANT N/A 06/15/2017   Procedure: Pacemaker Implant;  Surgeon: [name], MD;  Location: [facility];  Service: Cardiovascular;  Laterality: N/A;  . PACEMAKER IMPLANT N/A 01/25/2018   Procedure: PACEMAKER IMPLANT;  Surgeon: Evans Lance, MD;  Location: [facility];  Service: Cardiovascular;  Laterality: N/A;  . PPM GENERATOR REMOVAL N/A 01/22/2018   Procedure: PPM GENERATOR REMOVAL;  Surgeon: Evans Lance, MD;  Location: [place] [facility];  Service: Cardiovascular;  Laterality: N/A;  . RADIOACTIVE SEED IMPLANT  01/19/2012   Procedure: RADIOACTIVE SEED IMPLANT;  Surgeon: Malka So, MD;  Location[facility];  Service: Urology;  Laterality: N/A;  68 seeds implanted  . TRANSTHORACIC ECHOCARDIOGRAM  06/02/2016   ef 60-65%/  trivial MR/  mild TR    Current Medications: Current Meds  Medication Sig  . atorvastatin (LIPITOR) 20 MG tablet Take 1 tablet (20 mg total) by mouth daily.  . calcium carbonate (OSCAL) 1500 (600 Ca) MG TABS tablet Take 600 mg of elemental calcium by mouth daily with breakfast.  . Cholecalciferol (VITAMIN D3) 1000 units CAPS Take 1 capsule by mouth daily.  . citalopram (CELEXA) 20 MG tablet Take 1 tablet (20 mg total) by mouth daily.  . clopidogrel (PLAVIX) 75 MG tablet Take 1 tablet (75 mg total) by mouth daily.  . ferrous sulfate 325 (65 FE) MG tablet Take 1 tablet (325 mg total) by mouth daily with breakfast.  . MAGNESIUM PO Take 1 tablet by mouth at bedtime.  . Multiple Vitamins-Minerals (PRESERVISION AREDS 2) CAPS  Take 1 capsule by mouth 2 (two) times daily.   Marland Kitchen PHENobarbital (LUMINAL) 64.8 MG tablet Take 1 tablet (64.8 mg total) by mouth 2 (two) times daily.  . phenytoin (DILANTIN) 100 MG ER capsule Take 2 capsules (200 mg total) by mouth 2 (two) times daily. (Please make your September appt)  . polyvinyl alcohol (LIQUIFILM TEARS) 1.4 % ophthalmic solution 1 drop as needed.  . ranitidine (ZANTAC) 75 MG tablet Take 75 mg by mouth 2 (two) times daily.  Marland Kitchen spironolactone (ALDACTONE) 25 MG tablet Take 1 tablet (25 mg total) by mouth daily.     Allergies:   Penicillins   Social History   Socioeconomic History  . Marital status: Widowed    Spouse name: Not on file  . Number of children: 2  . Years of education: 1 year of college  . Highest education level: Not on file  Occupational History  . Occupation:  Retired  Scientific laboratory technician  . Financial resource strain: Not hard at all  . Food insecurity    Worry: Never true    Inability: Never true  . Transportation needs    Medical: No    Non-medical: No  Tobacco Use  . Smoking status: Former Smoker    Packs/day: 2.00    Years: 15.00    Pack years: 30.00    Quit date: 01/15/1973    Years since quitting: 46.6  . Smokeless tobacco: Never Used  Substance and Sexual Activity  . Alcohol use: Yes    Comment: occasionally  . Drug use: No  . Sexual activity: Not Currently  Lifestyle  . Physical activity    Days per week: 3 days    Minutes per session: 30 min  . Stress: Not at all  Relationships  . Social connections    Talks on phone: More than three times a week    Gets together: More than three times a week    Attends religious service: More than 4 times per year    Active member of club or organization: Yes    Attends meetings of clubs or organizations: More than 4 times per year    Relationship status: Widowed  Other Topics Concern  . Not on file  Social History Narrative   Lives at home alone. Has contact with son daily and has dinner with  him weekly. [place]      Family History: The patient's family history includes Emphysema in his father and mother; Stroke in his mother.  ROS:   Please see the history of present illness.    Appetite is stable.  No significant swelling.  Denies orthopnea.  Not having angina.  Denies nitroglycerin use.  All other systems reviewed and are negative.  EKGs/Labs/Other Studies Reviewed:    The following studies were reviewed today: No new data  EKG:  EKG AV sequential pacing/atrial tracking.  Normal function.  No change compared to prior  Recent Labs: 07/19/2019: ALT 19; BUN 20; Creatinine, Ser 1.01; Hemoglobin 11.5; Platelets 345; Potassium 4.5; Sodium 141; TSH 2.800  Recent Lipid Panel    Component Value Date/Time   CHOL 157 02/22/2019 1020   TRIG 65 02/22/2019 1020   TRIG 85 01/20/2016 0958   HDL 79 02/22/2019 1020   HDL 69 01/20/2016 0958   CHOLHDL 2.0 02/22/2019 1020   CHOLHDL 3.3 04/27/2016 1133   VLDL 16 04/27/2016 1133   LDLCALC 65 02/22/2019 1020   LDLCALC 99 05/21/2014 1101    Physical Exam:    VS:  BP (!) 152/84   Pulse 64   Ht 5\' 10"  (1.778 m)   Wt 189 lb 6.4 oz (85.9 kg)   SpO2 95%   BMI 27.18 kg/m     Wt Readings from Last 3 Encounters:  08/21/19 189 lb 6.4 oz (85.9 kg)  07/19/19 187 lb (84.8 kg)  01/15/19 185 lb (83.9 kg)     GEN: Elderly but not frail. No acute distress HEENT: Normal NECK: No JVD. LYMPHATICS: No lymphadenopathy CARDIAC:  RRR without murmur, gallop, or edema. VASCULAR:  Normal Pulses. No bruits. RESPIRATORY: Cellophane-like rales throughout both mid lung fields.   rhonchi  ABDOMEN: Soft, non-tender, non-distended, No pulsatile mass, MUSCULOSKELETAL: No deformity  SKIN: Warm and dry NEUROLOGIC:  Alert and oriented x 3 PSYCHIATRIC:  Normal affect   ASSESSMENT:    1. Coronary artery disease of native artery of native heart with stable angina pectoris ([place])  2. Second degree AV block   3. Chronic  diastolic heart failure ([place])   4. Essential hypertension   5. Hypercholesterolemia   6. Pacemaker infection, subsequent encounter   7. Educated about COVID-19 virus infection    PLAN:    In order of problems listed above:  1. We discussed secondary prevention.  He is not having angina. 2. Pacemaker function is normal 3. May have volume overload.  BMP will be done today.  If BNP is not elevated he will need to have a chest x-ray to rule out pulmonary fibrosis.  Last chest x-ray done less than a year ago did not reveal any evidence of interstitial lung disease.   4. The blood pressure is relatively elevated.  This could be a source of shortness of breath.  After BNP is back we will determine if adding low-dose Lasix to his current regimen would be helpful. 5. LDL is 65 when evaluated in June. 6. Normal pacemaker function 7. The 3W's is being observed and practiced to avoid COVID-19 infection.   Medication Adjustments/Labs and Tests Ordered: Current medicines are reviewed at length with the patient today.  Concerns regarding medicines are outlined above.  No orders of the defined types were placed in this encounter.  No orders of the defined types were placed in this encounter.   There are no Patient Instructions on file for this visit.   Signed, Joshua Grooms, MD  08/21/2019 4:27 PM    [place]

## 2019-08-21 ENCOUNTER — Other Ambulatory Visit: Payer: Self-pay

## 2019-08-21 ENCOUNTER — Encounter: Payer: Self-pay | Admitting: Interventional Cardiology

## 2019-08-21 ENCOUNTER — Ambulatory Visit (INDEPENDENT_AMBULATORY_CARE_PROVIDER_SITE_OTHER): Payer: Medicare Other | Admitting: Interventional Cardiology

## 2019-08-21 VITALS — BP 152/84 | HR 64 | Ht 70.0 in | Wt 189.4 lb

## 2019-08-21 DIAGNOSIS — I441 Atrioventricular block, second degree: Secondary | ICD-10-CM | POA: Diagnosis not present

## 2019-08-21 DIAGNOSIS — T827XXD Infection and inflammatory reaction due to other cardiac and vascular devices, implants and grafts, subsequent encounter: Secondary | ICD-10-CM | POA: Diagnosis not present

## 2019-08-21 DIAGNOSIS — I25118 Atherosclerotic heart disease of native coronary artery with other forms of angina pectoris: Secondary | ICD-10-CM

## 2019-08-21 DIAGNOSIS — E7800 Pure hypercholesterolemia, unspecified: Secondary | ICD-10-CM

## 2019-08-21 DIAGNOSIS — Z7189 Other specified counseling: Secondary | ICD-10-CM | POA: Diagnosis not present

## 2019-08-21 DIAGNOSIS — I2510 Atherosclerotic heart disease of native coronary artery without angina pectoris: Secondary | ICD-10-CM | POA: Diagnosis not present

## 2019-08-21 DIAGNOSIS — I10 Essential (primary) hypertension: Secondary | ICD-10-CM | POA: Diagnosis not present

## 2019-08-21 DIAGNOSIS — I5032 Chronic diastolic (congestive) heart failure: Secondary | ICD-10-CM

## 2019-08-21 NOTE — Patient Instructions (Signed)
Medication Instructions:  Your physician recommends that you continue on your current medications as directed. Please refer to the Current Medication list given to you today.  *If you need a refill on your cardiac medications before your next appointment, please call your pharmacy*  Lab Work: BMET, CBC, Pro BNP, and liver today  If you have labs (blood work) drawn today and your tests are completely normal, you will receive your results only by: Marland Kitchen MyChart Message (if you have MyChart) OR . A paper copy in the mail If you have any lab test that is abnormal or we need to change your treatment, we will call you to review the results.  Testing/Procedures: None  Follow-Up: At [facility], you and your health needs are our priority.  As part of our continuing mission to provide you with exceptional heart care, we have created designated Provider Care Teams.  These Care Teams include your primary Cardiologist (physician) and Advanced Practice Providers (APPs -  Physician Assistants and Nurse Practitioners) who all work together to provide you with the care you need, when you need it.  Your next appointment:   6 month(s)  The format for your next appointment:   In Person  Provider:   You may see Sinclair Grooms, MD or one of the following Advanced Practice Providers on your designated Care Team:    Truitt Merle, NP  Cecilie Kicks, NP  Kathyrn Drown, NP   Other Instructions

## 2019-08-22 LAB — BASIC METABOLIC PANEL WITH GFR
BUN/Creatinine Ratio: 15 (ref 10–24)
BUN: 13 mg/dL (ref 8–27)
CO2: 22 mmol/L (ref 20–29)
Calcium: 9.2 mg/dL (ref 8.6–10.2)
Chloride: 105 mmol/L (ref 96–106)
Creatinine, Ser: 0.89 mg/dL (ref 0.76–1.27)
GFR calc Af Amer: 90 mL/min/{1.73_m2}
GFR calc non Af Amer: 78 mL/min/{1.73_m2}
Glucose: 84 mg/dL (ref 65–99)
Potassium: 4.9 mmol/L (ref 3.5–5.2)
Sodium: 139 mmol/L (ref 134–144)

## 2019-08-22 LAB — CBC
Hematocrit: 34.5 % — ABNORMAL LOW (ref 37.5–51.0)
Hemoglobin: 11.4 g/dL — ABNORMAL LOW (ref 13.0–17.7)
MCH: 30.2 pg (ref 26.6–33.0)
MCHC: 33 g/dL (ref 31.5–35.7)
MCV: 91 fL (ref 79–97)
Platelets: 332 10*3/uL (ref 150–450)
RBC: 3.78 x10E6/uL — ABNORMAL LOW (ref 4.14–5.80)
RDW: 15.3 % (ref 11.6–15.4)
WBC: 10.5 10*3/uL (ref 3.4–10.8)

## 2019-08-22 LAB — HEPATIC FUNCTION PANEL
ALT: 21 [IU]/L (ref 0–44)
AST: 31 [IU]/L (ref 0–40)
Albumin: 4.4 g/dL (ref 3.6–4.6)
Alkaline Phosphatase: 145 [IU]/L — ABNORMAL HIGH (ref 39–117)
Bilirubin Total: 0.3 mg/dL (ref 0.0–1.2)
Bilirubin, Direct: 0.11 mg/dL (ref 0.00–0.40)
Total Protein: 6.9 g/dL (ref 6.0–8.5)

## 2019-08-22 LAB — PRO B NATRIURETIC PEPTIDE: NT-Pro BNP: 424 pg/mL (ref 0–486)

## 2019-08-22 NOTE — Progress Notes (Signed)
Pt has been made aware of normal result and verbalized understanding.  jw

## 2019-09-10 ENCOUNTER — Ambulatory Visit (INDEPENDENT_AMBULATORY_CARE_PROVIDER_SITE_OTHER): Payer: Medicare Other | Admitting: *Deleted

## 2019-09-10 DIAGNOSIS — I441 Atrioventricular block, second degree: Secondary | ICD-10-CM

## 2019-09-10 LAB — CUP PACEART REMOTE DEVICE CHECK
Battery Remaining Longevity: 103 mo
Battery Voltage: 3.01 V
Brady Statistic AP VP Percent: 52.86 %
Brady Statistic AP VS Percent: 0.04 %
Brady Statistic AS VP Percent: 46.15 %
Brady Statistic AS VS Percent: 0.95 %
Brady Statistic RA Percent Paced: 53.01 %
Brady Statistic RV Percent Paced: 99.01 %
Date Time Interrogation Session: 20201222140733
Implantable Lead Implant Date: 20190509
Implantable Lead Implant Date: 20190509
Implantable Lead Location: 753859
Implantable Lead Location: 753860
Implantable Lead Model: 3830
Implantable Lead Model: 5076
Implantable Pulse Generator Implant Date: 20190509
Lead Channel Impedance Value: 266 Ohm
Lead Channel Impedance Value: 342 Ohm
Lead Channel Impedance Value: 494 Ohm
Lead Channel Impedance Value: 570 Ohm
Lead Channel Pacing Threshold Amplitude: 1.125 V
Lead Channel Pacing Threshold Amplitude: 1.5 V
Lead Channel Pacing Threshold Pulse Width: 0.4 ms
Lead Channel Pacing Threshold Pulse Width: 0.4 ms
Lead Channel Sensing Intrinsic Amplitude: 0.625 mV
Lead Channel Sensing Intrinsic Amplitude: 0.625 mV
Lead Channel Sensing Intrinsic Amplitude: 4 mV
Lead Channel Sensing Intrinsic Amplitude: 4 mV
Lead Channel Setting Pacing Amplitude: 2.25 V
Lead Channel Setting Pacing Amplitude: 3 V
Lead Channel Setting Pacing Pulse Width: 0.4 ms
Lead Channel Setting Sensing Sensitivity: 0.9 mV

## 2019-10-03 DIAGNOSIS — H353231 Exudative age-related macular degeneration, bilateral, with active choroidal neovascularization: Secondary | ICD-10-CM | POA: Diagnosis not present

## 2019-10-22 ENCOUNTER — Other Ambulatory Visit: Payer: Self-pay | Admitting: Family Medicine

## 2019-11-14 ENCOUNTER — Ambulatory Visit: Payer: Medicare Other | Admitting: Nurse Practitioner

## 2019-11-14 ENCOUNTER — Ambulatory Visit: Payer: Medicare Other | Admitting: Family Medicine

## 2019-11-18 ENCOUNTER — Ambulatory Visit (INDEPENDENT_AMBULATORY_CARE_PROVIDER_SITE_OTHER): Payer: Medicare Other | Admitting: *Deleted

## 2019-11-18 DIAGNOSIS — Z0000 Encounter for general adult medical examination without abnormal findings: Secondary | ICD-10-CM | POA: Diagnosis not present

## 2019-11-18 NOTE — Progress Notes (Signed)
MEDICARE ANNUAL WELLNESS VISIT  11/18/2019  Telephone Visit Disclaimer This Medicare AWV was conducted by telephone due to national recommendations for restrictions regarding the COVID-19 Pandemic (e.g. social distancing).  I verified, using two identifiers, that I am speaking with Joshua [name] or their authorized healthcare agent. I discussed the limitations, risks, security, and privacy concerns of performing an evaluation and management service by telephone and the potential availability of an in-person appointment in the future. The patient expressed understanding and agreed to proceed.   Subjective:  Joshua [name] is a 86 y.o. male patient of Rakes, Connye Burkitt, [place] who had a Medicare Annual Wellness Visit today via telephone. Joshua [name] is Working part time and Retired and lives alone. He has 2 adult children. He reports that he is socially active and does interact with friends/family regularly. He is minimally physically active and enjoys reading, and watching tv.  Patient Care Team: Baruch Gouty, FNP as PCP - General (Family Medicine) Belva Crome, MD as PCP - Cardiology (Cardiology) Gatha Mayer, MD as Consulting Physician (Gastroenterology) Gerarda Fraction, MD as Referring Physician (Ophthalmology) [name], MD as Attending Physician (Urology) Ilean China, RN as Case Manager  Advanced Directives 11/18/2019 11/15/2018 01/22/2018 01/22/2018 06/15/2017 02/10/2017 01/30/2017  Does Patient Have a Medical Advance Directive? Yes Yes Yes Yes Yes Yes Yes  Type of Paramedic of [place];Living will Living will [place];Living will [place];Living will [place];Living will - -  Does patient want to make changes to medical advance directive? - No - Patient declined No - Patient declined No - Patient declined No - Patient declined - -  Copy of [place] in Chart? No - copy requested - - No -  copy requested No - copy requested - -  Would patient like information on creating a medical advance directive? - - - - - - -  Pre-existing out of facility DNR order (yellow form or pink MOST form) - - - - - - -    Hospital Utilization Over the Past 12 Months: # of hospitalizations or ER visits: 0 # of surgeries: 0  Review of Systems    Patient reports that his overall health is unchanged compared to last year.  History obtained from chart review and the patient  Patient Reported Readings (BP, Pulse, CBG, Weight, etc) Patients home bp reading 140/56  Pain Assessment Pain : No/denies pain     Current Medications & Allergies (verified) Allergies as of 11/18/2019      Reactions   Penicillins Rash   Has patient had a PCN reaction causing immediate rash, facial/tongue/throat swelling, SOB or lightheadedness with hypotension: ##Yes## Has patient had a PCN reaction causing severe rash involving mucus membranes or skin necrosis: No Has patient had a PCN reaction that required hospitalization No Has patient had a PCN reaction occurring within the last 10 years: No If all of the above answers are "NO", then may proceed with Cephalosporin use.      Medication List       Accurate as of November 18, 2019  2:45 PM. If you have any questions, ask your nurse or doctor.        atorvastatin 20 MG tablet Commonly known as: LIPITOR Take 1 tablet (20 mg total) by mouth daily.   calcium carbonate 1500 (600 Ca) MG Tabs tablet Commonly known as: OSCAL Take 600 mg of elemental calcium by mouth daily  with breakfast.   citalopram 20 MG tablet Commonly known as: CELEXA Take 1 tablet (20 mg total) by mouth daily.   clopidogrel 75 MG tablet Commonly known as: PLAVIX Take 1 tablet (75 mg total) by mouth daily.   ferrous sulfate 325 (65 FE) MG tablet Take 1 tablet (325 mg total) by mouth daily with breakfast.   MAGNESIUM PO Take 1 tablet by mouth at bedtime.   PHENobarbital 64.8 MG  tablet Commonly known as: LUMINAL Take 1 tablet (64.8 mg total) by mouth 2 (two) times daily.   phenytoin 100 MG ER capsule Commonly known as: DILANTIN TAKE (2) CAPSULES TWICE DAILY.   polyvinyl alcohol 1.4 % ophthalmic solution Commonly known as: LIQUIFILM TEARS 1 drop as needed.   PreserVision AREDS 2 Caps Take 1 capsule by mouth 2 (two) times daily.   ranitidine 75 MG tablet Commonly known as: ZANTAC Take 75 mg by mouth 2 (two) times daily.   spironolactone 25 MG tablet Commonly known as: ALDACTONE Take 1 tablet (25 mg total) by mouth daily.   Vitamin D3 25 MCG (1000 UT) Caps Take 1 capsule by mouth daily.       History (reviewed): Past Medical History:  Diagnosis Date  . 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 Arthritis   . Bilateral lower extremity edema   . Bladder tumor   . CAD (coronary artery disease) cardiologist--  dr Daneen Schick   a. 04/27/2016 PCI with DES to RCA with 50% ostial to 60% segmental mid to distal left main, and 90% thrombus filled ostial to proximal circumflex, with distal right coronary filled by collaterals from left-to-right  . COPD with emphysema ([place])   . Dyspnea on exertion   . ED (erectile dysfunction)   . First degree AV block   . GERD (gastroesophageal reflux disease)   . History of adenomatous polyp of colon    09-22-2008  . History of bladder cancer    s/p  resection bladder tumor 04-19-2016  non-invasive low-grade urothelial carcinoma  . History of prostate cancer urologist-  dr Jeffie Pollock-- last PSA 0.02 (summer 2017)   dx 02/ 2013-- Stage T2a, Gleason 7, PSA 1.58--  s/p  radioactive prostate seed implants 01-19-2012  . History of ST elevation myocardial infarction (STEMI)    04-27-2016  . Hyperlipidemia   . Hypertension   . Macular degeneration   . Presence of permanent cardiac pacemaker   . S/P drug eluting coronary stent placement    04-27-2016   . Second degree Mobitz I  AV block    occasional w/ first degree heart block  per cardiologist note by dr Daneen Schick  . Seizures ([place]) per pt son -- no seizure's since 1980   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  arteriorvenous cerebral malformation s/p  craniotomy 1980  . Vitamin D deficiency    Past Surgical History:  Procedure Laterality Date  . APPENDECTOMY  2011  . BLEPHAROPLASTY Bilateral revision 02-17-2016  . CARDIAC CATHETERIZATION N/A 04/27/2016   Procedure: Left Heart Cath and Coronary Angiography;  Surgeon: Belva Crome, MD;  Location: [place] [facility];  Service: Cardiovascular;  Laterality: N/A;  acute inferior wall STEMI ;  ostCx to midCx 90%,  ost1st Mrg to 1st Mrg 50%,  mid to distal RCA 100%,  pRCA 80%,  LVEF 45-50% w/ inferobasal akinesis  . CARDIAC CATHETERIZATION N/A 04/27/2016   Procedure: Temporary Pacemaker;  Surgeon: Belva Crome, MD;  Location: [place] [facility];  Service: Cardiovascular;  Laterality: N/A;  mobitz 2  second degree HB w/ heart rate 29bpm  . CARDIAC CATHETERIZATION N/A 04/27/2016   Procedure: Coronary Stent Intervention;  Surgeon: Belva Crome, MD;  Location: [place] [facility];  Service: Cardiovascular;  Laterality: N/A; DES x1 to  Proximal RCA;  DES x1 to Mid RCA  . CATARACT EXTRACTION W/ INTRAOCULAR LENS  IMPLANT, BILATERAL  2016  approx.  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 CYSTOSCOPY  01/19/2012   Procedure: CYSTOSCOPY;  Surgeon: Malka So, MD;  Location: [facility];  Service: Urology;;  no seeds found in bladder  . CYSTOSCOPY W/ RETROGRADES Bilateral 04/19/2016   Procedure: CYSTOSCOPY WITH BILATERAL RETROGRADE PYELOGRAM TRANSURETHRAL RESECTION OF BLADDER TUMOR ;  Surgeon: [name], MD;  Location: [facility];  Service: Urology;  Laterality: Bilateral;  . CYSTOSCOPY WITH BIOPSY N/A 11/10/2016   Procedure: CYSTOSCOPY WITH BIOPSY AND FULGURATION;  Surgeon: [name], MD;  Location: [facility];  Service: Urology;  Laterality: N/A;  . INSERT / REPLACE / REMOVE PACEMAKER  06/15/2017  . LEAD EXTRACTION N/A 01/22/2018   Procedure: LEAD EXTRACTION;  Surgeon: Evans Lance, MD;  Location: [place] [facility];  Service: Cardiovascular;  Laterality: N/A;  . PACEMAKER IMPLANT N/A 06/15/2017   Procedure: Pacemaker Implant;  Surgeon: [name], MD;  Location: [place] [facility];  Service: Cardiovascular;  Laterality: N/A;  . PACEMAKER IMPLANT N/A 01/25/2018   Procedure: PACEMAKER IMPLANT;  Surgeon: Evans Lance, MD;  Location: [place] [facility];  Service: Cardiovascular;  Laterality: N/A;  . PPM GENERATOR REMOVAL N/A 01/22/2018   Procedure: PPM GENERATOR REMOVAL;  Surgeon: Evans Lance, MD;  Location: [place] [facility];  Service: Cardiovascular;  Laterality: N/A;  . RADIOACTIVE SEED IMPLANT  01/19/2012   Procedure: RADIOACTIVE SEED IMPLANT;  Surgeon: Malka So, MD;  Location: [facility];  Service: Urology;  Laterality: N/A;  68 seeds implanted  . TRANSTHORACIC ECHOCARDIOGRAM  06/02/2016   ef 60-65%/  trivial MR/  mild TR   Family History  Problem Relation Age of Onset  . Emphysema Mother        age 54 deceased  . Stroke Mother   . Emphysema Father        deceased age 76   Social History   Socioeconomic History  . Marital status: Widowed    Spouse name: Not on file  . Number of children: 2  . Years of education: 1 year of college  . Highest education level: Not on file  Occupational History  . Occupation: Retired  Tobacco Use  . Smoking status: Former Smoker    Packs/day: 2.00    Years: 15.00    Pack years: 30.00    Quit date: 01/15/1973    Years since quitting: 46.8  . Smokeless tobacco: Never Used  Substance and Sexual Activity  . Alcohol use: Yes    Comment: occasionally  . Drug use: No  . Sexual activity: Not Currently  Other Topics Concern  . Not on file  Social History Narrative   Lives at home alone. Has contact with son  daily and has dinner with him weekly. [place]    Social Determinants of Health   Financial Resource Strain:   . Difficulty of Paying Living Expenses: Not on file  Food Insecurity:   . Worried About  Running Out of Food in the Last Year: Not on file  . Ran Out of Food in the Last Year: Not on file  Transportation Needs:   . Lack of Transportation (Medical): Not on file  . Lack of Transportation (Non-Medical): Not on file  Physical Activity: Unknown  . Days of Exercise per Week: Not on file  . Minutes of Exercise per Session: 30 min  Stress:   . Feeling of Stress : Not on file  Social Connections:   . Frequency of Communication with Friends and Family: Not on file  . Frequency of Social Gatherings with Friends and Family: Not on file  . Attends Religious Services: Not on file  . Active Member of Clubs or Organizations: Not on file  . Attends Archivist Meetings: Not on file  . Marital Status: Not on file    Activities of Daily Living In your present state of health, do you have any difficulty performing the following activities: 11/18/2019 08/09/2019  Hearing? Y Y  Comment At times has some problems with hearing but does ok  Vision? N N  Comment wears glasses -  Difficulty concentrating or making decisions? N N  Walking or climbing stairs? N N  Dressing or bathing? N N  Doing errands, shopping? N Y  Comment - has some help with errands due to [employer] and eating ? N N  Using the Toilet? N N  In the past six months, have you accidently leaked urine? N N  Do you have problems with loss of bowel control? N N  Managing your Medications? N N  Managing your Finances? N N  Housekeeping or managing your Housekeeping? N N  Some recent data might be hidden    Patient Education/ Literacy How often do you need to have someone help you when you read instructions, pamphlets, or other written materials from your doctor or pharmacy?: 1 -  Never What is the last grade level you completed in school?: 1 year of college  Exercise Current Exercise Habits: The patient does not participate in regular exercise at present, Exercise limited by: Other - see comments(age)  Diet Patient reports consuming 3 meals a day and 1 snack(s) a day Patient reports that his primary diet is: Regular Patient reports that she does have regular access to food.   Depression Screen PHQ 2/9 Scores 11/18/2019 07/19/2019 11/15/2018 10/02/2018 05/10/2018 12/12/2017 08/18/2017  PHQ - 2 Score 0 0 0 0 0 0 0     Fall Risk Fall Risk  11/18/2019 07/19/2019 11/15/2018 10/02/2018 05/10/2018  Falls in the past year? 1 1 1  0 Yes  Number falls in past yr: 1 1 1  - 2 or more  Injury with Fall? 0 0 0 - No  Comment - - - - -  Risk Factor Category  - - - - -  Risk for fall due to : History of fall(s) - Impaired balance/gait;Impaired mobility - -  Follow up Falls prevention discussed - Education provided;Falls prevention discussed - -     Objective:  Joshua [name] seemed alert and oriented and he participated appropriately during our telephone visit.  Blood Pressure Weight BMI  BP Readings from Last 3 Encounters:  08/21/19 (!) 152/84  07/19/19 139/66  01/15/19 (!) 148/67   Wt Readings from Last 3 Encounters:  08/21/19 189 lb 6.4 oz (85.9 kg)  07/19/19 187 lb (84.8 kg)  01/15/19 185 lb (83.9 kg)   BMI Readings from Last 1 Encounters:  08/21/19 27.18 kg/m    *Unable to obtain current vital signs, weight, and BMI due to telephone visit type  Hearing/Vision  . Joshua [name] did not seem to have difficulty with hearing/understanding during the telephone conversation . Reports that he has not had a formal eye exam by an eye care professional within the past year . Reports that he has had a formal hearing evaluation within the past year *Unable to fully assess hearing and vision during telephone visit type  Cognitive Function: 6CIT Screen 11/18/2019  What Year? 0 points   What month? 0 points  What time? 0 points  Count back from 20 0 points  Months in reverse 0 points  Repeat phrase 0 points  Total Score 0   (Normal:0-7, Significant for Dysfunction: >8)  Normal Cognitive Function Screening: Yes   Immunization & Health Maintenance Record Immunization History  Administered Date(s) Administered  . Fluad Quad(high Dose 65+) 07/01/2019  . Influenza, High Dose Seasonal PF 07/08/2016, 08/18/2017, 08/29/2018  . Influenza,inj,Quad PF,6+ Mos 07/08/2013, 09/10/2014, 08/26/2015  . Pneumococcal Conjugate-13 07/08/2013  . Pneumococcal Polysaccharide-23 08/18/2017  . Td 12/11/2000, 08/31/2011  . Tdap 08/31/2011    Health Maintenance  Topic Date Due  . Samul Dada  08/30/2021  . INFLUENZA VACCINE  Completed  . PNA vac Low Risk Adult  Completed       Assessment  This is a routine wellness examination for Joshua [name].  Health Maintenance: Due or Overdue There are no preventive care reminders to display for this patient.  Joshua [name] does not need a referral for Community Assistance: Care Management:   no Social Work:    no Prescription Assistance:  no Nutrition/Diabetes Education:  no   Plan:  Personalized Goals Goals Addressed   None    Personalized Health Maintenance & Screening Recommendations  Up to date  Lung Cancer Screening Recommended: no (Low Dose CT Chest recommended if Age 18-80 years, 30 pack-year currently smoking OR have quit w/in past 15 years) Hepatitis C Screening recommended: no HIV Screening recommended: no  Advanced Directives: Written information was not prepared per patient's request.  Referrals & Orders No orders of the defined types were placed in this encounter.   Follow-up Plan . Follow-up with Baruch Gouty, FNP as planned . Schedule your yearly eye exam   I have personally reviewed and noted the following in the patient's chart:   . Medical and social history . Use of alcohol, tobacco or  illicit drugs  . Current medications and supplements . Functional ability and status . Nutritional status . Physical activity . Advanced directives . List of other physicians . Hospitalizations, surgeries, and ER visits in previous 12 months . Vitals . Screenings to include cognitive, depression, and falls . Referrals and appointments  In addition, I have reviewed and discussed with Joshua [name] certain preventive protocols, quality metrics, and best practice recommendations. A written personalized care plan for preventive services as well as general preventive health recommendations is available and can be mailed to the patient at his request.      Gareth Morgan  11/18/2019

## 2019-11-20 DIAGNOSIS — D225 Melanocytic nevi of trunk: Secondary | ICD-10-CM | POA: Diagnosis not present

## 2019-11-20 DIAGNOSIS — L570 Actinic keratosis: Secondary | ICD-10-CM | POA: Diagnosis not present

## 2019-11-20 DIAGNOSIS — L820 Inflamed seborrheic keratosis: Secondary | ICD-10-CM | POA: Diagnosis not present

## 2019-11-20 DIAGNOSIS — L821 Other seborrheic keratosis: Secondary | ICD-10-CM | POA: Diagnosis not present

## 2019-11-21 DIAGNOSIS — Z9842 Cataract extraction status, left eye: Secondary | ICD-10-CM | POA: Diagnosis not present

## 2019-11-21 DIAGNOSIS — H04123 Dry eye syndrome of bilateral lacrimal glands: Secondary | ICD-10-CM | POA: Diagnosis not present

## 2019-11-21 DIAGNOSIS — Z961 Presence of intraocular lens: Secondary | ICD-10-CM | POA: Diagnosis not present

## 2019-11-21 DIAGNOSIS — H353231 Exudative age-related macular degeneration, bilateral, with active choroidal neovascularization: Secondary | ICD-10-CM | POA: Diagnosis not present

## 2019-11-21 DIAGNOSIS — Z9841 Cataract extraction status, right eye: Secondary | ICD-10-CM | POA: Diagnosis not present

## 2019-11-22 ENCOUNTER — Other Ambulatory Visit: Payer: Self-pay

## 2019-11-25 ENCOUNTER — Ambulatory Visit (INDEPENDENT_AMBULATORY_CARE_PROVIDER_SITE_OTHER): Payer: Medicare Other | Admitting: Family Medicine

## 2019-11-25 ENCOUNTER — Encounter: Payer: Self-pay | Admitting: Family Medicine

## 2019-11-25 ENCOUNTER — Other Ambulatory Visit: Payer: Self-pay

## 2019-11-25 ENCOUNTER — Ambulatory Visit (INDEPENDENT_AMBULATORY_CARE_PROVIDER_SITE_OTHER): Payer: Medicare Other

## 2019-11-25 VITALS — BP 128/60 | HR 59 | Temp 99.0°F | Resp 20 | Ht 70.0 in | Wt 182.0 lb

## 2019-11-25 DIAGNOSIS — R0609 Other forms of dyspnea: Secondary | ICD-10-CM

## 2019-11-25 DIAGNOSIS — E7800 Pure hypercholesterolemia, unspecified: Secondary | ICD-10-CM | POA: Diagnosis not present

## 2019-11-25 DIAGNOSIS — E559 Vitamin D deficiency, unspecified: Secondary | ICD-10-CM

## 2019-11-25 DIAGNOSIS — I700 Atherosclerosis of aorta: Secondary | ICD-10-CM

## 2019-11-25 DIAGNOSIS — R0600 Dyspnea, unspecified: Secondary | ICD-10-CM

## 2019-11-25 DIAGNOSIS — K219 Gastro-esophageal reflux disease without esophagitis: Secondary | ICD-10-CM

## 2019-11-25 DIAGNOSIS — I5032 Chronic diastolic (congestive) heart failure: Secondary | ICD-10-CM

## 2019-11-25 DIAGNOSIS — J849 Interstitial pulmonary disease, unspecified: Secondary | ICD-10-CM | POA: Diagnosis not present

## 2019-11-25 DIAGNOSIS — I10 Essential (primary) hypertension: Secondary | ICD-10-CM

## 2019-11-25 DIAGNOSIS — R569 Unspecified convulsions: Secondary | ICD-10-CM | POA: Diagnosis not present

## 2019-11-25 NOTE — Progress Notes (Signed)
Subjective:  Patient ID: Joshua [name], male    DOB: [birth year], 86 y.o.   MRN: [id number]  Patient Care Team: Baruch Gouty, FNP as PCP - General (Family Medicine) Belva Crome, MD as PCP - Cardiology (Cardiology) Gatha Mayer, MD as Consulting Physician (Gastroenterology) Gerarda Fraction, MD as Referring Physician (Ophthalmology) [name], MD as Attending Physician (Urology) Ilean China, RN as Case Manager   Chief Complaint:  Medical Management of Chronic Issues (6 MO ), Hyperlipidemia, and Seizures   HPI: Joshua [name] is a 86 y.o. male presenting on 11/25/2019 for Medical Management of Chronic Issues (6 MO ), Hyperlipidemia, and Seizures   1. Essential hypertension Compliant with medications and reports great control of blood pressure at home. He does report some DOE. States getting worse over the last several months. Was seen by cardiology in December and no changes were made to medication regimen. No headaches, weakness, or dizziness. No PND or orthopnea. Denies chest pain or palpitations.   2. Seizures ([place]) Currently on phenobarbital and phenytoin for prevention. Has not had seizure activity in over 20 years. Doing well on medications without associated side effects.   3. Hypercholesterolemia On statin therapy and tolerating well. Does watch diet and stays active.   4. Gastroesophageal reflux disease without esophagitis Reports well control of symptoms. Takes OTC medications if needed for symptom management which is not often.   5. Vitamin D deficiency Pt is taking oral repletion therapy. Denies bone pain and tenderness, muscle weakness, fracture, and difficulty walking.  6. Aortic atherosclerosis ([place]) On statin therapy. No chest pain, palpitations, or abdominal pain.   7. Chronic diastolic heart failure ([place]) 8. DOE (dyspnea on exertion) Pt reports worsening DOE. States he gets very winded when walking short distances. He was seen by cardiology in  December and medications were not changed. He denies chest pain, palpitations, orthopnea, or PND. No leg swelling or decreased urine output.      Relevant past medical, surgical, family, and social history reviewed and updated as indicated.  Allergies and medications reviewed and updated. Date reviewed: Chart in Epic.   Past Medical History:  Diagnosis Date  . 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 Arthritis   . Bilateral lower extremity edema   . Bladder tumor   . CAD (coronary artery disease) cardiologist--  dr Daneen Schick   a. 04/27/2016 PCI with DES to RCA with 50% ostial to 60% segmental mid to distal left main, and 90% thrombus filled ostial to proximal circumflex, with distal right coronary filled by collaterals from left-to-right  . COPD with emphysema ([place])   . Dyspnea on exertion   . ED (erectile dysfunction)   . First degree AV block   . GERD (gastroesophageal reflux disease)   . History of adenomatous polyp of colon    09-22-2008  . History of bladder cancer    s/p  resection bladder tumor 04-19-2016  non-invasive low-grade urothelial carcinoma  . History of prostate cancer urologist-  dr Jeffie Pollock-- last PSA 0.02 (summer 2017)   dx 02/ 2013-- Stage T2a, Gleason 7, PSA 1.58--  s/p  radioactive prostate seed implants 01-19-2012  . History of ST elevation myocardial infarction (STEMI)    04-27-2016  . Hyperlipidemia   . Hypertension   . Macular degeneration   . Presence of permanent cardiac pacemaker   . S/P drug eluting coronary stent placement    04-27-2016   .  Second degree Mobitz I AV block    occasional w/ first degree heart block  per cardiologist note by dr Daneen Schick  . Seizures ([place]) per pt son -- no seizure's since 1980   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  arteriorvenous cerebral malformation s/p  craniotomy 1980  . Vitamin D deficiency     Past Surgical History:  Procedure Laterality  Date  . APPENDECTOMY  2011  . BLEPHAROPLASTY Bilateral revision 02-17-2016  . CARDIAC CATHETERIZATION N/A 04/27/2016   Procedure: Left Heart Cath and Coronary Angiography;  Surgeon: Belva Crome, MD;  Location: [place] [facility];  Service: Cardiovascular;  Laterality: N/A;  acute inferior wall STEMI ;  ostCx to midCx 90%,  ost1st Mrg to 1st Mrg 50%,  mid to distal RCA 100%,  pRCA 80%,  LVEF 45-50% w/ inferobasal akinesis  . CARDIAC CATHETERIZATION N/A 04/27/2016   Procedure: Temporary Pacemaker;  Surgeon: Belva Crome, MD;  Location: [place] [facility];  Service: Cardiovascular;  Laterality: N/A;  mobitz 2  second degree HB w/ heart rate 29bpm  . CARDIAC CATHETERIZATION N/A 04/27/2016   Procedure: Coronary Stent Intervention;  Surgeon: Belva Crome, MD;  Location: [place] [facility];  Service: Cardiovascular;  Laterality: N/A; DES x1 to  Proximal RCA;  DES x1 to Mid RCA  . CATARACT EXTRACTION W/ INTRAOCULAR LENS  IMPLANT, BILATERAL  2016  approx.  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 CYSTOSCOPY  01/19/2012   Procedure: CYSTOSCOPY;  Surgeon: Malka So, MD;  Location: [facility];  Service: Urology;;  no seeds found in bladder  . CYSTOSCOPY W/ RETROGRADES Bilateral 04/19/2016   Procedure: CYSTOSCOPY WITH BILATERAL RETROGRADE PYELOGRAM TRANSURETHRAL RESECTION OF BLADDER TUMOR ;  Surgeon: [name], MD;  Location: [facility];  Service: Urology;  Laterality: Bilateral;  . CYSTOSCOPY WITH BIOPSY N/A 11/10/2016   Procedure: CYSTOSCOPY WITH BIOPSY AND FULGURATION;  Surgeon: [name], MD;  Location: [facility];  Service: Urology;  Laterality: N/A;  . INSERT / REPLACE / REMOVE PACEMAKER  06/15/2017  . LEAD EXTRACTION N/A 01/22/2018   Procedure: LEAD EXTRACTION;  Surgeon: Evans Lance, MD;  Location: [place] [facility];  Service: Cardiovascular;  Laterality: N/A;  . PACEMAKER IMPLANT N/A 06/15/2017   Procedure: Pacemaker  Implant;  Surgeon: [name], MD;  Location: [place] [facility];  Service: Cardiovascular;  Laterality: N/A;  . PACEMAKER IMPLANT N/A 01/25/2018   Procedure: PACEMAKER IMPLANT;  Surgeon: Evans Lance, MD;  Location: [place] [facility];  Service: Cardiovascular;  Laterality: N/A;  . PPM GENERATOR REMOVAL N/A 01/22/2018   Procedure: PPM GENERATOR REMOVAL;  Surgeon: Evans Lance, MD;  Location: [place] [facility];  Service: Cardiovascular;  Laterality: N/A;  . RADIOACTIVE SEED IMPLANT  01/19/2012   Procedure: RADIOACTIVE SEED IMPLANT;  Surgeon: Malka So, MD;  Location: [facility];  Service: Urology;  Laterality: N/A;  68 seeds implanted  . TRANSTHORACIC ECHOCARDIOGRAM  06/02/2016   ef 60-65%/  trivial MR/  mild TR    Social History   Socioeconomic History  . Marital status: Widowed    Spouse name: Not on file  . Number of children: 2  . Years of education: 1 year of college  . Highest education level: Not on file  Occupational History  . Occupation: Retired  Tobacco Use  . Smoking status: Former Smoker    Packs/day: 2.00  Years: 15.00    Pack years: 30.00    Quit date: 01/15/1973    Years since quitting: 46.8  . Smokeless tobacco: Never Used  Substance and Sexual Activity  . Alcohol use: Yes    Comment: occasionally  . Drug use: No  . Sexual activity: Not Currently  Other Topics Concern  . Not on file  Social History Narrative   Lives at home alone. Has contact with son daily and has dinner with him weekly. [place]    Social Determinants of Health   Financial Resource Strain:   . Difficulty of Paying Living Expenses: Not on file  Food Insecurity:   . Worried About Charity fundraiser in the Last Year: Not on file  . Ran Out of Food in the Last Year: Not on file  Transportation Needs:   . Lack of Transportation (Medical): Not on file  . Lack of Transportation (Non-Medical): Not on file  Physical Activity: Unknown  . Days  of Exercise per Week: Not on file  . Minutes of Exercise per Session: 30 min  Stress:   . Feeling of Stress : Not on file  Social Connections:   . Frequency of Communication with Friends and Family: Not on file  . Frequency of Social Gatherings with Friends and Family: Not on file  . Attends Religious Services: Not on file  . Active Member of Clubs or Organizations: Not on file  . Attends Archivist Meetings: Not on file  . Marital Status: Not on file  Intimate Partner Violence:   . Fear of Current or Ex-Partner: Not on file  . Emotionally Abused: Not on file  . Physically Abused: Not on file  . Sexually Abused: Not on file    Outpatient Encounter Medications as of 11/25/2019  Medication Sig  . atorvastatin (LIPITOR) 20 MG tablet Take 1 tablet (20 mg total) by mouth daily.  . calcium carbonate (OSCAL) 1500 (600 Ca) MG TABS tablet Take 600 mg of elemental calcium by mouth daily with breakfast.  . Cholecalciferol (VITAMIN D3) 1000 units CAPS Take 1 capsule by mouth daily.  . citalopram (CELEXA) 20 MG tablet Take 1 tablet (20 mg total) by mouth daily.  . clopidogrel (PLAVIX) 75 MG tablet Take 1 tablet (75 mg total) by mouth daily.  . ferrous sulfate 325 (65 FE) MG tablet Take 1 tablet (325 mg total) by mouth daily with breakfast.  . MAGNESIUM PO Take 1 tablet by mouth at bedtime.  . Multiple Vitamins-Minerals (PRESERVISION AREDS 2) CAPS Take 1 capsule by mouth 2 (two) times daily.   Marland Kitchen OVER THE COUNTER MEDICATION Generic heartburn releif  . PHENobarbital (LUMINAL) 64.8 MG tablet Take 1 tablet (64.8 mg total) by mouth 2 (two) times daily.  . phenytoin (DILANTIN) 100 MG ER capsule TAKE (2) CAPSULES TWICE DAILY.  Marland Kitchen polyvinyl alcohol (LIQUIFILM TEARS) 1.4 % ophthalmic solution 1 drop as needed.  Marland Kitchen spironolactone (ALDACTONE) 25 MG tablet Take 1 tablet (25 mg total) by mouth daily.  . [DISCONTINUED] ranitidine (ZANTAC) 75 MG tablet Take 75 mg by mouth 2 (two) times daily.   No  facility-administered encounter medications on file as of 11/25/2019.    Allergies  Allergen Reactions  . Penicillins Rash    Has patient had a PCN reaction causing immediate rash, facial/tongue/throat swelling, SOB or lightheadedness with hypotension: ##Yes## Has patient had a PCN reaction causing severe rash involving mucus membranes or skin necrosis: No Has patient had a PCN reaction  that required hospitalization No Has patient had a PCN reaction occurring within the last 10 years: No If all of the above answers are "NO", then may proceed with Cephalosporin use.     Review of Systems  Constitutional: Negative for activity change, appetite change, chills, diaphoresis, fatigue, fever and unexpected weight change.  HENT: Negative.   Eyes: Negative.  Negative for photophobia and visual disturbance.  Respiratory: Positive for shortness of breath. Negative for cough, chest tightness and wheezing.   Cardiovascular: Negative for chest pain, palpitations and leg swelling.  Gastrointestinal: Negative for abdominal pain, blood in stool, constipation, diarrhea, nausea and vomiting.  Endocrine: Negative.   Genitourinary: Negative for decreased urine volume, difficulty urinating, dysuria, frequency and urgency.       Followed by Dr. Jeffie Pollock for bladder and prostate CA  Musculoskeletal: Positive for arthralgias and gait problem (uses cane). Negative for myalgias.  Skin: Negative.   Allergic/Immunologic: Negative.   Neurological: Positive for weakness (generalized). Negative for dizziness, tremors, seizures, syncope, facial asymmetry, speech difficulty, light-headedness, numbness and headaches.  Hematological: Negative.   Psychiatric/Behavioral: Negative for confusion, hallucinations, sleep disturbance and suicidal ideas.  All other systems reviewed and are negative.       Objective:  BP 128/60   Pulse (!) 59   Temp 99 F (37.2 C)   Resp 20   Ht 5' 10"  (1.778 m)   Wt 182 lb (82.6 kg)   SpO2  96%   BMI 26.11 kg/m    Wt Readings from Last 3 Encounters:  11/25/19 182 lb (82.6 kg)  08/21/19 189 lb 6.4 oz (85.9 kg)  07/19/19 187 lb (84.8 kg)    Physical Exam Vitals and nursing note reviewed.  Constitutional:      General: He is not in acute distress.    Appearance: Normal appearance. He is well-developed and well-groomed. He is not ill-appearing, toxic-appearing or diaphoretic.  HENT:     Head: Normocephalic and atraumatic.     Jaw: There is normal jaw occlusion.     Right Ear: Hearing normal.     Left Ear: Hearing normal.     Nose: Nose normal.     Mouth/Throat:     Lips: Pink.     Mouth: Mucous membranes are moist.     Pharynx: Oropharynx is clear. Uvula midline.  Eyes:     General: Lids are normal.     Extraocular Movements: Extraocular movements intact.     Conjunctiva/sclera: Conjunctivae normal.     Pupils: Pupils are equal, round, and reactive to light.  Neck:     Thyroid: No thyroid mass, thyromegaly or thyroid tenderness.     Vascular: No carotid bruit or JVD.     Trachea: Trachea and phonation normal.  Cardiovascular:     Rate and Rhythm: Normal rate and regular rhythm.     Chest Wall: PMI is not displaced.     Pulses: Normal pulses.     Heart sounds: Normal heart sounds. No murmur. No friction rub. No gallop.   Pulmonary:     Effort: Pulmonary effort is normal. No respiratory distress.     Breath sounds: Rhonchi and rales (mild, bases) present. No wheezing.  Abdominal:     General: Bowel sounds are normal. There is no distension or abdominal bruit.     Palpations: Abdomen is soft. There is no hepatomegaly or splenomegaly.     Tenderness: There is no abdominal tenderness. There is no right CVA tenderness or left CVA tenderness.  Hernia: No hernia is present.  Musculoskeletal:        General: Normal range of motion.     Cervical back: Normal range of motion and neck supple.     Right lower leg: No edema.     Left lower leg: No edema.   Lymphadenopathy:     Cervical: No cervical adenopathy.  Skin:    General: Skin is warm and dry.     Capillary Refill: Capillary refill takes less than 2 seconds.     Coloration: Skin is not cyanotic, jaundiced or pale.     Findings: No rash.  Neurological:     General: No focal deficit present.     Mental Status: He is alert and oriented to person, place, and time.     Cranial Nerves: Cranial nerves are intact. No cranial nerve deficit.     Sensory: Sensation is intact. No sensory deficit.     Motor: Weakness (generalized) present.     Coordination: Coordination is intact. Coordination normal.     Gait: Gait abnormal (antalgic, uses cane).     Deep Tendon Reflexes: Reflexes are normal and symmetric. Reflexes normal.  Psychiatric:        Attention and Perception: Attention and perception normal.        Mood and Affect: Mood and affect normal.        Speech: Speech normal.        Behavior: Behavior normal. Behavior is cooperative.        Thought Content: Thought content normal.        Cognition and Memory: Cognition and memory normal.        Judgment: Judgment normal.     Results for orders placed or performed in visit on 09/10/19  CUP PACEART REMOTE DEVICE CHECK  Result Value Ref Range   Date Time Interrogation Session [id number]    Pulse Generator Manufacturer MERM    Pulse Gen Model [id number] Azure XT DR MRI    Pulse Gen Serial Number [id number]    Clinic Name [facility]    Implantable Pulse Generator Type Implantable Pulse Generator    Implantable Pulse Generator Implant Date [id number]    Implantable Lead Manufacturer MERM    Implantable Lead Model 3830 SelectSecure    Implantable Lead Serial Number [id number]    Implantable Lead Implant Date 94709628    Implantable Lead Location Detail 1 UNKNOWN    Implantable Lead Special Function BUNDLE OF HIS    Implantable Lead Location [id number]    Implantable Lead Manufacturer MERM    Implantable Lead Model 5076 CapSureFix  Novus MRI SureScan    Implantable Lead Serial Number [id number]    Implantable Lead Implant Date [id number]    Implantable Lead Location Detail 1 APPENDAGE    Implantable Lead Location [id number]    Lead Channel Setting Sensing Sensitivity 0.9 mV   Lead Channel Setting Pacing Amplitude 2.25 V   Lead Channel Setting Pacing Pulse Width 0.4 ms   Lead Channel Setting Pacing Amplitude 3 V   Lead Channel Impedance Value 570 ohm   Lead Channel Impedance Value 342 ohm   Lead Channel Sensing Intrinsic Amplitude 0.625 mV   Lead Channel Sensing Intrinsic Amplitude 0.625 mV   Lead Channel Pacing Threshold Amplitude 1.125 V   Lead Channel Pacing Threshold Pulse Width 0.4 ms   Lead Channel Impedance Value 494 ohm   Lead Channel Impedance Value 266 ohm   Lead Channel Sensing Intrinsic Amplitude 4 mV   Lead Channel Sensing  Intrinsic Amplitude 4 mV   Lead Channel Pacing Threshold Amplitude 1.5 V   Lead Channel Pacing Threshold Pulse Width 0.4 ms   Battery Status OK    Battery Remaining Longevity 103 mo   Battery Voltage 3.01 V   Brady Statistic RA Percent Paced 53.01 %   Brady Statistic RV Percent Paced 99.01 %   Brady Statistic AP VP Percent 52.86 %   Brady Statistic AS VP Percent 46.15 %   Brady Statistic AP VS Percent 0.04 %   Brady Statistic AS VS Percent 0.95 %       Pertinent labs & imaging results that were available during my care of the patient were reviewed by me and considered in my medical decision making.  Assessment & Plan:  Joshua [name] was seen today for medical management of chronic issues, hyperlipidemia and seizures.  Diagnoses and all orders for this visit:  Essential hypertension BP well controlled. Changes were not made in regimen. Goal BP is 130/80. Pt aware to report any persistent high or low readings. DASH diet and exercise encouraged. Exercise at least 150 minutes per week and increase as tolerated. Goal BMI > 25. Stress management encouraged. Avoid nicotine and tobacco product  use. Avoid excessive alcohol and NSAID's. Avoid more than 2000 mg of sodium daily. Medications as prescribed. Follow up as scheduled.  -     CBC with Differential/Platelet -     CMP14+EGFR  Seizures ([place]) Doing well on current medications. Will check below today.  -     CBC with Differential/Platelet -     CMP14+EGFR -     Phenytoin level, free and total -     Phenobarbital level  Hypercholesterolemia Diet encouraged - increase intake of fresh fruits and vegetables, increase intake of lean proteins. Bake, broil, or grill foods. Avoid fried, greasy, and fatty foods. Avoid fast foods. Increase intake of fiber-rich whole grains. Exercise encouraged - at least 150 minutes per week and advance as tolerated.  Goal BMI < 25. Continue medications as prescribed. Follow up in 3-6 months as discussed.  -     CBC with Differential/Platelet -     Lipid panel  Gastroesophageal reflux disease without esophagitis No red flags present. Diet discussed. Avoid fried, spicy, fatty, greasy, and acidic foods. Avoid caffeine, nicotine, and alcohol. Do not eat 2-3 hours before bedtime and stay upright for at least 1-2 hours after eating. Eat small frequent meals. Avoid NSAID's like motrin and aleve. Report any new or worsening symptoms. Follow up as discussed or sooner if needed.   -     CBC with Differential/Platelet  Vitamin D deficiency Labs pending. Continue repletion therapy. If indicated, will change repletion dosage. Eat foods rich in Vit D including milk, orange juice, yogurt with vitamin D added, salmon or mackerel, canned tuna fish, cereals with vitamin D added, and cod liver oil. Get out in the sun but make sure to wear at least SPF 30 sunscreen.  -     CBC with Differential/Platelet -     VITAMIN D 25 Hydroxy (Vit-D Deficiency, Fractures)  Aortic atherosclerosis ([place]) Labs pending. Diet and exercise encouraged. Continue statin therapy.  -     CBC with Differential/Platelet -     CMP14+EGFR -      Lipid panel  Chronic diastolic heart failure ([facility]) DOE (dyspnea on exertion) BNP normal in cardiology office. Will get CXR today. Pt will return to office for imaging this afternoon. Treatment depending on imaging results.  -  DG Chest 2 View; Future     Continue all other maintenance medications.  Follow up plan: Return in about 6 months (around 05/27/2020), or if symptoms worsen or fail to improve.  Continue healthy lifestyle choices, including diet (rich in fruits, vegetables, and lean proteins, and low in salt and simple carbohydrates) and exercise (at least 30 minutes of moderate physical activity daily).  Educational handout given for health maintenance   The above assessment and management plan was discussed with the patient. The patient verbalized understanding of and has agreed to the management plan. Patient is aware to call the clinic if they develop any new symptoms or if symptoms persist or worsen. Patient is aware when to return to the clinic for a follow-up visit. Patient educated on when it is appropriate to go to the emergency department.   Monia Pouch, FNP-C [facility] [phone]

## 2019-11-25 NOTE — Patient Instructions (Signed)
Health Maintenance After Age 84 After age 84, you are at a higher risk for certain long-term diseases and infections as well as injuries from falls. Falls are a major cause of broken bones and head injuries in people who are older than age 84. Getting regular preventive care can help to keep you healthy and well. Preventive care includes getting regular testing and making lifestyle changes as recommended by your health care provider. Talk with your health care provider about:  Which screenings and tests you should have. A screening is a test that checks for a disease when you have no symptoms.  A diet and exercise plan that is right for you. What should I know about screenings and tests to prevent falls? Screening and testing are the best ways to find a health problem early. Early diagnosis and treatment give you the best chance of managing medical conditions that are common after age 84. Certain conditions and lifestyle choices may make you more likely to have a fall. Your health care provider may recommend:  Regular vision checks. Poor vision and conditions such as cataracts can make you more likely to have a fall. If you wear glasses, make sure to get your prescription updated if your vision changes.  Medicine review. Work with your health care provider to regularly review all of the medicines you are taking, including over-the-counter medicines. Ask your health care provider about any side effects that may make you more likely to have a fall. Tell your health care provider if any medicines that you take make you feel dizzy or sleepy.  Osteoporosis screening. Osteoporosis is a condition that causes the bones to get weaker. This can make the bones weak and cause them to break more easily.  Blood pressure screening. Blood pressure changes and medicines to control blood pressure can make you feel dizzy.  Strength and balance checks. Your health care provider may recommend certain tests to check your  strength and balance while standing, walking, or changing positions.  Foot health exam. Foot pain and numbness, as well as not wearing proper footwear, can make you more likely to have a fall.  Depression screening. You may be more likely to have a fall if you have a fear of falling, feel emotionally low, or feel unable to do activities that you used to do.  Alcohol use screening. Using too much alcohol can affect your balance and may make you more likely to have a fall. What actions can I take to lower my risk of falls? General instructions  Talk with your health care provider about your risks for falling. Tell your health care provider if: ? You fall. Be sure to tell your health care provider about all falls, even ones that seem minor. ? You feel dizzy, sleepy, or off-balance.  Take over-the-counter and prescription medicines only as told by your health care provider. These include any supplements.  Eat a healthy diet and maintain a healthy weight. A healthy diet includes low-fat dairy products, low-fat (lean) meats, and fiber from whole grains, beans, and lots of fruits and vegetables. Home safety  Remove any tripping hazards, such as rugs, cords, and clutter.  Install safety equipment such as grab bars in bathrooms and safety rails on stairs.  Keep rooms and walkways well-lit. Activity   Follow a regular exercise program to stay fit. This will help you maintain your balance. Ask your health care provider what types of exercise are appropriate for you.  If you need a cane or   walker, use it as recommended by your health care provider.  Wear supportive shoes that have nonskid soles. Lifestyle  Do not drink alcohol if your health care provider tells you not to drink.  If you drink alcohol, limit how much you have: ? 0-1 drink a day for women. ? 0-2 drinks a day for men.  Be aware of how much alcohol is in your drink. In the [place] one drink equals one typical bottle of beer (12  oz), one-half glass of wine (5 oz), or one shot of hard liquor (1 oz).  Do not use any products that contain nicotine or tobacco, such as cigarettes and e-cigarettes. If you need help quitting, ask your health care provider. Summary  Having a healthy lifestyle and getting preventive care can help to protect your health and wellness after age 84.  Screening and testing are the best way to find a health problem early and help you avoid having a fall. Early diagnosis and treatment give you the best chance for managing medical conditions that are more common for people who are older than age 84.  Falls are a major cause of broken bones and head injuries in people who are older than age 84. Take precautions to prevent a fall at home.  Work with your health care provider to learn what changes you can make to improve your health and wellness and to prevent falls. This information is not intended to replace advice given to you by your health care provider. Make sure you discuss any questions you have with your health care provider. Document Revised: 12/27/2018 Document Reviewed: 07/19/2017 Elsevier Patient Education  2020 [employer]vier Inc.  

## 2019-11-26 LAB — CBC WITH DIFFERENTIAL/PLATELET
Basophils Absolute: 0.1 10*3/uL (ref 0.0–0.2)
Basos: 1 %
EOS (ABSOLUTE): 0.5 10*3/uL — ABNORMAL HIGH (ref 0.0–0.4)
Eos: 5 %
Hematocrit: 32.3 % — ABNORMAL LOW (ref 37.5–51.0)
Hemoglobin: 11.4 g/dL — ABNORMAL LOW (ref 13.0–17.7)
Immature Grans (Abs): 0.1 10*3/uL (ref 0.0–0.1)
Immature Granulocytes: 1 %
Lymphocytes Absolute: 1.9 10*3/uL (ref 0.7–3.1)
Lymphs: 18 %
MCH: 31.8 pg (ref 26.6–33.0)
MCHC: 35.3 g/dL (ref 31.5–35.7)
MCV: 90 fL (ref 79–97)
Monocytes Absolute: 1.2 10*3/uL — ABNORMAL HIGH (ref 0.1–0.9)
Monocytes: 12 %
Neutrophils Absolute: 6.7 10*3/uL (ref 1.4–7.0)
Neutrophils: 63 %
Platelets: 392 10*3/uL (ref 150–450)
RBC: 3.59 x10E6/uL — ABNORMAL LOW (ref 4.14–5.80)
RDW: 14.8 % (ref 11.6–15.4)
WBC: 10.6 10*3/uL (ref 3.4–10.8)

## 2019-11-26 LAB — CMP14+EGFR
ALT: 23 IU/L (ref 0–44)
AST: 28 IU/L (ref 0–40)
Albumin/Globulin Ratio: 1.5 (ref 1.2–2.2)
Albumin: 4 g/dL (ref 3.6–4.6)
Alkaline Phosphatase: 149 IU/L — ABNORMAL HIGH (ref 39–117)
BUN/Creatinine Ratio: 17 (ref 10–24)
BUN: 18 mg/dL (ref 8–27)
Bilirubin Total: 0.3 mg/dL (ref 0.0–1.2)
CO2: 23 mmol/L (ref 20–29)
Calcium: 9.4 mg/dL (ref 8.6–10.2)
Chloride: 104 mmol/L (ref 96–106)
Creatinine, Ser: 1.07 mg/dL (ref 0.76–1.27)
GFR calc Af Amer: 73 mL/min/{1.73_m2} (ref >59)
GFR calc non Af Amer: 63 mL/min/{1.73_m2} (ref >59)
Globulin, Total: 2.6 g/dL (ref 1.5–4.5)
Glucose: 84 mg/dL (ref 65–99)
Potassium: 4.5 mmol/L (ref 3.5–5.2)
Sodium: 140 mmol/L (ref 134–144)
Total Protein: 6.6 g/dL (ref 6.0–8.5)

## 2019-11-26 LAB — PHENYTOIN LEVEL, FREE AND TOTAL
Phenytoin, Free: 0.8 ug/mL — ABNORMAL LOW (ref 1.0–2.0)
Phenytoin, Serum: 8.4 ug/mL — ABNORMAL LOW (ref 10.0–20.0)

## 2019-11-26 LAB — PHENOBARBITAL LEVEL: Phenobarbital, Serum: 23 ug/mL (ref 15–40)

## 2019-11-26 LAB — LIPID PANEL
Chol/HDL Ratio: 2.6 ratio (ref 0.0–5.0)
Cholesterol, Total: 142 mg/dL (ref 100–199)
HDL: 54 mg/dL (ref >39)
LDL Chol Calc (NIH): 72 mg/dL (ref 0–99)
Triglycerides: 83 mg/dL (ref 0–149)
VLDL Cholesterol Cal: 16 mg/dL (ref 5–40)

## 2019-11-26 LAB — VITAMIN D 25 HYDROXY (VIT D DEFICIENCY, FRACTURES): Vit D, 25-Hydroxy: 38 ng/mL (ref 30.0–100.0)

## 2019-11-27 DIAGNOSIS — Z8546 Personal history of malignant neoplasm of prostate: Secondary | ICD-10-CM | POA: Diagnosis not present

## 2019-11-27 DIAGNOSIS — Z8551 Personal history of malignant neoplasm of bladder: Secondary | ICD-10-CM | POA: Diagnosis not present

## 2019-12-10 ENCOUNTER — Ambulatory Visit (INDEPENDENT_AMBULATORY_CARE_PROVIDER_SITE_OTHER): Payer: Medicare Other | Admitting: *Deleted

## 2019-12-10 DIAGNOSIS — I441 Atrioventricular block, second degree: Secondary | ICD-10-CM

## 2019-12-11 LAB — CUP PACEART REMOTE DEVICE CHECK
Battery Remaining Longevity: 94 mo
Battery Voltage: 3 V
Brady Statistic AP VP Percent: 53.74 %
Brady Statistic AP VS Percent: 0.08 %
Brady Statistic AS VP Percent: 44.15 %
Brady Statistic AS VS Percent: 2.03 %
Brady Statistic RA Percent Paced: 54.12 %
Brady Statistic RV Percent Paced: 97.89 %
Date Time Interrogation Session: 20210323153145
Implantable Lead Implant Date: 20190509
Implantable Lead Implant Date: 20190509
Implantable Lead Location: 753859
Implantable Lead Location: 753860
Implantable Lead Model: 3830
Implantable Lead Model: 5076
Implantable Pulse Generator Implant Date: 20190509
Lead Channel Impedance Value: 266 Ohm
Lead Channel Impedance Value: 361 Ohm
Lead Channel Impedance Value: 475 Ohm
Lead Channel Impedance Value: 551 Ohm
Lead Channel Pacing Threshold Amplitude: 1 V
Lead Channel Pacing Threshold Amplitude: 1.5 V
Lead Channel Pacing Threshold Pulse Width: 0.4 ms
Lead Channel Pacing Threshold Pulse Width: 0.4 ms
Lead Channel Sensing Intrinsic Amplitude: 1 mV
Lead Channel Sensing Intrinsic Amplitude: 1 mV
Lead Channel Sensing Intrinsic Amplitude: 2.375 mV
Lead Channel Sensing Intrinsic Amplitude: 2.375 mV
Lead Channel Setting Pacing Amplitude: 2.25 V
Lead Channel Setting Pacing Amplitude: 3.25 V
Lead Channel Setting Pacing Pulse Width: 0.4 ms
Lead Channel Setting Sensing Sensitivity: 0.9 mV

## 2019-12-19 DIAGNOSIS — Z961 Presence of intraocular lens: Secondary | ICD-10-CM | POA: Diagnosis not present

## 2019-12-19 DIAGNOSIS — H04123 Dry eye syndrome of bilateral lacrimal glands: Secondary | ICD-10-CM | POA: Diagnosis not present

## 2019-12-19 DIAGNOSIS — H353231 Exudative age-related macular degeneration, bilateral, with active choroidal neovascularization: Secondary | ICD-10-CM | POA: Diagnosis not present

## 2020-01-23 ENCOUNTER — Telehealth: Payer: Self-pay | Admitting: Family Medicine

## 2020-01-23 MED ORDER — PHENOBARBITAL 64.8 MG PO TABS: 64.8000 mg | ORAL_TABLET | Freq: Two times a day (BID) | ORAL | 1 refills | Status: DC

## 2020-01-23 NOTE — Telephone Encounter (Signed)
Patient needs refills.please advise.

## 2020-01-23 NOTE — Telephone Encounter (Signed)
  Prescription Request  01/23/2020  What is the name of the medication or equipment? PHENobarbital (LUMINAL) 64.8 MG tablet/ pt wants to speak with Roselyn Reef Bullins about rx. I told pt he needed appointment  Have you contacted your pharmacy to request a refill? (if applicable) no  Which pharmacy would you like this sent to? [facility]   Patient notified that their request is being sent to the clinical staff for review and that they should receive a response within 2 business days.

## 2020-01-27 ENCOUNTER — Other Ambulatory Visit: Payer: Self-pay | Admitting: *Deleted

## 2020-01-27 MED ORDER — PHENOBARBITAL 64.8 MG PO TABS: 64.8000 mg | ORAL_TABLET | Freq: Two times a day (BID) | ORAL | 1 refills | Status: DC

## 2020-01-27 MED ORDER — PHENYTOIN SODIUM EXTENDED 100 MG PO CAPS: ORAL_CAPSULE | ORAL | 1 refills | Status: DC

## 2020-03-10 ENCOUNTER — Ambulatory Visit (INDEPENDENT_AMBULATORY_CARE_PROVIDER_SITE_OTHER): Payer: Medicare Other | Admitting: *Deleted

## 2020-03-10 DIAGNOSIS — I441 Atrioventricular block, second degree: Secondary | ICD-10-CM

## 2020-03-10 LAB — CUP PACEART REMOTE DEVICE CHECK
Battery Remaining Longevity: 83 mo
Battery Voltage: 2.99 V
Brady Statistic AP VP Percent: 60.92 %
Brady Statistic AP VS Percent: 0.07 %
Brady Statistic AS VP Percent: 37.22 %
Brady Statistic AS VS Percent: 1.79 %
Brady Statistic RA Percent Paced: 61.31 %
Brady Statistic RV Percent Paced: 98.14 %
Date Time Interrogation Session: 20210622002650
Implantable Lead Implant Date: 20190509
Implantable Lead Implant Date: 20190509
Implantable Lead Location: 753859
Implantable Lead Location: 753860
Implantable Lead Model: 3830
Implantable Lead Model: 5076
Implantable Pulse Generator Implant Date: 20190509
Lead Channel Impedance Value: 266 Ohm
Lead Channel Impedance Value: 361 Ohm
Lead Channel Impedance Value: 456 Ohm
Lead Channel Impedance Value: 513 Ohm
Lead Channel Pacing Threshold Amplitude: 1.125 V
Lead Channel Pacing Threshold Amplitude: 1.75 V
Lead Channel Pacing Threshold Pulse Width: 0.4 ms
Lead Channel Pacing Threshold Pulse Width: 0.4 ms
Lead Channel Sensing Intrinsic Amplitude: 0.5 mV
Lead Channel Sensing Intrinsic Amplitude: 0.5 mV
Lead Channel Sensing Intrinsic Amplitude: 3.125 mV
Lead Channel Sensing Intrinsic Amplitude: 3.125 mV
Lead Channel Setting Pacing Amplitude: 2.25 V
Lead Channel Setting Pacing Amplitude: 3.5 V
Lead Channel Setting Pacing Pulse Width: 0.4 ms
Lead Channel Setting Sensing Sensitivity: 0.9 mV

## 2020-03-11 NOTE — Progress Notes (Signed)
Remote pacemaker transmission.   

## 2020-04-02 DIAGNOSIS — Z961 Presence of intraocular lens: Secondary | ICD-10-CM | POA: Diagnosis not present

## 2020-04-02 DIAGNOSIS — H04123 Dry eye syndrome of bilateral lacrimal glands: Secondary | ICD-10-CM | POA: Diagnosis not present

## 2020-04-02 DIAGNOSIS — H353231 Exudative age-related macular degeneration, bilateral, with active choroidal neovascularization: Secondary | ICD-10-CM | POA: Diagnosis not present

## 2020-04-14 NOTE — Progress Notes (Signed)
Cardiology Office Note:    Date:  04/15/2020   ID:  Joshua [name], DOB Jun 07, 1932, MRN [id number]  PCP:  Joshua Pretty, FNP  Cardiologist:  Joshua Grooms, MD   Referring MD: Joshua [name], *   Chief Complaint  Patient presents with   Coronary Artery Disease   Congestive Heart Failure    History of Present Illness:    Joshua [name] is a 86 y.o. 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 Prior acute diastolic heart failure resolved with stent.  No recent hospitalizations.. He drove himself to [place].  He was able to independently ambulate from the parking lot into the office.  When he takes his time he has no significant shortness of breath or chest pain.  He has had occasional lower extremity swelling with occasional blisters that developed.  These have not been a problem recently.  No transient neurological symptoms, angina, orthopnea, PND, or syncope.  Past Medical History:  Diagnosis Date   Arteriovenous malformation    caused intracerebrial bleed w seizure -- post craniotomy 1980   Arthritis    Bilateral lower extremity edema    Bladder tumor    CAD (coronary artery disease) cardiologist--  dr Joshua [name]   a. 04/27/2016 PCI with DES to RCA with 50% ostial to 60% segmental mid to distal left main, and 90% thrombus filled ostial to proximal circumflex, with distal right coronary filled by collaterals from left-to-right   COPD with emphysema (HCC)    Dyspnea on exertion    ED (erectile dysfunction)    First degree AV block    GERD (gastroesophageal reflux disease)    History of adenomatous polyp of colon    09-22-2008   History of bladder cancer    s/p  resection bladder tumor 04-19-2016  non-invasive low-grade urothelial carcinoma     History of prostate cancer urologist-  dr Joshua [name]-- last PSA 0.02 (summer 2017)   dx 02/ 2013-- Stage T2a, Gleason 7, PSA 1.58--  s/p  radioactive prostate seed implants 01-19-2012   History of ST elevation myocardial infarction (STEMI)    04-27-2016   Hyperlipidemia    Hypertension    Macular degeneration    Presence of permanent cardiac pacemaker    S/P drug eluting coronary stent placement    04-27-2016    Second degree Mobitz I AV block    occasional w/ first degree heart block  per cardiologist note by dr Joshua [name]   Seizures [facility]) per pt son -- no seizure's since 1980   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  arteriorvenous cerebral malformation s/p  craniotomy 1980   Vitamin D deficiency     Past Surgical History:  Procedure Laterality Date   APPENDECTOMY  2011   BLEPHAROPLASTY Bilateral revision 02-17-2016   CARDIAC CATHETERIZATION N/A 04/27/2016   Procedure: Left Heart Cath and Coronary Angiography;  Surgeon: Joshua Crome, MD;  Location: [place] [facility];  Service: Cardiovascular;  Laterality: N/A;  acute inferior wall STEMI ;  ostCx to midCx 90%,  ost1st Mrg to 1st Mrg 50%,  mid to distal RCA 100%,  pRCA 80%,  LVEF 45-50% w/ inferobasal akinesis   CARDIAC CATHETERIZATION N/A 04/27/2016   Procedure: Temporary Pacemaker;  Surgeon: Joshua Crome, MD;  Location: [place] [facility];  Service: Cardiovascular;  Laterality: N/A;  mobitz 2  second degree HB w/ heart rate 29bpm   CARDIAC CATHETERIZATION N/A 04/27/2016   Procedure: Coronary Stent Intervention;  Surgeon: Joshua Crome, MD;  Location: [place] [facility];  Service: Cardiovascular;  Laterality: N/A; DES x1 to  Proximal RCA;  DES x1 to Mid RCA   CATARACT EXTRACTION W/ INTRAOCULAR LENS  IMPLANT, BILATERAL  2016  approx.   CRANIOTOMY  1980   intracranial bleed from arteriorvenous malformation (left parietal)   CYSTOSCOPY  01/19/2012   Procedure: CYSTOSCOPY;  Surgeon: Joshua So, MD;  [facility];  Service: Urology;;  no seeds found in bladder   CYSTOSCOPY W/ RETROGRADES Bilateral 04/19/2016   Procedure: CYSTOSCOPY WITH BILATERAL RETROGRADE PYELOGRAM TRANSURETHRAL RESECTION OF BLADDER TUMOR ;  Surgeon: Joshua Seal, MD;  Location: [facility];  Service: Urology;  Laterality: Bilateral;   CYSTOSCOPY WITH BIOPSY N/A 11/10/2016   Procedure: CYSTOSCOPY WITH BIOPSY AND FULGURATION;  Surgeon: Joshua Seal, MD;  Location: [facility];  Service: Urology;  Laterality: N/A;   INSERT / REPLACE / REMOVE PACEMAKER  06/15/2017   LEAD EXTRACTION N/A 01/22/2018   Procedure: LEAD EXTRACTION;  Surgeon: Joshua Lance, MD;  Location: [place] [facility];  Service: Cardiovascular;  Laterality: N/A;   PACEMAKER IMPLANT N/A 06/15/2017   Procedure: Pacemaker Implant;  Surgeon: Joshua Grayer, MD;  Location: [place] [facility];  Service: Cardiovascular;  Laterality: N/A;   PACEMAKER IMPLANT N/A 01/25/2018   Procedure: PACEMAKER IMPLANT;  Surgeon: Joshua Lance, MD;  Location: [place] [facility];  Service: Cardiovascular;  Laterality: N/A;   PPM GENERATOR REMOVAL N/A 01/22/2018   Procedure: PPM GENERATOR REMOVAL;  Surgeon: Joshua Lance, MD;  Location: [place] [facility];  Service: Cardiovascular;  Laterality: N/A;   RADIOACTIVE SEED IMPLANT  01/19/2012   Procedure: RADIOACTIVE SEED IMPLANT;  Surgeon: Joshua So, MD;  Location: [facility];  Service: Urology;  Laterality: N/A;  70 seeds implanted   TRANSTHORACIC ECHOCARDIOGRAM  06/02/2016   ef 60-65%/  trivial MR/  mild TR    Current Medications: Current Meds  Medication Sig   atorvastatin (LIPITOR) 20 MG tablet Take 1 tablet (20 mg total) by mouth daily.   calcium carbonate (OSCAL) 1500 (600 Ca) MG TABS tablet Take 600 mg of elemental calcium by mouth daily with breakfast.   Cholecalciferol (VITAMIN D3) 1000 units CAPS Take 1 capsule by mouth daily.   citalopram (CELEXA) 20 MG tablet Take 1 tablet (20  mg total) by mouth daily.   clopidogrel (PLAVIX) 75 MG tablet Take 1 tablet (75 mg total) by mouth daily.   ferrous sulfate 325 (65 FE) MG tablet Take 1 tablet (325 mg total) by mouth daily with breakfast.   MAGNESIUM PO Take 1 tablet by mouth at bedtime.   Multiple Vitamins-Minerals (PRESERVISION AREDS 2) CAPS Take 1 capsule by mouth 2 (two) times daily.    OVER THE COUNTER MEDICATION Generic heartburn releif   PHENobarbital (LUMINAL) 64.8 MG tablet Take 1 tablet (64.8 mg total) by mouth 2 (two) times daily.   phenytoin (DILANTIN) 100 MG ER capsule TAKE (2) CAPSULES TWICE DAILY.   polyvinyl alcohol (LIQUIFILM TEARS) 1.4 % ophthalmic solution 1 drop as needed.   spironolactone (ALDACTONE) 25 MG tablet Take 1 tablet (25 mg total) by mouth daily.     Allergies:   Penicillins   Social History   Socioeconomic History   Marital status: Widowed  Spouse name: Not on file   Number of children: 2   Years of education: 1 year of college   Highest education level: Not on file  Occupational History   Occupation: Retired  Tobacco Use   Smoking status: Former Smoker    Packs/day: 2.00    Years: 15.00    Pack years: 30.00    Quit date: 01/15/1973    Years since quitting: 47.2   Smokeless tobacco: Never Used  Vaping Use   Vaping Use: Never used  Substance and Sexual Activity   Alcohol use: Yes    Comment: occasionally   Drug use: No   Sexual activity: Not Currently  Other Topics Concern   Not on file  Social History Narrative   Lives at home alone. Has contact with son daily and has dinner with him weekly. [place]    Social Determinants of Health   Financial Resource Strain:    Difficulty of Paying Living Expenses:   Food Insecurity:    Worried About Charity fundraiser in the Last Year:    Arboriculturist in the Last Year:   Transportation Needs:    Film/video editor (Medical):    Lack of Transportation (Non-Medical):    Physical Activity: Unknown   Days of Exercise per Week: Not on file   Minutes of Exercise per Session: 30 min  Stress:    Feeling of Stress :   Social Connections:    Frequency of Communication with Friends and Family:    Frequency of Social Gatherings with Friends and Family:    Attends Religious Services:    Active Member of Clubs or Organizations:    Attends Archivist Meetings:    Marital Status:      Family History: The patient's family history includes Emphysema in his father and mother; Stroke in his mother.  ROS:   Please see the history of present illness.    He saw a dermatologist after developing a blister on his lower extremity.  It "popped" and went away.  The dermatologist recommended compression stockings.  I mentioned that I hear crackles in his lungs and he states this is been chronic over the years.  All other systems reviewed and are negative.  EKGs/Labs/Other Studies Reviewed:    The following studies were reviewed today: No recent cardiac imaging  EKG:  EKG not performed today.  Recent Labs: 07/19/2019: TSH 2.800 08/21/2019: NT-Pro BNP 424 11/25/2019: ALT 23; BUN 18; Creatinine, Ser 1.07; Hemoglobin 11.4; Platelets 392; Potassium 4.5; Sodium 140  Recent Lipid Panel    Component Value Date/Time   CHOL 142 11/25/2019 1302   TRIG 83 11/25/2019 1302   TRIG 85 01/20/2016 0958   HDL 54 11/25/2019 1302   HDL 69 01/20/2016 0958   CHOLHDL 2.6 11/25/2019 1302   CHOLHDL 3.3 04/27/2016 1133   VLDL 16 04/27/2016 1133   LDLCALC 72 11/25/2019 1302   LDLCALC 99 05/21/2014 1101    Physical Exam:    VS:  BP (!) 162/68    Pulse 70    Ht 5\' 10"  (1.778 m)    Wt 181 lb (82.1 kg)    SpO2 93%    BMI 25.97 kg/m     Wt Readings from Last 3 Encounters:  04/15/20 181 lb (82.1 kg)  11/25/19 182 lb (82.6 kg)  08/21/19 189 lb 6.4 oz (85.9 kg)     GEN: Compatible with age.. No acute distress HEENT: Normal NECK: No  JVD. LYMPHATICS: No  lymphadenopathy CARDIAC:  RRR without murmur, gallop, or edema. VASCULAR:  Normal Pulses. No bruits. RESPIRATORY:  Clear to auscultation without rales, wheezing or rhonchi  ABDOMEN: Soft, non-tender, non-distended, No pulsatile mass, MUSCULOSKELETAL: No deformity  SKIN: Warm and dry NEUROLOGIC:  Alert and oriented x 3 PSYCHIATRIC:  Normal affect   ASSESSMENT:    1. Coronary artery disease of native artery of native heart with stable angina pectoris ([place])   2. AV block, Mobitz 2   3. Cardiac pacemaker in situ   4. Chronic diastolic heart failure ([place])   5. Essential hypertension   6. Hypercholesterolemia   7. Educated about COVID-19 virus infection    PLAN:    In order of problems listed above:  1. He is doing well and should continue statin therapy, for prevention.  Secondary prevention discussed.   2. Permanent pacemaker functioning normally when last evaluated. 3. Same as above. 4. No significant volume overload.  Continue Aldactone 25 mg/day. 5. Continue Aldactone 25 mg/day. 6. Continue Lipitor 20 mg/day.  Asked LDL was 60 in March 2021. 7. He has been vaccinated and is practicing social distancing.  One year f/u.   Medication Adjustments/Labs and Tests Ordered: Current medicines are reviewed at length with the patient today.  Concerns regarding medicines are outlined above.  No orders of the defined types were placed in this encounter.  No orders of the defined types were placed in this encounter.   There are no Patient Instructions on file for this visit.   Signed, Joshua Grooms, MD  04/15/2020 1:44 PM    [place]

## 2020-04-15 ENCOUNTER — Ambulatory Visit (INDEPENDENT_AMBULATORY_CARE_PROVIDER_SITE_OTHER): Payer: Medicare Other | Admitting: Interventional Cardiology

## 2020-04-15 ENCOUNTER — Other Ambulatory Visit: Payer: Self-pay

## 2020-04-15 ENCOUNTER — Encounter: Payer: Self-pay | Admitting: Interventional Cardiology

## 2020-04-15 VITALS — BP 162/68 | HR 70 | Ht 70.0 in | Wt 181.0 lb

## 2020-04-15 DIAGNOSIS — E7800 Pure hypercholesterolemia, unspecified: Secondary | ICD-10-CM

## 2020-04-15 DIAGNOSIS — Z950 Presence of cardiac pacemaker: Secondary | ICD-10-CM

## 2020-04-15 DIAGNOSIS — Z7189 Other specified counseling: Secondary | ICD-10-CM

## 2020-04-15 DIAGNOSIS — I25118 Atherosclerotic heart disease of native coronary artery with other forms of angina pectoris: Secondary | ICD-10-CM

## 2020-04-15 DIAGNOSIS — I10 Essential (primary) hypertension: Secondary | ICD-10-CM

## 2020-04-15 DIAGNOSIS — I5032 Chronic diastolic (congestive) heart failure: Secondary | ICD-10-CM | POA: Diagnosis not present

## 2020-04-15 DIAGNOSIS — I441 Atrioventricular block, second degree: Secondary | ICD-10-CM | POA: Diagnosis not present

## 2020-04-15 NOTE — Patient Instructions (Signed)
Medication Instructions:  Your physician recommends that you continue on your current medications as directed. Please refer to the Current Medication list given to you today.  *If you need a refill on your cardiac medications before your next appointment, please call your pharmacy*   Lab Work: None If you have labs (blood work) drawn today and your tests are completely normal, you will receive your results only by:  [place] (if you have MyChart) OR  A paper copy in the mail If you have any lab test that is abnormal or we need to change your treatment, we will call you to review the results.   Testing/Procedures: None   Follow-Up: At [facility], you and your health needs are our priority.  As part of our continuing mission to provide you with exceptional heart care, we have created designated Provider Care Teams.  These Care Teams include your primary Cardiologist (physician) and Advanced Practice Providers (APPs -  Physician Assistants and Nurse Practitioners) who all work together to provide you with the care you need, when you need it.   Your next appointment:   12 month(s)  The format for your next appointment:   In Person  Provider:   You may see Sinclair Grooms, MD or one of the following Advanced Practice Providers on your designated Care Team:    Truitt Merle, NP  Cecilie Kicks, NP  Kathyrn Drown, NP

## 2020-05-07 DIAGNOSIS — Z9841 Cataract extraction status, right eye: Secondary | ICD-10-CM | POA: Diagnosis not present

## 2020-05-07 DIAGNOSIS — H04123 Dry eye syndrome of bilateral lacrimal glands: Secondary | ICD-10-CM | POA: Diagnosis not present

## 2020-05-07 DIAGNOSIS — Z9842 Cataract extraction status, left eye: Secondary | ICD-10-CM | POA: Diagnosis not present

## 2020-05-07 DIAGNOSIS — Z961 Presence of intraocular lens: Secondary | ICD-10-CM | POA: Diagnosis not present

## 2020-05-07 DIAGNOSIS — H353231 Exudative age-related macular degeneration, bilateral, with active choroidal neovascularization: Secondary | ICD-10-CM | POA: Diagnosis not present

## 2020-05-26 DIAGNOSIS — L570 Actinic keratosis: Secondary | ICD-10-CM | POA: Diagnosis not present

## 2020-05-26 DIAGNOSIS — L280 Lichen simplex chronicus: Secondary | ICD-10-CM | POA: Diagnosis not present

## 2020-05-26 DIAGNOSIS — D229 Melanocytic nevi, unspecified: Secondary | ICD-10-CM | POA: Diagnosis not present

## 2020-05-26 DIAGNOSIS — L821 Other seborrheic keratosis: Secondary | ICD-10-CM | POA: Diagnosis not present

## 2020-05-26 DIAGNOSIS — D485 Neoplasm of uncertain behavior of skin: Secondary | ICD-10-CM | POA: Diagnosis not present

## 2020-06-05 ENCOUNTER — Other Ambulatory Visit: Payer: Self-pay | Admitting: *Deleted

## 2020-06-05 MED ORDER — SPIRONOLACTONE 25 MG PO TABS: 25.0000 mg | ORAL_TABLET | Freq: Every day | ORAL | 0 refills | Status: DC

## 2020-06-05 MED ORDER — CITALOPRAM HYDROBROMIDE 20 MG PO TABS: 20.0000 mg | ORAL_TABLET | Freq: Every day | ORAL | 0 refills | Status: DC

## 2020-06-09 ENCOUNTER — Ambulatory Visit (INDEPENDENT_AMBULATORY_CARE_PROVIDER_SITE_OTHER): Payer: Medicare Other | Admitting: *Deleted

## 2020-06-09 DIAGNOSIS — I441 Atrioventricular block, second degree: Secondary | ICD-10-CM | POA: Diagnosis not present

## 2020-06-09 LAB — CUP PACEART REMOTE DEVICE CHECK
Battery Remaining Longevity: 72 mo
Battery Voltage: 2.99 V
Brady Statistic AP VP Percent: 51.03 %
Brady Statistic AP VS Percent: 0.07 %
Brady Statistic AS VP Percent: 46.91 %
Brady Statistic AS VS Percent: 1.99 %
Brady Statistic RA Percent Paced: 51.43 %
Brady Statistic RV Percent Paced: 97.94 %
Date Time Interrogation Session: 20210921013509
Implantable Lead Implant Date: 20190509
Implantable Lead Implant Date: 20190509
Implantable Lead Location: 753859
Implantable Lead Location: 753860
Implantable Lead Model: 3830
Implantable Lead Model: 5076
Implantable Pulse Generator Implant Date: 20190509
Lead Channel Impedance Value: 247 Ohm
Lead Channel Impedance Value: 342 Ohm
Lead Channel Impedance Value: 437 Ohm
Lead Channel Impedance Value: 475 Ohm
Lead Channel Pacing Threshold Amplitude: 1.125 V
Lead Channel Pacing Threshold Amplitude: 1.875 V
Lead Channel Pacing Threshold Pulse Width: 0.4 ms
Lead Channel Pacing Threshold Pulse Width: 0.4 ms
Lead Channel Sensing Intrinsic Amplitude: 0.5 mV
Lead Channel Sensing Intrinsic Amplitude: 0.5 mV
Lead Channel Sensing Intrinsic Amplitude: 3.125 mV
Lead Channel Sensing Intrinsic Amplitude: 3.125 mV
Lead Channel Setting Pacing Amplitude: 2.25 V
Lead Channel Setting Pacing Amplitude: 3.75 V
Lead Channel Setting Pacing Pulse Width: 0.4 ms
Lead Channel Setting Sensing Sensitivity: 0.9 mV

## 2020-06-10 NOTE — Progress Notes (Signed)
Remote pacemaker transmission.   

## 2020-06-22 ENCOUNTER — Ambulatory Visit (INDEPENDENT_AMBULATORY_CARE_PROVIDER_SITE_OTHER): Payer: Medicare Other | Admitting: Nurse Practitioner

## 2020-06-22 ENCOUNTER — Encounter: Payer: Self-pay | Admitting: Nurse Practitioner

## 2020-06-22 ENCOUNTER — Emergency Department (HOSPITAL_COMMUNITY): Payer: Medicare Other

## 2020-06-22 ENCOUNTER — Telehealth: Payer: Self-pay | Admitting: Interventional Cardiology

## 2020-06-22 ENCOUNTER — Ambulatory Visit: Payer: Medicare Other | Admitting: Nurse Practitioner

## 2020-06-22 ENCOUNTER — Other Ambulatory Visit: Payer: Self-pay

## 2020-06-22 ENCOUNTER — Inpatient Hospital Stay (HOSPITAL_COMMUNITY)
Admission: EM | Admit: 2020-06-22 | Discharge: 2020-06-26 | DRG: 193 | Disposition: A | Discharge status: Home Health | Payer: Medicare Other | Attending: Internal Medicine | Admitting: Internal Medicine

## 2020-06-22 ENCOUNTER — Encounter (HOSPITAL_COMMUNITY): Payer: Self-pay

## 2020-06-22 VITALS — BP 158/78 | HR 76 | Ht 73.0 in | Wt 184.0 lb

## 2020-06-22 DIAGNOSIS — K219 Gastro-esophageal reflux disease without esophagitis: Secondary | ICD-10-CM | POA: Diagnosis present

## 2020-06-22 DIAGNOSIS — E559 Vitamin D deficiency, unspecified: Secondary | ICD-10-CM | POA: Diagnosis present

## 2020-06-22 DIAGNOSIS — Z825 Family history of asthma and other chronic lower respiratory diseases: Secondary | ICD-10-CM

## 2020-06-22 DIAGNOSIS — J811 Chronic pulmonary edema: Secondary | ICD-10-CM | POA: Diagnosis not present

## 2020-06-22 DIAGNOSIS — Z955 Presence of coronary angioplasty implant and graft: Secondary | ICD-10-CM | POA: Diagnosis not present

## 2020-06-22 DIAGNOSIS — Z87891 Personal history of nicotine dependence: Secondary | ICD-10-CM

## 2020-06-22 DIAGNOSIS — Z8551 Personal history of malignant neoplasm of bladder: Secondary | ICD-10-CM | POA: Diagnosis not present

## 2020-06-22 DIAGNOSIS — J479 Bronchiectasis, uncomplicated: Secondary | ICD-10-CM | POA: Diagnosis not present

## 2020-06-22 DIAGNOSIS — I5033 Acute on chronic diastolic (congestive) heart failure: Secondary | ICD-10-CM | POA: Diagnosis present

## 2020-06-22 DIAGNOSIS — M1990 Unspecified osteoarthritis, unspecified site: Secondary | ICD-10-CM | POA: Diagnosis present

## 2020-06-22 DIAGNOSIS — I2510 Atherosclerotic heart disease of native coronary artery without angina pectoris: Secondary | ICD-10-CM | POA: Diagnosis present

## 2020-06-22 DIAGNOSIS — I5032 Chronic diastolic (congestive) heart failure: Secondary | ICD-10-CM | POA: Diagnosis not present

## 2020-06-22 DIAGNOSIS — J432 Centrilobular emphysema: Secondary | ICD-10-CM | POA: Diagnosis present

## 2020-06-22 DIAGNOSIS — R0902 Hypoxemia: Secondary | ICD-10-CM | POA: Diagnosis not present

## 2020-06-22 DIAGNOSIS — E876 Hypokalemia: Secondary | ICD-10-CM | POA: Diagnosis present

## 2020-06-22 DIAGNOSIS — I10 Essential (primary) hypertension: Secondary | ICD-10-CM

## 2020-06-22 DIAGNOSIS — Z950 Presence of cardiac pacemaker: Secondary | ICD-10-CM

## 2020-06-22 DIAGNOSIS — E7800 Pure hypercholesterolemia, unspecified: Secondary | ICD-10-CM | POA: Diagnosis present

## 2020-06-22 DIAGNOSIS — I440 Atrioventricular block, first degree: Secondary | ICD-10-CM | POA: Diagnosis present

## 2020-06-22 DIAGNOSIS — Z79899 Other long term (current) drug therapy: Secondary | ICD-10-CM

## 2020-06-22 DIAGNOSIS — I25118 Atherosclerotic heart disease of native coronary artery with other forms of angina pectoris: Secondary | ICD-10-CM | POA: Diagnosis not present

## 2020-06-22 DIAGNOSIS — I214 Non-ST elevation (NSTEMI) myocardial infarction: Secondary | ICD-10-CM | POA: Diagnosis not present

## 2020-06-22 DIAGNOSIS — R0600 Dyspnea, unspecified: Secondary | ICD-10-CM | POA: Diagnosis not present

## 2020-06-22 DIAGNOSIS — R0602 Shortness of breath: Secondary | ICD-10-CM

## 2020-06-22 DIAGNOSIS — R7989 Other specified abnormal findings of blood chemistry: Secondary | ICD-10-CM

## 2020-06-22 DIAGNOSIS — I248 Other forms of acute ischemic heart disease: Secondary | ICD-10-CM | POA: Diagnosis present

## 2020-06-22 DIAGNOSIS — J9601 Acute respiratory failure with hypoxia: Secondary | ICD-10-CM | POA: Diagnosis not present

## 2020-06-22 DIAGNOSIS — Z20822 Contact with and (suspected) exposure to covid-19: Secondary | ICD-10-CM | POA: Diagnosis present

## 2020-06-22 DIAGNOSIS — D509 Iron deficiency anemia, unspecified: Secondary | ICD-10-CM | POA: Diagnosis present

## 2020-06-22 DIAGNOSIS — G40909 Epilepsy, unspecified, not intractable, without status epilepticus: Secondary | ICD-10-CM | POA: Diagnosis present

## 2020-06-22 DIAGNOSIS — I517 Cardiomegaly: Secondary | ICD-10-CM | POA: Diagnosis not present

## 2020-06-22 DIAGNOSIS — J8410 Pulmonary fibrosis, unspecified: Secondary | ICD-10-CM | POA: Diagnosis not present

## 2020-06-22 DIAGNOSIS — I700 Atherosclerosis of aorta: Secondary | ICD-10-CM | POA: Diagnosis not present

## 2020-06-22 DIAGNOSIS — E785 Hyperlipidemia, unspecified: Secondary | ICD-10-CM | POA: Diagnosis present

## 2020-06-22 DIAGNOSIS — I5031 Acute diastolic (congestive) heart failure: Secondary | ICD-10-CM | POA: Diagnosis not present

## 2020-06-22 DIAGNOSIS — J189 Pneumonia, unspecified organism: Principal | ICD-10-CM | POA: Diagnosis present

## 2020-06-22 DIAGNOSIS — I495 Sick sinus syndrome: Secondary | ICD-10-CM | POA: Diagnosis present

## 2020-06-22 DIAGNOSIS — Z7902 Long term (current) use of antithrombotics/antiplatelets: Secondary | ICD-10-CM | POA: Diagnosis not present

## 2020-06-22 DIAGNOSIS — Z8546 Personal history of malignant neoplasm of prostate: Secondary | ICD-10-CM

## 2020-06-22 DIAGNOSIS — I509 Heart failure, unspecified: Secondary | ICD-10-CM

## 2020-06-22 DIAGNOSIS — R778 Other specified abnormalities of plasma proteins: Secondary | ICD-10-CM | POA: Diagnosis not present

## 2020-06-22 DIAGNOSIS — I110 Hypertensive heart disease with heart failure: Secondary | ICD-10-CM | POA: Diagnosis present

## 2020-06-22 DIAGNOSIS — I252 Old myocardial infarction: Secondary | ICD-10-CM

## 2020-06-22 DIAGNOSIS — J849 Interstitial pulmonary disease, unspecified: Secondary | ICD-10-CM | POA: Diagnosis not present

## 2020-06-22 DIAGNOSIS — Z880 Allergy status to penicillin: Secondary | ICD-10-CM

## 2020-06-22 LAB — COMPREHENSIVE METABOLIC PANEL
ALT: 37 U/L (ref 0–44)
AST: 41 U/L (ref 15–41)
Albumin: 3.4 g/dL — ABNORMAL LOW (ref 3.5–5.0)
Alkaline Phosphatase: 136 U/L — ABNORMAL HIGH (ref 38–126)
Anion gap: 10 (ref 5–15)
BUN: 21 mg/dL (ref 8–23)
CO2: 23 mmol/L (ref 22–32)
Calcium: 8.9 mg/dL (ref 8.9–10.3)
Chloride: 103 mmol/L (ref 98–111)
Creatinine, Ser: 0.91 mg/dL (ref 0.61–1.24)
GFR calc Af Amer: >60 mL/min (ref >60)
GFR calc non Af Amer: >60 mL/min (ref >60)
Glucose, Bld: 101 mg/dL — ABNORMAL HIGH (ref 70–99)
Potassium: 4.3 mmol/L (ref 3.5–5.1)
Sodium: 136 mmol/L (ref 135–145)
Total Bilirubin: 0.8 mg/dL (ref 0.3–1.2)
Total Protein: 6 g/dL — ABNORMAL LOW (ref 6.5–8.1)

## 2020-06-22 LAB — RESPIRATORY PANEL BY RT PCR (FLU A&B, COVID)
Influenza A by PCR: NEGATIVE
Influenza B by PCR: NEGATIVE
SARS Coronavirus 2 by RT PCR: NEGATIVE

## 2020-06-22 LAB — CBC WITH DIFFERENTIAL/PLATELET
Abs Immature Granulocytes: 0.06 10*3/uL (ref 0.00–0.07)
Basophils Absolute: 0.1 10*3/uL (ref 0.0–0.1)
Basophils Relative: 1 %
Eosinophils Absolute: 0.1 10*3/uL (ref 0.0–0.5)
Eosinophils Relative: 1 %
HCT: 29.7 % — ABNORMAL LOW (ref 39.0–52.0)
Hemoglobin: 9.6 g/dL — ABNORMAL LOW (ref 13.0–17.0)
Immature Granulocytes: 1 %
Lymphocytes Relative: 15 %
Lymphs Abs: 1.3 10*3/uL (ref 0.7–4.0)
MCH: 30.6 pg (ref 26.0–34.0)
MCHC: 32.3 g/dL (ref 30.0–36.0)
MCV: 94.6 fL (ref 80.0–100.0)
Monocytes Absolute: 0.9 10*3/uL (ref 0.1–1.0)
Monocytes Relative: 11 %
Neutro Abs: 6 10*3/uL (ref 1.7–7.7)
Neutrophils Relative %: 71 %
Platelets: 296 10*3/uL (ref 150–400)
RBC: 3.14 MIL/uL — ABNORMAL LOW (ref 4.22–5.81)
RDW: 17.2 % — ABNORMAL HIGH (ref 11.5–15.5)
WBC: 8.4 10*3/uL (ref 4.0–10.5)
nRBC: 0.8 % — ABNORMAL HIGH (ref 0.0–0.2)

## 2020-06-22 LAB — TROPONIN I (HIGH SENSITIVITY)
Troponin I (High Sensitivity): 197 ng/L (ref <18)
Troponin I (High Sensitivity): 266 ng/L (ref <18)
Troponin I (High Sensitivity): 294 ng/L (ref <18)

## 2020-06-22 LAB — BRAIN NATRIURETIC PEPTIDE: B Natriuretic Peptide: 700.2 pg/mL — ABNORMAL HIGH (ref 0.0–100.0)

## 2020-06-22 MED ORDER — IPRATROPIUM BROMIDE HFA 17 MCG/ACT IN AERS
4.0000 | INHALATION_SPRAY | Freq: Once | RESPIRATORY_TRACT | Status: AC
  Administered 2020-06-22: 4 via RESPIRATORY_TRACT
  Filled 2020-06-22: qty 12.9

## 2020-06-22 MED ORDER — ACETAMINOPHEN 325 MG PO TABS
650.0000 mg | ORAL_TABLET | Freq: Four times a day (QID) | ORAL | Status: DC | PRN
  Administered 2020-06-26: 650 mg via ORAL
  Filled 2020-06-22: qty 2

## 2020-06-22 MED ORDER — ENOXAPARIN SODIUM 40 MG/0.4ML ~~LOC~~ SOLN
40.0000 mg | SUBCUTANEOUS | Status: DC
  Administered 2020-06-22 – 2020-06-25 (×4): 40 mg via SUBCUTANEOUS
  Filled 2020-06-22 (×4): qty 0.4

## 2020-06-22 MED ORDER — ASPIRIN 81 MG PO CHEW
324.0000 mg | CHEWABLE_TABLET | Freq: Once | ORAL | Status: AC
  Administered 2020-06-22: 324 mg via ORAL
  Filled 2020-06-22: qty 4

## 2020-06-22 MED ORDER — PANTOPRAZOLE SODIUM 40 MG PO TBEC
40.0000 mg | DELAYED_RELEASE_TABLET | Freq: Every day | ORAL | Status: DC
  Administered 2020-06-23 – 2020-06-26 (×4): 40 mg via ORAL
  Filled 2020-06-22 (×5): qty 1

## 2020-06-22 MED ORDER — ALBUTEROL SULFATE HFA 108 (90 BASE) MCG/ACT IN AERS
8.0000 | INHALATION_SPRAY | Freq: Once | RESPIRATORY_TRACT | Status: AC
  Administered 2020-06-22: 8 via RESPIRATORY_TRACT
  Filled 2020-06-22: qty 6.7

## 2020-06-22 MED ORDER — ACETAMINOPHEN 650 MG RE SUPP: 650.0000 mg | Freq: Four times a day (QID) | RECTAL | Status: DC | PRN

## 2020-06-22 MED ORDER — FUROSEMIDE 10 MG/ML IJ SOLN
40.0000 mg | Freq: Once | INTRAMUSCULAR | Status: AC
  Administered 2020-06-22: 40 mg via INTRAVENOUS
  Filled 2020-06-22: qty 4

## 2020-06-22 MED ORDER — LEVOFLOXACIN IN D5W 750 MG/150ML IV SOLN
750.0000 mg | INTRAVENOUS | Status: DC
  Administered 2020-06-23: 750 mg via INTRAVENOUS
  Filled 2020-06-22: qty 150

## 2020-06-22 MED ORDER — LEVOFLOXACIN IN D5W 750 MG/150ML IV SOLN
750.0000 mg | Freq: Once | INTRAVENOUS | Status: AC
  Administered 2020-06-22: 750 mg via INTRAVENOUS
  Filled 2020-06-22: qty 150

## 2020-06-22 NOTE — ED Notes (Signed)
Date and time results received: 06/22/20 1456 (use smartphrase ".now" to insert current time)  Test: troponin  Critical Value: 197  Name of Provider Notified: Sabra Heck   Orders Received? Or Actions Taken?: Orders Received - See Orders for details

## 2020-06-22 NOTE — Progress Notes (Signed)
CARDIOLOGY OFFICE NOTE  Date:  06/22/2020    Joshua [name] Date of Birth: July 14, 1932 Medical Record #[id number]  PCP:  Chevis Pretty, FNP  Cardiologist:  Tamala Julian   Chief Complaint  Patient presents with  . Shortness of Breath    Work in visit - seen for Dr. Tamala Julian    History of Present Illness: Joshua [name] is a 86 y.o. male who presents today for a work in visit. Seen for Dr. Tamala Julian.   He has a history of a seizure disorder (intracerebral bleed from AV malformation), prostate CA treated with seed implant, bladder CA, HTN, HLD, GERD, diastolic HF, known CAD with prior STEMI 05/10/2016 treated with DES toRCA, AV block requiring PPM in 2018 and replacement in 2019 due to infection.   He was last seen in July - pretty independent - still driving. Noted he has to take his time to avoid significant DOE/chest pain.   Phone call today concerning worsening SOB/DOE - did not wish to go to ER - thus added to my schedule for today.   Comes in today. Here with his son. The medicines are unknown to both of them. Apparently went to PCP this morning - they would not let him in due to symptoms of shortness of breath and concern for COVID 19. Hypoxic here today. He is not able to complete a sentence. Short of breath since Thursday - getting worse. Little cough. Unknown fever. Very exhausted with any activity and breaking out in sweats. He has been vaccinated earlier this year. No real gatherings that he has attended. He is on chronic Plavix and also on iron. No recent labs that I see.   Past Medical History:  Diagnosis Date  . 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 Arthritis   . Bilateral lower extremity edema   . Bladder tumor   . CAD (coronary artery disease) cardiologist--  dr Daneen Schick   a. 04/27/2016 PCI with DES to RCA with 50% ostial to 60% segmental mid to distal left main, and 90% thrombus filled ostial to proximal  circumflex, with distal right coronary filled by collaterals from left-to-right  . COPD with emphysema ([place])   . Dyspnea on exertion   . ED (erectile dysfunction)   . First degree AV block   . GERD (gastroesophageal reflux disease)   . History of adenomatous polyp of colon    09-22-2008  . History of bladder cancer    s/p  resection bladder tumor 04-19-2016  non-invasive low-grade urothelial carcinoma  . History of prostate cancer urologist-  dr Jeffie Pollock-- last PSA 0.02 (summer 2017)   dx 02/ 2013-- Stage T2a, Gleason 7, PSA 1.58--  s/p  radioactive prostate seed implants 01-19-2012  . History of ST elevation myocardial infarction (STEMI)    04-27-2016  . Hyperlipidemia   . Hypertension   . Macular degeneration   . Presence of permanent cardiac pacemaker   . S/P drug eluting coronary stent placement    04-27-2016   . Second degree Mobitz I AV block    occasional w/ first degree heart block  per cardiologist note by dr Daneen Schick  . Seizures ([place]) per pt son -- no seizure's since 1980   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  arteriorvenous cerebral malformation s/p  craniotomy 1980  . Vitamin D deficiency     Past Surgical History:  Procedure Laterality Date  . APPENDECTOMY  2011  .  BLEPHAROPLASTY Bilateral revision 02-17-2016  . CARDIAC CATHETERIZATION N/A 04/27/2016   Procedure: Left Heart Cath and Coronary Angiography;  Surgeon: Belva Crome, MD;  Location: [facility];  Service: Cardiovascular;  Laterality: N/A;  acute inferior wall STEMI ;  ostCx to midCx 90%,  ost1st Mrg to 1st Mrg 50%,  mid to distal RCA 100%,  pRCA 80%,  LVEF 45-50% w/ inferobasal akinesis  . CARDIAC CATHETERIZATION N/A 04/27/2016   Procedure: Temporary Pacemaker;  Surgeon: Belva Crome, MD;  Location: [place] [facility];  Service: Cardiovascular;  Laterality: N/A;  mobitz 2  second degree HB w/ heart rate 29bpm  . CARDIAC CATHETERIZATION N/A 04/27/2016   Procedure: Coronary Stent Intervention;   Surgeon: Belva Crome, MD;  Location: [place] [facility];  Service: Cardiovascular;  Laterality: N/A; DES x1 to  Proximal RCA;  DES x1 to Mid RCA  . CATARACT EXTRACTION W/ INTRAOCULAR LENS  IMPLANT, BILATERAL  2016  approx.  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 CYSTOSCOPY  01/19/2012   Procedure: CYSTOSCOPY;  Surgeon: Malka So, MD;  Location: [facility];  Service: Urology;;  no seeds found in bladder  . CYSTOSCOPY W/ RETROGRADES Bilateral 04/19/2016   Procedure: CYSTOSCOPY WITH BILATERAL RETROGRADE PYELOGRAM TRANSURETHRAL RESECTION OF BLADDER TUMOR ;  Surgeon: [name], MD;  Location: [facility];  Service: Urology;  Laterality: Bilateral;  . CYSTOSCOPY WITH BIOPSY N/A 11/10/2016   Procedure: CYSTOSCOPY WITH BIOPSY AND FULGURATION;  Surgeon: [name], MD;  Location[facility];  Service: Urology;  Laterality: N/A;  . INSERT / REPLACE / REMOVE PACEMAKER  06/15/2017  . LEAD EXTRACTION N/A 01/22/2018   Procedure: LEAD EXTRACTION;  Surgeon: Evans Lance, MD;  Location: [place] [facility];  Service: Cardiovascular;  Laterality: N/A;  . PACEMAKER IMPLANT N/A 06/15/2017   Procedure: Pacemaker Implant;  Surgeon: [name], MD;  Location: [place] [facility];  Service: Cardiovascular;  Laterality: N/A;  . PACEMAKER IMPLANT N/A 01/25/2018   Procedure: PACEMAKER IMPLANT;  Surgeon: Evans Lance, MD;  Location: [place] [facility];  Service: Cardiovascular;  Laterality: N/A;  . PPM GENERATOR REMOVAL N/A 01/22/2018   Procedure: PPM GENERATOR REMOVAL;  Surgeon: Evans Lance, MD;  Location: [place] [facility];  Service: Cardiovascular;  Laterality: N/A;  . RADIOACTIVE SEED IMPLANT  01/19/2012   Procedure: RADIOACTIVE SEED IMPLANT;  Surgeon: Malka So, MD;  Location: [facility];  Service: Urology;  Laterality: N/A;  68 seeds implanted  . TRANSTHORACIC ECHOCARDIOGRAM  06/02/2016   ef 60-65%/  trivial MR/   mild TR     Medications: Current Meds  Medication Sig  . atorvastatin (LIPITOR) 20 MG tablet Take 1 tablet (20 mg total) by mouth daily.  . calcium carbonate (OSCAL) 1500 (600 Ca) MG TABS tablet Take 600 mg of elemental calcium by mouth daily with breakfast.  . Cholecalciferol (VITAMIN D3) 1000 units CAPS Take 1 capsule by mouth daily.  . citalopram (CELEXA) 20 MG tablet Take 1 tablet (20 mg total) by mouth daily.  . clopidogrel (PLAVIX) 75 MG tablet Take 1 tablet (75 mg total) by mouth daily.  . ferrous sulfate 325 (65 FE) MG tablet Take 1 tablet (325 mg total) by mouth daily with breakfast.  . MAGNESIUM PO Take 1 tablet by mouth at bedtime.  . Multiple Vitamins-Minerals (PRESERVISION AREDS 2) CAPS Take 1 capsule by mouth 2 (two) times daily.   Marland Kitchen  OVER THE COUNTER MEDICATION Generic heartburn releif  . PHENobarbital (LUMINAL) 64.8 MG tablet Take 1 tablet (64.8 mg total) by mouth 2 (two) times daily.  . phenytoin (DILANTIN) 100 MG ER capsule TAKE (2) CAPSULES TWICE DAILY.  Marland Kitchen polyvinyl alcohol (LIQUIFILM TEARS) 1.4 % ophthalmic solution 1 drop as needed.  Marland Kitchen spironolactone (ALDACTONE) 25 MG tablet Take 1 tablet (25 mg total) by mouth daily.     Allergies: Allergies  Allergen Reactions  . Penicillins Rash    Has patient had a PCN reaction causing immediate rash, facial/tongue/throat swelling, SOB or lightheadedness with hypotension: ##Yes## Has patient had a PCN reaction causing severe rash involving mucus membranes or skin necrosis: No Has patient had a PCN reaction that required hospitalization No Has patient had a PCN reaction occurring within the last 10 years: No If all of the above answers are "NO", then may proceed with Cephalosporin use.     Social History: The patient  reports that he quit smoking about 47 years ago. He has a 30.00 pack-year smoking history. He has never used smokeless tobacco. He reports current alcohol use. He reports that he does not use drugs.   Family  History: The patient's family history includes Emphysema in his father and mother; Stroke in his mother.   Review of Systems: Please see the history of present illness.   All other systems are reviewed and negative.   Physical Exam: VS:  BP (!) 158/78   Pulse 76   Ht 6\' 1"  (1.854 m)   Wt 184 lb (83.5 kg)   SpO2 (!) 83%   BMI 24.28 kg/m  .  BMI Body mass index is 24.28 kg/m.  Wt Readings from Last 3 Encounters:  06/22/20 184 lb (83.5 kg)  04/15/20 181 lb (82.1 kg)  11/25/19 182 lb (82.6 kg)    General: Alert. He looks quite pale. He is short of breath with talking.    HEENT: Normal but he is pale.  Neck: No real JVD noted.  Cardiac: Regular rate and rhythm. No murmurs, rubs, or gallops. No edema.  Respiratory:  Lungs with rales - right > left.  GI: Soft and nontender.  MS: No deformity or atrophy. Gait and [name] intact.  Skin: Warm and dry. Color is normal.  Neuro:  Strength and sensation are intact and no gross focal deficits noted.  Psych: Alert, appropriate and with normal affect.   LABORATORY DATA:  EKG:  EKG is ordered today.  Personally reviewed by me. This demonstrates A sense and V paced - HR is 76.  Lab Results  Component Value Date   WBC 10.6 11/25/2019   HGB 11.4 (L) 11/25/2019   HCT 32.3 (L) 11/25/2019   PLT 392 11/25/2019   GLUCOSE 84 11/25/2019   CHOL 142 11/25/2019   TRIG 83 11/25/2019   HDL 54 11/25/2019   LDLCALC 72 11/25/2019   ALT 23 11/25/2019   AST 28 11/25/2019   NA 140 11/25/2019   K 4.5 11/25/2019   CL 104 11/25/2019   CREATININE 1.07 11/25/2019   BUN 18 11/25/2019   CO2 23 11/25/2019   TSH 2.800 07/19/2019   PSA <0.1 10/21/2014   INR 1.15 04/27/2016   HGBA1C 5.0 06/02/2016     BNP (last 3 results) No results for input(s): BNP in the last 8760 hours.  ProBNP (last 3 results) Recent Labs    08/21/19 1638  PROBNP 424     Other Studies Reviewed Today:  Echo Study Conclusions 05/2016  -  Left ventricle: The cavity size was  normal. Wall thickness was  normal. Systolic function was normal. The estimated ejection  fraction was in the range of 60% to 65%. Although no diagnostic  regional wall motion abnormality was identified, this possibility  cannot be completely excluded on the basis of this study. There  was an increased relative contribution of atrial contraction to  ventricular filling, which may be due to hypovolemia. Left  ventricular diastolic function parameters were normal. Doppler  parameters are consistent with low ventricular filling pressure.  - Mitral valve: Calcified annulus.  - Pulmonary arteries: PA peak pressure: 34 mm Hg (S).   Assessment/Plan:  1. Marked hypoxia - oxygen sat of 83% upon his arrival and he is short of breath with just speaking and even at rest - he looks anemic to me - starting oxygen here in the office. Needs CXR, labs, COVID testing and possible CT scan. Concern he may have COVID/pneumonia/marked anemia or heart failure. I have called the ER and alerted them to him coming by EMS. Oxygen started at 2 liters and sat is up to 93%.   2. Known CAD - remote PCI - no active chest pain.   3. Underlying PPM - has had recent check from last month noted with normal function.   4. Probable iron deficiency anemia - he is not sure - no recent labs.   5. HLD - not discussed  6. Seizure disorder - not discussed.    Current medicines are reviewed with the patient today.  The patient does not have concerns regarding medicines other than what has been noted above.  The following changes have been made:  See above.  Labs/ tests ordered today include:    Orders Placed This Encounter  Procedures  . EKG 12-Lead     Disposition:   Transferred to [facility] ER by EMS. Triage at [facility] alerted.    Patient is agreeable to this plan and will call if any problems develop in the interim.   SignedTruitt Merle, NP  06/22/2020 1:57 PM  [facility] [address] [place], [place]  [address] Phone: [phone] Fax: [phone]

## 2020-06-22 NOTE — Telephone Encounter (Signed)
Spoke with son and he states pt has been SOB with exertion for the last 3-4 days.  Is fine with rest.  No vitals available.  Pt always has some swelling in feet and ankles but this is unchanged.  Thursday night pt went to climb the stairs (only 8-9 steps) and had to rest and broke out in a sweat.  Denies CP.  Pt was suppose to see PCP today but due to SOB was told to go to ER instead.  Scheduled pt to be seen in the office today with Truitt Merle, NP.

## 2020-06-22 NOTE — Patient Instructions (Addendum)
After Visit Summary:  We will be checking the following labs today - NONE   Medication Instructions:    Continue with your current medicines.    If you need a refill on your cardiac medications before your next appointment, please call your pharmacy.     Testing/Procedures To Be Arranged:  N/A  Follow-Up:   See Dr. Tamala Julian as planned.     At [facility], you and your health needs are our priority.  As part of our continuing mission to provide you with exceptional heart care, we have created designated Provider Care Teams.  These Care Teams include your primary Cardiologist (physician) and Advanced Practice Providers (APPs -  Physician Assistants and Nurse Practitioners) who all work together to provide you with the care you need, when you need it.  Special Instructions:  . Stay safe, wash your hands for at least 20 seconds and wear a mask when needed.  . It was good to talk with you both today.    Call the [place] office at [phone] if you have any questions, problems or concerns.

## 2020-06-22 NOTE — Telephone Encounter (Signed)
Pt c/o Shortness Of Breath: STAT if SOB developed within the last 24 hours or pt is noticeably SOB on the phone  1. Are you currently SOB (can you hear that pt is SOB on the phone)? Yes currently SOB per [facility]  2. How long have you been experiencing SOB? 3-4 days  3. Are you SOB when sitting or when up moving around? Worse when he is up moving around but does have it when he is not doing anything.   4. Are you currently experiencing any other symptoms? None mentioned   Please contact son, [name] at [phone] and advise on what he should do. Chelsea states that they told him to go to the ED but he wants to hear from our office first.

## 2020-06-22 NOTE — ED Provider Notes (Signed)
[place] EMERGENCY DEPARTMENT Provider Note   CSN: [id number] Arrival date & time: 06/22/20  1505     History Chief Complaint  Patient presents with   Shortness of Breath    Joshua [name] is a 86 y.o. male with past medical history of CAD status post PCI in 2017, COPD, block status post pacemaker, hypertension, hyperlipidemia, seizures who presents with shortness of breath.  Patient states shortness of breath started 5 days ago and is progressively worsened since then.  He is now unable to walk 15 to 20 feet.  Is not worse with lying flat.  No associated chest pain, nausea, vomiting, fevers, blood in stool.  Patient does state he has had a mild cough productive of clear sputum.  Has had all of his Covid vaccinations.  No history of blood clots.  Patient went to urgent care this morning, was 83% on room air.  Placed on 2 L nasal cannula and sent to the emergency department for further evaluation.  He does not appear to be on any medications for his COPD and has no oxygen requirement at baseline.    Shortness of Breath Severity:  Severe Onset quality:  Gradual Duration:  5 days Timing:  Constant Progression:  Worsening Relieved by:  Oxygen Worsened by:  Exertion Ineffective treatments:  None tried Associated symptoms: cough   Associated symptoms: no abdominal pain, no chest pain, no ear pain, no fever, no rash, no sore throat and no vomiting        Past Medical History:  Diagnosis Date   Arteriovenous malformation    caused intracerebrial bleed w seizure -- post craniotomy 1980   Arthritis    Bilateral lower extremity edema    Bladder tumor    CAD (coronary artery disease) cardiologist--  dr Daneen Schick   a. 04/27/2016 PCI with DES to RCA with 50% ostial to 60% segmental mid to distal left main, and 90% thrombus filled ostial to proximal circumflex, with distal right coronary filled by collaterals from left-to-right   COPD with emphysema (HCC)     Dyspnea on exertion    ED (erectile dysfunction)    First degree AV block    GERD (gastroesophageal reflux disease)    History of adenomatous polyp of colon    09-22-2008   History of bladder cancer    s/p  resection bladder tumor 04-19-2016  non-invasive low-grade urothelial carcinoma   History of prostate cancer urologist-  dr Jeffie Pollock-- last PSA 0.02 (summer 2017)   dx 02/ 2013-- Stage T2a, Gleason 7, PSA 1.58--  s/p  radioactive prostate seed implants 01-19-2012   History of ST elevation myocardial infarction (STEMI)    04-27-2016   Hyperlipidemia    Hypertension    Macular degeneration    Presence of permanent cardiac pacemaker    S/P drug eluting coronary stent placement    04-27-2016    Second degree Mobitz I AV block    occasional w/ first degree heart block  per cardiologist note by dr Daneen Schick   Seizures [facility]) per pt son -- no seizure's since 1980   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  arteriorvenous cerebral malformation s/p  craniotomy 1980   Vitamin D deficiency     Patient Active Problem List   Diagnosis Date Noted   Chronic diastolic heart failure ([place]) 11/25/2019   High risk medication use 07/19/2019   Pseudophakia, both eyes 09/20/2018   Exudative age-related macular degeneration ([place]) 09/20/2018   Second  degree AV block 06/15/2017   Aortic atherosclerosis ([place]) 06/03/2016   General weakness    Old MI (myocardial infarction) 04/27/2016   Bladder cancer ([place]) 04/18/2016   Vitamin D deficiency 10/15/2015   Prostate cancer ([place])    Hypercholesterolemia    Essential hypertension    Seizures ([place])    GERD 08/22/2008   COLONIC POLYPS, ADENOMATOUS, HX OF 08/22/2008    Past Surgical History:  Procedure Laterality Date   APPENDECTOMY  2011   BLEPHAROPLASTY Bilateral revision 02-17-2016   CARDIAC CATHETERIZATION N/A 04/27/2016   Procedure: Left Heart Cath and Coronary Angiography;  Surgeon: Belva Crome, MD;   Location: [place] [facility];  Service: Cardiovascular;  Laterality: N/A;  acute inferior wall STEMI ;  ostCx to midCx 90%,  ost1st Mrg to 1st Mrg 50%,  mid to distal RCA 100%,  pRCA 80%,  LVEF 45-50% w/ inferobasal akinesis   CARDIAC CATHETERIZATION N/A 04/27/2016   Procedure: Temporary Pacemaker;  Surgeon: Belva Crome, MD;  Location: [place] [facility];  Service: Cardiovascular;  Laterality: N/A;  mobitz 2  second degree HB w/ heart rate 29bpm   CARDIAC CATHETERIZATION N/A 04/27/2016   Procedure: Coronary Stent Intervention;  Surgeon: Belva Crome, MD;  Location: [place] [facility];  Service: Cardiovascular;  Laterality: N/A; DES x1 to  Proximal RCA;  DES x1 to Mid RCA   CATARACT EXTRACTION W/ INTRAOCULAR LENS  IMPLANT, BILATERAL  2016  approx.   CRANIOTOMY  1980   intracranial bleed from arteriorvenous malformation (left parietal)   CYSTOSCOPY  01/19/2012   Procedure: CYSTOSCOPY;  Surgeon: Malka So, MD;  Location: [facility];  Service: Urology;;  no seeds found in bladder   CYSTOSCOPY W/ RETROGRADES Bilateral 04/19/2016   Procedure: CYSTOSCOPY WITH BILATERAL RETROGRADE PYELOGRAM TRANSURETHRAL RESECTION OF BLADDER TUMOR ;  Surgeon: [name], MD;  Location: [facility];  Service: Urology;  Laterality: Bilateral;   CYSTOSCOPY WITH BIOPSY N/A 11/10/2016   Procedure: CYSTOSCOPY WITH BIOPSY AND FULGURATION;  Surgeon: [name], MD;  Location: [facility];  Service: Urology;  Laterality: N/A;   INSERT / REPLACE / REMOVE PACEMAKER  06/15/2017   LEAD EXTRACTION N/A 01/22/2018   Procedure: LEAD EXTRACTION;  Surgeon: Evans Lance, MD;  Location: [place] [facility];  Service: Cardiovascular;  Laterality: N/A;   PACEMAKER IMPLANT N/A 06/15/2017   Procedure: Pacemaker Implant;  Surgeon: [name], MD;  Location: [place] [facility];  Service: Cardiovascular;  Laterality: N/A;   PACEMAKER IMPLANT N/A 01/25/2018   Procedure: PACEMAKER IMPLANT;  Surgeon: Evans Lance,  MD;  Location: [place] [facility];  Service: Cardiovascular;  Laterality: N/A;   PPM GENERATOR REMOVAL N/A 01/22/2018   Procedure: PPM GENERATOR REMOVAL;  Surgeon: Evans Lance, MD;  Location: [place] [facility];  Service: Cardiovascular;  Laterality: N/A;   RADIOACTIVE SEED IMPLANT  01/19/2012   Procedure: RADIOACTIVE SEED IMPLANT;  Surgeon: Malka So, MD;  Location: [facility];  Service: Urology;  Laterality: N/A;  27 seeds implanted   TRANSTHORACIC ECHOCARDIOGRAM  06/02/2016   ef 60-65%/  trivial MR/  mild TR       Family History  Problem Relation Age of Onset   Emphysema Mother        age 38 deceased   Stroke Mother    Emphysema Father        deceased age 48    Social History   Tobacco Use   Smoking  status: Former Smoker    Packs/day: 2.00    Years: 15.00    Pack years: 30.00    Quit date: 01/15/1973    Years since quitting: 47.4   Smokeless tobacco: Never Used  Vaping Use   Vaping Use: Never used  Substance Use Topics   Alcohol use: Yes    Comment: occasionally   Drug use: No    Home Medications Prior to Admission medications   Medication Sig Start Date End Date Taking? Authorizing Provider  atorvastatin (LIPITOR) 20 MG tablet Take 1 tablet (20 mg total) by mouth daily. 07/01/19   Baruch Gouty, FNP  calcium carbonate (OSCAL) 1500 (600 Ca) MG TABS tablet Take 600 mg of elemental calcium by mouth daily with breakfast.    [provider]  Cholecalciferol (VITAMIN D3) 1000 units CAPS Take 1 capsule by mouth daily.    [provider]  citalopram (CELEXA) 20 MG tablet Take 1 tablet (20 mg total) by mouth daily. 06/05/20   Hassell Done, Mary-Margaret, FNP  clopidogrel (PLAVIX) 75 MG tablet Take 1 tablet (75 mg total) by mouth daily. 07/01/19   Baruch Gouty, FNP  ferrous sulfate 325 (65 FE) MG tablet Take 1 tablet (325 mg total) by mouth daily with breakfast. 10/31/18   Gatha Mayer, MD  MAGNESIUM PO Take 1 tablet by mouth at  bedtime.    [provider]  Multiple Vitamins-Minerals (PRESERVISION AREDS 2) CAPS Take 1 capsule by mouth 2 (two) times daily.     [provider]  OVER THE COUNTER MEDICATION Generic heartburn releif    [provider]  PHENobarbital (LUMINAL) 64.8 MG tablet Take 1 tablet (64.8 mg total) by mouth 2 (two) times daily. 01/27/20   Hassell Done, Mary-Margaret, FNP  phenytoin (DILANTIN) 100 MG ER capsule TAKE (2) CAPSULES TWICE DAILY. 01/27/20   Hassell Done, Mary-Margaret, FNP  polyvinyl alcohol (LIQUIFILM TEARS) 1.4 % ophthalmic solution 1 drop as needed.    [provider]  spironolactone (ALDACTONE) 25 MG tablet Take 1 tablet (25 mg total) by mouth daily. 06/05/20   Chevis Pretty, FNP    Allergies    Penicillins  Review of Systems   Review of Systems  Constitutional: Negative for chills and fever.  HENT: Negative for ear pain and sore throat.   Eyes: Negative for pain and visual disturbance.  Respiratory: Positive for cough and shortness of breath.   Cardiovascular: Negative for chest pain and palpitations.  Gastrointestinal: Negative for abdominal pain and vomiting.  Genitourinary: Negative for dysuria and hematuria.  Musculoskeletal: Negative for arthralgias and back pain.  Skin: Negative for color change and rash.  Neurological: Negative for seizures and syncope.  All other systems reviewed and are negative.   Physical Exam Updated Vital Signs BP (!) 153/75    Pulse (!) 59    Temp 98.5 F (36.9 C)    Resp 19    SpO2 97%   Physical Exam Vitals and nursing note reviewed.  Constitutional:      Appearance: He is well-developed.  HENT:     Head: Normocephalic and atraumatic.  Eyes:     Conjunctiva/sclera: Conjunctivae normal.  Cardiovascular:     Rate and Rhythm: Normal rate and regular rhythm.     Heart sounds: No murmur heard.   Pulmonary:     Effort: Pulmonary effort is normal. No respiratory distress.     Breath sounds: Examination of the  right-upper field reveals decreased breath sounds. Examination of the left-upper field reveals decreased breath  sounds. Examination of the right-middle field reveals decreased breath sounds. Examination of the left-middle field reveals decreased breath sounds. Examination of the right-lower field reveals rales. Examination of the left-lower field reveals rales. Decreased breath sounds and rales present. No wheezing.  Chest:     Chest wall: No tenderness.  Abdominal:     Palpations: Abdomen is soft.     Tenderness: There is no abdominal tenderness.  Musculoskeletal:     Cervical back: Neck supple.     Right lower leg: No tenderness. No edema.     Left lower leg: No tenderness. No edema.  Skin:    General: Skin is warm and dry.  Neurological:     General: No focal deficit present.     Mental Status: He is alert and oriented to person, place, and time.     ED Results / Procedures / Treatments   Labs (all labs ordered are listed, but only abnormal results are displayed) Labs Reviewed  CBC WITH DIFFERENTIAL/PLATELET - Abnormal; Notable for the following components:      Result Value   RBC 3.14 (*)    Hemoglobin 9.6 (*)    HCT 29.7 (*)    RDW 17.2 (*)    nRBC 0.8 (*)    All other components within normal limits  COMPREHENSIVE METABOLIC PANEL - Abnormal; Notable for the following components:   Glucose, Bld 101 (*)    Total Protein 6.0 (*)    Albumin 3.4 (*)    Alkaline Phosphatase 136 (*)    All other components within normal limits  BRAIN NATRIURETIC PEPTIDE - Abnormal; Notable for the following components:   B Natriuretic Peptide 700.2 (*)    All other components within normal limits  TROPONIN I (HIGH SENSITIVITY) - Abnormal; Notable for the following components:   Troponin I (High Sensitivity) 197 (*)    All other components within normal limits  RESPIRATORY PANEL BY RT PCR (FLU A&B, COVID)  TROPONIN I (HIGH SENSITIVITY)    EKG EKG Interpretation  Date/Time:  Monday  June 22 2020 15:11:14 EDT Ventricular Rate:  65 PR Interval:    QRS Duration: 167 QT Interval:  471 QTC Calculation: 490 R Axis:   -64 Text Interpretation: A-V dual-paced rhythm with some inhibition No further analysis attempted due to paced rhythm Confirmed by Noemi Chapel [phone]) on 06/22/2020 3:51:41 PM   Radiology DG Chest Portable 1 View  Result Date: 06/22/2020 CLINICAL DATA:  Shortness of breath and hypoxia over the last 5 days. EXAM: PORTABLE CHEST 1 VIEW COMPARISON:  11/25/2019 FINDINGS: Pacemaker as seen previously. Heart size upper limits of normal. Chronic aortic atherosclerosis. Abnormal mid and lower lung infiltrates most consistent with pneumonia. Pulmonary edema less likely though not excluded. No visible effusion. No acute bone finding. IMPRESSION: Abnormal mid and lower lung infiltrates most consistent with pneumonia. Pulmonary edema less likely though not excluded. Electronically Signed   By: Nelson Chimes M.D.   On: 06/22/2020 16:44    Procedures Procedures (including critical care time)  Medications Ordered in ED Medications  aspirin chewable tablet 324 mg (has no administration in time range)  levofloxacin (LEVAQUIN) IVPB 750 mg (has no administration in time range)  albuterol (VENTOLIN HFA) 108 (90 Base) MCG/ACT inhaler 8 puff (8 puffs Inhalation Given 06/22/20 1636)  ipratropium (ATROVENT HFA) inhaler 4 puff (4 puffs Inhalation Given 06/22/20 1637)    ED Course  I have reviewed the triage vital signs and the nursing notes.  Pertinent labs & imaging results that  were available during my care of the patient were reviewed by me and considered in my medical decision making (see chart for details).    MDM Rules/Calculators/A&P                         Mr. Hervey Ard is an 86 year old man with a past medical history as above who presents with worsening dyspnea over the past 5 days.  Differential includes Covid, pneumonia, anemia, COPD exacerbation, CHF, ACS.  PE less  likely, Wells score 0.  No leukocytosis.  Hemoglobin 9.6, down from 11.4 in March. CMP unremarkable.  EKG with paced rhythm at a rate of 65, unchanged from prior.  No signs of acute ischemia.  Troponin 197.  BNP 700, but patient does not appear volume up.  Covid negative. Chest x-ray with infiltrate concerning for pneumonia.   Patient is likely having an NSTEMI and hypoxia secondary to an infectious pulmonary process. Patient is still not having chest pain. ASA given. Will treat for CAP and admit for further management.  This patient was seen with Dr. Sabra Heck.  Final Clinical Impression(s) / ED Diagnoses Final diagnoses:  NSTEMI (non-ST elevated myocardial infarction) ([place])  Acute hypoxemic respiratory failure ([place])  Community acquired pneumonia, unspecified laterality    Rx / DC Orders ED Discharge Orders    None       Asencion Noble, MD 06/22/20 2056    Noemi Chapel, MD 06/23/20 1452

## 2020-06-22 NOTE — ED Triage Notes (Signed)
Pt brought to ED via EMS from [facility] with co shortness of breath x5 days. Pt 83% on room air at [facility], improved to 98% on 2LNC. Hx emphysema, MI, pacemaker. Alert and oriented on arrival to ED, NAD.  EMS vs: 60 HR 117/76 BP

## 2020-06-22 NOTE — ED Provider Notes (Signed)
I saw and evaluated the patient, reviewed the resident's note and I agree with the findings and plan.  Pertinent History: COPD history - Heart block with pacemaker and CAD, comes with 5 days of dyspnea - 20 feet of ambulation - gives out - mild cough, some productive sputum - no CP.  83% on RA - at [facility]    Pertinent Exam findings: tachypneic, HR of 60, paced, lungs with poor aeration / dec air movement and rales at the bases, no edema.  EKG is paced, Albuterol / steroids CXR. - shows infiltrates  I was personally present and directly supervised the following procedures:  Medical rescucitation Critically ill with elevated troponin - needs admission to high level of care:  Paced rhythm, no CP - doubt primary occlusive disease - trop likely elevated 2/2 underlying stress.  .Critical Care Performed by: Noemi Chapel, MD Authorized by: Noemi Chapel, MD   Critical care provider statement:    Critical care time (minutes):  35   Critical care time was exclusive of:  Separately billable procedures and treating other patients and teaching time   Critical care was necessary to treat or prevent imminent or life-threatening deterioration of the following conditions:  Respiratory failure and cardiac failure   Critical care was time spent personally by me on the following activities:  Blood draw for specimens, development of treatment plan with patient or surrogate, discussions with consultants, evaluation of patient's response to treatment, examination of patient, obtaining history from patient or surrogate, ordering and performing treatments and interventions, ordering and review of laboratory studies, ordering and review of radiographic studies, pulse oximetry, re-evaluation of patient's condition and review of old charts     I personally interpreted the EKG as well as the resident and agree with the interpretation on the resident's chart.  Joshua [name] was evaluated in Emergency Department on  06/23/2020 for the symptoms described in the history of present illness. He was evaluated in the context of the global COVID-19 pandemic, which necessitated consideration that the patient might be at risk for infection with the SARS-CoV-2 virus that causes COVID-19. Institutional protocols and algorithms that pertain to the evaluation of patients at risk for COVID-19 are in a state of rapid change based on information released by regulatory bodies including the CDC and federal and state organizations. These policies and algorithms were followed during the patient's care in the ED.   Final diagnoses:  NSTEMI (non-ST elevated myocardial infarction) ([place])  Acute hypoxemic respiratory failure ([place])  Community acquired pneumonia, unspecified laterality    EKG Interpretation  Date/Time:  Monday June 22 2020 15:11:14 EDT Ventricular Rate:  65 PR Interval:    QRS Duration: 167 QT Interval:  471 QTC Calculation: 490 R Axis:   -64 Text Interpretation: A-V dual-paced rhythm with some inhibition No further analysis attempted due to paced rhythm Confirmed by Noemi Chapel [phone]) on 06/22/2020 3:51:41 PM         Noemi Chapel, MD 06/23/20 1452

## 2020-06-22 NOTE — H&P (Addendum)
History and Physical    Joshua [name] BTD:[id number] DOB: Dec 12, 1931 DOA: 06/22/2020  PCP: Chevis Pretty, FNP Patient coming from: Cardiology office  Chief Complaint: Shortness of breath  HPI: Joshua [name] is a 86 y.o. male with medical history significant of CAD status post PCI, chronic diastolic CHF, AV block requiring PPM, COPD, GERD, history of prostate treated with seed implant, history of bladder cancer, hypertension, hyperlipidemia, seizures (caused by intracranial bleed from AV malformation status post craniotomy in 1980) sent to the ED today from cardiology office for further evaluation of shortness of breath.  Patient reports having dyspnea on exertion for the past 4 days.  He is not coughing much.  Denies orthopnea or lower extremity edema.  Denies fevers or chills.  Denies chest pain.  States he takes an iron supplement for anemia and his stool is always dark.  Denies hematemesis or hematochezia.  Patient states a few years ago his PCP thought that he might be having a gastrointestinal bleed due to him having dark stools.  States endoscopy and colonoscopy were done within the past 5 years and he was told the results came back normal.  ED Course: Afebrile.  Not tachycardic or tachypneic.  Oxygen saturation 83% on room air, improved with 2 L supplemental oxygen.  WBC 8.4, hemoglobin 9.6 (baseline 11-12), hematocrit 29.7, platelet 296K.  Sodium 136, potassium 4.3, chloride 103, bicarb 23, BUN 21, creatinine 0.9, glucose 101.  BNP 700.  Initial high-sensitivity troponin 197, repeat pending.  EKG with paced rhythm.  SARS-CoV-2 PCR test negative.  Influenza panel negative.  Chest x-ray showing mid and lower lung infiltrates concerning for pneumonia; pulmonary edema less likely though not excluded.  Patient was given IV Levaquin (penicillin allergy).  He also received albuterol-ipratropium inhaler treatments, and aspirin 324 mg.  Review of Systems:  All systems reviewed and apart from  history of presenting illness, are negative.  Past Medical History:  Diagnosis Date  . 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 Arthritis   . Bilateral lower extremity edema   . Bladder tumor   . CAD (coronary artery disease) cardiologist--  dr Daneen Schick   a. 04/27/2016 PCI with DES to RCA with 50% ostial to 60% segmental mid to distal left main, and 90% thrombus filled ostial to proximal circumflex, with distal right coronary filled by collaterals from left-to-right  . COPD with emphysema ([place])   . Dyspnea on exertion   . ED (erectile dysfunction)   . First degree AV block   . GERD (gastroesophageal reflux disease)   . History of adenomatous polyp of colon    09-22-2008  . History of bladder cancer    s/p  resection bladder tumor 04-19-2016  non-invasive low-grade urothelial carcinoma  . History of prostate cancer urologist-  dr Jeffie Pollock-- last PSA 0.02 (summer 2017)   dx 02/ 2013-- Stage T2a, Gleason 7, PSA 1.58--  s/p  radioactive prostate seed implants 01-19-2012  . History of ST elevation myocardial infarction (STEMI)    04-27-2016  . Hyperlipidemia   . Hypertension   . Macular degeneration   . Presence of permanent cardiac pacemaker   . S/P drug eluting coronary stent placement    04-27-2016   . Second degree Mobitz I AV block    occasional w/ first degree heart block  per cardiologist note by dr Daneen Schick  . Seizures ([place]) per pt son -- no seizure's since 1980   1980 seizure  caused by intracranial bleed due to  arteriorvenous cerebral malformation s/p  craniotomy 1980  . Vitamin D deficiency     Past Surgical History:  Procedure Laterality Date  . APPENDECTOMY  2011  . BLEPHAROPLASTY Bilateral revision 02-17-2016  . CARDIAC CATHETERIZATION N/A 04/27/2016   Procedure: Left Heart Cath and Coronary Angiography;  Surgeon: Belva Crome, MD;  Location: [place];  Service: Cardiovascular;  Laterality: N/A;  acute  inferior wall STEMI ;  ostCx to midCx 90%,  ost1st Mrg to 1st Mrg 50%,  mid to distal RCA 100%,  pRCA 80%,  LVEF 45-50% w/ inferobasal akinesis  . CARDIAC CATHETERIZATION N/A 04/27/2016   Procedure: Temporary Pacemaker;  Surgeon: Belva Crome, MD;  Location: [place];  Service: Cardiovascular;  Laterality: N/A;  mobitz 2  second degree HB w/ heart rate 29bpm  . CARDIAC CATHETERIZATION N/A 04/27/2016   Procedure: Coronary Stent Intervention;  Surgeon: Belva Crome, MD;  Location: [place] [facility];  Service: Cardiovascular;  Laterality: N/A; DES x1 to  Proximal RCA;  DES x1 to Mid RCA  . CATARACT EXTRACTION W/ INTRAOCULAR LENS  IMPLANT, BILATERAL  2016  approx.  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 CYSTOSCOPY  01/19/2012   Procedure: CYSTOSCOPY;  Surgeon: Malka So, MD;  Location: [facility];  Service: Urology;;  no seeds found in bladder  . CYSTOSCOPY W/ RETROGRADES Bilateral 04/19/2016   Procedure: CYSTOSCOPY WITH BILATERAL RETROGRADE PYELOGRAM TRANSURETHRAL RESECTION OF BLADDER TUMOR ;  Surgeon: [name], MD;  Location: [facility];  Service: Urology;  Laterality: Bilateral;  . CYSTOSCOPY WITH BIOPSY N/A 11/10/2016   Procedure: CYSTOSCOPY WITH BIOPSY AND FULGURATION;  Surgeon: [name], MD;  [facility];  Service: Urology;  Laterality: N/A;  . INSERT / REPLACE / REMOVE PACEMAKER  06/15/2017  . LEAD EXTRACTION N/A 01/22/2018   Procedure: LEAD EXTRACTION;  Surgeon: Evans Lance, MD;  Location: [place] [facility];  Service: Cardiovascular;  Laterality: N/A;  . PACEMAKER IMPLANT N/A 06/15/2017   Procedure: Pacemaker Implant;  Surgeon: [name], MD;  Location: [place] [facility];  Service: Cardiovascular;  Laterality: N/A;  . PACEMAKER IMPLANT N/A 01/25/2018   Procedure: PACEMAKER IMPLANT;  Surgeon: Evans Lance, MD;  Location: [place] [facility];  Service: Cardiovascular;  Laterality: N/A;   . PPM GENERATOR REMOVAL N/A 01/22/2018   Procedure: PPM GENERATOR REMOVAL;  Surgeon: Evans Lance, MD;  Location: [place] [facility];  Service: Cardiovascular;  Laterality: N/A;  . RADIOACTIVE SEED IMPLANT  01/19/2012   Procedure: RADIOACTIVE SEED IMPLANT;  Surgeon: Malka So, MD;  Location[facility];  Service: Urology;  Laterality: N/A;  68 seeds implanted  . TRANSTHORACIC ECHOCARDIOGRAM  06/02/2016   ef 60-65%/  trivial MR/  mild TR     reports that he quit smoking about 47 years ago. He has a 30.00 pack-year smoking history. He has never used smokeless tobacco. He reports current alcohol use. He reports that he does not use drugs.  Allergies  Allergen Reactions  . Penicillins Rash    Has patient had a PCN reaction causing immediate rash, facial/tongue/throat swelling, SOB or lightheadedness with hypotension: ##Yes## Has patient had a PCN reaction causing severe rash involving mucus membranes or skin necrosis: No Has patient had a PCN reaction that required hospitalization No Has patient had a PCN reaction occurring within the last 10  years: No If all of the above answers are "NO", then may proceed with Cephalosporin use.     Family History  Problem Relation Age of Onset  . Emphysema Mother        age 24 deceased  . Stroke Mother   . Emphysema Father        deceased age 11    Prior to Admission medications   Medication Sig Start Date End Date Taking? Authorizing Provider  atorvastatin (LIPITOR) 20 MG tablet Take 1 tablet (20 mg total) by mouth daily. 07/01/19  Yes Rakes, Connye Burkitt, FNP  calcium carbonate (OSCAL) 1500 (600 Ca) MG TABS tablet Take 600 mg of elemental calcium by mouth daily with breakfast.   Yes [provider]  Cholecalciferol (VITAMIN D3) 1000 units CAPS Take 1 capsule by mouth daily.   Yes [provider]  clopidogrel (PLAVIX) 75 MG tablet Take 1 tablet (75 mg total) by mouth daily. 07/01/19  Yes Rakes, Connye Burkitt, FNP   Multiple Vitamins-Minerals (PRESERVISION AREDS 2) CAPS Take 1 capsule by mouth 2 (two) times daily.    Yes [provider]  phenytoin (DILANTIN) 100 MG ER capsule TAKE (2) CAPSULES TWICE DAILY. Patient taking differently: Take 200 mg by mouth 2 (two) times daily.  01/27/20  Yes Martin, Mary-Margaret, FNP  citalopram (CELEXA) 20 MG tablet Take 1 tablet (20 mg total) by mouth daily. 06/05/20   Hassell Done Mary-Margaret, FNP  ferrous sulfate 325 (65 FE) MG tablet Take 1 tablet (325 mg total) by mouth daily with breakfast. Patient not taking: Reported on 06/22/2020 10/31/18   Gatha Mayer, MD  MAGNESIUM PO Take 1 tablet by mouth at bedtime.    [provider]  PHENobarbital (LUMINAL) 64.8 MG tablet Take 1 tablet (64.8 mg total) by mouth 2 (two) times daily. 01/27/20   Hassell Done, Mary-Margaret, FNP  polyvinyl alcohol (LIQUIFILM TEARS) 1.4 % ophthalmic solution 1 drop as needed.    [provider]  spironolactone (ALDACTONE) 25 MG tablet Take 1 tablet (25 mg total) by mouth daily. 06/05/20   Chevis Pretty, FNP    Physical Exam: Vitals:   06/22/20 1530 06/22/20 1551 06/22/20 1900 06/22/20 2030  BP: (!) 153/75  137/75 120/66  Pulse: (!) 59  67 (!) 59  Resp: 19  18 (!) 24  Temp:    98.4 F (36.9 C)  TempSrc:    Oral  SpO2: 95% 97% 91% 91%    Physical Exam Constitutional:      General: He is not in acute distress. HENT:     Head: Normocephalic and atraumatic.  Eyes:     Extraocular Movements: Extraocular movements intact.     Conjunctiva/sclera: Conjunctivae normal.  Cardiovascular:     Rate and Rhythm: Normal rate and regular rhythm.     Pulses: Normal pulses.  Pulmonary:     Effort: Pulmonary effort is normal. No respiratory distress.     Breath sounds: No wheezing.     Comments: Satting in the mid 90s on 2 L supplemental oxygen No tachypnea or increased work of breathing Rales up to mid lung fields bilaterally Abdominal:     General: Bowel sounds are  normal.     Palpations: Abdomen is soft.     Tenderness: There is no abdominal tenderness. There is no guarding.  Musculoskeletal:        General: No swelling or tenderness.     Cervical back: Normal range of motion and neck supple.  Skin:    General: Skin  is warm and dry.  Neurological:     General: No focal deficit present.     Mental Status: He is alert and oriented to person, place, and time.     Labs on Admission: I have personally reviewed following labs and imaging studies  CBC: Recent Labs  Lab 06/22/20 1523  WBC 8.4  NEUTROABS 6.0  HGB 9.6*  HCT 29.7*  MCV 94.6  PLT 462   Basic Metabolic Panel: Recent Labs  Lab 06/22/20 1523  NA 136  K 4.3  CL 103  CO2 23  GLUCOSE 101*  BUN 21  CREATININE 0.91  CALCIUM 8.9   GFR: Estimated Creatinine Clearance: 65.9 mL/min (by C-G formula based on SCr of 0.91 mg/dL). Liver Function Tests: Recent Labs  Lab 06/22/20 1523  AST 41  ALT 37  ALKPHOS 136*  BILITOT 0.8  PROT 6.0*  ALBUMIN 3.4*   No results for input(s): LIPASE, AMYLASE in the last 168 hours. No results for input(s): AMMONIA in the last 168 hours. Coagulation Profile: No results for input(s): INR, PROTIME in the last 168 hours. Cardiac Enzymes: No results for input(s): CKTOTAL, CKMB, CKMBINDEX, TROPONINI in the last 168 hours. BNP (last 3 results) Recent Labs    08/21/19 1638  PROBNP 424   HbA1C: No results for input(s): HGBA1C in the last 72 hours. CBG: No results for input(s): GLUCAP in the last 168 hours. Lipid Profile: No results for input(s): CHOL, HDL, LDLCALC, TRIG, CHOLHDL, LDLDIRECT in the last 72 hours. Thyroid Function Tests: No results for input(s): TSH, T4TOTAL, FREET4, T3FREE, THYROIDAB in the last 72 hours. Anemia Panel: No results for input(s): VITAMINB12, FOLATE, FERRITIN, TIBC, IRON, RETICCTPCT in the last 72 hours. Urine analysis:    Component Value Date/Time   COLORURINE YELLOW 06/01/2016 1347   APPEARANCEUR CLOUDY (A)  06/01/2016 1347   APPEARANCEUR Clear 01/20/2016 0959   LABSPEC 1.026 06/01/2016 1347   PHURINE 7.0 06/01/2016 1347   GLUCOSEU NEGATIVE 06/01/2016 1347   HGBUR NEGATIVE 06/01/2016 [place] 06/01/2016 1347   BILIRUBINUR Negative 01/20/2016 0959   KETONESUR NEGATIVE 06/01/2016 1347   PROTEINUR NEGATIVE 06/01/2016 1347   UROBILINOGEN negative 09/10/2014 1233   UROBILINOGEN 0.2 09/23/2009 2141   NITRITE NEGATIVE 06/01/2016 1347   LEUKOCYTESUR TRACE (A) 06/01/2016 1347   LEUKOCYTESUR Negative 01/20/2016 0959    Radiological Exams on Admission: DG Chest Portable 1 View  Result Date: 06/22/2020 CLINICAL DATA:  Shortness of breath and hypoxia over the last 5 days. EXAM: PORTABLE CHEST 1 VIEW COMPARISON:  11/25/2019 FINDINGS: Pacemaker as seen previously. Heart size upper limits of normal. Chronic aortic atherosclerosis. Abnormal mid and lower lung infiltrates most consistent with pneumonia. Pulmonary edema less likely though not excluded. No visible effusion. No acute bone finding. IMPRESSION: Abnormal mid and lower lung infiltrates most consistent with pneumonia. Pulmonary edema less likely though not excluded. Electronically Signed   By: Nelson Chimes M.D.   On: 06/22/2020 16:44    EKG: Independently reviewed.  Paced rhythm.  Assessment/Plan Principal Problem:   CAP (community acquired pneumonia) Active Problems:   Hypercholesterolemia   Essential hypertension   CHF exacerbation (HCC)   Elevated troponin   Acute hypoxemic respiratory failure secondary to community-acquired pneumonia and acute on chronic diastolic CHF: Oxygen saturation 83% on room air, currently satting well on 2 L supplemental oxygen.  No fever, leukocytosis, or signs of sepsis.  Chest x-ray showing mid and lower lung infiltrates concerning for pneumonia.  Suspect there is likely pulmonary edema  as well as his BNP is elevated at 700 and significantly above baseline.   -Continue IV Levaquin (penicillin  allergy).  IV Lasix 40 mg x 1 ordered.  Please reassess in a.m. and order additional doses of Lasix as needed.  Monitor intake and output, daily weights, and low-sodium diet with fluid restriction.  Last echocardiogram in the chart is from 2017, repeat ordered.  Continuous pulse ox, supplemental oxygen. Addendum: Also checking procalcitonin level.  Elevated troponin History of CAD status post PCI High sensitive troponin 197 >266. EKG with paced rhythm.ACS less likely as patient is not endorsing chest pain and appears comfortable.  Suspect troponin elevation is due to demand ischemia in the setting of pneumonia and acutely decompensated CHF. -Cardiac monitoring.  Patient received full dose aspirin in the ED.  Will check another set of troponin, if uptrending start heparin for possible NSTEMI.   Addendum: Repeat troponin 294. Discussed with on-call cardiologist who agrees that this is likely due to demand ischemia. Recommending repeating another set of troponin with morning labs and if significantly elevated/above 1000, then consider starting heparin for possible ACS. Echocardiogram already ordered as mentioned above.  Normocytic anemia: Hemoglobin 9.6, MCV 94.6.  Baseline hemoglobin 11-12.  Patient reports dark stools on a chronic basis as he takes an iron supplement at home.  He had both endoscopy and colonoscopy done in February 2020.  Endoscopy with evidence of gastritis.  Colonoscopy normal. -Check anemia panel and FOBT.  He is not on a PPI at home, start Protonix 40 mg daily.  COPD: Stable.  No signs of acute exacerbation at this time. -DuoNeb as needed  Hypertension: Stable.  Currently normotensive. -Pharmacy med rec pending  Hyperlipidemia -Resume statin after pharmacy med rec is done  DVT prophylaxis: Lovenox Code Status: Patient wishes to be full code. Family Communication: No family available at this time. Disposition Plan: Status is: Inpatient  Remains inpatient appropriate  because:IV treatments appropriate due to intensity of illness or inability to take PO, Inpatient level of care appropriate due to severity of illness and New supplemental oxygen requirement   Dispo: The patient is from: Home              Anticipated d/c is to: Home              Anticipated d/c date is: 3 days              Patient currently is not medically stable to d/c.  The medical decision making on this patient was of high complexity and the patient is at high risk for clinical deterioration, therefore this is a level 3 visit.  Shela Leff MD [facility]  If 7PM-7AM, please contact night-coverage [email or web address]  06/22/2020, 8:56 PM

## 2020-06-22 NOTE — Progress Notes (Signed)
Pharmacy Antibiotic Note  DAELON DUNIVAN is a 86 y.o. male admitted on 06/22/2020 with pneumonia.  Pharmacy has been consulted for levofloxacin dosing. This is day 1 of therapy. One dose of levofloxacin has been given. Pt is afebrile with a WBC of 8.4.  Patient has a documented penicillin allergy with reaction of immediate rash.   Most recent QTc on 10/4: 490  Plan: Levofloxacin 750mg  IV q24h Monitor clinical progress, micro data, CBC, renal function, and QTc as able     Temp (24hrs), Avg:98.5 F (36.9 C), Min:98.5 F (36.9 C), Max:98.5 F (36.9 C)  Recent Labs  Lab 06/22/20 1523  WBC 8.4  CREATININE 0.91    Estimated Creatinine Clearance: 65.9 mL/min (by C-G formula based on SCr of 0.91 mg/dL).    Allergies  Allergen Reactions  . Penicillins Rash    Has patient had a PCN reaction causing immediate rash, facial/tongue/throat swelling, SOB or lightheadedness with hypotension: ##Yes## Has patient had a PCN reaction causing severe rash involving mucus membranes or skin necrosis: No Has patient had a PCN reaction that required hospitalization No Has patient had a PCN reaction occurring within the last 10 years: No If all of the above answers are "NO", then may proceed with Cephalosporin use.     Antimicrobials this admission: Levofloxacin 10/4>>   Microbiology results: 10/4 COVID, flu A, flu B: negative  Thank you for allowing pharmacy to be a part of this patient's care.  Carolin Guernsey  PGY1 pharmacy resident 06/22/2020 8:15 PM

## 2020-06-23 ENCOUNTER — Other Ambulatory Visit: Payer: Self-pay | Admitting: *Deleted

## 2020-06-23 ENCOUNTER — Inpatient Hospital Stay (HOSPITAL_COMMUNITY): Payer: Medicare Other

## 2020-06-23 DIAGNOSIS — I5033 Acute on chronic diastolic (congestive) heart failure: Secondary | ICD-10-CM

## 2020-06-23 DIAGNOSIS — I5031 Acute diastolic (congestive) heart failure: Secondary | ICD-10-CM | POA: Diagnosis not present

## 2020-06-23 DIAGNOSIS — I10 Essential (primary) hypertension: Secondary | ICD-10-CM

## 2020-06-23 DIAGNOSIS — R778 Other specified abnormalities of plasma proteins: Secondary | ICD-10-CM

## 2020-06-23 DIAGNOSIS — E7800 Pure hypercholesterolemia, unspecified: Secondary | ICD-10-CM

## 2020-06-23 LAB — IRON AND TIBC
Iron: 186 ug/dL — ABNORMAL HIGH (ref 45–182)
Saturation Ratios: 81 % — ABNORMAL HIGH (ref 17.9–39.5)
TIBC: 230 ug/dL — ABNORMAL LOW (ref 250–450)
UIBC: 44 ug/dL

## 2020-06-23 LAB — RETICULOCYTES
Immature Retic Fract: 25.5 % — ABNORMAL HIGH (ref 2.3–15.9)
RBC.: 3.03 MIL/uL — ABNORMAL LOW (ref 4.22–5.81)
Retic Count, Absolute: 82.7 10*3/uL (ref 19.0–186.0)
Retic Ct Pct: 2.7 % (ref 0.4–3.1)

## 2020-06-23 LAB — PROCALCITONIN: Procalcitonin: <0.1 ng/mL

## 2020-06-23 LAB — TROPONIN I (HIGH SENSITIVITY): Troponin I (High Sensitivity): 294 ng/L (ref <18)

## 2020-06-23 LAB — FOLATE: Folate: 12.8 ng/mL (ref >5.9)

## 2020-06-23 LAB — FERRITIN: Ferritin: 152 ng/mL (ref 24–336)

## 2020-06-23 LAB — VITAMIN B12: Vitamin B-12: 343 pg/mL (ref 180–914)

## 2020-06-23 MED ORDER — ATORVASTATIN CALCIUM 10 MG PO TABS
20.0000 mg | ORAL_TABLET | Freq: Every day | ORAL | Status: DC
  Administered 2020-06-23 – 2020-06-26 (×4): 20 mg via ORAL
  Filled 2020-06-23 (×4): qty 2

## 2020-06-23 MED ORDER — ASPIRIN EC 81 MG PO TBEC
81.0000 mg | DELAYED_RELEASE_TABLET | Freq: Every day | ORAL | Status: DC
  Administered 2020-06-23 – 2020-06-26 (×4): 81 mg via ORAL
  Filled 2020-06-23 (×4): qty 1

## 2020-06-23 MED ORDER — PHENOBARBITAL 32.4 MG PO TABS
64.8000 mg | ORAL_TABLET | Freq: Two times a day (BID) | ORAL | Status: DC
  Administered 2020-06-23 – 2020-06-26 (×7): 64.8 mg via ORAL
  Filled 2020-06-23 (×2): qty 2
  Filled 2020-06-23: qty 1
  Filled 2020-06-23 (×4): qty 2

## 2020-06-23 MED ORDER — CLOPIDOGREL BISULFATE 75 MG PO TABS
75.0000 mg | ORAL_TABLET | Freq: Every day | ORAL | Status: DC
  Administered 2020-06-23 – 2020-06-26 (×4): 75 mg via ORAL
  Filled 2020-06-23 (×4): qty 1

## 2020-06-23 MED ORDER — PHENYTOIN SODIUM EXTENDED 100 MG PO CAPS
200.0000 mg | ORAL_CAPSULE | Freq: Two times a day (BID) | ORAL | Status: DC
  Administered 2020-06-23 – 2020-06-26 (×7): 200 mg via ORAL
  Filled 2020-06-23 (×7): qty 2

## 2020-06-23 MED ORDER — SPIRONOLACTONE 25 MG PO TABS
25.0000 mg | ORAL_TABLET | Freq: Every day | ORAL | Status: DC
  Administered 2020-06-23 – 2020-06-26 (×4): 25 mg via ORAL
  Filled 2020-06-23 (×5): qty 1

## 2020-06-23 MED ORDER — FAMOTIDINE 20 MG PO TABS
20.0000 mg | ORAL_TABLET | Freq: Every day | ORAL | Status: DC
  Administered 2020-06-23 – 2020-06-26 (×4): 20 mg via ORAL
  Filled 2020-06-23 (×4): qty 1

## 2020-06-23 MED ORDER — FERROUS SULFATE 325 (65 FE) MG PO TABS
325.0000 mg | ORAL_TABLET | Freq: Every day | ORAL | Status: DC
  Administered 2020-06-24 – 2020-06-26 (×3): 325 mg via ORAL
  Filled 2020-06-23 (×4): qty 1

## 2020-06-23 MED ORDER — FUROSEMIDE 10 MG/ML IJ SOLN
40.0000 mg | Freq: Once | INTRAMUSCULAR | Status: AC
  Administered 2020-06-23: 40 mg via INTRAVENOUS
  Filled 2020-06-23: qty 4

## 2020-06-23 MED ORDER — CITALOPRAM HYDROBROMIDE 20 MG PO TABS
20.0000 mg | ORAL_TABLET | Freq: Every day | ORAL | Status: DC
  Administered 2020-06-23 – 2020-06-26 (×4): 20 mg via ORAL
  Filled 2020-06-23: qty 2
  Filled 2020-06-23 (×3): qty 1

## 2020-06-23 NOTE — Progress Notes (Signed)
  Echocardiogram 2D Echocardiogram has been performed.  Fidel Levy 06/23/2020, 5:10 PM

## 2020-06-23 NOTE — ED Notes (Signed)
Report given to Katie RN.

## 2020-06-23 NOTE — Progress Notes (Signed)
PROGRESS NOTE  Joshua [name] WNU:[id number] DOB: [birth year] DOA: 06/22/2020 PCP: Hassell Done Mary-Margaret, FNP   LOS: 1 day   Brief Narrative / Interim history: 86 year old male with CAD/PCI, chronic diastolic CHF, AV block status post PPM, COPD, prostate cancer, HTN, HLD, seizure disorder came in for evaluation of shortness of breath.  Reports dyspnea with exertion for the past 4 days.  In the ED he was found to be hypoxic satting 83% on room air requiring 2 L supplemental oxygen, and a chest x-ray showed mid and lower lung infiltrates concerning for pneumonia versus edema.  Covid negative.  He was placed on antibiotics, was given Lasix and admitted to the hospital.  Subjective / 24h Interval events: He is doing well at rest, however gets dyspneic with activities.  Feels slightly better.  Assessment & Plan: Principal Problem Acute hypoxic respiratory failure due to community-acquired pneumonia and acute on chronic diastolic CHF -Patient hypoxic on admission requiring supplemental oxygen.  Chest x-ray showed mid and lower lung infiltrates concerning for pneumonia, there is also concern for asymmetric pulmonary edema due to elevated BNP.  He received IV Lasix x1 in the ED, his renal function is stable and will give 1 additional dose of Lasix and reassess on a daily basis -Due to penicillin allergy he was started on IV Levaquin, continue. -Repeat 2D echo is pending  Active Problems History of coronary artery disease status post PCI, elevated troponin -High-sensitivity troponin elevated but overall flat.  He is not having any chest pain, this is less likely ACS and more likely demand ischemia -Continue cardiac monitoring, continue home medications.  Admitting MD discussed with on-call cardiologist who agrees that this is likely due to demand ischemia  Normocytic anemia-hemoglobin 9.6, baseline around 11.  He reports dark stool but he is on iron supplements.  In February 2020 he had an upper and  lower endoscopic evaluation by gastroenterology without significant findings.  COPD-stable, no wheezing, nebulizers as needed  Essential hypertension-resume home medications  Hyperlipidemia -Resume statin  History of seizures-continue home medications   Scheduled Meds: . aspirin EC  81 mg Oral Daily  . atorvastatin  20 mg Oral Daily  . citalopram  20 mg Oral Daily  . clopidogrel  75 mg Oral Daily  . enoxaparin (LOVENOX) injection  40 mg Subcutaneous Q24H  . famotidine  20 mg Oral Daily  . [START ON 06/24/2020] ferrous sulfate  325 mg Oral Q breakfast  . furosemide  40 mg Intravenous Once  . pantoprazole  40 mg Oral Daily  . PHENobarbital  64.8 mg Oral BID  . phenytoin  200 mg Oral BID  . spironolactone  25 mg Oral Daily   Continuous Infusions: . levofloxacin (LEVAQUIN) IV     PRN Meds:.acetaminophen **OR** acetaminophen  Diet Orders (From admission, onward)    Start     Ordered   06/22/20 1958  Diet Heart Room service appropriate? Yes; Fluid consistency: Thin; Fluid restriction: 1200 mL Fluid  Diet effective now       Question Answer Comment  Room service appropriate? Yes   Fluid consistency: Thin   Fluid restriction: 1200 mL Fluid      06/22/20 2003          DVT prophylaxis: enoxaparin (LOVENOX) injection 40 mg Start: 06/22/20 2015     Code Status: Full Code  Family Communication: no family at bedside   Status is: Inpatient  Remains inpatient appropriate because:Inpatient level of care appropriate due to severity of illness   Dispo:  The patient is from: Home              Anticipated d/c is to: Home              Anticipated d/c date is: 3 days              Patient currently is not medically stable to d/c.  Consultants:  None   Procedures:  None   Microbiology  None   Antimicrobials: Levaquin 10/4 >>    Objective: Vitals:   06/23/20 1000 06/23/20 1025 06/23/20 1030 06/23/20 1045  BP: 122/65  118/69   Pulse:    63  Resp:    13  Temp:        TempSrc:      SpO2:    92%  Weight:  83.5 kg    Height:  6\' 1"  (1.854 m)      Intake/Output Summary (Last 24 hours) at 06/23/2020 1114 Last data filed at 06/23/2020 1048 Gross per 24 hour  Intake --  Output 1300 ml  Net -1300 ml   Filed Weights   06/23/20 1025  Weight: 83.5 kg    Examination:  Constitutional: NAD Eyes: no scleral icterus ENMT: Mucous membranes are moist.  Neck: normal, supple Respiratory: Faint rhonchi at the bases, no wheezing, no crackles, moves air well Cardiovascular: Regular rate and rhythm, no murmurs / rubs / gallops. No LE edema.  Abdomen: non distended, no tenderness. Bowel sounds positive.  Musculoskeletal: no clubbing / cyanosis.  Skin: no rashes Neurologic: Nonfocal  Data Reviewed: I have independently reviewed following labs and imaging studies   CBC: Recent Labs  Lab 06/22/20 1523  WBC 8.4  NEUTROABS 6.0  HGB 9.6*  HCT 29.7*  MCV 94.6  PLT 517   Basic Metabolic Panel: Recent Labs  Lab 06/22/20 1523  NA 136  K 4.3  CL 103  CO2 23  GLUCOSE 101*  BUN 21  CREATININE 0.91  CALCIUM 8.9   Liver Function Tests: Recent Labs  Lab 06/22/20 1523  AST 41  ALT 37  ALKPHOS 136*  BILITOT 0.8  PROT 6.0*  ALBUMIN 3.4*   Coagulation Profile: No results for input(s): INR, PROTIME in the last 168 hours. HbA1C: No results for input(s): HGBA1C in the last 72 hours. CBG: No results for input(s): GLUCAP in the last 168 hours.  Recent Results (from the past 240 hour(s))  Respiratory Panel by RT PCR (Flu A&B, Covid) - Nasopharyngeal Swab     Status: None   Collection Time: 06/22/20  3:57 PM   Specimen: Nasopharyngeal Swab  Result Value Ref Range Status   SARS Coronavirus 2 by RT PCR NEGATIVE NEGATIVE Final    Comment: (NOTE) SARS-CoV-2 target nucleic acids are NOT DETECTED.  The SARS-CoV-2 RNA is generally detectable in upper respiratoy specimens during the acute phase of infection. The lowest concentration of SARS-CoV-2 viral  copies this assay can detect is 131 copies/mL. A negative result does not preclude SARS-Cov-2 infection and should not be used as the sole basis for treatment or other patient management decisions. A negative result may occur with  improper specimen collection/handling, submission of specimen other than nasopharyngeal swab, presence of viral mutation(s) within the areas targeted by this assay, and inadequate number of viral copies (<131 copies/mL). A negative result must be combined with clinical observations, patient history, and epidemiological information. The expected result is Negative.  Fact Sheet for Patients:  PinkCheek.be  Fact Sheet for Healthcare Providers:  GravelBags.it  This  test is no t yet approved or cleared by the [place] and  has been authorized for detection and/or diagnosis of SARS-CoV-2 by FDA under an Emergency Use Authorization (EUA). This EUA will remain  in effect (meaning this test can be used) for the duration of the COVID-19 declaration under Section 564(b)(1) of the Act, 21 U.S.C. section 360bbb-3(b)(1), unless the authorization is terminated or revoked sooner.     Influenza A by PCR NEGATIVE NEGATIVE Final   Influenza B by PCR NEGATIVE NEGATIVE Final    Comment: (NOTE) The Xpert Xpress SARS-CoV-2/FLU/RSV assay is intended as an aid in  the diagnosis of influenza from Nasopharyngeal swab specimens and  should not be used as a sole basis for treatment. Nasal washings and  aspirates are unacceptable for Xpert Xpress SARS-CoV-2/FLU/RSV  testing.  Fact Sheet for Patients: PinkCheek.be  Fact Sheet for Healthcare Providers: GravelBags.it  This test is not yet approved or cleared by the [place] FDA and  has been authorized for detection and/or diagnosis of SARS-CoV-2 by  FDA under an Emergency Use Authorization (EUA). This  EUA will remain  in effect (meaning this test can be used) for the duration of the  Covid-19 declaration under Section 564(b)(1) of the Act, 21  U.S.C. section 360bbb-3(b)(1), unless the authorization is  terminated or revoked. Performed at [facility], [place] [address] [place], [place] [address]      Radiology Studies: DG Chest Portable 1 View  Result Date: 06/22/2020 CLINICAL DATA:  Shortness of breath and hypoxia over the last 5 days. EXAM: PORTABLE CHEST 1 VIEW COMPARISON:  11/25/2019 FINDINGS: Pacemaker as seen previously. Heart size upper limits of normal. Chronic aortic atherosclerosis. Abnormal mid and lower lung infiltrates most consistent with pneumonia. Pulmonary edema less likely though not excluded. No visible effusion. No acute bone finding. IMPRESSION: Abnormal mid and lower lung infiltrates most consistent with pneumonia. Pulmonary edema less likely though not excluded. Electronically Signed   By: Nelson Chimes M.D.   On: 06/22/2020 16:44   Marzetta Board, MD, PhD [facility]  Between 7 am - 7 pm I am available, please contact me via Amion or Securechat  Between 7 pm - 7 am I am not available, please contact night coverage MD/APP via Amion

## 2020-06-24 ENCOUNTER — Inpatient Hospital Stay (HOSPITAL_COMMUNITY): Payer: Medicare Other

## 2020-06-24 LAB — CBC
HCT: 30.2 % — ABNORMAL LOW (ref 39.0–52.0)
Hemoglobin: 9.8 g/dL — ABNORMAL LOW (ref 13.0–17.0)
MCH: 30.8 pg (ref 26.0–34.0)
MCHC: 32.5 g/dL (ref 30.0–36.0)
MCV: 95 fL (ref 80.0–100.0)
Platelets: 318 10*3/uL (ref 150–400)
RBC: 3.18 MIL/uL — ABNORMAL LOW (ref 4.22–5.81)
RDW: 16.6 % — ABNORMAL HIGH (ref 11.5–15.5)
WBC: 9.5 10*3/uL (ref 4.0–10.5)
nRBC: 0.4 % — ABNORMAL HIGH (ref 0.0–0.2)

## 2020-06-24 LAB — COMPREHENSIVE METABOLIC PANEL
ALT: 29 U/L (ref 0–44)
AST: 30 U/L (ref 15–41)
Albumin: 3.1 g/dL — ABNORMAL LOW (ref 3.5–5.0)
Alkaline Phosphatase: 123 U/L (ref 38–126)
Anion gap: 10 (ref 5–15)
BUN: 26 mg/dL — ABNORMAL HIGH (ref 8–23)
CO2: 27 mmol/L (ref 22–32)
Calcium: 8.6 mg/dL — ABNORMAL LOW (ref 8.9–10.3)
Chloride: 100 mmol/L (ref 98–111)
Creatinine, Ser: 1.13 mg/dL (ref 0.61–1.24)
GFR calc non Af Amer: 59 mL/min — ABNORMAL LOW (ref >60)
Glucose, Bld: 85 mg/dL (ref 70–99)
Potassium: 3.4 mmol/L — ABNORMAL LOW (ref 3.5–5.1)
Sodium: 137 mmol/L (ref 135–145)
Total Bilirubin: 0.8 mg/dL (ref 0.3–1.2)
Total Protein: 5.7 g/dL — ABNORMAL LOW (ref 6.5–8.1)

## 2020-06-24 LAB — GLUCOSE, CAPILLARY: Glucose-Capillary: 102 mg/dL — ABNORMAL HIGH (ref 70–99)

## 2020-06-24 MED ORDER — LEVOFLOXACIN 500 MG PO TABS
750.0000 mg | ORAL_TABLET | Freq: Every day | ORAL | Status: DC
  Administered 2020-06-24 – 2020-06-25 (×2): 750 mg via ORAL
  Filled 2020-06-24 (×2): qty 2

## 2020-06-24 MED ORDER — POTASSIUM CHLORIDE CRYS ER 20 MEQ PO TBCR
40.0000 meq | EXTENDED_RELEASE_TABLET | Freq: Two times a day (BID) | ORAL | Status: AC
  Administered 2020-06-24 (×2): 40 meq via ORAL
  Filled 2020-06-24 (×2): qty 2

## 2020-06-24 MED ORDER — FUROSEMIDE 10 MG/ML IJ SOLN
40.0000 mg | Freq: Once | INTRAMUSCULAR | Status: AC
  Administered 2020-06-24: 40 mg via INTRAVENOUS
  Filled 2020-06-24: qty 4

## 2020-06-24 NOTE — Progress Notes (Signed)
PROGRESS NOTE    Joshua [name]  OIN:[id number] DOB: Jan 17, 1932 DOA: 06/22/2020 PCP: Chevis Pretty, FNP   Brief Narrative:  HPI per Dr. Shela Leff on 06/22/20 HPI: Joshua [name] is a 86 y.o. male with medical history significant of CAD status post PCI, chronic diastolic CHF, AV block requiring PPM, COPD, GERD, history of prostate treated with seed implant, history of bladder cancer, hypertension, hyperlipidemia, seizures (caused by intracranial bleed from AV malformation status post craniotomy in 1980) sent to the ED today from cardiology office for further evaluation of shortness of breath.  Patient reports having dyspnea on exertion for the past 4 days.  He is not coughing much.  Denies orthopnea or lower extremity edema.  Denies fevers or chills.  Denies chest pain.  States he takes an iron supplement for anemia and his stool is always dark.  Denies hematemesis or hematochezia.  Patient states a few years ago his PCP thought that he might be having a gastrointestinal bleed due to him having dark stools.  States endoscopy and colonoscopy were done within the past 5 years and he was told the results came back normal.  ED Course: Afebrile.  Not tachycardic or tachypneic.  Oxygen saturation 83% on room air, improved with 2 L supplemental oxygen.  WBC 8.4, hemoglobin 9.6 (baseline 11-12), hematocrit 29.7, platelet 296K.  Sodium 136, potassium 4.3, chloride 103, bicarb 23, BUN 21, creatinine 0.9, glucose 101.  BNP 700.  Initial high-sensitivity troponin 197, repeat pending.  EKG with paced rhythm.  SARS-CoV-2 PCR test negative.  Influenza panel negative.  Chest x-ray showing mid and lower lung infiltrates concerning for pneumonia; pulmonary edema less likely though not excluded.  Patient was given IV Levaquin (penicillin allergy).  He also received albuterol-ipratropium inhaler treatments, and aspirin 324 mg.  **Interim History Respiratory status is slightly improved but still requiring  supplemental oxygen via nasal cannula and very dyspneic on exertion.  Continuing diuresis and echocardiogram is pending.  Chest x-ray shows multifocal pneumonia and will continue Levaquin for now.  Assessment & Plan:   Principal Problem:   CAP (community acquired pneumonia) Active Problems:   Hypercholesterolemia   Essential hypertension   CHF exacerbation (HCC)   Elevated troponin  Acute Hypoxic respiratory failure due to community-acquired pneumonia and acute on chronic diastolic CHF -Patient hypoxic on admission (83% on Room Air) requiring supplemental oxygen and is still hypoxic. Per PT note "2LO2 to maintain SpO2 >88%, desat to 86% on RA" -Chest x-ray showed mid and lower lung infiltrates concerning for pneumonia, there is also concern for asymmetric pulmonary edema due to elevated BNP at 700.2.   -He received IV Lasix x1 in the ED, and given 1 additional dose of Lasix yesterday and will give 1 today and reassess on a daily basis -Strict I's and O's and Daily Weights; Patient is -2.8 Liters -Weight is down 7 lbs -Due to penicillin allergy he was started on IV Levaquin and will continue but change to po and continue for 5 more days. -Repeat 2D echo is pending read still  -SpO2: 92 % O2 Flow Rate (L/min): 2 L/min -Repeat CXR this AM showed "Mild subpleural patchy opacities in the right mid lung, lingula, and bilateral lower lobes, favoring multifocal pneumonia, grossly unchanged. Atypical/viral infection is possible. No pleural effusion or pneumothorax." -Continue to Monitor Respiratory Status Carefully and Wean O2 as tolerated -COVID Testing was Negative  -Continue to Monitor for S/Sx of Volume Overload   History of Coronary Artery Disease status post PCI  with DES to RCA with 50% ostial to 60% segmental mid to distal left main, and 90% thrombus filled ostial to proximal circumflex, with distal right coronary filled by collaterals from left-to-right Hx of STEMI Elevated Troponin -No  Active Chest Pain; Has a PPM -High-sensitivity troponin elevated but overall flat. Troponin went from 197 -> 266 -> 294 -> 294 -He is not having any chest pain, this is less likely ACS and more likely demand ischemia -Continue cardiac monitoring, continue home medications.   -Admitting MD discussed with on-call cardiologist who agrees that this is likely due to demand ischemia  Normocytic Anemia likely Iron Deficiency  -Hemoglobin 9.6 on Admission, baseline around 11.  Hgb/Hct is now 9.8/30.2 today -He reports dark stool but he is on iron supplements.   -C/w Ferrous Sulfate 325 mg po Daily -In February 2020 he had an upper and lower endoscopic evaluation by gastroenterology without significant findings. -Anemia Panel done and showed " iron level of 186, TIBC of 44, TIBC of 230, saturation ratios of 81%, ferritin level of 152, folate level of 12.8, and vitamin B12 of 343" -Continue to Monitor for S/Sx of Bleeding; Currently no overt bleeding noted -Repeat CBC in the AM   COPD -Stable, no wheezing, nebulizers as needed -As above  Essential Hypertension -Last BP was 138/52 this AM  -Resume home medications with Spironolactone 25 mg po Daily  -Continue to Monitor BP's per Protocol   Hyperlipidemia -C/w Atorvastatin 20 mg po Daily   History of Seizures -Continue home medications of Phenytonin 200 mg po BID and Phenobarbital 64.8 mg po BID   Hypokalemia -Mild at 3.4 -Replete with po KCl 40 mEQ BID x2 -Continue to Monitor and Replete as Necessary -Repeat CMP in the AM   GERD -C/w Pantoprazole 40 mg po Daily  DVT prophylaxis: Enoxaparin 40 mg sq q24h Code Status: FULL CODE Family Communication: No family present at bedside  Disposition Plan: The improvement in respiratory status and weaning of oxygen to baseline; PT OT recommending home health but he still requires supplemental oxygen via nasal cannula and was 86% on room air  Status is: Inpatient  Remains inpatient  appropriate because:Unsafe d/c plan, IV treatments appropriate due to intensity of illness or inability to take PO and Inpatient level of care appropriate due to severity of illness   Dispo: The patient is from: Home              Anticipated d/c is to: Home              Anticipated d/c date is:1-2 days              Patient currently is not medically stable to d/c.  Consultants:   None  Procedures:  ECHOCARDIOGRAM 06/23/20 -Done and Pending Read   Antimicrobials: Anti-infectives (From admission, onward)   Start     Dose/Rate Route Frequency Ordered Stop   06/23/20 1800  levofloxacin (LEVAQUIN) IVPB 750 mg        750 mg 100 mL/hr over 90 Minutes Intravenous Every 24 hours 06/22/20 2013     06/22/20 1815  levofloxacin (LEVAQUIN) IVPB 750 mg        750 mg 100 mL/hr over 90 Minutes Intravenous  Once 06/22/20 1807 06/22/20 2041        Subjective: And examined at bedside and states that his respiratory status is stable however still remains on oxygen.  Feels little bit better today.  Still feels somewhat weak and wants to get out of  bed.  No nausea or vomiting.  Denies any lightheadedness or dizziness.  No other concerns or complaints this time.  Objective: Vitals:   06/23/20 1900 06/23/20 2349 06/24/20 0412 06/24/20 0622  BP: (!) 135/56 (!) 113/54 (!) 112/52   Pulse: 64 64 63   Resp: 20 19 20    Temp: 97.9 F (36.6 C) 98.6 F (37 C) 97.9 F (36.6 C)   TempSrc: Axillary Axillary Oral   SpO2: 90% 95% 96%   Weight:    80.7 kg  Height:        Intake/Output Summary (Last 24 hours) at 06/24/2020 0737 Last data filed at 06/23/2020 2213 Gross per 24 hour  Intake --  Output 2300 ml  Net -2300 ml   Filed Weights   06/23/20 1025 06/24/20 0622  Weight: 83.5 kg 80.7 kg   Examination: Physical Exam:  Constitutional: WN/WD elderly Caucasian male in NAD and appears calm  Eyes: Lids and conjunctivae normal, sclerae anicteric  ENMT: External Ears, Nose appear normal. Grossly  normal hearing. Neck: Appears normal, supple, no cervical masses, normal [name], no appreciable thyromegaly; no JVD Respiratory: Diminished to auscultation bilaterally with coarse breath sounds and some rhonchi and crackles. No wheezing, rales noted. Normal respiratory effort and patient is not tachypenic. No accessory muscle use. Wearing Supplemental O2 via  Cardiovascular: RRR, no murmurs / rubs / gallops. S1 and S2 auscultated. Trace extremity edema.  Abdomen: Soft, non-tender, mildly distended. Bowel sounds positive.  GU: Deferred. Musculoskeletal: No clubbing / cyanosis of digits/nails. No joint deformity upper and lower extremities.  Skin: No rashes, lesions, ulcers on a limited skin evaluation. No induration; Warm and dry.  Neurologic: CN 2-12 grossly intact with no focal deficits. Romberg sign and cerebellar reflexes not assessed.  Psychiatric: Normal judgment and insight. Alert and oriented x 3. Normal mood and appropriate affect.   Data Reviewed: I have personally reviewed following labs and imaging studies  CBC: Recent Labs  Lab 06/22/20 1523 06/24/20 0234  WBC 8.4 9.5  NEUTROABS 6.0  --   HGB 9.6* 9.8*  HCT 29.7* 30.2*  MCV 94.6 95.0  PLT 296 737   Basic Metabolic Panel: Recent Labs  Lab 06/22/20 1523 06/24/20 0234  NA 136 137  K 4.3 3.4*  CL 103 100  CO2 23 27  GLUCOSE 101* 85  BUN 21 26*  CREATININE 0.91 1.13  CALCIUM 8.9 8.6*   GFR: Estimated Creatinine Clearance: 53 mL/min (by C-G formula based on SCr of 1.13 mg/dL). Liver Function Tests: Recent Labs  Lab 06/22/20 1523 06/24/20 0234  AST 41 30  ALT 37 29  ALKPHOS 136* 123  BILITOT 0.8 0.8  PROT 6.0* 5.7*  ALBUMIN 3.4* 3.1*   No results for input(s): LIPASE, AMYLASE in the last 168 hours. No results for input(s): AMMONIA in the last 168 hours. Coagulation Profile: No results for input(s): INR, PROTIME in the last 168 hours. Cardiac Enzymes: No results for input(s): CKTOTAL, CKMB, CKMBINDEX,  TROPONINI in the last 168 hours. BNP (last 3 results) Recent Labs    08/21/19 1638  PROBNP 424   HbA1C: No results for input(s): HGBA1C in the last 72 hours. CBG: No results for input(s): GLUCAP in the last 168 hours. Lipid Profile: No results for input(s): CHOL, HDL, LDLCALC, TRIG, CHOLHDL, LDLDIRECT in the last 72 hours. Thyroid Function Tests: No results for input(s): TSH, T4TOTAL, FREET4, T3FREE, THYROIDAB in the last 72 hours. Anemia Panel: Recent Labs    06/23/20 0347  [id number] 627  OJJKKX  12.8  FERRITIN 152  TIBC 230*  IRON 186*  RETICCTPCT 2.7   Sepsis Labs: Recent Labs  Lab 06/23/20 0347  PROCALCITON <0.10    Recent Results (from the past 240 hour(s))  Respiratory Panel by RT PCR (Flu A&B, Covid) - Nasopharyngeal Swab     Status: None   Collection Time: 06/22/20  3:57 PM   Specimen: Nasopharyngeal Swab  Result Value Ref Range Status   SARS Coronavirus 2 by RT PCR NEGATIVE NEGATIVE Final    Comment: (NOTE) SARS-CoV-2 target nucleic acids are NOT DETECTED.  The SARS-CoV-2 RNA is generally detectable in upper respiratoy specimens during the acute phase of infection. The lowest concentration of SARS-CoV-2 viral copies this assay can detect is 131 copies/mL. A negative result does not preclude SARS-Cov-2 infection and should not be used as the sole basis for treatment or other patient management decisions. A negative result may occur with  improper specimen collection/handling, submission of specimen other than nasopharyngeal swab, presence of viral mutation(s) within the areas targeted by this assay, and inadequate number of viral copies (<131 copies/mL). A negative result must be combined with clinical observations, patient history, and epidemiological information. The expected result is Negative.  Fact Sheet for Patients:  PinkCheek.be  Fact Sheet for Healthcare Providers:   GravelBags.it  This test is no t yet approved or cleared by the [place] FDA and  has been authorized for detection and/or diagnosis of SARS-CoV-2 by FDA under an Emergency Use Authorization (EUA). This EUA will remain  in effect (meaning this test can be used) for the duration of the COVID-19 declaration under Section 564(b)(1) of the Act, 21 U.S.C. section 360bbb-3(b)(1), unless the authorization is terminated or revoked sooner.     Influenza A by PCR NEGATIVE NEGATIVE Final   Influenza B by PCR NEGATIVE NEGATIVE Final    Comment: (NOTE) The Xpert Xpress SARS-CoV-2/FLU/RSV assay is intended as an aid in  the diagnosis of influenza from Nasopharyngeal swab specimens and  should not be used as a sole basis for treatment. Nasal washings and  aspirates are unacceptable for Xpert Xpress SARS-CoV-2/FLU/RSV  testing.  Fact Sheet for Patients: PinkCheek.be  Fact Sheet for Healthcare Providers: GravelBags.it  This test is not yet approved or cleared by the [place] FDA and  has been authorized for detection and/or diagnosis of SARS-CoV-2 by  FDA under an Emergency Use Authorization (EUA). This EUA will remain  in effect (meaning this test can be used) for the duration of the  Covid-19 declaration under Section 564(b)(1) of the Act, 21  U.S.C. section 360bbb-3(b)(1), unless the authorization is  terminated or revoked. Performed at [facility], [place] [address], [place], [place] [address]      RN Pressure Injury Documentation:     Estimated body mass index is 23.47 kg/m as calculated from the following:   Height as of this encounter: 6\' 1"  (1.854 m).   Weight as of this encounter: 80.7 kg.  Malnutrition Type:      Malnutrition Characteristics:    Nutrition Interventions:        Radiology Studies: DG Chest Portable 1 View  Result Date: 06/22/2020 CLINICAL DATA:   Shortness of breath and hypoxia over the last 5 days. EXAM: PORTABLE CHEST 1 VIEW COMPARISON:  11/25/2019 FINDINGS: Pacemaker as seen previously. Heart size upper limits of normal. Chronic aortic atherosclerosis. Abnormal mid and lower lung infiltrates most consistent with pneumonia. Pulmonary edema less likely though not excluded. No visible effusion. No acute  bone finding. IMPRESSION: Abnormal mid and lower lung infiltrates most consistent with pneumonia. Pulmonary edema less likely though not excluded. Electronically Signed   By: Nelson Chimes M.D.   On: 06/22/2020 16:44   Scheduled Meds: . aspirin EC  81 mg Oral Daily  . atorvastatin  20 mg Oral Daily  . citalopram  20 mg Oral Daily  . clopidogrel  75 mg Oral Daily  . enoxaparin (LOVENOX) injection  40 mg Subcutaneous Q24H  . famotidine  20 mg Oral Daily  . ferrous sulfate  325 mg Oral Q breakfast  . pantoprazole  40 mg Oral Daily  . PHENobarbital  64.8 mg Oral BID  . phenytoin  200 mg Oral BID  . potassium chloride  40 mEq Oral BID  . spironolactone  25 mg Oral Daily   Continuous Infusions: . levofloxacin (LEVAQUIN) IV 750 mg (06/23/20 1843)    LOS: 2 days   Kerney Elbe, DO [facility] PAGER is on AMION  If 7PM-7AM, please contact night-coverage [email or web address]

## 2020-06-24 NOTE — Plan of Care (Signed)
?  Problem: Clinical Measurements: ?Goal: Ability to maintain a body temperature in the normal range will improve ?Outcome: Progressing ?  ?Problem: Respiratory: ?Goal: Ability to maintain adequate ventilation will improve ?Outcome: Progressing ?  ?Problem: Respiratory: ?Goal: Ability to maintain a clear airway will improve ?Outcome: Progressing ?  ?

## 2020-06-24 NOTE — Evaluation (Addendum)
Physical Therapy Evaluation Patient Details Name: Joshua [name] MRN: [id number] DOB: 10-29-1931 Today's Date: 06/24/2020   History of Present Illness  86 year old male with CAD/PCI, chronic diastolic CHF, AV block status post PPM, COPD, prostate cancer, HTN, HLD, seizure disorder admitted to ED on 10/4 with shortness of breath secondary to PNA, CHF exacerbation.  Clinical Impression   Pt presents with generalized weakness specifically impaired LE strength R>L pt stating secondary to seizure and brain operation in 1980, poor standing balance with history of nearly daily falls, decreased activity tolerance, and dyspnea on exertion. Pt to benefit from acute PT to address deficits. Pt ambulated room distance with min assist to steady and correct RLE buckling, pt requiring use of RW today and at baseline pt uses a walking stick and furniture as external support. Pt politely refusing SNF level of care post-acutely, lives alone and states he is still driving and going to the office in the afternoons. As secondary option, PT recommending HHPT, OT consult placed. PT to progress mobility as tolerated, and will continue to follow acutely.   2LO2 to maintain SpO2 >88%, desat to 86% on RA   Follow Up Recommendations Home health PT;Supervision for mobility/OOB    Equipment Recommendations  Other (comment) (TBD next session, possibly rollator)    Recommendations for Other Services OT consult     Precautions / Restrictions Precautions Precautions: Fall Restrictions Weight Bearing Restrictions: No      Mobility  Bed Mobility Overal bed mobility: Needs Assistance Bed Mobility: Supine to Sit     Supine to sit: Min guard;HOB elevated     General bed mobility comments: for safety, increased time with use of HOB elevation and bedrails to perform.  Transfers Overall transfer level: Needs assistance Equipment used: Rolling walker (2 wheeled) Transfers: Sit to/from Stand Sit to Stand: Min  assist;From elevated surface         General transfer comment: Min assist for power up, steadying upon standing. Slow to rise and steady, requires assist for slow eccentric lowering.  Ambulation/Gait Ambulation/Gait assistance: Min assist Gait Distance (Feet): 30 Feet (to and from bathroom) Assistive device: Rolling walker (2 wheeled) Gait Pattern/deviations: Step-through pattern;Decreased stride length;Trunk flexed Gait velocity: decr   General Gait Details: Min assist to steady, support trunk with x2 R knee buckling episodes. Verbal cuing for placement in RW, upright posture. SpO2 86% on RA, requiring 2LO2 and seated rest to recover sats.  Stairs            Wheelchair Mobility    Modified Rankin (Stroke Patients Only)       Balance Overall balance assessment: Needs assistance;History of Falls Sitting-balance support: No upper extremity supported;Feet supported Sitting balance-Leahy Scale: Fair     Standing balance support: Bilateral upper extremity supported;During functional activity Standing balance-Leahy Scale: Poor Standing balance comment: reliant on external assist                             Pertinent Vitals/Pain Pain Assessment: No/denies pain    Home Living Family/patient expects to be discharged to:: Private residence Living Arrangements: Alone Available Help at Discharge: Family;Available PRN/intermittently (son lives locally) Type of Home: House Home Access: Stairs to enter Entrance Stairs-Rails: Left Entrance Stairs-Number of Steps: 1 Home Layout: Two level;Bed/bath upstairs;Able to live on main level with bedroom/bathroom Home Equipment: Other (comment);Grab bars - tub/shower;Walker - 2 wheels Additional Comments: walking stick    Prior Function Level of Independence: Needs assistance  Gait / Transfers Assistance Needed: Ambulatory with walking stick, pt endorses nearly daily falls  ADL's / Homemaking Assistance Needed: Pt has  [facility] 2 hours a day M-F to assist with household tasks like meal prep, grocery shopping. Also has cleaning lady once a week.        Hand Dominance   Dominant Hand: Right    Extremity/Trunk Assessment   Upper Extremity Assessment Upper Extremity Assessment: Defer to OT evaluation    Lower Extremity Assessment Lower Extremity Assessment: Generalized weakness    Cervical / Trunk Assessment Cervical / Trunk Assessment: Kyphotic  Communication   Communication: No difficulties  Cognition Arousal/Alertness: Awake/alert Behavior During Therapy: WFL for tasks assessed/performed Overall Cognitive Status: Within Functional Limits for tasks assessed                                 General Comments: Lack of insight into deficits, poor safety awareness      General Comments General comments (skin integrity, edema, etc.): Required 2LO2 to maintain sats >88% during mobility    Exercises     Assessment/Plan    PT Assessment Patient needs continued PT services  PT Problem List Decreased strength;Decreased mobility;Decreased safety awareness;Decreased activity tolerance;Decreased balance;Decreased knowledge of use of DME;Cardiopulmonary status limiting activity       PT Treatment Interventions DME instruction;Therapeutic activities;Gait training    PT Goals (Current goals can be found in the Care Plan section)  Acute Rehab PT Goals Patient Stated Goal: go home PT Goal Formulation: With patient/family Time For Goal Achievement: 07/08/20 Potential to Achieve Goals: Fair    Frequency Min 3X/week   Barriers to discharge Decreased caregiver support Pt politely refusing SNF level of care post-acutely, lives alone    Co-evaluation               AM-PAC PT "6 Clicks" Mobility  Outcome Measure Help needed turning from your back to your side while in a flat bed without using bedrails?: A Little Help needed moving from lying on your back to sitting on the side of a  flat bed without using bedrails?: A Little Help needed moving to and from a bed to a chair (including a wheelchair)?: A Little Help needed standing up from a chair using your arms (e.g., wheelchair or bedside chair)?: A Little Help needed to walk in hospital room?: A Little Help needed climbing 3-5 steps with a railing? : A Lot 6 Click Score: 17    End of Session Equipment Utilized During Treatment: Oxygen Activity Tolerance: Patient tolerated treatment well;Patient limited by fatigue Patient left: in chair;with call bell/phone within reach;with chair alarm set;with family/visitor present Nurse Communication: Mobility status PT Visit Diagnosis: Other abnormalities of gait and mobility (R26.89);Difficulty in walking, not elsewhere classified (R26.2)    Time: 1218-1228; 1240-1300 PT Time Calculation (min) (ACUTE ONLY): 30 min   Charges:   PT Evaluation $PT Eval Low Complexity: 1 Low PT Treatments $Gait Training: 8-22 mins        Oretta Berkland E, PT Acute Rehabilitation Services Pager [phone]  Office [phone]    Farida Mcreynolds D Ranada Vigorito 06/24/2020, 1:12 PM

## 2020-06-25 ENCOUNTER — Inpatient Hospital Stay (HOSPITAL_COMMUNITY): Payer: Medicare Other

## 2020-06-25 DIAGNOSIS — J849 Interstitial pulmonary disease, unspecified: Secondary | ICD-10-CM | POA: Diagnosis not present

## 2020-06-25 LAB — COMPREHENSIVE METABOLIC PANEL
ALT: 24 U/L (ref 0–44)
AST: 27 U/L (ref 15–41)
Albumin: 3 g/dL — ABNORMAL LOW (ref 3.5–5.0)
Alkaline Phosphatase: 122 U/L (ref 38–126)
Anion gap: 9 (ref 5–15)
BUN: 25 mg/dL — ABNORMAL HIGH (ref 8–23)
CO2: 24 mmol/L (ref 22–32)
Calcium: 8.4 mg/dL — ABNORMAL LOW (ref 8.9–10.3)
Chloride: 105 mmol/L (ref 98–111)
Creatinine, Ser: 1.04 mg/dL (ref 0.61–1.24)
GFR calc non Af Amer: >60 mL/min (ref >60)
Glucose, Bld: 100 mg/dL — ABNORMAL HIGH (ref 70–99)
Potassium: 4 mmol/L (ref 3.5–5.1)
Sodium: 138 mmol/L (ref 135–145)
Total Bilirubin: 0.8 mg/dL (ref 0.3–1.2)
Total Protein: 5.7 g/dL — ABNORMAL LOW (ref 6.5–8.1)

## 2020-06-25 LAB — CBC WITH DIFFERENTIAL/PLATELET
Abs Immature Granulocytes: 0.08 10*3/uL — ABNORMAL HIGH (ref 0.00–0.07)
Basophils Absolute: 0.1 10*3/uL (ref 0.0–0.1)
Basophils Relative: 1 %
Eosinophils Absolute: 0.5 10*3/uL (ref 0.0–0.5)
Eosinophils Relative: 4 %
HCT: 30.2 % — ABNORMAL LOW (ref 39.0–52.0)
Hemoglobin: 10.1 g/dL — ABNORMAL LOW (ref 13.0–17.0)
Immature Granulocytes: 1 %
Lymphocytes Relative: 13 %
Lymphs Abs: 1.8 10*3/uL (ref 0.7–4.0)
MCH: 31.8 pg (ref 26.0–34.0)
MCHC: 33.4 g/dL (ref 30.0–36.0)
MCV: 95 fL (ref 80.0–100.0)
Monocytes Absolute: 1.9 10*3/uL — ABNORMAL HIGH (ref 0.1–1.0)
Monocytes Relative: 14 %
Neutro Abs: 9.1 10*3/uL — ABNORMAL HIGH (ref 1.7–7.7)
Neutrophils Relative %: 67 %
Platelets: 362 10*3/uL (ref 150–400)
RBC: 3.18 MIL/uL — ABNORMAL LOW (ref 4.22–5.81)
RDW: 16.8 % — ABNORMAL HIGH (ref 11.5–15.5)
WBC: 13.5 10*3/uL — ABNORMAL HIGH (ref 4.0–10.5)
nRBC: 0.2 % (ref 0.0–0.2)

## 2020-06-25 LAB — ECHOCARDIOGRAM COMPLETE
Height: 73 in
S' Lateral: 3.54 cm
Weight: 2944 oz

## 2020-06-25 LAB — PHOSPHORUS: Phosphorus: 2.9 mg/dL (ref 2.5–4.6)

## 2020-06-25 LAB — MAGNESIUM: Magnesium: 1.8 mg/dL (ref 1.7–2.4)

## 2020-06-25 MED ORDER — FUROSEMIDE 10 MG/ML IJ SOLN
40.0000 mg | Freq: Once | INTRAMUSCULAR | Status: AC
  Administered 2020-06-25: 40 mg via INTRAVENOUS
  Filled 2020-06-25: qty 4

## 2020-06-25 MED ORDER — MAGNESIUM SULFATE IN D5W 1-5 GM/100ML-% IV SOLN
1.0000 g | Freq: Once | INTRAVENOUS | Status: DC
  Filled 2020-06-25: qty 100

## 2020-06-25 MED ORDER — GUAIFENESIN ER 600 MG PO TB12
1200.0000 mg | ORAL_TABLET | Freq: Two times a day (BID) | ORAL | Status: DC
  Administered 2020-06-25 – 2020-06-26 (×3): 1200 mg via ORAL
  Filled 2020-06-25 (×3): qty 2

## 2020-06-25 NOTE — Progress Notes (Signed)
PROGRESS NOTE    [name]  WPY:[id number] DOB: Oct 06, 1931 DOA: 06/22/2020 PCP: Chevis Pretty, FNP   Brief Narrative:  HPI per Dr. Shela Leff on 06/22/20 HPI: Joshua [name] is a 86 y.o. male with medical history significant of CAD status post PCI, chronic diastolic CHF, AV block requiring PPM, COPD, GERD, history of prostate treated with seed implant, history of bladder cancer, hypertension, hyperlipidemia, seizures (caused by intracranial bleed from AV malformation status post craniotomy in 1980) sent to the ED today from cardiology office for further evaluation of shortness of breath.  Patient reports having dyspnea on exertion for the past 4 days.  He is not coughing much.  Denies orthopnea or lower extremity edema.  Denies fevers or chills.  Denies chest pain.  States he takes an iron supplement for anemia and his stool is always dark.  Denies hematemesis or hematochezia.  Patient states a few years ago his PCP thought that he might be having a gastrointestinal bleed due to him having dark stools.  States endoscopy and colonoscopy were done within the past 5 years and he was told the results came back normal.  ED Course: Afebrile.  Not tachycardic or tachypneic.  Oxygen saturation 83% on room air, improved with 2 L supplemental oxygen.  WBC 8.4, hemoglobin 9.6 (baseline 11-12), hematocrit 29.7, platelet 296K.  Sodium 136, potassium 4.3, chloride 103, bicarb 23, BUN 21, creatinine 0.9, glucose 101.  BNP 700.  Initial high-sensitivity troponin 197, repeat pending.  EKG with paced rhythm.  SARS-CoV-2 PCR test negative.  Influenza panel negative.  Chest x-ray showing mid and lower lung infiltrates concerning for pneumonia; pulmonary edema less likely though not excluded.  Patient was given IV Levaquin (penicillin allergy).  He also received albuterol-ipratropium inhaler treatments, and aspirin 324 mg.  **Interim History Respiratory status is slightly improved but still requiring  supplemental oxygen via nasal cannula and very dyspneic on exertion continues to drop his O2 saturation so will obtain a CT of the chest with contrast.  Continuing diuresis and echocardiogram shows an EF of 50 to 05%, grade 1 diastolic CHF.  Chest x-ray shows multifocal pneumonia and today show that he has stable cardiomegaly but diffuse bilateral interstitial prominence again slightly but was progressed from prior exam.  Residual edema or pneumonitis could also present in this fashion.  We will continue IV diuresis and give him twice daily dosing today.  We will also obtain a CT of the chest for further evaluation.  Patient had another ambulatory O2 screen and  O2 Sats dropped on ambulation again.  Assessment & Plan:   Principal Problem:   CAP (community acquired pneumonia) Active Problems:   Hypercholesterolemia   Essential hypertension   CHF exacerbation (HCC)   Elevated troponin  Acute Hypoxic respiratory failure due to community-acquired pneumonia and acute on chronic diastolic CHF -Patient hypoxic on admission (83% on Room Air) requiring supplemental oxygen and is still hypoxic. Per PT note "2LO2 to maintain SpO2 >88%, desat to 86% on RA"; continues to require supplemental oxygen via nasal cannula -Chest x-ray showed mid and lower lung infiltrates concerning for pneumonia, there is also concern for asymmetric pulmonary edema due to elevated BNP at 700.2.   -Is received Lasix 1 mg daily for the last 3 days and will give twice daily dosing today. -Strict I's and O's and Daily Weights; Patient is -2.8 Liters but there have been no I's and O's for last 24 hours noted -Weight is down 10 pounds since admission -Due  to penicillin allergy he was started on IV Levaquin and will continue but change to po and continue for 5 days total -Repeat 2D echo done and showed "Compared to prior TTE in 2017, the LVEF appears low normal with EF 50-55%. RV systolic function is mildly depressed. Otherwise no  significant changes.";  He continues to have grade 1 diastolic dysfunction -SpO2: 98 % O2 Flow Rate (L/min): 2 L/min -Repeat CXR this AM showed "Cardiac pacer with lead tips over the right atrium right ventricle. Stable cardiomegaly. Diffuse bilateral interstitial prominence again noted slightly progressed from prior exam. Interstitial edema and/or pneumonitis could present in this fashion. No pleural effusion or pneumothorax." -Continue to Monitor Respiratory Status Carefully and Wean O2 as tolerated -COVID Testing was Negative  -Continue to Monitor for S/Sx of Volume Overload  -Because he is failing to progress significantly we will order a CT of the chest to further evaluate and will obtain a Pulmonary Consultation  Leukocytosis -Worsened from yesterday as WBC went from 9.5 -> 13.5 -Currently he is Afebrile -Will continue to Monitor Carefully and Trend -Repeat CBC within 1-2 weeks   History of Coronary Artery Disease status post PCI with DES to RCA with 50% ostial to 60% segmental mid to distal left main, and 90% thrombus filled ostial to proximal circumflex, with distal right coronary filled by collaterals from left-to-right Hx of STEMI Elevated Troponin -No Active Chest Pain; Has a PPM -High-sensitivity troponin elevated but overall flat. Troponin went from 197 -> 266 -> 294 -> 294 -He is not having any chest pain, this is less likely ACS and more likely demand ischemia -Continue cardiac monitoring, continue home medications.   -Admitting MD discussed with on-call Cardiologist who agrees that this is likely due to demand ischemia  Normocytic Anemia likely Iron Deficiency  -Hemoglobin 9.6 on Admission, baseline around 11.  Hgb/Hct is now 9.8/30.2 yesterday and today is 10.1/30.2 -He reports dark stool but he is on iron supplements.   -C/w Ferrous Sulfate 325 mg po Daily -In February 2020 he had an upper and lower endoscopic evaluation by gastroenterology without significant  findings. -Anemia Panel done and showed " iron level of 186, TIBC of 44, TIBC of 230, saturation ratios of 81%, ferritin level of 152, folate level of 12.8, and vitamin B12 of 343" -Continue to Monitor for S/Sx of Bleeding; Currently no overt bleeding noted -Repeat CBC in the AM   COPD -Stable, no wheezing, nebulizers as needed -As above -Will have Pulmonary Evaluate given above   Essential Hypertension -Last BP was 130/47 this AM  -Resume home medications with Spironolactone 25 mg po Daily  -Continue to Monitor BP's per Protocol   Hyperlipidemia -C/w Atorvastatin 20 mg po Daily   History of Seizures -Continue home medications of Phenytonin 200 mg po BID and Phenobarbital 64.8 mg po BID   Hypokalemia -Mild at 3.4 and now improved to 4.0 -Replete with po KCl 40 mEQ BID x2 yesterday  -Continue to Monitor and Replete as Necessary -Repeat CMP in the AM   GERD -C/w Pantoprazole 40 mg po Daily  DVT prophylaxis: Enoxaparin 40 mg sq q24h Code Status: FULL CODE Family Communication: No family present at bedside  Disposition Plan: Pending improvement in respiratory status and weaning of oxygen to baseline; PT OT recommending home health but he still requires supplemental oxygen via nasal cannula and was 86% on room air again   Status is: Inpatient  Remains inpatient appropriate because:Unsafe d/c plan, IV treatments appropriate due to intensity of  illness or inability to take PO and Inpatient level of care appropriate due to severity of illness   Dispo: The patient is from: Home              Anticipated d/c is to: Home              Anticipated d/c date is:1-2 days              Patient currently is not medically stable to d/c.  Consultants:   Pulmonary/PCCM  Procedures:  ECHOCARDIOGRAM 06/23/20 IMPRESSIONS    1. Left ventricular ejection fraction, by estimation, is 50 to 55%. The  left ventricle has low normal function. The left ventricle has no regional  wall motion  abnormalities. There is mild concentric left ventricular  hypertrophy. Left ventricular  diastolic parameters are consistent with Grade I diastolic dysfunction  (impaired relaxation).  2. Right ventricular systolic function is mildly reduced. The right  ventricular size is normal. There is moderately elevated pulmonary artery  systolic pressure.  3. Right atrial size was mildly dilated.  4. The mitral valve is normal in structure. Mild mitral valve  regurgitation.  5. The aortic valve is tricuspid. There is mild calcification of the  aortic valve. There is mild thickening of the aortic valve. Aortic valve  regurgitation is not visualized.  6. Aortic dilatation noted. There is mild dilatation of the aortic root,  measuring 37 mm.  7. The inferior vena cava is dilated in size with >50% respiratory  variability, suggesting right atrial pressure of 8 mmHg.   Comparison(s): Compared to prior TTE in 2017, the LVEF appears low normal  with EF 50-55%. RV systolic function is mildly depressed. Otherwise no  significant changes.   FINDINGS  Left Ventricle: Left ventricular ejection fraction, by estimation, is 50  to 55%. The left ventricle has low normal function. The left ventricle has  no regional wall motion abnormalities. The left ventricular internal  cavity size was normal in size.  There is mild concentric left ventricular hypertrophy. Abnormal  (paradoxical) septal motion, consistent with RV pacemaker. Left  ventricular diastolic parameters are consistent with Grade I diastolic  dysfunction (impaired relaxation).   Right Ventricle: The right ventricular size is normal. Right vetricular  wall thickness was not assessed. Right ventricular systolic function is  mildly reduced. There is moderately elevated pulmonary artery systolic  pressure. The tricuspid regurgitant  velocity is 3.54 m/s, and with an assumed right atrial pressure of 3 mmHg,  the estimated right ventricular  systolic pressure is 83.3 mmHg.   Left Atrium: Left atrial size was normal in size.   Right Atrium: Right atrial size was mildly dilated.   Pericardium: There is no evidence of pericardial effusion.   Mitral Valve: The mitral valve is normal in structure. There is mild  thickening of the mitral valve leaflet(s). Mild to moderate mitral annular  calcification. Mild mitral valve regurgitation.   Tricuspid Valve: The tricuspid valve is normal in structure. Tricuspid  valve regurgitation is mild.   Aortic Valve: The aortic valve is tricuspid. There is mild calcification  of the aortic valve. There is mild thickening of the aortic valve. Aortic  valve regurgitation is not visualized.   Pulmonic Valve: The pulmonic valve was grossly normal. Pulmonic valve  regurgitation is trivial.   Aorta: Aortic dilatation noted. There is mild dilatation of the aortic  root, measuring 37 mm.   Venous: The inferior vena cava is dilated in size with greater than 50%  respiratory  variability, suggesting right atrial pressure of 8 mmHg.   IAS/Shunts: No atrial level shunt detected by color flow Doppler.   Additional Comments: A pacer wire is visualized.     LEFT VENTRICLE  PLAX 2D  LVIDd:     4.78 cm  LVIDs:     3.54 cm  LV PW:     1.07 cm  LV IVS:    1.06 cm  LVOT diam:   2.00 cm  LV SV:     57  LV SV Index:  27  LVOT Area:   3.14 cm    LV Volumes (MOD)  LV vol s, MOD A2C: 45.8 ml  LV vol s, MOD A4C: 39.5 ml   RIGHT VENTRICLE  RV S prime:   10.50 cm/s  TAPSE (M-mode): 1.2 cm   LEFT ATRIUM       Index    RIGHT ATRIUM      Index  LA diam:    3.70 cm 1.78 cm/m RA Area:   19.90 cm  LA Vol (A2C):  52.8 ml 25.42 ml/m RA Volume:  58.40 ml 28.12 ml/m  LA Vol (A4C):  44.3 ml 21.33 ml/m  LA Biplane Vol: 49.1 ml 23.64 ml/m  AORTIC VALVE  LVOT Vmax:  96.00 cm/s  LVOT Vmean: 65.250 cm/s  LVOT VTI:  0.181 m    AORTA  Ao Root  diam: 3.70 cm  Ao Asc diam: 3.20 cm   TRICUSPID VALVE  TR Peak grad:  50.1 mmHg  TR Vmax:    354.00 cm/s    SHUNTS  Systemic VTI: 0.18 m  Systemic Diam: 2.00 cm   Antimicrobials: Anti-infectives (From admission, onward)   Start     Dose/Rate Route Frequency Ordered Stop   06/24/20 1430  levofloxacin (LEVAQUIN) tablet 750 mg        750 mg Oral Daily 06/24/20 1349 06/27/20 0959   06/23/20 1800  levofloxacin (LEVAQUIN) IVPB 750 mg  Status:  Discontinued        750 mg 100 mL/hr over 90 Minutes Intravenous Every 24 hours 06/22/20 2013 06/24/20 1348   06/22/20 1815  levofloxacin (LEVAQUIN) IVPB 750 mg        750 mg 100 mL/hr over 90 Minutes Intravenous  Once 06/22/20 1807 06/22/20 2041        Subjective: Seen and examined at bedside he still feels dyspneic without oxygen.  No nausea or vomiting.  Denies any lightheadedness or dizziness.  No chest pain and states that he only ambulated to the restroom.  Was up in a chair about to eat.  Denies any other concerns or complaints this time.  Objective: Vitals:   06/24/20 2316 06/25/20 0328 06/25/20 0716 06/25/20 1230  BP: (!) 134/40 129/86 (!) 133/48 (!) 130/47  Pulse: 63 64 62 (!) 59  Resp: 20 19 18  (!) 21  Temp: 99.1 F (37.3 C) 98.8 F (37.1 C) 98.3 F (36.8 C) 98.3 F (36.8 C)  TempSrc: Oral Oral Oral Oral  SpO2: 96% 97% 95% 98%  Weight:  79.3 kg    Height:       No intake or output data in the 24 hours ending 06/25/20 1405 Filed Weights   06/23/20 1025 06/24/20 0622 06/25/20 0328  Weight: 83.5 kg 80.7 kg 79.3 kg   Examination: Physical Exam:  Constitutional: WN/WD elderly Caucasian male currently in NAD and appears calm and comfortable Eyes: Lids and conjunctive are normal.  Sclerae anicteric ENMT: External Ears, Nose appear normal. Grossly normal hearing. Neck:  Appears normal, supple, no cervical masses, normal [name], no appreciable thyromegaly: No JVD Respiratory: Diminished to auscultation bilaterally with  coarse breath sounds and some rhonchi and crackles but no appreciable wheezing.  Has a normal respiratory effort and is not tachypneic but is wearing supplemental oxygen via nasal cannula. Cardiovascular: RRR, no murmurs / rubs / gallops. S1 and S2 auscultated.  Trace extremity edema Abdomen: Soft, non-tender, mildly distended. Bowel sounds positive.  GU: Deferred. Musculoskeletal: No clubbing / cyanosis of digits/nails. No joint deformity upper and lower extremities. Skin: No rashes, lesions, ulcers on limited skin evaluation. No induration; Warm and dry.  Neurologic: CN 2-12 grossly intact with no focal deficits. Romberg sign and Cerebellar reflexes were not assessed Psychiatric: Normal judgment and insight. Alert and oriented x 3. Normal mood and appropriate affect.   Data Reviewed: I have personally reviewed following labs and imaging studies  CBC: Recent Labs  Lab 06/22/20 1523 06/24/20 0234 06/25/20 0333  WBC 8.4 9.5 13.5*  NEUTROABS 6.0  --  9.1*  HGB 9.6* 9.8* 10.1*  HCT 29.7* 30.2* 30.2*  MCV 94.6 95.0 95.0  PLT 296 318 861   Basic Metabolic Panel: Recent Labs  Lab 06/22/20 1523 06/24/20 0234 06/25/20 0333  NA 136 137 138  K 4.3 3.4* 4.0  CL 103 100 105  CO2 23 27 24   GLUCOSE 101* 85 100*  BUN 21 26* 25*  CREATININE 0.91 1.13 1.04  CALCIUM 8.9 8.6* 8.4*  MG  --   --  1.8  PHOS  --   --  2.9   GFR: Estimated Creatinine Clearance: 57.2 mL/min (by C-G formula based on SCr of 1.04 mg/dL). Liver Function Tests: Recent Labs  Lab 06/22/20 1523 06/24/20 0234 06/25/20 0333  AST 41 30 27  ALT 37 29 24  ALKPHOS 136* 123 122  BILITOT 0.8 0.8 0.8  PROT 6.0* 5.7* 5.7*  ALBUMIN 3.4* 3.1* 3.0*   No results for input(s): LIPASE, AMYLASE in the last 168 hours. No results for input(s): AMMONIA in the last 168 hours. Coagulation Profile: No results for input(s): INR, PROTIME in the last 168 hours. Cardiac Enzymes: No results for input(s): CKTOTAL, CKMB, CKMBINDEX,  TROPONINI in the last 168 hours. BNP (last 3 results) Recent Labs    08/21/19 1638  PROBNP 424   HbA1C: No results for input(s): HGBA1C in the last 72 hours. CBG: Recent Labs  Lab 06/24/20 2103  GLUCAP 102*   Lipid Profile: No results for input(s): CHOL, HDL, LDLCALC, TRIG, CHOLHDL, LDLDIRECT in the last 72 hours. Thyroid Function Tests: No results for input(s): TSH, T4TOTAL, FREET4, T3FREE, THYROIDAB in the last 72 hours. Anemia Panel: Recent Labs    06/23/20 0347  VITAMINB12 343  FOLATE 12.8  FERRITIN 152  TIBC 230*  IRON 186*  RETICCTPCT 2.7   Sepsis Labs: Recent Labs  Lab 06/23/20 0347  PROCALCITON <0.10    Recent Results (from the past 240 hour(s))  Respiratory Panel by RT PCR (Flu A&B, Covid) - Nasopharyngeal Swab     Status: None   Collection Time: 06/22/20  3:57 PM   Specimen: Nasopharyngeal Swab  Result Value Ref Range Status   SARS Coronavirus 2 by RT PCR NEGATIVE NEGATIVE Final    Comment: (NOTE) SARS-CoV-2 target nucleic acids are NOT DETECTED.  The SARS-CoV-2 RNA is generally detectable in upper respiratoy specimens during the acute phase of infection. The lowest concentration of SARS-CoV-2 viral copies this assay can detect is 131 copies/mL. A negative result does not preclude  SARS-Cov-2 infection and should not be used as the sole basis for treatment or other patient management decisions. A negative result may occur with  improper specimen collection/handling, submission of specimen other than nasopharyngeal swab, presence of viral mutation(s) within the areas targeted by this assay, and inadequate number of viral copies (<131 copies/mL). A negative result must be combined with clinical observations, patient history, and epidemiological information. The expected result is Negative.  Fact Sheet for Patients:  PinkCheek.be  Fact Sheet for Healthcare Providers:  GravelBags.it  This  test is no t yet approved or cleared by the [place] FDA and  has been authorized for detection and/or diagnosis of SARS-CoV-2 by FDA under an Emergency Use Authorization (EUA). This EUA will remain  in effect (meaning this test can be used) for the duration of the COVID-19 declaration under Section 564(b)(1) of the Act, 21 U.S.C. section 360bbb-3(b)(1), unless the authorization is terminated or revoked sooner.     Influenza A by PCR NEGATIVE NEGATIVE Final   Influenza B by PCR NEGATIVE NEGATIVE Final    Comment: (NOTE) The Xpert Xpress SARS-CoV-2/FLU/RSV assay is intended as an aid in  the diagnosis of influenza from Nasopharyngeal swab specimens and  should not be used as a sole basis for treatment. Nasal washings and  aspirates are unacceptable for Xpert Xpress SARS-CoV-2/FLU/RSV  testing.  Fact Sheet for Patients: PinkCheek.be  Fact Sheet for Healthcare Providers: GravelBags.it  This test is not yet approved or cleared by the [place] FDA and  has been authorized for detection and/or diagnosis of SARS-CoV-2 by  FDA under an Emergency Use Authorization (EUA). This EUA will remain  in effect (meaning this test can be used) for the duration of the  Covid-19 declaration under Section 564(b)(1) of the Act, 21  U.S.C. section 360bbb-3(b)(1), unless the authorization is  terminated or revoked. Performed at [facility], [place] [address], [place], [place] [address]      RN Pressure Injury Documentation:     Estimated body mass index is 23.07 kg/m as calculated from the following:   Height as of this encounter: 6\' 1"  (1.854 m).   Weight as of this encounter: 79.3 kg.  Malnutrition Type:      Malnutrition Characteristics:    Nutrition Interventions:        Radiology Studies: DG CHEST PORT 1 VIEW  Result Date: 06/25/2020 CLINICAL DATA:  Shortness of breath. EXAM: PORTABLE CHEST 1 VIEW  COMPARISON:  06/24/2020.  06/22/2020.  11/25/2019. FINDINGS: Cardiac pacer with lead tips over the right atrium right ventricle. Stable cardiomegaly. Diffuse bilateral interstitial prominence again noted slightly progressed from prior exam. Interstitial edema and/or pneumonitis could present in this fashion. No pleural effusion or pneumothorax. IMPRESSION: 1. Cardiac pacer with lead tips over the right atrium and right ventricle. Stable cardiomegaly. 2. Diffuse bilateral interstitial prominence again noted slightly progressed from prior exam. Interstitial edema and/or pneumonitis could present in this fashion. Electronically Signed   By: Marcello Moores  Register   On: 06/25/2020 08:12   DG CHEST PORT 1 VIEW  Result Date: 06/24/2020 CLINICAL DATA:  Shortness of breath EXAM: PORTABLE CHEST 1 VIEW COMPARISON:  06/22/2020 FINDINGS: Mild subpleural patchy opacities in the right mid lung, lingula, and bilateral lower lobes, favoring multifocal pneumonia, grossly unchanged. Atypical/viral infection is possible. No pleural effusion or pneumothorax. Cardiomegaly.  Right subclavian pacemaker. IMPRESSION: Suspected multifocal pneumonia, grossly unchanged. Atypical/viral infection is possible. Electronically Signed   By: Julian Hy M.D.   On: 06/24/2020 10:26  ECHOCARDIOGRAM COMPLETE  Result Date: 06/25/2020    ECHOCARDIOGRAM REPORT   Patient Name:   Joshua [name] Date of Exam: 06/23/2020 Medical Rec #:  [id number]      Height:       73.0 in Accession #:    [id number]     Weight:       184.0 lb Date of Birth:  Jun 17, 1932      BSA:          2.077 m Patient Age:    67 years       BP:           146/72 mmHg Patient Gender: M              HR:           66 bpm. Exam Location:  Inpatient Procedure: 2D Echo, Cardiac Doppler and Color Doppler Indications:    CHF-Acute Diastolic 546.27 / O35.00  History:        Patient has prior history of Echocardiogram examinations, most                 recent 06/02/2016. Previous Myocardial  Infarction and CAD, COPD,                 Signs/Symptoms:Dyspnea; Risk Factors:Hypertension and                 Dyslipidemia.  Sonographer:    Bernadene Person RDCS Referring Phys: [id number] [place]  1. Left ventricular ejection fraction, by estimation, is 50 to 55%. The left ventricle has low normal function. The left ventricle has no regional wall motion abnormalities. There is mild concentric left ventricular hypertrophy. Left ventricular diastolic parameters are consistent with Grade I diastolic dysfunction (impaired relaxation).  2. Right ventricular systolic function is mildly reduced. The right ventricular size is normal. There is moderately elevated pulmonary artery systolic pressure.  3. Right atrial size was mildly dilated.  4. The mitral valve is normal in structure. Mild mitral valve regurgitation.  5. The aortic valve is tricuspid. There is mild calcification of the aortic valve. There is mild thickening of the aortic valve. Aortic valve regurgitation is not visualized.  6. Aortic dilatation noted. There is mild dilatation of the aortic root, measuring 37 mm.  7. The inferior vena cava is dilated in size with >50% respiratory variability, suggesting right atrial pressure of 8 mmHg. Comparison(s): Compared to prior TTE in 2017, the LVEF appears low normal with EF 50-55%. RV systolic function is mildly depressed. Otherwise no significant changes. FINDINGS  Left Ventricle: Left ventricular ejection fraction, by estimation, is 50 to 55%. The left ventricle has low normal function. The left ventricle has no regional wall motion abnormalities. The left ventricular internal cavity size was normal in size. There is mild concentric left ventricular hypertrophy. Abnormal (paradoxical) septal motion, consistent with RV pacemaker. Left ventricular diastolic parameters are consistent with Grade I diastolic dysfunction (impaired relaxation). Right Ventricle: The right ventricular size is normal.  Right vetricular wall thickness was not assessed. Right ventricular systolic function is mildly reduced. There is moderately elevated pulmonary artery systolic pressure. The tricuspid regurgitant velocity is 3.54 m/s, and with an assumed right atrial pressure of 3 mmHg, the estimated right ventricular systolic pressure is 93.7 mmHg. Left Atrium: Left atrial size was normal in size. Right Atrium: Right atrial size was mildly dilated. Pericardium: There is no evidence of pericardial effusion. Mitral Valve: The mitral valve is normal in structure. There is mild thickening of  the mitral valve leaflet(s). Mild to moderate mitral annular calcification. Mild mitral valve regurgitation. Tricuspid Valve: The tricuspid valve is normal in structure. Tricuspid valve regurgitation is mild. Aortic Valve: The aortic valve is tricuspid. There is mild calcification of the aortic valve. There is mild thickening of the aortic valve. Aortic valve regurgitation is not visualized. Pulmonic Valve: The pulmonic valve was grossly normal. Pulmonic valve regurgitation is trivial. Aorta: Aortic dilatation noted. There is mild dilatation of the aortic root, measuring 37 mm. Venous: The inferior vena cava is dilated in size with greater than 50% respiratory variability, suggesting right atrial pressure of 8 mmHg. IAS/Shunts: No atrial level shunt detected by color flow Doppler. Additional Comments: A pacer wire is visualized.  LEFT VENTRICLE PLAX 2D LVIDd:         4.78 cm LVIDs:         3.54 cm LV PW:         1.07 cm LV IVS:        1.06 cm LVOT diam:     2.00 cm LV SV:         57 LV SV Index:   27 LVOT Area:     3.14 cm  LV Volumes (MOD) LV vol s, MOD A2C: 45.8 ml LV vol s, MOD A4C: 39.5 ml RIGHT VENTRICLE RV S prime:     10.50 cm/s TAPSE (M-mode): 1.2 cm LEFT ATRIUM             Index       RIGHT ATRIUM           Index LA diam:        3.70 cm 1.78 cm/m  RA Area:     19.90 cm LA Vol (A2C):   52.8 ml 25.42 ml/m RA Volume:   58.40 ml  28.12  ml/m LA Vol (A4C):   44.3 ml 21.33 ml/m LA Biplane Vol: 49.1 ml 23.64 ml/m  AORTIC VALVE LVOT Vmax:   96.00 cm/s LVOT Vmean:  65.250 cm/s LVOT VTI:    0.181 m  AORTA Ao Root diam: 3.70 cm Ao Asc diam:  3.20 cm TRICUSPID VALVE TR Peak grad:   50.1 mmHg TR Vmax:        354.00 cm/s  SHUNTS Systemic VTI:  0.18 m Systemic Diam: 2.00 cm Gwyndolyn Kaufman MD Electronically signed by Gwyndolyn Kaufman MD Signature Date/Time: 06/25/2020/12:03:09 PM    Final    Scheduled Meds: . aspirin EC  81 mg Oral Daily  . atorvastatin  20 mg Oral Daily  . citalopram  20 mg Oral Daily  . clopidogrel  75 mg Oral Daily  . enoxaparin (LOVENOX) injection  40 mg Subcutaneous Q24H  . famotidine  20 mg Oral Daily  . ferrous sulfate  325 mg Oral Q breakfast  . guaiFENesin  1,200 mg Oral BID  . levofloxacin  750 mg Oral Daily  . pantoprazole  40 mg Oral Daily  . PHENobarbital  64.8 mg Oral BID  . phenytoin  200 mg Oral BID  . spironolactone  25 mg Oral Daily   Continuous Infusions: . magnesium sulfate bolus IVPB      LOS: 3 days   Kerney Elbe, DO [facility] PAGER is on [place]  If 7PM-7AM, please contact night-coverage [email or web address]

## 2020-06-25 NOTE — Progress Notes (Signed)
Occupational Therapy Evaluation Patient Details Name: Joshua [name] MRN: [id number] DOB: [birth year] Today's Date: 06/25/2020    History of Present Illness Pt is an 86 yr old male admitted for CAP with a history of COPD, CHF, CAD status post PCI, hypertension, seizures (managed with medication), AV block requiring PPM, GERD, and history of prostate and bladder cancer. Pt reports stool is always dark due to iron supplement for anemia. Echo pending.    Clinical Impression   PTA pt lived home alone with regular assistance for IADLs (groceries and cleaning) and pt drove to work regularly. Pt admitted for the reasons above and is limited by poor balance with a history of near daily falls and decreased activity tolerance. Pt will benefit from skilled OT to address these limitations. Pt reports no use of O2 at home PTA. Pt required min guard to min assist and RW during sit<>stand transfers due to poor eccentric control when lowering to sit. Pt required S to min guard for ambulation and when completing standing grooming and hygiene tasks at sink. Pt O2 sats ranged from 88-98% during ADL tasks on 2LO2 via [place]. Pt O2 sats dropped to 88% while doff/donning socks while seated EOB and when standing at sink completing grooming tasks. Recommending HHOT and 24/7 supervision initially due to decreased balance and increased fall risk, at minimum recommend assist when OOB and during ADLs. Will continue to follow.     Follow Up Recommendations  Home health OT;Supervision/Assistance - 24 hour (intital 24/7)    Equipment Recommendations  None recommended by OT    Recommendations for Other Services       Precautions / Restrictions Precautions Precautions: Fall Restrictions Weight Bearing Restrictions: No      Mobility Bed Mobility Overal bed mobility: Needs Assistance Bed Mobility: Supine to Sit     Supine to sit: Supervision     General bed mobility comments: for safety due to overall decreased  balance  Transfers Overall transfer level: Needs assistance Equipment used: Rolling walker (2 wheeled) Transfers: Sit to/from Stand Sit to Stand: Min assist         General transfer comment: min assist for safety during sit<>stand and requires min verbal cueing to lower to seat with control    Balance Overall balance assessment: Needs assistance;History of Falls Sitting-balance support: No upper extremity supported;Feet supported Sitting balance-Leahy Scale: Fair Sitting balance - Comments: pt required min verbal cueing to lean forward while seated EOB due to slight posterior lean  Postural control: Posterior lean (when seated EOB) Standing balance support: Bilateral upper extremity supported;During functional activity Standing balance-Leahy Scale: Poor Standing balance comment: used RW for standing balance and had at least one hand on RW during functional ADL tasks while standing at sink                           ADL either performed or assessed with clinical judgement   ADL Overall ADL's : Needs assistance/impaired     Grooming: Oral care;Supervision/safety;Min guard;Standing       Lower Body Bathing: Sit to/from stand;Minimal assistance;Supervison/ safety Lower Body Bathing Details (indicate cue type and reason): peri care while standing at sink with S to min guard assist and RW for safety     Lower Body Dressing: Sitting/lateral leans;Sit to/from stand;Minimal assistance Lower Body Dressing Details (indicate cue type and reason): doff/donn socks while seated EOB with supervision for safety and min verbal cueing to lean forward with sitting balance Toilet  Transfer: RW;Min Government social research officer Details (indicate cue type and reason): simulated in room. required min assist when lowering to chair         Functional mobility during ADLs: Min guard;Rolling walker General ADL Comments: pt requires S to min guard for safety due to decreased  balance during dynamic tasks and min assist during sit<>stand transfers due to tendency to plop when lowering to chair       Vision Baseline Vision/History: Wears glasses Wears Glasses: Reading only       Perception     Praxis      Pertinent Vitals/Pain Pain Assessment: No/denies pain     Hand Dominance Right   Extremity/Trunk Assessment Upper Extremity Assessment Upper Extremity Assessment: Overall WFL for tasks assessed   Lower Extremity Assessment Lower Extremity Assessment: Defer to PT evaluation   Cervical / Trunk Assessment Cervical / Trunk Assessment: Kyphotic   Communication Communication Communication: No difficulties   Cognition Arousal/Alertness: Awake/alert Behavior During Therapy: WFL for tasks assessed/performed Overall Cognitive Status: Within Functional Limits for tasks assessed                                 General Comments: Lack of insight into deficits, poor safety awareness   General Comments  required 2LO2 to maintain sats with range of 88% to 98% during ADLs in room while standing at sink and when seated EOB when donn/doff socks    Exercises     Shoulder Instructions      Home Living Family/patient expects to be discharged to:: Private residence Living Arrangements: Alone Available Help at Discharge: Family;Available PRN/intermittently Type of Home: House Home Access: Stairs to enter Entrance Stairs-Number of Steps: 1 Entrance Stairs-Rails: Left Home Layout: Two level;Bed/bath upstairs;Able to live on main level with bedroom/bathroom Alternate Level Stairs-Number of Steps: 9 Alternate Level Stairs-Rails: Can reach both Bathroom Shower/Tub: Teacher, early years/pre: Standard     Home Equipment: Grab bars - tub/shower;Walker - 2 wheels;Other (comment) (has shower seat but doesn't use)   Additional Comments: pt reports using walking stick and furniture walking when needed      Prior Functioning/Environment  Level of Independence: Needs assistance  Gait / Transfers Assistance Needed: Ambulatory with walking stick, pt endorses nearly daily falls ADL's / Homemaking Assistance Needed: Pt has [facility]HA 2 hours a day M-F to assist with household tasks like meal prep, grocery shopping. Also has cleaning lady once a week.   Comments: pt reports driving 7 miles to work reguarly        OT Problem List: Impaired balance (sitting and/or standing);Decreased safety awareness;Decreased activity tolerance      OT Treatment/Interventions: Self-care/ADL training;Therapeutic exercise;Energy conservation;Therapeutic activities;Patient/family education;Balance training    OT Goals(Current goals can be found in the care plan section) Acute Rehab OT Goals Patient Stated Goal: go home OT Goal Formulation: With patient Time For Goal Achievement: 07/09/20 Potential to Achieve Goals: Good ADL Goals Pt Will Perform Grooming: with supervision;standing Pt Will Perform Lower Body Bathing: with supervision;sit to/from stand Pt Will Transfer to Toilet: with supervision;ambulating Pt Will Perform Toileting - Clothing Manipulation and hygiene: with supervision;sit to/from stand  OT Frequency: Min 2X/week   Barriers to D/C:            Co-evaluation              AM-PAC OT "6 Clicks" Daily Activity     Outcome Measure Help from  another person eating meals?: None Help from another person taking care of personal grooming?: A Little Help from another person toileting, which includes using toliet, bedpan, or urinal?: A Little Help from another person bathing (including washing, rinsing, drying)?: A Little Help from another person to put on and taking off regular upper body clothing?: A Little Help from another person to put on and taking off regular lower body clothing?: A Little 6 Click Score: 19   End of Session Equipment Utilized During Treatment: Gait belt;Rolling walker Nurse Communication: Mobility  status  Activity Tolerance: Patient tolerated treatment well Patient left: in chair;with call bell/phone within reach  OT Visit Diagnosis: History of falling (Z91.81);Unsteadiness on feet (R26.81);Repeated falls (R29.6);Muscle weakness (generalized) (M62.81)                Time: 0817-0900 OT Time Calculation (min): 43 min Charges:  OT General Charges $OT Visit: 1 Visit OT Evaluation $OT Eval Moderate Complexity: 1 Mod OT Treatments $Self Care/Home Management : 23-37 mins  Evert Kohl, MOTR/L Acute Rehabilitation Services - Relief OT Office: [phone]   Ronda Fairly 06/25/2020, 11:04 AM

## 2020-06-25 NOTE — TOC Initial Note (Signed)
Transition of Care [facility]) - Initial/Assessment Note    Patient Details  Name: Joshua [name] MRN: [id number] Date of Birth: 03-16-1932  Transition of [facility] CM/SW Contact:    Joanne Chars, LCSW Phone Number: 06/25/2020, 9:56 AM  Clinical Narrative:     CSW met with pt to discuss DC plan.  Pt lives alone, has two people who come in to help: daily aide who does cooking and weekly cleaning person.  Pt son lives in [place] but still comes to [place] daily for work and pt reports his son has "office in my building."  Pt agreeable to [facility] recommendation.  Pt has had HH once before and had bad experience with agency from [place]--this may have been [place] aide?  Permission given to speak with son and to send out info for [facility].  Pt currently has rolling walker and shower bench at home, has not been using walker.  Pt is vaccinated for covid.  Confirmed address, phone, and PCP.               Expected Discharge Plan: [place] Barriers to Discharge: Continued Medical Work up   Patient Goals and CMS Choice Patient states their goals for this hospitalization and ongoing recovery are:: "be mobile, keep driving" CMS Medicare.gov Compare Post Acute Care list provided to:: Patient Choice offered to / list presented to : Patient  Expected Discharge Plan and Services Expected Discharge Plan: [place] Choice: [place] arrangements for the past 2 months: Single Family Home                                      Prior Living Arrangements/Services Living arrangements for the past 2 months: Single Family Home Lives with:: Self Patient language and need for interpreter reviewed:: Yes Do you feel safe going back to the place where you live?: Yes      Need for Family Participation in Patient Care: Yes (Comment) Care giver support system in place?: Yes (comment) (son) Current home services: Homehealth aide (Pt has person in  the home daily as well as cleaning person weekly) Criminal Activity/Legal Involvement Pertinent to Current Situation/Hospitalization: No - Comment as needed  Activities of Daily Living Home Assistive Devices/Equipment: Cane (specify quad or straight) ADL Screening (condition at time of admission) Patient's cognitive ability adequate to safely complete daily activities?: Yes Is the patient deaf or have difficulty hearing?: No Does the patient have difficulty seeing, even when wearing glasses/contacts?: No Does the patient have difficulty concentrating, remembering, or making decisions?: No Patient able to express need for assistance with ADLs?: Yes Does the patient have difficulty dressing or bathing?: No Independently performs ADLs?: Yes (appropriate for developmental age) Does the patient have difficulty walking or climbing stairs?: Yes Weakness of Legs: Both Weakness of Arms/Hands: Both  Permission Sought/Granted Permission sought to share information with : Facility Sport and exercise psychologist, Family Supports Permission granted to share information with : Yes, Verbal Permission Granted  Share Information with NAME: son Joshua [name]  Permission granted to share info w AGENCY: HH        Emotional Assessment Appearance:: Appears stated age Attitude/Demeanor/Rapport: Engaged Affect (typically observed): Pleasant Orientation: : Oriented to Self, Oriented to Place, Oriented to  Time, Oriented to Situation Alcohol / Substance Use: Not Applicable Psych Involvement: No (comment)  Admission diagnosis:  CAP (  community acquired pneumonia) [J18.9] NSTEMI (non-ST elevated myocardial infarction) ([place]) [I21.4] Acute hypoxemic respiratory failure ([place]) [J96.01] Community acquired pneumonia, unspecified laterality [J18.9] Patient Active Problem List   Diagnosis Date Noted  . CAP (community acquired pneumonia) 06/22/2020  . CHF exacerbation ([place]) 06/22/2020  . Elevated troponin 06/22/2020  . Chronic  diastolic heart failure ([place]) 11/25/2019  . High risk medication use 07/19/2019  . Pseudophakia, both eyes 09/20/2018  . Exudative age-related macular degeneration ([place]) 09/20/2018  . Second degree AV block 06/15/2017  . Aortic atherosclerosis ([place]) 06/03/2016  . General weakness   . Old MI (myocardial infarction) 04/27/2016  . Bladder cancer ([place]) 04/18/2016  . Vitamin D deficiency 10/15/2015  . Prostate cancer ([place])   . Hypercholesterolemia   . Essential hypertension   . Seizures ([place])   . GERD 08/22/2008  . COLONIC POLYPS, ADENOMATOUS, HX OF 08/22/2008   PCP:  Chevis Pretty, FNP Pharmacy:   [facility], [place] [address] [place] [address] Phone: [phone] Fax: [phone]  [place], [place] [address] Phone: [phone] Fax: [phone]  [facility] [place], [place] [address] [place] [address] Phone: [phone] Fax: [phone]  [id number] [id number]: [phone]: [phone]    Social Determinants of Health (SDOH) Interventions    Readmission Risk Interventions No flowsheet data found.

## 2020-06-25 NOTE — Progress Notes (Signed)
SATURATION QUALIFICATIONS: Patient Saturations on Room Air at Rest = 96%  Patient Saturations on Room Air while Ambulating = 87%  Patient Saturations on 2 Liters of oxygen while Ambulating = 100%  Pt need home oxygen because of desaturation.

## 2020-06-25 NOTE — Consult Note (Signed)
NAME:  Joshua [name], MRN:  [id number], DOB:  [birth year], LOS: 3 ADMISSION DATE:  06/22/2020, CONSULTATION DATE:  06/25/20 REFERRING MD:  Alfredia Ferguson, CHIEF COMPLAINT:  DOE   Brief History   86 year old man presenting with progressive dyspnea and abnormal CXR.  History of present illness   86 year old man with distant hx of smoking, emphysema presenting with 2 weeks of worsening SOB and DOE.  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)  Never hospitalized for breathing before  On no home inhalers, on no home O2  Denies LE edema  Denies aspiration symptoms although has pharyngeal dysphagia based on MBSS in 2017  No history of autoimmune disorders  Has osteoarthritis  Occasional dry mouth and dry eyes  Distant hx of seasonal allergies which have resolved  Hx of seizure disorders on dilantin and phenobarbital  Past Medical History  Seizures Intracranial AVM s/p craniotomy in 1980 CAD post PCI SSS s/p PPM Prostate cancer post radiation seeds 2013 Bladder cancer post local resection, low grade 2017 HTN HLD  Significant Hospital Events   N/A  Consults:  N/A  Procedures:  N/A  Significant Diagnostic Tests:  CXR: c/w pulmonary edema on top of emphysematous lungs vs. Diffuse ILD  Micro Data:  COVID neg Pct neg  Antimicrobials:  Levaquin 10/4>>   Interim history/subjective:  Consulted  Objective   Blood pressure (!) 130/47, pulse (!) 59, temperature 98.3 F (36.8 C), temperature source Oral, resp. rate (!) 21, height 6\' 1"  (1.854 m), weight 79.3 kg, SpO2 98 %.       No intake or output data in the 24 hours ending 06/25/20 1513 Filed Weights    06/23/20 1025 06/24/20 0622 06/25/20 0328  Weight: 83.5 kg 80.7 kg 79.3 kg    Examination: Constitutional: elderly man in no acute distress Eyes: eyes are anicteric, reactive to light Ears, nose, mouth, and throat: mucous membranes dry, trachea midline Cardiovascular: heart sounds are regular, ext are warm to touch. trace edema Respiratory: Crackles bilaterally, no accessory muscle use, saturating high 90s on 2L [place] Gastrointestinal: abdomen is soft with + BS Skin: No rashes, normal turgor, chronic age related changes MSK: advanced OA changed Neurologic: Moves all 4 ext to command, good strength Psychiatric: RASS 0, good insight  Mild normocytic anemia Chronic eosinophilia Mild leukocytosis today Echo G1 diastolic dysfunction Mildly elevated BNP Chemistry benign  Resolved Hospital Problem list     Assessment & Plan:  Relatively acute onset hypoxemia Abnormal CXR Underlying emphysema with distant hx of smoking, no PFTs on file Hx of CAD, PPM  No infectious symptoms, pct neg.  No fluid overload symptoms.  No constitutional symptoms.  Has longstanding eosinophilia.  Only thing jumping out on med list are AEDs which can be associated with inflammatory pneumonitis but he has been on these for quite a while.  Hx of pharyngeal dysphagia back in 2017 which was attributed to dry mouth. Probably worth another look given frequency of occult aspiration in this population.  - DC abx - Check autoimmune labs tomorrow AM - f/u CT chest, will consider trial of steroids in AM for COP variant - SLP re-evaluation - worst case  could consider bronch but I would favor trial of steroids and if marked improvement can just call it COP   Labs   CBC: Recent Labs  Lab 06/22/20 1523 06/24/20 0234 06/25/20 0333  WBC 8.4 9.5 13.5*  NEUTROABS 6.0  --  9.1*  HGB 9.6* 9.8* 10.1*  HCT 29.7* 30.2* 30.2*  MCV 94.6 95.0 95.0  PLT 296 318 222    Basic Metabolic Panel: Recent Labs  Lab 06/22/20 1523  06/24/20 0234 06/25/20 0333  NA 136 137 138  K 4.3 3.4* 4.0  CL 103 100 105  CO2 23 27 24   GLUCOSE 101* 85 100*  BUN 21 26* 25*  CREATININE 0.91 1.13 1.04  CALCIUM 8.9 8.6* 8.4*  MG  --   --  1.8  PHOS  --   --  2.9   GFR: Estimated Creatinine Clearance: 57.2 mL/min (by C-G formula based on SCr of 1.04 mg/dL). Recent Labs  Lab 06/22/20 1523 06/23/20 0347 06/24/20 0234 06/25/20 0333  PROCALCITON  --  <0.10  --   --   WBC 8.4  --  9.5 13.5*    Liver Function Tests: Recent Labs  Lab 06/22/20 1523 06/24/20 0234 06/25/20 0333  AST 41 30 27  ALT 37 29 24  ALKPHOS 136* 123 122  BILITOT 0.8 0.8 0.8  PROT 6.0* 5.7* 5.7*  ALBUMIN 3.4* 3.1* 3.0*   No results for input(s): LIPASE, AMYLASE in the last 168 hours. No results for input(s): AMMONIA in the last 168 hours.  ABG    Component Value Date/Time   TCO2 24 11/10/2016 0746     Coagulation Profile: No results for input(s): INR, PROTIME in the last 168 hours.  Cardiac Enzymes: No results for input(s): CKTOTAL, CKMB, CKMBINDEX, TROPONINI in the last 168 hours.  HbA1C: Hgb A1c MFr Bld  Date/Time Value Ref Range Status  06/02/2016 04:52 AM 5.0 4.8 - 5.6 % Final    Comment:    (NOTE)         Pre-diabetes: 5.7 - 6.4         Diabetes: >6.4         Glycemic control for adults with diabetes: <7.0   04/27/2016 11:33 AM 5.5 4.8 - 5.6 % Final    Comment:    (NOTE)         Pre-diabetes: 5.7 - 6.4         Diabetes: >6.4         Glycemic control for adults with diabetes: <7.0     CBG: Recent Labs  Lab 06/24/20 2103  GLUCAP 102*    Review of Systems:    Positive Symptoms in bold:  Constitutional fevers, chills, weight loss, fatigue, anorexia, malaise  Eyes decreased vision, double vision, eye irritation  Ears, Nose, Mouth, Throat sore throat, trouble swallowing, sinus congestion  Cardiovascular chest pain, paroxysmal nocturnal dyspnea, lower ext edema, palpitations   Respiratory SOB, cough, DOE,  hemoptysis, wheezing  Gastrointestinal nausea, vomiting, diarrhea  Genitourinary burning with urination, trouble urinating  Musculoskeletal joint aches, joint swelling, back pain  Integumentary  rashes, skin lesions  Neurological focal weakness, focal numbness, trouble speaking, headaches  Psychiatric depression, anxiety, confusion  Endocrine polyuria, polydipsia, cold intolerance, heat intolerance  Hematologic abnormal bruising, abnormal bleeding, unexplained nose bleeds  Allergic/Immunologic recurrent infections, hives, swollen lymph nodes     Past Medical History  He,  has a past medical history of Arteriovenous malformation, Arthritis, Bilateral lower extremity edema, Bladder tumor, CAD (coronary artery disease) (cardiologist--  dr Daneen Schick), COPD with emphysema [facility]), Dyspnea on exertion, ED (erectile dysfunction), First degree AV block, GERD (gastroesophageal reflux disease), History of adenomatous polyp of colon, History of bladder cancer, History of prostate cancer (urologist-  dr Jeffie Pollock-- last PSA 0.02 (summer 2017)), History of ST elevation myocardial infarction (STEMI), Hyperlipidemia, Hypertension, Macular degeneration, Presence of permanent cardiac pacemaker, S/P drug eluting coronary stent placement, Second degree Mobitz I AV block, Seizures ([place]) (per pt son -- no seizure's since 1980), and Vitamin D deficiency.   Surgical History    Past Surgical History:  Procedure Laterality Date  . APPENDECTOMY  2011  . BLEPHAROPLASTY Bilateral revision 02-17-2016  . CARDIAC CATHETERIZATION N/A 04/27/2016   Procedure: Left Heart Cath and Coronary Angiography;  Surgeon: Belva Crome, MD;  Location: [place] [facility];  Service: Cardiovascular;  Laterality: N/A;  acute inferior wall STEMI ;  ostCx to midCx 90%,  ost1st Mrg to 1st Mrg 50%,  mid to distal RCA 100%,  pRCA 80%,  LVEF 45-50% w/ inferobasal akinesis  . CARDIAC CATHETERIZATION N/A 04/27/2016   Procedure: Temporary Pacemaker;   Surgeon: Belva Crome, MD;  Location: [place] [facility];  Service: Cardiovascular;  Laterality: N/A;  mobitz 2  second degree HB w/ heart rate 29bpm  . CARDIAC CATHETERIZATION N/A 04/27/2016   Procedure: Coronary Stent Intervention;  Surgeon: Belva Crome, MD;  Location: [place] [facility];  Service: Cardiovascular;  Laterality: N/A; DES x1 to  Proximal RCA;  DES x1 to Mid RCA  . CATARACT EXTRACTION W/ INTRAOCULAR LENS  IMPLANT, BILATERAL  2016  approx.  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 CYSTOSCOPY  01/19/2012   Procedure: CYSTOSCOPY;  Surgeon: Malka So, MD;  Location: [facility];  Service: Urology;;  no seeds found in bladder  . CYSTOSCOPY W/ RETROGRADES Bilateral 04/19/2016   Procedure: CYSTOSCOPY WITH BILATERAL RETROGRADE PYELOGRAM TRANSURETHRAL RESECTION OF BLADDER TUMOR ;  Surgeon: [name], MD;  Location: [facility];  Service: Urology;  Laterality: Bilateral;  . CYSTOSCOPY WITH BIOPSY N/A 11/10/2016   Procedure: CYSTOSCOPY WITH BIOPSY AND FULGURATION;  Surgeon: [name], MD;  Location[facility];  Service: Urology;  Laterality: N/A;  . INSERT / REPLACE / REMOVE PACEMAKER  06/15/2017  . LEAD EXTRACTION N/A 01/22/2018   Procedure: LEAD EXTRACTION;  Surgeon: Evans Lance, MD;  Location: [place] [facility];  Service: Cardiovascular;  Laterality: N/A;  . PACEMAKER IMPLANT N/A 06/15/2017   Procedure: Pacemaker Implant;  Surgeon: [name], MD;  Location: [place] [facility];  Service: Cardiovascular;  Laterality: N/A;  . PACEMAKER IMPLANT N/A 01/25/2018   Procedure: PACEMAKER IMPLANT;  Surgeon: Evans Lance, MD;  Location: [place] [facility];  Service: Cardiovascular;  Laterality: N/A;  . PPM GENERATOR REMOVAL N/A 01/22/2018   Procedure: PPM GENERATOR REMOVAL;  Surgeon: Evans Lance, MD;  Location: [place] [facility];  Service: Cardiovascular;  Laterality: N/A;  . RADIOACTIVE SEED IMPLANT  01/19/2012    Procedure: RADIOACTIVE SEED IMPLANT;  Surgeon: Malka So, MD;  Location: [facility];  Service: Urology;  Laterality: N/A;  68 seeds implanted  . TRANSTHORACIC ECHOCARDIOGRAM  06/02/2016   ef 60-65%/  trivial MR/  mild TR     Social History   reports that he quit smoking about 47 years ago. He has a 30.00 pack-year smoking history. He has never used smokeless tobacco. He reports current alcohol use.  He reports that he does not use drugs.   Family History   His family history includes Emphysema in his father and mother; Stroke in his mother.  Uncle also had emphysema  Allergies Allergies  Allergen Reactions  . Penicillins Rash    Has patient had a PCN reaction causing immediate rash, facial/tongue/throat swelling, SOB or lightheadedness with hypotension: ##Yes## Has patient had a PCN reaction causing severe rash involving mucus membranes or skin necrosis: No Has patient had a PCN reaction that required hospitalization No Has patient had a PCN reaction occurring within the last 10 years: No If all of the above answers are "NO", then may proceed with Cephalosporin use.      Home Medications  Prior to Admission medications   Medication Sig Start Date End Date Taking? Authorizing Provider  aspirin EC 81 MG tablet Take 81 mg by mouth daily. Swallow whole.   Yes [provider]  atorvastatin (LIPITOR) 20 MG tablet Take 1 tablet (20 mg total) by mouth daily. 07/01/19  Yes Rakes, Connye Burkitt, FNP  calcium carbonate (OSCAL) 1500 (600 Ca) MG TABS tablet Take 600 mg of elemental calcium by mouth daily with breakfast.   Yes [provider]  Cholecalciferol (VITAMIN D3) 50 MCG (2000 UT) capsule Take 2,000 Units by mouth daily.    Yes [provider]  citalopram (CELEXA) 20 MG tablet Take 1 tablet (20 mg total) by mouth daily. 06/05/20  Yes Hassell Done, Mary-Margaret, FNP  clopidogrel (PLAVIX) 75 MG tablet Take 1 tablet (75 mg total) by mouth daily. 07/01/19  Yes  Rakes, Connye Burkitt, FNP  famotidine (PEPCID) 20 MG tablet Take 20 mg by mouth daily.    Yes [provider]  ferrous sulfate 325 (65 FE) MG tablet Take 1 tablet (325 mg total) by mouth daily with breakfast. 10/31/18  Yes Gatha Mayer, MD  MAGNESIUM PO Take 1 tablet by mouth at bedtime.   Yes [provider]  Multiple Vitamins-Minerals (PRESERVISION AREDS 2) CAPS Take 1 capsule by mouth 2 (two) times daily.    Yes [provider]  PHENobarbital (LUMINAL) 64.8 MG tablet Take 1 tablet (64.8 mg total) by mouth 2 (two) times daily. 01/27/20  Yes Martin, Mary-Margaret, FNP  phenytoin (DILANTIN) 100 MG ER capsule TAKE (2) CAPSULES TWICE DAILY. Patient taking differently: Take 200 mg by mouth 2 (two) times daily.  01/27/20  Yes Martin, Mary-Margaret, FNP  polyvinyl alcohol (LIQUIFILM TEARS) 1.4 % ophthalmic solution Place 2 drops into both eyes daily as needed for dry eyes.    Yes [provider]  spironolactone (ALDACTONE) 25 MG tablet Take 1 tablet (25 mg total) by mouth daily. 06/05/20  Yes Chevis Pretty, FNP

## 2020-06-25 NOTE — Progress Notes (Signed)
RT instructed patient and family on the use of flutter valve.  Patient able to demonstrate back good technique. All questions answered.

## 2020-06-25 NOTE — Plan of Care (Signed)
?  Problem: Clinical Measurements: ?Goal: Ability to maintain a body temperature in the normal range will improve ?Outcome: Progressing ?  ?Problem: Respiratory: ?Goal: Ability to maintain adequate ventilation will improve ?Outcome: Progressing ?  ?Problem: Respiratory: ?Goal: Ability to maintain a clear airway will improve ?Outcome: Progressing ?  ?

## 2020-06-26 ENCOUNTER — Inpatient Hospital Stay (HOSPITAL_COMMUNITY): Payer: Medicare Other

## 2020-06-26 DIAGNOSIS — J8410 Pulmonary fibrosis, unspecified: Secondary | ICD-10-CM

## 2020-06-26 DIAGNOSIS — J9601 Acute respiratory failure with hypoxia: Secondary | ICD-10-CM | POA: Diagnosis not present

## 2020-06-26 LAB — COMPREHENSIVE METABOLIC PANEL
ALT: 23 U/L (ref 0–44)
AST: 24 U/L (ref 15–41)
Albumin: 3 g/dL — ABNORMAL LOW (ref 3.5–5.0)
Alkaline Phosphatase: 112 U/L (ref 38–126)
Anion gap: 10 (ref 5–15)
BUN: 23 mg/dL (ref 8–23)
CO2: 28 mmol/L (ref 22–32)
Calcium: 8.7 mg/dL — ABNORMAL LOW (ref 8.9–10.3)
Chloride: 100 mmol/L (ref 98–111)
Creatinine, Ser: 1.17 mg/dL (ref 0.61–1.24)
GFR calc non Af Amer: 56 mL/min — ABNORMAL LOW (ref >60)
Glucose, Bld: 114 mg/dL — ABNORMAL HIGH (ref 70–99)
Potassium: 3.7 mmol/L (ref 3.5–5.1)
Sodium: 138 mmol/L (ref 135–145)
Total Bilirubin: 0.6 mg/dL (ref 0.3–1.2)
Total Protein: 6.1 g/dL — ABNORMAL LOW (ref 6.5–8.1)

## 2020-06-26 LAB — MAGNESIUM: Magnesium: 1.7 mg/dL (ref 1.7–2.4)

## 2020-06-26 LAB — CBC WITH DIFFERENTIAL/PLATELET
Abs Immature Granulocytes: 0.08 10*3/uL — ABNORMAL HIGH (ref 0.00–0.07)
Basophils Absolute: 0.1 10*3/uL (ref 0.0–0.1)
Basophils Relative: 1 %
Eosinophils Absolute: 0.7 10*3/uL — ABNORMAL HIGH (ref 0.0–0.5)
Eosinophils Relative: 5 %
HCT: 32 % — ABNORMAL LOW (ref 39.0–52.0)
Hemoglobin: 10.4 g/dL — ABNORMAL LOW (ref 13.0–17.0)
Immature Granulocytes: 1 %
Lymphocytes Relative: 14 %
Lymphs Abs: 1.9 10*3/uL (ref 0.7–4.0)
MCH: 31 pg (ref 26.0–34.0)
MCHC: 32.5 g/dL (ref 30.0–36.0)
MCV: 95.2 fL (ref 80.0–100.0)
Monocytes Absolute: 2.1 10*3/uL — ABNORMAL HIGH (ref 0.1–1.0)
Monocytes Relative: 16 %
Neutro Abs: 8.3 10*3/uL — ABNORMAL HIGH (ref 1.7–7.7)
Neutrophils Relative %: 63 %
Platelets: 379 10*3/uL (ref 150–400)
RBC: 3.36 MIL/uL — ABNORMAL LOW (ref 4.22–5.81)
RDW: 17.1 % — ABNORMAL HIGH (ref 11.5–15.5)
WBC: 13.2 10*3/uL — ABNORMAL HIGH (ref 4.0–10.5)
nRBC: 0.2 % (ref 0.0–0.2)

## 2020-06-26 LAB — PHOSPHORUS: Phosphorus: 3.4 mg/dL (ref 2.5–4.6)

## 2020-06-26 LAB — SEDIMENTATION RATE: Sed Rate: 35 mm/hr — ABNORMAL HIGH (ref 0–16)

## 2020-06-26 LAB — PSA: Prostatic Specific Antigen: 0.1 ng/mL (ref 0.00–4.00)

## 2020-06-26 MED ORDER — MAGNESIUM SULFATE 2 GM/50ML IV SOLN
2.0000 g | Freq: Once | INTRAVENOUS | Status: AC
  Administered 2020-06-26: 2 g via INTRAVENOUS
  Filled 2020-06-26: qty 50

## 2020-06-26 MED ORDER — FUROSEMIDE 40 MG PO TABS: 20.0000 mg | ORAL_TABLET | Freq: Every day | ORAL | 0 refills | Status: DC

## 2020-06-26 MED ORDER — PANTOPRAZOLE SODIUM 40 MG PO TBEC
40.0000 mg | DELAYED_RELEASE_TABLET | Freq: Every day | ORAL | 0 refills | Status: DC
Start: 2020-06-27 — End: 2020-07-07

## 2020-06-26 MED ORDER — GUAIFENESIN ER 600 MG PO TB12: 1200.0000 mg | ORAL_TABLET | Freq: Two times a day (BID) | ORAL | 0 refills | Status: AC

## 2020-06-26 MED ORDER — ONDANSETRON HCL 4 MG/2ML IJ SOLN
INTRAMUSCULAR | Status: AC
  Administered 2020-06-26: 4 mg
  Filled 2020-06-26: qty 2

## 2020-06-26 NOTE — TOC Progression Note (Signed)
Transition of Care [facility]) - Progression Note    Patient Details  Name: Joshua [name] MRN: [id number] Date of Birth: [birth year]  Transition of Care [facility]) CM/SW [place], LCSW Phone Number: 06/26/2020, 11:00 AM  Clinical Narrative:    Per patient request for [facility] agency with good Medicare ratings, patient has been accepted by [facility]. Will need Home Health RN/PT/OT orders at discharge.    Expected Discharge Plan: [place] Barriers to Discharge: Continued Medical Work up  Expected Discharge Plan and Services Expected Discharge Plan: [place]: [place] arrangements for the past 2 months: Single Family Home                           HH Arranged: RN, PT, OT [facility] Agency: [place] Date [place]: 06/26/20 Time HH Agency Contacted: 1100 Representative spoke with at [place]: [place] ([place]) Interventions    Readmission Risk Interventions No flowsheet data found.

## 2020-06-26 NOTE — Discharge Summary (Signed)
Physician Discharge Summary  Joshua [name] GLO:[id number] DOB: [birth year] DOA: 06/22/2020  PCP: Chevis Pretty, FNP  Admit date: 06/22/2020 Discharge date: 06/26/2020  Admitted From: Home Disposition: Home with Home Health PT/OT  Recommendations for Outpatient Follow-up:  1. Follow up with PCP in 1-2 weeks 2. Follow up with Cardiology Dr. Tamala Julian within 1-2 weeks 3. Follow up with Pulmonary Dr. Vaughan Browner on 07/21/20 at 9:30 AM to have outpatient Follow up Imaging, PFT's and possible BD's 4. Please obtain CMP/CBC, Mag, Phos in one week 5. Please follow up on the following pending results: Autoimmune Labs and serology  Home Health: YES  Equipment/Devices: Supplemental O2 via [place]; Rollator   Discharge Condition: Stable CODE STATUS: FULL CODE Diet recommendation: Heart Healthy Diet  Brief/Interim Summary: HPI per Dr. Shela Leff on 06/22/20 Joshua [name] a 86 y.o.malewith medical history significant ofCAD status post PCI, chronic diastolic CHF, AV block requiring PPM, COPD, GERD, history of prostate treated with seed implant, history of bladder cancer, hypertension, hyperlipidemia, seizures (caused by intracranial bleed from AV malformation status post craniotomy in 1980) sent to the ED today from cardiology office for further evaluation of shortness of breath.[name]  ED Course:Afebrile. Not tachycardic or tachypneic. Oxygen saturation 83% on room  air,improved with 2 L supplemental oxygen. WBC 8.4, hemoglobin 9.6 (baseline 11-12), hematocrit 29.7, platelet 296K.Sodium 136, potassium 4.3, chloride 103, bicarb 23, BUN 21, creatinine 0.9, glucose 101. BNP 700. Initial high-sensitivity troponin 197, repeat pending.EKG with paced rhythm. SARS-CoV-2 PCR test negative. Influenza panel negative. Chest x-ray showing mid and lower lung infiltrates concerning for pneumonia; pulmonary edema less likely though not excluded.  Patient was given IV Levaquin (penicillin allergy). He also received albuterol-ipratropium inhaler treatments, and aspirin 324 mg.  **Interim History Respiratory status is slightly improved but still requiring supplemental oxygen via nasal cannula and very dyspneic on exertion continues to drop his O2 saturation so will obtain a CT of the chest with contrast.  Continuing diuresis and echocardiogram shows an EF of 50 to 63%, grade 1 diastolic CHF.  Chest x-ray shows multifocal pneumonia and today show that he has stable cardiomegaly but diffuse bilateral interstitial prominence again slightly but was progressed from prior exam.  Residual edema or pneumonitis could also present in this fashion.  We will continue IV diuresis and give him twice daily dosing today.  We will also obtain a CT of the chest for further evaluation this was done and showed some fibrotic new/progressive interstitial lung disease on CT which is more prominent than in 2012.  Dr. Lamonte Sakai did not see any active inflammatory changes, groundglass infiltrates and recommending discontinuing antibiotics and following autoimmune labs for completion.  Possible etiologies were chronic autoimmune disease, chronic aspiration or possibly medications and eosinophilic pneumonia.  He had SLP done and MBS was recommended regular diet with thin liquids.  He is adequately diuresed and was -3.6 L since admission and was transitioned to p.o. Lasix 20 mg p.o. daily.  Pulmonology  recommending amatory home  O2 screen and he did not qualify for oxygen and he will be stable to be discharged to follow-up with Dr. Vaughan Browner in the ILD clinic on 07/21/2020 and will also follow-up with his primary cardiologist Dr. Tamala Julian within 1 week.  Currently stable to be discharged with home health services and follow-up with PCP, cardiology and pulmonary outpatient setting.   Discharge Diagnoses:  Principal Problem:   CAP (community acquired pneumonia) Active Problems:   Hypercholesterolemia   Essential hypertension   CHF exacerbation (HCC)   Elevated troponin  Acute Hypoxic respiratory failure due to community-acquired pneumonia and acute on chronic diastolic CHF likely fibrotic lung disease -Patient hypoxic on admission (83% on Room Air) requiring supplemental oxygen and is still hypoxic. Per PT note "2LO2 to maintain SpO2 >88%, desat to 86% on RA"; continues to require supplemental oxygen via nasal cannula -Chest x-ray showed mid and lower lung infiltrates concerning for pneumonia, there is also concern for asymmetric pulmonary edema due to elevated BNP at 700.2.  -Is received Lasix 1 mg daily for the last 3 days and will give twice daily dosing today. -Strict I's and O's and Daily Weights; Patient is -2.8 Liters but there have been no I's and O's for last 24 hours noted -Weight is down 10 pounds since admission -Due to penicillin allergy he was started on IV Levaquin and will continue but change to po and continue for 5 days total -Repeat 2D echo done and showed "Compared to prior TTE in 2017, the LVEF appears low normal with EF 50-55%. RV systolic function is mildly depressed. Otherwise no significant changes.";  He continues to have grade 1 diastolic dysfunction -SpO2: 98 % O2 Flow Rate (L/min): 2 L/min -Repeat CXR this AM showed "Cardiac pacer with lead tips over the right atrium right ventricle. Stable cardiomegaly. Diffuse bilateral interstitial prominence again noted slightly  progressed from prior exam. Interstitial edema and/or pneumonitis could present in this fashion. No pleural effusion or pneumothorax." -Continue to Monitor Respiratory Status Carefully and Wean O2 as tolerated -COVID Testing was Negative  -Continue to Monitor for S/Sx of Volume Overload  -Because he is failing to progress significantly we will order a CT of the chest to further evaluate and will obtain a Pulmonary Consultation -CT Scan showed "Emphysema with diffuse mild cylindrical bronchiectasis and subpleural reticulation suggesting component of underlying fibrotic lung disease.  Apparent loculated fluid posterior major fissure on the right with subpleural rind of consolidative opacity in the deep posterior right costophrenic sulcus. Imaging features are nonspecific and may be related to infectious/inflammatory etiology, but follow-up CT in 3 months recommended to ensure resolution. Cholelithiasis. Aortic Atherosclerosis."  -Patient appears to be adequately diuresed and is -3.65 L since admission down 10 pounds.  You.  Euvolemic and currently not in acute exacerbation of CHF anymore. -Pulmonary evaluating and felt that this is multifactorial dyspnea and hypoxia and feels that this is secondary to possibly new progressive interstitial lung disease and possible etiologies could be autoimmune disease, chronic aspiration, possibly medications or eosinophilic pneumonia -Dr. Lamonte Sakai recommends discontinuing antibiotics, follow autoimmune labs for completion which are still pending, swallow evaluation was done and recommended regular diet with thin liquids.  Since he feels there is no evidence of acute pneumonitis or acute ILD flare by imaging there defer corticosteroids.  Dr. Lamonte Sakai also recommends diuresing as tolerated however he is euvolemic and will discharge on 20 mg p.o. daily.  Amatory home O2 screen was done and he did desaturate and will be discharged on home  oxygen.  Dr. Lamonte Sakai recommends that he can be  discharged from the hospital and he is going to be following up in the ILD and COPD clinics and will need outpatient follow-up imaging, PFTs as well as possible bronchodilators. -Chest x-ray today showed "Cardiac pacer in stable position. Stable cardiomegaly. Bilateral interstitial prominence unchanged. No alveolar infiltrates." -I have discussed with the patient about discharging home and is agreeable.  He will go home on oxygen and will go home with home health PT OT and RN.  He will need to follow-up with PCP, cardiology and pulmonology in the outpatient setting.  Patient's cardiologist will call him to schedule appointment within 1 to 2 weeks.  Leukocytosis -Worsened from yesterday as WBC went from 9.5 -> 13.5 but is slightly improved today and is 13.2 -Currently he is Afebrile -Will continue to Monitor Carefully and Trend -Repeat CBC within 1-2 weeks   History of Coronary Artery Disease status post PCI with DES to RCA with 50% ostial to 60% segmental mid to distal left main, and 90% thrombus filled ostial to proximal circumflex, with distal right coronary filled by collaterals from left-to-right Hx of STEMI Elevated Troponin -No Active Chest Pain; Has a PPM -High-sensitivity troponin elevated but overall flat. Troponin went from 197 -> 266 ->294 -> 294 -He is not having any chest pain, this is less likely ACS and more likely demand ischemia -Continue cardiac monitoring, continue home medications.  -Admitting MD discussed with on-call Cardiologist who agrees that this is likely due to demand ischemia -He is to follow-up with his primary cardiologist Dr. Pernell Dupre in 1 to 2 weeks  Normocytic Anemia likely Iron Deficiency  -Hemoglobin 9.6 on Admission, baseline around 11. We will/medically stable at 10.4/32.0 prior to discharge -He reports dark stool but he is on iron supplements.  -C/w Ferrous Sulfate 325 mg po Daily -In February 2020 he had an upper and lower endoscopic evaluation  by gastroenterology without significant findings. -Anemia Panel done and showed " iron level of 186, TIBC of 44, TIBC of 230, saturation ratios of 81%, ferritin level of 152, folate level of 12.8, and vitamin B12 of 343" -Continue to Monitor for S/Sx of Bleeding; Currently no overt bleeding noted -Repeat CBC with 1 week  COPD -Stable, no wheezing, nebulizers as needed -As above -Will have Pulmonary Evaluate given above  and they are recommending outpatient follow-up  Essential Hypertension -Last BP was 130/47 this AM  -Resume home medications with Spironolactone 25 mg po Daily  -Continue to Monitor BP's per Protocol   Hyperlipidemia -C/w Atorvastatin 20 mg po Daily   History of Seizures -Continue home medications of Phenytonin 200 mg po BID and Phenobarbital 64.8 mg po BID   Hypokalemia -Is improved and is now 3.7 -Continue to Monitor and Replete as Necessary -Repeat CMP in the AM   GERD -C/w Pantoprazole 40 mg po Daily  Discharge Instructions  Discharge Instructions    (HEART FAILURE PATIENTS) Call MD:  Anytime you have any of the following symptoms: 1) 3 pound weight gain in 24 hours or 5 pounds in 1 week 2) shortness of breath, with or without a dry hacking cough 3) swelling in the hands, feet or stomach 4) if you have to sleep on extra pillows at night in order to breathe.   Complete by: As directed    Call MD for:  difficulty breathing, headache or visual disturbances   Complete by: As directed    Call MD for:  extreme fatigue  Complete by: As directed    Call MD for:  hives   Complete by: As directed    Call MD for:  persistant dizziness or light-headedness   Complete by: As directed    Call MD for:  persistant nausea and vomiting   Complete by: As directed    Call MD for:  redness, tenderness, or signs of infection (pain, swelling, redness, odor or green/yellow discharge around incision site)   Complete by: As directed    Call MD for:  severe uncontrolled  pain   Complete by: As directed    Call MD for:  temperature >100.4   Complete by: As directed    Diet - low sodium heart healthy   Complete by: As directed    1500 mL Fluid Restriction   Discharge instructions   Complete by: As directed    You were cared for by a hospitalist during your hospital stay. If you have any questions about your discharge medications or the care you received while you were in the hospital after you are discharged, you can call the unit and ask to speak with the hospitalist on call if the hospitalist that took care of you is not available. Once you are discharged, your primary care physician will handle any further medical issues. Please note that NO REFILLS for any discharge medications will be authorized once you are discharged, as it is imperative that you return to your primary care physician (or establish a relationship with a primary care physician if you do not have one) for your aftercare needs so that they can reassess your need for medications and monitor your lab values.  Follow up with PCP, Cardiology, and Pulmonary in the outpatient setting. Take all medications as prescribed. If symptoms change or worsen please return to the ED for evaluation   Increase activity slowly   Complete by: As directed      Allergies as of 06/26/2020      Reactions   Penicillins Rash   Has patient had a PCN reaction causing immediate rash, facial/tongue/throat swelling, SOB or lightheadedness with hypotension: ##Yes## Has patient had a PCN reaction causing severe rash involving mucus membranes or skin necrosis: No Has patient had a PCN reaction that required hospitalization No Has patient had a PCN reaction occurring within the last 10 years: No If all of the above answers are "NO", then may proceed with Cephalosporin use.      Medication List    TAKE these medications   aspirin EC 81 MG tablet Take 81 mg by mouth daily. Swallow whole.   atorvastatin 20 MG  tablet Commonly known as: LIPITOR Take 1 tablet (20 mg total) by mouth daily.   calcium carbonate 1500 (600 Ca) MG Tabs tablet Commonly known as: OSCAL Take 600 mg of elemental calcium by mouth daily with breakfast.   citalopram 20 MG tablet Commonly known as: CELEXA Take 1 tablet (20 mg total) by mouth daily.   clopidogrel 75 MG tablet Commonly known as: PLAVIX Take 1 tablet (75 mg total) by mouth daily.   famotidine 20 MG tablet Commonly known as: PEPCID Take 20 mg by mouth daily.   ferrous sulfate 325 (65 FE) MG tablet Take 1 tablet (325 mg total) by mouth daily with breakfast.   furosemide 40 MG tablet Commonly known as: Lasix Take 0.5 tablets (20 mg total) by mouth daily.   guaiFENesin 600 MG 12 hr tablet Commonly known as: MUCINEX Take 2 tablets (1,200 mg total) by mouth  2 (two) times daily for 5 days.   MAGNESIUM PO Take 1 tablet by mouth at bedtime.   pantoprazole 40 MG tablet Commonly known as: PROTONIX Take 1 tablet (40 mg total) by mouth daily. Start taking on: June 27, 2020   PHENobarbital 64.8 MG tablet Commonly known as: LUMINAL Take 1 tablet (64.8 mg total) by mouth 2 (two) times daily.   phenytoin 100 MG ER capsule Commonly known as: DILANTIN TAKE (2) CAPSULES TWICE DAILY. What changed:   how much to take  how to take this  when to take this  additional instructions   polyvinyl alcohol 1.4 % ophthalmic solution Commonly known as: LIQUIFILM TEARS Place 2 drops into both eyes daily as needed for dry eyes.   PreserVision AREDS 2 Caps Take 1 capsule by mouth 2 (two) times daily.   spironolactone 25 MG tablet Commonly known as: ALDACTONE Take 1 tablet (25 mg total) by mouth daily.   Vitamin D3 50 MCG (2000 UT) capsule Take 2,000 Units by mouth daily.            Durable Medical Equipment  (From admission, onward)         Start     Ordered   06/26/20 1108  For home use only DME oxygen  Once       Question Answer Comment   Length of Need 6 Months   Mode or (Route) Nasal cannula   Liters per Minute 2   Oxygen delivery system Gas      06/26/20 1107          Follow-up Information    Marshell Garfinkel, MD Follow up on 07/21/2020.   Specialty: Pulmonary Disease Why: 9:30am Contact information: [address] [place] [address] [phone]        [facility] Follow up.   Specialty: Home Health Services Why: Home Health RN/PT/OT services arranged. They will contact you about 48 hours after discharge to schedule the first visit.  Contact information: [address] [place] [address] [phone]        Chevis Pretty, FNP. Call.   Specialty: Family Medicine Why: Follow up within 1-2 weeks Contact information: [place] [address] [phone]        Belva Crome, MD Follow up.   Specialty: Cardiology Why: Cardiology office will call for appointment Contact information: [address] [place] [address] [phone]              Allergies  Allergen Reactions  . Penicillins Rash    Has patient had a PCN reaction causing immediate rash, facial/tongue/throat swelling, SOB or lightheadedness with hypotension: ##Yes## Has patient had a PCN reaction causing severe rash involving mucus membranes or skin necrosis: No Has patient had a PCN reaction that required hospitalization No Has patient had a PCN reaction occurring within the last 10 years: No If all of the above answers are "NO", then may proceed with Cephalosporin use.     Consultations:  Pulmonary  Procedures/Studies: CT CHEST WO CONTRAST  Result Date: 06/26/2020 CLINICAL DATA:  Progressive dyspnea with abnormal chest x-ray. EXAM: CT CHEST WITHOUT CONTRAST TECHNIQUE: Multidetector CT imaging of the chest was performed following the standard protocol without IV contrast. COMPARISON:  04/22/2011 FINDINGS: Cardiovascular: Heart is  enlarged. Coronary artery calcification is evident. Atherosclerotic calcification is noted in the wall of the thoracic aorta. Right permanent pacemaker evident. Mediastinum/Nodes: Scattered upper normal mediastinal lymph nodes identified.  No evidence for gross hilar lymphadenopathy although assessment is limited by the lack of intravenous contrast on today's study. The esophagus has normal imaging features. There is no axillary lymphadenopathy. Lungs/Pleura: 9 mm polypoid lesion posterior trachea as very low-density on soft tissue windows, likely mucus or secretions. Centrilobular and paraseptal emphysema evident. Bronchial wall thickening with mild diffuse bronchiectasis noted bilaterally. Subpleural reticulation again noted with mild basilar predominance. This finding was present on the study from 20/12 and is mildly progressive in the interval. Apparent loculated fluid posterior major fissure on the right (69/4 and sagittal 44/7). Subpleural rind of consolidative opacity noted deep posterior right costophrenic sulcus no overtly suspicious pulmonary nodule or mass. No pleural effusion. Upper Abdomen: Small calcified gallstones evident. Musculoskeletal: No worrisome lytic or sclerotic osseous abnormality. IMPRESSION: 1. Emphysema with diffuse mild cylindrical bronchiectasis and subpleural reticulation suggesting component of underlying fibrotic lung disease. 2. Apparent loculated fluid posterior major fissure on the right with subpleural rind of consolidative opacity in the deep posterior right costophrenic sulcus. Imaging features are nonspecific and may be related to infectious/inflammatory etiology, but follow-up CT in 3 months recommended to ensure resolution. 3. Cholelithiasis. 4. Aortic Atherosclerosis (ICD10-I70.0) and Emphysema (ICD10-J43.9). Electronically Signed   By: Misty Stanley M.D.   On: 06/26/2020 05:15   DG CHEST PORT 1 VIEW  Result Date: 06/26/2020 CLINICAL DATA:  Shortness of breath. EXAM:  PORTABLE CHEST 1 VIEW COMPARISON:  CT 06/25/2020. Chest x-ray 06/25/2020, 06/24/2020, 06/16/2017. FINDINGS: Cardiac pacer in stable position. Stable cardiomegaly. No pulmonary venous congestion. Bilateral interstitial prominence unchanged. No alveolar infiltrates. No pleural effusion or pneumothorax. IMPRESSION: 1. Cardiac pacer in stable position. Stable cardiomegaly. 2. Bilateral interstitial prominence unchanged. No alveolar infiltrates. Electronically Signed   By: Marcello Moores  Register   On: 06/26/2020 07:34   DG CHEST PORT 1 VIEW  Result Date: 06/25/2020 CLINICAL DATA:  Shortness of breath. EXAM: PORTABLE CHEST 1 VIEW COMPARISON:  06/24/2020.  06/22/2020.  11/25/2019. FINDINGS: Cardiac pacer with lead tips over the right atrium right ventricle. Stable cardiomegaly. Diffuse bilateral interstitial prominence again noted slightly progressed from prior exam. Interstitial edema and/or pneumonitis could present in this fashion. No pleural effusion or pneumothorax. IMPRESSION: 1. Cardiac pacer with lead tips over the right atrium and right ventricle. Stable cardiomegaly. 2. Diffuse bilateral interstitial prominence again noted slightly progressed from prior exam. Interstitial edema and/or pneumonitis could present in this fashion. Electronically Signed   By: Marcello Moores  Register   On: 06/25/2020 08:12   DG CHEST PORT 1 VIEW  Result Date: 06/24/2020 CLINICAL DATA:  Shortness of breath EXAM: PORTABLE CHEST 1 VIEW COMPARISON:  06/22/2020 FINDINGS: Mild subpleural patchy opacities in the right mid lung, lingula, and bilateral lower lobes, favoring multifocal pneumonia, grossly unchanged. Atypical/viral infection is possible. No pleural effusion or pneumothorax. Cardiomegaly.  Right subclavian pacemaker. IMPRESSION: Suspected multifocal pneumonia, grossly unchanged. Atypical/viral infection is possible. Electronically Signed   By: Julian Hy M.D.   On: 06/24/2020 10:26   DG Chest Portable 1 View  Result Date:  06/22/2020 CLINICAL DATA:  Shortness of breath and hypoxia over the last 5 days. EXAM: PORTABLE CHEST 1 VIEW COMPARISON:  11/25/2019 FINDINGS: Pacemaker as seen previously. Heart size upper limits of normal. Chronic aortic atherosclerosis. Abnormal mid and lower lung infiltrates most consistent with pneumonia. Pulmonary edema less likely though not excluded. No visible effusion. No acute bone finding. IMPRESSION: Abnormal mid and lower lung infiltrates most consistent with pneumonia. Pulmonary edema less likely though not excluded. Electronically Signed   By:  Nelson Chimes M.D.   On: 06/22/2020 16:44   DG Swallowing Func-Speech Pathology  Result Date: 06/26/2020 Objective Swallowing Evaluation: Type of Study: MBS-Modified Barium Swallow Study  Patient Details Name: Joshua [name] MRN: [id number] Date of Birth: 04/02/1932 Today's Date: 06/26/2020 Time: SLP Start Time (ACUTE ONLY): 1234 -SLP Stop Time (ACUTE ONLY): 1310 SLP Time Calculation (min) (ACUTE ONLY): 36 min Past Medical History: Past Medical History: Diagnosis Date . Arteriovenous malformation   caused intracerebrial bleed w seizure -- post craniotomy 1980 . Arthritis  . Bilateral lower extremity edema  . Bladder tumor  . CAD (coronary artery disease) cardiologist--  dr Daneen Schick  a. 04/27/2016 PCI with DES to RCA with 50% ostial to 60% segmental mid to distal left main, and 90% thrombus filled ostial to proximal circumflex, with distal right coronary filled by collaterals from left-to-right . COPD with emphysema ([place])  . Dyspnea on exertion  . ED (erectile dysfunction)  . First degree AV block  . GERD (gastroesophageal reflux disease)  . History of adenomatous polyp of colon   09-22-2008 . History of bladder cancer   s/p  resection bladder tumor 04-19-2016  non-invasive low-grade urothelial carcinoma . History of prostate cancer urologist-  dr Jeffie Pollock-- last PSA 0.02 (summer 2017)  dx 02/ 2013-- Stage T2a, Gleason 7, PSA 1.58--  s/p  radioactive prostate seed  implants 01-19-2012 . History of ST elevation myocardial infarction (STEMI)   04-27-2016 . Hyperlipidemia  . Hypertension  . Macular degeneration  . Presence of permanent cardiac pacemaker  . S/P drug eluting coronary stent placement   04-27-2016  . Second degree [place]itz I AV block   occasional w/ first degree heart block  per cardiologist note by dr Daneen Schick . Seizures ([place]) per pt son -- no seizure's since 1980  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  arteriorvenous cerebral malformation s/p  craniotomy 1980 . Vitamin D deficiency  Past Surgical History: Past Surgical History: Procedure Laterality Date . APPENDECTOMY  2011 . BLEPHAROPLASTY Bilateral revision 02-17-2016 . CARDIAC CATHETERIZATION N/A 04/27/2016  Procedure: Left Heart Cath and Coronary Angiography;  Surgeon: Belva Crome, MD;  Location: [facility];  Service: Cardiovascular;  Laterality: N/A;  acute inferior wall STEMI ;  ostCx to midCx 90%,  ost1st Mrg to 1st Mrg 50%,  mid to distal RCA 100%,  pRCA 80%,  LVEF 45-50% w/ inferobasal akinesis . CARDIAC CATHETERIZATION N/A 04/27/2016  Procedure: Temporary Pacemaker;  Surgeon: Belva Crome, MD;  Location: [facility];  Service: Cardiovascular;  Laterality: N/A;  mobitz 2  second degree HB w/ heart rate 29bpm . CARDIAC CATHETERIZATION N/A 04/27/2016  Procedure: Coronary Stent Intervention;  Surgeon: Belva Crome, MD;  Location: [facility];  Service: Cardiovascular;  Laterality: N/A; DES x1 to  Proximal RCA;  DES x1 to Mid RCA . CATARACT EXTRACTION W/ INTRAOCULAR LENS  IMPLANT, BILATERAL  2016  approx. 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 CYSTOSCOPY  01/19/2012  Procedure: CYSTOSCOPY;  Surgeon: Malka So, MD;  Location: [facility];  Service: Urology;;  no seeds found in bladder . CYSTOSCOPY W/ RETROGRADES Bilateral 04/19/2016  Procedure: CYSTOSCOPY WITH BILATERAL RETROGRADE PYELOGRAM TRANSURETHRAL RESECTION  OF BLADDER TUMOR ;  Surgeon: [name], MD;  Location: [facility];  Service: Urology;  Laterality: Bilateral; . CYSTOSCOPY WITH BIOPSY N/A 11/10/2016  Procedure: CYSTOSCOPY WITH BIOPSY AND FULGURATION;  Surgeon: [name], MD;  Location: [facility];  Service: Urology;  Laterality: N/A; . INSERT / REPLACE / REMOVE PACEMAKER  06/15/2017 . LEAD EXTRACTION N/A 01/22/2018  Procedure: LEAD EXTRACTION;  Surgeon: Evans Lance, MD;  Location: [facility];  Service: Cardiovascular;  Laterality: N/A; . PACEMAKER IMPLANT N/A 06/15/2017  Procedure: Pacemaker Implant;  Surgeon: [name], MD;  Location: [facility];  Service: Cardiovascular;  Laterality: N/A; . PACEMAKER IMPLANT N/A 01/25/2018  Procedure: PACEMAKER IMPLANT;  Surgeon: Evans Lance, MD;  Location: [facility];  Service: Cardiovascular;  Laterality: N/A; . PPM GENERATOR REMOVAL N/A 01/22/2018  Procedure: PPM GENERATOR REMOVAL;  Surgeon: Evans Lance, MD;  Location: [facility];  Service: Cardiovascular;  Laterality: N/A; . RADIOACTIVE SEED IMPLANT  01/19/2012  Procedure: RADIOACTIVE SEED IMPLANT;  Surgeon: Malka So, MD;  Location: [facility];  Service: Urology;  Laterality: N/A;  68 seeds implanted . TRANSTHORACIC ECHOCARDIOGRAM  06/02/2016  ef 60-65%/  trivial MR/  mild TR HPI: Joshua [name] is a 86 y.o. male with medical history significant of CAD status post PCI, chronic diastolic CHF, AV block requiring PPM, COPD, GERD, history of prostate treated with seed implant, history of bladder cancer, hypertension, hyperlipidemia, seizures (caused by intracranial bleed from AV malformation status post craniotomy in 1980) sent to the ED today from cardiology office for further evaluation of shortness of breath.  Patient reports having dyspnea on exertion. Hx of mild pharyngeal dysphagia per prior MBSS 2017.  Subjective: alert, upright in chair for procedure Assessment / Plan / Recommendation CHL IP CLINICAL  IMPRESSIONS 06/26/2020 Clinical Impression Pt presents with a mild oral and moderate pharyngeal dysphagia of unknown etiology wtih deficits in swallow safety and efficiency; worsened function as compared to MBSS 2017.  Delays in oral bolus propulsion exhibited across POs. Pt with silent aspiration of thin liquids  before the swallow due to reduced timing and efficiency of laryngeal vestibule closure, reduced glottic closure, and diminished sensation. Aspiration of thin liquids was eliminated with use of a chin tuck in 3/3 trials. Incidents of trace transient penetration occasionally occured with use of chin tuck and thin liquids. Deficits also exhibited with reduced laryngeal elevation, decreased base of tongue retraction allowing for mild vallecular and mild lateral channel residuals. Brief 13 mm barium tablet stasis also noted in valleculae, clearing with second reflexive swallow. Recommend thin liquids with strict adherence to chin tuck, regular textures, and medicines in puree. Pt was edcuated extensively regarding results of exam and appropriate compensatory strategies.  SLP Visit Diagnosis Dysphagia, oropharyngeal phase (R13.12) Attention and concentration deficit following -- Frontal lobe and executive function deficit following -- Impact on safety and function Moderate aspiration risk   CHL IP TREATMENT RECOMMENDATION 06/26/2020 Treatment Recommendations Therapy as outlined in treatment plan below   Prognosis 06/26/2020 Prognosis for Safe Diet Advancement Good Barriers to Reach Goals Time post onset Barriers/Prognosis Comment -- CHL IP DIET RECOMMENDATION 06/26/2020 SLP Diet Recommendations Regular solids;Thin liquid Liquid Administration via Cup Medication Administration Whole meds with puree Compensations Slow rate;Small sips/bites;Minimize environmental distractions;Clear throat intermittently;Multiple dry swallows after each bite/sip;Chin tuck Postural Changes Remain semi-upright after after feeds/meals  (Comment);Seated upright at 90 degrees   CHL IP OTHER RECOMMENDATIONS 06/26/2020 Recommended Consults -- Oral Care Recommendations Oral care BID Other Recommendations --   CHL IP FOLLOW UP RECOMMENDATIONS 06/26/2020 Follow up Recommendations 24 hour supervision/assistance   CHL IP FREQUENCY AND DURATION 06/26/2020 Speech Therapy Frequency (ACUTE ONLY) min 1 x/week Treatment Duration  1 week      CHL IP ORAL PHASE 06/26/2020 Oral Phase Impaired Oral - Pudding Teaspoon -- Oral - Pudding Cup -- Oral - Honey Teaspoon -- Oral - Honey Cup -- Oral - Nectar Teaspoon -- Oral - Nectar Cup Weak lingual manipulation;Reduced posterior propulsion;Delayed oral transit Oral - Nectar Straw -- Oral - Thin Teaspoon -- Oral - Thin Cup Weak lingual manipulation;Reduced posterior propulsion;Delayed oral transit Oral - Thin Straw Weak lingual manipulation;Reduced posterior propulsion;Delayed oral transit Oral - Puree Delayed oral transit;Weak lingual manipulation Oral - Mech Soft -- Oral - Regular Decreased bolus cohesion;Reduced posterior propulsion;Weak lingual manipulation;Delayed oral transit Oral - Multi-Consistency -- Oral - Pill Delayed oral transit;Weak lingual manipulation Oral Phase - Comment --  CHL IP PHARYNGEAL PHASE 06/26/2020 Pharyngeal Phase Impaired Pharyngeal- Pudding Teaspoon -- Pharyngeal -- Pharyngeal- Pudding Cup -- Pharyngeal -- Pharyngeal- Honey Teaspoon -- Pharyngeal -- Pharyngeal- Honey Cup -- Pharyngeal -- Pharyngeal- Nectar Teaspoon -- Pharyngeal -- Pharyngeal- Nectar Cup Delayed swallow initiation-vallecula;Reduced tongue base retraction;Reduced laryngeal elevation;Pharyngeal residue - valleculae;Pharyngeal residue - pyriform Pharyngeal -- Pharyngeal- Nectar Straw -- Pharyngeal -- Pharyngeal- Thin Teaspoon -- Pharyngeal -- Pharyngeal- Thin Cup Delayed swallow initiation-pyriform sinuses;Reduced epiglottic inversion;Reduced airway/laryngeal closure;Penetration/Aspiration before swallow;Penetration/Aspiration during  swallow;Pharyngeal residue - pyriform;Pharyngeal residue - valleculae;Reduced tongue base retraction;Reduced laryngeal elevation;Lateral channel residue Pharyngeal Material enters airway, passes BELOW cords without attempt by patient to eject out (silent aspiration) Pharyngeal- Thin Straw Reduced epiglottic inversion;Delayed swallow initiation-pyriform sinuses;Penetration/Aspiration before swallow;Penetration/Aspiration during swallow;Pharyngeal residue - valleculae;Pharyngeal residue - pyriform;Lateral channel residue;Compensatory strategies attempted (with notebox);Other (Comment) Pharyngeal Material enters airway, remains ABOVE vocal cords then ejected out Pharyngeal- Puree Delayed swallow initiation-vallecula;Reduced tongue base retraction;Pharyngeal residue - valleculae Pharyngeal -- Pharyngeal- Mechanical Soft -- Pharyngeal -- Pharyngeal- Regular Delayed swallow initiation-vallecula;Pharyngeal residue - valleculae;Reduced tongue base retraction Pharyngeal -- Pharyngeal- Multi-consistency -- Pharyngeal -- Pharyngeal- Pill Delayed swallow initiation-vallecula;Pharyngeal residue - valleculae;Penetration/Aspiration before swallow;Penetration/Aspiration during swallow;Compensatory strategies attempted (with notebox);Other (Comment) Pharyngeal Material enters airway, remains ABOVE vocal cords then ejected out Pharyngeal Comment --  No flowsheet data found. Chelsea E Hartness MA, CCC-SLP Acute Rehabilitation Services 06/26/2020, 1:30 PM              ECHOCARDIOGRAM COMPLETE  Result Date: 06/25/2020    ECHOCARDIOGRAM REPORT   Patient Name:   Joshua [name] Date of Exam: 06/23/2020 Medical Rec #:  [id number]      Height:       73.0 in Accession #:    [id number]     Weight:       184.0 lb Date of Birth:  [birth year]      BSA:          2.077 m Patient Age:    26 years       BP:           146/72 mmHg Patient Gender: M              HR:           66 bpm. Exam Location:  Inpatient Procedure: 2D Echo, Cardiac Doppler and Color  Doppler Indications:    CHF-Acute Diastolic 270.62 / B76.28  History:        Patient has prior history of Echocardiogram examinations, most                 recent 06/02/2016. Previous Myocardial Infarction and CAD, COPD,                 Signs/Symptoms:Dyspnea; Risk Factors:Hypertension and  Dyslipidemia.  Sonographer:    Bernadene Person RDCS Referring Phys: [id number] [place]  1. Left ventricular ejection fraction, by estimation, is 50 to 55%. The left ventricle has low normal function. The left ventricle has no regional wall motion abnormalities. There is mild concentric left ventricular hypertrophy. Left ventricular diastolic parameters are consistent with Grade I diastolic dysfunction (impaired relaxation).  2. Right ventricular systolic function is mildly reduced. The right ventricular size is normal. There is moderately elevated pulmonary artery systolic pressure.  3. Right atrial size was mildly dilated.  4. The mitral valve is normal in structure. Mild mitral valve regurgitation.  5. The aortic valve is tricuspid. There is mild calcification of the aortic valve. There is mild thickening of the aortic valve. Aortic valve regurgitation is not visualized.  6. Aortic dilatation noted. There is mild dilatation of the aortic root, measuring 37 mm.  7. The inferior vena cava is dilated in size with >50% respiratory variability, suggesting right atrial pressure of 8 mmHg. Comparison(s): Compared to prior TTE in 2017, the LVEF appears low normal with EF 50-55%. RV systolic function is mildly depressed. Otherwise no significant changes. FINDINGS  Left Ventricle: Left ventricular ejection fraction, by estimation, is 50 to 55%. The left ventricle has low normal function. The left ventricle has no regional wall motion abnormalities. The left ventricular internal cavity size was normal in size. There is mild concentric left ventricular hypertrophy. Abnormal (paradoxical) septal motion,  consistent with RV pacemaker. Left ventricular diastolic parameters are consistent with Grade I diastolic dysfunction (impaired relaxation). Right Ventricle: The right ventricular size is normal. Right vetricular wall thickness was not assessed. Right ventricular systolic function is mildly reduced. There is moderately elevated pulmonary artery systolic pressure. The tricuspid regurgitant velocity is 3.54 m/s, and with an assumed right atrial pressure of 3 mmHg, the estimated right ventricular systolic pressure is 10.2 mmHg. Left Atrium: Left atrial size was normal in size. Right Atrium: Right atrial size was mildly dilated. Pericardium: There is no evidence of pericardial effusion. Mitral Valve: The mitral valve is normal in structure. There is mild thickening of the mitral valve leaflet(s). Mild to moderate mitral annular calcification. Mild mitral valve regurgitation. Tricuspid Valve: The tricuspid valve is normal in structure. Tricuspid valve regurgitation is mild. Aortic Valve: The aortic valve is tricuspid. There is mild calcification of the aortic valve. There is mild thickening of the aortic valve. Aortic valve regurgitation is not visualized. Pulmonic Valve: The pulmonic valve was grossly normal. Pulmonic valve regurgitation is trivial. Aorta: Aortic dilatation noted. There is mild dilatation of the aortic root, measuring 37 mm. Venous: The inferior vena cava is dilated in size with greater than 50% respiratory variability, suggesting right atrial pressure of 8 mmHg. IAS/Shunts: No atrial level shunt detected by color flow Doppler. Additional Comments: A pacer wire is visualized.  LEFT VENTRICLE PLAX 2D LVIDd:         4.78 cm LVIDs:         3.54 cm LV PW:         1.07 cm LV IVS:        1.06 cm LVOT diam:     2.00 cm LV SV:         57 LV SV Index:   27 LVOT Area:     3.14 cm  LV Volumes (MOD) LV vol s, MOD A2C: 45.8 ml LV vol s, MOD A4C: 39.5 ml RIGHT VENTRICLE RV S prime:     10.50 cm/s TAPSE (M-mode):  1.2  cm LEFT ATRIUM             Index       RIGHT ATRIUM           Index LA diam:        3.70 cm 1.78 cm/m  RA Area:     19.90 cm LA Vol (A2C):   52.8 ml 25.42 ml/m RA Volume:   58.40 ml  28.12 ml/m LA Vol (A4C):   44.3 ml 21.33 ml/m LA Biplane Vol: 49.1 ml 23.64 ml/m  AORTIC VALVE LVOT Vmax:   96.00 cm/s LVOT Vmean:  65.250 cm/s LVOT VTI:    0.181 m  AORTA Ao Root diam: 3.70 cm Ao Asc diam:  3.20 cm TRICUSPID VALVE TR Peak grad:   50.1 mmHg TR Vmax:        354.00 cm/s  SHUNTS Systemic VTI:  0.18 m Systemic Diam: 2.00 cm Gwyndolyn Kaufman MD Electronically signed by Gwyndolyn Kaufman MD Signature Date/Time: 06/25/2020/12:03:09 PM    Final    CUP PACEART REMOTE DEVICE CHECK  Result Date: 06/09/2020 Scheduled remote reviewed. Normal device function. Next remote 91 days- JBox, RN/CVRS    Subjective: Seen and examined at bedside and he is doing much better.  Continues to be short of breath when ambulating.  No chest pain, lightheadedness or dyspnea at rest.  No nausea or vomiting.  No other concerns at this time and O2 saturations remain well with supplemental oxygen.  Pulmonary has cleared the patient for discharge only to follow-up in outpatient setting.  Arrangements have been made for the ILD and COPD clinics.  He is stable to be discharged and he will be sent home on diuresis and he understands agrees with plan of care.  The patient's daughter was updated and she agrees with the plan of care.  Discharge Exam: Vitals:   06/25/20 2314 06/26/20 0406  BP: (!) 110/59 (!) 123/49  Pulse: 65 61  Resp: 19 20  Temp: 98.3 F (36.8 C) 98.5 F (36.9 C)  SpO2: 99% 93%   Vitals:   06/25/20 1230 06/25/20 1922 06/25/20 2314 06/26/20 0406  BP: (!) 130/47 (!) 118/52 (!) 110/59 (!) 123/49  Pulse: (!) 59 62 65 61  Resp: (!) 21 18 19 20   Temp: 98.3 F (36.8 C) 98.5 F (36.9 C) 98.3 F (36.8 C) 98.5 F (36.9 C)  TempSrc: Oral Oral Oral Oral  SpO2: 98% 97% 99% 93%  Weight:      Height:       General:  Pt is alert, awake, not in acute distress Cardiovascular: RRR, S1/S2 +, no rubs, no gallops Respiratory: CTA bilaterally with diminished breath sounds, no wheezing, no rhonchi; wearing 2 L supplemental oxygen via nasal cannula Abdominal: Soft, NT, ND, bowel sounds + Extremities: no extremity edema, no cyanosis  The results of significant diagnostics from this hospitalization (including imaging, microbiology, ancillary and laboratory) are listed below for reference.    Microbiology: Recent Results (from the past 240 hour(s))  Respiratory Panel by RT PCR (Flu A&B, Covid) - Nasopharyngeal Swab     Status: None   Collection Time: 06/22/20  3:57 PM   Specimen: Nasopharyngeal Swab  Result Value Ref Range Status   SARS Coronavirus 2 by RT PCR NEGATIVE NEGATIVE Final    Comment: (NOTE) SARS-CoV-2 target nucleic acids are NOT DETECTED.  The SARS-CoV-2 RNA is generally detectable in upper respiratoy specimens during the acute phase of infection. The lowest concentration of SARS-CoV-2 viral copies this assay  can detect is 131 copies/mL. A negative result does not preclude SARS-Cov-2 infection and should not be used as the sole basis for treatment or other patient management decisions. A negative result may occur with  improper specimen collection/handling, submission of specimen other than nasopharyngeal swab, presence of viral mutation(s) within the areas targeted by this assay, and inadequate number of viral copies (<131 copies/mL). A negative result must be combined with clinical observations, patient history, and epidemiological information. The expected result is Negative.  Fact Sheet for Patients:  PinkCheek.be  Fact Sheet for Healthcare Providers:  GravelBags.it  This test is no t yet approved or cleared by the [place] FDA and  has been authorized for detection and/or diagnosis of SARS-CoV-2 by FDA under an Emergency  Use Authorization (EUA). This EUA will remain  in effect (meaning this test can be used) for the duration of the COVID-19 declaration under Section 564(b)(1) of the Act, 21 U.S.C. section 360bbb-3(b)(1), unless the authorization is terminated or revoked sooner.     Influenza A by PCR NEGATIVE NEGATIVE Final   Influenza B by PCR NEGATIVE NEGATIVE Final    Comment: (NOTE) The Xpert Xpress SARS-CoV-2/FLU/RSV assay is intended as an aid in  the diagnosis of influenza from Nasopharyngeal swab specimens and  should not be used as a sole basis for treatment. Nasal washings and  aspirates are unacceptable for Xpert Xpress SARS-CoV-2/FLU/RSV  testing.  Fact Sheet for Patients: PinkCheek.be  Fact Sheet for Healthcare Providers: GravelBags.it  This test is not yet approved or cleared by the [place] FDA and  has been authorized for detection and/or diagnosis of SARS-CoV-2 by  FDA under an Emergency Use Authorization (EUA). This EUA will remain  in effect (meaning this test can be used) for the duration of the  Covid-19 declaration under Section 564(b)(1) of the Act, 21  U.S.C. section 360bbb-3(b)(1), unless the authorization is  terminated or revoked. Performed at [facility], [place] [address] [place], [place] [address]     Labs: BNP (last 3 results) Recent Labs    06/22/20 1523  BNP 270.6*   Basic Metabolic Panel: Recent Labs  Lab 06/22/20 1523 06/24/20 0234 06/25/20 0333 06/26/20 0115  NA 136 137 138 138  K 4.3 3.4* 4.0 3.7  CL 103 100 105 100  CO2 23 27 24 28   GLUCOSE 101* 85 100* 114*  BUN 21 26* 25* 23  CREATININE 0.91 1.13 1.04 1.17  CALCIUM 8.9 8.6* 8.4* 8.7*  MG  --   --  1.8 1.7  PHOS  --   --  2.9 3.4   Liver Function Tests: Recent Labs  Lab 06/22/20 1523 06/24/20 0234 06/25/20 0333 06/26/20 0115  AST 41 30 27 24   ALT 37 29 24 23   ALKPHOS 136* 123 122 112  BILITOT 0.8 0.8 0.8 0.6   PROT 6.0* 5.7* 5.7* 6.1*  ALBUMIN 3.4* 3.1* 3.0* 3.0*   No results for input(s): LIPASE, AMYLASE in the last 168 hours. No results for input(s): AMMONIA in the last 168 hours. CBC: Recent Labs  Lab 06/22/20 1523 06/24/20 0234 06/25/20 0333 06/26/20 0115  WBC 8.4 9.5 13.5* 13.2*  NEUTROABS 6.0  --  9.1* 8.3*  HGB 9.6* 9.8* 10.1* 10.4*  HCT 29.7* 30.2* 30.2* 32.0*  MCV 94.6 95.0 95.0 95.2  PLT 296 318 362 379   Cardiac Enzymes: No results for input(s): CKTOTAL, CKMB, CKMBINDEX, TROPONINI in the last 168 hours. BNP: Invalid input(s): POCBNP CBG: Recent Labs  Lab  06/24/20 2103  GLUCAP 102*   D-Dimer No results for input(s): DDIMER in the last 72 hours. Hgb A1c No results for input(s): HGBA1C in the last 72 hours. Lipid Profile No results for input(s): CHOL, HDL, LDLCALC, TRIG, CHOLHDL, LDLDIRECT in the last 72 hours. Thyroid function studies No results for input(s): TSH, T4TOTAL, T3FREE, THYROIDAB in the last 72 hours.  Invalid input(s): FREET3 Anemia work up No results for input(s): VITAMINB12, FOLATE, FERRITIN, TIBC, IRON, RETICCTPCT in the last 72 hours. Urinalysis    Component Value Date/Time   COLORURINE YELLOW 06/01/2016 1347   APPEARANCEUR CLOUDY (A) 06/01/2016 1347   APPEARANCEUR Clear 01/20/2016 0959   LABSPEC 1.026 06/01/2016 1347   PHURINE 7.0 06/01/2016 1347   GLUCOSEU NEGATIVE 06/01/2016 1347   HGBUR NEGATIVE 06/01/2016 1347   BILIRUBINUR NEGATIVE 06/01/2016 1347   BILIRUBINUR Negative 01/20/2016 0959   KETONESUR NEGATIVE 06/01/2016 1347   PROTEINUR NEGATIVE 06/01/2016 1347   UROBILINOGEN negative 09/10/2014 1233   UROBILINOGEN 0.2 09/23/2009 2141   NITRITE NEGATIVE 06/01/2016 1347   LEUKOCYTESUR TRACE (A) 06/01/2016 1347   LEUKOCYTESUR Negative 01/20/2016 0959   Sepsis Labs Invalid input(s): PROCALCITONIN,  WBC,  LACTICIDVEN Microbiology Recent Results (from the past 240 hour(s))  Respiratory Panel by RT PCR (Flu A&B, Covid) -  Nasopharyngeal Swab     Status: None   Collection Time: 06/22/20  3:57 PM   Specimen: Nasopharyngeal Swab  Result Value Ref Range Status   SARS Coronavirus 2 by RT PCR NEGATIVE NEGATIVE Final    Comment: (NOTE) SARS-CoV-2 target nucleic acids are NOT DETECTED.  The SARS-CoV-2 RNA is generally detectable in upper respiratoy specimens during the acute phase of infection. The lowest concentration of SARS-CoV-2 viral copies this assay can detect is 131 copies/mL. A negative result does not preclude SARS-Cov-2 infection and should not be used as the sole basis for treatment or other patient management decisions. A negative result may occur with  improper specimen collection/handling, submission of specimen other than nasopharyngeal swab, presence of viral mutation(s) within the areas targeted by this assay, and inadequate number of viral copies (<131 copies/mL). A negative result must be combined with clinical observations, patient history, and epidemiological information. The expected result is Negative.  Fact Sheet for Patients:  PinkCheek.be  Fact Sheet for Healthcare Providers:  GravelBags.it  This test is no t yet approved or cleared by the [place] FDA and  has been authorized for detection and/or diagnosis of SARS-CoV-2 by FDA under an Emergency Use Authorization (EUA). This EUA will remain  in effect (meaning this test can be used) for the duration of the COVID-19 declaration under Section 564(b)(1) of the Act, 21 U.S.C. section 360bbb-3(b)(1), unless the authorization is terminated or revoked sooner.     Influenza A by PCR NEGATIVE NEGATIVE Final   Influenza B by PCR NEGATIVE NEGATIVE Final    Comment: (NOTE) The Xpert Xpress SARS-CoV-2/FLU/RSV assay is intended as an aid in  the diagnosis of influenza from Nasopharyngeal swab specimens and  should not be used as a sole basis for treatment. Nasal washings and   aspirates are unacceptable for Xpert Xpress SARS-CoV-2/FLU/RSV  testing.  Fact Sheet for Patients: PinkCheek.be  Fact Sheet for Healthcare Providers: GravelBags.it  This test is not yet approved or cleared by the [place] FDA and  has been authorized for detection and/or diagnosis of SARS-CoV-2 by  FDA under an Emergency Use Authorization (EUA). This EUA will remain  in effect (meaning this test can be used) for the  duration of the  Covid-19 declaration under Section 564(b)(1) of the Act, 21  U.S.C. section 360bbb-3(b)(1), unless the authorization is  terminated or revoked. Performed at [facility], [place] [address] [place], [place] [address]    Time coordinating discharge: 35 minutes  SIGNED:  Kerney Elbe, DO [facility] 06/26/2020, 5:42 PM Pager is on [place]  If 7PM-7AM, please contact night-coverage [email or web address]

## 2020-06-26 NOTE — Progress Notes (Signed)
PT Progress Note  SATURATION QUALIFICATIONS: (This note is used to comply with regulatory documentation for home oxygen)  Patient Saturations on Room Air at Rest = 97%  Patient Saturations on Room Air while Ambulating = 85%  Patient Saturations on 2 Liters of oxygen while Ambulating = 93%  Please briefly explain why patient needs home oxygen: Pt required 2L supplemental O2 to maintain SpO2 > 90%.  Lorrin Goodell, PT  Office # [phone] Pager [phone]

## 2020-06-26 NOTE — Progress Notes (Signed)
Patient and daughter verbalized understanding of d/c orders d/c home with daughter.

## 2020-06-26 NOTE — Progress Notes (Signed)
NAME:  GARY GABRIELSEN, MRN:  [id number], DOB:  [birth year], LOS: 4 ADMISSION DATE:  06/22/2020, CONSULTATION DATE:  06/25/20 REFERRING MD:  Alfredia Ferguson, CHIEF COMPLAINT:  DOE   Brief History   86 year old man presenting with progressive dyspnea and abnormal CXR.  History of present illness   86 year old man with distant hx of smoking, emphysema presenting with 2 weeks of worsening SOB and DOE.  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 Diffuse ILD  CT chest 10/7 >> centrilobular paraseptal emphysematous change, bronchial wall thickening bilaterally.  Subpleural reticulation with mild basilar prominence, increased compared with 2012.  Focal area of loculated posterior fluid in the major fissure with some associated pleural thickening of unclear significance.  No evidence for groundglass or active inflammation, consolidation.  Possible retained secretions in the posterior trachea  Micro Data:  COVID neg Pct neg  Antimicrobials:  Levaquin 10/4>>   Interim history/subjective:   Breathing is a little bit better I/O- 3.6 L  total Denies cough He does have intermittent aspiration symptoms  Objective   Blood pressure (!) 123/49, pulse 61, temperature 98.5 F (36.9 C), temperature source Oral, resp. rate 20, height 6\' 1"  (1.854 m), weight 79.3 kg, SpO2 93 %.        Intake/Output Summary (Last 24 hours) at 06/26/2020 1037 Last data filed at 06/25/2020 2225 Gross per 24 hour  Intake --  Output 850 ml  Net -850 ml   Filed Weights   06/23/20 1025 06/24/20 0622 06/25/20 0328  Weight: 83.5 kg 80.7 kg 79.3 kg    Examination: Constitutional: Elderly gentleman, sitting up in bed, no distress Ears, nose, mouth, and throat: No stridor, strong voice ne Cardiovascular: Regular, no murmur, distant, trace pretibial edema Respiratory: Bilateral basilar inspiratory crackles, no wheezes Gastro: Nondistended, positive bowel sounds Skin: No rash Neuro: Awake, alert, globally weak but follows commands, moves all extremities.  Ambulating with a walker    Resolved Hospital Problem list     Assessment & Plan:  -Acute on chronic exertional dyspnea, multifactorial including at least in part his underlying known cardiac disease.  Probably also COPD (untreated with bronchodilators as an outpatient). -Newly identified hypoxemia, unclear chronicity -New/progressive interstitial lung disease on CT chest, more prominent than in 2012.  Slow progression.  I do not see any evidence for active inflammatory change, groundglass infiltrate.  Possible etiologies, chronic autoimmune disease, chronic aspiration, possibly medications and eosinophilic pneumonia.  Recommendations: -Agree with DC antibiotics in absence of any clear pneumonia both clinically and by imaging -Follow autoimmune labs for completion -Recommend swallow evaluation.  Suspect that chronic  aspiration is at least in part responsible for his ILD -no evidence acute pneumonitis, acute ILD flare by imaging >> would defer any corticosteroids -diuresis as he can  tolerate -walking oximetry and obtain home O2 for him if he qualifies -would probably benefit from outpt f/u in our office for both the ILD and the COPD. Needs follow up imaging, PFT, possibly BD's going forward >> Dr Vaughan Browner 07/21/20 at 9:30am    Labs   CBC: Recent Labs  Lab 06/22/20 1523 06/24/20 0234 06/25/20 0333 06/26/20 0115  WBC 8.4 9.5 13.5* 13.2*  NEUTROABS 6.0  --  9.1* 8.3*  HGB 9.6* 9.8* 10.1* 10.4*  HCT 29.7* 30.2* 30.2* 32.0*  MCV 94.6 95.0 95.0 95.2  PLT 296 318 362 099    Basic Metabolic Panel: Recent Labs  Lab 06/22/20 1523 06/24/20 0234 06/25/20 0333 06/26/20 0115  NA 136 137 138 138  K 4.3 3.4* 4.0 3.7  CL 103 100 105 100  CO2 23 27 24 28   GLUCOSE 101* 85 100* 114*  BUN 21 26* 25* 23  CREATININE 0.91 1.13 1.04 1.17  CALCIUM 8.9 8.6* 8.4* 8.7*  MG  --   --  1.8 1.7  PHOS  --   --  2.9 3.4   GFR: Estimated Creatinine Clearance: 50.8 mL/min (by C-G formula based on SCr of 1.17 mg/dL). Recent Labs  Lab 06/22/20 1523 06/23/20 0347 06/24/20 0234 06/25/20 0333 06/26/20 0115  PROCALCITON  --  <0.10  --   --   --   WBC 8.4  --  9.5 13.5* 13.2*    Liver Function Tests: Recent Labs  Lab 06/22/20 1523 06/24/20 0234 06/25/20 0333 06/26/20 0115  AST 41 30 27 24   ALT 37 29 24 23   ALKPHOS 136* 123 122 112  BILITOT 0.8 0.8 0.8 0.6  PROT 6.0* 5.7* 5.7* 6.1*  ALBUMIN 3.4* 3.1* 3.0* 3.0*   No results for input(s): LIPASE, AMYLASE in the last 168 hours. No results for input(s): AMMONIA in the last 168 hours.  ABG    Component Value Date/Time   TCO2 24 11/10/2016 0746     Coagulation Profile: No results for input(s): INR, PROTIME in the last 168 hours.  Cardiac Enzymes: No results for input(s): CKTOTAL, CKMB, CKMBINDEX, TROPONINI in the last 168 hours.  HbA1C: Hgb A1c MFr Bld  Date/Time Value Ref Range Status  06/02/2016 04:52 AM 5.0 4.8 - 5.6 % Final    Comment:    (NOTE)         Pre-diabetes: 5.7 - 6.4         Diabetes: >6.4          Glycemic control for adults with diabetes: <7.0   04/27/2016 11:33 AM 5.5 4.8 - 5.6 % Final    Comment:    (NOTE)         Pre-diabetes: 5.7 - 6.4         Diabetes: >6.4         Glycemic control for adults with diabetes: <7.0     CBG: Recent Labs  Lab 06/24/20 2103  GLUCAP 19*     Baltazar Apo, MD, PhD 06/26/2020, 10:51 AM [facility] [phone] or if no answer [phone]

## 2020-06-26 NOTE — TOC Transition Note (Addendum)
Transition of Care [facility]) - CM/SW Discharge Note   Patient Details  Name: ETHANJAMES FONTENOT MRN: [id number] Date of Birth: 1931-12-03  Transition of [facility]) CM/SW Contact:  Joanne Chars, LCSW Phone Number: 06/26/2020, 3:32 PM   Clinical Narrative:   PT will DC today with [facility].  O2 and Rollator from [employer].  No other needs identified.     Final next level of care: Real Barriers to Discharge: Barriers Resolved   Patient Goals and CMS Choice Patient states their goals for this hospitalization and ongoing recovery are:: "be mobile, keep driving" CMS Medicare.gov Compare Post Acute Care list provided to:: Patient Choice offered to / list presented to : Patient  Discharge Placement                       Discharge Plan and Services     Post Acute Care Choice: Home Health          DME Arranged: Walker rolling with seat DME Agency: [employer] Date DME Agency Contacted: 06/25/20 Time DME Agency Contacted: 1300 Representative spoke with at DME Agency: [place]: RN, PT, OT [place] Agency: [place] Date [place]: 06/26/20 Time [place]: 1100 Representative spoke with at [place]: [place] ([place]) Interventions     Readmission Risk Interventions No flowsheet data found.

## 2020-06-26 NOTE — Progress Notes (Signed)
Focus of session on educating pt and daughter at length in energy conservation strategies and fall prevention with 02 tubing. Instructed in benefits of rollator use during ADL.  Pt verbalized understanding.    06/26/20 1500  OT Visit Information  Last OT Received On 06/26/20  Assistance Needed +1  History of Present Illness Pt is an 86 yr old male admitted for CAP with a history of COPD, CHF, CAD status post PCI, hypertension, seizures (managed with medication), AV block requiring PPM, GERD, and history of prostate and bladder cancer. Pt reports stool is always dark due to iron supplement for anemia.  Precautions  Precautions Fall  Pain Assessment  Pain Assessment No/denies pain  Cognition  Arousal/Alertness Awake/alert  Behavior During Therapy WFL for tasks assessed/performed  Overall Cognitive Status Within Functional Limits for tasks assessed  ADL  General ADL Comments Educated pt and daughter in energy conservation strategies and provided handout. Instructed in fall prevention with 02 tubing.  OT - End of Session  Equipment Utilized During Treatment Oxygen  Activity Tolerance Patient tolerated treatment well  Patient left in bed;with call bell/phone within reach  OT Assessment/Plan  OT Plan Discharge plan remains appropriate  OT Visit Diagnosis History of falling (Z91.81);Unsteadiness on feet (R26.81);Repeated falls (R29.6);Muscle weakness (generalized) (M62.81)  OT Frequency (ACUTE ONLY) Min 2X/week  Follow Up Recommendations Home health OT;Supervision/Assistance - 24 hour (initially)  OT Equipment  (Rollator)  AM-PAC OT "6 Clicks" Daily Activity Outcome Measure (Version 2)  Help from another person eating meals? 4  Help from another person taking care of personal grooming? 3  Help from another person toileting, which includes using toliet, bedpan, or urinal? 3  Help from another person bathing (including washing, rinsing, drying)? 3  Help from another person to put on and  taking off regular upper body clothing? 3  Help from another person to put on and taking off regular lower body clothing? 3  6 Click Score 19  OT Goal Progression  Progress towards OT goals Progressing toward goals  Acute Rehab OT Goals  Patient Stated Goal go home  OT Goal Formulation With patient  Time For Goal Achievement 07/09/20  Potential to Achieve Goals Good  OT Time Calculation  OT Start Time (ACUTE ONLY) 1507  OT Stop Time (ACUTE ONLY) 1527  OT Time Calculation (min) 20 min  OT General Charges  $OT Visit 1 Visit  OT Treatments  $Self Care/Home Management  8-22 mins  Nestor Lewandowsky, OTR/L Acute Rehabilitation Services Pager: [phone] Office: [phone]

## 2020-06-26 NOTE — Progress Notes (Signed)
Physical Therapy Treatment Patient Details Name: Joshua [name] MRN: [id number] DOB: 21-Jul-1932 Today's Date: 06/26/2020    History of Present Illness Pt is an 86 yr old male admitted for CAP with a history of COPD, CHF, CAD status post PCI, hypertension, seizures (managed with medication), AV block requiring PPM, GERD, and history of prostate and bladder cancer. Pt reports stool is always dark due to iron supplement for anemia.    PT Comments    Pt received in bed, agreeable and eager to participate in therapy. Pt on 2L on arrival with SpO2 96%. O2 removed. With transition to EOB on RA, SpO2 97%. Desat to 85% on RA during amb. 2L O2 required to recover to 94%. Pt required supervision bed mobility, min assist transfers, and min assist ambulation 30' with RW. Pt currently needing support of 2-wheeled RW but may progress to rollator depending on LOS. Rollator would allow for more independence with portable home O2.    Follow Up Recommendations  Home health PT;Supervision for mobility/OOB     Equipment Recommendations  Other (comment) (home O2, possible rollator)    Recommendations for Other Services       Precautions / Restrictions Precautions Precautions: Fall;Other (comment) Precaution Comments: watch sats    Mobility  Bed Mobility Overal bed mobility: Needs Assistance Bed Mobility: Supine to Sit     Supine to sit: Supervision;HOB elevated     General bed mobility comments: +rail, increased time  Transfers Overall transfer level: Needs assistance Equipment used: Rolling walker (2 wheeled) Transfers: Sit to/from Stand Sit to Stand: Min assist         General transfer comment: cues for hand placement, increased time  Ambulation/Gait Ambulation/Gait assistance: Min assist Gait Distance (Feet): 30 Feet Assistive device: Rolling walker (2 wheeled) Gait Pattern/deviations: Step-through pattern;Decreased stride length;Trunk flexed Gait velocity: decreased   General  Gait Details: assist to power up, increased time to stabilize balance. Required 2L O2 to maintain SpO2 > 90%. Desat to 85% on RA.   Stairs             Wheelchair Mobility    Modified Rankin (Stroke Patients Only)       Balance Overall balance assessment: Needs assistance;History of Falls Sitting-balance support: No upper extremity supported;Feet supported Sitting balance-Leahy Scale: Fair   Postural control: Posterior lean Standing balance support: Bilateral upper extremity supported;During functional activity Standing balance-Leahy Scale: Poor Standing balance comment: reliant on external support                            Cognition Arousal/Alertness: Awake/alert Behavior During Therapy: WFL for tasks assessed/performed Overall Cognitive Status: Within Functional Limits for tasks assessed                                        Exercises      General Comments        Pertinent Vitals/Pain Pain Assessment: Faces Faces Pain Scale: Hurts little more Pain Location: back from laying in bed Pain Descriptors / Indicators: Grimacing;Discomfort Pain Intervention(s): Monitored during session;Repositioned    Home Living                      Prior Function            PT Goals (current goals can now be found in the care plan section) Acute  Rehab PT Goals Patient Stated Goal: go home Progress towards PT goals: Progressing toward goals    Frequency    Min 3X/week      PT Plan Current plan remains appropriate    Co-evaluation              AM-PAC PT "6 Clicks" Mobility   Outcome Measure  Help needed turning from your back to your side while in a flat bed without using bedrails?: A Little Help needed moving from lying on your back to sitting on the side of a flat bed without using bedrails?: A Little Help needed moving to and from a bed to a chair (including a wheelchair)?: A Little Help needed standing up from a  chair using your arms (e.g., wheelchair or bedside chair)?: A Little Help needed to walk in hospital room?: A Little Help needed climbing 3-5 steps with a railing? : A Lot 6 Click Score: 17    End of Session Equipment Utilized During Treatment: Oxygen;Gait belt Activity Tolerance: Patient tolerated treatment well Patient left: Other (comment);with family/visitor present;with call bell/phone within reach (in bathroom) Nurse Communication: Mobility status;Other (comment) (Pt in bathroom.) PT Visit Diagnosis: Other abnormalities of gait and mobility (R26.89);Difficulty in walking, not elsewhere classified (R26.2)     Time: 9417-4081 PT Time Calculation (min) (ACUTE ONLY): 44 min  Charges:  $Gait Training: 38-52 mins                     Lorrin Goodell, [place]  Office # [phone] Pager [phone]    Lorriane Shire 06/26/2020, 11:02 AM

## 2020-06-26 NOTE — Care Management Important Message (Signed)
Important Message  Patient Details  Name: Joshua [name] MRN: [id number] Date of Birth: 1932/09/02   Medicare Important Message Given:  Yes     [name] 06/26/2020, 2:23 PM

## 2020-06-26 NOTE — Plan of Care (Signed)
?  Problem: Clinical Measurements: ?Goal: Ability to maintain a body temperature in the normal range will improve ?Outcome: Progressing ?  ?Problem: Respiratory: ?Goal: Ability to maintain adequate ventilation will improve ?Outcome: Progressing ?  ?Problem: Respiratory: ?Goal: Ability to maintain a clear airway will improve ?Outcome: Progressing ?  ?

## 2020-06-26 NOTE — Progress Notes (Signed)
Modified Barium Swallow Progress Note  Patient Details  Name: MARVELLE CAUDILL MRN: [id number] Date of Birth: 06-02-1932  Today's Date: 06/26/2020  Modified Barium Swallow completed.  Full report located under Chart Review in the Imaging Section.  Brief recommendations include the following:  Clinical Impression  Pt presents with a mild oral and moderate pharyngeal dysphagia of unknown etiology wtih deficits in swallow safety and efficiency; worsened function as compared to MBSS 2017.  Delays in oral bolus propulsion exhibited across POs. Pt with silent aspiration of thin liquids  before the swallow due to reduced timing and efficiency of laryngeal vestibule closure, reduced glottic closure, and diminished sensation. Aspiration of thin liquids was eliminated with use of a chin tuck in 3/3 trials. Incidents of trace transient penetration occasionally occured with use of chin tuck and thin liquids. Deficits also exhibited with reduced laryngeal elevation, decreased base of tongue retraction allowing for mild vallecular and mild lateral channel residuals. Brief 13 mm barium tablet stasis also noted in valleculae, clearing with second reflexive swallow. Recommend thin liquids with strict adherence to chin tuck, regular textures, and medicines in puree. Pt was edcuated extensively regarding results of exam and appropriate compensatory strategies.    Swallow Evaluation Recommendations       SLP Diet Recommendations: Regular solids;Thin liquid   Liquid Administration via: Cup   Medication Administration: Whole meds with puree   Supervision: Patient able to self feed;Full supervision/cueing for compensatory strategies   Compensations: Slow rate;Small sips/bites;Minimize environmental distractions;Clear throat intermittently;Multiple dry swallows after each bite/sip;Chin tuck (chin tuck with all liquids )   Postural Changes: Remain semi-upright after after feeds/meals (Comment);Seated upright at 90  degrees   Oral Care Recommendations: Oral care BID        Shawana Knoch E Thresea Doble MA, CCC-SLP Acute Rehabilitation Services  06/26/2020,1:36 PM

## 2020-06-26 NOTE — Progress Notes (Signed)
Pt with hx of mild pharyngeal dysphagia as evidenced from [facility] 2017, will proceed with repeat MBSS given active PNA and episodic dysphagia per pt report. MBSS to be completed this afternoon.   Brindley Madarang MA, CCC-SLP

## 2020-06-27 LAB — ENA+DNA/DS+ANTICH+CENTRO+JO...
Anti JO-1: <0.2 AI (ref 0.0–0.9)
Centromere Ab Screen: <0.2 AI (ref 0.0–0.9)
Chromatin Ab SerPl-aCnc: <0.2 AI (ref 0.0–0.9)
ENA SM Ab Ser-aCnc: <0.2 AI (ref 0.0–0.9)
Ribonucleic Protein: <0.2 AI (ref 0.0–0.9)
SSA (Ro) (ENA) Antibody, IgG: <0.2 AI (ref 0.0–0.9)
SSB (La) (ENA) Antibody, IgG: <0.2 AI (ref 0.0–0.9)
Scleroderma (Scl-70) (ENA) Antibody, IgG: <0.2 AI (ref 0.0–0.9)
ds DNA Ab: 11 IU/mL — ABNORMAL HIGH (ref 0–9)

## 2020-06-27 LAB — ANA W/REFLEX IF POSITIVE: Anti Nuclear Antibody (ANA): POSITIVE — AB

## 2020-06-28 LAB — RHEUMATOID FACTOR: Rheumatoid fact SerPl-aCnc: <10 IU/mL (ref 0.0–13.9)

## 2020-06-29 ENCOUNTER — Other Ambulatory Visit: Payer: Self-pay | Admitting: Interventional Cardiology

## 2020-06-29 ENCOUNTER — Telehealth: Payer: Self-pay

## 2020-06-29 NOTE — Progress Notes (Signed)
CARDIOLOGY OFFICE NOTE  Date:  07/08/2020    Domingo Dimes Date of Birth: 09/24/1931 Medical Record #[id number]  PCP:  Chevis Pretty, FNP  Cardiologist:  Tamala Julian    Chief Complaint  Patient presents with  . Hospitalization Follow-up    Seen for Dr. Tamala Julian    History of Present Illness: Glenden Rossell is a 86 y.o. male who presents today for a post hospital visit. Seen for Dr. Tamala Julian.   He has a history of a seizure disorder (intracerebral bleed from AV malformation), prostate CAtreated with seed implant, bladder CA,HTN, HLD, GERD, diastolic HF, known CAD with prior STEMI 08/22/2017treated withDES toRCA, AV block requiring PPM in 2018 and replacement in 2019 due to infection.   He was last seen in July by Dr. Tamala Julian - pretty independent - still driving. Noted he has to take his time to avoid significant DOE/chest pain.   I saw him as a work in earlier this month due to worsening DOE and hypoxia - sent to the ER - treated for pneumonia. He was hypoxic. COVID negative. Oxygen was started. He was diuresed. CT was done - concern for fibrotic new/progressive ILD - pulmonary saw him - checking for autoimmune process. Looks to be ANA + - had elevated dsDNAAb as well.  Sed rate was elevated. Was tested for aspiration - recommended to have regular diet with thin liquids. Sent home on oxygen. Has pulmonary follow up in about 2 weeks. Had elevated troponin - felt to be demand ischemia.   Comes in today. Here with his son. He is almost out of oxygen - switched over to one of our tanks. I am the first provider he has seen since discharge. He feels better. Less cough. He is tucking his chin with drinking - this has helped. The oxygen has helped immensely. Seeing pulmonary in 2 weeks. Noted elevated sed rate, + ANA and other marker. He is back on all his medicines. He is sleeping ok. Appetite is ok. Wonders if the oxygen is lifelong.   Past Medical History:  Diagnosis Date  .  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 Arthritis   . Bilateral lower extremity edema   . Bladder tumor   . CAD (coronary artery disease) cardiologist--  dr Daneen Schick   a. 04/27/2016 PCI with DES to RCA with 50% ostial to 60% segmental mid to distal left main, and 90% thrombus filled ostial to proximal circumflex, with distal right coronary filled by collaterals from left-to-right  . COPD with emphysema ([place])   . Dyspnea on exertion   . ED (erectile dysfunction)   . First degree AV block   . GERD (gastroesophageal reflux disease)   . History of adenomatous polyp of colon    09-22-2008  . History of bladder cancer    s/p  resection bladder tumor 04-19-2016  non-invasive low-grade urothelial carcinoma  . History of prostate cancer urologist-  dr Jeffie Pollock-- last PSA 0.02 (summer 2017)   dx 02/ 2013-- Stage T2a, Gleason 7, PSA 1.58--  s/p  radioactive prostate seed implants 01-19-2012  . History of ST elevation myocardial infarction (STEMI)    04-27-2016  . Hyperlipidemia   . Hypertension   . Macular degeneration   . Presence of permanent cardiac pacemaker   . S/P drug eluting coronary stent placement    04-27-2016   . Second degree Mobitz I AV block    occasional w/ first degree  heart block  per cardiologist note by dr Daneen Schick  . Seizures ([place]) per pt son -- no seizure's since 1980   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  arteriorvenous cerebral malformation s/p  craniotomy 1980  . Vitamin D deficiency     Past Surgical History:  Procedure Laterality Date  . APPENDECTOMY  2011  . BLEPHAROPLASTY Bilateral revision 02-17-2016  . CARDIAC CATHETERIZATION N/A 04/27/2016   Procedure: Left Heart Cath and Coronary Angiography;  Surgeon: Belva Crome, MD;  Location: [facility];  Service: Cardiovascular;  Laterality: N/A;  acute inferior wall STEMI ;  ostCx to midCx 90%,  ost1st Mrg to 1st Mrg 50%,  mid to distal RCA  100%,  pRCA 80%,  LVEF 45-50% w/ inferobasal akinesis  . CARDIAC CATHETERIZATION N/A 04/27/2016   Procedure: Temporary Pacemaker;  Surgeon: Belva Crome, MD;  Location: [facility];  Service: Cardiovascular;  Laterality: N/A;  mobitz 2  second degree HB w/ heart rate 29bpm  . CARDIAC CATHETERIZATION N/A 04/27/2016   Procedure: Coronary Stent Intervention;  Surgeon: Belva Crome, MD;  Location: [facility];  Service: Cardiovascular;  Laterality: N/A; DES x1 to  Proximal RCA;  DES x1 to Mid RCA  . CATARACT EXTRACTION W/ INTRAOCULAR LENS  IMPLANT, BILATERAL  2016  approx.  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 CYSTOSCOPY  01/19/2012   Procedure: CYSTOSCOPY;  Surgeon: Malka So, MD;  Location[facility];  Service: Urology;;  no seeds found in bladder  . CYSTOSCOPY W/ RETROGRADES Bilateral 04/19/2016   Procedure: CYSTOSCOPY WITH BILATERAL RETROGRADE PYELOGRAM TRANSURETHRAL RESECTION OF BLADDER TUMOR ;  Surgeon: [name], MD;  Location: [facility];  Service: Urology;  Laterality: Bilateral;  . CYSTOSCOPY WITH BIOPSY N/A 11/10/2016   Procedure: CYSTOSCOPY WITH BIOPSY AND FULGURATION;  Surgeon: [name], MD;  Location: [facility];  Service: Urology;  Laterality: N/A;  . INSERT / REPLACE / REMOVE PACEMAKER  06/15/2017  . LEAD EXTRACTION N/A 01/22/2018   Procedure: LEAD EXTRACTION;  Surgeon: Evans Lance, MD;  Location: [facility];  Service: Cardiovascular;  Laterality: N/A;  . PACEMAKER IMPLANT N/A 06/15/2017   Procedure: Pacemaker Implant;  Surgeon: [name], MD;  Location: [facility];  Service: Cardiovascular;  Laterality: N/A;  . PACEMAKER IMPLANT N/A 01/25/2018   Procedure: PACEMAKER IMPLANT;  Surgeon: Evans Lance, MD;  Location: [facility];  Service: Cardiovascular;  Laterality: N/A;  . PPM GENERATOR REMOVAL N/A 01/22/2018   Procedure: PPM GENERATOR REMOVAL;  Surgeon:  Evans Lance, MD;  Location: [facility];  Service: Cardiovascular;  Laterality: N/A;  . RADIOACTIVE SEED IMPLANT  01/19/2012   Procedure: RADIOACTIVE SEED IMPLANT;  Surgeon: Malka So, MD;  Location: [facility];  Service: Urology;  Laterality: N/A;  68 seeds implanted  . TRANSTHORACIC ECHOCARDIOGRAM  06/02/2016   ef 60-65%/  trivial MR/  mild TR     Medications: Current Meds  Medication Sig  . aspirin EC 81 MG tablet Take 81 mg by mouth daily. Swallow whole.  Marland Kitchen atorvastatin (LIPITOR) 20 MG tablet Take 1 tablet (20 mg total) by mouth daily.  . calcium carbonate (OSCAL) 1500 (600 Ca) MG TABS tablet Take 600 mg of elemental calcium by mouth daily with breakfast.  . Cholecalciferol (VITAMIN D3) 50 MCG (2000 UT) capsule Take 2,000 Units by mouth daily.   Marland Kitchen  citalopram (CELEXA) 20 MG tablet Take 1 tablet (20 mg total) by mouth daily.  . clopidogrel (PLAVIX) 75 MG tablet Take 1 tablet (75 mg total) by mouth daily.  . famotidine (PEPCID) 20 MG tablet Take 20 mg by mouth daily.   . ferrous sulfate 325 (65 FE) MG tablet Take 1 tablet (325 mg total) by mouth daily with breakfast.  . furosemide (LASIX) 40 MG tablet Take 0.5 tablets (20 mg total) by mouth daily.  Marland Kitchen MAGNESIUM PO Take 1 tablet by mouth at bedtime.  . Multiple Vitamins-Minerals (PRESERVISION AREDS 2) CAPS Take 1 capsule by mouth 2 (two) times daily.   Marland Kitchen PHENobarbital (LUMINAL) 64.8 MG tablet Take 1 tablet (64.8 mg total) by mouth 2 (two) times daily.  . phenytoin (DILANTIN) 100 MG ER capsule TAKE (2) CAPSULES TWICE DAILY.  Marland Kitchen polyvinyl alcohol (LIQUIFILM TEARS) 1.4 % ophthalmic solution Place 2 drops into both eyes daily as needed for dry eyes.   Marland Kitchen spironolactone (ALDACTONE) 25 MG tablet Take 1 tablet (25 mg total) by mouth daily. (Needs to be seen before next refill)     Allergies: Allergies  Allergen Reactions  . Penicillins Rash    Has patient had a PCN reaction causing immediate rash,  facial/tongue/throat swelling, SOB or lightheadedness with hypotension: ##Yes## Has patient had a PCN reaction causing severe rash involving mucus membranes or skin necrosis: No Has patient had a PCN reaction that required hospitalization No Has patient had a PCN reaction occurring within the last 10 years: No If all of the above answers are "NO", then may proceed with Cephalosporin use.     Social History: The patient  reports that he quit smoking about 47 years ago. He has a 30.00 pack-year smoking history. He has never used smokeless tobacco. He reports current alcohol use. He reports that he does not use drugs.   Family History: The patient's family history includes Emphysema in his father and mother; Stroke in his mother.   Review of Systems: Please see the history of present illness.   All other systems are reviewed and negative.   Physical Exam: VS:  BP 130/70   Pulse 60   Ht 6' (1.829 m)   Wt 174 lb (78.9 kg)   SpO2 96%   BMI 23.60 kg/m  .  BMI Body mass index is 23.6 kg/m.  Wt Readings from Last 3 Encounters:  07/08/20 174 lb (78.9 kg)  06/25/20 174 lb 13.2 oz (79.3 kg)  06/22/20 184 lb (83.5 kg)    General: Elderly. Looks chronically ill but better than when I last saw him. Has oxygen in place.  His weight is down 10 pounds.  Cardiac: Regular rate and rhythm. No significant edema.  Respiratory:  He has fine crackles in the bases of both lungs. He has oxygen in place.  GI: Soft and nontender.  MS: No deformity or atrophy. Gait and [name] intact. Using a walker.  Skin: Warm and dry. Color is still somewhat pale.  Neuro:  Strength and sensation are intact and no gross focal deficits noted.  Psych: Alert, appropriate and with normal affect.   LABORATORY DATA:  EKG:  EKG is not ordered today.    Lab Results  Component Value Date   WBC 13.2 (H) 06/26/2020   HGB 10.4 (L) 06/26/2020   HCT 32.0 (L) 06/26/2020   PLT 379 06/26/2020   GLUCOSE 114 (H) 06/26/2020   CHOL  142 11/25/2019   TRIG 83 11/25/2019   HDL 54 11/25/2019  LDLCALC 72 11/25/2019   ALT 23 06/26/2020   AST 24 06/26/2020   NA 138 06/26/2020   K 3.7 06/26/2020   CL 100 06/26/2020   CREATININE 1.17 06/26/2020   BUN 23 06/26/2020   CO2 28 06/26/2020   TSH 2.800 07/19/2019   PSA <0.1 10/21/2014   INR 1.15 04/27/2016   HGBA1C 5.0 06/02/2016     BNP (last 3 results) Recent Labs    06/22/20 1523  BNP 700.2*    ProBNP (last 3 results) Recent Labs    08/21/19 1638  PROBNP 424     Other Studies Reviewed Today:  ECHO IMPRESSIONS 06/2020  1. Left ventricular ejection fraction, by estimation, is 50 to 55%. The  left ventricle has low normal function. The left ventricle has no regional  wall motion abnormalities. There is mild concentric left ventricular  hypertrophy. Left ventricular  diastolic parameters are consistent with Grade I diastolic dysfunction  (impaired relaxation).  2. Right ventricular systolic function is mildly reduced. The right  ventricular size is normal. There is moderately elevated pulmonary artery  systolic pressure.  3. Right atrial size was mildly dilated.  4. The mitral valve is normal in structure. Mild mitral valve  regurgitation.  5. The aortic valve is tricuspid. There is mild calcification of the  aortic valve. There is mild thickening of the aortic valve. Aortic valve  regurgitation is not visualized.  6. Aortic dilatation noted. There is mild dilatation of the aortic root,  measuring 37 mm.  7. The inferior vena cava is dilated in size with >50% respiratory  variability, suggesting right atrial pressure of 8 mmHg.    Assessment/Plan:  1. Marked hypoxia with recent bouth of respiratory failure/CAP - on oxygen - looks to have possible ILD/autoimmune process - to see pulmonary in about 2 weeks. Will recheck his lab today. Very grateful for my help earlier this month.   2. Acute on chronic diastolic HF - weight is back down - less  dyspnea - BP is ok.   3. Known CAD - remote PCI - had elevated troponins at recent admission but flat and felt to be demand ischemia - he has no chest pain - would favor continued medical therapy.   4. Underlying PPM - followed by EP  5. Iron deficiency anemia - rechecking lab today.   6. HLD - on statin therapy.   7. Seizure disorder - not discussed.   8. Leukocytosis - rechecking lab today.   9. HTN - BP is good - no changes with his current regimen.   Current medicines are reviewed with the patient today.  The patient does not have concerns regarding medicines other than what has been noted above.  The following changes have been made:  See above.  Labs/ tests ordered today include:    Orders Placed This Encounter  Procedures  . Comprehensive metabolic panel  . CBC with Differential/Platelet  . Sedimentation rate     Disposition:   FU with Dr. Tamala Julian in 2 months.    Patient is agreeable to this plan and will call if any problems develop in the interim.   SignedTruitt Merle, NP  07/08/2020 2:54 PM  [facility] [address] [place], [place]  [address] Phone: [phone] Fax: [phone]

## 2020-06-29 NOTE — Telephone Encounter (Signed)
Called son - he was requesting a list of caregivers that are local

## 2020-06-30 LAB — IGE: IgE (Immunoglobulin E), Serum: 340 IU/mL (ref 6–495)

## 2020-07-01 ENCOUNTER — Other Ambulatory Visit: Payer: Self-pay | Admitting: *Deleted

## 2020-07-01 DIAGNOSIS — G40909 Epilepsy, unspecified, not intractable, without status epilepticus: Secondary | ICD-10-CM | POA: Diagnosis not present

## 2020-07-01 DIAGNOSIS — Z7982 Long term (current) use of aspirin: Secondary | ICD-10-CM | POA: Diagnosis not present

## 2020-07-01 DIAGNOSIS — E876 Hypokalemia: Secondary | ICD-10-CM | POA: Diagnosis not present

## 2020-07-01 DIAGNOSIS — K219 Gastro-esophageal reflux disease without esophagitis: Secondary | ICD-10-CM | POA: Diagnosis not present

## 2020-07-01 DIAGNOSIS — J9601 Acute respiratory failure with hypoxia: Secondary | ICD-10-CM | POA: Diagnosis not present

## 2020-07-01 DIAGNOSIS — E7800 Pure hypercholesterolemia, unspecified: Secondary | ICD-10-CM | POA: Diagnosis not present

## 2020-07-01 DIAGNOSIS — J8410 Pulmonary fibrosis, unspecified: Secondary | ICD-10-CM | POA: Diagnosis not present

## 2020-07-01 DIAGNOSIS — I700 Atherosclerosis of aorta: Secondary | ICD-10-CM | POA: Diagnosis not present

## 2020-07-01 DIAGNOSIS — I088 Other rheumatic multiple valve diseases: Secondary | ICD-10-CM | POA: Diagnosis not present

## 2020-07-01 DIAGNOSIS — Z7902 Long term (current) use of antithrombotics/antiplatelets: Secondary | ICD-10-CM | POA: Diagnosis not present

## 2020-07-01 DIAGNOSIS — I0981 Rheumatic heart failure: Secondary | ICD-10-CM | POA: Diagnosis not present

## 2020-07-01 DIAGNOSIS — R1312 Dysphagia, oropharyngeal phase: Secondary | ICD-10-CM | POA: Diagnosis not present

## 2020-07-01 DIAGNOSIS — I5033 Acute on chronic diastolic (congestive) heart failure: Secondary | ICD-10-CM | POA: Diagnosis not present

## 2020-07-01 DIAGNOSIS — J432 Centrilobular emphysema: Secondary | ICD-10-CM | POA: Diagnosis not present

## 2020-07-01 DIAGNOSIS — I441 Atrioventricular block, second degree: Secondary | ICD-10-CM | POA: Diagnosis not present

## 2020-07-01 DIAGNOSIS — D72829 Elevated white blood cell count, unspecified: Secondary | ICD-10-CM | POA: Diagnosis not present

## 2020-07-01 DIAGNOSIS — I2510 Atherosclerotic heart disease of native coronary artery without angina pectoris: Secondary | ICD-10-CM | POA: Diagnosis not present

## 2020-07-01 DIAGNOSIS — I10 Essential (primary) hypertension: Secondary | ICD-10-CM | POA: Diagnosis not present

## 2020-07-01 DIAGNOSIS — D649 Anemia, unspecified: Secondary | ICD-10-CM | POA: Diagnosis not present

## 2020-07-01 DIAGNOSIS — I252 Old myocardial infarction: Secondary | ICD-10-CM | POA: Diagnosis not present

## 2020-07-01 DIAGNOSIS — E785 Hyperlipidemia, unspecified: Secondary | ICD-10-CM | POA: Diagnosis not present

## 2020-07-01 DIAGNOSIS — H3530 Unspecified macular degeneration: Secondary | ICD-10-CM | POA: Diagnosis not present

## 2020-07-01 DIAGNOSIS — Z9981 Dependence on supplemental oxygen: Secondary | ICD-10-CM | POA: Diagnosis not present

## 2020-07-01 DIAGNOSIS — J479 Bronchiectasis, uncomplicated: Secondary | ICD-10-CM | POA: Diagnosis not present

## 2020-07-01 DIAGNOSIS — K8020 Calculus of gallbladder without cholecystitis without obstruction: Secondary | ICD-10-CM | POA: Diagnosis not present

## 2020-07-01 DIAGNOSIS — I110 Hypertensive heart disease with heart failure: Secondary | ICD-10-CM | POA: Diagnosis not present

## 2020-07-01 MED ORDER — ATORVASTATIN CALCIUM 20 MG PO TABS: 20.0000 mg | ORAL_TABLET | Freq: Every day | ORAL | 0 refills | Status: DC

## 2020-07-01 MED ORDER — PHENOBARBITAL 64.8 MG PO TABS: 64.8000 mg | ORAL_TABLET | Freq: Two times a day (BID) | ORAL | 0 refills | Status: AC

## 2020-07-02 ENCOUNTER — Other Ambulatory Visit: Payer: Self-pay | Admitting: *Deleted

## 2020-07-02 DIAGNOSIS — Z961 Presence of intraocular lens: Secondary | ICD-10-CM | POA: Diagnosis not present

## 2020-07-02 DIAGNOSIS — H353231 Exudative age-related macular degeneration, bilateral, with active choroidal neovascularization: Secondary | ICD-10-CM | POA: Diagnosis not present

## 2020-07-02 DIAGNOSIS — Z9842 Cataract extraction status, left eye: Secondary | ICD-10-CM | POA: Diagnosis not present

## 2020-07-02 DIAGNOSIS — Z9841 Cataract extraction status, right eye: Secondary | ICD-10-CM | POA: Diagnosis not present

## 2020-07-02 DIAGNOSIS — H04123 Dry eye syndrome of bilateral lacrimal glands: Secondary | ICD-10-CM | POA: Diagnosis not present

## 2020-07-02 MED ORDER — SPIRONOLACTONE 25 MG PO TABS
25.0000 mg | ORAL_TABLET | Freq: Every day | ORAL | 0 refills | Status: DC
Start: 2020-07-02 — End: 2020-07-06

## 2020-07-02 NOTE — Telephone Encounter (Signed)
Fax from [employer] RF mail order late, will RF 30 days with note NTBS, pt cancelled last months appt

## 2020-07-03 ENCOUNTER — Other Ambulatory Visit: Payer: Self-pay | Admitting: *Deleted

## 2020-07-06 ENCOUNTER — Telehealth: Payer: Self-pay

## 2020-07-06 ENCOUNTER — Other Ambulatory Visit: Payer: Self-pay | Admitting: *Deleted

## 2020-07-06 MED ORDER — SPIRONOLACTONE 25 MG PO TABS: 25.0000 mg | ORAL_TABLET | Freq: Every day | ORAL | 0 refills | Status: DC

## 2020-07-06 NOTE — Telephone Encounter (Signed)
Refill sent to pharmacy.   

## 2020-07-06 NOTE — Telephone Encounter (Signed)
  Prescription Request  07/06/2020  What is the name of the medication or equipment? spironolactone (ALDACTONE) 25 MG tablet   Have you contacted your pharmacy to request a refill? (if applicable) yes  Which pharmacy would you like this sent to? [facility]   Patient notified that their request is being sent to the clinical staff for review and that they should receive a response within 2 business days.

## 2020-07-07 ENCOUNTER — Telehealth: Payer: Self-pay

## 2020-07-07 DIAGNOSIS — I0981 Rheumatic heart failure: Secondary | ICD-10-CM | POA: Diagnosis not present

## 2020-07-07 DIAGNOSIS — I088 Other rheumatic multiple valve diseases: Secondary | ICD-10-CM | POA: Diagnosis not present

## 2020-07-07 DIAGNOSIS — I110 Hypertensive heart disease with heart failure: Secondary | ICD-10-CM | POA: Diagnosis not present

## 2020-07-07 DIAGNOSIS — J479 Bronchiectasis, uncomplicated: Secondary | ICD-10-CM | POA: Diagnosis not present

## 2020-07-07 DIAGNOSIS — I5033 Acute on chronic diastolic (congestive) heart failure: Secondary | ICD-10-CM | POA: Diagnosis not present

## 2020-07-07 DIAGNOSIS — J432 Centrilobular emphysema: Secondary | ICD-10-CM | POA: Diagnosis not present

## 2020-07-07 MED ORDER — SPIRONOLACTONE 25 MG PO TABS
25.0000 mg | ORAL_TABLET | Freq: Every day | ORAL | 3 refills | Status: DC
Start: 2020-07-07 — End: 2020-08-31

## 2020-07-07 MED ORDER — PHENYTOIN SODIUM EXTENDED 100 MG PO CAPS: ORAL_CAPSULE | ORAL | 3 refills | Status: DC

## 2020-07-07 MED ORDER — CLOPIDOGREL BISULFATE 75 MG PO TABS
75.0000 mg | ORAL_TABLET | Freq: Every day | ORAL | 3 refills | Status: DC
Start: 2020-07-07 — End: 2020-08-20

## 2020-07-07 NOTE — Telephone Encounter (Signed)
VO given for Phenobarbital that was not received by [facility] electronically  LMOVM to patient that refills have been called into pharmacy, also to call the office and make 6 mos fu appt with Ronnald Collum

## 2020-07-07 NOTE — Telephone Encounter (Signed)
[facility] needs new RX for  Spironolactone 25 mg. tablet

## 2020-07-07 NOTE — Telephone Encounter (Signed)
Pt called requesting to speak with Georgina Pillion when she is available. It is regarding his medicines.

## 2020-07-07 NOTE — Telephone Encounter (Signed)
VO given for Spironalactone, Atorvastatin & Citalopram, all that were not received electronically

## 2020-07-07 NOTE — Telephone Encounter (Signed)
Pt called - refills fixed and [facility] to [facility]

## 2020-07-08 ENCOUNTER — Ambulatory Visit (INDEPENDENT_AMBULATORY_CARE_PROVIDER_SITE_OTHER): Payer: Medicare Other | Admitting: Nurse Practitioner

## 2020-07-08 ENCOUNTER — Other Ambulatory Visit: Payer: Self-pay

## 2020-07-08 ENCOUNTER — Telehealth: Payer: Self-pay | Admitting: Pulmonary Disease

## 2020-07-08 ENCOUNTER — Encounter: Payer: Self-pay | Admitting: Nurse Practitioner

## 2020-07-08 VITALS — BP 130/70 | HR 60 | Ht 72.0 in | Wt 174.0 lb

## 2020-07-08 DIAGNOSIS — Z950 Presence of cardiac pacemaker: Secondary | ICD-10-CM | POA: Diagnosis not present

## 2020-07-08 DIAGNOSIS — I10 Essential (primary) hypertension: Secondary | ICD-10-CM | POA: Diagnosis not present

## 2020-07-08 DIAGNOSIS — I5032 Chronic diastolic (congestive) heart failure: Secondary | ICD-10-CM

## 2020-07-08 DIAGNOSIS — I25118 Atherosclerotic heart disease of native coronary artery with other forms of angina pectoris: Secondary | ICD-10-CM

## 2020-07-08 DIAGNOSIS — E7800 Pure hypercholesterolemia, unspecified: Secondary | ICD-10-CM

## 2020-07-08 DIAGNOSIS — R0602 Shortness of breath: Secondary | ICD-10-CM | POA: Diagnosis not present

## 2020-07-08 NOTE — Patient Instructions (Signed)
After Visit Summary:  We will be checking the following labs today - CMET, CBC with diff, and sed rate   Medication Instructions:    Continue with your current medicines.    If you need a refill on your cardiac medications before your next appointment, please call your pharmacy.     Testing/Procedures To Be Arranged:  N/A  Follow-Up:   See Dr. Tamala Julian in about 2 months for recheck    At [facility], you and your health needs are our priority.  As part of our continuing mission to provide you with exceptional heart care, we have created designated Provider Care Teams.  These Care Teams include your primary Cardiologist (physician) and Advanced Practice Providers (APPs -  Physician Assistants and Nurse Practitioners) who all work together to provide you with the care you need, when you need it.  Special Instructions:  . Stay safe, wash your hands for at least 20 seconds and wear a mask when needed.  . It was good to talk with you both today.    Call the [place] office at [phone] if you have any questions, problems or concerns.

## 2020-07-08 NOTE — Telephone Encounter (Signed)
Spoke to patient's son, [name] ([place]). [name] is requesting order for POC.  Patient has been seen in our office previously. He has upcoming appt on 07/21/2020. [name] is aware that this matter can be addressed during patient's upcoming visit, as patient will need a walk test.  [name] voiced his understanding and had no further questions.  Nothing further needed.

## 2020-07-09 ENCOUNTER — Other Ambulatory Visit: Payer: Self-pay | Admitting: *Deleted

## 2020-07-09 DIAGNOSIS — I110 Hypertensive heart disease with heart failure: Secondary | ICD-10-CM | POA: Diagnosis not present

## 2020-07-09 DIAGNOSIS — I5033 Acute on chronic diastolic (congestive) heart failure: Secondary | ICD-10-CM | POA: Diagnosis not present

## 2020-07-09 DIAGNOSIS — I0981 Rheumatic heart failure: Secondary | ICD-10-CM | POA: Diagnosis not present

## 2020-07-09 DIAGNOSIS — J432 Centrilobular emphysema: Secondary | ICD-10-CM | POA: Diagnosis not present

## 2020-07-09 DIAGNOSIS — I088 Other rheumatic multiple valve diseases: Secondary | ICD-10-CM | POA: Diagnosis not present

## 2020-07-09 DIAGNOSIS — J479 Bronchiectasis, uncomplicated: Secondary | ICD-10-CM | POA: Diagnosis not present

## 2020-07-09 LAB — CBC WITH DIFFERENTIAL/PLATELET
Basophils Absolute: 0.2 10*3/uL (ref 0.0–0.2)
Basos: 2 %
EOS (ABSOLUTE): 0.6 10*3/uL — ABNORMAL HIGH (ref 0.0–0.4)
Eos: 5 %
Hematocrit: 31.9 % — ABNORMAL LOW (ref 37.5–51.0)
Hemoglobin: 11 g/dL — ABNORMAL LOW (ref 13.0–17.7)
Immature Grans (Abs): 0 10*3/uL (ref 0.0–0.1)
Immature Granulocytes: 0 %
Lymphocytes Absolute: 2.1 10*3/uL (ref 0.7–3.1)
Lymphs: 18 %
MCH: 31 pg (ref 26.6–33.0)
MCHC: 34.5 g/dL (ref 31.5–35.7)
MCV: 90 fL (ref 79–97)
Monocytes Absolute: 1.5 10*3/uL — ABNORMAL HIGH (ref 0.1–0.9)
Monocytes: 13 %
Neutrophils Absolute: 7.1 10*3/uL — ABNORMAL HIGH (ref 1.4–7.0)
Neutrophils: 62 %
Platelets: 450 10*3/uL (ref 150–450)
RBC: 3.55 x10E6/uL — ABNORMAL LOW (ref 4.14–5.80)
RDW: 15.5 % — ABNORMAL HIGH (ref 11.6–15.4)
WBC: 11.5 10*3/uL — ABNORMAL HIGH (ref 3.4–10.8)

## 2020-07-09 LAB — COMPREHENSIVE METABOLIC PANEL
ALT: 30 IU/L (ref 0–44)
AST: 30 IU/L (ref 0–40)
Albumin/Globulin Ratio: 1.4 (ref 1.2–2.2)
Albumin: 3.9 g/dL (ref 3.6–4.6)
Alkaline Phosphatase: 146 IU/L — ABNORMAL HIGH (ref 44–121)
BUN/Creatinine Ratio: 17 (ref 10–24)
BUN: 17 mg/dL (ref 8–27)
Bilirubin Total: 0.3 mg/dL (ref 0.0–1.2)
CO2: 24 mmol/L (ref 20–29)
Calcium: 9.6 mg/dL (ref 8.6–10.2)
Chloride: 101 mmol/L (ref 96–106)
Creatinine, Ser: 1.01 mg/dL (ref 0.76–1.27)
GFR calc Af Amer: 77 mL/min/{1.73_m2} (ref >59)
GFR calc non Af Amer: 67 mL/min/{1.73_m2} (ref >59)
Globulin, Total: 2.8 g/dL (ref 1.5–4.5)
Glucose: 80 mg/dL (ref 65–99)
Potassium: 4.5 mmol/L (ref 3.5–5.2)
Sodium: 138 mmol/L (ref 134–144)
Total Protein: 6.7 g/dL (ref 6.0–8.5)

## 2020-07-09 LAB — SEDIMENTATION RATE: Sed Rate: 13 mm/hr (ref 0–30)

## 2020-07-09 MED ORDER — CITALOPRAM HYDROBROMIDE 20 MG PO TABS
20.0000 mg | ORAL_TABLET | Freq: Every day | ORAL | 1 refills | Status: DC
Start: 2020-07-09 — End: 2020-08-31

## 2020-07-10 DIAGNOSIS — J432 Centrilobular emphysema: Secondary | ICD-10-CM | POA: Diagnosis not present

## 2020-07-10 DIAGNOSIS — I110 Hypertensive heart disease with heart failure: Secondary | ICD-10-CM | POA: Diagnosis not present

## 2020-07-10 DIAGNOSIS — J479 Bronchiectasis, uncomplicated: Secondary | ICD-10-CM | POA: Diagnosis not present

## 2020-07-10 DIAGNOSIS — I5033 Acute on chronic diastolic (congestive) heart failure: Secondary | ICD-10-CM | POA: Diagnosis not present

## 2020-07-10 DIAGNOSIS — I0981 Rheumatic heart failure: Secondary | ICD-10-CM | POA: Diagnosis not present

## 2020-07-10 DIAGNOSIS — I088 Other rheumatic multiple valve diseases: Secondary | ICD-10-CM | POA: Diagnosis not present

## 2020-07-13 DIAGNOSIS — I0981 Rheumatic heart failure: Secondary | ICD-10-CM | POA: Diagnosis not present

## 2020-07-13 DIAGNOSIS — I5033 Acute on chronic diastolic (congestive) heart failure: Secondary | ICD-10-CM | POA: Diagnosis not present

## 2020-07-13 DIAGNOSIS — I088 Other rheumatic multiple valve diseases: Secondary | ICD-10-CM | POA: Diagnosis not present

## 2020-07-13 DIAGNOSIS — J432 Centrilobular emphysema: Secondary | ICD-10-CM | POA: Diagnosis not present

## 2020-07-13 DIAGNOSIS — I110 Hypertensive heart disease with heart failure: Secondary | ICD-10-CM | POA: Diagnosis not present

## 2020-07-13 DIAGNOSIS — J479 Bronchiectasis, uncomplicated: Secondary | ICD-10-CM | POA: Diagnosis not present

## 2020-07-14 DIAGNOSIS — I0981 Rheumatic heart failure: Secondary | ICD-10-CM | POA: Diagnosis not present

## 2020-07-14 DIAGNOSIS — I5033 Acute on chronic diastolic (congestive) heart failure: Secondary | ICD-10-CM | POA: Diagnosis not present

## 2020-07-14 DIAGNOSIS — I110 Hypertensive heart disease with heart failure: Secondary | ICD-10-CM | POA: Diagnosis not present

## 2020-07-14 DIAGNOSIS — J432 Centrilobular emphysema: Secondary | ICD-10-CM | POA: Diagnosis not present

## 2020-07-14 DIAGNOSIS — I088 Other rheumatic multiple valve diseases: Secondary | ICD-10-CM | POA: Diagnosis not present

## 2020-07-14 DIAGNOSIS — J479 Bronchiectasis, uncomplicated: Secondary | ICD-10-CM | POA: Diagnosis not present

## 2020-07-15 DIAGNOSIS — I110 Hypertensive heart disease with heart failure: Secondary | ICD-10-CM | POA: Diagnosis not present

## 2020-07-15 DIAGNOSIS — J479 Bronchiectasis, uncomplicated: Secondary | ICD-10-CM | POA: Diagnosis not present

## 2020-07-15 DIAGNOSIS — I5033 Acute on chronic diastolic (congestive) heart failure: Secondary | ICD-10-CM | POA: Diagnosis not present

## 2020-07-15 DIAGNOSIS — I0981 Rheumatic heart failure: Secondary | ICD-10-CM | POA: Diagnosis not present

## 2020-07-15 DIAGNOSIS — I088 Other rheumatic multiple valve diseases: Secondary | ICD-10-CM | POA: Diagnosis not present

## 2020-07-15 DIAGNOSIS — J432 Centrilobular emphysema: Secondary | ICD-10-CM | POA: Diagnosis not present

## 2020-07-16 DIAGNOSIS — I088 Other rheumatic multiple valve diseases: Secondary | ICD-10-CM | POA: Diagnosis not present

## 2020-07-16 DIAGNOSIS — J432 Centrilobular emphysema: Secondary | ICD-10-CM | POA: Diagnosis not present

## 2020-07-16 DIAGNOSIS — I5033 Acute on chronic diastolic (congestive) heart failure: Secondary | ICD-10-CM | POA: Diagnosis not present

## 2020-07-16 DIAGNOSIS — I0981 Rheumatic heart failure: Secondary | ICD-10-CM | POA: Diagnosis not present

## 2020-07-16 DIAGNOSIS — J479 Bronchiectasis, uncomplicated: Secondary | ICD-10-CM | POA: Diagnosis not present

## 2020-07-16 DIAGNOSIS — I110 Hypertensive heart disease with heart failure: Secondary | ICD-10-CM | POA: Diagnosis not present

## 2020-07-17 ENCOUNTER — Ambulatory Visit (INDEPENDENT_AMBULATORY_CARE_PROVIDER_SITE_OTHER): Payer: Medicare Other | Admitting: Nurse Practitioner

## 2020-07-17 ENCOUNTER — Other Ambulatory Visit: Payer: Self-pay

## 2020-07-17 ENCOUNTER — Encounter: Payer: Self-pay | Admitting: Nurse Practitioner

## 2020-07-17 VITALS — BP 140/62 | HR 59 | Temp 97.9°F | Resp 20 | Ht 72.0 in | Wt 171.0 lb

## 2020-07-17 DIAGNOSIS — J432 Centrilobular emphysema: Secondary | ICD-10-CM | POA: Diagnosis not present

## 2020-07-17 DIAGNOSIS — I5033 Acute on chronic diastolic (congestive) heart failure: Secondary | ICD-10-CM | POA: Diagnosis not present

## 2020-07-17 DIAGNOSIS — I25118 Atherosclerotic heart disease of native coronary artery with other forms of angina pectoris: Secondary | ICD-10-CM

## 2020-07-17 DIAGNOSIS — I0981 Rheumatic heart failure: Secondary | ICD-10-CM | POA: Diagnosis not present

## 2020-07-17 DIAGNOSIS — Z09 Encounter for follow-up examination after completed treatment for conditions other than malignant neoplasm: Secondary | ICD-10-CM | POA: Diagnosis not present

## 2020-07-17 DIAGNOSIS — I088 Other rheumatic multiple valve diseases: Secondary | ICD-10-CM | POA: Diagnosis not present

## 2020-07-17 DIAGNOSIS — I110 Hypertensive heart disease with heart failure: Secondary | ICD-10-CM | POA: Diagnosis not present

## 2020-07-17 DIAGNOSIS — J189 Pneumonia, unspecified organism: Secondary | ICD-10-CM | POA: Diagnosis not present

## 2020-07-17 DIAGNOSIS — J479 Bronchiectasis, uncomplicated: Secondary | ICD-10-CM | POA: Diagnosis not present

## 2020-07-17 DIAGNOSIS — I5032 Chronic diastolic (congestive) heart failure: Secondary | ICD-10-CM | POA: Diagnosis not present

## 2020-07-17 NOTE — Patient Instructions (Signed)

## 2020-07-17 NOTE — Progress Notes (Signed)
° °  Subjective:    Patient ID: Joshua [name], male    DOB: September 25, 1931, 86 y.o.   MRN: [id number]   Chief Complaint: Hospitalization Follow-up   HPI Patient was admitted to the hospital on 06/22/20 and was dx with pneumonia and CHF. During hospital course he was give supplemental oxygen and IV antibiotics. He was discharged home on 06/26/20. He is here today for follow up and repeat labs. Since his discharge he says he is feeling much better. He is now on oxygen at home 24/7. They gave him lasix to take at hom efor 15 days and that has expired. He denies any SOB or edema.   Review of Systems  Constitutional: Negative.   Respiratory: Positive for shortness of breath.   Cardiovascular: Negative for chest pain, palpitations and leg swelling.  Gastrointestinal: Negative.   Genitourinary: Negative.   Neurological: Negative.   Psychiatric/Behavioral: Negative.   All other systems reviewed and are negative.      Objective:   Physical Exam Vitals and nursing note reviewed.  Constitutional:      Appearance: Normal appearance.  Cardiovascular:     Rate and Rhythm: Normal rate and regular rhythm.     Heart sounds: Normal heart sounds.  Pulmonary:     Effort: Pulmonary effort is normal.     Breath sounds: Rales (bil lower lobes) present.     Comments: o2 via nasal canulla Skin:    General: Skin is warm.  Neurological:     General: No focal deficit present.     Mental Status: He is alert and oriented to person, place, and time.    BP 140/62    Pulse (!) 59    Temp 97.9 F (36.6 C) (Temporal)    Resp 20    Ht 6' (1.829 m)    Wt 171 lb (77.6 kg)    SpO2 98%    BMI 23.19 kg/m         Assessment & Plan:  Joshua [name] in today with chief complaint of Hospitalization Follow-up   1. Community acquired pneumonia, unspecified laterality Continue oxygen at home  2. Chronic diastolic congestive heart failure ([place]) Will wait and deceide onlasix if weight starts going up and sob  worsens Weight daily and report  A>2 lb weight gain in 24hours - CBC with Differential/Platelet - CMP14+EGFR - Magnesium - Phosphorus  3. Hospital discharge follow-up Hospital records and discharge summary reviewed    The above assessment and management plan was discussed with the patient. The patient verbalized understanding of and has agreed to the management plan. Patient is aware to call the clinic if symptoms persist or worsen. Patient is aware when to return to the clinic for a follow-up visit. Patient educated on when it is appropriate to go to the emergency department.   Mary-Margaret Hassell Done, FNP

## 2020-07-18 LAB — CBC WITH DIFFERENTIAL/PLATELET
Basophils Absolute: 0.1 10*3/uL (ref 0.0–0.2)
Basos: 1 %
EOS (ABSOLUTE): 0.7 10*3/uL — ABNORMAL HIGH (ref 0.0–0.4)
Eos: 7 %
Hematocrit: 30.7 % — ABNORMAL LOW (ref 37.5–51.0)
Hemoglobin: 10.2 g/dL — ABNORMAL LOW (ref 13.0–17.7)
Immature Grans (Abs): 0 10*3/uL (ref 0.0–0.1)
Immature Granulocytes: 0 %
Lymphocytes Absolute: 2 10*3/uL (ref 0.7–3.1)
Lymphs: 20 %
MCH: 30 pg (ref 26.6–33.0)
MCHC: 33.2 g/dL (ref 31.5–35.7)
MCV: 90 fL (ref 79–97)
Monocytes Absolute: 1.2 10*3/uL — ABNORMAL HIGH (ref 0.1–0.9)
Monocytes: 12 %
Neutrophils Absolute: 5.9 10*3/uL (ref 1.4–7.0)
Neutrophils: 60 %
Platelets: 339 10*3/uL (ref 150–450)
RBC: 3.4 x10E6/uL — ABNORMAL LOW (ref 4.14–5.80)
RDW: 15.6 % — ABNORMAL HIGH (ref 11.6–15.4)
WBC: 10 10*3/uL (ref 3.4–10.8)

## 2020-07-18 LAB — CMP14+EGFR
ALT: 23 IU/L (ref 0–44)
AST: 25 IU/L (ref 0–40)
Albumin/Globulin Ratio: 1.6 (ref 1.2–2.2)
Albumin: 4.1 g/dL (ref 3.6–4.6)
Alkaline Phosphatase: 157 IU/L — ABNORMAL HIGH (ref 44–121)
BUN/Creatinine Ratio: 16 (ref 10–24)
BUN: 14 mg/dL (ref 8–27)
Bilirubin Total: 0.3 mg/dL (ref 0.0–1.2)
CO2: 25 mmol/L (ref 20–29)
Calcium: 9.3 mg/dL (ref 8.6–10.2)
Chloride: 99 mmol/L (ref 96–106)
Creatinine, Ser: 0.88 mg/dL (ref 0.76–1.27)
GFR calc Af Amer: 90 mL/min/{1.73_m2} (ref >59)
GFR calc non Af Amer: 78 mL/min/{1.73_m2} (ref >59)
Globulin, Total: 2.5 g/dL (ref 1.5–4.5)
Glucose: 80 mg/dL (ref 65–99)
Potassium: 4.3 mmol/L (ref 3.5–5.2)
Sodium: 136 mmol/L (ref 134–144)
Total Protein: 6.6 g/dL (ref 6.0–8.5)

## 2020-07-18 LAB — MAGNESIUM: Magnesium: 2 mg/dL (ref 1.6–2.3)

## 2020-07-18 LAB — PHOSPHORUS: Phosphorus: 3.7 mg/dL (ref 2.8–4.1)

## 2020-07-20 DIAGNOSIS — J432 Centrilobular emphysema: Secondary | ICD-10-CM | POA: Diagnosis not present

## 2020-07-20 DIAGNOSIS — I110 Hypertensive heart disease with heart failure: Secondary | ICD-10-CM | POA: Diagnosis not present

## 2020-07-20 DIAGNOSIS — I5033 Acute on chronic diastolic (congestive) heart failure: Secondary | ICD-10-CM | POA: Diagnosis not present

## 2020-07-20 DIAGNOSIS — I088 Other rheumatic multiple valve diseases: Secondary | ICD-10-CM | POA: Diagnosis not present

## 2020-07-20 DIAGNOSIS — I0981 Rheumatic heart failure: Secondary | ICD-10-CM | POA: Diagnosis not present

## 2020-07-20 DIAGNOSIS — J479 Bronchiectasis, uncomplicated: Secondary | ICD-10-CM | POA: Diagnosis not present

## 2020-07-21 ENCOUNTER — Ambulatory Visit (INDEPENDENT_AMBULATORY_CARE_PROVIDER_SITE_OTHER): Payer: Medicare Other | Admitting: Pulmonary Disease

## 2020-07-21 ENCOUNTER — Ambulatory Visit (INDEPENDENT_AMBULATORY_CARE_PROVIDER_SITE_OTHER): Payer: Medicare Other

## 2020-07-21 ENCOUNTER — Encounter: Payer: Self-pay | Admitting: Pulmonary Disease

## 2020-07-21 ENCOUNTER — Other Ambulatory Visit: Payer: Self-pay

## 2020-07-21 VITALS — BP 134/60 | HR 73 | Temp 97.8°F | Ht 72.0 in | Wt 174.8 lb

## 2020-07-21 DIAGNOSIS — G40909 Epilepsy, unspecified, not intractable, without status epilepticus: Secondary | ICD-10-CM

## 2020-07-21 DIAGNOSIS — J432 Centrilobular emphysema: Secondary | ICD-10-CM

## 2020-07-21 DIAGNOSIS — Z9181 History of falling: Secondary | ICD-10-CM

## 2020-07-21 DIAGNOSIS — J9601 Acute respiratory failure with hypoxia: Secondary | ICD-10-CM | POA: Diagnosis not present

## 2020-07-21 DIAGNOSIS — I0981 Rheumatic heart failure: Secondary | ICD-10-CM

## 2020-07-21 DIAGNOSIS — E876 Hypokalemia: Secondary | ICD-10-CM

## 2020-07-21 DIAGNOSIS — E7800 Pure hypercholesterolemia, unspecified: Secondary | ICD-10-CM

## 2020-07-21 DIAGNOSIS — I5033 Acute on chronic diastolic (congestive) heart failure: Secondary | ICD-10-CM | POA: Diagnosis not present

## 2020-07-21 DIAGNOSIS — J479 Bronchiectasis, uncomplicated: Secondary | ICD-10-CM | POA: Diagnosis not present

## 2020-07-21 DIAGNOSIS — R1312 Dysphagia, oropharyngeal phase: Secondary | ICD-10-CM

## 2020-07-21 DIAGNOSIS — D649 Anemia, unspecified: Secondary | ICD-10-CM

## 2020-07-21 DIAGNOSIS — I2510 Atherosclerotic heart disease of native coronary artery without angina pectoris: Secondary | ICD-10-CM | POA: Diagnosis not present

## 2020-07-21 DIAGNOSIS — I700 Atherosclerosis of aorta: Secondary | ICD-10-CM

## 2020-07-21 DIAGNOSIS — E785 Hyperlipidemia, unspecified: Secondary | ICD-10-CM

## 2020-07-21 DIAGNOSIS — Z950 Presence of cardiac pacemaker: Secondary | ICD-10-CM

## 2020-07-21 DIAGNOSIS — Z955 Presence of coronary angioplasty implant and graft: Secondary | ICD-10-CM

## 2020-07-21 DIAGNOSIS — I252 Old myocardial infarction: Secondary | ICD-10-CM

## 2020-07-21 DIAGNOSIS — K8020 Calculus of gallbladder without cholecystitis without obstruction: Secondary | ICD-10-CM

## 2020-07-21 DIAGNOSIS — I441 Atrioventricular block, second degree: Secondary | ICD-10-CM

## 2020-07-21 DIAGNOSIS — I088 Other rheumatic multiple valve diseases: Secondary | ICD-10-CM

## 2020-07-21 DIAGNOSIS — H3530 Unspecified macular degeneration: Secondary | ICD-10-CM

## 2020-07-21 DIAGNOSIS — Z8551 Personal history of malignant neoplasm of bladder: Secondary | ICD-10-CM

## 2020-07-21 DIAGNOSIS — Z9981 Dependence on supplemental oxygen: Secondary | ICD-10-CM

## 2020-07-21 DIAGNOSIS — I110 Hypertensive heart disease with heart failure: Secondary | ICD-10-CM

## 2020-07-21 DIAGNOSIS — J189 Pneumonia, unspecified organism: Secondary | ICD-10-CM

## 2020-07-21 DIAGNOSIS — Z7982 Long term (current) use of aspirin: Secondary | ICD-10-CM

## 2020-07-21 DIAGNOSIS — D72829 Elevated white blood cell count, unspecified: Secondary | ICD-10-CM

## 2020-07-21 DIAGNOSIS — I25118 Atherosclerotic heart disease of native coronary artery with other forms of angina pectoris: Secondary | ICD-10-CM | POA: Diagnosis not present

## 2020-07-21 DIAGNOSIS — J8410 Pulmonary fibrosis, unspecified: Secondary | ICD-10-CM

## 2020-07-21 DIAGNOSIS — K219 Gastro-esophageal reflux disease without esophagitis: Secondary | ICD-10-CM

## 2020-07-21 DIAGNOSIS — Z8701 Personal history of pneumonia (recurrent): Secondary | ICD-10-CM

## 2020-07-21 DIAGNOSIS — Z7902 Long term (current) use of antithrombotics/antiplatelets: Secondary | ICD-10-CM

## 2020-07-21 DIAGNOSIS — Z8546 Personal history of malignant neoplasm of prostate: Secondary | ICD-10-CM

## 2020-07-21 NOTE — Patient Instructions (Addendum)
Continue the supplemental oxygen We will repeat some labs including ANA IFA, CCP, myositis panel, hypersensitivity panel  Schedule high-res CT and PFTs in 1 to 2 months Follow-up in clinic after for review of tests and plan for next steps

## 2020-07-21 NOTE — Progress Notes (Signed)
Joshua [name]    [id number]    Mar 13, 1932  Primary Care Physician:Martin, Mary-Margaret, FNP  Referring Physician: Chevis Pretty, [place],  [place] [id number]  Chief complaint:  Consult for interstitial lung disease  HPI: 86 year old with CAD status post PCI, chronic diastolic CHF, AV block requiring PPM, COPD, GERD, history of prostate treated with seed implant, history of bladder cancer, hypertension, hyperlipidemia, seizures (caused by intracranial bleed from AV malformation status post craniotomy in 1980)  Admitted with hypoxia on 06/25/2020 thought to be secondary to community-acquired pneumonia, acute on chronic diastolic heart failure with concern for underlying ILD.  He was diuresed, given antibiotics.  CT scan showed some bronchiectasis with reticulation, fluid in the fissure.  Antibiotics were discontinued as it was felt that possibility of infection was not high and he was discharged on prednisone, supplemental oxygen.  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  No mold, hot tub, Jacuzzi.  No feather pillows or comforters Smoking history: 30-pack-year smoker.  Quit in 1970 Travel history: No significant travel history Relevant family history: No significant family history of lung disease   Outpatient Encounter Medications as of 07/21/2020  Medication Sig  . aspirin EC 81 MG tablet Take 81 mg by mouth daily. Swallow whole.  Marland Kitchen atorvastatin (LIPITOR) 20 MG tablet Take 1 tablet (20 mg total) by mouth daily.  . calcium carbonate (OSCAL) 1500 (600 Ca) MG TABS tablet Take 600 mg of elemental calcium by mouth daily with breakfast.  . Cholecalciferol (VITAMIN D3) 50 MCG (2000 UT) capsule Take 2,000 Units by mouth daily.   . citalopram (CELEXA) 20 MG tablet Take 1 tablet (20 mg total) by mouth daily.  . clopidogrel (PLAVIX) 75 MG tablet Take 1 tablet (75 mg total) by mouth daily.  . famotidine  (PEPCID) 20 MG tablet Take 20 mg by mouth daily.   . ferrous sulfate 325 (65 FE) MG tablet Take 1 tablet (325 mg total) by mouth daily with breakfast.  . furosemide (LASIX) 40 MG tablet Take 0.5 tablets (20 mg total) by mouth daily.  Marland Kitchen MAGNESIUM PO Take 1 tablet by mouth at bedtime.  . Multiple Vitamins-Minerals (PRESERVISION AREDS 2) CAPS Take 1 capsule by mouth 2 (two) times daily.   Marland Kitchen PHENobarbital (LUMINAL) 64.8 MG tablet Take 1 tablet (64.8 mg total) by mouth 2 (two) times daily.  . phenytoin (DILANTIN) 100 MG ER capsule TAKE (2) CAPSULES TWICE DAILY.  Marland Kitchen polyvinyl alcohol (LIQUIFILM TEARS) 1.4 % ophthalmic solution Place 2 drops into both eyes daily as needed for dry eyes.   Marland Kitchen spironolactone (ALDACTONE) 25 MG tablet Take 1 tablet (25 mg total) by mouth daily. (Needs to be seen before next refill)   No facility-administered encounter medications on file as of 07/21/2020.    Allergies as of 07/21/2020 - Review Complete 07/21/2020  Allergen Reaction Noted  . Penicillins Rash 08/22/2008    Past Medical History:  Diagnosis Date  . 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 Arthritis   . Bilateral lower extremity edema   . Bladder tumor   . CAD (coronary artery disease) cardiologist--  dr Daneen Schick   a. 04/27/2016 PCI with DES to RCA with 50% ostial to 60% segmental mid to distal left main, and 90% thrombus filled ostial to proximal circumflex, with distal right coronary filled by collaterals from left-to-right  . COPD with  emphysema ([place])   . Dyspnea on exertion   . ED (erectile dysfunction)   . First degree AV block   . GERD (gastroesophageal reflux disease)   . History of adenomatous polyp of colon    09-22-2008  . History of bladder cancer    s/p  resection bladder tumor 04-19-2016  non-invasive low-grade urothelial carcinoma  . History of prostate cancer urologist-  dr Jeffie Pollock-- last PSA 0.02 (summer 2017)   dx 02/ 2013-- Stage  T2a, Gleason 7, PSA 1.58--  s/p  radioactive prostate seed implants 01-19-2012  . History of ST elevation myocardial infarction (STEMI)    04-27-2016  . Hyperlipidemia   . Hypertension   . Macular degeneration   . Presence of permanent cardiac pacemaker   . S/P drug eluting coronary stent placement    04-27-2016   . Second degree Mobitz I AV block    occasional w/ first degree heart block  per cardiologist note by dr Daneen Schick  . Seizures ([place]) per pt son -- no seizure's since 1980   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  arteriorvenous cerebral malformation s/p  craniotomy 1980  . Vitamin D deficiency     Past Surgical History:  Procedure Laterality Date  . APPENDECTOMY  2011  . BLEPHAROPLASTY Bilateral revision 02-17-2016  . CARDIAC CATHETERIZATION N/A 04/27/2016   Procedure: Left Heart Cath and Coronary Angiography;  Surgeon: Belva Crome, MD;  Location: [facility];  Service: Cardiovascular;  Laterality: N/A;  acute inferior wall STEMI ;  ostCx to midCx 90%,  ost1st Mrg to 1st Mrg 50%,  mid to distal RCA 100%,  pRCA 80%,  LVEF 45-50% w/ inferobasal akinesis  . CARDIAC CATHETERIZATION N/A 04/27/2016   Procedure: Temporary Pacemaker;  Surgeon: Belva Crome, MD;  Location: [facility];  Service: Cardiovascular;  Laterality: N/A;  mobitz 2  second degree HB w/ heart rate 29bpm  . CARDIAC CATHETERIZATION N/A 04/27/2016   Procedure: Coronary Stent Intervention;  Surgeon: Belva Crome, MD;  Location: [facility];  Service: Cardiovascular;  Laterality: N/A; DES x1 to  Proximal RCA;  DES x1 to Mid RCA  . CATARACT EXTRACTION W/ INTRAOCULAR LENS  IMPLANT, BILATERAL  2016  approx.  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 CYSTOSCOPY  01/19/2012   Procedure: CYSTOSCOPY;  Surgeon: Malka So, MD;  Location[facility];  Service: Urology;;  no seeds found in bladder  . CYSTOSCOPY W/ RETROGRADES  Bilateral 04/19/2016   Procedure: CYSTOSCOPY WITH BILATERAL RETROGRADE PYELOGRAM TRANSURETHRAL RESECTION OF BLADDER TUMOR ;  Surgeon: [name], MD;  Location: [facility];  Service: Urology;  Laterality: Bilateral;  . CYSTOSCOPY WITH BIOPSY N/A 11/10/2016   Procedure: CYSTOSCOPY WITH BIOPSY AND FULGURATION;  Surgeon: [name], MD;  Location: [facility];  Service: Urology;  Laterality: N/A;  . INSERT / REPLACE / REMOVE PACEMAKER  06/15/2017  . LEAD EXTRACTION N/A 01/22/2018   Procedure: LEAD EXTRACTION;  Surgeon: Evans Lance, MD;  Location: [facility];  Service: Cardiovascular;  Laterality: N/A;  . PACEMAKER IMPLANT N/A 06/15/2017   Procedure: Pacemaker Implant;  Surgeon: [name], MD;  Location: [facility];  Service: Cardiovascular;  Laterality: N/A;  . PACEMAKER IMPLANT N/A 01/25/2018   Procedure: PACEMAKER IMPLANT;  Surgeon: Evans Lance, MD;  Location: [place];  Service: Cardiovascular;  Laterality: N/A;  . PPM GENERATOR  REMOVAL N/A 01/22/2018   Procedure: PPM GENERATOR REMOVAL;  Surgeon: Evans Lance, MD;  Location: [facility];  Service: Cardiovascular;  Laterality: N/A;  . RADIOACTIVE SEED IMPLANT  01/19/2012   Procedure: RADIOACTIVE SEED IMPLANT;  Surgeon: Malka So, MD;  Location: [facility];  Service: Urology;  Laterality: N/A;  68 seeds implanted  . TRANSTHORACIC ECHOCARDIOGRAM  06/02/2016   ef 60-65%/  trivial MR/  mild TR    Family History  Problem Relation Age of Onset  . Emphysema Mother        age 56 deceased  . Stroke Mother   . Emphysema Father        deceased age 57    Social History   Socioeconomic History  . Marital status: Widowed    Spouse name: Not on file  . Number of children: 2  . Years of education: 1 year of college  . Highest education level: Not on file  Occupational History  . Occupation: Retired  Tobacco Use  . Smoking status: Former Smoker    Packs/day: 2.00    Years: 15.00     Pack years: 30.00    Quit date: 01/15/1973    Years since quitting: 47.5  . Smokeless tobacco: Never Used  Vaping Use  . Vaping Use: Never used  Substance and Sexual Activity  . Alcohol use: Yes    Comment: occasionally  . Drug use: No  . Sexual activity: Not Currently  Other Topics Concern  . Not on file  Social History Narrative   Lives at home alone. Has contact with son daily and has dinner with him weekly. [place]    Social Determinants of Health   Financial Resource Strain:   . Difficulty of Paying Living Expenses: Not on file  Food Insecurity:   . Worried About Charity fundraiser in the Last Year: Not on file  . Ran Out of Food in the Last Year: Not on file  Transportation Needs:   . Lack of Transportation (Medical): Not on file  . Lack of Transportation (Non-Medical): Not on file  Physical Activity: Unknown  . Days of Exercise per Week: Not on file  . Minutes of Exercise per Session: 30 min  Stress:   . Feeling of Stress : Not on file  Social Connections:   . Frequency of Communication with Friends and Family: Not on file  . Frequency of Social Gatherings with Friends and Family: Not on file  . Attends Religious Services: Not on file  . Active Member of Clubs or Organizations: Not on file  . Attends Archivist Meetings: Not on file  . Marital Status: Not on file  Intimate Partner Violence:   . Fear of Current or Ex-Partner: Not on file  . Emotionally Abused: Not on file  . Physically Abused: Not on file  . Sexually Abused: Not on file    Review of systems: Review of Systems  Constitutional: Negative for fever and chills.  HENT: Negative.   Eyes: Negative for blurred vision.  Respiratory: as per HPI  Cardiovascular: Negative for chest pain and palpitations.  Gastrointestinal: Negative for vomiting, diarrhea, blood per rectum. Genitourinary: Negative for dysuria, urgency, frequency and hematuria.  Musculoskeletal:  Negative for myalgias, back pain and joint pain.  Skin: Negative for itching and rash.  Neurological: Negative for dizziness, tremors, focal weakness, seizures and loss of consciousness.  Endo/Heme/Allergies: Negative for environmental allergies.  Psychiatric/Behavioral: Negative for depression,  suicidal ideas and hallucinations.  All other systems reviewed and are negative.  Physical Exam: Blood pressure 134/60, pulse 73, temperature 97.8 F (36.6 C), temperature source Skin, height 6' (1.829 m), weight 174 lb 12.8 oz (79.3 kg), SpO2 93 %. Gen:      No acute distress HEENT:  EOMI, sclera anicteric Neck:     No masses; no thyromegaly Lungs:    Clear to auscultation bilaterally; normal respiratory effort CV:         Regular rate and rhythm; no murmurs Abd:      + bowel sounds; soft, non-tender; no palpable masses, no distension Ext:    No edema; adequate peripheral perfusion Skin:      Warm and dry; no rash Neuro: alert and oriented x 3 Psych: normal mood and affect  Data Reviewed: Imaging: CT chest 06/25/2020-emphysema with bronchiectasis, subpleural reticulation, loculated fluid in the right fissure. I have reviewed the images personally.  PFTs:  Labs: 06/26/2020-positive ANA, double-stranded DNA  Assessment:  Evaluation for interstitial lung disease His CT scan shows mild reticulation which is nonspecific and in no particular pattern We will check additional labs today including ANA immunofluorescence, CCP, myositis panel and hypersensitivity panel Continue supplemental oxygen  Reevaluate with high-res CT and PFTs in 1 to 2 months.  Plan/Recommendations: Additional CTD labs High-res CT, PFTs  Marshell Garfinkel MD [facility] 07/21/2020, 9:55 AM  CC: Hassell Done, Mary-Margaret, *

## 2020-07-22 DIAGNOSIS — I110 Hypertensive heart disease with heart failure: Secondary | ICD-10-CM | POA: Diagnosis not present

## 2020-07-22 DIAGNOSIS — J432 Centrilobular emphysema: Secondary | ICD-10-CM | POA: Diagnosis not present

## 2020-07-22 DIAGNOSIS — J479 Bronchiectasis, uncomplicated: Secondary | ICD-10-CM | POA: Diagnosis not present

## 2020-07-22 DIAGNOSIS — I088 Other rheumatic multiple valve diseases: Secondary | ICD-10-CM | POA: Diagnosis not present

## 2020-07-22 DIAGNOSIS — I0981 Rheumatic heart failure: Secondary | ICD-10-CM | POA: Diagnosis not present

## 2020-07-22 DIAGNOSIS — I5033 Acute on chronic diastolic (congestive) heart failure: Secondary | ICD-10-CM | POA: Diagnosis not present

## 2020-07-23 DIAGNOSIS — I0981 Rheumatic heart failure: Secondary | ICD-10-CM | POA: Diagnosis not present

## 2020-07-23 DIAGNOSIS — J479 Bronchiectasis, uncomplicated: Secondary | ICD-10-CM | POA: Diagnosis not present

## 2020-07-23 DIAGNOSIS — I088 Other rheumatic multiple valve diseases: Secondary | ICD-10-CM | POA: Diagnosis not present

## 2020-07-23 DIAGNOSIS — J432 Centrilobular emphysema: Secondary | ICD-10-CM | POA: Diagnosis not present

## 2020-07-23 DIAGNOSIS — I110 Hypertensive heart disease with heart failure: Secondary | ICD-10-CM | POA: Diagnosis not present

## 2020-07-23 DIAGNOSIS — I5033 Acute on chronic diastolic (congestive) heart failure: Secondary | ICD-10-CM | POA: Diagnosis not present

## 2020-07-23 LAB — ANA, IFA COMPREHENSIVE PANEL
Anti Nuclear Antibody (ANA): NEGATIVE
ENA SM Ab Ser-aCnc: <1 AI
SM/RNP: <1 AI
SSA (Ro) (ENA) Antibody, IgG: <1 AI
SSB (La) (ENA) Antibody, IgG: <1 AI
Scleroderma (Scl-70) (ENA) Antibody, IgG: <1 AI
ds DNA Ab: 10 IU/mL — ABNORMAL HIGH

## 2020-07-23 LAB — CYCLIC CITRUL PEPTIDE ANTIBODY, IGG: Cyclic Citrullin Peptide Ab: 19 UNITS

## 2020-07-27 DIAGNOSIS — I5033 Acute on chronic diastolic (congestive) heart failure: Secondary | ICD-10-CM | POA: Diagnosis not present

## 2020-07-27 DIAGNOSIS — I0981 Rheumatic heart failure: Secondary | ICD-10-CM | POA: Diagnosis not present

## 2020-07-27 DIAGNOSIS — I088 Other rheumatic multiple valve diseases: Secondary | ICD-10-CM | POA: Diagnosis not present

## 2020-07-27 DIAGNOSIS — J432 Centrilobular emphysema: Secondary | ICD-10-CM | POA: Diagnosis not present

## 2020-07-27 DIAGNOSIS — J479 Bronchiectasis, uncomplicated: Secondary | ICD-10-CM | POA: Diagnosis not present

## 2020-07-27 DIAGNOSIS — I110 Hypertensive heart disease with heart failure: Secondary | ICD-10-CM | POA: Diagnosis not present

## 2020-07-28 ENCOUNTER — Ambulatory Visit (INDEPENDENT_AMBULATORY_CARE_PROVIDER_SITE_OTHER): Payer: Medicare Other | Admitting: *Deleted

## 2020-07-28 ENCOUNTER — Other Ambulatory Visit: Payer: Self-pay

## 2020-07-28 DIAGNOSIS — J479 Bronchiectasis, uncomplicated: Secondary | ICD-10-CM | POA: Diagnosis not present

## 2020-07-28 DIAGNOSIS — J432 Centrilobular emphysema: Secondary | ICD-10-CM | POA: Diagnosis not present

## 2020-07-28 DIAGNOSIS — Z23 Encounter for immunization: Secondary | ICD-10-CM

## 2020-07-28 DIAGNOSIS — I110 Hypertensive heart disease with heart failure: Secondary | ICD-10-CM | POA: Diagnosis not present

## 2020-07-28 DIAGNOSIS — I088 Other rheumatic multiple valve diseases: Secondary | ICD-10-CM | POA: Diagnosis not present

## 2020-07-28 DIAGNOSIS — I5033 Acute on chronic diastolic (congestive) heart failure: Secondary | ICD-10-CM | POA: Diagnosis not present

## 2020-07-28 DIAGNOSIS — I0981 Rheumatic heart failure: Secondary | ICD-10-CM | POA: Diagnosis not present

## 2020-07-30 DIAGNOSIS — Z961 Presence of intraocular lens: Secondary | ICD-10-CM | POA: Diagnosis not present

## 2020-07-30 DIAGNOSIS — H04123 Dry eye syndrome of bilateral lacrimal glands: Secondary | ICD-10-CM | POA: Diagnosis not present

## 2020-07-30 DIAGNOSIS — I5033 Acute on chronic diastolic (congestive) heart failure: Secondary | ICD-10-CM | POA: Diagnosis not present

## 2020-07-30 DIAGNOSIS — I110 Hypertensive heart disease with heart failure: Secondary | ICD-10-CM | POA: Diagnosis not present

## 2020-07-30 DIAGNOSIS — I0981 Rheumatic heart failure: Secondary | ICD-10-CM | POA: Diagnosis not present

## 2020-07-30 DIAGNOSIS — Z9841 Cataract extraction status, right eye: Secondary | ICD-10-CM | POA: Diagnosis not present

## 2020-07-30 DIAGNOSIS — J432 Centrilobular emphysema: Secondary | ICD-10-CM | POA: Diagnosis not present

## 2020-07-30 DIAGNOSIS — I088 Other rheumatic multiple valve diseases: Secondary | ICD-10-CM | POA: Diagnosis not present

## 2020-07-30 DIAGNOSIS — J479 Bronchiectasis, uncomplicated: Secondary | ICD-10-CM | POA: Diagnosis not present

## 2020-07-30 DIAGNOSIS — Z9842 Cataract extraction status, left eye: Secondary | ICD-10-CM | POA: Diagnosis not present

## 2020-07-30 DIAGNOSIS — H353231 Exudative age-related macular degeneration, bilateral, with active choroidal neovascularization: Secondary | ICD-10-CM | POA: Diagnosis not present

## 2020-07-31 ENCOUNTER — Ambulatory Visit (INDEPENDENT_AMBULATORY_CARE_PROVIDER_SITE_OTHER): Payer: Medicare Other

## 2020-07-31 ENCOUNTER — Other Ambulatory Visit: Payer: Self-pay

## 2020-07-31 DIAGNOSIS — Z7902 Long term (current) use of antithrombotics/antiplatelets: Secondary | ICD-10-CM | POA: Diagnosis not present

## 2020-07-31 DIAGNOSIS — K8020 Calculus of gallbladder without cholecystitis without obstruction: Secondary | ICD-10-CM | POA: Diagnosis not present

## 2020-07-31 DIAGNOSIS — I2510 Atherosclerotic heart disease of native coronary artery without angina pectoris: Secondary | ICD-10-CM | POA: Diagnosis not present

## 2020-07-31 DIAGNOSIS — E7800 Pure hypercholesterolemia, unspecified: Secondary | ICD-10-CM | POA: Diagnosis not present

## 2020-07-31 DIAGNOSIS — H3530 Unspecified macular degeneration: Secondary | ICD-10-CM | POA: Diagnosis not present

## 2020-07-31 DIAGNOSIS — E876 Hypokalemia: Secondary | ICD-10-CM | POA: Diagnosis not present

## 2020-07-31 DIAGNOSIS — K219 Gastro-esophageal reflux disease without esophagitis: Secondary | ICD-10-CM | POA: Diagnosis not present

## 2020-07-31 DIAGNOSIS — E785 Hyperlipidemia, unspecified: Secondary | ICD-10-CM | POA: Diagnosis not present

## 2020-07-31 DIAGNOSIS — J479 Bronchiectasis, uncomplicated: Secondary | ICD-10-CM | POA: Diagnosis not present

## 2020-07-31 DIAGNOSIS — I700 Atherosclerosis of aorta: Secondary | ICD-10-CM | POA: Diagnosis not present

## 2020-07-31 DIAGNOSIS — J432 Centrilobular emphysema: Secondary | ICD-10-CM | POA: Diagnosis not present

## 2020-07-31 DIAGNOSIS — I441 Atrioventricular block, second degree: Secondary | ICD-10-CM | POA: Diagnosis not present

## 2020-07-31 DIAGNOSIS — I5033 Acute on chronic diastolic (congestive) heart failure: Secondary | ICD-10-CM | POA: Diagnosis not present

## 2020-07-31 DIAGNOSIS — Z7982 Long term (current) use of aspirin: Secondary | ICD-10-CM | POA: Diagnosis not present

## 2020-07-31 DIAGNOSIS — I088 Other rheumatic multiple valve diseases: Secondary | ICD-10-CM | POA: Diagnosis not present

## 2020-07-31 DIAGNOSIS — I0981 Rheumatic heart failure: Secondary | ICD-10-CM | POA: Diagnosis not present

## 2020-07-31 DIAGNOSIS — Z9981 Dependence on supplemental oxygen: Secondary | ICD-10-CM | POA: Diagnosis not present

## 2020-07-31 DIAGNOSIS — J8410 Pulmonary fibrosis, unspecified: Secondary | ICD-10-CM | POA: Diagnosis not present

## 2020-07-31 DIAGNOSIS — R1312 Dysphagia, oropharyngeal phase: Secondary | ICD-10-CM | POA: Diagnosis not present

## 2020-07-31 DIAGNOSIS — D72829 Elevated white blood cell count, unspecified: Secondary | ICD-10-CM | POA: Diagnosis not present

## 2020-07-31 DIAGNOSIS — I252 Old myocardial infarction: Secondary | ICD-10-CM | POA: Diagnosis not present

## 2020-07-31 DIAGNOSIS — I110 Hypertensive heart disease with heart failure: Secondary | ICD-10-CM | POA: Diagnosis not present

## 2020-07-31 DIAGNOSIS — J9601 Acute respiratory failure with hypoxia: Secondary | ICD-10-CM | POA: Diagnosis not present

## 2020-07-31 DIAGNOSIS — D649 Anemia, unspecified: Secondary | ICD-10-CM | POA: Diagnosis not present

## 2020-07-31 DIAGNOSIS — G40909 Epilepsy, unspecified, not intractable, without status epilepticus: Secondary | ICD-10-CM | POA: Diagnosis not present

## 2020-08-03 DIAGNOSIS — J432 Centrilobular emphysema: Secondary | ICD-10-CM | POA: Diagnosis not present

## 2020-08-03 DIAGNOSIS — I0981 Rheumatic heart failure: Secondary | ICD-10-CM | POA: Diagnosis not present

## 2020-08-03 DIAGNOSIS — I088 Other rheumatic multiple valve diseases: Secondary | ICD-10-CM | POA: Diagnosis not present

## 2020-08-03 DIAGNOSIS — I5033 Acute on chronic diastolic (congestive) heart failure: Secondary | ICD-10-CM | POA: Diagnosis not present

## 2020-08-03 DIAGNOSIS — I110 Hypertensive heart disease with heart failure: Secondary | ICD-10-CM | POA: Diagnosis not present

## 2020-08-03 DIAGNOSIS — J479 Bronchiectasis, uncomplicated: Secondary | ICD-10-CM | POA: Diagnosis not present

## 2020-08-04 DIAGNOSIS — I0981 Rheumatic heart failure: Secondary | ICD-10-CM | POA: Diagnosis not present

## 2020-08-04 DIAGNOSIS — I5033 Acute on chronic diastolic (congestive) heart failure: Secondary | ICD-10-CM | POA: Diagnosis not present

## 2020-08-04 DIAGNOSIS — J432 Centrilobular emphysema: Secondary | ICD-10-CM | POA: Diagnosis not present

## 2020-08-04 DIAGNOSIS — J479 Bronchiectasis, uncomplicated: Secondary | ICD-10-CM | POA: Diagnosis not present

## 2020-08-04 DIAGNOSIS — I088 Other rheumatic multiple valve diseases: Secondary | ICD-10-CM | POA: Diagnosis not present

## 2020-08-04 DIAGNOSIS — I110 Hypertensive heart disease with heart failure: Secondary | ICD-10-CM | POA: Diagnosis not present

## 2020-08-05 DIAGNOSIS — J479 Bronchiectasis, uncomplicated: Secondary | ICD-10-CM | POA: Diagnosis not present

## 2020-08-05 DIAGNOSIS — I0981 Rheumatic heart failure: Secondary | ICD-10-CM | POA: Diagnosis not present

## 2020-08-05 DIAGNOSIS — I110 Hypertensive heart disease with heart failure: Secondary | ICD-10-CM | POA: Diagnosis not present

## 2020-08-05 DIAGNOSIS — J432 Centrilobular emphysema: Secondary | ICD-10-CM | POA: Diagnosis not present

## 2020-08-05 DIAGNOSIS — I088 Other rheumatic multiple valve diseases: Secondary | ICD-10-CM | POA: Diagnosis not present

## 2020-08-05 DIAGNOSIS — I5033 Acute on chronic diastolic (congestive) heart failure: Secondary | ICD-10-CM | POA: Diagnosis not present

## 2020-08-05 LAB — MYOMARKER 3 PLUS PROFILE (RDL)
Anti-EJ Ab (RDL): NEGATIVE
Anti-Jo-1 Ab (RDL): <20 Units (ref <20)
Anti-Ku Ab (RDL): NEGATIVE
Anti-MDA-5 Ab (CADM-140)(RDL): <20 Units (ref <20)
Anti-Mi-2 Ab (RDL): NEGATIVE
Anti-NXP-2 (P140) Ab (RDL): <20 Units (ref <20)
Anti-OJ Ab (RDL): NEGATIVE
Anti-PL-12 Ab (RDL: NEGATIVE
Anti-PL-7 Ab (RDL): NEGATIVE
Anti-PM/Scl-100 Ab (RDL): <20 Units (ref <20)
Anti-SAE1 Ab, IgG (RDL): <20 Units (ref <20)
Anti-SRP Ab (RDL): NEGATIVE
Anti-SS-A 52kD Ab, IgG (RDL): 28 Units — ABNORMAL HIGH (ref <20)
Anti-TIF-1gamma Ab (RDL): <20 Units (ref <20)
Anti-U1 RNP Ab (RDL): <20 Units (ref <20)
Anti-U2 RNP Ab (RDL): NEGATIVE
Anti-U3 RNP (Fibrillarin)(RDL): NEGATIVE

## 2020-08-05 LAB — HYPERSENSITIVITY PNEUMONITIS
A. Pullulans Abs: NEGATIVE
A.Fumigatus #1 Abs: NEGATIVE
Micropolyspora faeni, IgG: NEGATIVE
Pigeon Serum Abs: NEGATIVE
Thermoact. Saccharii: NEGATIVE
Thermoactinomyces vulgaris, IgG: NEGATIVE

## 2020-08-07 ENCOUNTER — Other Ambulatory Visit: Payer: Self-pay | Admitting: Nurse Practitioner

## 2020-08-10 ENCOUNTER — Telehealth: Payer: Self-pay

## 2020-08-10 DIAGNOSIS — J479 Bronchiectasis, uncomplicated: Secondary | ICD-10-CM | POA: Diagnosis not present

## 2020-08-10 DIAGNOSIS — I110 Hypertensive heart disease with heart failure: Secondary | ICD-10-CM | POA: Diagnosis not present

## 2020-08-10 DIAGNOSIS — I0981 Rheumatic heart failure: Secondary | ICD-10-CM | POA: Diagnosis not present

## 2020-08-10 DIAGNOSIS — I5033 Acute on chronic diastolic (congestive) heart failure: Secondary | ICD-10-CM | POA: Diagnosis not present

## 2020-08-10 DIAGNOSIS — J432 Centrilobular emphysema: Secondary | ICD-10-CM | POA: Diagnosis not present

## 2020-08-10 DIAGNOSIS — I088 Other rheumatic multiple valve diseases: Secondary | ICD-10-CM | POA: Diagnosis not present

## 2020-08-10 NOTE — Telephone Encounter (Signed)
  Prescription Request  08/10/2020  What is the name of the medication or equipment? phenytoin (DILANTIN) 100 MG ER capsule    Have you contacted your pharmacy to request a refill? (if applicable) yes, he needs a 30day supply sent to local pharmacy  Which pharmacy would you like this sent to? [facility]    Patient notified that their request is being sent to the clinical staff for review and that they should receive a response within 2 business days.

## 2020-08-11 MED ORDER — PHENYTOIN SODIUM EXTENDED 100 MG PO CAPS: ORAL_CAPSULE | ORAL | 0 refills | Status: DC

## 2020-08-11 NOTE — Telephone Encounter (Signed)
Patient's son ([name]) aware that 30 day supply has been sent to [facility]

## 2020-08-14 DIAGNOSIS — I0981 Rheumatic heart failure: Secondary | ICD-10-CM | POA: Diagnosis not present

## 2020-08-14 DIAGNOSIS — I5033 Acute on chronic diastolic (congestive) heart failure: Secondary | ICD-10-CM | POA: Diagnosis not present

## 2020-08-14 DIAGNOSIS — J479 Bronchiectasis, uncomplicated: Secondary | ICD-10-CM | POA: Diagnosis not present

## 2020-08-14 DIAGNOSIS — J432 Centrilobular emphysema: Secondary | ICD-10-CM | POA: Diagnosis not present

## 2020-08-14 DIAGNOSIS — I110 Hypertensive heart disease with heart failure: Secondary | ICD-10-CM | POA: Diagnosis not present

## 2020-08-14 DIAGNOSIS — I088 Other rheumatic multiple valve diseases: Secondary | ICD-10-CM | POA: Diagnosis not present

## 2020-08-17 DIAGNOSIS — J432 Centrilobular emphysema: Secondary | ICD-10-CM | POA: Diagnosis not present

## 2020-08-17 DIAGNOSIS — J479 Bronchiectasis, uncomplicated: Secondary | ICD-10-CM | POA: Diagnosis not present

## 2020-08-17 DIAGNOSIS — I110 Hypertensive heart disease with heart failure: Secondary | ICD-10-CM | POA: Diagnosis not present

## 2020-08-17 DIAGNOSIS — I5033 Acute on chronic diastolic (congestive) heart failure: Secondary | ICD-10-CM | POA: Diagnosis not present

## 2020-08-17 DIAGNOSIS — I088 Other rheumatic multiple valve diseases: Secondary | ICD-10-CM | POA: Diagnosis not present

## 2020-08-17 DIAGNOSIS — I0981 Rheumatic heart failure: Secondary | ICD-10-CM | POA: Diagnosis not present

## 2020-08-20 ENCOUNTER — Encounter: Payer: Self-pay | Admitting: Primary Care

## 2020-08-20 ENCOUNTER — Other Ambulatory Visit: Payer: Self-pay

## 2020-08-20 ENCOUNTER — Ambulatory Visit (INDEPENDENT_AMBULATORY_CARE_PROVIDER_SITE_OTHER): Payer: Medicare Other

## 2020-08-20 ENCOUNTER — Other Ambulatory Visit: Payer: Self-pay | Admitting: *Deleted

## 2020-08-20 ENCOUNTER — Ambulatory Visit (INDEPENDENT_AMBULATORY_CARE_PROVIDER_SITE_OTHER): Payer: Medicare Other | Admitting: Primary Care

## 2020-08-20 VITALS — BP 150/60 | HR 86 | Temp 98.6°F | Ht 72.0 in | Wt 170.0 lb

## 2020-08-20 DIAGNOSIS — J189 Pneumonia, unspecified organism: Secondary | ICD-10-CM

## 2020-08-20 DIAGNOSIS — R0602 Shortness of breath: Secondary | ICD-10-CM

## 2020-08-20 DIAGNOSIS — I517 Cardiomegaly: Secondary | ICD-10-CM | POA: Diagnosis not present

## 2020-08-20 MED ORDER — ALBUTEROL SULFATE HFA 108 (90 BASE) MCG/ACT IN AERS: 1.0000 | INHALATION_SPRAY | Freq: Four times a day (QID) | RESPIRATORY_TRACT | 0 refills | Status: AC | PRN

## 2020-08-20 MED ORDER — PREDNISONE 10 MG PO TABS: ORAL_TABLET | ORAL | 0 refills | Status: DC

## 2020-08-20 MED ORDER — CLOPIDOGREL BISULFATE 75 MG PO TABS
75.0000 mg | ORAL_TABLET | Freq: Every day | ORAL | 0 refills | Status: DC
Start: 2020-08-20 — End: 2020-08-28

## 2020-08-20 MED ORDER — LEVOFLOXACIN 500 MG PO TABS: 500.0000 mg | ORAL_TABLET | Freq: Every day | ORAL | 0 refills | Status: DC

## 2020-08-20 NOTE — Patient Instructions (Addendum)
CXR imaging showed evidence of right side pneumonia  Recommendations: - Take antibiotic and prednisone (steriod) as prescribed - Sending in RX for rescue inhaler Albuterol - take 2 puffs every 4-6 hours as needed for shortness of breath - Use incentive spirometer every hours/ Use flutter valve 3 times a day - Stay well hydrated. Get out of bed 3 times a day, do not over exert your self - Use 3-4 L of oxygen at home or try removing extra tubing  - If symptoms acutely worsen or oxygen level sustaining <88% need to call EMS for ED evaluation   Rx: - Levaquin 500mg  1 tab daily x 7 days - Prednisone taper (4 tabs x 3 days;  3 tabs x 3 days;  2 tabs x 2 days; 1 tab x 3 days; then stop) - Albuterol 2 puffs every 4-6 hours for shortness of breath  Follow-up: - Televisit in 1 week with Beth NP    Community-Acquired Pneumonia, Adult Pneumonia is an infection of the lungs. It causes swelling in the airways of the lungs. Mucus and fluid may also build up inside the airways. One type of pneumonia can happen while a person is in a hospital. A different type can happen when a person is not in a hospital (community-acquired pneumonia).  What are the causes?  This condition is caused by germs (viruses, bacteria, or fungi). Some types of germs can be passed from one person to another. This can happen when you breathe in droplets from the cough or sneeze of an infected person. What increases the risk? You are more likely to develop this condition if you:  Have a long-term (chronic) disease, such as: ? Chronic obstructive pulmonary disease (COPD). ? Asthma. ? Cystic fibrosis. ? Congestive heart failure. ? Diabetes. ? Kidney disease.  Have HIV.  Have sickle cell disease.  Have had your spleen removed.  Do not take good care of your teeth and mouth (poor dental hygiene).  Have a medical condition that increases the risk of breathing in droplets from your own mouth and nose.  Have a weakened  body defense system (immune system).  Are a smoker.  Travel to areas where the germs that cause this illness are common.  Are around certain animals or the places they live. What are the signs or symptoms?  A dry cough.  A wet (productive) cough.  Fever.  Sweating.  Chest pain. This often happens when breathing deeply or coughing.  Fast breathing or trouble breathing.  Shortness of breath.  Shaking chills.  Feeling tired (fatigue).  Muscle aches. How is this treated? Treatment for this condition depends on many things. Most adults can be treated at home. In some cases, treatment must happen in a hospital. Treatment may include:  Medicines given by mouth or through an IV tube.  Being given extra oxygen.  Respiratory therapy. In rare cases, treatment for very bad pneumonia may include:  Using a machine to help you breathe.  Having a procedure to remove fluid from around your lungs. Follow these instructions at home: Medicines  Take over-the-counter and prescription medicines only as told by your doctor. ? Only take cough medicine if you are losing sleep.  If you were prescribed an antibiotic medicine, take it as told by your doctor. Do not stop taking the antibiotic even if you start to feel better. General instructions   Sleep with your head and neck raised (elevated). You can do this by sleeping in a recliner or by putting  a few pillows under your head.  Rest as needed. Get at least 8 hours of sleep each night.  Drink enough water to keep your pee (urine) pale yellow.  Eat a healthy diet that includes plenty of vegetables, fruits, whole grains, low-fat dairy products, and lean protein.  Do not use any products that contain nicotine or tobacco. These include cigarettes, e-cigarettes, and chewing tobacco. If you need help quitting, ask your doctor.  Keep all follow-up visits as told by your doctor. This is important. How is this prevented? A shot (vaccine)  can help prevent pneumonia. Shots are often suggested for:  People older than 84 years of age.  People older than 84 years of age who: ? Are having cancer treatment. ? Have long-term (chronic) lung disease. ? Have problems with their body's defense system. You may also prevent pneumonia if you take these actions:  Get the flu (influenza) shot every year.  Go to the dentist as often as told.  Wash your hands often. If you cannot use soap and water, use hand sanitizer. Contact a doctor if:  You have a fever.  You lose sleep because your cough medicine does not help. Get help right away if:  You are short of breath and it gets worse.  You have more chest pain.  Your sickness gets worse. This is very serious if: ? You are an older adult. ? Your body's defense system is weak.  You cough up blood. Summary  Pneumonia is an infection of the lungs.  Most adults can be treated at home. Some will need treatment in a hospital.  Drink enough water to keep your pee pale yellow.  Get at least 8 hours of sleep each night. This information is not intended to replace advice given to you by your health care provider. Make sure you discuss any questions you have with your health care provider. Document Revised: 12/26/2018 Document Reviewed: 05/03/2018 Elsevier Patient Education  [place].

## 2020-08-20 NOTE — Assessment & Plan Note (Addendum)
-   Patient presents today with acute onset shortness of breath, fatigue and weakness x 1 day. Absent cough or fever. CXR imaging showed evidence of right side infiltrate, radiologist report not resulted. Had Dr. Vaughan Browner review. Clinically he looks stable. Lung sounds with rales/rhonchi to bilateral lungs R>L. O2 95% on 3L. Sending in prescription for Levaquin, prednisone taper and Albuterol hfa. Continue 3-4L oxygen at home. Encouraged patient to use IS and flutter valve. Stay well hydrated and get out of bed 2-3 times a day. Televisit in 1 week and fu with Dr. Vaughan Browner in January.   Recommendations: - Take antibiotic and prednisone (steriod) as prescribed - Sending in RX for rescue inhaler Albuterol - take 2 puffs every 4-6 hours as needed for shortness of breath - Use incentive spirometer every hours/ Use flutter valve 3 times a day - Stay well hydrated. Get out of bed 3 times a day, do not over exert your self - Use 3-4 L of oxygen at home or try removing extra tubing  - If symptoms acutely worsen or oxygen level sustaining <88% need to call EMS for ED evaluation   Rx: - Levaquin 500mg  1 tab daily x 7 days - Prednisone taper (4 tabs x 3 days;  3 tabs x 3 days;  2 tabs x 3 days; 1 tab x 3 days; then stop) - Albuterol 2 puffs every 4-6 hours for shortness of breath  Follow-up: - Televisit in 1 week with Eustaquio Maize NP

## 2020-08-20 NOTE — Progress Notes (Signed)
@Patient  ID: Joshua [name], male    DOB: 09-Aug-1932, 86 y.o.   MRN: [id number]  Chief Complaint  Patient presents with   Acute Visit    Increased sob and fatigue since yesterday with minimal exertion, good appetite    Referring provider: Chevis Pretty, *  HPI:  86 year old, former smoker quit in 1974 (30 pack year hx). PMH CAD status post PCI, chronic diastolic CHF, AV block requiring PPM, COPD, GERD, history of prostate treated with seed implant, history of bladder cancer, hypertension, hyperlipidemia, seizures (caused by intracranial bleed from AV malformation status post craniotomy in 1980)  Previous [facility] pulmoanry encounter: 07/21/20- Dr. Vaughan Browner, Consult Admitted with hypoxia on 06/25/2020 thought to be secondary to community-acquired pneumonia, acute on chronic diastolic heart failure with concern for underlying ILD.  He was diuresed, given antibiotics.  CT scan showed some bronchiectasis with reticulation, fluid in the fissure.  Antibiotics were discontinued as it was felt that possibility of infection was not high and he was discharged on prednisone, supplemental oxygen.  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  No mold, hot tub, Jacuzzi.  No feather pillows or comforters Smoking history: 30-pack-year smoker.  Quit in 1970 Travel history: No significant travel history Relevant family history: No significant family history of lung disease  08/20/2020- Interim hx  Patient presents today for increased sob and fatigue x 1 day. Patient of Dr. Matilde Bash. CT scan showed mild reticulation which is nonspecific in no particular pattern. Ordered for additional labs including ANA, CCP, myositis panel and hypersensitivity panel. Needs repeat CT imaging and PFTs in 1-2 months.   He was brought to his appointment by EMS, accompanied by son. Family called them this morning d/t weakness/fatigue. Symptoms came on suddenly. Exam and vital  signs appear stable. He wears 3L oxygen continuously. They have noticed O2 in 80s with exertion. He has long oxygen tubing at his house. He does not have a cough. Appetite has been ok. Denies f/c/s, confusion, purulent cough, chest tightness, wheezing, N/V/D.   Labs: ANA negative HSP panel negative  CCP normal   Allergies  Allergen Reactions   Penicillins Rash    Has patient had a PCN reaction causing immediate rash, facial/tongue/throat swelling, SOB or lightheadedness with hypotension: ##Yes## Has patient had a PCN reaction causing severe rash involving mucus membranes or skin necrosis: No Has patient had a PCN reaction that required hospitalization No Has patient had a PCN reaction occurring within the last 10 years: No If all of the above answers are "NO", then may proceed with Cephalosporin use.     Immunization History  Administered Date(s) Administered   Fluad Quad(high Dose 65+) 07/01/2019, 07/28/2020   Influenza, High Dose Seasonal PF 07/08/2016, 08/18/2017, 08/29/2018   Influenza,inj,Quad PF,6+ Mos 07/08/2013, 09/10/2014, 08/26/2015   Moderna SARS-COVID-2 Vaccination 10/12/2019, 11/09/2019, 07/28/2020   Pneumococcal Conjugate-13 07/08/2013   Pneumococcal Polysaccharide-23 08/18/2017   Td 12/11/2000, 08/31/2011   Tdap 08/31/2011    Past Medical History:  Diagnosis Date   Arteriovenous malformation    caused intracerebrial bleed w seizure -- post craniotomy 1980   Arthritis    Bilateral lower extremity edema    Bladder tumor    CAD (coronary artery disease) cardiologist--  dr Daneen Schick   a. 04/27/2016 PCI with DES to RCA with 50% ostial to 60% segmental mid to distal left main, and 90% thrombus filled ostial to proximal circumflex, with distal right coronary filled by collaterals from left-to-right   COPD  with emphysema ([place])    Dyspnea on exertion    ED (erectile dysfunction)    First degree AV block    GERD (gastroesophageal reflux disease)     History of adenomatous polyp of colon    09-22-2008   History of bladder cancer    s/p  resection bladder tumor 04-19-2016  non-invasive low-grade urothelial carcinoma   History of prostate cancer urologist-  dr Jeffie Pollock-- last PSA 0.02 (summer 2017)   dx 02/ 2013-- Stage T2a, Gleason 7, PSA 1.58--  s/p  radioactive prostate seed implants 01-19-2012   History of ST elevation myocardial infarction (STEMI)    04-27-2016   Hyperlipidemia    Hypertension    Macular degeneration    Presence of permanent cardiac pacemaker    S/P drug eluting coronary stent placement    04-27-2016    Second degree Mobitz I AV block    occasional w/ first degree heart block  per cardiologist note by dr Daneen Schick   Seizures [facility]) per pt son -- no seizure's since 1980   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  arteriorvenous cerebral malformation s/p  craniotomy 1980   Vitamin D deficiency     Tobacco History: Social History   Tobacco Use  Smoking Status Former Smoker   Packs/day: 2.00   Years: 15.00   Pack years: 30.00   Quit date: 01/15/1973   Years since quitting: 47.6  Smokeless Tobacco Never Used   Counseling given: Not Answered   Outpatient Medications Prior to Visit  Medication Sig Dispense Refill   aspirin EC 81 MG tablet Take 81 mg by mouth daily. Swallow whole.     atorvastatin (LIPITOR) 20 MG tablet Take 1 tablet (20 mg total) by mouth daily. 90 tablet 0   calcium carbonate (OSCAL) 1500 (600 Ca) MG TABS tablet Take 600 mg of elemental calcium by mouth daily with breakfast.     Cholecalciferol (VITAMIN D3) 50 MCG (2000 UT) capsule Take 2,000 Units by mouth daily.      citalopram (CELEXA) 20 MG tablet Take 1 tablet (20 mg total) by mouth daily. 90 tablet 1   clopidogrel (PLAVIX) 75 MG tablet Take 1 tablet (75 mg total) by mouth daily. 90 tablet 3   famotidine (PEPCID) 20 MG tablet Take 20 mg by mouth daily.      ferrous sulfate 325 (65 FE) MG tablet Take 1  tablet (325 mg total) by mouth daily with breakfast.  3   MAGNESIUM PO Take 1 tablet by mouth at bedtime.     Multiple Vitamins-Minerals (PRESERVISION AREDS 2) CAPS Take 1 capsule by mouth 2 (two) times daily.      PHENobarbital (LUMINAL) 64.8 MG tablet Take 1 tablet (64.8 mg total) by mouth 2 (two) times daily. 180 tablet 0   phenytoin (DILANTIN) 100 MG ER capsule TAKE (2) CAPSULES TWICE DAILY. 120 capsule 0   polyvinyl alcohol (LIQUIFILM TEARS) 1.4 % ophthalmic solution Place 2 drops into both eyes daily as needed for dry eyes.      spironolactone (ALDACTONE) 25 MG tablet Take 1 tablet (25 mg total) by mouth daily. (Needs to be seen before next refill) 90 tablet 3   furosemide (LASIX) 40 MG tablet Take 0.5 tablets (20 mg total) by mouth daily. 15 tablet 0   No facility-administered medications prior to visit.    Review of Systems  Review of Systems  Constitutional: Positive for fatigue.  HENT: Negative.   Respiratory: Positive for shortness of breath. Negative  for cough, chest tightness and wheezing.   Cardiovascular: Negative.   Neurological: Positive for weakness.   Physical Exam  BP (!) 150/60 (BP Location: Left Arm, Cuff Size: Normal)    Pulse 86    Temp 98.6 F (37 C) (Temporal)    Ht 6' (1.829 m)    Wt 170 lb (77.1 kg)    SpO2 95%    BMI 23.06 kg/m  Physical Exam Constitutional:      General: He is not in acute distress.    Appearance: Normal appearance.  HENT:     Head: Normocephalic and atraumatic.     Mouth/Throat:     Comments: Deferred d/t masking Cardiovascular:     Rate and Rhythm: Normal rate and regular rhythm.     Comments: RRR; no edema Pulmonary:     Effort: Pulmonary effort is normal. No respiratory distress.     Breath sounds: Rhonchi and rales present.     Comments: Normal effort, no distress. Rales right middle lobe and bilateral bases; O2 95% on 3L Musculoskeletal:        General: Normal range of motion.     Cervical back: Normal range of  motion and neck supple.  Skin:    General: Skin is warm and dry.  Neurological:     General: No focal deficit present.     Mental Status: He is alert and oriented to person, place, and time. Mental status is at baseline.  Psychiatric:        Mood and Affect: Mood normal.        Behavior: Behavior normal.        Thought Content: Thought content normal.        Judgment: Judgment normal.      Lab Results:  CBC    Component Value Date/Time   WBC 10.0 07/17/2020 1427   WBC 13.2 (H) 06/26/2020 0115   RBC 3.40 (L) 07/17/2020 1427   RBC 3.36 (L) 06/26/2020 0115   HGB 10.2 (L) 07/17/2020 1427   HCT 30.7 (L) 07/17/2020 1427   PLT 339 07/17/2020 1427   MCV 90 07/17/2020 1427   MCH 30.0 07/17/2020 1427   MCH 31.0 06/26/2020 0115   MCHC 33.2 07/17/2020 1427   MCHC 32.5 06/26/2020 0115   RDW 15.6 (H) 07/17/2020 1427   LYMPHSABS 2.0 07/17/2020 1427   MONOABS 2.1 (H) 06/26/2020 0115   EOSABS 0.7 (H) 07/17/2020 1427   BASOSABS 0.1 07/17/2020 1427    BMET    Component Value Date/Time   NA 136 07/17/2020 1427   K 4.3 07/17/2020 1427   CL 99 07/17/2020 1427   CO2 25 07/17/2020 1427   GLUCOSE 80 07/17/2020 1427   GLUCOSE 114 (H) 06/26/2020 0115   BUN 14 07/17/2020 1427   CREATININE 0.88 07/17/2020 1427   CREATININE 0.94 05/16/2016 1602   CALCIUM 9.3 07/17/2020 1427   GFRNONAA 78 07/17/2020 1427   GFRAA 90 07/17/2020 1427    BNP    Component Value Date/Time   BNP 700.2 (H) 06/22/2020 1523    ProBNP    Component Value Date/Time   PROBNP 424 08/21/2019 1638    Imaging: DG Chest 2 View  Result Date: 08/20/2020 CLINICAL DATA:  Shortness of breath. EXAM: CHEST - 2 VIEW COMPARISON:  06/26/2020. FINDINGS: Cardiac pacer with lead tips over the right atrium right ventricle. Cardiomegaly. Increase interstitial markings noted bilaterally suggesting interstitial edema and or pneumonitis. No pleural effusion or pneumothorax. Thoracic spine scoliosis and degenerative change. Stable  midthoracic vertebral body compression fracture. IMPRESSION: 1. Cardiac pacer with lead tips over the right atrium and right ventricle. Cardiomegaly. 2. Increase interstitial markings noted bilaterally suggesting interstitial edema and or pneumonitis. Electronically Signed   By: Marcello Moores  Register   On: 08/20/2020 12:13     Assessment & Plan:   Right lower lobe pneumonia - Patient presents today with acute onset shortness of breath, fatigue and weakness x 1 day. Absent cough or fever. CXR imaging showed evidence of right side infiltrate, radiologist report not resulted. Had Dr. Vaughan Browner review. Clinically he looks stable. Lung sounds with rales/rhonchi to bilateral lungs R>L. O2 95% on 3L. Sending in prescription for Levaquin, prednisone taper and Albuterol hfa. Continue 3-4L oxygen at home. Encouraged patient to use IS and flutter valve. Stay well hydrated and get out of bed 2-3 times a day. Televisit in 1 week and fu with Dr. Vaughan Browner in January.   Recommendations: - Take antibiotic and prednisone (steriod) as prescribed - Sending in RX for rescue inhaler Albuterol - take 2 puffs every 4-6 hours as needed for shortness of breath - Use incentive spirometer every hours/ Use flutter valve 3 times a day - Stay well hydrated. Get out of bed 3 times a day, do not over exert your self - Use 3-4 L of oxygen at home or try removing extra tubing  - If symptoms acutely worsen or oxygen level sustaining <88% need to call EMS for ED evaluation   Rx: - Levaquin 500mg  1 tab daily x 7 days - Prednisone taper (4 tabs x 3 days;  3 tabs x 3 days;  2 tabs x 3 days; 1 tab x 3 days; then stop) - Albuterol 2 puffs every 4-6 hours for shortness of breath  Follow-up: - Televisit in 1 week with Beth NP    Martyn Ehrich, NP 08/20/2020

## 2020-08-21 ENCOUNTER — Other Ambulatory Visit: Payer: Self-pay

## 2020-08-21 NOTE — Telephone Encounter (Addendum)
Pt needs F2F for order for hospital bed for downstairs, he has pneumonia and is too SOB to go up & down stairs. Appt made for Monday

## 2020-08-24 ENCOUNTER — Telehealth: Payer: Self-pay

## 2020-08-24 ENCOUNTER — Encounter: Payer: Medicare Other | Admitting: Nurse Practitioner

## 2020-08-24 NOTE — Progress Notes (Signed)
Patient ID: Joshua [name], male   DOB: 07-06-1932, 86 y.o.   MRN: [id number]   appointment cancelled- they already have a hospital bed

## 2020-08-25 NOTE — Telephone Encounter (Signed)
Spoke with [name]- patients care giver.  States that he has protrusion on his left side that was first noticed yesterday.  States it is the size of a grapefruit.  There are no openings this week- can patient be worked in? If not please advise

## 2020-08-25 NOTE — Telephone Encounter (Signed)
Either schedule with me of tiffany

## 2020-08-26 ENCOUNTER — Telehealth: Payer: Self-pay | Admitting: Pulmonary Disease

## 2020-08-26 NOTE — Telephone Encounter (Signed)
Called and spoke with Patients daughter Joshua [name]. Joshua [name] stated Patient had shown some improvement, since Ov with Beth,NP.  Joshua [name] stated Patient was becoming more difficult to care for at home and asked questions about ALF. Encouraged Joshua [name] to keep tele visit with Beth,NP 08/28/20, so Eustaquio Maize can assess Patient improvement, and give recommendations. Understanding stated. Nothing further at this time.

## 2020-08-26 NOTE — Telephone Encounter (Signed)
Appt made for Video Visit. Family unable to get him to office for an in person appt

## 2020-08-27 ENCOUNTER — Telehealth (INDEPENDENT_AMBULATORY_CARE_PROVIDER_SITE_OTHER): Payer: Medicare Other | Admitting: Nurse Practitioner

## 2020-08-27 ENCOUNTER — Encounter: Payer: Self-pay | Admitting: Nurse Practitioner

## 2020-08-27 DIAGNOSIS — K5901 Slow transit constipation: Secondary | ICD-10-CM | POA: Diagnosis not present

## 2020-08-27 DIAGNOSIS — I25118 Atherosclerotic heart disease of native coronary artery with other forms of angina pectoris: Secondary | ICD-10-CM | POA: Diagnosis not present

## 2020-08-27 NOTE — Progress Notes (Signed)
Virtual Visit via telephone Note Due to COVID-19 pandemic this visit was conducted virtually. This visit type was conducted due to national recommendations for restrictions regarding the COVID-19 Pandemic (e.g. social distancing, sheltering in place) in an effort to limit this patient's exposure and mitigate transmission in our community. All issues noted in this document were discussed and addressed.  A physical exam was not performed with this format.  I connected with Joshua [name] on 08/27/20 at 12:12 by telephone and verified that I am speaking with the correct person using two identifiers. Joshua [name] is currently located at home and caregiver is currently with him during visit. The provider, Mary-Margaret Hassell Done, FNP is located in their office at time of visit.  I discussed the limitations, risks, security and privacy concerns of performing an evaluation and management service by telephone and the availability of in person appointments. I also discussed with the patient that there may be a patient responsible charge related to this service. The patient expressed understanding and agreed to proceed.   History and Present Illness:   Chief Complaint: knot on left flank  HPI Patient is bed ridden and cannot come to office. Patient is currently being treated for pneumonia at home. He is on oxygen and is very weak. He is on levaquin and prednisone. He is still coughing up some mucus. His current issue is he has not had a bowel movemnet since last Friday. He has a knot on his left side under his rib cage. The patient says he has had for several years, but caregiver says she has never seen it before. Has not changed in size an dhe denies any pain.   Review of Systems  Constitutional: Negative for diaphoresis and weight loss.  Eyes: Negative for blurred vision, double vision and pain.  Respiratory: Negative for shortness of breath.   Cardiovascular: Negative for chest pain,  palpitations, orthopnea and leg swelling.  Gastrointestinal: Negative for abdominal pain.  Skin: Negative for rash.  Neurological: Negative for dizziness, sensory change, loss of consciousness, weakness and headaches.  Endo/Heme/Allergies: Negative for polydipsia. Does not bruise/bleed easily.  Psychiatric/Behavioral: Negative for memory loss. The patient does not have insomnia.   All other systems reviewed and are negative.    Observations/Objective: Alert and oriented- answers all questions appropriately No distress    Assessment and Plan: Joshua [name] in today with chief complaint of No chief complaint on file.   1. Slow transit constipation If miralax is not working then may need to do enema Increase fiber in diet  * watch "Knot" on left flank needs to be sen if changes     Follow Up Instructions: prn    I discussed the assessment and treatment plan with the patient. The patient was provided an opportunity to ask questions and all were answered. The patient agreed with the plan and demonstrated an understanding of the instructions.   The patient was advised to call back or seek an in-person evaluation if the symptoms worsen or if the condition fails to improve as anticipated.  The above assessment and management plan was discussed with the patient. The patient verbalized understanding of and has agreed to the management plan. Patient is aware to call the clinic if symptoms persist or worsen. Patient is aware when to return to the clinic for a follow-up visit. Patient educated on when it is appropriate to go to the emergency department.   Time call ended:  12:25  I provided 15 minutes of  non-face-to-face time during this encounter.    Mary-Margaret Hassell Done, FNP

## 2020-08-28 ENCOUNTER — Other Ambulatory Visit: Payer: Self-pay

## 2020-08-28 ENCOUNTER — Encounter: Payer: Self-pay | Admitting: Primary Care

## 2020-08-28 ENCOUNTER — Ambulatory Visit (INDEPENDENT_AMBULATORY_CARE_PROVIDER_SITE_OTHER): Payer: Medicare Other | Admitting: Primary Care

## 2020-08-28 ENCOUNTER — Other Ambulatory Visit: Payer: Self-pay | Admitting: *Deleted

## 2020-08-28 DIAGNOSIS — J189 Pneumonia, unspecified organism: Secondary | ICD-10-CM | POA: Diagnosis not present

## 2020-08-28 DIAGNOSIS — J9611 Chronic respiratory failure with hypoxia: Secondary | ICD-10-CM | POA: Diagnosis not present

## 2020-08-28 MED ORDER — CLOPIDOGREL BISULFATE 75 MG PO TABS
75.0000 mg | ORAL_TABLET | Freq: Every day | ORAL | 0 refills | Status: DC
Start: 2020-08-28 — End: 2020-09-19

## 2020-08-28 NOTE — Telephone Encounter (Signed)
Fax from [facility] on 08/20/20 not received, resent

## 2020-08-28 NOTE — Patient Instructions (Addendum)
No changes CT chest scheduled for January 4th  Follow-up apt 10/03/19 with PFTs and Dr. Vaughan Browner

## 2020-08-28 NOTE — Progress Notes (Signed)
Virtual Visit via Telephone Note  I connected with Joshua [name] on 08/28/20 at 11:30 AM EST by telephone and verified that I am speaking with the correct person using two identifiers.  Location: Patient: Home Provider: Office   I discussed the limitations, risks, security and privacy concerns of performing an evaluation and management service by telephone and the availability of in person appointments. I also discussed with the patient that there may be a patient responsible charge related to this service. The patient expressed understanding and agreed to proceed.   History of Present Illness:  86 year old, former smoker quit in 1974 (30 pack year hx). PMH CAD status post PCI, chronic diastolic CHF, AV block requiring PPM, COPD, GERD, history of prostate treated with seed implant, history of bladder cancer, hypertension, hyperlipidemia, seizures (caused by intracranial bleed from AV malformation status post craniotomy in 1980)  Previous [facility] pulmoanry encounter: 07/21/20- Dr. Vaughan [name], Consult Admitted with hypoxia on 06/25/2020 thought to be secondary to community-acquired pneumonia, acute on chronic diastolic heart failure with concern for underlying ILD.  He was diuresed, given antibiotics.  CT scan showed some bronchiectasis with reticulation, fluid in the fissure.  Antibiotics were discontinued as it was felt that possibility of infection was not high and he was discharged on prednisone, supplemental oxygen.  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  No mold, hot tub, Jacuzzi.  No feather pillows or comforters Smoking history: 30-pack-year smoker.  Quit in 1970 Travel history: No significant travel history Relevant family history: No significant family history of lung disease  12/2/2021Volanda Napoleon, NP Patient presents today for increased sob and fatigue x 1 day. Patient of Dr. Matilde [name]. CT scan showed mild reticulation which is nonspecific  in no particular pattern. Ordered for additional labs including ANA, CCP, myositis panel and hypersensitivity panel. Needs repeat CT imaging and PFTs in 1-2 months.   He was brought to his appointment by EMS, accompanied by son. Family called them this morning d/t weakness/fatigue. Symptoms came on suddenly. Exam and vital signs appear stable. He wears 3L oxygen continuously. They have noticed O2 in 80s with exertion. He has long oxygen tubing at his house. He does not have a cough. Appetite has been ok. Denies f/c/s, confusion, purulent cough, chest tightness, wheezing, N/V/D.   08/28/2020 - interim hx  Patient contacted today for 1 week follow-up. He was treated for suspected pneumonia with Levaquin. Spoke with patient and his care taker Joshua [name]. He is feeling "fine". He has no residual sob or fatigue. Care taker reports that he has been doing a lot better the last three days. He has a new cough with some yellow mucus. He has been using 4L oxygen, O2 97% but drops into high 80s with ambulation. He has a lot of extra O2 tubing. He has not needed to use his rescue inhaler the last several days. He has a follow-up CT in January and OV with Dr. Vaughan [name] following this.    Observations/Objective:  - Patient able to speak without overt shortness of breath, wheezing or cough  Assessment and Plan:  CAP: - CXR on 08/20/20 reviewed by myself and Dr. Vaughan [name] which showed early signs of right side infiltrate - Treated with Levaquin 500mg  x 1 week and prednisone taper - Feels a great deal better. No residual sob/fatigue. New cough. - Continue flutter valve and IS  ILD: - He has HRCT and PFTs in January 2022 - ANA, HSP negative; CCP normal   Chronic  respiratory failure - Continue 3-4L oxygen at home to maintain O2 >88% - Try and remove any extra oxygen tubing if possible  Follow Up Instructions:  - FU January 14th with Dr. Vaughan [name]   I discussed the assessment and treatment plan with the patient. The  patient was provided an opportunity to ask questions and all were answered. The patient agreed with the plan and demonstrated an understanding of the instructions.   The patient was advised to call back or seek an in-person evaluation if the symptoms worsen or if the condition fails to improve as anticipated.  I provided 18 minutes of non-face-to-face time during this encounter.   Joshua Ehrich, NP

## 2020-08-31 ENCOUNTER — Other Ambulatory Visit: Payer: Self-pay | Admitting: Nurse Practitioner

## 2020-09-01 ENCOUNTER — Telehealth: Payer: Self-pay | Admitting: Interventional Cardiology

## 2020-09-01 ENCOUNTER — Telehealth: Payer: Self-pay

## 2020-09-01 ENCOUNTER — Telehealth: Payer: Self-pay | Admitting: Nurse Practitioner

## 2020-09-01 MED ORDER — SPIRONOLACTONE 25 MG PO TABS: 25.0000 mg | ORAL_TABLET | Freq: Every day | ORAL | 0 refills | Status: AC

## 2020-09-01 NOTE — Telephone Encounter (Signed)
Attempted to return phone call, no answer/ no voicemail.

## 2020-09-01 NOTE — Telephone Encounter (Signed)
Pt is running out of Spironolactone 25 mg and was told that [facility] will not be able to deliver before they run out. Would like for some to be sent to [facility]

## 2020-09-01 NOTE — Telephone Encounter (Signed)
Son is calling regarding a [employer] alert that goes around the patients neck, wanted to know if using would cause a conflict with his pacemaker. Came with a warning that stated if you have a pacemaker while using this device you should ask your Dr. before use. Please advise.

## 2020-09-01 NOTE — Telephone Encounter (Signed)
VO given for Plavix refill, there was a name discrepancy from our system as [name] & theirs as Joshua [name], so they were not receiving refill. His insurance card with [place] is listed as Joshua [name], name has been updated

## 2020-09-01 NOTE — Telephone Encounter (Signed)
Aware sent 30 d supply to [facility]

## 2020-09-02 NOTE — Telephone Encounter (Signed)
Notified that OK to use medic alert necklace with device.

## 2020-09-07 ENCOUNTER — Other Ambulatory Visit: Payer: Self-pay | Admitting: Nurse Practitioner

## 2020-09-08 ENCOUNTER — Telehealth: Payer: Self-pay

## 2020-09-08 NOTE — Telephone Encounter (Signed)
New order needs to be placed. Video visit made for 08/17/20 at 415

## 2020-09-08 NOTE — Telephone Encounter (Signed)
Pt needs a new PT order send to [facility] in [place].

## 2020-09-09 ENCOUNTER — Telehealth: Payer: Self-pay | Admitting: Pulmonary Disease

## 2020-09-09 ENCOUNTER — Telehealth: Payer: Self-pay

## 2020-09-09 ENCOUNTER — Ambulatory Visit (INDEPENDENT_AMBULATORY_CARE_PROVIDER_SITE_OTHER): Payer: Medicare Other

## 2020-09-09 DIAGNOSIS — I441 Atrioventricular block, second degree: Secondary | ICD-10-CM

## 2020-09-09 LAB — CUP PACEART REMOTE DEVICE CHECK
Battery Remaining Longevity: 81 mo
Battery Voltage: 2.99 V
Brady Statistic AP VP Percent: 43.03 %
Brady Statistic AP VS Percent: 0.32 %
Brady Statistic AS VP Percent: 53.73 %
Brady Statistic AS VS Percent: 2.93 %
Brady Statistic RA Percent Paced: 43.91 %
Brady Statistic RV Percent Paced: 96.7 %
Date Time Interrogation Session: 20211221184707
Implantable Lead Implant Date: 20190509
Implantable Lead Implant Date: 20190509
Implantable Lead Location: 753859
Implantable Lead Location: 753860
Implantable Lead Model: 3830
Implantable Lead Model: 5076
Implantable Pulse Generator Implant Date: 20190509
Lead Channel Impedance Value: 228 Ohm
Lead Channel Impedance Value: 285 Ohm
Lead Channel Impedance Value: 437 Ohm
Lead Channel Impedance Value: 437 Ohm
Lead Channel Pacing Threshold Amplitude: 1.125 V
Lead Channel Pacing Threshold Amplitude: 1.5 V
Lead Channel Pacing Threshold Pulse Width: 0.4 ms
Lead Channel Pacing Threshold Pulse Width: 0.4 ms
Lead Channel Sensing Intrinsic Amplitude: 0.625 mV
Lead Channel Sensing Intrinsic Amplitude: 0.625 mV
Lead Channel Sensing Intrinsic Amplitude: 2 mV
Lead Channel Sensing Intrinsic Amplitude: 2 mV
Lead Channel Setting Pacing Amplitude: 2.25 V
Lead Channel Setting Pacing Amplitude: 3.25 V
Lead Channel Setting Pacing Pulse Width: 0.4 ms
Lead Channel Setting Sensing Sensitivity: 0.9 mV

## 2020-09-09 MED ORDER — PHENYTOIN SODIUM EXTENDED 100 MG PO CAPS: 100.0000 mg | ORAL_CAPSULE | Freq: Four times a day (QID) | ORAL | 1 refills | Status: AC

## 2020-09-09 MED ORDER — AZITHROMYCIN 250 MG PO TABS: 250.0000 mg | ORAL_TABLET | ORAL | 0 refills | Status: DC

## 2020-09-09 MED ORDER — PREDNISONE 20 MG PO TABS: 20.0000 mg | ORAL_TABLET | Freq: Every day | ORAL | 0 refills | Status: DC

## 2020-09-09 NOTE — Telephone Encounter (Signed)
I would recommend taking 20mg  prednisone daily x 10 days. As opposed to 40mg  x5 days.

## 2020-09-09 NOTE — Telephone Encounter (Signed)
Pt's caregiver [name] states that when we call back with instructions from Dr. Vaughan Browner to call pt's son Lezlie Octave - [place] has given both inhalers - still has labored breathing and lot less strength than last week

## 2020-09-09 NOTE — Telephone Encounter (Signed)
Called and spoke with the pt's caregiver, Casandra Doffing  She states over the past 2-3 days pt having increased SOB and wheezing, lack of energy  She states that he is also coughing of dark green sputum  He is not having any fever or aches  He does get winded walking approx 5 steps and has to stop and rest  She states he finished his pred taper and levaquin 500 about 1 wk ago  Has had covid vax x 3  Please advise any recommendations for him, thank you!

## 2020-09-09 NOTE — Telephone Encounter (Signed)
Spoke with the pt's son, [name] and notified of response per [facility]  Rx for pred was sent  Pt to call if not able to tolerate

## 2020-09-09 NOTE — Telephone Encounter (Signed)
As per clinic visit from 12/10. He had improved with the previous course of prednisone and Levaquin  If he is worse now then send in a Z-Pak and prednisone 40 mg a day for 5 days. He'll need a repeat chest x-ray and follow-up in clinic if no improvement

## 2020-09-09 NOTE — Telephone Encounter (Signed)
Ok to do 90 day supply

## 2020-09-09 NOTE — Telephone Encounter (Signed)
I called and spoke with the pt's son, [name], ok per DPR and notified of response per Dr Vaughan Browner  He states that he does want [place] to go ahead and sent the zpack bc pt has been worse x 4 days  He states that the pt is not able to tolerate prednisone- last time he took his hands were so shaky he could not feed himself  He wants to know if there is something comparable to this  I went ahead and sent in the zpack  Sending to APP of the day since I was told Mannam not available this afternoon  Thanks!

## 2020-09-11 DIAGNOSIS — Z9981 Dependence on supplemental oxygen: Secondary | ICD-10-CM

## 2020-09-11 DIAGNOSIS — G40909 Epilepsy, unspecified, not intractable, without status epilepticus: Secondary | ICD-10-CM | POA: Diagnosis present

## 2020-09-11 DIAGNOSIS — Z961 Presence of intraocular lens: Secondary | ICD-10-CM | POA: Diagnosis present

## 2020-09-11 DIAGNOSIS — H9190 Unspecified hearing loss, unspecified ear: Secondary | ICD-10-CM | POA: Diagnosis present

## 2020-09-11 DIAGNOSIS — I5082 Biventricular heart failure: Secondary | ICD-10-CM | POA: Diagnosis present

## 2020-09-11 DIAGNOSIS — E785 Hyperlipidemia, unspecified: Secondary | ICD-10-CM | POA: Diagnosis present

## 2020-09-11 DIAGNOSIS — K2950 Unspecified chronic gastritis without bleeding: Secondary | ICD-10-CM | POA: Diagnosis present

## 2020-09-11 DIAGNOSIS — I2781 Cor pulmonale (chronic): Secondary | ICD-10-CM | POA: Diagnosis present

## 2020-09-11 DIAGNOSIS — N289 Disorder of kidney and ureter, unspecified: Secondary | ICD-10-CM | POA: Diagnosis present

## 2020-09-11 DIAGNOSIS — J84112 Idiopathic pulmonary fibrosis: Secondary | ICD-10-CM | POA: Diagnosis present

## 2020-09-11 DIAGNOSIS — E559 Vitamin D deficiency, unspecified: Secondary | ICD-10-CM | POA: Diagnosis present

## 2020-09-11 DIAGNOSIS — Z79899 Other long term (current) drug therapy: Secondary | ICD-10-CM

## 2020-09-11 DIAGNOSIS — Z7902 Long term (current) use of antithrombotics/antiplatelets: Secondary | ICD-10-CM

## 2020-09-11 DIAGNOSIS — Z955 Presence of coronary angioplasty implant and graft: Secondary | ICD-10-CM

## 2020-09-11 DIAGNOSIS — Z87891 Personal history of nicotine dependence: Secondary | ICD-10-CM

## 2020-09-11 DIAGNOSIS — Z20822 Contact with and (suspected) exposure to covid-19: Secondary | ICD-10-CM | POA: Diagnosis present

## 2020-09-11 DIAGNOSIS — Z8679 Personal history of other diseases of the circulatory system: Secondary | ICD-10-CM

## 2020-09-11 DIAGNOSIS — I2510 Atherosclerotic heart disease of native coronary artery without angina pectoris: Secondary | ICD-10-CM | POA: Diagnosis present

## 2020-09-11 DIAGNOSIS — Z950 Presence of cardiac pacemaker: Secondary | ICD-10-CM

## 2020-09-11 DIAGNOSIS — Z888 Allergy status to other drugs, medicaments and biological substances status: Secondary | ICD-10-CM

## 2020-09-11 DIAGNOSIS — Z9841 Cataract extraction status, right eye: Secondary | ICD-10-CM

## 2020-09-11 DIAGNOSIS — J9621 Acute and chronic respiratory failure with hypoxia: Secondary | ICD-10-CM | POA: Diagnosis present

## 2020-09-11 DIAGNOSIS — M1990 Unspecified osteoarthritis, unspecified site: Secondary | ICD-10-CM | POA: Diagnosis present

## 2020-09-11 DIAGNOSIS — Z8546 Personal history of malignant neoplasm of prostate: Secondary | ICD-10-CM

## 2020-09-11 DIAGNOSIS — K5903 Drug induced constipation: Secondary | ICD-10-CM | POA: Diagnosis present

## 2020-09-11 DIAGNOSIS — I110 Hypertensive heart disease with heart failure: Principal | ICD-10-CM | POA: Diagnosis present

## 2020-09-11 DIAGNOSIS — Z8551 Personal history of malignant neoplasm of bladder: Secondary | ICD-10-CM

## 2020-09-11 DIAGNOSIS — N529 Male erectile dysfunction, unspecified: Secondary | ICD-10-CM | POA: Diagnosis present

## 2020-09-11 DIAGNOSIS — J439 Emphysema, unspecified: Secondary | ICD-10-CM | POA: Diagnosis present

## 2020-09-11 DIAGNOSIS — Z923 Personal history of irradiation: Secondary | ICD-10-CM

## 2020-09-11 DIAGNOSIS — I252 Old myocardial infarction: Secondary | ICD-10-CM

## 2020-09-11 DIAGNOSIS — G2581 Restless legs syndrome: Secondary | ICD-10-CM | POA: Diagnosis present

## 2020-09-11 DIAGNOSIS — K219 Gastro-esophageal reflux disease without esophagitis: Secondary | ICD-10-CM | POA: Diagnosis present

## 2020-09-11 DIAGNOSIS — R532 Functional quadriplegia: Secondary | ICD-10-CM | POA: Diagnosis present

## 2020-09-11 DIAGNOSIS — Z825 Family history of asthma and other chronic lower respiratory diseases: Secondary | ICD-10-CM

## 2020-09-11 DIAGNOSIS — Z7982 Long term (current) use of aspirin: Secondary | ICD-10-CM

## 2020-09-11 DIAGNOSIS — R7989 Other specified abnormal findings of blood chemistry: Secondary | ICD-10-CM | POA: Diagnosis present

## 2020-09-11 DIAGNOSIS — I5043 Acute on chronic combined systolic (congestive) and diastolic (congestive) heart failure: Secondary | ICD-10-CM | POA: Diagnosis present

## 2020-09-11 DIAGNOSIS — T471X5A Adverse effect of other antacids and anti-gastric-secretion drugs, initial encounter: Secondary | ICD-10-CM | POA: Diagnosis present

## 2020-09-11 DIAGNOSIS — Z7952 Long term (current) use of systemic steroids: Secondary | ICD-10-CM

## 2020-09-11 DIAGNOSIS — D539 Nutritional anemia, unspecified: Secondary | ICD-10-CM | POA: Diagnosis present

## 2020-09-11 DIAGNOSIS — H3530 Unspecified macular degeneration: Secondary | ICD-10-CM | POA: Diagnosis present

## 2020-09-11 DIAGNOSIS — D509 Iron deficiency anemia, unspecified: Secondary | ICD-10-CM | POA: Diagnosis present

## 2020-09-11 DIAGNOSIS — K921 Melena: Secondary | ICD-10-CM | POA: Diagnosis present

## 2020-09-11 DIAGNOSIS — E876 Hypokalemia: Secondary | ICD-10-CM | POA: Diagnosis not present

## 2020-09-11 DIAGNOSIS — K8020 Calculus of gallbladder without cholecystitis without obstruction: Secondary | ICD-10-CM | POA: Diagnosis present

## 2020-09-11 DIAGNOSIS — M609 Myositis, unspecified: Secondary | ICD-10-CM | POA: Diagnosis present

## 2020-09-11 DIAGNOSIS — I2729 Other secondary pulmonary hypertension: Secondary | ICD-10-CM | POA: Diagnosis present

## 2020-09-11 DIAGNOSIS — Z880 Allergy status to penicillin: Secondary | ICD-10-CM

## 2020-09-11 DIAGNOSIS — Z86010 Personal history of colonic polyps: Secondary | ICD-10-CM

## 2020-09-11 DIAGNOSIS — K31819 Angiodysplasia of stomach and duodenum without bleeding: Secondary | ICD-10-CM | POA: Diagnosis present

## 2020-09-11 DIAGNOSIS — I440 Atrioventricular block, first degree: Secondary | ICD-10-CM | POA: Diagnosis present

## 2020-09-11 DIAGNOSIS — Z9842 Cataract extraction status, left eye: Secondary | ICD-10-CM

## 2020-09-11 DIAGNOSIS — J690 Pneumonitis due to inhalation of food and vomit: Secondary | ICD-10-CM | POA: Diagnosis present

## 2020-09-11 DIAGNOSIS — I441 Atrioventricular block, second degree: Secondary | ICD-10-CM | POA: Diagnosis present

## 2020-09-12 ENCOUNTER — Inpatient Hospital Stay (HOSPITAL_COMMUNITY)
Admission: EM | Admit: 2020-09-12 | Discharge: 2020-09-18 | DRG: 291 | Disposition: A | Discharge status: Skilled Nursing Facility | Payer: Medicare Other | Attending: Internal Medicine | Admitting: Internal Medicine

## 2020-09-12 ENCOUNTER — Emergency Department (HOSPITAL_COMMUNITY): Payer: Medicare Other

## 2020-09-12 ENCOUNTER — Other Ambulatory Visit: Payer: Self-pay

## 2020-09-12 ENCOUNTER — Encounter (HOSPITAL_COMMUNITY): Payer: Self-pay | Admitting: Emergency Medicine

## 2020-09-12 DIAGNOSIS — E7800 Pure hypercholesterolemia, unspecified: Secondary | ICD-10-CM | POA: Diagnosis present

## 2020-09-12 DIAGNOSIS — I2510 Atherosclerotic heart disease of native coronary artery without angina pectoris: Secondary | ICD-10-CM | POA: Diagnosis present

## 2020-09-12 DIAGNOSIS — I5033 Acute on chronic diastolic (congestive) heart failure: Secondary | ICD-10-CM | POA: Diagnosis present

## 2020-09-12 DIAGNOSIS — K219 Gastro-esophageal reflux disease without esophagitis: Secondary | ICD-10-CM | POA: Diagnosis present

## 2020-09-12 DIAGNOSIS — I5023 Acute on chronic systolic (congestive) heart failure: Secondary | ICD-10-CM

## 2020-09-12 DIAGNOSIS — K921 Melena: Secondary | ICD-10-CM

## 2020-09-12 DIAGNOSIS — D649 Anemia, unspecified: Secondary | ICD-10-CM | POA: Diagnosis not present

## 2020-09-12 DIAGNOSIS — R195 Other fecal abnormalities: Secondary | ICD-10-CM | POA: Diagnosis present

## 2020-09-12 DIAGNOSIS — R0602 Shortness of breath: Secondary | ICD-10-CM | POA: Diagnosis not present

## 2020-09-12 DIAGNOSIS — J811 Chronic pulmonary edema: Secondary | ICD-10-CM

## 2020-09-12 DIAGNOSIS — I110 Hypertensive heart disease with heart failure: Secondary | ICD-10-CM | POA: Diagnosis not present

## 2020-09-12 DIAGNOSIS — R569 Unspecified convulsions: Secondary | ICD-10-CM

## 2020-09-12 DIAGNOSIS — I10 Essential (primary) hypertension: Secondary | ICD-10-CM | POA: Diagnosis present

## 2020-09-12 DIAGNOSIS — J9611 Chronic respiratory failure with hypoxia: Secondary | ICD-10-CM

## 2020-09-12 DIAGNOSIS — J9621 Acute and chronic respiratory failure with hypoxia: Secondary | ICD-10-CM | POA: Diagnosis not present

## 2020-09-12 DIAGNOSIS — D539 Nutritional anemia, unspecified: Secondary | ICD-10-CM | POA: Diagnosis present

## 2020-09-12 DIAGNOSIS — I509 Heart failure, unspecified: Secondary | ICD-10-CM

## 2020-09-12 DIAGNOSIS — R532 Functional quadriplegia: Secondary | ICD-10-CM | POA: Diagnosis present

## 2020-09-12 LAB — CBC WITH DIFFERENTIAL/PLATELET
Abs Immature Granulocytes: 0.09 10*3/uL — ABNORMAL HIGH (ref 0.00–0.07)
Basophils Absolute: 0.1 10*3/uL (ref 0.0–0.1)
Basophils Relative: 1 %
Eosinophils Absolute: 0.1 10*3/uL (ref 0.0–0.5)
Eosinophils Relative: 1 %
HCT: 26.9 % — ABNORMAL LOW (ref 39.0–52.0)
Hemoglobin: 8.3 g/dL — ABNORMAL LOW (ref 13.0–17.0)
Immature Granulocytes: 1 %
Lymphocytes Relative: 8 %
Lymphs Abs: 1.1 10*3/uL (ref 0.7–4.0)
MCH: 32.5 pg (ref 26.0–34.0)
MCHC: 30.9 g/dL (ref 30.0–36.0)
MCV: 105.5 fL — ABNORMAL HIGH (ref 80.0–100.0)
Monocytes Absolute: 2 10*3/uL — ABNORMAL HIGH (ref 0.1–1.0)
Monocytes Relative: 15 %
Neutro Abs: 9.8 10*3/uL — ABNORMAL HIGH (ref 1.7–7.7)
Neutrophils Relative %: 74 %
Platelets: 318 10*3/uL (ref 150–400)
RBC: 2.55 MIL/uL — ABNORMAL LOW (ref 4.22–5.81)
RDW: 21.2 % — ABNORMAL HIGH (ref 11.5–15.5)
WBC: 13.2 10*3/uL — ABNORMAL HIGH (ref 4.0–10.5)
nRBC: 3.2 % — ABNORMAL HIGH (ref 0.0–0.2)

## 2020-09-12 LAB — HEPATIC FUNCTION PANEL
ALT: 72 U/L — ABNORMAL HIGH (ref 0–44)
AST: 66 U/L — ABNORMAL HIGH (ref 15–41)
Albumin: 3.4 g/dL — ABNORMAL LOW (ref 3.5–5.0)
Alkaline Phosphatase: 130 U/L — ABNORMAL HIGH (ref 38–126)
Bilirubin, Direct: 0.2 mg/dL (ref 0.0–0.2)
Indirect Bilirubin: 0.5 mg/dL (ref 0.3–0.9)
Total Bilirubin: 0.7 mg/dL (ref 0.3–1.2)
Total Protein: 6 g/dL — ABNORMAL LOW (ref 6.5–8.1)

## 2020-09-12 LAB — PHENYTOIN LEVEL, TOTAL: Phenytoin Lvl: 17.8 ug/mL (ref 10.0–20.0)

## 2020-09-12 LAB — RESP PANEL BY RT-PCR (FLU A&B, COVID) ARPGX2
Influenza A by PCR: NEGATIVE
Influenza B by PCR: NEGATIVE
SARS Coronavirus 2 by RT PCR: NEGATIVE

## 2020-09-12 LAB — BASIC METABOLIC PANEL
Anion gap: 10 (ref 5–15)
BUN: 43 mg/dL — ABNORMAL HIGH (ref 8–23)
CO2: 25 mmol/L (ref 22–32)
Calcium: 9.1 mg/dL (ref 8.9–10.3)
Chloride: 102 mmol/L (ref 98–111)
Creatinine, Ser: 0.98 mg/dL (ref 0.61–1.24)
GFR, Estimated: >60 mL/min (ref >60)
Glucose, Bld: 181 mg/dL — ABNORMAL HIGH (ref 70–99)
Potassium: 4.2 mmol/L (ref 3.5–5.1)
Sodium: 137 mmol/L (ref 135–145)

## 2020-09-12 LAB — CBC
HCT: 24.9 % — ABNORMAL LOW (ref 39.0–52.0)
Hemoglobin: 7.2 g/dL — ABNORMAL LOW (ref 13.0–17.0)
MCH: 32.4 pg (ref 26.0–34.0)
MCHC: 28.9 g/dL — ABNORMAL LOW (ref 30.0–36.0)
MCV: 112.2 fL — ABNORMAL HIGH (ref 80.0–100.0)
Platelets: 267 10*3/uL (ref 150–400)
RBC: 2.22 MIL/uL — ABNORMAL LOW (ref 4.22–5.81)
RDW: 21.2 % — ABNORMAL HIGH (ref 11.5–15.5)
WBC: 10.7 10*3/uL — ABNORMAL HIGH (ref 4.0–10.5)
nRBC: 1.5 % — ABNORMAL HIGH (ref 0.0–0.2)

## 2020-09-12 LAB — PROTIME-INR
INR: 1.3 — ABNORMAL HIGH (ref 0.8–1.2)
Prothrombin Time: 15.4 seconds — ABNORMAL HIGH (ref 11.4–15.2)

## 2020-09-12 LAB — RETICULOCYTES
Immature Retic Fract: 33.2 % — ABNORMAL HIGH (ref 2.3–15.9)
RBC.: 2.23 MIL/uL — ABNORMAL LOW (ref 4.22–5.81)
Retic Count, Absolute: 133.8 10*3/uL (ref 19.0–186.0)
Retic Ct Pct: 6 % — ABNORMAL HIGH (ref 0.4–3.1)

## 2020-09-12 LAB — VITAMIN B12: Vitamin B-12: 520 pg/mL (ref 180–914)

## 2020-09-12 LAB — IRON AND TIBC
Iron: 39 ug/dL — ABNORMAL LOW (ref 45–182)
Saturation Ratios: 14 % — ABNORMAL LOW (ref 17.9–39.5)
TIBC: 277 ug/dL (ref 250–450)
UIBC: 238 ug/dL

## 2020-09-12 LAB — POC OCCULT BLOOD, ED: Fecal Occult Bld: POSITIVE — AB

## 2020-09-12 LAB — FOLATE: Folate: 7.4 ng/mL (ref >5.9)

## 2020-09-12 LAB — HEMOGLOBIN: Hemoglobin: 7.1 g/dL — ABNORMAL LOW (ref 13.0–17.0)

## 2020-09-12 LAB — MAGNESIUM: Magnesium: 2.4 mg/dL (ref 1.7–2.4)

## 2020-09-12 LAB — APTT: aPTT: 34 seconds (ref 24–36)

## 2020-09-12 LAB — FERRITIN: Ferritin: 67 ng/mL (ref 24–336)

## 2020-09-12 LAB — PHENOBARBITAL LEVEL: Phenobarbital: 30.1 ug/mL — ABNORMAL HIGH (ref 15.0–30.0)

## 2020-09-12 LAB — BRAIN NATRIURETIC PEPTIDE: B Natriuretic Peptide: 983 pg/mL — ABNORMAL HIGH (ref 0.0–100.0)

## 2020-09-12 MED ORDER — ACETAMINOPHEN 325 MG PO TABS
650.0000 mg | ORAL_TABLET | Freq: Four times a day (QID) | ORAL | Status: DC | PRN
  Administered 2020-09-13: 650 mg via ORAL
  Filled 2020-09-12: qty 2

## 2020-09-12 MED ORDER — ALBUTEROL SULFATE (2.5 MG/3ML) 0.083% IN NEBU: 3.0000 mL | INHALATION_SOLUTION | Freq: Four times a day (QID) | RESPIRATORY_TRACT | Status: DC | PRN

## 2020-09-12 MED ORDER — ATORVASTATIN CALCIUM 10 MG PO TABS
20.0000 mg | ORAL_TABLET | Freq: Every evening | ORAL | Status: DC
  Administered 2020-09-12 – 2020-09-18 (×6): 20 mg via ORAL
  Filled 2020-09-12: qty 2
  Filled 2020-09-12: qty 1
  Filled 2020-09-12 (×2): qty 2
  Filled 2020-09-12: qty 1
  Filled 2020-09-12 (×3): qty 2

## 2020-09-12 MED ORDER — SPIRONOLACTONE 25 MG PO TABS
25.0000 mg | ORAL_TABLET | Freq: Every day | ORAL | Status: DC
  Administered 2020-09-13 – 2020-09-14 (×2): 25 mg via ORAL
  Filled 2020-09-12 (×4): qty 1

## 2020-09-12 MED ORDER — FERROUS SULFATE 325 (65 FE) MG PO TABS
325.0000 mg | ORAL_TABLET | Freq: Every day | ORAL | Status: DC
  Administered 2020-09-12 – 2020-09-14 (×3): 325 mg via ORAL
  Filled 2020-09-12 (×3): qty 1

## 2020-09-12 MED ORDER — POLYVINYL ALCOHOL 1.4 % OP SOLN
2.0000 [drp] | Freq: Every day | OPHTHALMIC | Status: DC | PRN
  Filled 2020-09-12: qty 15

## 2020-09-12 MED ORDER — OCUVITE-LUTEIN PO CAPS
1.0000 | ORAL_CAPSULE | Freq: Two times a day (BID) | ORAL | Status: DC
  Administered 2020-09-12 – 2020-09-13 (×2): 1 via ORAL
  Filled 2020-09-12 (×6): qty 1

## 2020-09-12 MED ORDER — CITALOPRAM HYDROBROMIDE 20 MG PO TABS
20.0000 mg | ORAL_TABLET | Freq: Every day | ORAL | Status: DC
Start: 2020-09-12 — End: 2020-09-19
  Administered 2020-09-13 – 2020-09-18 (×5): 20 mg via ORAL
  Filled 2020-09-12 (×8): qty 1

## 2020-09-12 MED ORDER — LISINOPRIL 5 MG PO TABS
2.5000 mg | ORAL_TABLET | Freq: Every day | ORAL | Status: DC
  Administered 2020-09-12 – 2020-09-14 (×3): 2.5 mg via ORAL
  Filled 2020-09-12 (×3): qty 1

## 2020-09-12 MED ORDER — PANTOPRAZOLE SODIUM 40 MG IV SOLR
40.0000 mg | Freq: Once | INTRAVENOUS | Status: AC
  Administered 2020-09-12: 01:00:00 40 mg via INTRAVENOUS
  Filled 2020-09-12: qty 40

## 2020-09-12 MED ORDER — ACETAMINOPHEN 650 MG RE SUPP: 650.0000 mg | Freq: Four times a day (QID) | RECTAL | Status: DC | PRN

## 2020-09-12 MED ORDER — PANTOPRAZOLE SODIUM 40 MG IV SOLR
40.0000 mg | Freq: Two times a day (BID) | INTRAVENOUS | Status: DC
  Administered 2020-09-12 – 2020-09-18 (×12): 40 mg via INTRAVENOUS
  Filled 2020-09-12 (×12): qty 40

## 2020-09-12 MED ORDER — PHENYTOIN SODIUM EXTENDED 100 MG PO CAPS
100.0000 mg | ORAL_CAPSULE | Freq: Three times a day (TID) | ORAL | Status: DC
  Administered 2020-09-12: 10:00:00 100 mg via ORAL
  Filled 2020-09-12: qty 1

## 2020-09-12 MED ORDER — ONDANSETRON HCL 4 MG/2ML IJ SOLN
4.0000 mg | Freq: Four times a day (QID) | INTRAMUSCULAR | Status: DC | PRN
  Administered 2020-09-14: 23:00:00 4 mg via INTRAVENOUS
  Filled 2020-09-12: qty 2

## 2020-09-12 MED ORDER — CHLORHEXIDINE GLUCONATE CLOTH 2 % EX PADS
6.0000 | MEDICATED_PAD | Freq: Every day | CUTANEOUS | Status: DC
  Administered 2020-09-12 – 2020-09-13 (×2): 6 via TOPICAL

## 2020-09-12 MED ORDER — VITAMIN D 25 MCG (1000 UNIT) PO TABS
2000.0000 [IU] | ORAL_TABLET | Freq: Every day | ORAL | Status: DC
  Administered 2020-09-13 – 2020-09-18 (×5): 2000 [IU] via ORAL
  Filled 2020-09-12 (×9): qty 2

## 2020-09-12 MED ORDER — FUROSEMIDE 10 MG/ML IJ SOLN
20.0000 mg | Freq: Every day | INTRAMUSCULAR | Status: DC
  Administered 2020-09-13 – 2020-09-14 (×2): 20 mg via INTRAVENOUS
  Filled 2020-09-12 (×2): qty 2

## 2020-09-12 MED ORDER — CALCIUM CARBONATE 1250 (500 CA) MG PO TABS
500.0000 mg | ORAL_TABLET | Freq: Every day | ORAL | Status: DC
  Administered 2020-09-13 – 2020-09-18 (×5): 500 mg via ORAL
  Filled 2020-09-12 (×7): qty 1

## 2020-09-12 MED ORDER — PANTOPRAZOLE SODIUM 40 MG IV SOLR: 40.0000 mg | Freq: Once | INTRAVENOUS | Status: DC

## 2020-09-12 MED ORDER — FUROSEMIDE 10 MG/ML IJ SOLN
40.0000 mg | Freq: Once | INTRAMUSCULAR | Status: AC
  Administered 2020-09-12: 01:00:00 40 mg via INTRAVENOUS
  Filled 2020-09-12: qty 4

## 2020-09-12 MED ORDER — PHENYTOIN SODIUM EXTENDED 100 MG PO CAPS
100.0000 mg | ORAL_CAPSULE | Freq: Three times a day (TID) | ORAL | Status: DC
  Administered 2020-09-12 – 2020-09-18 (×24): 100 mg via ORAL
  Filled 2020-09-12 (×30): qty 1

## 2020-09-12 MED ORDER — ONDANSETRON HCL 4 MG PO TABS: 4.0000 mg | ORAL_TABLET | Freq: Four times a day (QID) | ORAL | Status: DC | PRN

## 2020-09-12 MED ORDER — FUROSEMIDE 10 MG/ML IJ SOLN: 20.0000 mg | Freq: Two times a day (BID) | INTRAMUSCULAR | Status: DC

## 2020-09-12 MED ORDER — PHENOBARBITAL 32.4 MG PO TABS
64.8000 mg | ORAL_TABLET | Freq: Two times a day (BID) | ORAL | Status: DC
  Administered 2020-09-12 – 2020-09-18 (×13): 64.8 mg via ORAL
  Filled 2020-09-12 (×12): qty 2

## 2020-09-12 NOTE — ED Provider Notes (Addendum)
[facility] EMERGENCY DEPARTMENT Provider Note   CSN: [id number] Arrival date & time: 09/11/20  2359     History Chief Complaint  Patient presents with  . Shortness of Breath    Joshua [name] is a 86 y.o. male.  HPI     This is an 86 year old male with a history of coronary artery disease, chronic respiratory failure on 2 to 3 L of home oxygen, bladder cancer who presents with shortness of breath.  Patient reports that he has had shortness of breath for "some time."  He reports worsening shortness of breath over the last week or 2.  He was restarted on prednisone and azithromycin by his pulmonologist.  Earlier in December he finished a course of Levaquin for pneumonia.  Patient reports that he used to be able to walk a few steps with his oxygen on without getting winded.  However, now "I just cannot get around."  Denies any dizziness.  No recent fevers or cough.  No known sick contacts or Covid exposures.  He is fully vaccinated.  At baseline he reports dark stools since taking iron.  Has not noted any orthopnea or chest pain.  Past Medical History:  Diagnosis Date  . 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 Arthritis   . Bilateral lower extremity edema   . Bladder tumor   . CAD (coronary artery disease) cardiologist--  dr Daneen Schick   a. 04/27/2016 PCI with DES to RCA with 50% ostial to 60% segmental mid to distal left main, and 90% thrombus filled ostial to proximal circumflex, with distal right coronary filled by collaterals from left-to-right  . COPD with emphysema ([place])   . Dyspnea on exertion   . ED (erectile dysfunction)   . First degree AV block   . GERD (gastroesophageal reflux disease)   . History of adenomatous polyp of colon    09-22-2008  . History of bladder cancer    s/p  resection bladder tumor 04-19-2016  non-invasive low-grade urothelial carcinoma  . History of prostate cancer urologist-  dr Jeffie Pollock-- last PSA  0.02 (summer 2017)   dx 02/ 2013-- Stage T2a, Gleason 7, PSA 1.58--  s/p  radioactive prostate seed implants 01-19-2012  . History of ST elevation myocardial infarction (STEMI)    04-27-2016  . Hyperlipidemia   . Hypertension   . Macular degeneration   . Presence of permanent cardiac pacemaker   . S/P drug eluting coronary stent placement    04-27-2016   . Second degree Mobitz I AV block    occasional w/ first degree heart block  per cardiologist note by dr Daneen Schick  . Seizures ([place]) per pt son -- no seizure's since 1980   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  arteriorvenous cerebral malformation s/p  craniotomy 1980  . Vitamin D deficiency     Patient Active Problem List   Diagnosis Date Noted  . Right lower lobe pneumonia 06/22/2020  . CHF exacerbation ([place]) 06/22/2020  . Elevated troponin 06/22/2020  . Chronic diastolic heart failure ([place]) 11/25/2019  . High risk medication use 07/19/2019  . Pseudophakia, both eyes 09/20/2018  . Exudative age-related macular degeneration ([place]) 09/20/2018  . Second degree AV block 06/15/2017  . Aortic atherosclerosis ([place]) 06/03/2016  . General weakness   . Old MI (myocardial infarction) 04/27/2016  . Bladder cancer ([place]) 04/18/2016  . Vitamin D deficiency 10/15/2015  . Prostate cancer ([place])   . Hypercholesterolemia   .  Essential hypertension   . Seizures ([place])   . GERD 08/22/2008  . COLONIC POLYPS, ADENOMATOUS, HX OF 08/22/2008    Past Surgical History:  Procedure Laterality Date  . APPENDECTOMY  2011  . BLEPHAROPLASTY Bilateral revision 02-17-2016  . CARDIAC CATHETERIZATION N/A 04/27/2016   Procedure: Left Heart Cath and Coronary Angiography;  Surgeon: Belva Crome, MD;  Location: [place] [facility];  Service: Cardiovascular;  Laterality: N/A;  acute inferior wall STEMI ;  ostCx to midCx 90%,  ost1st Mrg to 1st Mrg 50%,  mid to distal RCA 100%,  pRCA 80%,  LVEF 45-50% w/ inferobasal akinesis  . CARDIAC CATHETERIZATION  N/A 04/27/2016   Procedure: Temporary Pacemaker;  Surgeon: Belva Crome, MD;  Location: [place] [facility];  Service: Cardiovascular;  Laterality: N/A;  mobitz 2  second degree HB w/ heart rate 29bpm  . CARDIAC CATHETERIZATION N/A 04/27/2016   Procedure: Coronary Stent Intervention;  Surgeon: Belva Crome, MD;  Location: [place] [facility];  Service: Cardiovascular;  Laterality: N/A; DES x1 to  Proximal RCA;  DES x1 to Mid RCA  . CATARACT EXTRACTION W/ INTRAOCULAR LENS  IMPLANT, BILATERAL  2016  approx.  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 CYSTOSCOPY  01/19/2012   Procedure: CYSTOSCOPY;  Surgeon: Malka So, MD;  Location: [facility];  Service: Urology;;  no seeds found in bladder  . CYSTOSCOPY W/ RETROGRADES Bilateral 04/19/2016   Procedure: CYSTOSCOPY WITH BILATERAL RETROGRADE PYELOGRAM TRANSURETHRAL RESECTION OF BLADDER TUMOR ;  Surgeon: [name], MD;  Location: [facility];  Service: Urology;  Laterality: Bilateral;  . CYSTOSCOPY WITH BIOPSY N/A 11/10/2016   Procedure: CYSTOSCOPY WITH BIOPSY AND FULGURATION;  Surgeon: [name], MD;  Location: [facility];  Service: Urology;  Laterality: N/A;  . INSERT / REPLACE / REMOVE PACEMAKER  06/15/2017  . LEAD EXTRACTION N/A 01/22/2018   Procedure: LEAD EXTRACTION;  Surgeon: Evans Lance, MD;  Location: [place] [facility];  Service: Cardiovascular;  Laterality: N/A;  . PACEMAKER IMPLANT N/A 06/15/2017   Procedure: Pacemaker Implant;  Surgeon: [name], MD;  Location: [place] [facility];  Service: Cardiovascular;  Laterality: N/A;  . PACEMAKER IMPLANT N/A 01/25/2018   Procedure: PACEMAKER IMPLANT;  Surgeon: Evans Lance, MD;  Location: [place] [facility];  Service: Cardiovascular;  Laterality: N/A;  . PPM GENERATOR REMOVAL N/A 01/22/2018   Procedure: PPM GENERATOR REMOVAL;  Surgeon: Evans Lance, MD;  Location: [place] [facility];  Service: Cardiovascular;   Laterality: N/A;  . RADIOACTIVE SEED IMPLANT  01/19/2012   Procedure: RADIOACTIVE SEED IMPLANT;  Surgeon: Malka So, MD;  Location: [facility];  Service: Urology;  Laterality: N/A;  68 seeds implanted  . TRANSTHORACIC ECHOCARDIOGRAM  06/02/2016   ef 60-65%/  trivial MR/  mild TR       Family History  Problem Relation Age of Onset  . Emphysema Mother        age 66 deceased  . Stroke Mother   . Emphysema Father        deceased age 2    Social History   Tobacco Use  . Smoking status: Former Smoker    Packs/day: 2.00    Years: 15.00    Pack years: 30.00    Quit date: 01/15/1973    Years since quitting: 47.6  . Smokeless tobacco: Never Used  Vaping Use  . Vaping Use: Never  used  Substance Use Topics  . Alcohol use: Yes    Comment: occasionally  . Drug use: No    Home Medications Prior to Admission medications   Medication Sig Start Date End Date Taking? Authorizing Provider  albuterol (VENTOLIN HFA) 108 (90 Base) MCG/ACT inhaler Inhale 1-2 puffs into the lungs every 6 (six) hours as needed for wheezing or shortness of breath. 08/20/20   Martyn Ehrich, NP  aspirin EC 81 MG tablet Take 81 mg by mouth daily. Swallow whole.    [provider]  atorvastatin (LIPITOR) 20 MG tablet Take 1 tablet (20 mg total) by mouth daily. 07/01/20   Hassell Done, Mary-Margaret, FNP  azithromycin (ZITHROMAX) 250 MG tablet Take 1 tablet (250 mg total) by mouth as directed. 09/09/20   Mannam, Hart Robinsons, MD  calcium carbonate (OSCAL) 1500 (600 Ca) MG TABS tablet Take 600 mg of elemental calcium by mouth daily with breakfast.    [provider]  Cholecalciferol (VITAMIN D3) 50 MCG (2000 UT) capsule Take 2,000 Units by mouth daily.     [provider]  citalopram (CELEXA) 20 MG tablet TAKE 1 TABLET DAILY 08/31/20   Hassell Done, Mary-Margaret, FNP  clopidogrel (PLAVIX) 75 MG tablet Take 1 tablet (75 mg total) by mouth daily. 08/28/20   Hassell Done, Mary-Margaret, FNP   famotidine (PEPCID) 20 MG tablet Take 20 mg by mouth daily.     [provider]  ferrous sulfate 325 (65 FE) MG tablet Take 1 tablet (325 mg total) by mouth daily with breakfast. 10/31/18   Gatha Mayer, MD  furosemide (LASIX) 40 MG tablet Take 0.5 tablets (20 mg total) by mouth daily. 06/26/20 07/26/20  Raiford Noble Latif, DO  levofloxacin (LEVAQUIN) 500 MG tablet Take 1 tablet (500 mg total) by mouth daily. 08/20/20   Martyn Ehrich, NP  MAGNESIUM PO Take 1 tablet by mouth at bedtime.    [provider]  Multiple Vitamins-Minerals (PRESERVISION AREDS 2) CAPS Take 1 capsule by mouth 2 (two) times daily.     [provider]  PHENobarbital (LUMINAL) 64.8 MG tablet Take 1 tablet (64.8 mg total) by mouth 2 (two) times daily. 07/01/20   Hassell Done, Mary-Margaret, FNP  phenytoin (DILANTIN) 100 MG ER capsule Take 1 capsule (100 mg total) by mouth in the morning, at noon, in the evening, and at bedtime. 09/09/20   Hassell Done, Mary-Margaret, FNP  polyvinyl alcohol (LIQUIFILM TEARS) 1.4 % ophthalmic solution Place 2 drops into both eyes daily as needed for dry eyes.     [provider]  predniSONE (DELTASONE) 10 MG tablet Take 4 tabs po daily x 3 days; then 3 tabs daily x3 days; then 2 tabs daily x3 days; then 1 tab daily x 3 days; then stop 08/20/20   Martyn Ehrich, NP  predniSONE (DELTASONE) 20 MG tablet Take 1 tablet (20 mg total) by mouth daily with breakfast. 09/09/20   Martyn Ehrich, NP  spironolactone (ALDACTONE) 25 MG tablet Take 1 tablet (25 mg total) by mouth daily. 09/01/20   Hassell Done Mary-Margaret, FNP    Allergies    Penicillins and Prednisone  Review of Systems   Review of Systems  Constitutional: Negative for fever.  Respiratory: Positive for shortness of breath. Negative for cough.   Cardiovascular: Negative for chest pain.  Gastrointestinal: Negative for abdominal pain, diarrhea, nausea and vomiting.  Genitourinary: Negative for dysuria.   Musculoskeletal: Negative for back pain.  Neurological: Negative for light-headedness.  All other systems reviewed and are negative.  Physical Exam Updated Vital Signs BP 132/75   Pulse 85   Temp 98 F (36.7 C)   Resp (!) 27   Ht 1.829 m (6')   Wt 77.1 kg   SpO2 97%   BMI 23.05 kg/m   Physical Exam Vitals and nursing note reviewed.  Constitutional:      Appearance: He is well-developed and well-nourished.     Comments: Chronically ill-appearing but nontoxic  HENT:     Head: Normocephalic and atraumatic.     Mouth/Throat:     Mouth: Mucous membranes are moist.  Eyes:     Pupils: Pupils are equal, round, and reactive to light.  Cardiovascular:     Rate and Rhythm: Normal rate and regular rhythm.     Heart sounds: Normal heart sounds.  Pulmonary:     Effort: No respiratory distress.     Breath sounds: Examination of the right-lower field reveals decreased breath sounds. Examination of the left-lower field reveals decreased breath sounds. Decreased breath sounds present. No wheezing.     Comments: Diminished breath sounds bilateral bases, speaking in short sentences, appears winded, mild tachypnea Abdominal:     General: Bowel sounds are normal.     Palpations: Abdomen is soft.     Tenderness: There is no abdominal tenderness. There is no rebound.  Genitourinary:    Comments: Black stool noted on exam Musculoskeletal:        General: No edema.     Cervical back: Neck supple.     Comments: Trace bilateral lower extremity edema  Lymphadenopathy:     Cervical: No cervical adenopathy.  Skin:    General: Skin is warm and dry.  Neurological:     Mental Status: He is alert and oriented to person, place, and time.  Psychiatric:        Mood and Affect: Mood and affect and mood normal.     ED Results / Procedures / Treatments   Labs (all labs ordered are listed, but only abnormal results are displayed) Labs Reviewed  CBC WITH DIFFERENTIAL/PLATELET - Abnormal; Notable  for the following components:      Result Value   WBC 13.2 (*)    RBC 2.55 (*)    Hemoglobin 8.3 (*)    HCT 26.9 (*)    MCV 105.5 (*)    RDW 21.2 (*)    nRBC 3.2 (*)    Neutro Abs 9.8 (*)    Monocytes Absolute 2.0 (*)    Abs Immature Granulocytes 0.09 (*)    All other components within normal limits  BASIC METABOLIC PANEL - Abnormal; Notable for the following components:   Glucose, Bld 181 (*)    BUN 43 (*)    All other components within normal limits  BRAIN NATRIURETIC PEPTIDE - Abnormal; Notable for the following components:   B Natriuretic Peptide 983.0 (*)    All other components within normal limits  POC OCCULT BLOOD, ED - Abnormal; Notable for the following components:   Fecal Occult Bld POSITIVE (*)    All other components within normal limits  RESP PANEL BY RT-PCR (FLU A&B, COVID) ARPGX2  OCCULT BLOOD X 1 CARD TO LAB, STOOL    EKG EKG Interpretation  Date/Time:  Saturday September 12 2020 00:04:56 EST Ventricular Rate:  86 PR Interval:    QRS Duration: 152 QT Interval:  410 QTC Calculation: 491 R Axis:   -71 Text Interpretation: Sinus or ectopic atrial rhythm IVCD, consider atypical RBBB LVH with secondary repolarization abnormality  Baseline wander in lead(s) II III aVFPoor data quality, interpretation may be adversely affected No significant change since last tracing Confirmed by Thayer Jew [id number]) on 09/12/2020 12:42:31 AM   Radiology DG Chest Portable 1 View  Result Date: 09/12/2020 CLINICAL DATA:  Initial evaluation for acute shortness of breath. EXAM: PORTABLE CHEST 1 VIEW COMPARISON:  Prior radiograph from 08/20/2020. FINDINGS: Right-sided pacemaker/AICD in place. Cardiomegaly, stable. Mediastinal silhouette normal. Aortic atherosclerosis. Lungs hypoinflated. Small layering bilateral pleural effusions with associated bibasilar atelectasis. Diffuse prominence of the pulmonary vasculature and interstitial markings, favored to reflect mild pulmonary  interstitial congestion/edema. Atypical pneumonitis could also have this appearance. No pneumothorax. No acute osseous finding. IMPRESSION: 1. Cardiomegaly with diffuse prominence of the pulmonary vasculature and interstitial markings, favored to reflect mild pulmonary interstitial congestion/edema. Possible atypical pneumonitis could be considered in the correct clinical setting. 2. Superimposed small layering bilateral pleural effusions with associated bibasilar atelectasis. 3.  Aortic Atherosclerosis (ICD10-I70.0). Electronically Signed   By: Jeannine Boga M.D.   On: 09/12/2020 00:32    Procedures .Critical Care Performed by: Merryl Hacker, MD Authorized by: Merryl Hacker, MD   Critical care provider statement:    Critical care time (minutes):  45   Critical care was necessary to treat or prevent imminent or life-threatening deterioration of the following conditions:  Respiratory failure   Critical care was time spent personally by me on the following activities:  Discussions with consultants, evaluation of patient's response to treatment, examination of patient, ordering and performing treatments and interventions, ordering and review of laboratory studies, ordering and review of radiographic studies, pulse oximetry, re-evaluation of patient's condition, obtaining history from patient or surrogate and review of old charts   (including critical care time)  Medications Ordered in ED Medications  pantoprazole (PROTONIX) injection 40 mg (40 mg Intravenous Not Given 09/12/20 0119)  furosemide (LASIX) injection 40 mg (40 mg Intravenous Given 09/12/20 0050)  pantoprazole (PROTONIX) injection 40 mg (40 mg Intravenous Given 09/12/20 0113)    ED Course  I have reviewed the triage vital signs and the nursing notes.  Pertinent labs & imaging results that were available during my care of the patient were reviewed by me and considered in my medical decision making (see chart for  details).    MDM Rules/Calculators/A&P                          Patient presents with acute on chronic shortness of breath.  Recently started on prednisone and a Z-Pak by his pulmonologist.  He is chronically ill-appearing but nontoxic and vital signs are reassuring.  He is afebrile.  He is satting 99% on 3 L.  No overt respiratory distress.  He has very diminished breath sounds bilaterally but no wheezing.  I reviewed his prior echo which showed low normal EF of 50 to 55%.  He does not appear overtly volume overloaded but question multifactorial component with combination of chronic respiratory failure from COPD and possible CHF.  Chest x-ray does show pleural effusion bilaterally and some vascular prominence suggestive of mild pulmonary edema.  BNP is also elevated.  EKG is a paced rhythm without acute ischemic changes.  Patient is not having any active chest pain doubt ACS.  Of note, his hemoglobin is now 8.3.  Baseline 11-12.  He reports dark stools while in iron.  However, he is dark tarry stool on exam which is heme positive.  Question indolent GI bleed.  BUN is  elevated as well.  This could also be contributing to his shortness of breath.  Will start on Protonix.  Patient was also given 40 mg of IV Lasix.  Covid testing is pending.  Doubt infectious etiology at this time.  Will admit for optimization including diuresis and possible evaluation for GI bleed     Final Clinical Impression(s) / ED Diagnoses Final diagnoses:  SOB (shortness of breath)  Melena  Acute on chronic systolic heart failure (HCC)  Chronic respiratory failure with hypoxia (HCC)  Anemia, unspecified type    Rx / DC Orders ED Discharge Orders    None       Phoebe Marter, Barbette Hair, MD 09/12/20 [id number]    Merryl Hacker, MD 09/12/20 [phone]

## 2020-09-12 NOTE — Progress Notes (Signed)
ASSUMPTION OF CARE NOTE   09/12/2020 8:56 AM  Joshua [name] was seen and examined.  The H&P by the admitting provider, orders, imaging was reviewed.  Please see new orders.  Will continue to follow.  Pt awaiting room assignment.  He was seen in ED.  He is diuresing well.  He is on IV protonix.     Vitals:   09/12/20 0700 09/12/20 0708  BP: 120/63   Pulse: 63   Resp: 20   Temp:  98.1 F (36.7 C)  SpO2: 99%     Results for orders placed or performed during the hospital encounter of 09/12/20  Resp Panel by RT-PCR (Flu A&B, Covid) Nasopharyngeal Swab   Specimen: Nasopharyngeal Swab; Nasopharyngeal(NP) swabs in vial transport medium  Result Value Ref Range   SARS Coronavirus 2 by RT PCR NEGATIVE NEGATIVE   Influenza A by PCR NEGATIVE NEGATIVE   Influenza B by PCR NEGATIVE NEGATIVE  CBC with Differential  Result Value Ref Range   WBC 13.2 (H) 4.0 - 10.5 K/uL   RBC 2.55 (L) 4.22 - 5.81 MIL/uL   Hemoglobin 8.3 (L) 13.0 - 17.0 g/dL   HCT 26.9 (L) 39.0 - 52.0 %   MCV 105.5 (H) 80.0 - 100.0 fL   MCH 32.5 26.0 - 34.0 pg   MCHC 30.9 30.0 - 36.0 g/dL   RDW 21.2 (H) 11.5 - 15.5 %   Platelets 318 150 - 400 K/uL   nRBC 3.2 (H) 0.0 - 0.2 %   Neutrophils Relative % 74 %   Neutro Abs 9.8 (H) 1.7 - 7.7 K/uL   Lymphocytes Relative 8 %   Lymphs Abs 1.1 0.7 - 4.0 K/uL   Monocytes Relative 15 %   Monocytes Absolute 2.0 (H) 0.1 - 1.0 K/uL   Eosinophils Relative 1 %   Eosinophils Absolute 0.1 0.0 - 0.5 K/uL   Basophils Relative 1 %   Basophils Absolute 0.1 0.0 - 0.1 K/uL   Immature Granulocytes 1 %   Abs Immature Granulocytes 0.09 (H) 0.00 - 0.07 K/uL  Basic metabolic panel  Result Value Ref Range   Sodium 137 135 - 145 mmol/L   Potassium 4.2 3.5 - 5.1 mmol/L   Chloride 102 98 - 111 mmol/L   CO2 25 22 - 32 mmol/L   Glucose, Bld 181 (H) 70 - 99 mg/dL   BUN 43 (H) 8 - 23 mg/dL   Creatinine, Ser 0.98 0.61 - 1.24 mg/dL   Calcium 9.1 8.9 - 10.3 mg/dL   GFR, Estimated >60 >60 mL/min   Anion  gap 10 5 - 15  Brain natriuretic peptide  Result Value Ref Range   B Natriuretic Peptide 983.0 (H) 0.0 - 100.0 pg/mL  Magnesium  Result Value Ref Range   Magnesium 2.4 1.7 - 2.4 mg/dL  Hemoglobin  Result Value Ref Range   Hemoglobin 7.1 (L) 13.0 - 17.0 g/dL  Protime-INR  Result Value Ref Range   Prothrombin Time 15.4 (H) 11.4 - 15.2 seconds   INR 1.3 (H) 0.8 - 1.2  APTT  Result Value Ref Range   aPTT 34 24 - 36 seconds  Hepatic function panel  Result Value Ref Range   Total Protein 6.0 (L) 6.5 - 8.1 g/dL   Albumin 3.4 (L) 3.5 - 5.0 g/dL   AST 66 (H) 15 - 41 U/L   ALT 72 (H) 0 - 44 U/L   Alkaline Phosphatase 130 (H) 38 - 126 U/L   Total Bilirubin 0.7 0.3 - 1.2 mg/dL  Bilirubin, Direct 0.2 0.0 - 0.2 mg/dL   Indirect Bilirubin 0.5 0.3 - 0.9 mg/dL  Vitamin B12  Result Value Ref Range   Vitamin B-12 520 180 - 914 pg/mL  Folate  Result Value Ref Range   Folate 7.4 >5.9 ng/mL  Iron and TIBC  Result Value Ref Range   Iron 39 (L) 45 - 182 ug/dL   TIBC 277 250 - 450 ug/dL   Saturation Ratios 14 (L) 17.9 - 39.5 %   UIBC 238 ug/dL  Ferritin  Result Value Ref Range   Ferritin 67 24 - 336 ng/mL  Reticulocytes  Result Value Ref Range   Retic Ct Pct 6.0 (H) 0.4 - 3.1 %   RBC. 2.23 (L) 4.22 - 5.81 MIL/uL   Retic Count, Absolute 133.8 19.0 - 186.0 K/uL   Immature Retic Fract 33.2 (H) 2.3 - 15.9 %  Phenobarbital level  Result Value Ref Range   Phenobarbital 30.1 (H) 15.0 - 30.0 ug/mL  Phenytoin level, total  Result Value Ref Range   Phenytoin Lvl 17.8 10.0 - 20.0 ug/mL  POC occult blood, ED  Result Value Ref Range   Fecal Occult Bld POSITIVE (A) Lovenia Kim, MD [facility]   09/12/2020 12:00 AM How to contact the [facility] Attending or Consulting provider [place] or covering provider during after hours 7P -7A, for this patient?  1. Check the care team in [facility] and look for a) attending/consulting [facility] provider listed and b) the [facility] team listed 2. Log into  [email or web address] and use [place]'s universal password to access. If you do not have the password, please contact the hospital operator. 3. Locate the [facility] provider you are looking for under [facility] and page to a number that you can be directly reached. 4. If you still have difficulty reaching the provider, please page the [facility] (Director on Call) for the Hospitalists listed on amion for assistance.

## 2020-09-12 NOTE — ED Triage Notes (Signed)
[facility] - pt from home with c/o SOB. Pt wears 2LPM via [place] currently 4LPM with EMS. O2 was 90% on EMS arrival.

## 2020-09-12 NOTE — H&P (Signed)
History and Physical    Joshuadavid Arritt MPN:[id number] DOB: [birth year] DOA: 09/12/2020  PCP: Bennie Pierini, FNP   Patient coming from: Home.   I have personally briefly reviewed patient's old medical records in [facility]  Chief Complaint: Shortness of breath.  HPI: Joshua [name] is a 86 y.o. male with medical history significant of intracerebral AV malformation with a history of intracerebral bleed and seizure treated with craniotomy in 1980, osteoarthritis, lower extremity edema, bladder cancer, CAD, COPD, Mobitz 1 arrhythmia, pacemaker placement GERD, hyperlipidemia, hypertension, macular degeneration, vitamin D deficiency who is coming to the emergency department due to progressively worse dyspnea for the past 2 days and an episode of dark tarry looking stools yesterday.  He takes a daily iron supplement, but states that usually the stools do not look as dark.  He denies abdominal pain, nausea, emesis, diarrhea, constipation or BRBPR.  He mentions that he has been taking Aleve nearly daily for the past year.  No fever, chills, but positive generalized weakness.  No wheezing or hemoptysis.  No chest pain, palpitations, diaphoresis, dysuria, frequency or hematuria.  No polyuria, polydipsia, polyphagia or blurred vision.  ED Course: Initial vital signs were temperature 98 F, pulse 87, respirations 23, BP 150/82 mmHg O2 sat 96% on room air.  The patient received 40 mg of furosemide IVP and 40 mg of pantoprazole IVPB.  Labwork: CBC showed a white count of 13.2, hemoglobin 8.3 g/dL and platelets 008.  PT was 15.4, INR 1.3 and PTT 34.  Fecal occult blood was positive.  SARS 2 and influenza PCR was negative.  BNP was 983.0 pg/mL.  BMP showed a glucose of 181 and BUN of 43 mg/dL.  Creatinine and electrolytes are within normal limits.  Imaging: Single view chest radiograph shows cardiomegaly with diffuse prominence of the pulmonary vasculature and interstitial markings, favored to reflect mild  pulmonary interstitial congestion/edema.  Possible atypical pneumonitis could be considered.  Superimposed small layering bilateral pleural effusions with associated bibasilar atelectasis.  There is aortic atherosclerosis.  Please see images and full radiology report for further detail.  Review of Systems: As per HPI otherwise all other systems reviewed and are negative.  Past Medical History:  Diagnosis Date  . 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 Arthritis   . Bilateral lower extremity edema   . Bladder tumor   . CAD (coronary artery disease) cardiologist--  dr Verdis Prime   a. 04/27/2016 PCI with DES to RCA with 50% ostial to 60% segmental mid to distal left main, and 90% thrombus filled ostial to proximal circumflex, with distal right coronary filled by collaterals from left-to-right  . COPD with emphysema (HCC)   . Dyspnea on exertion   . ED (erectile dysfunction)   . First degree AV block   . GERD (gastroesophageal reflux disease)   . History of adenomatous polyp of colon    09-22-2008  . History of bladder cancer    s/p  resection bladder tumor 04-19-2016  non-invasive low-grade urothelial carcinoma  . History of prostate cancer urologist-  dr Annabell Howells-- last PSA 0.02 (summer 2017)   dx 02/ 2013-- Stage T2a, Gleason 7, PSA 1.58--  s/p  radioactive prostate seed implants 01-19-2012  . History of ST elevation myocardial infarction (STEMI)    04-27-2016  . Hyperlipidemia   . Hypertension   . Macular degeneration   . Presence of permanent cardiac pacemaker   . S/P drug eluting coronary stent  placement    04-27-2016   . Second degree Mobitz I AV block    occasional w/ first degree heart block  per cardiologist note by dr Daneen Schick  . Seizures ([place]) per pt son -- no seizure's since 1980   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  arteriorvenous cerebral malformation s/p  craniotomy 1980  . Vitamin D deficiency     Past Surgical History:  Procedure Laterality Date  . APPENDECTOMY  2011  . BLEPHAROPLASTY Bilateral revision 02-17-2016  . CARDIAC CATHETERIZATION N/A 04/27/2016   Procedure: Left Heart Cath and Coronary Angiography;  Surgeon: Belva Crome, MD;  Location: [place] [facility];  Service: Cardiovascular;  Laterality: N/A;  acute inferior wall STEMI ;  ostCx to midCx 90%,  ost1st Mrg to 1st Mrg 50%,  mid to distal RCA 100%,  pRCA 80%,  LVEF 45-50% w/ inferobasal akinesis  . CARDIAC CATHETERIZATION N/A 04/27/2016   Procedure: Temporary Pacemaker;  Surgeon: Belva Crome, MD;  Location: [place] [facility];  Service: Cardiovascular;  Laterality: N/A;  mobitz 2  second degree HB w/ heart rate 29bpm  . CARDIAC CATHETERIZATION N/A 04/27/2016   Procedure: Coronary Stent Intervention;  Surgeon: Belva Crome, MD;  Location: [place] [facility];  Service: Cardiovascular;  Laterality: N/A; DES x1 to  Proximal RCA;  DES x1 to Mid RCA  . CATARACT EXTRACTION W/ INTRAOCULAR LENS  IMPLANT, BILATERAL  2016  approx.  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 CYSTOSCOPY  01/19/2012   Procedure: CYSTOSCOPY;  Surgeon: Malka So, MD;  Location: [facility];  Service: Urology;;  no seeds found in bladder  . CYSTOSCOPY W/ RETROGRADES Bilateral 04/19/2016   Procedure: CYSTOSCOPY WITH BILATERAL RETROGRADE PYELOGRAM TRANSURETHRAL RESECTION OF BLADDER TUMOR ;  Surgeon: [name], MD;  Location: [facility];  Service: Urology;  Laterality: Bilateral;  . CYSTOSCOPY WITH BIOPSY N/A 11/10/2016   Procedure: CYSTOSCOPY WITH BIOPSY AND FULGURATION;  Surgeon: [name], MD;  Location: [facility];  Service: Urology;  Laterality: N/A;  . INSERT / REPLACE / REMOVE PACEMAKER  06/15/2017  . LEAD EXTRACTION N/A 01/22/2018   Procedure: LEAD EXTRACTION;  Surgeon: Evans Lance, MD;  Location: [place] [facility];  Service: Cardiovascular;  Laterality: N/A;  . PACEMAKER  IMPLANT N/A 06/15/2017   Procedure: Pacemaker Implant;  Surgeon: [name], MD;  Location: [place] [facility];  Service: Cardiovascular;  Laterality: N/A;  . PACEMAKER IMPLANT N/A 01/25/2018   Procedure: PACEMAKER IMPLANT;  Surgeon: Evans Lance, MD;  Location: [place] [facility];  Service: Cardiovascular;  Laterality: N/A;  . PPM GENERATOR REMOVAL N/A 01/22/2018   Procedure: PPM GENERATOR REMOVAL;  Surgeon: Evans Lance, MD;  Location: [place] [facility];  Service: Cardiovascular;  Laterality: N/A;  . RADIOACTIVE SEED IMPLANT  01/19/2012   Procedure: RADIOACTIVE SEED IMPLANT;  Surgeon: Malka So, MD;  Location: [facility];  Service: Urology;  Laterality: N/A;  68 seeds implanted  . TRANSTHORACIC ECHOCARDIOGRAM  06/02/2016   ef 60-65%/  trivial MR/  mild TR   Social History  reports that he quit smoking about 47 years ago. He has a 30.00 pack-year smoking history. He has never used smokeless tobacco. He reports current alcohol use. He reports that he does not use drugs.  Allergies  Allergen Reactions  . Penicillins Rash    Has patient had a PCN reaction causing  immediate rash, facial/tongue/throat swelling, SOB or lightheadedness with hypotension: ##Yes## Has patient had a PCN reaction causing severe rash involving mucus membranes or skin necrosis: No Has patient had a PCN reaction that required hospitalization No Has patient had a PCN reaction occurring within the last 10 years: No If all of the above answers are "NO", then may proceed with Cephalosporin use.   . Prednisone     "shaking and hands twitching"    Family History  Problem Relation Age of Onset  . Emphysema Mother        age 40 deceased  . Stroke Mother   . Emphysema Father        deceased age 98   Prior to Admission medications   Medication Sig Start Date End Date Taking? Authorizing Provider  albuterol (VENTOLIN HFA) 108 (90 Base) MCG/ACT inhaler Inhale 1-2 puffs into the lungs every 6 (six)  hours as needed for wheezing or shortness of breath. 08/20/20   Martyn Ehrich, NP  aspirin EC 81 MG tablet Take 81 mg by mouth daily. Swallow whole.    [provider]  atorvastatin (LIPITOR) 20 MG tablet Take 1 tablet (20 mg total) by mouth daily. 07/01/20   Hassell Done, Mary-Margaret, FNP  azithromycin (ZITHROMAX) 250 MG tablet Take 1 tablet (250 mg total) by mouth as directed. 09/09/20   Mannam, Hart Robinsons, MD  calcium carbonate (OSCAL) 1500 (600 Ca) MG TABS tablet Take 600 mg of elemental calcium by mouth daily with breakfast.    [provider]  Cholecalciferol (VITAMIN D3) 50 MCG (2000 UT) capsule Take 2,000 Units by mouth daily.     [provider]  citalopram (CELEXA) 20 MG tablet TAKE 1 TABLET DAILY 08/31/20   Hassell Done, Mary-Margaret, FNP  clopidogrel (PLAVIX) 75 MG tablet Take 1 tablet (75 mg total) by mouth daily. 08/28/20   Hassell Done, Mary-Margaret, FNP  famotidine (PEPCID) 20 MG tablet Take 20 mg by mouth daily.     [provider]  ferrous sulfate 325 (65 FE) MG tablet Take 1 tablet (325 mg total) by mouth daily with breakfast. 10/31/18   Gatha Mayer, MD  furosemide (LASIX) 40 MG tablet Take 0.5 tablets (20 mg total) by mouth daily. 06/26/20 07/26/20  Raiford Noble Latif, DO  levofloxacin (LEVAQUIN) 500 MG tablet Take 1 tablet (500 mg total) by mouth daily. 08/20/20   Martyn Ehrich, NP  MAGNESIUM PO Take 1 tablet by mouth at bedtime.    [provider]  Multiple Vitamins-Minerals (PRESERVISION AREDS 2) CAPS Take 1 capsule by mouth 2 (two) times daily.     [provider]  PHENobarbital (LUMINAL) 64.8 MG tablet Take 1 tablet (64.8 mg total) by mouth 2 (two) times daily. 07/01/20   Hassell Done, Mary-Margaret, FNP  phenytoin (DILANTIN) 100 MG ER capsule Take 1 capsule (100 mg total) by mouth in the morning, at noon, in the evening, and at bedtime. 09/09/20   Hassell Done, Mary-Margaret, FNP  polyvinyl alcohol (LIQUIFILM TEARS) 1.4 % ophthalmic  solution Place 2 drops into both eyes daily as needed for dry eyes.     [provider]  predniSONE (DELTASONE) 10 MG tablet Take 4 tabs po daily x 3 days; then 3 tabs daily x3 days; then 2 tabs daily x3 days; then 1 tab daily x 3 days; then stop 08/20/20   Martyn Ehrich, NP  predniSONE (DELTASONE) 20 MG tablet Take 1 tablet (20 mg total) by mouth daily with breakfast. 09/09/20   Martyn Ehrich, NP  spironolactone (ALDACTONE) 25 MG tablet Take 1 tablet (25 mg total) by mouth daily. 09/01/20   Chevis Pretty, FNP   Physical Exam: Vitals:   09/12/20 0030 09/12/20 0100 09/12/20 0130 09/12/20 0200  BP: (!) 141/79 132/75 136/76 133/74  Pulse: 84 85 66 67  Resp: (!) 24 (!) 27 (!) 23 19  Temp:      SpO2: 99% 97% 100% 99%  Weight:      Height:       Constitutional: NAD, calm, comfortable Eyes: PERRL, lids and conjunctivae are pale. ENMT: Mucous membranes are moist. Posterior pharynx clear of any exudate or lesions.  Neck: normal, supple, no masses, no thyromegaly Respiratory: No wheezing, bibasilar crackles. Normal respiratory effort. No accessory muscle use.  Cardiovascular: Regular rate and rhythm, no murmurs / rubs / gallops.  1+ bilateral lower extremity pitting edema. 2+ pedal pulses. No carotid bruits.  Abdomen: No distention.  Bowel sounds positive.  Soft, no tenderness, no masses palpated. No hepatosplenomegaly. Musculoskeletal: no clubbing / cyanosis. Good [name], no contractures. Normal muscle tone.  Skin: Some areas of ecchymosis on extremities. Neurologic: CN 2-12 grossly intact. Sensation intact, DTR normal. Strength 5/5 in all 4.  Psychiatric: Normal judgment and insight. Alert and oriented x 3. Normal mood.   Labs on Admission: I have personally reviewed following labs and imaging studies  CBC: Recent Labs  Lab 09/12/20 0015  WBC 13.2*  NEUTROABS 9.8*  HGB 8.3*  HCT 26.9*  MCV 105.5*  PLT 0000000    Basic Metabolic Panel: Recent Labs  Lab  09/12/20 0015  NA 137  K 4.2  CL 102  CO2 25  GLUCOSE 181*  BUN 43*  CREATININE 0.98  CALCIUM 9.1  MG 2.4    GFR: Estimated Creatinine Clearance: 59 mL/min (by C-G formula based on SCr of 0.98 mg/dL).  Liver Function Tests: No results for input(s): AST, ALT, ALKPHOS, BILITOT, PROT, ALBUMIN in the last 168 hours.  Radiological Exams on Admission: DG Chest Portable 1 View  Result Date: 09/12/2020 CLINICAL DATA:  Initial evaluation for acute shortness of breath. EXAM: PORTABLE CHEST 1 VIEW COMPARISON:  Prior radiograph from 08/20/2020. FINDINGS: Right-sided pacemaker/AICD in place. Cardiomegaly, stable. Mediastinal silhouette normal. Aortic atherosclerosis. Lungs hypoinflated. Small layering bilateral pleural effusions with associated bibasilar atelectasis. Diffuse prominence of the pulmonary vasculature and interstitial markings, favored to reflect mild pulmonary interstitial congestion/edema. Atypical pneumonitis could also have this appearance. No pneumothorax. No acute osseous finding. IMPRESSION: 1. Cardiomegaly with diffuse prominence of the pulmonary vasculature and interstitial markings, favored to reflect mild pulmonary interstitial congestion/edema. Possible atypical pneumonitis could be considered in the correct clinical setting. 2. Superimposed small layering bilateral pleural effusions with associated bibasilar atelectasis. 3.  Aortic Atherosclerosis (ICD10-I70.0). Electronically Signed   By: Jeannine Boga M.D.   On: 09/12/2020 00:32   Echocardiogram 06/23/2020.  IMPRESSIONS: 1. Left ventricular ejection fraction, by estimation, is 50 to 55%. The  left ventricle has low normal function. The left ventricle has no regional  wall motion abnormalities. There is mild concentric left ventricular  hypertrophy. Left ventricular  diastolic parameters are consistent with Grade I diastolic dysfunction  (impaired relaxation).  2. Right ventricular systolic function is mildly  reduced. The right  ventricular size is normal. There is moderately elevated pulmonary artery  systolic pressure.  3. Right atrial size was mildly dilated.  4. The mitral valve is normal in structure. Mild mitral valve  regurgitation.  5. The aortic valve is tricuspid. There is mild calcification of the  aortic valve. There is mild thickening of the aortic valve. Aortic valve  regurgitation is not visualized.  6. Aortic dilatation noted. There is mild dilatation of the aortic root,  measuring 37 mm.  7. The inferior vena cava is dilated in size with >50% respiratory  variability, suggesting right atrial pressure of 8 mmHg.   EKG: Independently reviewed.  Vent. rate 86 BPM PR interval * ms QRS duration 152 ms QT/QTc 410/491 ms P-R-T axes 250 -71 121 Sinus or ectopic atrial rhythm IVCD, consider atypical RBBB LVH with secondary repolarization abnormality Baseline wander in lead(s) II III aVF  Assessment/Plan Principal Problem:   Acute on chronic diastolic congestive heart failure (HCC) Observation/stepdown. Continue supplemental oxygen. Fluid restriction. Furosemide 20 mg IVP daily. (Received 40 mg IVP earlier) Monitor daily weights, intake and output. Continue spironolactone 25 mg p.o. daily.  Active Problems:   Heme positive stool He takes an iron supplement, but: Has a decreased hemoglobin level Elevated BUN level with normal creatinine..    Macrocytic/acute blood loss anemia Check anemia panel. Monitor H&H. Transfuse as needed.    GERD On Protonix.    Hypercholesterolemia Resume atorvastatin once med reconciliation performed.    Essential hypertension On IV furosemide. Monitor BP, renal function electrolytes.    Seizures (HCC) Check phenytoin level. Check phenobarbital level. Resume meds later today pending med reconciliation.    CAD (coronary artery disease) Hold antiplatelet therapy pending GI evaluation.     DVT prophylaxis: SCDs. Code  Status:   Full code. Family Communication: Disposition Plan:   Patient is from:  Home.  Anticipated DC to:  Home.  Anticipated DC date:  07/14/2020.  Anticipated DC barriers: Clinical status.  Consults called:  Routine gastroenterology consult. Admission status:  Observation/stepdown.   Severity of Illness: High due to exacerbation of acute diastolic CHF with worsening macrocytic anemia likely due to GI loss.  Reubin Milan MD [facility]  How to contact the [facility] Attending or Consulting provider [place] or covering provider during after hours [place], for this patient?   1. Check the care team in [facility] and look for a) attending/consulting [facility] provider listed and b) the [facility] team listed 2. Log into [email or web address] and use [place]'s universal password to access. If you do not have the password, please contact the hospital operator. 3. Locate the [facility] provider you are looking for under [facility] and page to a number that you can be directly reached. 4. If you still have difficulty reaching the provider, please page the [facility] (Director on Call) for the Hospitalists listed on amion for assistance.  09/12/2020, 4:09 AM   This document was prepared using Dragon voice recognition software and may contain some unintended transcription errors.

## 2020-09-12 NOTE — Progress Notes (Signed)
Bipap is a PRN order, Bipap not needed at this time, no distress noted, will continue to monitor.

## 2020-09-13 ENCOUNTER — Observation Stay (HOSPITAL_COMMUNITY): Payer: Medicare Other

## 2020-09-13 DIAGNOSIS — J690 Pneumonitis due to inhalation of food and vomit: Secondary | ICD-10-CM | POA: Diagnosis not present

## 2020-09-13 DIAGNOSIS — K2950 Unspecified chronic gastritis without bleeding: Secondary | ICD-10-CM | POA: Diagnosis not present

## 2020-09-13 DIAGNOSIS — J941 Fibrothorax: Secondary | ICD-10-CM | POA: Diagnosis not present

## 2020-09-13 DIAGNOSIS — R001 Bradycardia, unspecified: Secondary | ICD-10-CM | POA: Diagnosis not present

## 2020-09-13 DIAGNOSIS — I509 Heart failure, unspecified: Secondary | ICD-10-CM | POA: Diagnosis not present

## 2020-09-13 DIAGNOSIS — R0602 Shortness of breath: Secondary | ICD-10-CM | POA: Diagnosis not present

## 2020-09-13 DIAGNOSIS — J811 Chronic pulmonary edema: Secondary | ICD-10-CM | POA: Diagnosis not present

## 2020-09-13 DIAGNOSIS — I517 Cardiomegaly: Secondary | ICD-10-CM | POA: Diagnosis not present

## 2020-09-13 DIAGNOSIS — D649 Anemia, unspecified: Secondary | ICD-10-CM | POA: Diagnosis not present

## 2020-09-13 DIAGNOSIS — I252 Old myocardial infarction: Secondary | ICD-10-CM | POA: Diagnosis not present

## 2020-09-13 DIAGNOSIS — K8020 Calculus of gallbladder without cholecystitis without obstruction: Secondary | ICD-10-CM | POA: Diagnosis not present

## 2020-09-13 DIAGNOSIS — I441 Atrioventricular block, second degree: Secondary | ICD-10-CM | POA: Diagnosis present

## 2020-09-13 DIAGNOSIS — I2510 Atherosclerotic heart disease of native coronary artery without angina pectoris: Secondary | ICD-10-CM | POA: Diagnosis present

## 2020-09-13 DIAGNOSIS — I5023 Acute on chronic systolic (congestive) heart failure: Secondary | ICD-10-CM | POA: Diagnosis not present

## 2020-09-13 DIAGNOSIS — Z955 Presence of coronary angioplasty implant and graft: Secondary | ICD-10-CM | POA: Diagnosis not present

## 2020-09-13 DIAGNOSIS — J84112 Idiopathic pulmonary fibrosis: Secondary | ICD-10-CM | POA: Diagnosis present

## 2020-09-13 DIAGNOSIS — Z8546 Personal history of malignant neoplasm of prostate: Secondary | ICD-10-CM | POA: Diagnosis not present

## 2020-09-13 DIAGNOSIS — Z8551 Personal history of malignant neoplasm of bladder: Secondary | ICD-10-CM | POA: Diagnosis not present

## 2020-09-13 DIAGNOSIS — Z20822 Contact with and (suspected) exposure to covid-19: Secondary | ICD-10-CM | POA: Diagnosis not present

## 2020-09-13 DIAGNOSIS — Z950 Presence of cardiac pacemaker: Secondary | ICD-10-CM | POA: Diagnosis not present

## 2020-09-13 DIAGNOSIS — R195 Other fecal abnormalities: Secondary | ICD-10-CM | POA: Diagnosis not present

## 2020-09-13 DIAGNOSIS — Z7982 Long term (current) use of aspirin: Secondary | ICD-10-CM | POA: Diagnosis not present

## 2020-09-13 DIAGNOSIS — E7800 Pure hypercholesterolemia, unspecified: Secondary | ICD-10-CM

## 2020-09-13 DIAGNOSIS — G40909 Epilepsy, unspecified, not intractable, without status epilepticus: Secondary | ICD-10-CM | POA: Diagnosis present

## 2020-09-13 DIAGNOSIS — Z9181 History of falling: Secondary | ICD-10-CM | POA: Diagnosis not present

## 2020-09-13 DIAGNOSIS — E785 Hyperlipidemia, unspecified: Secondary | ICD-10-CM | POA: Diagnosis not present

## 2020-09-13 DIAGNOSIS — R569 Unspecified convulsions: Secondary | ICD-10-CM | POA: Diagnosis not present

## 2020-09-13 DIAGNOSIS — Z880 Allergy status to penicillin: Secondary | ICD-10-CM | POA: Diagnosis not present

## 2020-09-13 DIAGNOSIS — D509 Iron deficiency anemia, unspecified: Secondary | ICD-10-CM | POA: Diagnosis present

## 2020-09-13 DIAGNOSIS — C61 Malignant neoplasm of prostate: Secondary | ICD-10-CM | POA: Diagnosis not present

## 2020-09-13 DIAGNOSIS — Z8679 Personal history of other diseases of the circulatory system: Secondary | ICD-10-CM | POA: Diagnosis not present

## 2020-09-13 DIAGNOSIS — E559 Vitamin D deficiency, unspecified: Secondary | ICD-10-CM | POA: Diagnosis not present

## 2020-09-13 DIAGNOSIS — I5043 Acute on chronic combined systolic (congestive) and diastolic (congestive) heart failure: Secondary | ICD-10-CM | POA: Diagnosis not present

## 2020-09-13 DIAGNOSIS — K219 Gastro-esophageal reflux disease without esophagitis: Secondary | ICD-10-CM | POA: Diagnosis not present

## 2020-09-13 DIAGNOSIS — J449 Chronic obstructive pulmonary disease, unspecified: Secondary | ICD-10-CM | POA: Diagnosis not present

## 2020-09-13 DIAGNOSIS — J9621 Acute and chronic respiratory failure with hypoxia: Secondary | ICD-10-CM | POA: Diagnosis not present

## 2020-09-13 DIAGNOSIS — K921 Melena: Secondary | ICD-10-CM | POA: Diagnosis not present

## 2020-09-13 DIAGNOSIS — M6281 Muscle weakness (generalized): Secondary | ICD-10-CM | POA: Diagnosis not present

## 2020-09-13 DIAGNOSIS — Z7189 Other specified counseling: Secondary | ICD-10-CM | POA: Diagnosis not present

## 2020-09-13 DIAGNOSIS — R532 Functional quadriplegia: Secondary | ICD-10-CM | POA: Diagnosis not present

## 2020-09-13 DIAGNOSIS — I5033 Acute on chronic diastolic (congestive) heart failure: Secondary | ICD-10-CM | POA: Diagnosis not present

## 2020-09-13 DIAGNOSIS — D539 Nutritional anemia, unspecified: Secondary | ICD-10-CM | POA: Diagnosis not present

## 2020-09-13 DIAGNOSIS — Z9981 Dependence on supplemental oxygen: Secondary | ICD-10-CM | POA: Diagnosis not present

## 2020-09-13 DIAGNOSIS — J9611 Chronic respiratory failure with hypoxia: Secondary | ICD-10-CM | POA: Diagnosis not present

## 2020-09-13 DIAGNOSIS — I10 Essential (primary) hypertension: Secondary | ICD-10-CM | POA: Diagnosis not present

## 2020-09-13 DIAGNOSIS — Z515 Encounter for palliative care: Secondary | ICD-10-CM | POA: Diagnosis not present

## 2020-09-13 DIAGNOSIS — I110 Hypertensive heart disease with heart failure: Secondary | ICD-10-CM | POA: Diagnosis not present

## 2020-09-13 DIAGNOSIS — J439 Emphysema, unspecified: Secondary | ICD-10-CM | POA: Diagnosis present

## 2020-09-13 DIAGNOSIS — K922 Gastrointestinal hemorrhage, unspecified: Secondary | ICD-10-CM | POA: Diagnosis not present

## 2020-09-13 DIAGNOSIS — K31819 Angiodysplasia of stomach and duodenum without bleeding: Secondary | ICD-10-CM | POA: Diagnosis not present

## 2020-09-13 DIAGNOSIS — Z86010 Personal history of colonic polyps: Secondary | ICD-10-CM | POA: Diagnosis not present

## 2020-09-13 DIAGNOSIS — F329 Major depressive disorder, single episode, unspecified: Secondary | ICD-10-CM | POA: Diagnosis not present

## 2020-09-13 LAB — COMPREHENSIVE METABOLIC PANEL
ALT: 109 U/L — ABNORMAL HIGH (ref 0–44)
AST: 102 U/L — ABNORMAL HIGH (ref 15–41)
Albumin: 3.2 g/dL — ABNORMAL LOW (ref 3.5–5.0)
Alkaline Phosphatase: 134 U/L — ABNORMAL HIGH (ref 38–126)
Anion gap: 11 (ref 5–15)
BUN: 42 mg/dL — ABNORMAL HIGH (ref 8–23)
CO2: 26 mmol/L (ref 22–32)
Calcium: 8.6 mg/dL — ABNORMAL LOW (ref 8.9–10.3)
Chloride: 103 mmol/L (ref 98–111)
Creatinine, Ser: 0.88 mg/dL (ref 0.61–1.24)
GFR, Estimated: >60 mL/min (ref >60)
Glucose, Bld: 104 mg/dL — ABNORMAL HIGH (ref 70–99)
Potassium: 3.6 mmol/L (ref 3.5–5.1)
Sodium: 140 mmol/L (ref 135–145)
Total Bilirubin: 0.8 mg/dL (ref 0.3–1.2)
Total Protein: 5.7 g/dL — ABNORMAL LOW (ref 6.5–8.1)

## 2020-09-13 LAB — CBC
HCT: 23.8 % — ABNORMAL LOW (ref 39.0–52.0)
Hemoglobin: 7.3 g/dL — ABNORMAL LOW (ref 13.0–17.0)
MCH: 32 pg (ref 26.0–34.0)
MCHC: 30.7 g/dL (ref 30.0–36.0)
MCV: 104.4 fL — ABNORMAL HIGH (ref 80.0–100.0)
Platelets: 263 10*3/uL (ref 150–400)
RBC: 2.28 MIL/uL — ABNORMAL LOW (ref 4.22–5.81)
RDW: 20.7 % — ABNORMAL HIGH (ref 11.5–15.5)
WBC: 9.6 10*3/uL (ref 4.0–10.5)
nRBC: 1.2 % — ABNORMAL HIGH (ref 0.0–0.2)

## 2020-09-13 LAB — PROTIME-INR
INR: 1.3 — ABNORMAL HIGH (ref 0.8–1.2)
Prothrombin Time: 15.6 seconds — ABNORMAL HIGH (ref 11.4–15.2)

## 2020-09-13 LAB — BRAIN NATRIURETIC PEPTIDE: B Natriuretic Peptide: 866 pg/mL — ABNORMAL HIGH (ref 0.0–100.0)

## 2020-09-13 LAB — MAGNESIUM: Magnesium: 2.2 mg/dL (ref 1.7–2.4)

## 2020-09-13 LAB — MRSA PCR SCREENING: MRSA by PCR: NEGATIVE

## 2020-09-13 MED ORDER — PROSIGHT PO TABS
1.0000 | ORAL_TABLET | Freq: Two times a day (BID) | ORAL | Status: DC
  Administered 2020-09-14 – 2020-09-18 (×10): 1 via ORAL
  Filled 2020-09-13 (×10): qty 1

## 2020-09-13 NOTE — Progress Notes (Signed)
I reviewed patient's records. As I was heading to see him Dr. Irwin Brakeman informed me that he is being transferred to [facility] as all of his physicians are based at that facility I noticed that he had EGD and colonoscopy by Dr. Carlean Purl in February last year. Therefore will hold off on formal consultation

## 2020-09-13 NOTE — Evaluation (Signed)
Physical Therapy Evaluation Patient Details Name: Joshua [name] MRN: [id number] DOB: 1932/03/27 Today's Date: 09/13/2020   History of Present Illness  Joshua [name] is a 86 y.o. male with medical history significant of intracerebral AV malformation with a history of intracerebral bleed and seizure treated with craniotomy in 1980, osteoarthritis, lower extremity edema, bladder cancer, CAD, COPD, Mobitz 1 arrhythmia, pacemaker placement GERD, hyperlipidemia, hypertension, macular degeneration, vitamin D deficiency who is coming to the emergency department due to progressively worse dyspnea for the past 2 days and an episode of dark tarry looking stools yesterday.  He takes a daily iron supplement, but states that usually the stools do not look as dark.  He denies abdominal pain, nausea, emesis, diarrhea, constipation or BRBPR.  He mentions that he has been taking Aleve nearly daily for the past year.  No fever, chills, but positive generalized weakness.  No wheezing or hemoptysis.  No chest pain, palpitations, diaphoresis, dysuria, frequency or hematuria.  No polyuria, polydipsia, polyphagia or blurred vision.    Clinical Impression  Patient demonstrates slow labored movement for sitting up at bedside requiring frequent verbal/tactile cueing for proper hand placement to scoot self forward with fair carryover, had difficulty holding head up due to weakness and limited to a few unsteady labored side steps before having to sit due to fall risk and near loss of balance.  Patient tolerated sitting up in chair after therapy with his daughter present in room - RN aware.  Patient will benefit from continued physical therapy in hospital and recommended venue below to increase strength, balance, endurance for safe ADLs and gait.      Follow Up Recommendations SNF    Equipment Recommendations  None recommended by PT    Recommendations for Other Services       Precautions / Restrictions  Precautions Precautions: Fall Restrictions Weight Bearing Restrictions: No      Mobility  Bed Mobility Overal bed mobility: Needs Assistance Bed Mobility: Supine to Sit     Supine to sit: Mod assist;Max assist;HOB elevated     General bed mobility comments: slow labored movement    Transfers Overall transfer level: Needs assistance   Transfers: Sit to/from Stand;Stand Pivot Transfers Sit to Stand: Mod assist;Max assist Stand pivot transfers: Mod assist;Max assist       General transfer comment: increased time, labored movement, flexed trunk due to weakness  Ambulation/Gait Ambulation/Gait assistance: Max assist Gait Distance (Feet): 3 Feet Assistive device: Rolling walker (2 wheeled) Gait Pattern/deviations: Decreased step length - right;Decreased step length - left;Decreased stride length Gait velocity: decreased   General Gait Details: limited to 2-3 unsteady labored steps with near loss of balance due to weakness  Stairs            Wheelchair Mobility    Modified Rankin (Stroke Patients Only)       Balance Overall balance assessment: Needs assistance Sitting-balance support: Feet supported;No upper extremity supported Sitting balance-Leahy Scale: Fair Sitting balance - Comments: seated at EOB, had difficulty holding head up due to weakness   Standing balance support: During functional activity;Bilateral upper extremity supported Standing balance-Leahy Scale: Poor Standing balance comment: using RW                             Pertinent Vitals/Pain Pain Assessment: No/denies pain    Home Living Family/patient expects to be discharged to:: Private residence Living Arrangements: Alone Available Help at Discharge: Family;Personal care attendant;Available 24 hours/day Type  of Home: House Home Access: Stairs to enter Entrance Stairs-Rails: None Entrance Stairs-Number of Steps: 2 Home Layout: Two level;Bed/bath upstairs;Able to live on  main level with bedroom/bathroom Home Equipment: Grab bars - tub/shower;Walker - 2 wheels;Other (comment);Hand held shower head;Wheelchair - manual      Prior Function Level of Independence: Needs assistance   Gait / Transfers Assistance Needed: household ambulator using RW with home aides assisting  ADL's / Homemaking Assistance Needed: home aides/family 24/7 x 7 days/week        Hand Dominance   Dominant Hand: Right    Extremity/Trunk Assessment   Upper Extremity Assessment Upper Extremity Assessment: Generalized weakness    Lower Extremity Assessment Lower Extremity Assessment: Generalized weakness    Cervical / Trunk Assessment Cervical / Trunk Assessment: Kyphotic  Communication   Communication: No difficulties  Cognition Arousal/Alertness: Awake/alert Behavior During Therapy: WFL for tasks assessed/performed Overall Cognitive Status: Within Functional Limits for tasks assessed                                        General Comments      Exercises     Assessment/Plan    PT Assessment Patient needs continued PT services  PT Problem List Decreased strength;Decreased activity tolerance;Decreased balance;Decreased mobility       PT Treatment Interventions Balance training;DME instruction;Gait training;Stair training;Functional mobility training;Therapeutic activities;Therapeutic exercise;Patient/family education    PT Goals (Current goals can be found in the Care Plan section)  Acute Rehab PT Goals Patient Stated Goal: return home after rehab PT Goal Formulation: With patient/family Time For Goal Achievement: 09/27/20 Potential to Achieve Goals: Good    Frequency Min 3X/week   Barriers to discharge        Co-evaluation               AM-PAC PT "6 Clicks" Mobility  Outcome Measure Help needed turning from your back to your side while in a flat bed without using bedrails?: A Lot Help needed moving from lying on your back to  sitting on the side of a flat bed without using bedrails?: A Lot Help needed moving to and from a bed to a chair (including a wheelchair)?: A Lot Help needed standing up from a chair using your arms (e.g., wheelchair or bedside chair)?: A Lot Help needed to walk in hospital room?: A Lot Help needed climbing 3-5 steps with a railing? : Total 6 Click Score: 11    End of Session   Activity Tolerance: Patient tolerated treatment well;Patient limited by fatigue Patient left: in chair;with call bell/phone within reach;with family/visitor present Nurse Communication: Mobility status PT Visit Diagnosis: Unsteadiness on feet (R26.81);Other abnormalities of gait and mobility (R26.89);Muscle weakness (generalized) (M62.81)    Time: [id number] PT Time Calculation (min) (ACUTE ONLY): 29 min   Charges:   PT Evaluation $PT Eval Moderate Complexity: 1 Mod PT Treatments $Therapeutic Activity: 23-37 mins        1:46 PM, 09/13/20 Lonell Grandchild, MPT Physical Therapist with [facility] [phone] office [phone] mobile phone

## 2020-09-13 NOTE — TOC Initial Note (Signed)
Transition of Care [facility]) - Initial/Assessment Note    Patient Details  Name: Joshua [name] MRN: [id number] Date of Birth: Jan 24, 1932  Transition of Care [facility]) CM/SW Contact:    Barry Brunner, LCSW Phone Number: 09/13/2020, 4:07 PM  Clinical Narrative:                 Patient is a 86 year old male admitted for Acute on chronic diastolic congestive heart failure. CSW received SNF consult CHF consult, observed high readmission risk score. CSW conducted SNF consult, CHF consult, and risk assessment. Patient's daughter reported that patient would be agreeable to SNF referral to [facility]. CSW inquired about additional SNF referrals. Patient's daughter reported that at the time, they would only like a [facility] referral. CSW completed FL2 and referred patient. Patient's daughter reported that patient adheres to a heart healthy diet and has made previous attempts to reduce his sodium intake. Patient also weighs himself daily. PCP appt will be scheduled pending discharge. TOC to follow.   Expected Discharge Plan: Skilled Nursing Facility Barriers to Discharge: Continued Medical Work up   Patient Goals and CMS Choice Patient states their goals for this hospitalization and ongoing recovery are:: SNF CMS Medicare.gov Compare Post Acute Care list provided to:: Patient Choice offered to / list presented to : Patient  Expected Discharge Plan and Services Expected Discharge Plan: Skilled Nursing Facility In-house Referral: NA Discharge Planning Services: NA Post Acute Care Choice: NA                   DME Arranged: N/A DME Agency: NA       HH Arranged: NA HH Agency: NA        Prior Living Arrangements/Services   Lives with:: Self,Adult Children Patient language and need for interpreter reviewed:: Yes Do you feel safe going back to the place where you live?: Yes      Need for Family Participation in Patient Care: Yes (Comment) Care giver support system in place?: Yes (comment)    Criminal Activity/Legal Involvement Pertinent to Current Situation/Hospitalization: No - Comment as needed  Activities of Daily Living Home Assistive Devices/Equipment: Oxygen,Walker (specify type) ADL Screening (condition at time of admission) Patient's cognitive ability adequate to safely complete daily activities?: Yes Is the patient deaf or have difficulty hearing?: No Does the patient have difficulty seeing, even when wearing glasses/contacts?: No Does the patient have difficulty concentrating, remembering, or making decisions?: No Patient able to express need for assistance with ADLs?: Yes Does the patient have difficulty dressing or bathing?: No Independently performs ADLs?: Yes (appropriate for developmental age) Does the patient have difficulty walking or climbing stairs?: Yes Weakness of Legs: Both Weakness of Arms/Hands: None  Permission Sought/Granted   Permission granted to share information with : Yes, Verbal Permission Granted     Permission granted to share info w AGENCY: [facility]  Permission granted to share info w Relationship: Patient's daughter     Emotional Assessment       Orientation: : Oriented to Self,Oriented to Situation,Oriented to Place,Oriented to  Time Alcohol / Substance Use: Not Applicable Psych Involvement: No (comment)  Admission diagnosis:  Acute on chronic systolic heart failure (HCC) [I50.23] [place] [K92.1] SOB (shortness of breath) [R06.02] Acute on chronic diastolic congestive heart failure (HCC) [I50.33] Chronic respiratory failure with hypoxia (HCC) [J96.11] Anemia, unspecified type [D64.9] Acute heart failure (HCC) [I50.9] Patient Active Problem List   Diagnosis Date Noted  . Acute heart failure (HCC) 09/13/2020  . Acute on chronic  diastolic congestive heart failure ([place]) 09/12/2020  . Macrocytic anemia 09/12/2020  . Functional quadriplegia ([place]) 09/12/2020  . CAD (coronary artery disease)   . Heme positive stool   .  Anemia   . Chronic respiratory failure with hypoxia ([place])   . Right lower lobe pneumonia 06/22/2020  . CHF exacerbation ([place]) 06/22/2020  . Elevated troponin 06/22/2020  . Chronic diastolic heart failure ([place]) 11/25/2019  . High risk medication use 07/19/2019  . Pseudophakia, both eyes 09/20/2018  . Exudative age-related macular degeneration ([place]) 09/20/2018  . Second degree AV block 06/15/2017  . Aortic atherosclerosis ([place]) 06/03/2016  . General weakness   . Old MI (myocardial infarction) 04/27/2016  . Bladder cancer ([place]) 04/18/2016  . Vitamin D deficiency 10/15/2015  . Prostate cancer ([place])   . Hypercholesterolemia   . Essential hypertension   . Seizures ([place])   . GERD 08/22/2008  . COLONIC POLYPS, ADENOMATOUS, HX OF 08/22/2008   PCP:  Chevis Pretty, FNP Pharmacy:   [place], [place] [address] [place] [address] Phone: [phone] Fax: [place], [place] [address] Phone: [phone] Fax: [phone]     Social Determinants of Health (SDOH) Interventions    Readmission Risk Interventions Readmission Risk Prevention Plan 09/13/2020  Transportation Screening Complete  PCP or Specialist Appt within 3-5 Days Not Complete  Not Complete comments Will need appt closer to discharge  [place] or [place] Complete  Social Work Consult for [place] Planning/Counseling Complete  Palliative Care Screening Not Applicable  Medication Review Press photographer) Complete  Some recent data might be hidden

## 2020-09-13 NOTE — Progress Notes (Signed)
PROGRESS NOTE   Joshua [name]  HWE:[id number] DOB: Feb 16, 1932 DOA: 09/12/2020 PCP: Chevis Pretty, FNP   Chief Complaint  Patient presents with  . Shortness of Breath    Brief Admission History:  86 y.o. male with medical history significant of intracerebral AV malformation with a history of intracerebral bleed and seizure treated with craniotomy in 1980, osteoarthritis, lower extremity edema, bladder cancer, CAD, COPD, Mobitz 1 arrhythmia, pacemaker placement GERD, hyperlipidemia, hypertension, macular degeneration, vitamin D deficiency who is coming to the emergency department due to progressively worse dyspnea for the past 2 days and an episode of dark tarry looking stools yesterday.  He takes a daily iron supplement, but states that usually the stools do not look as dark.  He denies abdominal pain, nausea, emesis, diarrhea, constipation or BRBPR.  He mentions that he has been taking Aleve nearly daily for the past year.  No fever, chills, but positive generalized weakness.  No wheezing or hemoptysis.  No chest pain, palpitations, diaphoresis, dysuria, frequency or hematuria.  No polyuria, polydipsia, polyphagia or blurred vision.  ED Course: Initial vital signs were temperature 98 F, pulse 87, respirations 23, BP 150/82 mmHg O2 sat 96% on room air.  The patient received 40 mg of furosemide IVP and 40 mg of pantoprazole IVPB.  Assessment & Plan:   Principal Problem:   Acute on chronic diastolic congestive heart failure (HCC) Active Problems:   GERD   Hypercholesterolemia   Essential hypertension   Seizures (HCC)   Macrocytic anemia   CAD (coronary artery disease)   Heme positive stool   Functional quadriplegia (HCC)   Acute heart failure ([place])   1. Acute on chronic diastolic CHF - Pt has been diuresing with IV lasix 20 mg daily, continue spironolactone 25 mg daily, fluid restriction, following daily weights and electrolytes.  Repeated CXR today shows improvement of pulmonary  edema.  2. Guaic positive stools  - hg initially trended down and now stabilized around 7.2.  He remains on IV protonix and GI consult was requested.   3. Essential hypertension - stable, following closely.   4. Epilepsy - No recent seizures, resumed his home phenytoin and phenobarbitol.  5. Hyperlipidemia - resumed home atorvastatin.  6. Leukocytosis - resolved now.  7. GERD - protonix for GI protection.   DVT prophylaxis:  SCD Code Status: full  Family Communication:  Disposition: Daughter adamant patient transfer to [facility], says EMS had refused her initial request, transfer to [facility] due to family insistence, updated AC Tim   Status is: Inpatient  Remains inpatient appropriate because:IV treatments appropriate due to intensity of illness or inability to take PO and Inpatient level of care appropriate due to severity of illness   Dispo: The patient is from: Home              Anticipated d/c is to: Home              Anticipated d/c date is: 2 days              Patient currently is not medically stable to d/c.  Consultants:   GI   Procedures:   N/a   Antimicrobials:  N/a    Subjective: Pt reports he feels a little better today.  He is urinating quite a lot. No chest pain and no SOB.   Objective: Vitals:   09/13/20 0900 09/13/20 1000 09/13/20 1100 09/13/20 1200  BP: (!) 117/52 127/62 (!) 102/52 (!) 126/55  Pulse: 62 67 76 73  Resp: Marland Kitchen)  23  (!) 26 20  Temp:      TempSrc:      SpO2: 96% (!) 89% (!) 86% 92%  Weight:      Height:        Intake/Output Summary (Last 24 hours) at 09/13/2020 1234 Last data filed at 09/13/2020 1000 Gross per 24 hour  Intake 440 ml  Output --  Net 440 ml   Filed Weights   09/12/20 0002 09/12/20 1624 09/13/20 0547  Weight: 77.1 kg 80.8 kg 78.9 kg    Examination:  General exam: elderly male, awake, alert, NAD, Appears calm and comfortable  Respiratory system: bibasilar crackles. Respiratory effort normal. Cardiovascular system: normal S1  & S2 heard. No JVD, murmurs, rubs, gallops or clicks. No pedal edema. Gastrointestinal system: Abdomen is nondistended, soft and nontender. No organomegaly or masses felt. Normal bowel sounds heard. Central nervous system: Alert and oriented. No focal neurological deficits. Extremities: Symmetric 5 x 5 power. Skin: No rashes, lesions or ulcers Psychiatry: Judgement and insight appear normal. Mood & affect appropriate.   Data Reviewed: I have personally reviewed following labs and imaging studies  CBC: Recent Labs  Lab 09/12/20 0015 09/12/20 0644 09/12/20 0957 09/13/20 0337  WBC 13.2*  --  10.7* 9.6  NEUTROABS 9.8*  --   --   --   HGB 8.3* 7.1* 7.2* 7.3*  HCT 26.9*  --  24.9* 23.8*  MCV 105.5*  --  112.2* 104.4*  PLT 318  --  267 [id number]    Basic Metabolic Panel: Recent Labs  Lab 09/12/20 0015 09/13/20 0337  NA 137 140  K 4.2 3.6  CL 102 103  CO2 25 26  GLUCOSE 181* 104*  BUN 43* 42*  CREATININE 0.98 0.88  CALCIUM 9.1 8.6*  MG 2.4 2.2    GFR: Estimated Creatinine Clearance: 66.1 mL/min (by C-G formula based on SCr of 0.88 mg/dL).  Liver Function Tests: Recent Labs  Lab 09/12/20 0644 09/13/20 0337  AST 66* 102*  ALT 72* 109*  ALKPHOS 130* 134*  BILITOT 0.7 0.8  PROT 6.0* 5.7*  ALBUMIN 3.4* 3.2*    CBG: No results for input(s): GLUCAP in the last 168 hours.  Recent Results (from the past 240 hour(s))  Resp Panel by RT-PCR (Flu A&B, Covid) Nasopharyngeal Swab     Status: None   Collection Time: 09/12/20 12:14 AM   Specimen: Nasopharyngeal Swab; Nasopharyngeal(NP) swabs in vial transport medium  Result Value Ref Range Status   SARS Coronavirus 2 by RT PCR NEGATIVE NEGATIVE Final    Comment: (NOTE) SARS-CoV-2 target nucleic acids are NOT DETECTED.  The SARS-CoV-2 RNA is generally detectable in upper respiratory specimens during the acute phase of infection. The lowest concentration of SARS-CoV-2 viral copies this assay can detect is 138 copies/mL. A  negative result does not preclude SARS-Cov-2 infection and should not be used as the sole basis for treatment or other patient management decisions. A negative result may occur with  improper specimen collection/handling, submission of specimen other than nasopharyngeal swab, presence of viral mutation(s) within the areas targeted by this assay, and inadequate number of viral copies(<138 copies/mL). A negative result must be combined with clinical observations, patient history, and epidemiological information. The expected result is Negative.  Fact Sheet for Patients:  EntrepreneurPulse.com.au  Fact Sheet for Healthcare Providers:  IncredibleEmployment.be  This test is no t yet approved or cleared by the [place] FDA and  has been authorized for detection and/or diagnosis of SARS-CoV-2 by FDA under  an Emergency Use Authorization (EUA). This EUA will remain  in effect (meaning this test can be used) for the duration of the COVID-19 declaration under Section 564(b)(1) of the Act, 21 U.S.C.section 360bbb-3(b)(1), unless the authorization is terminated  or revoked sooner.       Influenza A by PCR NEGATIVE NEGATIVE Final   Influenza B by PCR NEGATIVE NEGATIVE Final    Comment: (NOTE) The Xpert Xpress SARS-CoV-2/FLU/RSV plus assay is intended as an aid in the diagnosis of influenza from Nasopharyngeal swab specimens and should not be used as a sole basis for treatment. Nasal washings and aspirates are unacceptable for Xpert Xpress SARS-CoV-2/FLU/RSV testing.  Fact Sheet for Patients: EntrepreneurPulse.com.au  Fact Sheet for Healthcare Providers: IncredibleEmployment.be  This test is not yet approved or cleared by the [place] FDA and has been authorized for detection and/or diagnosis of SARS-CoV-2 by FDA under an Emergency Use Authorization (EUA). This EUA will remain in effect (meaning this test can  be used) for the duration of the COVID-19 declaration under Section 564(b)(1) of the Act, 21 U.S.C. section 360bbb-3(b)(1), unless the authorization is terminated or revoked.  Performed at [facility], [address] [place], [place] [address]   MRSA PCR Screening     Status: None   Collection Time: 09/12/20  4:15 PM   Specimen: Nasal Mucosa; Nasopharyngeal  Result Value Ref Range Status   MRSA by PCR NEGATIVE NEGATIVE Final    Comment:        The GeneXpert MRSA Assay (FDA approved for NASAL specimens only), is one component of a comprehensive MRSA colonization surveillance program. It is not intended to diagnose MRSA infection nor to guide or monitor treatment for MRSA infections. Performed at [facility], [address] [place], [place] [address]      Radiology Studies: DG CHEST PORT 1 VIEW  Result Date: 09/13/2020 CLINICAL DATA:  Pulmonary edema EXAM: PORTABLE CHEST - 1 VIEW COMPARISON:  the previous day's study FINDINGS: Mild interstitial edema or infiltrates, improved since previous. Improved left retrocardiac aeration. Stable cardiomegaly. Aortic Atherosclerosis (ICD10-170.0). Stable left subclavian dual lead transvenous pacemaker. Blunting of lateral costophrenic angles as before.  No pneumothorax. Mild diffuse osteopenia. IMPRESSION: Interval improvement in bilateral edema/infiltrates. Electronically Signed   By: Lucrezia Europe M.D.   On: 09/13/2020 08:16   DG Chest Portable 1 View  Result Date: 09/12/2020 CLINICAL DATA:  Initial evaluation for acute shortness of breath. EXAM: PORTABLE CHEST 1 VIEW COMPARISON:  Prior radiograph from 08/20/2020. FINDINGS: Right-sided pacemaker/AICD in place. Cardiomegaly, stable. Mediastinal silhouette normal. Aortic atherosclerosis. Lungs hypoinflated. Small layering bilateral pleural effusions with associated bibasilar atelectasis. Diffuse prominence of the pulmonary vasculature and interstitial markings, favored to reflect mild pulmonary  interstitial congestion/edema. Atypical pneumonitis could also have this appearance. No pneumothorax. No acute osseous finding. IMPRESSION: 1. Cardiomegaly with diffuse prominence of the pulmonary vasculature and interstitial markings, favored to reflect mild pulmonary interstitial congestion/edema. Possible atypical pneumonitis could be considered in the correct clinical setting. 2. Superimposed small layering bilateral pleural effusions with associated bibasilar atelectasis. 3.  Aortic Atherosclerosis (ICD10-I70.0). Electronically Signed   By: Jeannine Boga M.D.   On: 09/12/2020 00:32   Scheduled Meds: . atorvastatin  20 mg Oral QPM  . calcium carbonate  500 mg of elemental calcium Oral Q breakfast  . Chlorhexidine Gluconate Cloth  6 each Topical Daily  . cholecalciferol  2,000 Units Oral Daily  . citalopram  20 mg Oral Daily  . ferrous sulfate  325 mg Oral Q  breakfast  . furosemide  20 mg Intravenous Daily  . lisinopril  2.5 mg Oral Daily  . multivitamin-lutein  1 capsule Oral BID  . pantoprazole (PROTONIX) IV  40 mg Intravenous Q12H  . PHENobarbital  64.8 mg Oral BID  . phenytoin  100 mg Oral TID PC & HS  . spironolactone  25 mg Oral Daily   Continuous Infusions:   LOS: 0 days   Time spent: 37 mins  Hajira Verhagen Wynetta Emery, MD How to contact the [facility] Attending or Consulting provider [place] or covering provider during after hours [place], for this patient?  1. Check the care team in [facility] and look for a) attending/consulting TRH provider listed and b) the [facility] 2 team listed 2. Log into [email or web address] and use 's universal password to access. If you do not have the password, please contact the hospital operator. 3. Locate the [facility] provider you are looking for under [facility] and page to a number that you can be directly reached. 4. If you still have difficulty reaching the provider, please page the [facility] (Director on Call) for the Hospitalists listed on amion for  assistance.  09/13/2020, 12:34 PM

## 2020-09-13 NOTE — Progress Notes (Signed)
Report called to [place], RN on 3- [place] just left with patient.

## 2020-09-13 NOTE — Plan of Care (Signed)
  Problem: Acute Rehab PT Goals(only PT should resolve) Goal: Pt Will Go Supine/Side To Sit Outcome: Progressing Flowsheets (Taken 09/13/2020 1347) Pt will go Supine/Side to Sit:  with minimal assist  with moderate assist Goal: Patient Will Transfer Sit To/From Stand Outcome: Progressing Flowsheets (Taken 09/13/2020 1347) Patient will transfer sit to/from stand: with moderate assist Goal: Pt Will Transfer Bed To Chair/Chair To Bed Outcome: Progressing Flowsheets (Taken 09/13/2020 1347) Pt will Transfer Bed to Chair/Chair to Bed: with mod assist Goal: Pt Will Ambulate Outcome: Progressing Flowsheets (Taken 09/13/2020 1347) Pt will Ambulate:  15 feet  with moderate assist  with rolling walker   1:48 PM, 09/13/20 Lonell Grandchild, MPT Physical Therapist with [facility] [phone] office [phone] mobile phone

## 2020-09-13 NOTE — NC FL2 (Addendum)
[place] MEDICAID FL2 LEVEL OF CARE SCREENING TOOL     IDENTIFICATION  Patient Name: Joshua [name] Birthdate: 01-10-1932 Sex: male Admission Date (Current Location): 09/12/2020  [facility] and [place] Number:  [employer] and Address:  [place] [address], [place]      Provider Number: [id number]  Attending Physician Name and Address:  Murlean Iba, MD  Relative Name and Phone Number:  [place], [place]" [facility])   [phone]    Current Level of Care: SNF Recommended Level of Care: [place] Prior Approval Number: TBD  Date Approved/Denied:   PASRR Number:   [id number]  Discharge Plan: SNF    Current Diagnoses: Patient Active Problem List   Diagnosis Date Noted  . Acute heart failure ([place]) 09/13/2020  . Acute on chronic diastolic congestive heart failure ([place]) 09/12/2020  . Macrocytic anemia 09/12/2020  . Functional quadriplegia ([place]) 09/12/2020  . CAD (coronary artery disease)   . Heme positive stool   . Anemia   . Chronic respiratory failure with hypoxia ([place])   . Right lower lobe pneumonia 06/22/2020  . CHF exacerbation ([place]) 06/22/2020  . Elevated troponin 06/22/2020  . Chronic diastolic heart failure ([place]) 11/25/2019  . High risk medication use 07/19/2019  . Pseudophakia, both eyes 09/20/2018  . Exudative age-related macular degeneration ([place]) 09/20/2018  . Second degree AV block 06/15/2017  . Aortic atherosclerosis ([place]) 06/03/2016  . General weakness   . Old MI (myocardial infarction) 04/27/2016  . Bladder cancer ([place]) 04/18/2016  . Vitamin D deficiency 10/15/2015  . Prostate cancer ([place])   . Hypercholesterolemia   . Essential hypertension   . Seizures ([place])   . GERD 08/22/2008  . COLONIC POLYPS, ADENOMATOUS, HX OF 08/22/2008    Orientation RESPIRATION BLADDER Height & Weight     Self,Time,Situation,Place  Normal Continent Weight: 173 lb 15.1 oz (78.9 kg) Height:  6' (182.9 cm)   BEHAVIORAL SYMPTOMS/MOOD NEUROLOGICAL BOWEL NUTRITION STATUS      Continent Diet (DYS 3 Room service appropriate? Yes; Fluid consistency: Thin)  AMBULATORY STATUS COMMUNICATION OF NEEDS Skin   Extensive Assist   Normal                       Personal Care Assistance Level of Assistance  Bathing,Feeding,Dressing Bathing Assistance: Maximum assistance Feeding assistance: Limited assistance Dressing Assistance: Limited assistance     Functional Limitations Info  Sight,Hearing,Speech Sight Info: Impaired Hearing Info: Adequate Speech Info: Adequate    SPECIAL CARE FACTORS FREQUENCY  PT (By licensed PT)     PT Frequency: 5x              Contractures Contractures Info: Not present    Additional Factors Info  Code Status Code Status Info: Full             Current Medications (09/13/2020):  This is the current hospital active medication list Current Facility-Administered Medications  Medication Dose Route Frequency Provider Last Rate Last Admin  . acetaminophen (TYLENOL) tablet 650 mg  650 mg Oral Q6H PRN Reubin Milan, MD       Or  . acetaminophen (TYLENOL) suppository 650 mg  650 mg Rectal Q6H PRN Reubin Milan, MD      . albuterol (PROVENTIL) (2.5 MG/3ML) 0.083% nebulizer solution 3 mL  3 mL Inhalation Q6H PRN Johnson, Clanford L, MD      . atorvastatin (LIPITOR) tablet 20 mg  20 mg Oral QPM Johnson, Clanford L,  MD   20 mg at 09/12/20 1734  . calcium carbonate (OS-CAL - dosed in mg of elemental calcium) tablet 500 mg of elemental calcium  500 mg of elemental calcium Oral Q breakfast Johnson, Clanford L, MD   500 mg of elemental calcium at 09/13/20 0906  . Chlorhexidine Gluconate Cloth 2 % PADS 6 each  6 each Topical Daily Murlean Iba, MD   6 each at 09/13/20 [phone]  . cholecalciferol (VITAMIN D) tablet 2,000 Units  2,000 Units Oral Daily Irwin Brakeman L, MD   2,000 Units at 09/13/20 0904  . citalopram (CELEXA) tablet 20 mg  20 mg Oral Daily  Johnson, Clanford L, MD   20 mg at 09/13/20 0907  . ferrous sulfate tablet 325 mg  325 mg Oral Q breakfast Wynetta Emery, Clanford L, MD   325 mg at 09/13/20 4403  . furosemide (LASIX) injection 20 mg  20 mg Intravenous Daily Reubin Milan, MD   20 mg at 09/13/20 4742  . lisinopril (ZESTRIL) tablet 2.5 mg  2.5 mg Oral Daily Reubin Milan, MD   2.5 mg at 09/13/20 5956  . multivitamin-lutein (OCUVITE-LUTEIN) capsule 1 capsule  1 capsule Oral BID Wynetta Emery, Clanford L, MD   1 capsule at 09/13/20 0904  . ondansetron (ZOFRAN) tablet 4 mg  4 mg Oral Q6H PRN Reubin Milan, MD       Or  . ondansetron [facility]) injection 4 mg  4 mg Intravenous Q6H PRN Reubin Milan, MD      . pantoprazole (PROTONIX) injection 40 mg  40 mg Intravenous Q12H Reubin Milan, MD   40 mg at 09/13/20 3875  . PHENobarbital (LUMINAL) tablet 64.8 mg  64.8 mg Oral BID Johnson, Clanford L, MD   64.8 mg at 09/13/20 1132  . phenytoin (DILANTIN) ER capsule 100 mg  100 mg Oral TID PC & HS Johnson, Clanford L, MD   100 mg at 09/13/20 1339  . polyvinyl alcohol (LIQUIFILM TEARS) 1.4 % ophthalmic solution 2 drop  2 drop Both Eyes Daily PRN Johnson, Clanford L, MD      . spironolactone (ALDACTONE) tablet 25 mg  25 mg Oral Daily Johnson, Clanford L, MD   25 mg at 09/13/20 6433     Discharge Medications: Please see discharge summary for a list of discharge medications.  Relevant Imaging Results:  Relevant Lab Results:   Additional Information Pt SSN [id number]  Natasha Bence, LCSW

## 2020-09-13 NOTE — Progress Notes (Signed)
[facility] called to let nursing staff know they are en route, ETA is 20 mins. Daughter Vaughan Basta who has been here all day visiting called and notified of patient having a bed and leaving in the next little bit. Will call report to [facility] shortly.

## 2020-09-14 ENCOUNTER — Inpatient Hospital Stay (HOSPITAL_COMMUNITY): Payer: Medicare Other

## 2020-09-14 ENCOUNTER — Encounter (HOSPITAL_COMMUNITY): Payer: Self-pay | Admitting: Family Medicine

## 2020-09-14 DIAGNOSIS — R195 Other fecal abnormalities: Secondary | ICD-10-CM | POA: Diagnosis not present

## 2020-09-14 DIAGNOSIS — R0602 Shortness of breath: Secondary | ICD-10-CM | POA: Diagnosis not present

## 2020-09-14 DIAGNOSIS — D649 Anemia, unspecified: Secondary | ICD-10-CM

## 2020-09-14 DIAGNOSIS — D539 Nutritional anemia, unspecified: Secondary | ICD-10-CM

## 2020-09-14 DIAGNOSIS — I5033 Acute on chronic diastolic (congestive) heart failure: Secondary | ICD-10-CM

## 2020-09-14 DIAGNOSIS — R532 Functional quadriplegia: Secondary | ICD-10-CM | POA: Diagnosis not present

## 2020-09-14 DIAGNOSIS — Z7189 Other specified counseling: Secondary | ICD-10-CM | POA: Diagnosis not present

## 2020-09-14 DIAGNOSIS — R569 Unspecified convulsions: Secondary | ICD-10-CM

## 2020-09-14 DIAGNOSIS — J811 Chronic pulmonary edema: Secondary | ICD-10-CM

## 2020-09-14 DIAGNOSIS — K921 Melena: Secondary | ICD-10-CM

## 2020-09-14 DIAGNOSIS — I2510 Atherosclerotic heart disease of native coronary artery without angina pectoris: Secondary | ICD-10-CM | POA: Diagnosis not present

## 2020-09-14 DIAGNOSIS — J9611 Chronic respiratory failure with hypoxia: Secondary | ICD-10-CM | POA: Diagnosis not present

## 2020-09-14 LAB — COMPREHENSIVE METABOLIC PANEL
ALT: 115 U/L — ABNORMAL HIGH (ref 0–44)
AST: 83 U/L — ABNORMAL HIGH (ref 15–41)
Albumin: 3 g/dL — ABNORMAL LOW (ref 3.5–5.0)
Alkaline Phosphatase: 144 U/L — ABNORMAL HIGH (ref 38–126)
Anion gap: 11 (ref 5–15)
BUN: 39 mg/dL — ABNORMAL HIGH (ref 8–23)
CO2: 26 mmol/L (ref 22–32)
Calcium: 8.7 mg/dL — ABNORMAL LOW (ref 8.9–10.3)
Chloride: 102 mmol/L (ref 98–111)
Creatinine, Ser: 1.05 mg/dL (ref 0.61–1.24)
GFR, Estimated: >60 mL/min (ref >60)
Glucose, Bld: 167 mg/dL — ABNORMAL HIGH (ref 70–99)
Potassium: 3.5 mmol/L (ref 3.5–5.1)
Sodium: 139 mmol/L (ref 135–145)
Total Bilirubin: 0.8 mg/dL (ref 0.3–1.2)
Total Protein: 5.6 g/dL — ABNORMAL LOW (ref 6.5–8.1)

## 2020-09-14 LAB — CBC
HCT: 25 % — ABNORMAL LOW (ref 39.0–52.0)
Hemoglobin: 8.1 g/dL — ABNORMAL LOW (ref 13.0–17.0)
MCH: 33.1 pg (ref 26.0–34.0)
MCHC: 32.4 g/dL (ref 30.0–36.0)
MCV: 102 fL — ABNORMAL HIGH (ref 80.0–100.0)
Platelets: 263 10*3/uL (ref 150–400)
RBC: 2.45 MIL/uL — ABNORMAL LOW (ref 4.22–5.81)
RDW: 20.7 % — ABNORMAL HIGH (ref 11.5–15.5)
WBC: 11.8 10*3/uL — ABNORMAL HIGH (ref 4.0–10.5)
nRBC: 1.4 % — ABNORMAL HIGH (ref 0.0–0.2)

## 2020-09-14 LAB — BRAIN NATRIURETIC PEPTIDE: B Natriuretic Peptide: 849.9 pg/mL — ABNORMAL HIGH (ref 0.0–100.0)

## 2020-09-14 LAB — PROTIME-INR
INR: 1.4 — ABNORMAL HIGH (ref 0.8–1.2)
Prothrombin Time: 16.3 seconds — ABNORMAL HIGH (ref 11.4–15.2)

## 2020-09-14 LAB — CORTISOL: Cortisol, Plasma: 20.6 ug/dL

## 2020-09-14 LAB — MAGNESIUM: Magnesium: 2 mg/dL (ref 1.7–2.4)

## 2020-09-14 MED ORDER — FUROSEMIDE 10 MG/ML IJ SOLN
40.0000 mg | Freq: Once | INTRAMUSCULAR | Status: AC
  Administered 2020-09-14: 17:00:00 40 mg via INTRAVENOUS
  Filled 2020-09-14: qty 4

## 2020-09-14 MED ORDER — SODIUM CHLORIDE 0.9 % IV SOLN: 25.0000 mg | Freq: Once | INTRAVENOUS | Status: DC

## 2020-09-14 MED ORDER — MUSCLE RUB 10-15 % EX CREA
1.0000 "application " | TOPICAL_CREAM | CUTANEOUS | Status: DC | PRN
  Filled 2020-09-14: qty 85

## 2020-09-14 MED ORDER — SODIUM CHLORIDE 0.9 % IV SOLN: 100.0000 mg | Freq: Once | INTRAVENOUS | Status: DC

## 2020-09-14 MED ORDER — IOHEXOL 300 MG/ML  SOLN
100.0000 mL | Freq: Once | INTRAMUSCULAR | Status: AC | PRN
  Administered 2020-09-14: 22:00:00 100 mL via INTRAVENOUS

## 2020-09-14 MED ORDER — FERROUS SULFATE 325 (65 FE) MG PO TABS
325.0000 mg | ORAL_TABLET | Freq: Two times a day (BID) | ORAL | Status: DC
  Administered 2020-09-15: 08:00:00 325 mg via ORAL
  Filled 2020-09-14: qty 1

## 2020-09-14 MED ORDER — SODIUM CHLORIDE 0.9 % IV SOLN
250.0000 mg | Freq: Once | INTRAVENOUS | Status: AC
  Administered 2020-09-14: 20:00:00 250 mg via INTRAVENOUS
  Filled 2020-09-14: qty 20

## 2020-09-14 MED ORDER — POTASSIUM CHLORIDE CRYS ER 20 MEQ PO TBCR
20.0000 meq | EXTENDED_RELEASE_TABLET | Freq: Three times a day (TID) | ORAL | Status: AC
  Administered 2020-09-14 – 2020-09-15 (×3): 20 meq via ORAL
  Filled 2020-09-14 (×3): qty 1

## 2020-09-14 MED ORDER — IOHEXOL 9 MG/ML PO SOLN
500.0000 mL | ORAL | Status: AC
  Administered 2020-09-14 (×2): 500 mL via ORAL

## 2020-09-14 MED ORDER — CAMPHOR-MENTHOL 0.5-0.5 % EX LOTN
1.0000 "application " | TOPICAL_LOTION | CUTANEOUS | Status: DC | PRN
  Filled 2020-09-14: qty 222

## 2020-09-14 MED ORDER — SODIUM CHLORIDE 0.9 % IV SOLN: 75.0000 mg | Freq: Once | INTRAVENOUS | Status: DC

## 2020-09-14 NOTE — Consult Note (Addendum)
Cardiology Consultation:   Patient ID: Joshua [name] MRN: [id number]; DOB: 06-19-1932  Admit date: 09/12/2020 Date of Consult: 09/14/2020  Primary Care Provider: Bennie Pierini, FNP [facility] Cardiologist: Lesleigh Noe, MD  [facility] Electrophysiologist:  None    Patient Profile:   Joshua [name] is a 86 y.o. male with a PMH of CAD s/p PCI/DES to RCA x2 in 2017, HTN, HLD, high grade AV block s/p PPM in 2018 with replacement in 2019 due to infection, GERD, COPD, ILD on home O2 3-4L at baseline, intracerebral AV malformation c/b bleed and seizures treated with craniotomy in 1980, macular degeneration, prostate cancers s/p seed implant, and bladder cancer, who is being seen today for the evaluation of CHF at the request of Dr. Butler Denmark per family request.  History of Present Illness:   [name] was in his usual state of health until a couple weeks ago when he began experiencing progressive SOB. He was started on prednisone and azithromycin by his pulmonologist, however he continued to have progressive symptoms prompting him to present to the ED for further evaluation.  He also noted an episode of dark tarry looking stools the day prior to admission. Initial work up included an EKG with paced rhythm, electrolytes wnl, Cr 0.98, WBC 13.2, Hgb 8.3>7.1 (down from baseline 11-12), PLT 318, BNP 983, occult stool heme positive, COVID-19/flu negative, and CXR with diffuse prominence of the pulmonary vasculature and interstitial markings c/w interstitial congestion/edema vs atypical pneumonitis. He was started on IV PPI and IV lasix in the ED and admitted to medicine.   Prior to this admission his last echocardiogram 06/2020 showed EF 50-55%, G1DD, no RWMA, mild concentric LVH, mildly reduced RV systolic function, moderately elevated PA pressures, mild RAE, mild MR, and mild dilation of the aortic root. He was last evaluated by cardiology at an outpatient visit with Norma Fredrickson, NP  07/08/2020 at which time he was doing okay from a cardiac standpoint after a recent hospitalization for PNA and being discharged on home O2.   Her last device interrogation 09/09/20 showed possible 26 AT/AF events, though on Dr. Amedeo Plenty review no changes made and ongoing routine follow-up recommended.   He was diuresed with IV lasix daily with essentially net nil volume status if I&Os accurate. Felt to be euvolemic on exam without ongoing crackles likely 2/2 ILD. His Hgb dropped to 7.1 09/12/20, but up to 8.1 today without transfusion. His home aspirin and plavix were held. GI consulted today given heme positive stools.   At the time of this evaluation he reports no significant change in his breathing since admission. His SOB is exclusively with activity and has been progressively worsening over the past couple weeks. Initially started 6 weeks ago and he was given a z-pak which initially helped his SOB, however once he completed his prednisone and antibiotics his symptoms returned.  He denies chest pain, SOB at rest, orthopnea, PND, LE edema, weight gain, dizziness, lightheadedness, or syncope. At the time of his MI in 2017 he reported sudden onset profound fatigue and diaphoresis, though denies any recent symptoms reminiscent of this. He has a chronic cough with white/clear sputum, though no fevers. He reports chronic dark stools which he attributed to his iron use, though stools became tarry the day prior to admission.      Past Medical History:  Diagnosis Date  . 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 Arthritis   . Bilateral lower extremity  edema   . CAD (coronary artery disease) cardiologist--  dr Daneen Schick   a. 04/27/2016 PCI with DES to RCA with 50% ostial to 60% segmental mid to distal left main, and 90% thrombus filled ostial to proximal circumflex, with distal right coronary filled by collaterals from left-to-right  . COPD with  emphysema ([place])   . Dyspnea on exertion   . ED (erectile dysfunction)   . First degree AV block   . GERD (gastroesophageal reflux disease)   . History of adenomatous polyp of colon    09-22-2008  . History of bladder cancer    s/p  resection bladder tumor 04-19-2016  non-invasive low-grade urothelial carcinoma  . History of prostate cancer urologist-  dr Jeffie Pollock-- last PSA 0.02 (summer 2017)   dx 02/ 2013-- Stage T2a, Gleason 7, PSA 1.58--  s/p  radioactive prostate seed implants 01-19-2012  . History of ST elevation myocardial infarction (STEMI)    04-27-2016  . Hyperlipidemia   . Hypertension   . Macular degeneration   . Presence of permanent cardiac pacemaker   . S/P drug eluting coronary stent placement    04-27-2016   . Second degree Mobitz I AV block    occasional w/ first degree heart block  per cardiologist note by dr Daneen Schick  . Seizures ([place]) per pt son -- no seizure's since 1980   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  arteriorvenous cerebral malformation s/p  craniotomy 1980  . Vitamin D deficiency     Past Surgical History:  Procedure Laterality Date  . APPENDECTOMY  2011  . BLEPHAROPLASTY Bilateral revision 02-17-2016  . CARDIAC CATHETERIZATION N/A 04/27/2016   Procedure: Left Heart Cath and Coronary Angiography;  Surgeon: Belva Crome, MD;  Location: [place] [facility];  Service: Cardiovascular;  Laterality: N/A;  acute inferior wall STEMI ;  ostCx to midCx 90%,  ost1st Mrg to 1st Mrg 50%,  mid to distal RCA 100%,  pRCA 80%,  LVEF 45-50% w/ inferobasal akinesis  . CARDIAC CATHETERIZATION N/A 04/27/2016   Procedure: Temporary Pacemaker;  Surgeon: Belva Crome, MD;  Location: [place] [facility];  Service: Cardiovascular;  Laterality: N/A;  mobitz 2  second degree HB w/ heart rate 29bpm  . CARDIAC CATHETERIZATION N/A 04/27/2016   Procedure: Coronary Stent Intervention;  Surgeon: Belva Crome, MD;  Location: [place] [facility];  Service: Cardiovascular;   Laterality: N/A; DES x1 to  Proximal RCA;  DES x1 to Mid RCA  . CATARACT EXTRACTION W/ INTRAOCULAR LENS  IMPLANT, BILATERAL  2016  approx.  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 CYSTOSCOPY  01/19/2012   Procedure: CYSTOSCOPY;  Surgeon: Malka So, MD;  Location[facility];  Service: Urology;;  no seeds found in bladder  . CYSTOSCOPY W/ RETROGRADES Bilateral 04/19/2016   Procedure: CYSTOSCOPY WITH BILATERAL RETROGRADE PYELOGRAM TRANSURETHRAL RESECTION OF BLADDER TUMOR ;  Surgeon: [name], MD;  Location: [facility];  Service: Urology;  Laterality: Bilateral;  . CYSTOSCOPY WITH BIOPSY N/A 11/10/2016   Procedure: CYSTOSCOPY WITH BIOPSY AND FULGURATION;  Surgeon: [name], MD;  Location: [facility];  Service: Urology;  Laterality: N/A;  . INSERT / REPLACE / REMOVE PACEMAKER  06/15/2017  . LEAD EXTRACTION N/A 01/22/2018   Procedure: LEAD EXTRACTION;  Surgeon: Evans Lance, MD;  Location: [place] [facility];  Service: Cardiovascular;  Laterality: N/A;  . PACEMAKER IMPLANT N/A 06/15/2017  Procedure: Pacemaker Implant;  Surgeon: [name], MD;  Location: [place] [facility];  Service: Cardiovascular;  Laterality: N/A;  . PACEMAKER IMPLANT N/A 01/25/2018   Procedure: PACEMAKER IMPLANT;  Surgeon: Evans Lance, MD;  Location: [place] [facility];  Service: Cardiovascular;  Laterality: N/A;  . PPM GENERATOR REMOVAL N/A 01/22/2018   Procedure: PPM GENERATOR REMOVAL;  Surgeon: Evans Lance, MD;  Location: [place] [facility];  Service: Cardiovascular;  Laterality: N/A;  . RADIOACTIVE SEED IMPLANT  01/19/2012   Procedure: RADIOACTIVE SEED IMPLANT;  Surgeon: Malka So, MD;  Location: [facility];  Service: Urology;  Laterality: N/A;  68 seeds implanted  . TRANSTHORACIC ECHOCARDIOGRAM  06/02/2016   ef 60-65%/  trivial MR/  mild TR     Home Medications:  Prior to Admission medications   Medication Sig  Start Date End Date Taking? Authorizing Provider  albuterol (VENTOLIN HFA) 108 (90 Base) MCG/ACT inhaler Inhale 1-2 puffs into the lungs every 6 (six) hours as needed for wheezing or shortness of breath. 08/20/20  Yes Martyn Ehrich, NP  aspirin EC 81 MG tablet Take 81 mg by mouth daily. Swallow whole.   Yes [provider]  atorvastatin (LIPITOR) 20 MG tablet Take 1 tablet (20 mg total) by mouth daily. 07/01/20  Yes Martin, Mary-Margaret, FNP  azithromycin (ZITHROMAX) 250 MG tablet Take 1 tablet (250 mg total) by mouth as directed. 09/09/20  Yes Mannam, Praveen, MD  calcium carbonate (OSCAL) 1500 (600 Ca) MG TABS tablet Take 600 mg of elemental calcium by mouth daily with breakfast.   Yes [provider]  Cholecalciferol (VITAMIN D3) 50 MCG (2000 UT) capsule Take 2,000 Units by mouth daily.    Yes [provider]  citalopram (CELEXA) 20 MG tablet TAKE 1 TABLET DAILY 08/31/20  Yes Hassell Done, Mary-Margaret, FNP  clopidogrel (PLAVIX) 75 MG tablet Take 1 tablet (75 mg total) by mouth daily. 08/28/20  Yes Martin, Mary-Margaret, FNP  famotidine (PEPCID) 20 MG tablet Take 20 mg by mouth daily.    Yes [provider]  ferrous sulfate 325 (65 FE) MG tablet Take 1 tablet (325 mg total) by mouth daily with breakfast. 10/31/18  Yes Gatha Mayer, MD  furosemide (LASIX) 40 MG tablet Take 0.5 tablets (20 mg total) by mouth daily. 06/26/20 07/26/20 Yes Sheikh, Omair Latif, DO  MAGNESIUM PO Take 1 tablet by mouth at bedtime.   Yes [provider]  Multiple Vitamins-Minerals (PRESERVISION AREDS 2) CAPS Take 1 capsule by mouth 2 (two) times daily.    Yes [provider]  PHENobarbital (LUMINAL) 64.8 MG tablet Take 1 tablet (64.8 mg total) by mouth 2 (two) times daily. 07/01/20  Yes Hassell Done, Mary-Margaret, FNP  phenytoin (DILANTIN) 100 MG ER capsule Take 1 capsule (100 mg total) by mouth in the morning, at noon, in the evening, and at bedtime. 09/09/20  Yes Hassell Done,  Mary-Margaret, FNP  polyvinyl alcohol (LIQUIFILM TEARS) 1.4 % ophthalmic solution Place 2 drops into both eyes daily as needed for dry eyes.    Yes [provider]  predniSONE (DELTASONE) 20 MG tablet Take 1 tablet (20 mg total) by mouth daily with breakfast. 09/09/20  Yes Martyn Ehrich, NP  spironolactone (ALDACTONE) 25 MG tablet Take 1 tablet (25 mg total) by mouth daily. 09/01/20  Yes Hassell Done Mary-Margaret, FNP    Inpatient Medications: Scheduled Meds: . atorvastatin  20 mg Oral QPM  . calcium carbonate  500 mg of elemental calcium Oral Q breakfast  .  Chlorhexidine Gluconate Cloth  6 each Topical Daily  . cholecalciferol  2,000 Units Oral Daily  . citalopram  20 mg Oral Daily  . ferrous sulfate  325 mg Oral Q breakfast  . lisinopril  2.5 mg Oral Daily  . multivitamin  1 tablet Oral BID  . pantoprazole (PROTONIX) IV  40 mg Intravenous Q12H  . PHENobarbital  64.8 mg Oral BID  . phenytoin  100 mg Oral TID PC & HS   Continuous Infusions:  PRN Meds: acetaminophen **OR** acetaminophen, albuterol, camphor-menthol, ondansetron **OR** ondansetron (ZOFRAN) IV, polyvinyl alcohol  Allergies:    Allergies  Allergen Reactions  . Penicillins Rash    Has patient had a PCN reaction causing immediate rash, facial/tongue/throat swelling, SOB or lightheadedness with hypotension: ##Yes## Has patient had a PCN reaction causing severe rash involving mucus membranes or skin necrosis: No Has patient had a PCN reaction that required hospitalization No Has patient had a PCN reaction occurring within the last 10 years: No If all of the above answers are "NO", then may proceed with Cephalosporin use.   . Prednisone     "shaking and hands twitching"     Social History:   Social History   Socioeconomic History  . Marital status: Widowed    Spouse name: Not on file  . Number of children: 2  . Years of education: 1 year of college  . Highest education level: Not on file  Occupational  History  . Occupation: Retired  Tobacco Use  . Smoking status: Former Smoker    Packs/day: 2.00    Years: 15.00    Pack years: 30.00    Quit date: 01/15/1973    Years since quitting: 47.6  . Smokeless tobacco: Never Used  Vaping Use  . Vaping Use: Never used  Substance and Sexual Activity  . Alcohol use: Yes    Comment: occasionally  . Drug use: No  . Sexual activity: Not Currently  Other Topics Concern  . Not on file  Social History Narrative   Lives at home alone. Has contact with son daily and has dinner with him weekly. [place]    Social Determinants of Health   Financial Resource Strain: Not on file  Food Insecurity: Not on file  Transportation Needs: Not on file  Physical Activity: Not on file  Stress: Not on file  Social Connections: Not on file  Intimate Partner Violence: Not on file    Family History:    Family History  Problem Relation Age of Onset  . Emphysema Mother        age 75 deceased  . Stroke Mother   . Emphysema Father        deceased age 72     ROS:  Please see the history of present illness.   All other ROS reviewed and negative.     Physical Exam/Data:   Vitals:   09/13/20 2210 09/14/20 0423 09/14/20 0804 09/14/20 1200  BP: 125/69 (!) 119/59 107/73   Pulse: 63 63 60   Resp: 16 16 (!) 21   Temp: 97.8 F (36.6 C) 97.9 F (36.6 C) 97.6 F (36.4 C)   TempSrc: Oral Oral Oral   SpO2: 98% 96% 100% 100%  Weight:  82.7 kg    Height:        Intake/Output Summary (Last 24 hours) at 09/14/2020 1541 Last data filed at 09/14/2020 0956 Gross per 24 hour  Intake 480 ml  Output 1050 ml  Net -570  ml   Last 3 Weights 09/14/2020 09/13/2020 09/12/2020  Weight (lbs) 182 lb 5.1 oz 173 lb 15.1 oz 178 lb 2.1 oz  Weight (kg) 82.7 kg 78.9 kg 80.8 kg     Body mass index is 24.73 kg/m.  General:  Elderly gentleman sitting in bedside chair in no acute distress HEENT: sclera anicteric Neck: no JVD Vascular: No carotid  bruits; distal pulses 2+ bilaterally   Cardiac:  normal S1, S2; RRR; no murmurs, rubs, or gallops Lungs:  Decreased breath sounds bilaterally with dry crackles at lung bases  Abd: soft, nontender, no hepatomegaly  Ext: trace-1+ thigh edema without significant lower leg edema Musculoskeletal:  No deformities, BUE and BLE strength normal and equal Skin: warm and dry  Neuro:  CNs 2-12 intact, no focal abnormalities noted Psych:  Normal affect   EKG:  The EKG was personally reviewed and demonstrates:  Sinus rhythm with V-pacing, rate 86 bpm, significant baseline wander, no significant change from previous.  Telemetry:  Telemetry was personally reviewed and demonstrates:  AV paced rhythm  Relevant CV Studies: Echocardiogram 06/2020: 1. Left ventricular ejection fraction, by estimation, is 50 to 55%. The  left ventricle has low normal function. The left ventricle has no regional  wall motion abnormalities. There is mild concentric left ventricular  hypertrophy. Left ventricular  diastolic parameters are consistent with Grade I diastolic dysfunction  (impaired relaxation).  2. Right ventricular systolic function is mildly reduced. The right  ventricular size is normal. There is moderately elevated pulmonary artery  systolic pressure.  3. Right atrial size was mildly dilated.  4. The mitral valve is normal in structure. Mild mitral valve  regurgitation.  5. The aortic valve is tricuspid. There is mild calcification of the  aortic valve. There is mild thickening of the aortic valve. Aortic valve  regurgitation is not visualized.  6. Aortic dilatation noted. There is mild dilatation of the aortic root,  measuring 37 mm.  7. The inferior vena cava is dilated in size with >50% respiratory  variability, suggesting right atrial pressure of 8 mmHg.   Laboratory Data:  High Sensitivity Troponin:  No results for input(s): TROPONINIHS in the last 720 hours.   Chemistry Recent Labs  Lab  09/12/20 0015 09/13/20 0337 09/14/20 0214  NA 137 140 139  K 4.2 3.6 3.5  CL 102 103 102  CO2 25 26 26   GLUCOSE 181* 104* 167*  BUN 43* 42* 39*  CREATININE 0.98 0.88 1.05  CALCIUM 9.1 8.6* 8.7*  GFRNONAA >60 >60 >60  ANIONGAP 10 11 11     Recent Labs  Lab 09/12/20 0644 09/13/20 0337 09/14/20 0214  PROT 6.0* 5.7* 5.6*  ALBUMIN 3.4* 3.2* 3.0*  AST 66* 102* 83*  ALT 72* 109* 115*  ALKPHOS 130* 134* 144*  BILITOT 0.7 0.8 0.8   Hematology Recent Labs  Lab 09/12/20 0957 09/13/20 0337 09/14/20 0214  WBC 10.7* 9.6 11.8*  RBC 2.22* 2.28* 2.45*  HGB 7.2* 7.3* 8.1*  HCT 24.9* 23.8* 25.0*  MCV 112.2* 104.4* 102.0*  MCH 32.4 32.0 33.1  MCHC 28.9* 30.7 32.4  RDW 21.2* 20.7* 20.7*  PLT 267 263 263   BNP Recent Labs  Lab 09/12/20 0015 09/13/20 0337 09/14/20 0214  BNP 983.0* 866.0* 849.9*    DDimer No results for input(s): DDIMER in the last 168 hours.   Radiology/Studies:  CT Chest High Resolution  Result Date: 09/14/2020 CLINICAL DATA:  86 year old male with history of respiratory failure. Worsening shortness of breath. Evaluate for  interstitial lung disease. EXAM: CT CHEST WITHOUT CONTRAST TECHNIQUE: Multidetector CT imaging of the chest was performed following the standard protocol without intravenous contrast. High resolution imaging of the lungs, as well as inspiratory and expiratory imaging, was performed. COMPARISON:  Chest CT 06/25/2020. FINDINGS: Cardiovascular: Heart size is mildly enlarged. There is no significant pericardial fluid, thickening or pericardial calcification. There is aortic atherosclerosis, as well as atherosclerosis of the great vessels of the mediastinum and the coronary arteries, including calcified atherosclerotic plaque in the left main, left anterior descending, left circumflex and right coronary arteries. Calcifications of the aortic valve and mitral annulus. Right-sided pacemaker/AICD with lead tips terminating in the right atrium and right  ventricle. Mediastinum/Nodes: No pathologically enlarged mediastinal or hilar lymph nodes. Please note that accurate exclusion of hilar adenopathy is limited on noncontrast CT scans. Esophagus is unremarkable in appearance. No axillary lymphadenopathy. Lungs/Pleura: Moderate right and small left pleural effusions lying dependently with some associated passive subsegmental atelectasis in the lower lobes of the lungs bilaterally. High-resolution imaging is limited by considerable patient respiratory motion. With these limitations in mind, there are some patchy peripheral predominant areas of ground-glass attenuation, septal thickening, cylindrical bronchiectasis and peripheral bronchiolectasis in the lungs bilaterally, most evident throughout the mid to lower lungs. No frank honeycombing. Diffuse bronchial wall thickening with mild centrilobular and paraseptal emphysema. Inspiratory and expiratory imaging is unremarkable. No acute consolidative airspace disease. No definite suspicious appearing pulmonary nodules or masses are noted. Upper Abdomen: Aortic atherosclerosis. Musculoskeletal: There are no aggressive appearing lytic or blastic lesions noted in the visualized portions of the skeleton. IMPRESSION: 1. The appearance of the lungs is compatible with interstitial lung disease, with a spectrum of findings considered probable usual interstitial pneumonia (UIP) per current ATS guidelines. Repeat high-resolution chest CT is recommended in 12 months to assess for temporal changes in the appearance of the lung parenchyma. 2. Diffuse bronchial wall thickening with mild centrilobular and paraseptal emphysema; imaging findings suggestive of underlying COPD. 3. Aortic atherosclerosis, in addition to left main and 3 vessel coronary artery disease. 4. There are calcifications of the aortic valve and mitral annulus. Echocardiographic correlation for evaluation of potential valvular dysfunction may be warranted if clinically  indicated. 5. Mild cardiomegaly. Aortic Atherosclerosis (ICD10-I70.0) and Emphysema (ICD10-J43.9). Electronically Signed   By: Vinnie Langton M.D.   On: 09/14/2020 13:13   DG CHEST PORT 1 VIEW  Result Date: 09/13/2020 CLINICAL DATA:  Pulmonary edema EXAM: PORTABLE CHEST - 1 VIEW COMPARISON:  the previous day's study FINDINGS: Mild interstitial edema or infiltrates, improved since previous. Improved left retrocardiac aeration. Stable cardiomegaly. Aortic Atherosclerosis (ICD10-170.0). Stable left subclavian dual lead transvenous pacemaker. Blunting of lateral costophrenic angles as before.  No pneumothorax. Mild diffuse osteopenia. IMPRESSION: Interval improvement in bilateral edema/infiltrates. Electronically Signed   By: Lucrezia Europe M.D.   On: 09/13/2020 08:16   DG Chest Portable 1 View  Result Date: 09/12/2020 CLINICAL DATA:  Initial evaluation for acute shortness of breath. EXAM: PORTABLE CHEST 1 VIEW COMPARISON:  Prior radiograph from 08/20/2020. FINDINGS: Right-sided pacemaker/AICD in place. Cardiomegaly, stable. Mediastinal silhouette normal. Aortic atherosclerosis. Lungs hypoinflated. Small layering bilateral pleural effusions with associated bibasilar atelectasis. Diffuse prominence of the pulmonary vasculature and interstitial markings, favored to reflect mild pulmonary interstitial congestion/edema. Atypical pneumonitis could also have this appearance. No pneumothorax. No acute osseous finding. IMPRESSION: 1. Cardiomegaly with diffuse prominence of the pulmonary vasculature and interstitial markings, favored to reflect mild pulmonary interstitial congestion/edema. Possible atypical pneumonitis could be considered in the  correct clinical setting. 2. Superimposed small layering bilateral pleural effusions with associated bibasilar atelectasis. 3.  Aortic Atherosclerosis (ICD10-I70.0). Electronically Signed   By: Jeannine Boga M.D.   On: 09/12/2020 00:32     Assessment and Plan:   1.  Acute on chronic diastolic CHF: patient presented with profound DOE, though no complaints of SOB at rest, orthopnea, PND, LE edema, or weight gain. BNP elevated to 900. CXR and high resolution CT chest with evidence of interstitial congestion/edema. EKG with paced rhythm. He was started on IV lasix daily though UOP has been marginal and no significant change in symptoms. He does not appear significantly volume overloaded on exam. Suspect his SOB is predominately 2/2 underlying pulmonary disease which is exacerbated with recent acute on chronic anemia. He received IV lasix this evening - Will reassess need for ongoing IV diuresis in the morning.  - No need to repeat an echocardiogram  - Continue to monitor strict I&Os and daily weights - Continue to monitor electrolytes and replete as needed to maintain K >4, Mg>2  2. CAD s/p PCI/DES to RCA x2 in 2017: no recent chest pain or symptoms reminiscent of prior MI. Plavix and aspirin on hold in the setting of suspected GI bleed.  - Plan to restart aspirin once cleared to do so by GI  3. Acute on chronic anemia: Patient had recent dark tarry stools. Hgb down to 7.1 on the day of admission from baseline 11-12. Heme positive guiaic. GI consulted with plans to complete CT A/P and likely EGD later this week pending plavix wash out.  - Continue close monitoring of Hgb -transfuse to maintain >7  4. ILD: on chronic O2 at home. Suspect this is driving his SOB. Pulmonary following.  - Continue management per primary team and PCCM  For questions or updates, please contact [place] Please consult [email or web address] for contact info under    Signed, Abigail Butts, PA-C  09/14/2020 3:41 PM As above, patient seen and examined.  Briefly he is an 86 year old male with past medical history of coronary artery disease, hypertension, hyperlipidemia, prior pacemaker, COPD, interstitial lung disease, prior intracranial hemorrhage secondary to AV malformation, seizures,  prostate cancer, bladder cancer for evaluation of acute on chronic diastolic congestive heart failure.  Most recent echocardiogram October 2021 showed normal LV function, mild left ventricular hypertrophy, grade 1 diastolic dysfunction, moderate pulmonary hypertension, mild right atrial enlargement, mild mitral regurgitation.  Patient states that he has had increasing dyspnea on exertion for the past 6 weeks.  No orthopnea, PND, worsening pedal edema, chest pain or syncope.  He has had a cough productive of clear sputum.  No hemoptysis.  He denies fevers or chills.  He was admitted and cardiology asked to evaluate.  On exam he has trace to 1+ thigh edema.  He has basilar dry crackles on lung exam.  High resolution chest CT showed emphysema and probable UIP.  BUN 39 with creatinine 1.05.  BNP 849.  Hemoglobin initially 7.2.  Electrocardiogram shows sinus with ventricular pacing.  1 acute on chronic diastolic congestive heart failure-patient's dyspnea appears to be predominantly related to progressive pulmonary disease/UIP.  This is certainly exacerbated by his anemia and potential contribution from diastolic CHF though not markedly volume overloaded on exam.  Patient was given Lasix 40 mg IV today.  Will reassess tomorrow morning and likely continue low-dose diuretic.  Follow potassium and renal function.  Note his LV function is normal.  2 UIP-management per pulmonary.  3 anemia-gastroenterology evaluating.  Plan is for enteroscopy.  Follow hemoglobin and transfuse as needed.  4 coronary artery disease-plan to continue statin.  Last PCI was August 2017.  Okay to hold aspirin and Plavix for now but would like to resume aspirin 81 mg daily when okay with GI.  Kirk Ruths, MD

## 2020-09-14 NOTE — Progress Notes (Signed)
SATURATION QUALIFICATIONS: (This note is used to comply with regulatory documentation for home oxygen)  Patient Saturations on Room Air at Rest = N/A (pt 87% on 2.5L at rest)  Patient Saturations on Room Air while Ambulating = N/A (see above)  Patient Saturations on 4 Liters of oxygen while Ambulating = 92%  Please briefly explain why patient needs home oxygen: To maintain oxygen saturations > 90% at rest and with ambulation.   Wyona Almas, PT, DPT Acute Rehabilitation Services Pager [phone] Office [phone]

## 2020-09-14 NOTE — Progress Notes (Addendum)
PROGRESS NOTE    Jahem Deleeuw   [id number]  DOB: Jun 13, 1932  DOA: 09/12/2020 PCP: Chevis Pretty, FNP   Brief Narrative:  [name] 86 y.o.malewith medical history significant ofintracerebral AV malformation with a history of intracerebral bleed and seizure treated with craniotomy in 1980, ILD with chronic resp failure on 3-4 L O2, osteoarthritis, lower extremity edema, bladder cancer, CAD, COPD, Mobitz 1 arrhythmia, pacemaker placement GERD, hyperlipidemia, hypertension, macular degeneration, vitamin D deficiency who is coming to the emergencydepartmentdue to progressively worse dyspneafor the past 2 days and an episode of dark tarry looking stools. He takes a daily iron supplement, but states that usually the stools do not look as dark. He denies abdominal pain, nausea, emesis, diarrhea, constipation or BRBPR. He mentions that he has been taking Aleve nearly daily for the past year.  Subjective: He feels that his shortness of breath has not improved at all since being admitted. He has a dry cough still. No other complaints.     Assessment & Plan:   Principal Problem:   Acute on chronic diastolic congestive heart failure  Acute on chronic respiratory failure- ILD - last ECHO 10/21> EF 50-55% and grade 1 d CHF - repeating ECHO - repeat CXR on 12/26 reveals improving infiltrates and effusions - on Spironolactone, IV Lasix, Lisinopril - d/c IV Lasix and Aldactone today as he appears euvolemic- crackles in lungs are likely related to ILD (see below) - son would like cardiology and pulmonary consults today- I will be calling in consult requests  Active Problems:   Chronic respiratory failure with ILD - on 3-4 L O2 as outpatient - office visit 07/21/20> Dr Vaughan Browner ordered workup for ILD and planned for High res CT in Jan - Addendum: high resolution CT suggestive of interstitial lung disease- son would like pulm eval which I have ordered    Heme positive stool with  possible Melena- h/o GERD - Hb dropped to about 7.1 on 12/25 but has risen to 8.1 w/o transfusion - Iron levels low normal- he takes daily Iron at home - EGD in 2/20 revealed gastritis - GI consult today  Seizure disorder secondary to intracranial bleed from AV malformation (s/p craniotomy) - cont Dilantin, Phenobarbital    Macrocytic anemia - anemia panel on 12/25 revealed normal folate and B12 levels    CAD, h/o STEMI s/p PCI and 2 DES stents to RCA in 2017 -Plavix and Aspirin on hold due to being hemoccult + and c/o black stools    Functional quadriplegia  - previously ambulating with a cane- unable to ambulate now for at least 1 wk- - f/u on PT eval  H/o Prostate and bladder cancers - s/p radioactive seed implant  Pacemaker    Time spent in minutes: 35 DVT prophylaxis: SCDs Start: 09/12/20 0211  Code Status: stable Family Communication: Son, [name] Disposition Plan:  Status is: Inpatient  Remains inpatient appropriate because:Ongoing diagnostic testing needed not appropriate for outpatient work up   [employer]: The patient is from: Home              Anticipated d/c is to: TBD              Anticipated d/c date is: 2 days              Patient currently is not medically stable to d/c.  Consultants:   GI Procedures:   none Antimicrobials:  Anti-infectives (From admission, onward)   None       Objective: Vitals:  09/13/20 2210 09/14/20 0423 09/14/20 0804 09/14/20 1200  BP: 125/69 (!) 119/59 107/73   Pulse: 63 63 60   Resp: 16 16 (!) 21   Temp: 97.8 F (36.6 C) 97.9 F (36.6 C) 97.6 F (36.4 C)   TempSrc: Oral Oral Oral   SpO2: 98% 96% 100% 100%  Weight:  82.7 kg    Height:        Intake/Output Summary (Last 24 hours) at 09/14/2020 1513 Last data filed at 09/14/2020 0956 Gross per 24 hour  Intake 480 ml  Output 1050 ml  Net -570 ml   Filed Weights   09/12/20 1624 09/13/20 0547 09/14/20 0423  Weight: 80.8 kg 78.9 kg 82.7 kg     Examination: General exam: Appears comfortable  HEENT: PERRLA, oral mucosa moist, no sclera icterus or thrush Respiratory system: Coarse crackles at bases-  Respiratory effort normal. Cardiovascular system: S1 & S2 heard, RRR.   Gastrointestinal system: Abdomen soft, non-tender, nondistended. Normal bowel sounds. Central nervous system: Alert and oriented. No focal neurological deficits. Extremities: No cyanosis, clubbing or edema Skin: No rashes or ulcers Psychiatry:  Mood & affect appropriate.     Data Reviewed: I have personally reviewed following labs and imaging studies  CBC: Recent Labs  Lab 09/12/20 0015 09/12/20 0644 09/12/20 0957 09/13/20 0337 09/14/20 0214  WBC 13.2*  --  10.7* 9.6 11.8*  NEUTROABS 9.8*  --   --   --   --   HGB 8.3* 7.1* 7.2* 7.3* 8.1*  HCT 26.9*  --  24.9* 23.8* 25.0*  MCV 105.5*  --  112.2* 104.4* 102.0*  PLT 318  --  267 263 263   Basic Metabolic Panel: Recent Labs  Lab 09/12/20 0015 09/13/20 0337 09/14/20 0214  NA 137 140 139  K 4.2 3.6 3.5  CL 102 103 102  CO2 25 26 26   GLUCOSE 181* 104* 167*  BUN 43* 42* 39*  CREATININE 0.98 0.88 1.05  CALCIUM 9.1 8.6* 8.7*  MG 2.4 2.2 2.0   GFR: Estimated Creatinine Clearance: 55.4 mL/min (by C-G formula based on SCr of 1.05 mg/dL). Liver Function Tests: Recent Labs  Lab 09/12/20 0644 09/13/20 0337 09/14/20 0214  AST 66* 102* 83*  ALT 72* 109* 115*  ALKPHOS 130* 134* 144*  BILITOT 0.7 0.8 0.8  PROT 6.0* 5.7* 5.6*  ALBUMIN 3.4* 3.2* 3.0*   No results for input(s): LIPASE, AMYLASE in the last 168 hours. No results for input(s): AMMONIA in the last 168 hours. Coagulation Profile: Recent Labs  Lab 09/12/20 0015 09/13/20 0337 09/14/20 0214  INR 1.3* 1.3* 1.4*   Cardiac Enzymes: No results for input(s): CKTOTAL, CKMB, CKMBINDEX, TROPONINI in the last 168 hours. BNP (last 3 results) No results for input(s): PROBNP in the last 8760 hours. HbA1C: No results for input(s): HGBA1C  in the last 72 hours. CBG: No results for input(s): GLUCAP in the last 168 hours. Lipid Profile: No results for input(s): CHOL, HDL, LDLCALC, TRIG, CHOLHDL, LDLDIRECT in the last 72 hours. Thyroid Function Tests: No results for input(s): TSH, T4TOTAL, FREET4, T3FREE, THYROIDAB in the last 72 hours. Anemia Panel: Recent Labs    09/12/20 0645 09/12/20 0646  VITAMINB12 520  --   FOLATE 7.4  --   FERRITIN 67  --   TIBC 277  --   IRON 39*  --   RETICCTPCT  --  6.0*   Urine analysis:    Component Value Date/Time   COLORURINE YELLOW 06/01/2016 1347   APPEARANCEUR CLOUDY (  A) 06/01/2016 1347   APPEARANCEUR Clear 01/20/2016 0959   LABSPEC 1.026 06/01/2016 1347   PHURINE 7.0 06/01/2016 [place] 06/01/2016 [address] 06/01/2016 [place] 06/01/2016 1347   BILIRUBINUR Negative 01/20/2016 0959   KETONESUR NEGATIVE 06/01/2016 1347   PROTEINUR NEGATIVE 06/01/2016 1347   UROBILINOGEN negative 09/10/2014 1233   UROBILINOGEN 0.2 09/23/2009 2141   NITRITE NEGATIVE 06/01/2016 1347   LEUKOCYTESUR TRACE (A) 06/01/2016 1347   LEUKOCYTESUR Negative 01/20/2016 0959   Sepsis Labs: @LABRCNTIP (procalcitonin:4,lacticidven:4) ) Recent Results (from the past 240 hour(s))  Resp Panel by RT-PCR (Flu A&B, Covid) Nasopharyngeal Swab     Status: None   Collection Time: 09/12/20 12:14 AM   Specimen: Nasopharyngeal Swab; Nasopharyngeal(NP) swabs in vial transport medium  Result Value Ref Range Status   SARS Coronavirus 2 by RT PCR NEGATIVE NEGATIVE Final    Comment: (NOTE) SARS-CoV-2 target nucleic acids are NOT DETECTED.  The SARS-CoV-2 RNA is generally detectable in upper respiratory specimens during the acute phase of infection. The lowest concentration of SARS-CoV-2 viral copies this assay can detect is 138 copies/mL. A negative result does not preclude SARS-Cov-2 infection and should not be used as the sole basis for treatment or other patient  management decisions. A negative result may occur with  improper specimen collection/handling, submission of specimen other than nasopharyngeal swab, presence of viral mutation(s) within the areas targeted by this assay, and inadequate number of viral copies(<138 copies/mL). A negative result must be combined with clinical observations, patient history, and epidemiological information. The expected result is Negative.  Fact Sheet for Patients:  EntrepreneurPulse.com.au  Fact Sheet for Healthcare Providers:  IncredibleEmployment.be  This test is no t yet approved or cleared by the [place] FDA and  has been authorized for detection and/or diagnosis of SARS-CoV-2 by FDA under an Emergency Use Authorization (EUA). This EUA will remain  in effect (meaning this test can be used) for the duration of the COVID-19 declaration under Section 564(b)(1) of the Act, 21 U.S.C.section 360bbb-3(b)(1), unless the authorization is terminated  or revoked sooner.       Influenza A by PCR NEGATIVE NEGATIVE Final   Influenza B by PCR NEGATIVE NEGATIVE Final    Comment: (NOTE) The Xpert Xpress SARS-CoV-2/FLU/RSV plus assay is intended as an aid in the diagnosis of influenza from Nasopharyngeal swab specimens and should not be used as a sole basis for treatment. Nasal washings and aspirates are unacceptable for Xpert Xpress SARS-CoV-2/FLU/RSV testing.  Fact Sheet for Patients: EntrepreneurPulse.com.au  Fact Sheet for Healthcare Providers: IncredibleEmployment.be  This test is not yet approved or cleared by the [place] FDA and has been authorized for detection and/or diagnosis of SARS-CoV-2 by FDA under an Emergency Use Authorization (EUA). This EUA will remain in effect (meaning this test can be used) for the duration of the COVID-19 declaration under Section 564(b)(1) of the Act, 21 U.S.C. section 360bbb-3(b)(1),  unless the authorization is terminated or revoked.  Performed at [facility], [address], [place], [place] [address]   MRSA PCR Screening     Status: None   Collection Time: 09/12/20  4:15 PM   Specimen: Nasal Mucosa; Nasopharyngeal  Result Value Ref Range Status   MRSA by PCR NEGATIVE NEGATIVE Final    Comment:        The GeneXpert MRSA Assay (FDA approved for NASAL specimens only), is one component of a comprehensive MRSA colonization surveillance program. It is not intended to diagnose  MRSA infection nor to guide or monitor treatment for MRSA infections. Performed at [facility], [address] [place], [place] [address]          Radiology Studies: CT Chest High Resolution  Result Date: 09/14/2020 CLINICAL DATA:  86 year old male with history of respiratory failure. Worsening shortness of breath. Evaluate for interstitial lung disease. EXAM: CT CHEST WITHOUT CONTRAST TECHNIQUE: Multidetector CT imaging of the chest was performed following the standard protocol without intravenous contrast. High resolution imaging of the lungs, as well as inspiratory and expiratory imaging, was performed. COMPARISON:  Chest CT 06/25/2020. FINDINGS: Cardiovascular: Heart size is mildly enlarged. There is no significant pericardial fluid, thickening or pericardial calcification. There is aortic atherosclerosis, as well as atherosclerosis of the great vessels of the mediastinum and the coronary arteries, including calcified atherosclerotic plaque in the left main, left anterior descending, left circumflex and right coronary arteries. Calcifications of the aortic valve and mitral annulus. Right-sided pacemaker/AICD with lead tips terminating in the right atrium and right ventricle. Mediastinum/Nodes: No pathologically enlarged mediastinal or hilar lymph nodes. Please note that accurate exclusion of hilar adenopathy is limited on noncontrast CT scans. Esophagus is unremarkable in appearance. No  axillary lymphadenopathy. Lungs/Pleura: Moderate right and small left pleural effusions lying dependently with some associated passive subsegmental atelectasis in the lower lobes of the lungs bilaterally. High-resolution imaging is limited by considerable patient respiratory motion. With these limitations in mind, there are some patchy peripheral predominant areas of ground-glass attenuation, septal thickening, cylindrical bronchiectasis and peripheral bronchiolectasis in the lungs bilaterally, most evident throughout the mid to lower lungs. No frank honeycombing. Diffuse bronchial wall thickening with mild centrilobular and paraseptal emphysema. Inspiratory and expiratory imaging is unremarkable. No acute consolidative airspace disease. No definite suspicious appearing pulmonary nodules or masses are noted. Upper Abdomen: Aortic atherosclerosis. Musculoskeletal: There are no aggressive appearing lytic or blastic lesions noted in the visualized portions of the skeleton. IMPRESSION: 1. The appearance of the lungs is compatible with interstitial lung disease, with a spectrum of findings considered probable usual interstitial pneumonia (UIP) per current ATS guidelines. Repeat high-resolution chest CT is recommended in 12 months to assess for temporal changes in the appearance of the lung parenchyma. 2. Diffuse bronchial wall thickening with mild centrilobular and paraseptal emphysema; imaging findings suggestive of underlying COPD. 3. Aortic atherosclerosis, in addition to left main and 3 vessel coronary artery disease. 4. There are calcifications of the aortic valve and mitral annulus. Echocardiographic correlation for evaluation of potential valvular dysfunction may be warranted if clinically indicated. 5. Mild cardiomegaly. Aortic Atherosclerosis (ICD10-I70.0) and Emphysema (ICD10-J43.9). Electronically Signed   By: Trudie Reed M.D.   On: 09/14/2020 13:13   DG CHEST PORT 1 VIEW  Result Date:  09/13/2020 CLINICAL DATA:  Pulmonary edema EXAM: PORTABLE CHEST - 1 VIEW COMPARISON:  the previous day's study FINDINGS: Mild interstitial edema or infiltrates, improved since previous. Improved left retrocardiac aeration. Stable cardiomegaly. Aortic Atherosclerosis (ICD10-170.0). Stable left subclavian dual lead transvenous pacemaker. Blunting of lateral costophrenic angles as before.  No pneumothorax. Mild diffuse osteopenia. IMPRESSION: Interval improvement in bilateral edema/infiltrates. Electronically Signed   By: Corlis Leak M.D.   On: 09/13/2020 08:16      Scheduled Meds: . atorvastatin  20 mg Oral QPM  . calcium carbonate  500 mg of elemental calcium Oral Q breakfast  . Chlorhexidine Gluconate Cloth  6 each Topical Daily  . cholecalciferol  2,000 Units Oral Daily  . citalopram  20 mg Oral Daily  .  ferrous sulfate  325 mg Oral Q breakfast  . lisinopril  2.5 mg Oral Daily  . multivitamin  1 tablet Oral BID  . pantoprazole (PROTONIX) IV  40 mg Intravenous Q12H  . PHENobarbital  64.8 mg Oral BID  . phenytoin  100 mg Oral TID PC & HS   Continuous Infusions:   LOS: 1 day      Debbe Odea, MD [facility] Pager: [email or web address] 09/14/2020, 3:13 PM

## 2020-09-14 NOTE — Progress Notes (Signed)
Physical Therapy Treatment Patient Details Name: Joshua [name] MRN: [id number] DOB: 02-Jul-1932 Today's Date: 09/14/2020    History of Present Illness Joshua [name] is a 86 y.o. male with medical history significant of intracerebral AV malformation with a history of intracerebral bleed and seizure treated with craniotomy in 1980, osteoarthritis, lower extremity edema, bladder cancer, CAD, COPD, Mobitz 1 arrhythmia, pacemaker placement GERD, hyperlipidemia, hypertension, macular degeneration, vitamin D deficiency who is coming to the emergency department due to progressively worse dyspnea for the past 2 days and an episode of dark tarry looking stools yesterday.  He takes a daily iron supplement, but states that usually the stools do not look as dark.  He denies abdominal pain, nausea, emesis, diarrhea, constipation or BRBPR.  He mentions that he has been taking Aleve nearly daily for the past year.  No fever, chills, but positive generalized weakness.  No wheezing or hemoptysis.  No chest pain, palpitations, diaphoresis, dysuria, frequency or hematuria.  No polyuria, polydipsia, polyphagia or blurred vision.    PT Comments    Pt progressing slowly towards his physical therapy goals. Requiring moderate assist for bed mobility and stand pivot transfers to recliner using a walker. Resting SpO2 on 2.5L 87%, bumped up to 3L O2 for mobility; subsequent desaturation to 82% sitting edge of bed and increased to 4L O2 where O2 improved to 92%. Pt presents with gross weakness, decreased coordination, and poor balance. Continue to recommend SNF for ongoing Physical Therapy.       Follow Up Recommendations  SNF     Equipment Recommendations  None recommended by PT    Recommendations for Other Services       Precautions / Restrictions Precautions Precautions: Fall;Other (comment) Precaution Comments: watch O2 Restrictions Weight Bearing Restrictions: No    Mobility  Bed Mobility Overal bed mobility:  Needs Assistance Bed Mobility: Supine to Sit     Supine to sit: Mod assist     General bed mobility comments: Assist for RLE off edge of bed, trunk to upright, use of bed pad to scoot hips forward. Slow and effortful  Transfers Overall transfer level: Needs assistance Equipment used: Rolling walker (2 wheeled) Transfers: Sit to/from [employer] Sit to Stand: Mod assist Stand pivot transfers: Mod assist       General transfer comment: ModA to power up to stand from elevated bed height, cues for posterior foot placement. Pivoting over to chair towards left, cues for sequencing/direction, decreased coordination and bilateral knee instability  Ambulation/Gait                 Stairs             Wheelchair Mobility    Modified Rankin (Stroke Patients Only)       Balance Overall balance assessment: Needs assistance Sitting-balance support: Feet supported;No upper extremity supported Sitting balance-Leahy Scale: Fair     Standing balance support: Bilateral upper extremity supported;During functional activity Standing balance-Leahy Scale: Poor Standing balance comment: using RW                            Cognition Arousal/Alertness: Awake/alert Behavior During Therapy: WFL for tasks assessed/performed Overall Cognitive Status: Impaired/Different from baseline Area of Impairment: Safety/judgement;Problem solving                         Safety/Judgement: Decreased awareness of safety;Decreased awareness of deficits   Problem Solving: Requires verbal cues General  Comments: Pt A&Ox3 with increased time, decreased awareness of deficits, cues provided for initiation/problem solving      Exercises General Exercises - Lower Extremity Long Arc Quad: Both;5 reps;Seated    General Comments        Pertinent Vitals/Pain Pain Assessment: No/denies pain    Home Living                      Prior Function             PT Goals (current goals can now be found in the care plan section) Acute Rehab PT Goals Patient Stated Goal: return home, get stronger Potential to Achieve Goals: Fair    Frequency    Min 3X/week      PT Plan Current plan remains appropriate    Co-evaluation              AM-PAC PT "6 Clicks" Mobility   Outcome Measure  Help needed turning from your back to your side while in a flat bed without using bedrails?: A Little Help needed moving from lying on your back to sitting on the side of a flat bed without using bedrails?: A Lot Help needed moving to and from a bed to a chair (including a wheelchair)?: A Lot Help needed standing up from a chair using your arms (e.g., wheelchair or bedside chair)?: A Lot Help needed to walk in hospital room?: A Lot Help needed climbing 3-5 steps with a railing? : Total 6 Click Score: 12    End of Session Equipment Utilized During Treatment: Gait belt;Oxygen Activity Tolerance: Patient tolerated treatment well;Patient limited by fatigue Patient left: in chair;with call bell/phone within reach;with family/visitor present Nurse Communication: Mobility status PT Visit Diagnosis: Unsteadiness on feet (R26.81);Other abnormalities of gait and mobility (R26.89);Muscle weakness (generalized) (M62.81)     Time: [id number] PT Time Calculation (min) (ACUTE ONLY): 37 min  Charges:  $Therapeutic Activity: 8-22 mins                     Wyona Almas, PT, DPT Acute Rehabilitation Services Pager [phone] Office [phone]    Deno Etienne 09/14/2020, 2:24 PM

## 2020-09-14 NOTE — Consult Note (Addendum)
[facility] Consult: 11:19 AM 09/14/2020  LOS: 1 day    Referring Provider: Dr Wynelle Cleveland  Primary Care Physician:  Chevis Pretty, FNP Primary Gastroenterologist:  Dr Carlean Purl     Reason for Consultation:  Anemia.  FOBT +.     HPI: Joshua [name] is a 86 y.o. male.  PMH Emphysema, pulmonary fibrosis w chronic respiratory failure on chronic home oxygen.  Right sided heart failure due to lung dz.  Chronic Plavix, history MI, CAD, drug-eluting stent 2017.  Second-degree AV block, s/p pacemaker.  Prostate cancer, bladder cancer.  S/p prostate radiation seed implants 2013.  Transurethral resection of bladder tumor 2017, cystoscopy/biopsy of recurrent bladder tumor 10/2016 (path with low-grade papillary urothelial carcinoma involving von Bruns nest no involvement of muscularis propria).  Appendectomy 2011.  Cholelithiasis.  Hearing loss.  Macular degeneration.  5-day hospitalization in early October 2021 with hypoxic respiratory failure, CAP, acute on chronic diastolic CHF.  Hgb 9.6 then.     Remote hx adenomatous colon polyps. Hx GERD.   Past GI evaluations for IDA, FOBT positive stool in setting of Plavix/ASA: 10/2018 EGD: Gastritis, biopsied, nodular duodenal bulb mucosa, biopsied.  Otherwise normal.  Duodenal pathology: inflamed, nondysplastic gastric mucosa which in the duodenum raises possibility of gastric heterotopia.  Gastric pathology: chronic inactive gastritis.  No H. pylori. 10/2018 colonoscopy: Exam into the terminal ileum was normal.  No plans for repeat colonoscopy due to patient age.  Patient's been having issues with dyspnea, wheezing, low energy, weakness.  Was treated with a prednisone taper and Levaquin around the second week of December.  Received prescription for Z-Pak and another 10 days of  prednisone 20 mg/daily on 12/22.  Vaccinated for Covid x3. Lives at home with 24/7 care.  Presented to [facility] ED on Christmas because of profound weakness and dyspnea with simple tasks such as transferring from bed to wheelchair.. Stools are dark, unchanged due to chronic oral iron.  No blood per rectum.  No nausea or vomiting.  No abdominal pain.  Iron plus daily Os-Cal has caused constipation and he has about 2 bowel movements a week.  Takes OTC laxatives daily.  Good appetite, weight stable.   COVID-19, influenza testing negative. CXR with CM, mild interstitial congestion/edema, possible atypical pneumonitis. Small bil effusions/BB ATX Hgb 7.2 >> 8.1.  MCV 112.  WBCs 13.2.  Platelets in the mid 200s.  INR 1.4 FOBT positive T bili normal, alkaline phosphatase 130 >> 144 ( 157 in late October).  AST/ALT 66/72 >> 102/109 >> 83/115  No transfusions this admission, but + for remote transfusion before 2017.   Has not had any dedicated GI imaging for years.  CT abdomen pelvis of 09/2009 revealed acute appendicitis, cholelithiasis.  04/2012 MRI abdomen revealed stable (from 2012), 11 mm lesion in anterior segment of right hepatic lobe favoring benign hemangioma.  10/2015 abdominal ultrasound for eval elevated LFTs revealed cholelithiasis without complication, 6 mm CBD, unremarkable liver.  Past Medical History:  Diagnosis Date  . Arteriovenous malformation    caused intracerebrial bleed w seizure -- post craniotomy  1980  . Arthritis   . Bilateral lower extremity edema   . Bladder tumor   . CAD (coronary artery disease) cardiologist--  dr Daneen Schick   a. 04/27/2016 PCI with DES to RCA with 50% ostial to 60% segmental mid to distal left main, and 90% thrombus filled ostial to proximal circumflex, with distal right coronary filled by collaterals from left-to-right  . COPD with emphysema ([place])   . Dyspnea on exertion   . ED (erectile dysfunction)   . First degree AV block   . GERD  (gastroesophageal reflux disease)   . History of adenomatous polyp of colon    09-22-2008  . History of bladder cancer    s/p  resection bladder tumor 04-19-2016  non-invasive low-grade urothelial carcinoma  . History of prostate cancer urologist-  dr Jeffie Pollock-- last PSA 0.02 (summer 2017)   dx 02/ 2013-- Stage T2a, Gleason 7, PSA 1.58--  s/p  radioactive prostate seed implants 01-19-2012  . History of ST elevation myocardial infarction (STEMI)    04-27-2016  . Hyperlipidemia   . Hypertension   . Macular degeneration   . Presence of permanent cardiac pacemaker   . S/P drug eluting coronary stent placement    04-27-2016   . Second degree Mobitz I AV block    occasional w/ first degree heart block  per cardiologist note by dr Daneen Schick  . Seizures ([place]) per pt son -- no seizure's since 1980   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  arteriorvenous cerebral malformation s/p  craniotomy 1980  . Vitamin D deficiency     Past Surgical History:  Procedure Laterality Date  . APPENDECTOMY  2011  . BLEPHAROPLASTY Bilateral revision 02-17-2016  . CARDIAC CATHETERIZATION N/A 04/27/2016   Procedure: Left Heart Cath and Coronary Angiography;  Surgeon: Belva Crome, MD;  Location: [facility];  Service: Cardiovascular;  Laterality: N/A;  acute inferior wall STEMI ;  ostCx to midCx 90%,  ost1st Mrg to 1st Mrg 50%,  mid to distal RCA 100%,  pRCA 80%,  LVEF 45-50% w/ inferobasal akinesis  . CARDIAC CATHETERIZATION N/A 04/27/2016   Procedure: Temporary Pacemaker;  Surgeon: Belva Crome, MD;  Location: [facility];  Service: Cardiovascular;  Laterality: N/A;  mobitz 2  second degree HB w/ heart rate 29bpm  . CARDIAC CATHETERIZATION N/A 04/27/2016   Procedure: Coronary Stent Intervention;  Surgeon: Belva Crome, MD;  Location: [facility];  Service: Cardiovascular;  Laterality: N/A; DES x1 to  Proximal RCA;  DES x1 to Mid RCA  . CATARACT EXTRACTION W/ INTRAOCULAR LENS  IMPLANT,  BILATERAL  2016  approx.  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 CYSTOSCOPY  01/19/2012   Procedure: CYSTOSCOPY;  Surgeon: Malka So, MD;  [facility];  Service: Urology;;  no seeds found in bladder  . CYSTOSCOPY W/ RETROGRADES Bilateral 04/19/2016   Procedure: CYSTOSCOPY WITH BILATERAL RETROGRADE PYELOGRAM TRANSURETHRAL RESECTION OF BLADDER TUMOR ;  Surgeon: [name], MD;  Location: [facility];  Service: Urology;  Laterality: Bilateral;  . CYSTOSCOPY WITH BIOPSY N/A 11/10/2016   Procedure: CYSTOSCOPY WITH BIOPSY AND FULGURATION;  Surgeon: [name], MD;  Location[facility];  Service: Urology;  Laterality: N/A;  . INSERT / REPLACE / REMOVE PACEMAKER  06/15/2017  . LEAD EXTRACTION N/A 01/22/2018   Procedure: LEAD EXTRACTION;  Surgeon: Evans Lance, MD;  Location: [facility];  Service: Cardiovascular;  Laterality: N/A;  . PACEMAKER IMPLANT N/A 06/15/2017   Procedure: Pacemaker Implant;  Surgeon: [name], MD;  Location: [facility];  Service: Cardiovascular;  Laterality: N/A;  . PACEMAKER IMPLANT N/A 01/25/2018   Procedure: PACEMAKER IMPLANT;  Surgeon: Evans Lance, MD;  Location: [facility];  Service: Cardiovascular;  Laterality: N/A;  . PPM GENERATOR REMOVAL N/A 01/22/2018   Procedure: PPM GENERATOR REMOVAL;  Surgeon: Evans Lance, MD;  Location: [facility];  Service: Cardiovascular;  Laterality: N/A;  . RADIOACTIVE SEED IMPLANT  01/19/2012   Procedure: RADIOACTIVE SEED IMPLANT;  Surgeon: Malka So, MD;  Location: [facility];  Service: Urology;  Laterality: N/A;  68 seeds implanted  . TRANSTHORACIC ECHOCARDIOGRAM  06/02/2016   ef 60-65%/  trivial MR/  mild TR    Prior to Admission medications   Medication Sig Start Date End Date Taking? Authorizing Provider  albuterol (VENTOLIN HFA) 108 (90 Base) MCG/ACT inhaler Inhale 1-2 puffs into the lungs  every 6 (six) hours as needed for wheezing or shortness of breath. 08/20/20  Yes Martyn Ehrich, NP  aspirin EC 81 MG tablet Take 81 mg by mouth daily. Swallow whole.   Yes [provider]  atorvastatin (LIPITOR) 20 MG tablet Take 1 tablet (20 mg total) by mouth daily. 07/01/20  Yes Martin, Mary-Margaret, FNP  azithromycin (ZITHROMAX) 250 MG tablet Take 1 tablet (250 mg total) by mouth as directed. 09/09/20  Yes Mannam, Praveen, MD  calcium carbonate (OSCAL) 1500 (600 Ca) MG TABS tablet Take 600 mg of elemental calcium by mouth daily with breakfast.   Yes [provider]  Cholecalciferol (VITAMIN D3) 50 MCG (2000 UT) capsule Take 2,000 Units by mouth daily.    Yes [provider]  citalopram (CELEXA) 20 MG tablet TAKE 1 TABLET DAILY 08/31/20  Yes Hassell Done, Mary-Margaret, FNP  clopidogrel (PLAVIX) 75 MG tablet Take 1 tablet (75 mg total) by mouth daily. 08/28/20  Yes Martin, Mary-Margaret, FNP  famotidine (PEPCID) 20 MG tablet Take 20 mg by mouth daily.    Yes [provider]  ferrous sulfate 325 (65 FE) MG tablet Take 1 tablet (325 mg total) by mouth daily with breakfast. 10/31/18  Yes Gatha Mayer, MD  furosemide (LASIX) 40 MG tablet Take 0.5 tablets (20 mg total) by mouth daily. 06/26/20 07/26/20 Yes Sheikh, Omair Latif, DO  MAGNESIUM PO Take 1 tablet by mouth at bedtime.   Yes [provider]  Multiple Vitamins-Minerals (PRESERVISION AREDS 2) CAPS Take 1 capsule by mouth 2 (two) times daily.    Yes [provider]  PHENobarbital (LUMINAL) 64.8 MG tablet Take 1 tablet (64.8 mg total) by mouth 2 (two) times daily. 07/01/20  Yes Hassell Done, Mary-Margaret, FNP  phenytoin (DILANTIN) 100 MG ER capsule Take 1 capsule (100 mg total) by mouth in the morning, at noon, in the evening, and at bedtime. 09/09/20  Yes Hassell Done, Mary-Margaret, FNP  polyvinyl alcohol (LIQUIFILM TEARS) 1.4 % ophthalmic solution Place 2 drops into both eyes daily as needed for dry  eyes.    Yes [provider]  predniSONE (DELTASONE) 20 MG tablet Take 1 tablet (20 mg total) by mouth daily with breakfast. 09/09/20  Yes Martyn Ehrich, NP  spironolactone (ALDACTONE) 25 MG tablet Take 1 tablet (25 mg total) by mouth daily. 09/01/20  Yes Martin, Mary-Margaret, FNP    Scheduled Meds: . atorvastatin  20 mg Oral QPM  . calcium  carbonate  500 mg of elemental calcium Oral Q breakfast  . Chlorhexidine Gluconate Cloth  6 each Topical Daily  . cholecalciferol  2,000 Units Oral Daily  . citalopram  20 mg Oral Daily  . ferrous sulfate  325 mg Oral Q breakfast  . lisinopril  2.5 mg Oral Daily  . multivitamin  1 tablet Oral BID  . pantoprazole (PROTONIX) IV  40 mg Intravenous Q12H  . PHENobarbital  64.8 mg Oral BID  . phenytoin  100 mg Oral TID PC & HS  . spironolactone  25 mg Oral Daily   Infusions:  PRN Meds: acetaminophen **OR** acetaminophen, albuterol, ondansetron **OR** ondansetron (ZOFRAN) IV, polyvinyl alcohol   Allergies as of 09/11/2020 - Review Complete 09/09/2020  Allergen Reaction Noted  . Penicillins Rash 08/22/2008  . Prednisone  09/09/2020    Family History  Problem Relation Age of Onset  . Emphysema Mother        age 6 deceased  . Stroke Mother   . Emphysema Father        deceased age 57    Social History   Socioeconomic History  . Marital status: Widowed    Spouse name: Not on file  . Number of children: 2  . Years of education: 1 year of college  . Highest education level: Not on file  Occupational History  . Occupation: Retired  Tobacco Use  . Smoking status: Former Smoker    Packs/day: 2.00    Years: 15.00    Pack years: 30.00    Quit date: 01/15/1973    Years since quitting: 47.6  . Smokeless tobacco: Never Used  Vaping Use  . Vaping Use: Never used  Substance and Sexual Activity  . Alcohol use: Yes    Comment: occasionally  . Drug use: No  . Sexual activity: Not Currently  Other Topics Concern  . Not on file   Social History Narrative   Lives at home alone. Has contact with son daily and has dinner with him weekly. [place]    Social Determinants of Health   Financial Resource Strain: Not on file  Food Insecurity: Not on file  Transportation Needs: Not on file  Physical Activity: Not on file  Stress: Not on file  Social Connections: Not on file  Intimate Partner Violence: Not on file    REVIEW OF SYSTEMS: Constitutional: Weakness, fatigue ENT:  No nose bleeds Pulm: Dyspnea on exertion, not at rest. CV:  No palpitations, no LE edema.  No angina GU:  No hematuria, no frequency GI: See HPI Heme: Denies excessive or unusual bleeding/bruising. Transfusions: See HPI Neuro: Positive restless leg symptoms.  No headaches, no peripheral tingling or numbness no syncope, no seizures Derm:  No itching, no rash or sores.  Endocrine:  No sweats or chills.  No polyuria or dysuria Immunization: Vaccinated x3 for COVID-19 Travel:  None beyond local counties in last few months.    PHYSICAL EXAM: Vital signs in last 24 hours: Vitals:   09/14/20 0423 09/14/20 0804  BP: (!) 119/59 107/73  Pulse: 63 60  Resp: 16 (!) 21  Temp: 97.9 F (36.6 C) 97.6 F (36.4 C)  SpO2: 96% 100%   Wt Readings from Last 3 Encounters:  09/14/20 82.7 kg  08/20/20 77.1 kg  07/21/20 79.3 kg    General: Pleasant, somewhat frail, comfortable, alert Head: No facial asymmetry or swelling.  No signs of head trauma Eyes: No conjunctival pallor.  No scleral icterus Ears: Slightly  HOH Nose: No congestion or discharge Mouth: Moist, pink, clear oral mucosa.  Tongue midline.  Good dentition. Neck: No JVD, masses, thyromegaly Lungs: Dry crackles bilaterally, particularly in the lower lung fields. Heart: RRR w soft murmur.  S1-S2 present. Abdomen: Soft without tenderness or distention.  No HSM, masses, bruits, hernias.  Bowel sounds active..   Rectal: Deferred Musc/Skeltl: No joint redness, swelling  or gross deformity. Extremities: No CCE. Neurologic: Alert.  Oriented x3.  Speech a bit slow but precise.  Moves all 4 limbs without tremor, strength not tested Skin: No rash, no sores, no suspicious lesions, no telangiectasia. Tattoos: None observed Nodes: No cervical adenopathy Psych: Calm, cooperative, pleasant.  Intake/Output from previous day: 12/26 0701 - 12/27 0700 In: 710 [P.O.:710] Out: 450 [Urine:450] Intake/Output this shift: Total I/O In: 120 [P.O.:120] Out: 600 [Urine:600]  LAB RESULTS: Recent Labs    09/12/20 0957 09/13/20 0337 09/14/20 0214  WBC 10.7* 9.6 11.8*  HGB 7.2* 7.3* 8.1*  HCT 24.9* 23.8* 25.0*  PLT 267 263 263   BMET Lab Results  Component Value Date   NA 139 09/14/2020   NA 140 09/13/2020   NA 137 09/12/2020   K 3.5 09/14/2020   K 3.6 09/13/2020   K 4.2 09/12/2020   CL 102 09/14/2020   CL 103 09/13/2020   CL 102 09/12/2020   CO2 26 09/14/2020   CO2 26 09/13/2020   CO2 25 09/12/2020   GLUCOSE 167 (H) 09/14/2020   GLUCOSE 104 (H) 09/13/2020   GLUCOSE 181 (H) 09/12/2020   BUN 39 (H) 09/14/2020   BUN 42 (H) 09/13/2020   BUN 43 (H) 09/12/2020   CREATININE 1.05 09/14/2020   CREATININE 0.88 09/13/2020   CREATININE 0.98 09/12/2020   CALCIUM 8.7 (L) 09/14/2020   CALCIUM 8.6 (L) 09/13/2020   CALCIUM 9.1 09/12/2020   LFT Recent Labs    09/12/20 0644 09/13/20 0337 09/14/20 0214  PROT 6.0* 5.7* 5.6*  ALBUMIN 3.4* 3.2* 3.0*  AST 66* 102* 83*  ALT 72* 109* 115*  ALKPHOS 130* 134* 144*  BILITOT 0.7 0.8 0.8  BILIDIR 0.2  --   --   IBILI 0.5  --   --    PT/INR Lab Results  Component Value Date   INR 1.4 (H) 09/14/2020   INR 1.3 (H) 09/13/2020   INR 1.3 (H) 09/12/2020    RADIOLOGY STUDIES: DG CHEST PORT 1 VIEW  Result Date: 09/13/2020 CLINICAL DATA:  Pulmonary edema EXAM: PORTABLE CHEST - 1 VIEW COMPARISON:  the previous day's study FINDINGS: Mild interstitial edema or infiltrates, improved since previous. Improved left  retrocardiac aeration. Stable cardiomegaly. Aortic Atherosclerosis (ICD10-170.0). Stable left subclavian dual lead transvenous pacemaker. Blunting of lateral costophrenic angles as before.  No pneumothorax. Mild diffuse osteopenia. IMPRESSION: Interval improvement in bilateral edema/infiltrates. Electronically Signed   By: Lucrezia Europe M.D.   On: 09/13/2020 08:16     IMPRESSION:   *    Macrocytic anemia. Anemia studies revealed low iron, low iron sat.  Ferritin 67.  Normal TIBC.  Normal folate, normal B12.  *     FOBT positive stool. EGD, colonoscopy 10/2018 for IDA, FOBT +: H. 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   Takes Pepcid 20 mg daily at home.  *      Elevated LFTs, particularly the alkaline phosphatase in pt w hx bladder and prostate cancer.    *    Chronic Plavix.  DES placement 2017. Last dose 12/24.    *  Right-sided heart failure consequent of pulmonary hypertension/ILD.  *     Hx prostate cancer treated with radiation seed implants. Hx bladder cancer, resected 2017.  Recurrence with cystoscopy, biopsy 10/2016.   Followed by urologist, Dr. Jeffie Pollock but has not had any recent imaging studies.  *   Seizure disorder, sequela of intracranial bleed from AVM requiring craniotomy 1980.  On phenytoin and phenobarbital.      PLAN:     *   Per Dr Lyndel Safe. ? EGD vs enteroscopy?  If pursued, will need to be postponed to allow for 5 days Plavix washout, but sets a tentative day of 12/29   Azucena Freed  09/14/2020, 11:19 AM Phone [phone]    Attending physician's note   I have taken an interval history, reviewed the chart and examined the patient. I agree with the Advanced Practitioner's note, impression and recommendations.   86yr old with advanced ILD/COPD with chronic resp failure on home O2, cor pulmonale(PAH), dCHF, H/O prostate/bladder CA, CAD s/p DES on plavix with symptomatic IDA and FOBT + dark stools.  However, he is on  chronic iron supplementation.  Hb 10 (a month ago) to 7 (this adm).  No active bleeding.  Also with abn LFTs likely d/t R Heart failure.  Neg EGD/colon 10/2018  Plan: -Trend CBC. -Proceed with CT Abdo/pelvis with p.o. and IV contrast. -Enteroscopy after Plavix washout.  Tentatively 12/29. -If neg, VCE.  Capsule can be placed at the time of enteroscopy. -Discussed in detail with the patient's son [name] who agrees with the plan. -D/W Dr Wynelle Cleveland.   Carmell Austria, MD [facility] [phone]

## 2020-09-14 NOTE — Progress Notes (Signed)
PROGRESS NOTE    Deaunta Terrasi   [id number]  DOB: 1932/08/23  DOA: 09/12/2020 PCP: Chevis Pretty, FNP   Brief Narrative:  [name] 86 y.o.malewith medical history significant ofintracerebral AV malformation with a history of intracerebral bleed and seizure treated with craniotomy in 1980, ILD with chronic resp failure on 3-4 L O2, osteoarthritis, lower extremity edema, bladder cancer, CAD, COPD, Mobitz 1 arrhythmia, pacemaker placement GERD, hyperlipidemia, hypertension, macular degeneration, vitamin D deficiency who is coming to the emergencydepartmentdue to progressively worse dyspneafor the past 2 days and an episode of dark tarry looking stools. He takes a daily iron supplement, but states that usually the stools do not look as dark. He denies abdominal pain, nausea, emesis, diarrhea, constipation or BRBPR. He mentions that he has been taking Aleve nearly daily for the past year.   Subjective: He feels that his shortness of breath has not improved at all since being admitted. He has a dry cough still. No other complaints.     Assessment & Plan:   Principal Problem:   Acute on chronic diastolic congestive heart failure  Acute on chronic respiratory failure - last ECHO 10/21> EF 50-55% and grade 1 d CHF - repeating ECHO - repeat CXR on 12/26 reveals improving infiltrates and effusions - on Spironolactone, IV Lasix, Lisinopril - d/c IV Lasix and Aldactone today as he appears euvolemic - at this point, would like to go ahead and obtain a high resolution CT to see the extent of his ILD- (see below)  Active Problems:   Chronic respiratory failure with ILD - on 3-4 L O2 as outpatient - office visit 07/21/20> Dr Vaughan Browner ordered workup for ILD and planned for High res CT in Jan    Heme positive stool with possible Melena- h/o GERD - Hb dropped to about 7.1 on 12/25 but has risen to 8.1 w/o transfusion - Iron levels low normal- he takes daily Iron at home - EGD in  2/20 revealed gastritis - GI consult today  Seizure disorder secondary to intracranial bleed from AV malformation (s/p craniotomy) - cont Dilantin, Phenobarbital    Macrocytic anemia - anemia panel on 12/25 revealed normal folate and B12 levels    CAD, h/o STEMI s/p PCI and 2 DES stents to RCA in 2017 -Plavix and Aspirin on hold due to being hemoccult + and c/o black stools    Functional quadriplegia  - previously ambulating with a cane- unable to ambulate now for at least 1 wk- - f/u on PT eval  H/o Prostate and bladder cancers - s/p radioactive seed implant  Pacemaker    Time spent in minutes: 35 DVT prophylaxis: SCDs Start: 09/12/20 0211  Code Status: stable Family Communication:  Disposition Plan:  Status is: Inpatient  Remains inpatient appropriate because:Ongoing diagnostic testing needed not appropriate for outpatient work up   Dispo: The patient is from: Home              Anticipated d/c is to: TBD              Anticipated d/c date is: 2 days              Patient currently is not medically stable to d/c.  Consultants:   GI Procedures:   none Antimicrobials:  Anti-infectives (From admission, onward)   None       Objective: Vitals:   09/13/20 1700 09/13/20 1901 09/13/20 2210 09/14/20 0423  BP: 104/64 (!) 116/59 125/69 (!) 119/59  Pulse: 63 60 63 63  Resp: 20 18 16 16   Temp:  98.3 F (36.8 C) 97.8 F (36.6 C) 97.9 F (36.6 C)  TempSrc:  Oral Oral Oral  SpO2: 100% (!) 86% 98% 96%  Weight:    82.7 kg  Height:        Intake/Output Summary (Last 24 hours) at 09/14/2020 0731 Last data filed at 09/13/2020 2200 Gross per 24 hour  Intake 710 ml  Output 450 ml  Net 260 ml   Filed Weights   09/12/20 1624 09/13/20 0547 09/14/20 0423  Weight: 80.8 kg 78.9 kg 82.7 kg    Examination: General exam: Appears comfortable  HEENT: PERRLA, oral mucosa moist, no sclera icterus or thrush Respiratory system: Coarse crackles at bases-  Respiratory effort  normal. Cardiovascular system: S1 & S2 heard, RRR.   Gastrointestinal system: Abdomen soft, non-tender, nondistended. Normal bowel sounds. Central nervous system: Alert and oriented. No focal neurological deficits. Extremities: No cyanosis, clubbing or edema Skin: No rashes or ulcers Psychiatry:  Mood & affect appropriate.     Data Reviewed: I have personally reviewed following labs and imaging studies  CBC: Recent Labs  Lab 09/12/20 0015 09/12/20 0644 09/12/20 0957 09/13/20 0337 09/14/20 0214  WBC 13.2*  --  10.7* 9.6 11.8*  NEUTROABS 9.8*  --   --   --   --   HGB 8.3* 7.1* 7.2* 7.3* 8.1*  HCT 26.9*  --  24.9* 23.8* 25.0*  MCV 105.5*  --  112.2* 104.4* 102.0*  PLT 318  --  267 263 536   Basic Metabolic Panel: Recent Labs  Lab 09/12/20 0015 09/13/20 0337 09/14/20 0214  NA 137 140 139  K 4.2 3.6 3.5  CL 102 103 102  CO2 25 26 26   GLUCOSE 181* 104* 167*  BUN 43* 42* 39*  CREATININE 0.98 0.88 1.05  CALCIUM 9.1 8.6* 8.7*  MG 2.4 2.2 2.0   GFR: Estimated Creatinine Clearance: 55.4 mL/min (by C-G formula based on SCr of 1.05 mg/dL). Liver Function Tests: Recent Labs  Lab 09/12/20 0644 09/13/20 0337 09/14/20 0214  AST 66* 102* 83*  ALT 72* 109* 115*  ALKPHOS 130* 134* 144*  BILITOT 0.7 0.8 0.8  PROT 6.0* 5.7* 5.6*  ALBUMIN 3.4* 3.2* 3.0*   No results for input(s): LIPASE, AMYLASE in the last 168 hours. No results for input(s): AMMONIA in the last 168 hours. Coagulation Profile: Recent Labs  Lab 09/12/20 0015 09/13/20 0337 09/14/20 0214  INR 1.3* 1.3* 1.4*   Cardiac Enzymes: No results for input(s): CKTOTAL, CKMB, CKMBINDEX, TROPONINI in the last 168 hours. BNP (last 3 results) No results for input(s): PROBNP in the last 8760 hours. HbA1C: No results for input(s): HGBA1C in the last 72 hours. CBG: No results for input(s): GLUCAP in the last 168 hours. Lipid Profile: No results for input(s): CHOL, HDL, LDLCALC, TRIG, CHOLHDL, LDLDIRECT in the last  72 hours. Thyroid Function Tests: No results for input(s): TSH, T4TOTAL, FREET4, T3FREE, THYROIDAB in the last 72 hours. Anemia Panel: Recent Labs    09/12/20 0645 09/12/20 0646  VITAMINB12 520  --   FOLATE 7.4  --   FERRITIN 67  --   TIBC 277  --   IRON 39*  --   RETICCTPCT  --  6.0*   Urine analysis:    Component Value Date/Time   COLORURINE YELLOW 06/01/2016 1347   APPEARANCEUR CLOUDY (A) 06/01/2016 1347   APPEARANCEUR Clear 01/20/2016 0959   LABSPEC 1.026 06/01/2016 1347   PHURINE 7.0 06/01/2016 1347  GLUCOSEU NEGATIVE 06/01/2016 [place] 06/01/2016 [place] 06/01/2016 1347   BILIRUBINUR Negative 01/20/2016 0959   KETONESUR NEGATIVE 06/01/2016 1347   PROTEINUR NEGATIVE 06/01/2016 1347   UROBILINOGEN negative 09/10/2014 1233   UROBILINOGEN 0.2 09/23/2009 2141   NITRITE NEGATIVE 06/01/2016 1347   LEUKOCYTESUR TRACE (A) 06/01/2016 1347   LEUKOCYTESUR Negative 01/20/2016 0959   Sepsis Labs: @LABRCNTIP (procalcitonin:4,lacticidven:4) ) Recent Results (from the past 240 hour(s))  Resp Panel by RT-PCR (Flu A&B, Covid) Nasopharyngeal Swab     Status: None   Collection Time: 09/12/20 12:14 AM   Specimen: Nasopharyngeal Swab; Nasopharyngeal(NP) swabs in vial transport medium  Result Value Ref Range Status   SARS Coronavirus 2 by RT PCR NEGATIVE NEGATIVE Final    Comment: (NOTE) SARS-CoV-2 target nucleic acids are NOT DETECTED.  The SARS-CoV-2 RNA is generally detectable in upper respiratory specimens during the acute phase of infection. The lowest concentration of SARS-CoV-2 viral copies this assay can detect is 138 copies/mL. A negative result does not preclude SARS-Cov-2 infection and should not be used as the sole basis for treatment or other patient management decisions. A negative result may occur with  improper specimen collection/handling, submission of specimen other than nasopharyngeal swab, presence of viral mutation(s) within  the areas targeted by this assay, and inadequate number of viral copies(<138 copies/mL). A negative result must be combined with clinical observations, patient history, and epidemiological information. The expected result is Negative.  Fact Sheet for Patients:  EntrepreneurPulse.com.au  Fact Sheet for Healthcare Providers:  IncredibleEmployment.be  This test is no t yet approved or cleared by the [place] FDA and  has been authorized for detection and/or diagnosis of SARS-CoV-2 by FDA under an Emergency Use Authorization (EUA). This EUA will remain  in effect (meaning this test can be used) for the duration of the COVID-19 declaration under Section 564(b)(1) of the Act, 21 U.S.C.section 360bbb-3(b)(1), unless the authorization is terminated  or revoked sooner.       Influenza A by PCR NEGATIVE NEGATIVE Final   Influenza B by PCR NEGATIVE NEGATIVE Final    Comment: (NOTE) The Xpert Xpress SARS-CoV-2/FLU/RSV plus assay is intended as an aid in the diagnosis of influenza from Nasopharyngeal swab specimens and should not be used as a sole basis for treatment. Nasal washings and aspirates are unacceptable for Xpert Xpress SARS-CoV-2/FLU/RSV testing.  Fact Sheet for Patients: EntrepreneurPulse.com.au  Fact Sheet for Healthcare Providers: IncredibleEmployment.be  This test is not yet approved or cleared by the [place] FDA and has been authorized for detection and/or diagnosis of SARS-CoV-2 by FDA under an Emergency Use Authorization (EUA). This EUA will remain in effect (meaning this test can be used) for the duration of the COVID-19 declaration under Section 564(b)(1) of the Act, 21 U.S.C. section 360bbb-3(b)(1), unless the authorization is terminated or revoked.  Performed at [facility], [address], [place], [place] [address]   MRSA PCR Screening     Status: None   Collection Time:  09/12/20  4:15 PM   Specimen: Nasal Mucosa; Nasopharyngeal  Result Value Ref Range Status   MRSA by PCR NEGATIVE NEGATIVE Final    Comment:        The GeneXpert MRSA Assay (FDA approved for NASAL specimens only), is one component of a comprehensive MRSA colonization surveillance program. It is not intended to diagnose MRSA infection nor to guide or monitor treatment for MRSA infections. Performed at [facility], [address] [place], [place] [address]  Radiology Studies: DG CHEST PORT 1 VIEW  Result Date: 09/13/2020 CLINICAL DATA:  Pulmonary edema EXAM: PORTABLE CHEST - 1 VIEW COMPARISON:  the previous day's study FINDINGS: Mild interstitial edema or infiltrates, improved since previous. Improved left retrocardiac aeration. Stable cardiomegaly. Aortic Atherosclerosis (ICD10-170.0). Stable left subclavian dual lead transvenous pacemaker. Blunting of lateral costophrenic angles as before.  No pneumothorax. Mild diffuse osteopenia. IMPRESSION: Interval improvement in bilateral edema/infiltrates. Electronically Signed   By: Lucrezia Europe M.D.   On: 09/13/2020 08:16      Scheduled Meds: . atorvastatin  20 mg Oral QPM  . calcium carbonate  500 mg of elemental calcium Oral Q breakfast  . Chlorhexidine Gluconate Cloth  6 each Topical Daily  . cholecalciferol  2,000 Units Oral Daily  . citalopram  20 mg Oral Daily  . ferrous sulfate  325 mg Oral Q breakfast  . furosemide  20 mg Intravenous Daily  . lisinopril  2.5 mg Oral Daily  . multivitamin  1 tablet Oral BID  . pantoprazole (PROTONIX) IV  40 mg Intravenous Q12H  . PHENobarbital  64.8 mg Oral BID  . phenytoin  100 mg Oral TID PC & HS  . spironolactone  25 mg Oral Daily   Continuous Infusions:   LOS: 1 day      Debbe Odea, MD [facility] Pager: [email or web address] 09/14/2020, 7:31 AM

## 2020-09-14 NOTE — Consult Note (Signed)
Palliative Medicine Inpatient Consult Note  Reason for consult:  Goals of care, advanced care planning  HPI:  Per intake H&P on 09/12/20 By Dr. Olevia Bowens --> "Joshua [name] is a 86 y.o. male with medical history significant of intracerebral AV malformation with a history of intracerebral bleed and seizure treated with craniotomy in 1980, osteoarthritis, lower extremity edema, bladder cancer, CAD, COPD, Mobitz 1 arrhythmia, pacemaker placement GERD, hyperlipidemia, hypertension, macular degeneration, vitamin D deficiency who is coming to the emergency department due to progressively worse dyspnea for the past 2 days and an episode of dark tarry looking stools yesterday.  He takes a daily iron supplement, but states that usually the stools do not look as dark.  He denies abdominal pain, nausea, emesis, diarrhea, constipation or BRBPR.  He mentions that he has been taking Aleve nearly daily for the past year.  No fever, chills, but positive generalized weakness.  No wheezing or hemoptysis.  No chest pain, palpitations, diaphoresis, dysuria, frequency or hematuria.  No polyuria, polydipsia, polyphagia or blurred vision."   Clinical Assessment/Goals of Care: I have reviewed medical records including EPIC notes, labs and imaging, received report from bedside RN, assessed the patient.    I met with Joshua [name] and his son Joshua [name]  to further discuss diagnosis prognosis, GOC, EOL wishes, disposition and options. He also has a daughter Joshua [name] who is very involved in his life.    I introduced Palliative Medicine as specialized medical care for people living with serious illness. It focuses on providing relief from the symptoms and stress of a serious illness. The goal is to improve quality of life for both the patient and the family.  A detailed discussion was had today regarding advanced directives.  Concepts specific to code status, artifical feeding and hydration, continued IV antibiotics and rehospitalization was  had.  The difference between a aggressive medical intervention path  and a palliative comfort care path for this patient at this time was had. 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 He has lived alone since his wife died 76 years ago. He has performed his own ADLs until this admission. He does have a cleaning lady who comes in once a week. He walks when out of the home with a waling staff due to his height to maintain upright posture. His goal is to get better and back home and to work. He wishes for aggressive therapy and would like pulmonary and cardiology involved in his inpatient care. He is agreeable to inpatient rehab, preferably at [facility] in [place] and outpatient palliative medicine following him after discharge. His son agrees to bring in the living will for [place] to copy for the medical record.  We discussed the importance of not using Aleve (naprosyn) or ibuprofen while on blood thinners (Plavix).  Discussed the importance of continued conversation with family and their  medical providers regarding overall plan of care and treatment options, ensuring decisions are within the context of the patients values and GOCs.  Decision Maker: patient  SUMMARY OF RECOMMENDATIONS    Code Status/Advance Care Planning: FULL CODE  Symptom Management:  Breathlessness/dyspnea-oxygen,  albuterol nebs   Palliative Prophylaxis:    Pain- acetaminophen  Dry eyes- artificial tears  Itching skin- Sarna  Calf muscle pain- Ben gay ointment  Additional Recommendations (  Limitations, Scope, Preferences):  Patient verbalizes preferences as he would like short tem ventilator if needed. He  desires not to  be on any machines to maintain life long term, including dialysis.   Son wishes for cardiology and  pulmonology to be involved in his inpatient care considering his extensive history. This was relayed via secure chat to Dr. Wynelle Cleveland.   Palliative medicine outpatient referral at discharge for continued support.   Psycho-social/Spiritual:   Desire for further Chaplaincy support: no   Prognosis: Good  Discharge Planning: Inpatient rehab, preference for [facility] in [place] with BMI 09/14/2020 09/14/2020 09/13/2020  Height - - -  Weight - 182 lbs 5 oz -  BMI - 66.06 -  Systolic [phone]  Diastolic 73 59 69  Pulse 60 63 63     PPS: 50%   This conversation/these recommendations were discussed with patient primary care team, Dr. Wynelle Cleveland via secure chat.  Thank you for the opportunity to participate in the care of this patient and family.   Time In: 13:40 Time Out: 14:05 Total Time: >70 minutes Greater than 50%  of this time was spent counseling and coordinating care related to the above assessment and plan.  Lindell Spar, NP [facility] Palliative Medicine Team Team Cell Phone: [phone] Please utilize secure chat with additional questions, if there is no response within 30 minutes please call the above phone number  Palliative Medicine Team providers are available by phone from 7am to 7pm daily and can be reached through the team cell phone.  Should this patient require assistance outside of these hours, please call the patient's attending physician.

## 2020-09-14 NOTE — TOC Progression Note (Signed)
Transition of Care [facility]) - Progression Note    Patient Details  Name: Yoshio Seliga MRN: [id number] Date of Birth: April 21, 1932  Transition of [facility] CM/SW [place], [place] Phone Number: 09/14/2020, 9:19 AM  Clinical Narrative:    [facility]. [facility] and requested that [facility] pt. Admissions will review and call CSW back.    Expected Discharge Plan: Skilled Nursing Facility Barriers to Discharge: Continued Medical Work up  Expected Discharge Plan and Services Expected Discharge Plan: [place] In-house Referral: NA Discharge Planning Services: NA Post Acute Care Choice: NA                   DME Arranged: N/A DME Agency: NA       HH Arranged: NA HH Agency: NA         Social Determinants of Health (SDOH) Interventions    Readmission Risk Interventions Readmission Risk Prevention Plan 09/13/2020  Transportation Screening Complete  PCP or Specialist Appt within 3-5 Days Not Complete  Not Complete comments Will need appt closer to discharge  [place] or [place] Complete  Social Work Consult for [place] Planning/Counseling Complete  Palliative Care Screening Not Applicable  Medication Review Press photographer) Complete  Some recent data might be hidden

## 2020-09-14 NOTE — Consult Note (Signed)
NAME:  Joshua [name], MRN:  [id number], DOB:  06/09/1932, LOS: 1 ADMISSION DATE:  09/12/2020, CONSULTATION DATE: 09/14/20 REFERRING MD:  Dr. Wynelle Cleveland, CHIEF COMPLAINT:  SOB    Brief History:  86 y/o M admitted with progressive dyspnea.    History of Present Illness:  86 y/o M, former smoker, 3L O2 dependent since 06/2020 who presented to [facility] on 12/25 with two week hx of worsening shortness of breath.   The patient was seen by Dr. Vaughan Browner on 11/2 after admission for hypoxia (06/25/20) thought related to CAP, acute on chronic diastolic CHF and possible ILD.  The patient was treated during that admission for CAP and was diuresed.  CT at that time showed bronchiectasis with reticulation and fluid in the fissure. ECHO showed normal EF, grade I diastolic dysfunction and moderate elevation of pulmonary pressures.  He was discharged on prednisone and oxygen.  He followed up with Pulmonary on 12/2 with ANA, CCP, myositis and HSP ordered with plan to follow up CT in 1-2 months.  Of note, he was brought into the clinic visit by EMS after family called for weakness / fatigue.  ANA was negative, HSP negative and CCP normal. At the 12/2 visit, he was prescribed levaquin and prednisone for possible RLL airspace disease. Since that visit, chart review shows several tele visits with mention of increased home care needs / difficulty providing care at home. He had a virtual follow up visit on 12/10 and felt well at that time. Symptoms had resolved.   The patient reportedly takes aleve almost daily and has for the past year.  He was admitted 12/25 for increasing dyspnea.  O2 needs review shows max O2 of 4L since admit.  Primary team reports active diuresis, O2 needs at baseline. HRCT assessed which favors UIP with emphysema overlap and component of fluid. He was noted to have a significant anemia with NSAID use and occult positive stools.    PCCM consulted for evaluation.   Past Medical History:  HTN HLD  CAD  STEMI s/p  DES Chronic dCHF AV Block s/p Pacemaker  ICH - 1980 in setting of AVM, seizures after  Macular Degeneration  COPD with Emphysema  Former Smoker - quit 1974, 30 pk year hx GERD  Vitamin D Deficiency  Prostate Cancer - Stage T2a, Gleason 7 Bladder Cancer s/p resection of bladder tumor   Significant Hospital Events:  12/25 Admit  12/27 PCCM consulted   Consults:    Procedures:    Significant Diagnostic Tests:   ECHO 10/5 >> LVEF 50-55%, no RWMA, grade I diastolic dysfunction, moderately elevated pulmonary pressure  CT Chest w/o 10/7 >> emphysema with diffuse mild cylindrical bronchiectasis and subpleural reticulation suggesting underlying fibrotic lung disease, apparent loculated fluid posterior major fissure on the right with subpleural rind of consolidative opacity in the deep posterior right costophrenic sulcus  SLP MBSS 10/8 >> silent aspiration of thin liquids, rec's for chin tuck reg diet / thin liquids   HRCT 12/27 >> appearance of lungs compatible with interstitial lung disease, with a spectrum of findings considered probable usual interstitial pneumonia, diffuse bronchial wall thickening with mild centrilobular and paraseptal emphysema, 3 vessel CAD, calcifications of the aortic valve and mitral annulus, mild cardiomegaly   Micro Data:  COVID 12/25 >> negative  Influenza A/B 12/25 >> negative   Antimicrobials:  None   Interim History / Subjective:  Consulted  Objective   Blood pressure 107/73, pulse 60, temperature 97.6 F (36.4 C), temperature source Oral, resp.  rate (!) 21, height 6' (1.829 m), weight 82.7 kg, SpO2 100 %.        Intake/Output Summary (Last 24 hours) at 09/14/2020 1525 Last data filed at 09/14/2020 [id number] Gross per 24 hour  Intake 480 ml  Output 1050 ml  Net -570 ml   Filed Weights   09/12/20 1624 09/13/20 0547 09/14/20 0423  Weight: 80.8 kg 78.9 kg 82.7 kg    Examination: Constitutional: frail elderly man in NAD  Eyes: EOMI, pupils  equal Ears, nose, mouth, and throat: MM dry, JVD ~2cm above sternal notch with +HJR Cardiovascular: RRR, ext warm, faint systolic murmur Respiratory: Crackles at bases, diminished breath sounds apices, no accessory muscle use Gastrointestinal: soft, +BS Skin: No rashes, normal turgor Neurologic: moves all 4 ext to command Psychiatric: RASS 0, fair insight  Hgb/ferritin/iron all down from last admission CT personally reviewed: UIP/emphysema overlap with mild superimposed GG in cephalizing pattern as well as enlarging BL effusions c/w pulmonary edema   Resolved Hospital Problem list     Assessment & Plan:  DOE- most notable difference is drop in H/H (which normally should not be this symptomatic but in patient like this with frail cardiopulmonary baseline is exaggerated).  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  Cardiac diagnoses include diastolic heart failure and groups 2/3 pulmonary HTN)  Agree with GI workup especially with recent prolonged steroids and NSAID use.    With regards to fluid status, would try to diurese 1 more day (BUN actually better), monitor renal function, cardiology to weigh in at some point today as well.    His baseline would be MMRC 1 dyspnea with 3L on exertion.  I think he is close to this but fatigue from his anemia is clouding picture.  Would give more aggressive iron repletion.  Worsening RLS symptoms suspect from Fe deficiency  - Think EGD reasonable after plavix washout, continue IV PPI - double PO iron, IV iron x 1 dose (250mg ), for target Hgb 10 (where he is at baseline), he is about 1300mg  deficient (available from [email or web address]) - Lasix x 1, monitor UoP, await cardiology recs - SLP re-eval for either (A) repeat MBSS or (B) re-emphasizing importance of chin tuck - Add IS, flutter to help secretion clearance with his basilar bronchiectasis  (which is UIP +/- chronic aspiration syndrome) - I do not think his UIP is in flare nor does he seem to tolerate steroids well - Albuterol PRN works   Building surveyor (evaluated daily)  Diet: per primary  Pain/Anxiety/Delirium protocol (if indicated): n/a  VAP protocol (if indicated): n/a  DVT prophylaxis: per primary  GI prophylaxis: per primary  Glucose control: per primary  Mobility: as tolerated  Disposition:per primary   Goals of Care:  Last date of multidisciplinary goals of care discussion: per primary  Family and staff present:  Summary of discussion:   Follow up goals of care discussion due:  Code Status: Full Code   Labs   CBC: Recent Labs  Lab 09/12/20 0015 09/12/20 0644 09/12/20 0957 09/13/20 0337 09/14/20 0214  WBC 13.2*  --  10.7* 9.6 11.8*  NEUTROABS 9.8*  --   --   --   --   HGB 8.3* 7.1* 7.2* 7.3* 8.1*  HCT 26.9*  --  24.9* 23.8* 25.0*  MCV 105.5*  --  112.2* 104.4* 102.0*  PLT 318  --  267 263 [id number]    Basic Metabolic Panel: Recent Labs  Lab 09/12/20 0015 09/13/20 0337 09/14/20  0214  NA 137 140 139  K 4.2 3.6 3.5  CL 102 103 102  CO2 25 26 26   GLUCOSE 181* 104* 167*  BUN 43* 42* 39*  CREATININE 0.98 0.88 1.05  CALCIUM 9.1 8.6* 8.7*  MG 2.4 2.2 2.0   GFR: Estimated Creatinine Clearance: 55.4 mL/min (by C-G formula based on SCr of 1.05 mg/dL). Recent Labs  Lab 09/12/20 0015 09/12/20 0957 09/13/20 0337 09/14/20 0214  WBC 13.2* 10.7* 9.6 11.8*    Liver Function Tests: Recent Labs  Lab 09/12/20 0644 09/13/20 0337 09/14/20 0214  AST 66* 102* 83*  ALT 72* 109* 115*  ALKPHOS 130* 134* 144*  BILITOT 0.7 0.8 0.8  PROT 6.0* 5.7* 5.6*  ALBUMIN 3.4* 3.2* 3.0*   No results for input(s): LIPASE, AMYLASE in the last 168 hours. No results for input(s): AMMONIA in the last 168 hours.  ABG    Component Value Date/Time   TCO2 24 11/10/2016 0746     Coagulation Profile: Recent Labs  Lab 09/12/20 0015 09/13/20 0337 09/14/20 0214   INR 1.3* 1.3* 1.4*    Cardiac Enzymes: No results for input(s): CKTOTAL, CKMB, CKMBINDEX, TROPONINI in the last 168 hours.  HbA1C: Hgb A1c MFr Bld  Date/Time Value Ref Range Status  06/02/2016 04:52 AM 5.0 4.8 - 5.6 % Final    Comment:    (NOTE)         Pre-diabetes: 5.7 - 6.4         Diabetes: >6.4         Glycemic control for adults with diabetes: <7.0   04/27/2016 11:33 AM 5.5 4.8 - 5.6 % Final    Comment:    (NOTE)         Pre-diabetes: 5.7 - 6.4         Diabetes: >6.4         Glycemic control for adults with diabetes: <7.0     CBG: No results for input(s): GLUCAP in the last 168 hours.  Review of Systems:   Gen: fever, chills, weight change, fatigue, night sweats HEENT: blurred vision, double vision, hearing loss, tinnitus, sinus congestion, rhinorrhea, sore throat, neck stiffness, dysphagia PULM: shortness of breath, cough, sputum production, hemoptysis, wheezing CV: chest pain, edema, orthopnea, paroxysmal nocturnal dyspnea, palpitations GI: abdominal pain, nausea, vomiting, diarrhea, hematochezia, melena, constipation, change in bowel habits GU: dysuria, hematuria, polyuria, oliguria, urethral discharge Endocrine: hot or cold intolerance, polyuria, polyphagia or appetite change Derm: rash, dry skin, scaling or peeling skin change Heme: easy bruising, bleeding, bleeding gums Neuro: headache, numbness, weakness, slurred speech, loss of memory or consciousness   Past Medical History:  He,  has a past medical history of Arteriovenous malformation, Arthritis, Bilateral lower extremity edema, Bladder tumor, CAD (coronary artery disease) (cardiologist--  dr Daneen Schick), COPD with emphysema [facility]), Dyspnea on exertion, ED (erectile dysfunction), First degree AV block, GERD (gastroesophageal reflux disease), History of adenomatous polyp of colon, History of bladder cancer, History of prostate cancer (urologist-  dr Jeffie Pollock-- last PSA 0.02 (summer 2017)), History of ST  elevation myocardial infarction (STEMI), Hyperlipidemia, Hypertension, Macular degeneration, Presence of permanent cardiac pacemaker, S/P drug eluting coronary stent placement, Second degree Mobitz I AV block, Seizures ([place]) (per pt son -- no seizure's since 1980), and Vitamin D deficiency.   Surgical History:   Past Surgical History:  Procedure Laterality Date  . APPENDECTOMY  2011  . BLEPHAROPLASTY Bilateral revision 02-17-2016  . CARDIAC CATHETERIZATION N/A 04/27/2016   Procedure: Left Heart Cath and Coronary  Angiography;  Surgeon: Lyn Records, MD;  Location: [facility] INVASIVE CV LAB;  Service: Cardiovascular;  Laterality: N/A;  acute inferior wall STEMI ;  ostCx to midCx 90%,  ost1st Mrg to 1st Mrg 50%,  mid to distal RCA 100%,  pRCA 80%,  LVEF 45-50% w/ inferobasal akinesis  . CARDIAC CATHETERIZATION N/A 04/27/2016   Procedure: Temporary Pacemaker;  Surgeon: Lyn Records, MD;  Location: [facility] INVASIVE CV LAB;  Service: Cardiovascular;  Laterality: N/A;  mobitz 2  second degree HB w/ heart rate 29bpm  . CARDIAC CATHETERIZATION N/A 04/27/2016   Procedure: Coronary Stent Intervention;  Surgeon: Lyn Records, MD;  Location: [facility] INVASIVE CV LAB;  Service: Cardiovascular;  Laterality: N/A; DES x1 to  Proximal RCA;  DES x1 to Mid RCA  . CATARACT EXTRACTION W/ INTRAOCULAR LENS  IMPLANT, BILATERAL  2016  approx.  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 CYSTOSCOPY  01/19/2012   Procedure: CYSTOSCOPY;  Surgeon: [name], MD;  Location: [facility];  Service: Urology;;  no seeds found in bladder  . CYSTOSCOPY W/ RETROGRADES Bilateral 04/19/2016   Procedure: CYSTOSCOPY WITH BILATERAL RETROGRADE PYELOGRAM TRANSURETHRAL RESECTION OF BLADDER TUMOR ;  Surgeon: Bjorn Pippin, MD;  Location: [facility];  Service: Urology;  Laterality: Bilateral;  . CYSTOSCOPY WITH BIOPSY N/A 11/10/2016   Procedure: CYSTOSCOPY WITH BIOPSY AND FULGURATION;  Surgeon: Bjorn Pippin,  MD;  Location: [facility];  Service: Urology;  Laterality: N/A;  . INSERT / REPLACE / REMOVE PACEMAKER  06/15/2017  . LEAD EXTRACTION N/A 01/22/2018   Procedure: LEAD EXTRACTION;  Surgeon: Marinus Maw, MD;  Location: [facility] INVASIVE CV LAB;  Service: Cardiovascular;  Laterality: N/A;  . PACEMAKER IMPLANT N/A 06/15/2017   Procedure: Pacemaker Implant;  Surgeon: Hillis Range, MD;  Location: [facility] INVASIVE CV LAB;  Service: Cardiovascular;  Laterality: N/A;  . PACEMAKER IMPLANT N/A 01/25/2018   Procedure: PACEMAKER IMPLANT;  Surgeon: Marinus Maw, MD;  Location: [facility] INVASIVE CV LAB;  Service: Cardiovascular;  Laterality: N/A;  . PPM GENERATOR REMOVAL N/A 01/22/2018   Procedure: PPM GENERATOR REMOVAL;  Surgeon: Marinus Maw, MD;  Location: [facility] INVASIVE CV LAB;  Service: Cardiovascular;  Laterality: N/A;  . RADIOACTIVE SEED IMPLANT  01/19/2012   Procedure: RADIOACTIVE SEED IMPLANT;  Surgeon: [name], MD;  Location: [facility];  Service: Urology;  Laterality: N/A;  68 seeds implanted  . TRANSTHORACIC ECHOCARDIOGRAM  06/02/2016   ef 60-65%/  trivial MR/  mild TR     Social History:   reports that he quit smoking about 47 years ago. He has a 30.00 pack-year smoking history. He has never used smokeless tobacco. He reports current alcohol use. He reports that he does not use drugs.   Family History:  His family history includes Emphysema in his father and mother; Stroke in his mother.   Allergies Allergies  Allergen Reactions  . Penicillins Rash    Has patient had a PCN reaction causing immediate rash, facial/tongue/throat swelling, SOB or lightheadedness with hypotension: ##Yes## Has patient had a PCN reaction causing severe rash involving mucus membranes or skin necrosis: No Has patient had a PCN reaction that required hospitalization No Has patient had a PCN reaction occurring within the last 10 years: No If all of the above answers are "NO", then may proceed with  Cephalosporin use.   . Prednisone     "shaking and hands twitching"  Home Medications  Prior to Admission medications   Medication Sig Start Date End Date Taking? Authorizing Provider  albuterol (VENTOLIN HFA) 108 (90 Base) MCG/ACT inhaler Inhale 1-2 puffs into the lungs every 6 (six) hours as needed for wheezing or shortness of breath. 08/20/20  Yes Martyn Ehrich, NP  aspirin EC 81 MG tablet Take 81 mg by mouth daily. Swallow whole.   Yes [provider]  atorvastatin (LIPITOR) 20 MG tablet Take 1 tablet (20 mg total) by mouth daily. 07/01/20  Yes Martin, Mary-Margaret, FNP  azithromycin (ZITHROMAX) 250 MG tablet Take 1 tablet (250 mg total) by mouth as directed. 09/09/20  Yes Mannam, Praveen, MD  calcium carbonate (OSCAL) 1500 (600 Ca) MG TABS tablet Take 600 mg of elemental calcium by mouth daily with breakfast.   Yes [provider]  Cholecalciferol (VITAMIN D3) 50 MCG (2000 UT) capsule Take 2,000 Units by mouth daily.    Yes [provider]  citalopram (CELEXA) 20 MG tablet TAKE 1 TABLET DAILY 08/31/20  Yes Hassell Done, Mary-Margaret, FNP  clopidogrel (PLAVIX) 75 MG tablet Take 1 tablet (75 mg total) by mouth daily. 08/28/20  Yes Martin, Mary-Margaret, FNP  famotidine (PEPCID) 20 MG tablet Take 20 mg by mouth daily.    Yes [provider]  ferrous sulfate 325 (65 FE) MG tablet Take 1 tablet (325 mg total) by mouth daily with breakfast. 10/31/18  Yes Gatha Mayer, MD  furosemide (LASIX) 40 MG tablet Take 0.5 tablets (20 mg total) by mouth daily. 06/26/20 07/26/20 Yes Sheikh, Omair Latif, DO  MAGNESIUM PO Take 1 tablet by mouth at bedtime.   Yes [provider]  Multiple Vitamins-Minerals (PRESERVISION AREDS 2) CAPS Take 1 capsule by mouth 2 (two) times daily.    Yes [provider]  PHENobarbital (LUMINAL) 64.8 MG tablet Take 1 tablet (64.8 mg total) by mouth 2 (two) times daily. 07/01/20  Yes Hassell Done, Mary-Margaret, FNP  phenytoin  (DILANTIN) 100 MG ER capsule Take 1 capsule (100 mg total) by mouth in the morning, at noon, in the evening, and at bedtime. 09/09/20  Yes Hassell Done, Mary-Margaret, FNP  polyvinyl alcohol (LIQUIFILM TEARS) 1.4 % ophthalmic solution Place 2 drops into both eyes daily as needed for dry eyes.    Yes [provider]  predniSONE (DELTASONE) 20 MG tablet Take 1 tablet (20 mg total) by mouth daily with breakfast. 09/09/20  Yes Martyn Ehrich, NP  spironolactone (ALDACTONE) 25 MG tablet Take 1 tablet (25 mg total) by mouth daily. 09/01/20  Yes Chevis Pretty, FNP     Critical care time: n/a

## 2020-09-15 DIAGNOSIS — I5033 Acute on chronic diastolic (congestive) heart failure: Secondary | ICD-10-CM | POA: Diagnosis not present

## 2020-09-15 DIAGNOSIS — R0602 Shortness of breath: Secondary | ICD-10-CM | POA: Diagnosis not present

## 2020-09-15 LAB — COMPREHENSIVE METABOLIC PANEL
ALT: 94 U/L — ABNORMAL HIGH (ref 0–44)
AST: 61 U/L — ABNORMAL HIGH (ref 15–41)
Albumin: 3 g/dL — ABNORMAL LOW (ref 3.5–5.0)
Alkaline Phosphatase: 153 U/L — ABNORMAL HIGH (ref 38–126)
Anion gap: 11 (ref 5–15)
BUN: 35 mg/dL — ABNORMAL HIGH (ref 8–23)
CO2: 26 mmol/L (ref 22–32)
Calcium: 8.5 mg/dL — ABNORMAL LOW (ref 8.9–10.3)
Chloride: 99 mmol/L (ref 98–111)
Creatinine, Ser: 1.06 mg/dL (ref 0.61–1.24)
GFR, Estimated: >60 mL/min (ref >60)
Glucose, Bld: 121 mg/dL — ABNORMAL HIGH (ref 70–99)
Potassium: 3.7 mmol/L (ref 3.5–5.1)
Sodium: 136 mmol/L (ref 135–145)
Total Bilirubin: 0.7 mg/dL (ref 0.3–1.2)
Total Protein: 5.6 g/dL — ABNORMAL LOW (ref 6.5–8.1)

## 2020-09-15 LAB — CBC
HCT: 25.1 % — ABNORMAL LOW (ref 39.0–52.0)
Hemoglobin: 8 g/dL — ABNORMAL LOW (ref 13.0–17.0)
MCH: 32.1 pg (ref 26.0–34.0)
MCHC: 31.9 g/dL (ref 30.0–36.0)
MCV: 100.8 fL — ABNORMAL HIGH (ref 80.0–100.0)
Platelets: 268 10*3/uL (ref 150–400)
RBC: 2.49 MIL/uL — ABNORMAL LOW (ref 4.22–5.81)
RDW: 20.5 % — ABNORMAL HIGH (ref 11.5–15.5)
WBC: 9.4 10*3/uL (ref 4.0–10.5)
nRBC: 1.5 % — ABNORMAL HIGH (ref 0.0–0.2)

## 2020-09-15 LAB — PROTIME-INR
INR: 1.4 — ABNORMAL HIGH (ref 0.8–1.2)
Prothrombin Time: 16.2 seconds — ABNORMAL HIGH (ref 11.4–15.2)

## 2020-09-15 LAB — BRAIN NATRIURETIC PEPTIDE: B Natriuretic Peptide: 976.5 pg/mL — ABNORMAL HIGH (ref 0.0–100.0)

## 2020-09-15 LAB — MAGNESIUM: Magnesium: 2 mg/dL (ref 1.7–2.4)

## 2020-09-15 MED ORDER — FERROUS SULFATE 325 (65 FE) MG PO TABS
325.0000 mg | ORAL_TABLET | Freq: Two times a day (BID) | ORAL | Status: DC
  Administered 2020-09-17 – 2020-09-18 (×4): 325 mg via ORAL
  Filled 2020-09-15 (×3): qty 1

## 2020-09-15 MED ORDER — SODIUM CHLORIDE 0.9 % IV SOLN
250.0000 mg | Freq: Once | INTRAVENOUS | Status: AC
  Administered 2020-09-15: 13:00:00 250 mg via INTRAVENOUS
  Filled 2020-09-15: qty 20

## 2020-09-15 MED ORDER — FUROSEMIDE 10 MG/ML IJ SOLN
40.0000 mg | Freq: Every day | INTRAMUSCULAR | Status: DC
  Administered 2020-09-15 – 2020-09-17 (×2): 40 mg via INTRAVENOUS
  Filled 2020-09-15 (×2): qty 4

## 2020-09-15 NOTE — Consult Note (Signed)
   [facility] CM Inpatient Consult   09/15/2020  Joshua [name] Mar 25, 1932 [id number]   Patient screened for high risk score for unplanned readmission. Chart reviewed to assess for potential [facility] Care Management community service needs. Per review, patient is being recommended for a skilled nursing facility level of care.    No [facility] Care Management follow up needs at this time.   Christophe Louis, MSN, RN [facility] Liaison Nurse Mobile Phone [phone]  Toll free office [phone]

## 2020-09-15 NOTE — Progress Notes (Signed)
Patent examiner [facility])  Hospital Liaison: RN note       Notified by [facility] manager of patient/family request for [facility] Palliative services at [facility] after discharge.       Spoke with son Sharon Seller to confirm interest and explain services and at this time would like to hold off and discuss with his father. I have given [facility] numbers incase he was to change his mind.        Please call with any hospice or palliative related questions.       Thank you for this referral.       Yolande Jolly, BSN, RN [facility] Liaison (listed on [facility] under [facility] [place])   [phone]

## 2020-09-15 NOTE — Progress Notes (Signed)
NAME:  Joshua [name], MRN:  [id number], DOB:  Nov 09, 1931, LOS: 2 ADMISSION DATE:  09/12/2020, CONSULTATION DATE: 09/14/20 REFERRING MD:  Dr. Wynelle Cleveland, CHIEF COMPLAINT:  SOB    Brief History:  86 y/o M admitted with progressive dyspnea.    History of Present Illness:  86 y/o M, former smoker, 3L O2 dependent since 06/2020 who presented to [facility] on 12/25 with two week hx of worsening shortness of breath.   The patient was seen by Dr. Vaughan Browner on 11/2 after admission for hypoxia (06/25/20) thought related to CAP, acute on chronic diastolic CHF and possible ILD.  The patient was treated during that admission for CAP and was diuresed.  CT at that time showed bronchiectasis with reticulation and fluid in the fissure. ECHO showed normal EF, grade I diastolic dysfunction and moderate elevation of pulmonary pressures.  He was discharged on prednisone and oxygen.  He followed up with Pulmonary on 12/2 with ANA, CCP, myositis and HSP ordered with plan to follow up CT in 1-2 months.  Of note, he was brought into the clinic visit by EMS after family called for weakness / fatigue.  ANA was negative, HSP negative and CCP normal. At the 12/2 visit, he was prescribed levaquin and prednisone for possible RLL airspace disease. Since that visit, chart review shows several tele visits with mention of increased home care needs / difficulty providing care at home. He had a virtual follow up visit on 12/10 and felt well at that time. Symptoms had resolved.   The patient reportedly takes aleve almost daily and has for the past year.  He was admitted 12/25 for increasing dyspnea.  O2 needs review shows max O2 of 4L since admit.  Primary team reports active diuresis, O2 needs at baseline. HRCT assessed which favors UIP with emphysema overlap and component of fluid. He was noted to have a significant anemia with NSAID use and occult positive stools.    PCCM consulted for evaluation.   Past Medical History:  HTN HLD  CAD  STEMI s/p  DES Chronic dCHF AV Block s/p Pacemaker  ICH - 1980 in setting of AVM, seizures after  Macular Degeneration  COPD with Emphysema  Former Smoker - quit 1974, 30 pk year hx GERD  Vitamin D Deficiency  Prostate Cancer - Stage T2a, Gleason 7 Bladder Cancer s/p resection of bladder tumor   Significant Hospital Events:  12/25 Admit  12/27 PCCM consulted   Consults:    Procedures:    Significant Diagnostic Tests:   ECHO 10/5 >> LVEF 50-55%, no RWMA, grade I diastolic dysfunction, moderately elevated pulmonary pressure  CT Chest w/o 10/7 >> emphysema with diffuse mild cylindrical bronchiectasis and subpleural reticulation suggesting underlying fibrotic lung disease, apparent loculated fluid posterior major fissure on the right with subpleural rind of consolidative opacity in the deep posterior right costophrenic sulcus  SLP MBSS 10/8 >> silent aspiration of thin liquids, rec's for chin tuck reg diet / thin liquids   HRCT 12/27 >> appearance of lungs compatible with interstitial lung disease, with a spectrum of findings considered probable usual interstitial pneumonia, diffuse bronchial wall thickening with mild centrilobular and paraseptal emphysema, 3 vessel CAD, calcifications of the aortic valve and mitral annulus, mild cardiomegaly   Micro Data:  COVID 12/25 >> negative  Influenza A/B 12/25 >> negative   Antimicrobials:  None   Interim History / Subjective:  No events, he is asking a lot of the same questions as yesterday.  Dyspneic with exertion.  He  is eating breakfast and not using his chin tuck technique.  Objective   Blood pressure 114/64, pulse 70, temperature 98.6 F (37 C), temperature source Oral, resp. rate 19, height 6' (1.829 m), weight 83 kg, SpO2 (!) 88 %.        Intake/Output Summary (Last 24 hours) at 09/15/2020 0959 Last data filed at 09/15/2020 4982 Gross per 24 hour  Intake 240 ml  Output 500 ml  Net -260 ml   Filed Weights   09/13/20 0547  09/14/20 0423 09/15/20 0500  Weight: 78.9 kg 82.7 kg 83 kg    Examination: Constitutional: frail elderly man lying in bed  Eyes: EOMI, pupils equal Ears, nose, mouth, and throat: MMM, trachea midline Cardiovascular: RRR, ext warm Respiratory: basilar rales, no accessory muscle use Gastrointestinal: soft, +BS Skin: No rashes, normal turgor Neurologic: moves all 4 ext to command, weak Psychiatric: has fair memory but asking a lot of same questions as yesterday, question mild cognitive impairment  CT personally reviewed: UIP/emphysema overlap with mild superimposed GG in cephalizing pattern as well as enlarging BL effusions c/w pulmonary edema   Resolved Hospital Problem list     Assessment & Plan:  DOE- see discussion 12/27. Suspect related to anemia, mild pulmonary edema, and likely ongoing aspiration from not following chin tuck with thin liquids.  Looks like he refused nectar thick liquids and a repeat MBSS.  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  - Agree with endoscopy and IV lasix as tolerated by renal function - Give another dose IV iron today - Mobility as tolerated - Really needs to follow chin-tuck method if he wants his disease not to progress (it may anyway if there is IPF component) - Will check on him tomorrow with [place] and offer diagnostic/therapeutic right thora if still having breathing issues although suspect low yield  Myrla Halsted MD PCCM

## 2020-09-15 NOTE — Progress Notes (Signed)
Progress Note  Patient Name: Joshua [name] Date of Encounter: 09/15/2020  [facility] Cardiologist: Lesleigh Noe, MD   Subjective   No CP; still with DOE  Inpatient Medications    Scheduled Meds: . atorvastatin  20 mg Oral QPM  . calcium carbonate  500 mg of elemental calcium Oral Q breakfast  . cholecalciferol  2,000 Units Oral Daily  . citalopram  20 mg Oral Daily  . ferrous sulfate  325 mg Oral BID WC  . multivitamin  1 tablet Oral BID  . pantoprazole (PROTONIX) IV  40 mg Intravenous Q12H  . PHENobarbital  64.8 mg Oral BID  . phenytoin  100 mg Oral TID PC & HS  . potassium chloride  20 mEq Oral TID   Continuous Infusions:  PRN Meds: acetaminophen **OR** acetaminophen, albuterol, camphor-menthol, Muscle Rub, ondansetron **OR** ondansetron (ZOFRAN) IV, polyvinyl alcohol   Vital Signs    Vitals:   09/15/20 0030 09/15/20 0423 09/15/20 0500 09/15/20 0813  BP: (!) 111/59 114/62  115/62  Pulse: 63 67  65  Resp: 18 18  20   Temp: 98.3 F (36.8 C) 98.6 F (37 C)    TempSrc: Oral Oral    SpO2: (!) 82% 90%  100%  Weight:   83 kg   Height:        Intake/Output Summary (Last 24 hours) at 09/15/2020 0917 Last data filed at 09/15/2020 0826 Gross per 24 hour  Intake 240 ml  Output 850 ml  Net -610 ml   Last 3 Weights 09/15/2020 09/14/2020 09/13/2020  Weight (lbs) 182 lb 15.7 oz 182 lb 5.1 oz 173 lb 15.1 oz  Weight (kg) 83 kg 82.7 kg 78.9 kg      Telemetry    AV paced - Personally Reviewed   Physical Exam   GEN: No acute distress.  Frail Neck: No JVD Cardiac: RRR Respiratory: Basilar dry crackles; diminished BS bases GI: Soft, nontender, non-distended  MS: No edema Neuro:  Nonfocal  Psych: Normal affect   Labs     Chemistry Recent Labs  Lab 09/13/20 0337 09/14/20 0214 09/15/20 0407  NA 140 139 136  K 3.6 3.5 3.7  CL 103 102 99  CO2 26 26 26   GLUCOSE 104* 167* 121*  BUN 42* 39* 35*  CREATININE 0.88 1.05 1.06  CALCIUM 8.6* 8.7* 8.5*   PROT 5.7* 5.6* 5.6*  ALBUMIN 3.2* 3.0* 3.0*  AST 102* 83* 61*  ALT 109* 115* 94*  ALKPHOS 134* 144* 153*  BILITOT 0.8 0.8 0.7  GFRNONAA >60 >60 >60  ANIONGAP 11 11 11      Hematology Recent Labs  Lab 09/13/20 0337 09/14/20 0214 09/15/20 0407  WBC 9.6 11.8* 9.4  RBC 2.28* 2.45* 2.49*  HGB 7.3* 8.1* 8.0*  HCT 23.8* 25.0* 25.1*  MCV 104.4* 102.0* 100.8*  MCH 32.0 33.1 32.1  MCHC 30.7 32.4 31.9  RDW 20.7* 20.7* 20.5*  PLT 263 263 268    BNP Recent Labs  Lab 09/13/20 0337 09/14/20 0214 09/15/20 0407  BNP 866.0* 849.9* 976.5*      Radiology    CT ABDOMEN PELVIS W CONTRAST  Result Date: 09/14/2020 CLINICAL DATA:  Anemia prostate cancer EXAM: CT ABDOMEN AND PELVIS WITH CONTRAST TECHNIQUE: Multidetector CT imaging of the abdomen and pelvis was performed using the standard protocol following bolus administration of intravenous contrast. CONTRAST:  09/16/20 OMNIPAQUE IOHEXOL 300 MG/ML  SOLN COMPARISON:  CT 09/24/2009, MRI 04/24/2012 FINDINGS: Lower chest: Moderate right and small left pleural effusion.  Cardiomegaly with partially visualized intracardiac pacing leads. Mild subpleural fibrosis. No acute consolidation. Dependent atelectasis in the lower lobes. Hepatobiliary: Small gallstones. No focal hepatic abnormality. No biliary dilatation. Pancreas: Unremarkable. No pancreatic ductal dilatation or surrounding inflammatory changes. Spleen: Normal in size without focal abnormality. Adrenals/Urinary Tract: Adrenal glands are within normal limits. Kidneys show no hydronephrosis. Cysts within the left kidney. Delayed nephrogram with poor excretion of contrast on delayed views. The urinary bladder is unremarkable Stomach/Bowel: The stomach is nonenlarged. No dilated small bowel. No acute bowel wall thickening. Negative appendix. Vascular/Lymphatic: Moderate aortic atherosclerosis. No aneurysm. No suspicious nodes. Reproductive: Post treatment changes of the prostate. Other: Mild presacral  soft tissue stranding presumably due to post treatment change. Edema within the subcutaneous soft tissues consistent with anasarca. No free air or significant free fluid. Musculoskeletal: Degenerative changes of the spine. No acute or suspicious osseous lesions are visualized. Chronic appearing deformity of the right inferior pubic ramus. IMPRESSION: 1. Post treatment changes of the prostate gland. No CT evidence for metastatic disease within the abdomen or pelvis. 2. Moderate right and small left pleural effusions. Cardiomegaly. Generalized subcutaneous edema consistent with anasarca. 3. Cholelithiasis. 4. Delayed nephrogram, consistent with decreased renal function, recommend correlation with appropriate laboratory studies. Aortic Atherosclerosis (ICD10-I70.0). Electronically Signed   By: Donavan Foil M.D.   On: 09/14/2020 22:56   CT Chest High Resolution  Result Date: 09/14/2020 CLINICAL DATA:  86 year old male with history of respiratory failure. Worsening shortness of breath. Evaluate for interstitial lung disease. EXAM: CT CHEST WITHOUT CONTRAST TECHNIQUE: Multidetector CT imaging of the chest was performed following the standard protocol without intravenous contrast. High resolution imaging of the lungs, as well as inspiratory and expiratory imaging, was performed. COMPARISON:  Chest CT 06/25/2020. FINDINGS: Cardiovascular: Heart size is mildly enlarged. There is no significant pericardial fluid, thickening or pericardial calcification. There is aortic atherosclerosis, as well as atherosclerosis of the great vessels of the mediastinum and the coronary arteries, including calcified atherosclerotic plaque in the left main, left anterior descending, left circumflex and right coronary arteries. Calcifications of the aortic valve and mitral annulus. Right-sided pacemaker/AICD with lead tips terminating in the right atrium and right ventricle. Mediastinum/Nodes: No pathologically enlarged mediastinal or hilar  lymph nodes. Please note that accurate exclusion of hilar adenopathy is limited on noncontrast CT scans. Esophagus is unremarkable in appearance. No axillary lymphadenopathy. Lungs/Pleura: Moderate right and small left pleural effusions lying dependently with some associated passive subsegmental atelectasis in the lower lobes of the lungs bilaterally. High-resolution imaging is limited by considerable patient respiratory motion. With these limitations in mind, there are some patchy peripheral predominant areas of ground-glass attenuation, septal thickening, cylindrical bronchiectasis and peripheral bronchiolectasis in the lungs bilaterally, most evident throughout the mid to lower lungs. No frank honeycombing. Diffuse bronchial wall thickening with mild centrilobular and paraseptal emphysema. Inspiratory and expiratory imaging is unremarkable. No acute consolidative airspace disease. No definite suspicious appearing pulmonary nodules or masses are noted. Upper Abdomen: Aortic atherosclerosis. Musculoskeletal: There are no aggressive appearing lytic or blastic lesions noted in the visualized portions of the skeleton. IMPRESSION: 1. The appearance of the lungs is compatible with interstitial lung disease, with a spectrum of findings considered probable usual interstitial pneumonia (UIP) per current ATS guidelines. Repeat high-resolution chest CT is recommended in 12 months to assess for temporal changes in the appearance of the lung parenchyma. 2. Diffuse bronchial wall thickening with mild centrilobular and paraseptal emphysema; imaging findings suggestive of underlying COPD. 3. Aortic atherosclerosis, in addition  to left main and 3 vessel coronary artery disease. 4. There are calcifications of the aortic valve and mitral annulus. Echocardiographic correlation for evaluation of potential valvular dysfunction may be warranted if clinically indicated. 5. Mild cardiomegaly. Aortic Atherosclerosis (ICD10-I70.0) and  Emphysema (ICD10-J43.9). Electronically Signed   By: Vinnie Langton M.D.   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  Most recent echocardiogram October 2021 showed normal LV function, mild left ventricular hypertrophy, grade 1 diastolic dysfunction, moderate pulmonary hypertension, mild right atrial enlargement, mild mitral regurgitation. High resolution chest CT showed emphysema and probable UIP.   Assessment & Plan    1 acute on chronic diastolic congestive heart failure-I/O-710; wt 83 kg.  Patient's dyspnea is felt to be multifactorial. He has significant lung disease with emphysema and UIP. There may also be a superimposed component of acute on chronic diastolic congestive heart failure. We will continue Lasix 40 mg IV daily and follow renal function. Note previous echocardiogram showed normal LV function. Finally anemia is worse compared to previous and may also be contributing.    2 UIP-management per pulmonary.  3 anemia-gastroenterology evaluating.  Plan is for enteroscopy.  4 coronary artery disease-aspirin and Plavix on hold for enteroscopy per gastroenterology.  Last PCI was August 2017.  Would resume aspirin 81 mg daily when okay with GI.  Can continue off of Plavix.  Continue statin.    For questions or updates, please contact [place] Please consult [email or web address] for contact info under        Signed, Kirk Ruths, MD  09/15/2020, 9:17 AM

## 2020-09-15 NOTE — Progress Notes (Addendum)
Daily Rounding Note  09/15/2020, 11:48 AM  LOS: 2 days   SUBJECTIVE:   Chief complaint: Anemia, FOBT+      No complaints.  His breathing is not labored.  No abdominal pain.  No nausea or vomiting.  Tolerating solid food.  No BMs.  OBJECTIVE:         Vital signs in last 24 hours:    Temp:  [97.4 F (36.3 C)-98.6 F (37 C)] 98.6 F (37 C) (12/28 0423) Pulse Rate:  [58-70] 70 (12/28 0926) Resp:  [16-21] 19 (12/28 0926) BP: (111-121)/(59-64) 114/64 (12/28 0926) SpO2:  [82 %-100 %] 88 % (12/28 0926) Weight:  [83 kg] 83 kg (12/28 0500) Last BM Date: 09/13/20 Filed Weights   09/13/20 0547 09/14/20 0423 09/15/20 0500  Weight: 78.9 kg 82.7 kg 83 kg   General: Pleasant, frail, elderly, comfortable.  Nontoxic Heart: RRR Chest: No labored breathing or cough Abdomen: Soft without tenderness.  Active bowel sounds.  No distention Extremities: Slight nonpitting bil pedal edema Neuro/Psych: Pleasant, fluid speech, oriented x3.  No tremors.  Has difficulty moving his right leg a problem that dates back to craniotomy/ ICH in 1980  Intake/Output from previous day: 12/27 0701 - 12/28 0700 In: 240 [P.O.:240] Out: 950 [Urine:950]  Intake/Output this shift: Total I/O In: 120 [P.O.:120] Out: 150 [Urine:150]  Lab Results: Recent Labs    09/13/20 0337 09/14/20 0214 09/15/20 0407  WBC 9.6 11.8* 9.4  HGB 7.3* 8.1* 8.0*  HCT 23.8* 25.0* 25.1*  PLT 263 263 268   BMET Recent Labs    09/13/20 0337 09/14/20 0214 09/15/20 0407  NA 140 139 136  K 3.6 3.5 3.7  CL 103 102 99  CO2 _0 GLUCOSE 104* 167* 121*  BUN 42* 39* 35*  CREATININE 0.88 1.05 1.06  CALCIUM 8.6* 8.7* 8.5*   LFT Recent Labs    09/13/20 0337 09/14/20 0214 09/15/20 0407  PROT 5.7* 5.6* 5.6*  ALBUMIN 3.2* 3.0* 3.0*  AST 102* 83* 61*  ALT 109* 115* 94*  ALKPHOS 134* 144* 153*  BILITOT 0.8 0.8 0.7   PT/INR Recent Labs    09/14/20 0214  09/15/20 0407  LABPROT 16.3* 16.2*  INR 1.4* 1.4*   Hepatitis Panel No results for input(s): HEPBSAG, HCVAB, HEPAIGM, HEPBIGM in the last 72 hours.  Studies/Results: CT ABDOMEN PELVIS W CONTRAST  Result Date: 09/14/2020 CLINICAL DATA:  Anemia prostate cancer EXAM: CT ABDOMEN AND PELVIS WITH CONTRAST TECHNIQUE: Multidetector CT imaging of the abdomen and pelvis was performed using the standard protocol following bolus administration of intravenous contrast. CONTRAST:  166m OMNIPAQUE IOHEXOL 300 MG/ML  SOLN COMPARISON:  CT 09/24/2009, MRI 04/24/2012 FINDINGS: Lower chest: Moderate right and small left pleural effusion. Cardiomegaly with partially visualized intracardiac pacing leads. Mild subpleural fibrosis. No acute consolidation. Dependent atelectasis in the lower lobes. Hepatobiliary: Small gallstones. No focal hepatic abnormality. No biliary dilatation. Pancreas: Unremarkable. No pancreatic ductal dilatation or surrounding inflammatory changes. Spleen: Normal in size without focal abnormality. Adrenals/Urinary Tract: Adrenal glands are within normal limits. Kidneys show no hydronephrosis. Cysts within the left kidney. Delayed nephrogram with poor excretion of contrast on delayed views. The urinary bladder is unremarkable Stomach/Bowel: The stomach is nonenlarged. No dilated small bowel. No acute bowel wall thickening. Negative appendix. Vascular/Lymphatic: Moderate aortic atherosclerosis. No aneurysm. No suspicious nodes. Reproductive: Post treatment changes of the prostate. Other: Mild presacral soft tissue stranding presumably due to post treatment change. Edema within  the subcutaneous soft tissues consistent with anasarca. No free air or significant free fluid. Musculoskeletal: Degenerative changes of the spine. No acute or suspicious osseous lesions are visualized. Chronic appearing deformity of the right inferior pubic ramus. IMPRESSION: 1. Post treatment changes of the prostate gland. No CT  evidence for metastatic disease within the abdomen or pelvis. 2. Moderate right and small left pleural effusions. Cardiomegaly. Generalized subcutaneous edema consistent with anasarca. 3. Cholelithiasis. 4. Delayed nephrogram, consistent with decreased renal function, recommend correlation with appropriate laboratory studies. Aortic Atherosclerosis (ICD10-I70.0). Electronically Signed   By: Donavan Foil M.D.   On: 09/14/2020 22:56   CT Chest High Resolution  Result Date: 09/14/2020 COMPARISON:  Chest CT 06/25/2020. FINDINGS: Cardiovascular: Heart size is mildly enlarged. There is no significant pericardial fluid, thickening or pericardial calcification. There is aortic atherosclerosis, as well as atherosclerosis of the great vessels of the mediastinum and the coronary arteries, including calcified atherosclerotic plaque in the left main, left anterior descending, left circumflex and right coronary arteries. Calcifications of the aortic valve and mitral annulus. Right-sided pacemaker/AICD with lead tips terminating in the right atrium and right ventricle. Mediastinum/Nodes: No pathologically enlarged mediastinal or hilar lymph nodes. Please note that accurate exclusion of hilar adenopathy is limited on noncontrast CT scans. Esophagus is unremarkable in appearance. No axillary lymphadenopathy. Lungs/Pleura: Moderate right and small left pleural effusions lying dependently with some associated passive subsegmental atelectasis in the lower lobes of the lungs bilaterally. High-resolution imaging is limited by considerable patient respiratory motion. With these limitations in mind, there are some patchy peripheral predominant areas of ground-glass attenuation, septal thickening, cylindrical bronchiectasis and peripheral bronchiolectasis in the lungs bilaterally, most evident throughout the mid to lower lungs. No frank honeycombing. Diffuse bronchial wall thickening with mild centrilobular and paraseptal emphysema.  Inspiratory and expiratory imaging is unremarkable. No acute consolidative airspace disease. No definite suspicious appearing pulmonary nodules or masses are noted. Upper Abdomen: Aortic atherosclerosis. Musculoskeletal: There are no aggressive appearing lytic or blastic lesions noted in the visualized portions of the skeleton. IMPRESSION: 1. The appearance of the lungs is compatible with interstitial lung disease, with a spectrum of findings considered probable usual interstitial pneumonia (UIP) per current ATS guidelines. Repeat high-resolution chest CT is recommended in 12 months to assess for temporal changes in the appearance of the lung parenchyma. 2. Diffuse bronchial wall thickening with mild centrilobular and paraseptal emphysema; imaging findings suggestive of underlying COPD. 3. Aortic atherosclerosis, in addition to left main and 3 vessel coronary artery disease. 4. There are calcifications of the aortic valve and mitral annulus. Echocardiographic correlation for evaluation of potential valvular dysfunction may be warranted if clinically indicated. 5. Mild cardiomegaly. Aortic Atherosclerosis (ICD10-I70.0) and Emphysema (ICD10-J43.9). Electronically Signed   By: Vinnie Langton M.D.   On: 09/14/2020 13:13    ASSESMENT:   *    FOBT +, macrocytic anemia.  *     Elevated LFTs (Alk Phos >> transaminases, normal t bili).  Improved.   Uncomplicated gallstones but liver, biliary tree wnl per CT.    *    Hx of prostate and bladder cancer. CTAP w contrast shows no evidence for metastatic disease.  There is generalized subq edema/anasarca.  Uncomplicated cholelithiasis.  Decreased renal function evidenced by delayed nephrogram.  Aortic atherosclerosis.  *   Chronic Plavix.  DES placed 2017.  Last dose 12/24   PLAN   *  Enteroscopy tomorrow.  D/w pt and he is agreeable to proceed.    *  Hold po iron until 12/30.  Iron interferes w endscopic optics.      Azucena Freed  09/15/2020, 11:48  AM Phone [phone]    Attending physician's note   I have taken an interval history, reviewed the chart and examined the patient. I agree with the Advanced Practitioner's note, impression and recommendations.   I saw him earlier today.  He was having breakfast. No active bleeding. Breathing much better Hb has improved to 8.0 after blood transfusion. LFTs improved. CT AP neg  Plan: -Trend CBC. -Proceed with enteroscopy tomorrow after Plavix washout. OK with cardiology -If neg will insert capsule endoscope (VCE) at the same time. -Would continue Protonix for now.   Carmell Austria, MD [facility] [phone]

## 2020-09-15 NOTE — Progress Notes (Signed)
[facility] that [facility] pt. Bed available on Thursday 12/30. CSW notified pt daughter Bonita Quin.

## 2020-09-15 NOTE — Evaluation (Signed)
Clinical/Bedside Swallow Evaluation Patient Details  Name: Joshua [name] MRN: [id number] Date of Birth: September 21, 1931  Today's Date: 09/15/2020 Time: SLP Start Time (ACUTE ONLY): [id number] SLP Stop Time (ACUTE ONLY): 0941 SLP Time Calculation (min) (ACUTE ONLY): 15 min  Past Medical History:  Past Medical History:  Diagnosis Date  . 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 Arthritis   . Bilateral lower extremity edema   . CAD (coronary artery disease) cardiologist--  dr Daneen Schick   a. 04/27/2016 PCI with DES to RCA with 50% ostial to 60% segmental mid to distal left main, and 90% thrombus filled ostial to proximal circumflex, with distal right coronary filled by collaterals from left-to-right  . COPD with emphysema ([place])   . Dyspnea on exertion   . ED (erectile dysfunction)   . First degree AV block   . GERD (gastroesophageal reflux disease)   . History of adenomatous polyp of colon    09-22-2008  . History of bladder cancer    s/p  resection bladder tumor 04-19-2016  non-invasive low-grade urothelial carcinoma  . History of prostate cancer urologist-  dr Jeffie Pollock-- last PSA 0.02 (summer 2017)   dx 02/ 2013-- Stage T2a, Gleason 7, PSA 1.58--  s/p  radioactive prostate seed implants 01-19-2012  . History of ST elevation myocardial infarction (STEMI)    04-27-2016  . Hyperlipidemia   . Hypertension   . Macular degeneration   . Presence of permanent cardiac pacemaker   . S/P drug eluting coronary stent placement    04-27-2016   . Second degree Mobitz I AV block    occasional w/ first degree heart block  per cardiologist note by dr Daneen Schick  . Seizures (Joshua [name]) per pt son -- no seizure's since 1980   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  arteriorvenous cerebral malformation s/p  craniotomy 1980  . Vitamin D deficiency    Past Surgical History:  Past Surgical History:  Procedure Laterality Date  . APPENDECTOMY  2011  .  BLEPHAROPLASTY Bilateral revision 02-17-2016  . CARDIAC CATHETERIZATION N/A 04/27/2016   Procedure: Left Heart Cath and Coronary Angiography;  Surgeon: Belva Crome, MD;  Location: [facility];  Service: Cardiovascular;  Laterality: N/A;  acute inferior wall STEMI ;  ostCx to midCx 90%,  ost1st Mrg to 1st Mrg 50%,  mid to distal RCA 100%,  pRCA 80%,  LVEF 45-50% w/ inferobasal akinesis  . CARDIAC CATHETERIZATION N/A 04/27/2016   Procedure: Temporary Pacemaker;  Surgeon: Belva Crome, MD;  Location: [place] [facility];  Service: Cardiovascular;  Laterality: N/A;  mobitz 2  second degree HB w/ heart rate 29bpm  . CARDIAC CATHETERIZATION N/A 04/27/2016   Procedure: Coronary Stent Intervention;  Surgeon: Belva Crome, MD;  Location: [facility];  Service: Cardiovascular;  Laterality: N/A; DES x1 to  Proximal RCA;  DES x1 to Mid RCA  . CATARACT EXTRACTION W/ INTRAOCULAR LENS  IMPLANT, BILATERAL  2016  approx.  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 CYSTOSCOPY  01/19/2012   Procedure: CYSTOSCOPY;  Surgeon: Malka So, MD;  Location: [facility];  Service: Urology;;  no seeds found in bladder  . CYSTOSCOPY W/ RETROGRADES Bilateral 04/19/2016   Procedure: CYSTOSCOPY WITH BILATERAL RETROGRADE PYELOGRAM TRANSURETHRAL RESECTION OF BLADDER TUMOR ;  Surgeon: [name], MD;  Location: [facility];  Service: Urology;  Laterality: Bilateral;  .  CYSTOSCOPY WITH BIOPSY N/A 11/10/2016   Procedure: CYSTOSCOPY WITH BIOPSY AND FULGURATION;  Surgeon: [name], MD;  Location[facility];  Service: Urology;  Laterality: N/A;  . INSERT / REPLACE / REMOVE PACEMAKER  06/15/2017  . LEAD EXTRACTION N/A 01/22/2018   Procedure: LEAD EXTRACTION;  Surgeon: Evans Lance, MD;  Location: [place] [facility];  Service: Cardiovascular;  Laterality: N/A;  . PACEMAKER IMPLANT N/A 06/15/2017   Procedure: Pacemaker Implant;  Surgeon: [name],  MD;  Location: [facility];  Service: Cardiovascular;  Laterality: N/A;  . PACEMAKER IMPLANT N/A 01/25/2018   Procedure: PACEMAKER IMPLANT;  Surgeon: Evans Lance, MD;  Location: [place] [facility];  Service: Cardiovascular;  Laterality: N/A;  . PPM GENERATOR REMOVAL N/A 01/22/2018   Procedure: PPM GENERATOR REMOVAL;  Surgeon: Evans Lance, MD;  Location: [place] [facility];  Service: Cardiovascular;  Laterality: N/A;  . RADIOACTIVE SEED IMPLANT  01/19/2012   Procedure: RADIOACTIVE SEED IMPLANT;  Surgeon: Malka So, MD;  Location: [facility];  Service: Urology;  Laterality: N/A;  68 seeds implanted  . TRANSTHORACIC ECHOCARDIOGRAM  06/02/2016   ef 60-65%/  trivial MR/  mild TR   HPI:  Pt is an 86 yo male admitted with worsening dyspnea. CT Chest consistent with ILD but without acute consolidative airspace disease. Most recent MBS in 06/2020 revealed silent aspiration of thin liquids due to impaired timing and laryngeal vestibule closure. This was eliminated with use of a chin tuck OR modifying liquids to nectar thick consistency.  PMH includes:  intracerebral AV malformation with a history of intracerebral bleed and seizure treated with craniotomy in 1980, ILD with chronic resp failure on 3-4 L O2, osteoarthritis, lower extremity edema, bladder cancer, CAD, COPD, Mobitz 1 arrhythmia, pacemaker placement GERD, hyperlipidemia, hypertension, macular degeneration, vitamin D deficiency   Assessment / Plan / Recommendation Clinical Impression  Pt has good recall of previous MBS (in 2017 and 2021) and recommendation for chin tuck, but he says that he will occasionally forget to implement it "on the first drink." SLP reinforced education about results including silent aspiration making it important to be very consistent with the chin tuck. SLP offered repeat MBS but pt politely declined. Also offered a change to nectar thick liquids, which were consumed without aspiration on most  recent MBS without having to use a chin tuck, but he declines this as well. Recommend maintaining current diet with SLP f/u for reinforcement of strategies. Will consider repeat MBS this admission if pt changes his mind. SLP Visit Diagnosis: Dysphagia, pharyngeal phase (R13.13)    Aspiration Risk  Mild aspiration risk;Moderate aspiration risk    Diet Recommendation Regular;Thin liquid   Liquid Administration via: Straw Medication Administration: Whole meds with puree Supervision: Patient able to self feed;Intermittent supervision to cue for compensatory strategies Compensations: Slow rate;Small sips/bites;Chin tuck;Use straw to facilitate chin tuck Postural Changes: Seated upright at 90 degrees    Other  Recommendations Oral Care Recommendations: Oral care BID   Follow up Recommendations Skilled Nursing facility      Frequency and Duration min 2x/week  2 weeks       Prognosis Prognosis for Safe Diet Advancement: Good      Swallow Study   General HPI: Pt is an 86 yo male admitted with worsening dyspnea. CT Chest consistent with ILD but without acute consolidative airspace disease. Most recent MBS in 06/2020 revealed silent aspiration of thin liquids due to impaired timing and  laryngeal vestibule closure. This was eliminated with use of a chin tuck OR modifying liquids to nectar thick consistency.  PMH includes:  intracerebral AV malformation with a history of intracerebral bleed and seizure treated with craniotomy in 1980, ILD with chronic resp failure on 3-4 L O2, osteoarthritis, lower extremity edema, bladder cancer, CAD, COPD, Mobitz 1 arrhythmia, pacemaker placement GERD, hyperlipidemia, hypertension, macular degeneration, vitamin D deficiency Type of Study: Bedside Swallow Evaluation Previous Swallow Assessment: see HPI Diet Prior to this Study: Regular;Thin liquids Temperature Spikes Noted: No Respiratory Status: Nasal cannula History of Recent Intubation:  No Behavior/Cognition: Alert;Cooperative;Pleasant mood Oral Cavity Assessment: Within Functional Limits Oral Care Completed by SLP: No Vision: Functional for self-feeding Self-Feeding Abilities: Able to feed self Patient Positioning: Upright in bed Baseline Vocal Quality: Normal    Oral/Motor/Sensory Function     Ice Chips Ice chips: Not tested   Thin Liquid Thin Liquid: Within functional limits Presentation: Self Fed;Straw    Nectar Thick Nectar Thick Liquid: Not tested   Honey Thick Honey Thick Liquid: Not tested   Puree Puree: Not tested   Solid     Solid: Impaired Presentation: Self Fed Oral Phase Impairments: Other (comment) (prolonged mastication)      Mahala Menghini., M.A. CCC-SLP Acute Rehabilitation Services Pager [phone] Office [phone]  09/15/2020,9:50 AM

## 2020-09-15 NOTE — Progress Notes (Addendum)
PROGRESS NOTE    Joshua [name]   [id number]  DOB: [birth year]  DOA: 09/12/2020 PCP: Chevis Pretty, FNP   Brief Narrative:  Joshua [name] 86 y.o.malewith medical history significant ofintracerebral AV malformation with a history of intracerebral bleed and seizure treated with craniotomy in 1980, ILD with chronic resp failure on 3-4 L O2, osteoarthritis, lower extremity edema, bladder cancer, CAD, COPD, Mobitz 1 arrhythmia, pacemaker placement GERD, hyperlipidemia, hypertension, macular degeneration, vitamin D deficiency who is coming to the emergencydepartmentdue to progressively worse dyspneafor the past 2 days and an episode of dark tarry looking stools. He takes a daily iron supplement, but states that usually the stools do not look as dark. He denies abdominal pain, nausea, emesis, diarrhea, constipation or BRBPR. He mentions that he has been taking Aleve nearly daily for the past year.  Subjective: Continues to be short of breath when getting up the the chair. Not much improvement.     Assessment & Plan:   Principal Problem:   Acute on chronic diastolic congestive heart failure (mild) Acute on chronic respiratory failure- ILD - last ECHO 10/21> EF 50-55% and grade 1 d CHF - repeating ECHO - repeat CXR on 12/26 reveals improving infiltrates and effusions - on Spironolactone, IV Lasix, Lisinopril -12/28 >  son would like cardiology and pulmonary consults today-  Have called - 12/29 > continue to diurese today   Active Problems:   Chronic respiratory failure with ILD - on 3-4 L O2 as outpatient - office visit 07/21/20> Dr Vaughan Browner ordered workup for ILD and planned for High res CT in Jan -  high resolution CT suggestive of interstitial lung disease- son would like pulm eval which I have ordered - Evaluated by Dr Erskine Emery who recommends a GI eval (ongoing) and chin-tuck method to prevent issues with aspiration- the patient was recommended nectar thick liquids but he  has declined this - I have discussed palliative care with patient and son today and he has severe dyspnea on exertion (component of generalized weakness, ILD, pulm edema) and is barely able to make it to the chair without getting short of breah    Heme positive stool with possible Melena- h/o GERD - Hb dropped to about 7.1 on 12/25 but has risen to 8.1 w/o transfusion - Iron levels low normal- he takes daily Iron at home - EGD in 2/20 revealed gastritis - GI consulted- plan for enteroscopy tomorrow  Seizure disorder secondary to intracranial bleed from AV malformation (s/p craniotomy) - cont Dilantin, Phenobarbital    Macrocytic anemia - anemia panel on 12/25 revealed normal folate and B12 levels    CAD, h/o STEMI s/p PCI and 2 DES stents to RCA in 2017 -Plavix and Aspirin on hold due to being hemoccult + and c/o black stools    Functional quadriplegia  - previously ambulating with a cane- unable to ambulate now for at least 1 wk- -  PT  has recommended SNF and family is pursuing this- the patient states he would like to go home but his son states that he does not have the care he needs at home  H/o Prostate and bladder cancers - s/p radioactive seed implant  Pacemaker  Time spent in minutes:  50 min- extensive conversations with patient and then with son DVT prophylaxis: SCDs Start: 09/12/20 0211 Code Status: stable Family Communication: Son, [name] Disposition Plan: plan for d/c to SNF with palliative care Status is: Inpatient  Remains inpatient appropriate because:Ongoing diagnostic testing needed not appropriate for outpatient  work up   [employer]: The patient is from: Home              Anticipated d/c is to: SNF              Anticipated d/c date is: 2 days              Patient currently is not medically stable to d/c.  Consultants:   GI  PCCM  Cardiology  Palliative care Procedures:   none Antimicrobials:  Anti-infectives (From admission, onward)   None      Objective: Vitals:   09/15/20 0423 09/15/20 0500 09/15/20 0813 09/15/20 0926  BP: 114/62  115/62 114/64  Pulse: 67  65 70  Resp: 18  20 19   Temp: 98.6 F (37 C)     TempSrc: Oral     SpO2: 90%  100% (!) 88%  Weight:  83 kg    Height:        Intake/Output Summary (Last 24 hours) at 09/15/2020 1133 Last data filed at 09/15/2020 0826 Gross per 24 hour  Intake 240 ml  Output 500 ml  Net -260 ml   Filed Weights   09/13/20 0547 09/14/20 0423 09/15/20 0500  Weight: 78.9 kg 82.7 kg 83 kg    Examination: General exam: Appears comfortable  HEENT: PERRLA, oral mucosa moist, no sclera icterus or thrush Respiratory system: Coarse crackles at bases. Respiratory effort normal. Cardiovascular system: S1 & S2 heard,  No murmurs  Gastrointestinal system: Abdomen soft, non-tender, nondistended. Normal bowel sounds   Central nervous system: Alert and oriented. No focal neurological deficits. Extremities: No cyanosis, clubbing or edema Skin: No rashes or ulcers Psychiatry:  Mood & affect appropriate.    Data Reviewed: I have personally reviewed following labs and imaging studies  CBC: Recent Labs  Lab 09/12/20 0015 09/12/20 0644 09/12/20 0957 09/13/20 0337 09/14/20 0214 09/15/20 0407  WBC 13.2*  --  10.7* 9.6 11.8* 9.4  NEUTROABS 9.8*  --   --   --   --   --   HGB 8.3* 7.1* 7.2* 7.3* 8.1* 8.0*  HCT 26.9*  --  24.9* 23.8* 25.0* 25.1*  MCV 105.5*  --  112.2* 104.4* 102.0* 100.8*  PLT 318  --  267 263 263 [id number]   Basic Metabolic Panel: Recent Labs  Lab 09/12/20 0015 09/13/20 0337 09/14/20 0214 09/15/20 0407  NA 137 140 139 136  K 4.2 3.6 3.5 3.7  CL 102 103 102 99  CO2 25 26 26 26   GLUCOSE 181* 104* 167* 121*  BUN 43* 42* 39* 35*  CREATININE 0.98 0.88 1.05 1.06  CALCIUM 9.1 8.6* 8.7* 8.5*  MG 2.4 2.2 2.0 2.0   GFR: Estimated Creatinine Clearance: 54.9 mL/min (by C-G formula based on SCr of 1.06 mg/dL). Liver Function Tests: Recent Labs  Lab 09/12/20 0644  09/13/20 0337 09/14/20 0214 09/15/20 0407  AST 66* 102* 83* 61*  ALT 72* 109* 115* 94*  ALKPHOS 130* 134* 144* 153*  BILITOT 0.7 0.8 0.8 0.7  PROT 6.0* 5.7* 5.6* 5.6*  ALBUMIN 3.4* 3.2* 3.0* 3.0*   No results for input(s): LIPASE, AMYLASE in the last 168 hours. No results for input(s): AMMONIA in the last 168 hours. Coagulation Profile: Recent Labs  Lab 09/12/20 0015 09/13/20 0337 09/14/20 0214 09/15/20 0407  INR 1.3* 1.3* 1.4* 1.4*   Cardiac Enzymes: No results for input(s): CKTOTAL, CKMB, CKMBINDEX, TROPONINI in the last 168 hours. BNP (last 3 results) No results for input(s): PROBNP in  the last 8760 hours. HbA1C: No results for input(s): HGBA1C in the last 72 hours. CBG: No results for input(s): GLUCAP in the last 168 hours. Lipid Profile: No results for input(s): CHOL, HDL, LDLCALC, TRIG, CHOLHDL, LDLDIRECT in the last 72 hours. Thyroid Function Tests: No results for input(s): TSH, T4TOTAL, FREET4, T3FREE, THYROIDAB in the last 72 hours. Anemia Panel: No results for input(s): VITAMINB12, FOLATE, FERRITIN, TIBC, IRON, RETICCTPCT in the last 72 hours. Urine analysis:    Component Value Date/Time   COLORURINE YELLOW 06/01/2016 1347   APPEARANCEUR CLOUDY (A) 06/01/2016 1347   APPEARANCEUR Clear 01/20/2016 0959   LABSPEC 1.026 06/01/2016 1347   PHURINE 7.0 06/01/2016 1347   GLUCOSEU NEGATIVE 06/01/2016 1347   HGBUR NEGATIVE 06/01/2016 1347   BILIRUBINUR NEGATIVE 06/01/2016 1347   BILIRUBINUR Negative 01/20/2016 0959   KETONESUR NEGATIVE 06/01/2016 1347   PROTEINUR NEGATIVE 06/01/2016 1347   UROBILINOGEN negative 09/10/2014 1233   UROBILINOGEN 0.2 09/23/2009 2141   NITRITE NEGATIVE 06/01/2016 1347   LEUKOCYTESUR TRACE (A) 06/01/2016 1347   LEUKOCYTESUR Negative 01/20/2016 0959   Sepsis Labs: @LABRCNTIP (procalcitonin:4,lacticidven:4) ) Recent Results (from the past 240 hour(s))  Resp Panel by RT-PCR (Flu A&B, Covid) Nasopharyngeal Swab     Status: None    Collection Time: 09/12/20 12:14 AM   Specimen: Nasopharyngeal Swab; Nasopharyngeal(NP) swabs in vial transport medium  Result Value Ref Range Status   SARS Coronavirus 2 by RT PCR NEGATIVE NEGATIVE Final    Comment: (NOTE) SARS-CoV-2 target nucleic acids are NOT DETECTED.  The SARS-CoV-2 RNA is generally detectable in upper respiratory specimens during the acute phase of infection. The lowest concentration of SARS-CoV-2 viral copies this assay can detect is 138 copies/mL. A negative result does not preclude SARS-Cov-2 infection and should not be used as the sole basis for treatment or other patient management decisions. A negative result may occur with  improper specimen collection/handling, submission of specimen other than nasopharyngeal swab, presence of viral mutation(s) within the areas targeted by this assay, and inadequate number of viral copies(<138 copies/mL). A negative result must be combined with clinical observations, patient history, and epidemiological information. The expected result is Negative.  Fact Sheet for Patients:  09/14/20  Fact Sheet for Healthcare Providers:  BloggerCourse.com  This test is no t yet approved or cleared by the [employer] FDA and  has been authorized for detection and/or diagnosis of SARS-CoV-2 by FDA under an Emergency Use Authorization (EUA). This EUA will remain  in effect (meaning this test can be used) for the duration of the COVID-19 declaration under Section 564(b)(1) of the Act, 21 U.S.C.section 360bbb-3(b)(1), unless the authorization is terminated  or revoked sooner.       Influenza A by PCR NEGATIVE NEGATIVE Final   Influenza B by PCR NEGATIVE NEGATIVE Final    Comment: (NOTE) The Xpert Xpress SARS-CoV-2/FLU/RSV plus assay is intended as an aid in the diagnosis of influenza from Nasopharyngeal swab specimens and should not be used as a sole basis for treatment.  Nasal washings and aspirates are unacceptable for Xpert Xpress SARS-CoV-2/FLU/RSV testing.  Fact Sheet for Patients: [place]  Fact Sheet for Healthcare Providers: BloggerCourse.com  This test is not yet approved or cleared by the [employer]ious[employer].[place] FDA and has been authorized for detection and/or diagnosis of SARS-CoV-2 by FDA under an Emergency Use Authorization (EUA). This EUA will remain in effect (meaning this test can be used) for the duration of the COVID-19 declaration under Section 564(b)(1) of the Act, 21 U.S.C. section 360bbb-3(b)(1),  unless the authorization is terminated or revoked.  Performed at [facility], [address] [place], [place] [address]   MRSA PCR Screening     Status: None   Collection Time: 09/12/20  4:15 PM   Specimen: Nasal Mucosa; Nasopharyngeal  Result Value Ref Range Status   MRSA by PCR NEGATIVE NEGATIVE Final    Comment:        The GeneXpert MRSA Assay (FDA approved for NASAL specimens only), is one component of a comprehensive MRSA colonization surveillance program. It is not intended to diagnose MRSA infection nor to guide or monitor treatment for MRSA infections. Performed at [facility], [address] [place], [place] [address]          Radiology Studies: CT ABDOMEN PELVIS W CONTRAST  Result Date: 09/14/2020 CLINICAL DATA:  Anemia prostate cancer EXAM: CT ABDOMEN AND PELVIS WITH CONTRAST TECHNIQUE: Multidetector CT imaging of the abdomen and pelvis was performed using the standard protocol following bolus administration of intravenous contrast. CONTRAST:  138mL OMNIPAQUE IOHEXOL 300 MG/ML  SOLN COMPARISON:  CT 09/24/2009, MRI 04/24/2012 FINDINGS: Lower chest: Moderate right and small left pleural effusion. Cardiomegaly with partially visualized intracardiac pacing leads. Mild subpleural fibrosis. No acute consolidation. Dependent atelectasis in the lower lobes. Hepatobiliary:  Small gallstones. No focal hepatic abnormality. No biliary dilatation. Pancreas: Unremarkable. No pancreatic ductal dilatation or surrounding inflammatory changes. Spleen: Normal in size without focal abnormality. Adrenals/Urinary Tract: Adrenal glands are within normal limits. Kidneys show no hydronephrosis. Cysts within the left kidney. Delayed nephrogram with poor excretion of contrast on delayed views. The urinary bladder is unremarkable Stomach/Bowel: The stomach is nonenlarged. No dilated small bowel. No acute bowel wall thickening. Negative appendix. Vascular/Lymphatic: Moderate aortic atherosclerosis. No aneurysm. No suspicious nodes. Reproductive: Post treatment changes of the prostate. Other: Mild presacral soft tissue stranding presumably due to post treatment change. Edema within the subcutaneous soft tissues consistent with anasarca. No free air or significant free fluid. Musculoskeletal: Degenerative changes of the spine. No acute or suspicious osseous lesions are visualized. Chronic appearing deformity of the right inferior pubic ramus. IMPRESSION: 1. Post treatment changes of the prostate gland. No CT evidence for metastatic disease within the abdomen or pelvis. 2. Moderate right and small left pleural effusions. Cardiomegaly. Generalized subcutaneous edema consistent with anasarca. 3. Cholelithiasis. 4. Delayed nephrogram, consistent with decreased renal function, recommend correlation with appropriate laboratory studies. Aortic Atherosclerosis (ICD10-I70.0). Electronically Signed   By: Donavan Foil M.D.   On: 09/14/2020 22:56   CT Chest High Resolution  Result Date: 09/14/2020 CLINICAL DATA:  86 year old male with history of respiratory failure. Worsening shortness of breath. Evaluate for interstitial lung disease. EXAM: CT CHEST WITHOUT CONTRAST TECHNIQUE: Multidetector CT imaging of the chest was performed following the standard protocol without intravenous contrast. High resolution imaging  of the lungs, as well as inspiratory and expiratory imaging, was performed. COMPARISON:  Chest CT 06/25/2020. FINDINGS: Cardiovascular: Heart size is mildly enlarged. There is no significant pericardial fluid, thickening or pericardial calcification. There is aortic atherosclerosis, as well as atherosclerosis of the great vessels of the mediastinum and the coronary arteries, including calcified atherosclerotic plaque in the left main, left anterior descending, left circumflex and right coronary arteries. Calcifications of the aortic valve and mitral annulus. Right-sided pacemaker/AICD with lead tips terminating in the right atrium and right ventricle. Mediastinum/Nodes: No pathologically enlarged mediastinal or hilar lymph nodes. Please note that accurate exclusion of hilar adenopathy is limited on noncontrast CT scans. Esophagus is unremarkable in appearance. No  axillary lymphadenopathy. Lungs/Pleura: Moderate right and small left pleural effusions lying dependently with some associated passive subsegmental atelectasis in the lower lobes of the lungs bilaterally. High-resolution imaging is limited by considerable patient respiratory motion. With these limitations in mind, there are some patchy peripheral predominant areas of ground-glass attenuation, septal thickening, cylindrical bronchiectasis and peripheral bronchiolectasis in the lungs bilaterally, most evident throughout the mid to lower lungs. No frank honeycombing. Diffuse bronchial wall thickening with mild centrilobular and paraseptal emphysema. Inspiratory and expiratory imaging is unremarkable. No acute consolidative airspace disease. No definite suspicious appearing pulmonary nodules or masses are noted. Upper Abdomen: Aortic atherosclerosis. Musculoskeletal: There are no aggressive appearing lytic or blastic lesions noted in the visualized portions of the skeleton. IMPRESSION: 1. The appearance of the lungs is compatible with interstitial lung disease,  with a spectrum of findings considered probable usual interstitial pneumonia (UIP) per current ATS guidelines. Repeat high-resolution chest CT is recommended in 12 months to assess for temporal changes in the appearance of the lung parenchyma. 2. Diffuse bronchial wall thickening with mild centrilobular and paraseptal emphysema; imaging findings suggestive of underlying COPD. 3. Aortic atherosclerosis, in addition to left main and 3 vessel coronary artery disease. 4. There are calcifications of the aortic valve and mitral annulus. Echocardiographic correlation for evaluation of potential valvular dysfunction may be warranted if clinically indicated. 5. Mild cardiomegaly. Aortic Atherosclerosis (ICD10-I70.0) and Emphysema (ICD10-J43.9). Electronically Signed   By: Vinnie Langton M.D.   On: 09/14/2020 13:13      Scheduled Meds: . atorvastatin  20 mg Oral QPM  . calcium carbonate  500 mg of elemental calcium Oral Q breakfast  . cholecalciferol  2,000 Units Oral Daily  . citalopram  20 mg Oral Daily  . ferrous sulfate  325 mg Oral BID WC  . furosemide  40 mg Intravenous Daily  . multivitamin  1 tablet Oral BID  . pantoprazole (PROTONIX) IV  40 mg Intravenous Q12H  . PHENobarbital  64.8 mg Oral BID  . phenytoin  100 mg Oral TID PC & HS  . potassium chloride  20 mEq Oral TID   Continuous Infusions: . ferric gluconate (FERRLECIT/NULECIT) IV       LOS: 2 days      Debbe Odea, MD [facility] Pager: [email or web address] 09/15/2020, 11:33 AM

## 2020-09-15 NOTE — H&P (View-Only) (Signed)
        Daily Rounding Note  09/15/2020, 11:48 AM  LOS: 2 days   SUBJECTIVE:   Chief complaint: Anemia, FOBT+      No complaints.  His breathing is not labored.  No abdominal pain.  No nausea or vomiting.  Tolerating solid food.  No BMs.  OBJECTIVE:         Vital signs in last 24 hours:    Temp:  [97.4 F (36.3 C)-98.6 F (37 C)] 98.6 F (37 C) (12/28 0423) Pulse Rate:  [58-70] 70 (12/28 0926) Resp:  [16-21] 19 (12/28 0926) BP: (111-121)/(59-64) 114/64 (12/28 0926) SpO2:  [82 %-100 %] 88 % (12/28 0926) Weight:  [83 kg] 83 kg (12/28 0500) Last BM Date: 09/13/20 Filed Weights   09/13/20 0547 09/14/20 0423 09/15/20 0500  Weight: 78.9 kg 82.7 kg 83 kg   General: Pleasant, frail, elderly, comfortable.  Nontoxic Heart: RRR Chest: No labored breathing or cough Abdomen: Soft without tenderness.  Active bowel sounds.  No distention Extremities: Slight nonpitting bil pedal edema Neuro/Psych: Pleasant, fluid speech, oriented x3.  No tremors.  Has difficulty moving his right leg a problem that dates back to craniotomy/ ICH in 1980  Intake/Output from previous day: 12/27 0701 - 12/28 0700 In: 240 [P.O.:240] Out: 950 [Urine:950]  Intake/Output this shift: Total I/O In: 120 [P.O.:120] Out: 150 [Urine:150]  Lab Results: Recent Labs    09/13/20 0337 09/14/20 0214 09/15/20 0407  WBC 9.6 11.8* 9.4  HGB 7.3* 8.1* 8.0*  HCT 23.8* 25.0* 25.1*  PLT 263 263 268   BMET Recent Labs    09/13/20 0337 09/14/20 0214 09/15/20 0407  NA 140 139 136  K 3.6 3.5 3.7  CL 103 102 99  CO2 26 26 26  GLUCOSE 104* 167* 121*  BUN 42* 39* 35*  CREATININE 0.88 1.05 1.06  CALCIUM 8.6* 8.7* 8.5*   LFT Recent Labs    09/13/20 0337 09/14/20 0214 09/15/20 0407  PROT 5.7* 5.6* 5.6*  ALBUMIN 3.2* 3.0* 3.0*  AST 102* 83* 61*  ALT 109* 115* 94*  ALKPHOS 134* 144* 153*  BILITOT 0.8 0.8 0.7   PT/INR Recent Labs    09/14/20 0214  09/15/20 0407  LABPROT 16.3* 16.2*  INR 1.4* 1.4*   Hepatitis Panel No results for input(s): HEPBSAG, HCVAB, HEPAIGM, HEPBIGM in the last 72 hours.  Studies/Results: CT ABDOMEN PELVIS W CONTRAST  Result Date: 09/14/2020 CLINICAL DATA:  Anemia prostate cancer EXAM: CT ABDOMEN AND PELVIS WITH CONTRAST TECHNIQUE: Multidetector CT imaging of the abdomen and pelvis was performed using the standard protocol following bolus administration of intravenous contrast. CONTRAST:  100mL OMNIPAQUE IOHEXOL 300 MG/ML  SOLN COMPARISON:  CT 09/24/2009, MRI 04/24/2012 FINDINGS: Lower chest: Moderate right and small left pleural effusion. Cardiomegaly with partially visualized intracardiac pacing leads. Mild subpleural fibrosis. No acute consolidation. Dependent atelectasis in the lower lobes. Hepatobiliary: Small gallstones. No focal hepatic abnormality. No biliary dilatation. Pancreas: Unremarkable. No pancreatic ductal dilatation or surrounding inflammatory changes. Spleen: Normal in size without focal abnormality. Adrenals/Urinary Tract: Adrenal glands are within normal limits. Kidneys show no hydronephrosis. Cysts within the left kidney. Delayed nephrogram with poor excretion of contrast on delayed views. The urinary bladder is unremarkable Stomach/Bowel: The stomach is nonenlarged. No dilated small bowel. No acute bowel wall thickening. Negative appendix. Vascular/Lymphatic: Moderate aortic atherosclerosis. No aneurysm. No suspicious nodes. Reproductive: Post treatment changes of the prostate. Other: Mild presacral soft tissue stranding presumably due to post treatment change. Edema within   the subcutaneous soft tissues consistent with anasarca. No free air or significant free fluid. Musculoskeletal: Degenerative changes of the spine. No acute or suspicious osseous lesions are visualized. Chronic appearing deformity of the right inferior pubic ramus. IMPRESSION: 1. Post treatment changes of the prostate gland. No CT  evidence for metastatic disease within the abdomen or pelvis. 2. Moderate right and small left pleural effusions. Cardiomegaly. Generalized subcutaneous edema consistent with anasarca. 3. Cholelithiasis. 4. Delayed nephrogram, consistent with decreased renal function, recommend correlation with appropriate laboratory studies. Aortic Atherosclerosis (ICD10-I70.0). Electronically Signed   By: Kim  Fujinaga M.D.   On: 09/14/2020 22:56   CT Chest High Resolution  Result Date: 09/14/2020 COMPARISON:  Chest CT 06/25/2020. FINDINGS: Cardiovascular: Heart size is mildly enlarged. There is no significant pericardial fluid, thickening or pericardial calcification. There is aortic atherosclerosis, as well as atherosclerosis of the great vessels of the mediastinum and the coronary arteries, including calcified atherosclerotic plaque in the left main, left anterior descending, left circumflex and right coronary arteries. Calcifications of the aortic valve and mitral annulus. Right-sided pacemaker/AICD with lead tips terminating in the right atrium and right ventricle. Mediastinum/Nodes: No pathologically enlarged mediastinal or hilar lymph nodes. Please note that accurate exclusion of hilar adenopathy is limited on noncontrast CT scans. Esophagus is unremarkable in appearance. No axillary lymphadenopathy. Lungs/Pleura: Moderate right and small left pleural effusions lying dependently with some associated passive subsegmental atelectasis in the lower lobes of the lungs bilaterally. High-resolution imaging is limited by considerable patient respiratory motion. With these limitations in mind, there are some patchy peripheral predominant areas of ground-glass attenuation, septal thickening, cylindrical bronchiectasis and peripheral bronchiolectasis in the lungs bilaterally, most evident throughout the mid to lower lungs. No frank honeycombing. Diffuse bronchial wall thickening with mild centrilobular and paraseptal emphysema.  Inspiratory and expiratory imaging is unremarkable. No acute consolidative airspace disease. No definite suspicious appearing pulmonary nodules or masses are noted. Upper Abdomen: Aortic atherosclerosis. Musculoskeletal: There are no aggressive appearing lytic or blastic lesions noted in the visualized portions of the skeleton. IMPRESSION: 1. The appearance of the lungs is compatible with interstitial lung disease, with a spectrum of findings considered probable usual interstitial pneumonia (UIP) per current ATS guidelines. Repeat high-resolution chest CT is recommended in 12 months to assess for temporal changes in the appearance of the lung parenchyma. 2. Diffuse bronchial wall thickening with mild centrilobular and paraseptal emphysema; imaging findings suggestive of underlying COPD. 3. Aortic atherosclerosis, in addition to left main and 3 vessel coronary artery disease. 4. There are calcifications of the aortic valve and mitral annulus. Echocardiographic correlation for evaluation of potential valvular dysfunction may be warranted if clinically indicated. 5. Mild cardiomegaly. Aortic Atherosclerosis (ICD10-I70.0) and Emphysema (ICD10-J43.9). Electronically Signed   By: Daniel  Entrikin M.D.   On: 09/14/2020 13:13    ASSESMENT:   *    FOBT +, macrocytic anemia.  *     Elevated LFTs (Alk Phos >> transaminases, normal t bili).  Improved.   Uncomplicated gallstones but liver, biliary tree wnl per CT.    *    Hx of prostate and bladder cancer. CTAP w contrast shows no evidence for metastatic disease.  There is generalized subq edema/anasarca.  Uncomplicated cholelithiasis.  Decreased renal function evidenced by delayed nephrogram.  Aortic atherosclerosis.  *   Chronic Plavix.  DES placed 2017.  Last dose 12/24   PLAN   *  Enteroscopy tomorrow.  D/w pt and he is agreeable to proceed.    *     Hold po iron until 12/30.  Iron interferes w endscopic optics.      Azucena Freed  09/15/2020, 11:48  AM Phone [phone]    Attending physician's note   I have taken an interval history, reviewed the chart and examined the patient. I agree with the Advanced Practitioner's note, impression and recommendations.   I saw him earlier today.  He was having breakfast. No active bleeding. Breathing much better Hb has improved to 8.0 after blood transfusion. LFTs improved. CT AP neg  Plan: -Trend CBC. -Proceed with enteroscopy tomorrow after Plavix washout. OK with cardiology -If neg will insert capsule endoscope (VCE) at the same time. -Would continue Protonix for now.   Carmell Austria, MD [facility] [phone]

## 2020-09-16 ENCOUNTER — Inpatient Hospital Stay (HOSPITAL_COMMUNITY): Payer: Medicare Other | Admitting: Anesthesiology

## 2020-09-16 ENCOUNTER — Encounter (HOSPITAL_COMMUNITY)
Admission: EM | Disposition: A | Discharge status: Skilled Nursing Facility | Payer: Self-pay | Source: Home / Self Care | Attending: Internal Medicine

## 2020-09-16 ENCOUNTER — Telehealth: Payer: Medicare Other | Admitting: Nurse Practitioner

## 2020-09-16 ENCOUNTER — Encounter (HOSPITAL_COMMUNITY): Payer: Self-pay | Admitting: Family Medicine

## 2020-09-16 ENCOUNTER — Telehealth: Payer: Self-pay

## 2020-09-16 DIAGNOSIS — J9611 Chronic respiratory failure with hypoxia: Secondary | ICD-10-CM

## 2020-09-16 DIAGNOSIS — Z7189 Other specified counseling: Secondary | ICD-10-CM

## 2020-09-16 DIAGNOSIS — I5023 Acute on chronic systolic (congestive) heart failure: Secondary | ICD-10-CM

## 2020-09-16 DIAGNOSIS — I10 Essential (primary) hypertension: Secondary | ICD-10-CM | POA: Diagnosis not present

## 2020-09-16 DIAGNOSIS — K31819 Angiodysplasia of stomach and duodenum without bleeding: Secondary | ICD-10-CM

## 2020-09-16 DIAGNOSIS — Z515 Encounter for palliative care: Secondary | ICD-10-CM

## 2020-09-16 DIAGNOSIS — K922 Gastrointestinal hemorrhage, unspecified: Secondary | ICD-10-CM

## 2020-09-16 DIAGNOSIS — I5033 Acute on chronic diastolic (congestive) heart failure: Secondary | ICD-10-CM | POA: Diagnosis not present

## 2020-09-16 HISTORY — PX: ENTEROSCOPY: SHX5533

## 2020-09-16 HISTORY — PX: GIVENS CAPSULE STUDY: SHX5432

## 2020-09-16 HISTORY — PX: HOT HEMOSTASIS: SHX5433

## 2020-09-16 HISTORY — PX: SUBMUCOSAL TATTOO INJECTION: SHX6856

## 2020-09-16 LAB — COMPREHENSIVE METABOLIC PANEL
ALT: 74 U/L — ABNORMAL HIGH (ref 0–44)
AST: 42 U/L — ABNORMAL HIGH (ref 15–41)
Albumin: 2.8 g/dL — ABNORMAL LOW (ref 3.5–5.0)
Alkaline Phosphatase: 139 U/L — ABNORMAL HIGH (ref 38–126)
Anion gap: 9 (ref 5–15)
BUN: 30 mg/dL — ABNORMAL HIGH (ref 8–23)
CO2: 27 mmol/L (ref 22–32)
Calcium: 8.6 mg/dL — ABNORMAL LOW (ref 8.9–10.3)
Chloride: 102 mmol/L (ref 98–111)
Creatinine, Ser: 1.02 mg/dL (ref 0.61–1.24)
GFR, Estimated: >60 mL/min (ref >60)
Glucose, Bld: 99 mg/dL (ref 70–99)
Potassium: 3.4 mmol/L — ABNORMAL LOW (ref 3.5–5.1)
Sodium: 138 mmol/L (ref 135–145)
Total Bilirubin: 0.5 mg/dL (ref 0.3–1.2)
Total Protein: 5.4 g/dL — ABNORMAL LOW (ref 6.5–8.1)

## 2020-09-16 LAB — CBC
HCT: 25.3 % — ABNORMAL LOW (ref 39.0–52.0)
Hemoglobin: 7.8 g/dL — ABNORMAL LOW (ref 13.0–17.0)
MCH: 31.5 pg (ref 26.0–34.0)
MCHC: 30.8 g/dL (ref 30.0–36.0)
MCV: 102 fL — ABNORMAL HIGH (ref 80.0–100.0)
Platelets: 280 10*3/uL (ref 150–400)
RBC: 2.48 MIL/uL — ABNORMAL LOW (ref 4.22–5.81)
RDW: 20.5 % — ABNORMAL HIGH (ref 11.5–15.5)
WBC: 8.5 10*3/uL (ref 4.0–10.5)
nRBC: 0.9 % — ABNORMAL HIGH (ref 0.0–0.2)

## 2020-09-16 LAB — MAGNESIUM: Magnesium: 1.9 mg/dL (ref 1.7–2.4)

## 2020-09-16 SURGERY — ENTEROSCOPY
Anesthesia: Monitor Anesthesia Care

## 2020-09-16 MED ORDER — GLUCAGON HCL RDNA (DIAGNOSTIC) 1 MG IJ SOLR
INTRAMUSCULAR | Status: AC
  Filled 2020-09-16: qty 1

## 2020-09-16 MED ORDER — POTASSIUM CHLORIDE 20 MEQ PO PACK
40.0000 meq | PACK | Freq: Two times a day (BID) | ORAL | Status: AC
  Administered 2020-09-16: 22:00:00 40 meq via ORAL
  Filled 2020-09-16: qty 2

## 2020-09-16 MED ORDER — SPOT INK MARKER SYRINGE KIT
PACK | SUBMUCOSAL | Status: DC | PRN
Start: 2020-09-16 — End: 2020-09-16
  Administered 2020-09-16: 2.5 mL via SUBMUCOSAL

## 2020-09-16 MED ORDER — GLUCAGON HCL RDNA (DIAGNOSTIC) 1 MG IJ SOLR
INTRAMUSCULAR | Status: DC | PRN
  Administered 2020-09-16: .25 mg via INTRAVENOUS

## 2020-09-16 MED ORDER — POTASSIUM CHLORIDE CRYS ER 20 MEQ PO TBCR
40.0000 meq | EXTENDED_RELEASE_TABLET | Freq: Once | ORAL | Status: DC
  Administered 2020-09-16: 13:00:00 40 meq via ORAL
  Filled 2020-09-16: qty 2

## 2020-09-16 MED ORDER — SPOT INK MARKER SYRINGE KIT
PACK | SUBMUCOSAL | Status: AC
  Filled 2020-09-16: qty 5

## 2020-09-16 MED ORDER — LIDOCAINE 2% (20 MG/ML) 5 ML SYRINGE
INTRAMUSCULAR | Status: DC | PRN
  Administered 2020-09-16: 80 mg via INTRAVENOUS

## 2020-09-16 MED ORDER — PHENYLEPHRINE 40 MCG/ML (10ML) SYRINGE FOR IV PUSH (FOR BLOOD PRESSURE SUPPORT)
PREFILLED_SYRINGE | INTRAVENOUS | Status: DC | PRN
  Administered 2020-09-16 (×2): 120 ug via INTRAVENOUS

## 2020-09-16 MED ORDER — LACTATED RINGERS IV SOLN: INTRAVENOUS | Status: DC | PRN

## 2020-09-16 MED ORDER — POTASSIUM CHLORIDE CRYS ER 20 MEQ PO TBCR: 20.0000 meq | EXTENDED_RELEASE_TABLET | Freq: Two times a day (BID) | ORAL | Status: DC

## 2020-09-16 MED ORDER — PHENYLEPHRINE HCL-NACL 10-0.9 MG/250ML-% IV SOLN
INTRAVENOUS | Status: DC | PRN
  Administered 2020-09-16: 80 ug/min via INTRAVENOUS

## 2020-09-16 MED ORDER — PROPOFOL 500 MG/50ML IV EMUL
INTRAVENOUS | Status: DC | PRN
  Administered 2020-09-16: 120 ug/kg/min via INTRAVENOUS

## 2020-09-16 NOTE — Progress Notes (Signed)
Daily Progress Note   Patient Name: Joshua [name]       Date: 09/16/2020 DOB: Jun 11, 1932  Age: 86 y.o. MRN#: [id number] Attending Physician: Aileen Fass, Tammi Klippel, MD Primary Care Physician: Chevis Pretty, FNP Admit Date: 09/12/2020  Reason for Consultation/Follow-up: Establishing goals of care  Subjective: Breathing feels better, no chest pain, sleepy following endoscopy  Length of Stay: 3  Current Medications: Scheduled Meds:   atorvastatin  20 mg Oral QPM   calcium carbonate  500 mg of elemental calcium Oral Q breakfast   cholecalciferol  2,000 Units Oral Daily   citalopram  20 mg Oral Daily   [START ON 09/17/2020] ferrous sulfate  325 mg Oral BID WC   furosemide  40 mg Intravenous Daily   multivitamin  1 tablet Oral BID   pantoprazole (PROTONIX) IV  40 mg Intravenous Q12H   PHENobarbital  64.8 mg Oral BID   phenytoin  100 mg Oral TID PC & HS   potassium chloride  40 mEq Oral BID    Continuous Infusions:   PRN Meds: acetaminophen **OR** acetaminophen, albuterol, camphor-menthol, Muscle Rub, ondansetron **OR** ondansetron (ZOFRAN) IV, polyvinyl alcohol  Physical Exam Constitutional:      General: He is not in acute distress. Pulmonary:     Effort: Pulmonary effort is normal.     Comments: Remains on 4L Musculoskeletal:     Right lower leg: No edema.     Left lower leg: No edema.  Skin:    General: Skin is warm and dry.  Neurological:     Mental Status: He is alert and oriented to person, place, and time.             Vital Signs: BP (!) 110/50 (BP Location: Right Arm)    Pulse (!) 59    Temp 97.8 F (36.6 C) (Oral)    Resp 18    Ht 6' (1.829 m)    Wt 80.9 kg    SpO2 95%    BMI 24.19 kg/m  SpO2: SpO2: 95 % O2 Device: O2 Device: Nasal Cannula O2 Flow Rate:  O2 Flow Rate (L/min): 3 L/min  Intake/output summary:   Intake/Output Summary (Last 24 hours) at 09/16/2020 1529 Last data filed at 09/16/2020 0500 Gross per 24 hour  Intake 340 ml  Output 900 ml  Net -560 ml   LBM: Last BM Date: 09/13/20 Baseline Weight: Weight: 77.1 kg Most recent weight: Weight: 80.9 kg       Palliative Assessment/Data: PPS 50%      Patient Active Problem List   Diagnosis Date Noted   SOB (shortness of breath)    Chronic pulmonary edema    Melena    Acute heart failure ([place]) 09/13/2020   Acute on chronic diastolic congestive heart failure ([place]) 09/12/2020   Macrocytic anemia 09/12/2020   Functional quadriplegia ([place]) 09/12/2020   CAD (coronary artery disease)    Heme positive stool    Anemia    Chronic respiratory failure with hypoxia ([facility])    Right lower lobe pneumonia 06/22/2020   CHF exacerbation ([place]) 06/22/2020   Elevated troponin 06/22/2020   Chronic diastolic heart failure ([place]) 11/25/2019   High risk medication use 07/19/2019   Pseudophakia,  both eyes 09/20/2018   Exudative age-related macular degeneration ([place]) 09/20/2018   Second degree AV block 06/15/2017   Aortic atherosclerosis ([place]) 06/03/2016   General weakness    Old MI (myocardial infarction) 04/27/2016   Bladder cancer ([place]) 04/18/2016   Vitamin D deficiency 10/15/2015   Prostate cancer ([place])    Hypercholesterolemia    Essential hypertension    Seizures ([place])    GERD 08/22/2008   COLONIC POLYPS, ADENOMATOUS, HX OF 08/22/2008    Palliative Care Assessment & Plan   HPI: Per intake H&P on 09/12/20 By Dr. Olevia Bowens --> "Joshua [name] a 86 y.o.malewith medical history significant ofintracerebral AV malformation with a history of intracerebral bleed and seizure treated with craniotomy in 1980, osteoarthritis, lower extremity edema, bladder cancer, CAD, COPD, Mobitz 1 arrhythmia, pacemaker placement GERD, hyperlipidemia, hypertension, macular  degeneration, vitamin D deficiency who is coming to the emergencydepartmentdue to progressively worse dyspneafor the past 2 days and an episode of dark tarry looking stools yesterday. He takes a daily iron supplement, but states that usually the stools do not look as dark. He denies abdominal pain, nausea, emesis, diarrhea, constipation or BRBPR. He mentions that he has been taking Aleve nearly daily for the past year. No fever, chills, but positive generalized weakness. No wheezing or hemoptysis. No chest pain, palpitations, diaphoresis, dysuria, frequency or hematuria. No polyuria, polydipsia, polyphagia or blurred vision."  Assessment: Received call from patient's son [name] requesting follow up call.  Went to see patient - he is just returning from endoscopy but feels okay - breathing is better and no chest pain. He is feeling sleepy following endoscopy and not very talkative. He is agreeable to me calling his son back.  Spoke with [name] - [name] shares concerns that patient will be unable to participate in rehab. He also wants to know patient's prognosis. He shares concern that patient will not improve.   We discuss what rehab entails and that it is certainly possible that patient will not be able to participate in rehab d/t his lung and heart condition and overall frailty. We discuss a plan of trying rehab, see how patient does, but also have palliative follow outpatient to assist if patient does not tolerate rehab well. We discuss an alternative of avoiding aggressive therapies, avoiding rehab, and focusing on patient's quality of life and staying at home. Discuss that patient may be hospice eligible d/t his lung disease.   We also discuss patient's chronic diseases and his decline in functional status. We discuss that while patient is not actively dying he is certainly at risk for acute decline. We discuss that we can attempt to manage his chronic diseases but they cannot be "fixed". He  expresses understanding.   Son seems to lean towards idea of trying rehab with palliative to follow; although does tell me he is going to discuss the above with his sister and follow up with me later.   Recommendations/Plan:  Education provided with son - discussed rehab vs home options - probably to rehab but son to call back   Code Status:  Full code  Prognosis:   Unable to determine  Discharge Planning:  [facility] for rehab with Palliative care service follow-up  Care plan was discussed with patient's son  Thank you for allowing the Palliative Medicine Team to assist in the care of this patient.   Total Time 40 minutes Prolonged Time Billed  no       Greater than 50%  of this time was  spent counseling and coordinating care related to the above assessment and plan.  Gerlean Ren, DNP, [facility] Palliative Medicine Team Team Phone # [phone]  Pager [phone]

## 2020-09-16 NOTE — Op Note (Signed)
[facility] Patient Name: Joshua [name] Procedure Date : 09/16/2020 MRN: [id number] Attending MD: Jackquline Denmark , MD Date of Birth: 1931-10-29 CSN: [id number] Age: 85 Admit Type: Inpatient Procedure:                Small bowel enteroscopy Indications:              Obscure GI bleeding source not documented by                            previous EGD and colonoscopy Providers:                Jackquline Denmark, MD, Clyde Lundborg, RN, Cherylynn Ridges,                            Technician, Laureen Abrahams, Lerry Paterson, CRNA Referring MD:              Medicines:                Monitored Anesthesia Care Complications:            No immediate complications. Estimated Blood Loss:     Estimated blood loss: none. Procedure:                Pre-Anesthesia Assessment:                           - Prior to the procedure, a History and Physical                            was performed, and patient medications and                            allergies were reviewed. The patient's tolerance of                            previous anesthesia was also reviewed. The risks                            and benefits of the procedure and the sedation                            options and risks were discussed with the patient.                            All questions were answered, and informed consent                            was obtained. Prior Anticoagulants: The patient has                            taken Plavix (clopidogrel), last dose was 5 days                            prior to procedure. ASA Grade Assessment: III - A  patient with severe systemic disease. After                            reviewing the risks and benefits, the patient was                            deemed in satisfactory condition to undergo the                            procedure.                           After obtaining informed consent, the endoscope was                            passed under direct  vision. Throughout the                            procedure, the patient's blood pressure, pulse, and                            oxygen saturations were monitored continuously. The                            [id number] Olympus pediatric colonoscope                            was introduced through the mouth and advanced to                            the proximal jejunum. The small bowel enteroscopy                            was accomplished without difficulty. The patient                            tolerated the procedure well. Scope In: Scope Out: Findings:      The examined esophagus was normal with well-defined Z-line at 35 cm.      The entire examined stomach was normal. No bleeding. Hypertrophied       Brunner's glands in the first portion of the duodenum (normal variant)      A single small angiodysplastic lesion with no bleeding was found in the       third portion of the duodenum. Coagulation for hemostasis using argon       plasma at 1 liter/minute and 20 watts was successful.      There was no evidence of significant pathology in the proximal jejunum.      Most distal reach was tattooed using 2 cc of [place] ink (SPOT). Glucagon       was given on withdrawal of the scope.      The capsule was dropped in the second portion of the duodenum for VCE       EGD scope. Impression:               - A single non-bleeding angiodysplastic lesion in  the duodenum. Treated with argon plasma coagulation                            (APC).                           - The examined portion of the jejunum was normal.                           - No specimens collected. Recommendation:           - Return patient to hospital ward for ongoing care.                           - Resume Plavix from tomorrow onwards.                           - Will wait for capsule endoscopy                           - Monitor CBC periodically. Transfuse as needed.                            - Advance diet.                           - Discussed with patient's son. Procedure Code(s):        --- Professional ---                           [phone], Small intestinal endoscopy, enteroscopy                            beyond second portion of duodenum, not including                            ileum; with control of bleeding (eg, injection,                            bipolar cautery, unipolar cautery, laser, heater                            probe, stapler, plasma coagulator) Diagnosis Code(s):        --- Professional ---                           K74.259, Angiodysplasia of stomach and duodenum                            without bleeding                           K92.2, Gastrointestinal hemorrhage, unspecified CPT copyright 2019 [employer]. All rights reserved. The codes documented in this report are preliminary and upon coder review may  be revised to meet current compliance requirements. Lynann Bologna, MD 09/16/2020 9:14:34 AM This report has been signed electronically. Number of  Addenda: 0

## 2020-09-16 NOTE — Progress Notes (Signed)
[facility] PROGRESS NOTE    Progress Note  Joshua [name]  ACZ:[id number] DOB: 06-Nov-1931 DOA: 09/12/2020 PCP: Bennie Pierini, FNP     Brief Narrative:   Joshua [name] is an 86 y.o. male past medical history significant for intracerebral AV malformation with a history of a bleed seizure disorder treated with craniotomy 19 ED, IADL and chronic respiratory failure 3 to 4 L, bladder cancer Mobitz type I, pacemaker placement, GERD comes into the ED with progressive worsening dyspnea for the past 2-day an episode of black dark tarry stools, but he is on iron supplements at home.  Assessment/Plan:   Acute on chronic diastolic congestive heart failure (HCC) 2D echo in October showed an EF of 50%, chest x-ray revealed improved infiltrates and effusion he is on IV Lasix Aldactone and lisinopril. Cardiology felt there may be a component for UIP plus or minus low hemoglobin which may be contributing to his dyspnea. Continue diuresis.  He is negative about 1-1/2 L,. He is mildly hypokalemic will replete.  Acute on chronic respiratory failure with hypoxia with ILD: Multifactorial in the setting of anemia, mild pulmonary edema and chronic aspiration pneumonia (he is not following treatment took and thin liquids recommendations) looks like he refused nectar thick liquid and repeated MBBS At baseline 3 to 4 L high resolution CT was suggestive of interstitial lung disease, evaluated by Dr. Myrla Halsted recommended GI evaluation and chin tuck method to prevent aspiration. Patient was recommended nectar thick liquids but has declined this.  Heme positive stools/possible melena: Hemoglobin dropped to 7.1, he status post 1 unit of packed red blood cells with a reasonable rise in his hemoglobin to 8.1. EGD on 11/09/2019 showed gastritis, GI was reconsulted on 09/14/2020 recommend small bowel enteroscopy showed A single non-bleeding angiodysplastic lesion in the duodenum. Treated with argon plasma, the  jejunum examined was normal. Resume Plavix tomorrow morning. Await results of the capsule endoscopy.  Seizure disorder secondary to intracranial bleed, plan continue Dilantin Cont Dilantin And phenobarbital  Microcytic anemia: Normal B12 and folate levels.  History of CAD/STEMI with 2 DES stent placed to the RCA in 2017: Aspirin and Plavix which were held due to lower GI bleed and melanotic stools.  Functional quadriplegia: PT evaluated the patient recommended skilled nursing facility family is pursuing.  Patient would like to go home. He was previously ambulating with a cane unable to do so for the last week.    DVT prophylaxis: SCD Family Communication:son Status is: Inpatient  Remains inpatient appropriate because:Hemodynamically unstable   Dispo: The patient is from: Home              Anticipated d/c is to: SNF              Anticipated d/c date is: 3 days              Patient currently is not medically stable to d/c.        Code Status:     Code Status Orders  (From admission, onward)         Start     Ordered   09/12/20 0211  Full code  Continuous        09/12/20 0219        Code Status History    Date Active Date Inactive Code Status Order ID Comments User Context   06/22/2020 2003 06/26/2020 2326 Full Code [id number]  John Giovanni, MD ED   01/22/2018 1845 01/26/2018 1705 Full Code [id number]  Marinus Maw,  MD Inpatient   06/15/2017 1834 06/16/2017 1510 Full Code [id number]  [name], MD Inpatient   06/01/2016 1701 06/04/2016 1928 Full Code [id number]  Geradine Girt, DO Inpatient   04/27/2016 1405 05/10/2016 1631 Full Code [id number]  Belva Crome, MD Inpatient   Advance Care Planning Activity    Advance Directive Documentation   Flowsheet Row Most Recent Value  Type of Advance Directive Healthcare Power of [place], Living will  Pre-existing out of facility DNR order (yellow form or pink MOST form) --  "MOST" Form in Place? --        IV  Access:    Peripheral IV   Procedures and diagnostic studies:   CT ABDOMEN PELVIS W CONTRAST  Result Date: 09/14/2020 CLINICAL DATA:  Anemia prostate cancer EXAM: CT ABDOMEN AND PELVIS WITH CONTRAST TECHNIQUE: Multidetector CT imaging of the abdomen and pelvis was performed using the standard protocol following bolus administration of intravenous contrast. CONTRAST:  184mL OMNIPAQUE IOHEXOL 300 MG/ML  SOLN COMPARISON:  CT 09/24/2009, MRI 04/24/2012 FINDINGS: Lower chest: Moderate right and small left pleural effusion. Cardiomegaly with partially visualized intracardiac pacing leads. Mild subpleural fibrosis. No acute consolidation. Dependent atelectasis in the lower lobes. Hepatobiliary: Small gallstones. No focal hepatic abnormality. No biliary dilatation. Pancreas: Unremarkable. No pancreatic ductal dilatation or surrounding inflammatory changes. Spleen: Normal in size without focal abnormality. Adrenals/Urinary Tract: Adrenal glands are within normal limits. Kidneys show no hydronephrosis. Cysts within the left kidney. Delayed nephrogram with poor excretion of contrast on delayed views. The urinary bladder is unremarkable Stomach/Bowel: The stomach is nonenlarged. No dilated small bowel. No acute bowel wall thickening. Negative appendix. Vascular/Lymphatic: Moderate aortic atherosclerosis. No aneurysm. No suspicious nodes. Reproductive: Post treatment changes of the prostate. Other: Mild presacral soft tissue stranding presumably due to post treatment change. Edema within the subcutaneous soft tissues consistent with anasarca. No free air or significant free fluid. Musculoskeletal: Degenerative changes of the spine. No acute or suspicious osseous lesions are visualized. Chronic appearing deformity of the right inferior pubic ramus. IMPRESSION: 1. Post treatment changes of the prostate gland. No CT evidence for metastatic disease within the abdomen or pelvis. 2. Moderate right and small left pleural  effusions. Cardiomegaly. Generalized subcutaneous edema consistent with anasarca. 3. Cholelithiasis. 4. Delayed nephrogram, consistent with decreased renal function, recommend correlation with appropriate laboratory studies. Aortic Atherosclerosis (ICD10-I70.0). Electronically Signed   By: Donavan Foil M.D.   On: 09/14/2020 22:56     Medical Consultants:    None.  Anti-Infectives:   none  Subjective:    [name] a very talkative this morning sleepy.  Objective:    Vitals:   09/16/20 0921 09/16/20 0930 09/16/20 0955 09/16/20 1135  BP: (!) 119/41 (!) 131/54 131/68 (!) 110/50  Pulse: (!) 59 63 61 (!) 59  Resp: 20 (!) 24 18 18   Temp:   (!) 97.5 F (36.4 C) 97.8 F (36.6 C)  TempSrc:   Oral Oral  SpO2: 97% 97% 100% 95%  Weight: 80.9 kg     Height: 6' (1.829 m)      SpO2: 95 % O2 Flow Rate (L/min): 3 L/min FiO2 (%): 21 %   Intake/Output Summary (Last 24 hours) at 09/16/2020 1241 Last data filed at 09/16/2020 0500 Gross per 24 hour  Intake 580 ml  Output 900 ml  Net -320 ml   Filed Weights   09/16/20 0326 09/16/20 0718 09/16/20 0921  Weight: 80.9 kg 80.9 kg 80.9 kg    Exam: General exam: In no  acute distress. Respiratory system: Good air movement and clear to auscultation. Cardiovascular system: S1 & S2 heard, RRR. No JVD. Gastrointestinal system: Abdomen is nondistended, soft and nontender.  Extremities: No pedal edema. Skin: No rashes, lesions or ulcers  Data Reviewed:    Labs: Basic Metabolic Panel: Recent Labs  Lab 09/12/20 0015 09/13/20 0337 09/14/20 0214 09/15/20 0407 09/16/20 0355  NA 137 140 139 136 138  K 4.2 3.6 3.5 3.7 3.4*  CL 102 103 102 99 102  CO2 25 26 26 26 27   GLUCOSE 181* 104* 167* 121* 99  BUN 43* 42* 39* 35* 30*  CREATININE 0.98 0.88 1.05 1.06 1.02  CALCIUM 9.1 8.6* 8.7* 8.5* 8.6*  MG 2.4 2.2 2.0 2.0 1.9   GFR Estimated Creatinine Clearance: 57.1 mL/min (by C-G formula based on SCr of 1.02 mg/dL). Liver Function  Tests: Recent Labs  Lab 09/12/20 0644 09/13/20 0337 09/14/20 0214 09/15/20 0407 09/16/20 0355  AST 66* 102* 83* 61* 42*  ALT 72* 109* 115* 94* 74*  ALKPHOS 130* 134* 144* 153* 139*  BILITOT 0.7 0.8 0.8 0.7 0.5  PROT 6.0* 5.7* 5.6* 5.6* 5.4*  ALBUMIN 3.4* 3.2* 3.0* 3.0* 2.8*   No results for input(s): LIPASE, AMYLASE in the last 168 hours. No results for input(s): AMMONIA in the last 168 hours. Coagulation profile Recent Labs  Lab 09/12/20 0015 09/13/20 0337 09/14/20 0214 09/15/20 0407  INR 1.3* 1.3* 1.4* 1.4*   COVID-19 Labs  No results for input(s): DDIMER, FERRITIN, LDH, CRP in the last 72 hours.  Lab Results  Component Value Date   SARSCOV2NAA NEGATIVE 09/12/2020   [place] NEGATIVE 06/22/2020    CBC: Recent Labs  Lab 09/12/20 0015 09/12/20 0644 09/12/20 0957 09/13/20 0337 09/14/20 0214 09/15/20 0407 09/16/20 0355  WBC 13.2*  --  10.7* 9.6 11.8* 9.4 8.5  NEUTROABS 9.8*  --   --   --   --   --   --   HGB 8.3*   < > 7.2* 7.3* 8.1* 8.0* 7.8*  HCT 26.9*  --  24.9* 23.8* 25.0* 25.1* 25.3*  MCV 105.5*  --  112.2* 104.4* 102.0* 100.8* 102.0*  PLT 318  --  267 263 263 268 280   < > = values in this interval not displayed.   Cardiac Enzymes: No results for input(s): CKTOTAL, CKMB, CKMBINDEX, TROPONINI in the last 168 hours. BNP (last 3 results) No results for input(s): PROBNP in the last 8760 hours. CBG: No results for input(s): GLUCAP in the last 168 hours. D-Dimer: No results for input(s): DDIMER in the last 72 hours. Hgb A1c: No results for input(s): HGBA1C in the last 72 hours. Lipid Profile: No results for input(s): CHOL, HDL, LDLCALC, TRIG, CHOLHDL, LDLDIRECT in the last 72 hours. Thyroid function studies: No results for input(s): TSH, T4TOTAL, T3FREE, THYROIDAB in the last 72 hours.  Invalid input(s): FREET3 Anemia work up: No results for input(s): VITAMINB12, FOLATE, FERRITIN, TIBC, IRON, RETICCTPCT in the last 72 hours. Sepsis  Labs: Recent Labs  Lab 09/13/20 0337 09/14/20 0214 09/15/20 0407 09/16/20 0355  WBC 9.6 11.8* 9.4 8.5   Microbiology Recent Results (from the past 240 hour(s))  Resp Panel by RT-PCR (Flu A&B, Covid) Nasopharyngeal Swab     Status: None   Collection Time: 09/12/20 12:14 AM   Specimen: Nasopharyngeal Swab; Nasopharyngeal(NP) swabs in vial transport medium  Result Value Ref Range Status   SARS Coronavirus 2 by RT PCR NEGATIVE NEGATIVE Final    Comment: (NOTE) SARS-CoV-2 target  nucleic acids are NOT DETECTED.  The SARS-CoV-2 RNA is generally detectable in upper respiratory specimens during the acute phase of infection. The lowest concentration of SARS-CoV-2 viral copies this assay can detect is 138 copies/mL. A negative result does not preclude SARS-Cov-2 infection and should not be used as the sole basis for treatment or other patient management decisions. A negative result may occur with  improper specimen collection/handling, submission of specimen other than nasopharyngeal swab, presence of viral mutation(s) within the areas targeted by this assay, and inadequate number of viral copies(<138 copies/mL). A negative result must be combined with clinical observations, patient history, and epidemiological information. The expected result is Negative.  Fact Sheet for Patients:  EntrepreneurPulse.com.au  Fact Sheet for Healthcare Providers:  IncredibleEmployment.be  This test is no t yet approved or cleared by the [place] FDA and  has been authorized for detection and/or diagnosis of SARS-CoV-2 by FDA under an Emergency Use Authorization (EUA). This EUA will remain  in effect (meaning this test can be used) for the duration of the COVID-19 declaration under Section 564(b)(1) of the Act, 21 U.S.C.section 360bbb-3(b)(1), unless the authorization is terminated  or revoked sooner.       Influenza A by PCR NEGATIVE NEGATIVE Final    Influenza B by PCR NEGATIVE NEGATIVE Final    Comment: (NOTE) The Xpert Xpress SARS-CoV-2/FLU/RSV plus assay is intended as an aid in the diagnosis of influenza from Nasopharyngeal swab specimens and should not be used as a sole basis for treatment. Nasal washings and aspirates are unacceptable for Xpert Xpress SARS-CoV-2/FLU/RSV testing.  Fact Sheet for Patients: EntrepreneurPulse.com.au  Fact Sheet for Healthcare Providers: IncredibleEmployment.be  This test is not yet approved or cleared by the [place] FDA and has been authorized for detection and/or diagnosis of SARS-CoV-2 by FDA under an Emergency Use Authorization (EUA). This EUA will remain in effect (meaning this test can be used) for the duration of the COVID-19 declaration under Section 564(b)(1) of the Act, 21 U.S.C. section 360bbb-3(b)(1), unless the authorization is terminated or revoked.  Performed at [facility], [address], [place], [place] [address]   MRSA PCR Screening     Status: None   Collection Time: 09/12/20  4:15 PM   Specimen: Nasal Mucosa; Nasopharyngeal  Result Value Ref Range Status   MRSA by PCR NEGATIVE NEGATIVE Final    Comment:        The GeneXpert MRSA Assay (FDA approved for NASAL specimens only), is one component of a comprehensive MRSA colonization surveillance program. It is not intended to diagnose MRSA infection nor to guide or monitor treatment for MRSA infections. Performed at [facility], [address], [place], [place] [address]      Medications:   . atorvastatin  20 mg Oral QPM  . calcium carbonate  500 mg of elemental calcium Oral Q breakfast  . cholecalciferol  2,000 Units Oral Daily  . citalopram  20 mg Oral Daily  . [START ON 09/17/2020] ferrous sulfate  325 mg Oral BID WC  . furosemide  40 mg Intravenous Daily  . multivitamin  1 tablet Oral BID  . pantoprazole (PROTONIX) IV  40 mg Intravenous Q12H  . PHENobarbital  64.8  mg Oral BID  . phenytoin  100 mg Oral TID PC & HS  . potassium chloride  40 mEq Oral Once   Continuous Infusions:    LOS: 3 days   Charlynne Cousins  [facility]  09/16/2020, 12:41 PM

## 2020-09-16 NOTE — Progress Notes (Signed)
SLP Cancellation Note  Patient Details Name: Joshua [name] MRN: [id number] DOB: 1932/02/26   Cancelled treatment:       Reason Eval/Treat Not Completed: Patient at procedure or test/unavailable. Will f/u as able.   Mahala Menghini., M.A. CCC-SLP Acute Rehabilitation Services Pager [phone] Office [phone]  09/16/2020, 8:46 AM

## 2020-09-16 NOTE — Progress Notes (Signed)
NAME:  Joshua [name], MRN:  [id number], DOB:  12-Nov-1931, LOS: 3 ADMISSION DATE:  09/12/2020, CONSULTATION DATE: 09/14/20 REFERRING MD:  Dr. Wynelle Cleveland, CHIEF COMPLAINT:  SOB    Brief History:  86 y/o M admitted with progressive dyspnea.    History of Present Illness:  86 y/o M, former smoker, 3L O2 dependent since 06/2020 who presented to [facility] on 12/25 with two week hx of worsening shortness of breath.   The patient was seen by Dr. Vaughan Browner on 11/2 after admission for hypoxia (06/25/20) thought related to CAP, acute on chronic diastolic CHF and possible ILD.  The patient was treated during that admission for CAP and was diuresed.  CT at that time showed bronchiectasis with reticulation and fluid in the fissure. ECHO showed normal EF, grade I diastolic dysfunction and moderate elevation of pulmonary pressures.  He was discharged on prednisone and oxygen.  He followed up with Pulmonary on 12/2 with ANA, CCP, myositis and HSP ordered with plan to follow up CT in 1-2 months.  Of note, he was brought into the clinic visit by EMS after family called for weakness / fatigue.  ANA was negative, HSP negative and CCP normal. At the 12/2 visit, he was prescribed levaquin and prednisone for possible RLL airspace disease. Since that visit, chart review shows several tele visits with mention of increased home care needs / difficulty providing care at home. He had a virtual follow up visit on 12/10 and felt well at that time. Symptoms had resolved.   The patient reportedly takes aleve almost daily and has for the past year.  He was admitted 12/25 for increasing dyspnea.  O2 needs review shows max O2 of 4L since admit.  Primary team reports active diuresis, O2 needs at baseline. HRCT assessed which favors UIP with emphysema overlap and component of fluid. He was noted to have a significant anemia with NSAID use and occult positive stools.    PCCM consulted for evaluation.   Past Medical History:  HTN HLD  CAD  STEMI s/p  DES Chronic dCHF AV Block s/p Pacemaker  ICH - 1980 in setting of AVM, seizures after  Macular Degeneration  COPD with Emphysema  Former Smoker - quit 1974, 30 pk year hx GERD  Vitamin D Deficiency  Prostate Cancer - Stage T2a, Gleason 7 Bladder Cancer s/p resection of bladder tumor   Significant Hospital Events:  12/25 Admit  12/27 PCCM consulted   Consults:    Procedures:    Significant Diagnostic Tests:  ECHO 10/5 >> LVEF 50-55%, no RWMA, grade I diastolic dysfunction, moderately elevated pulmonary pressure CT Chest w/o 10/7 >> emphysema with diffuse mild cylindrical bronchiectasis and subpleural reticulation suggesting underlying fibrotic lung disease, apparent loculated fluid posterior major fissure on the right with subpleural rind of consolidative opacity in the deep posterior right costophrenic sulcus SLP MBSS 10/8 >> silent aspiration of thin liquids, rec's for chin tuck reg diet / thin liquids  HRCT 12/27 >> appearance of lungs compatible with interstitial lung disease, with a spectrum of findings considered probable usual interstitial pneumonia, diffuse bronchial wall thickening with mild centrilobular and paraseptal emphysema, 3 vessel CAD, calcifications of the aortic valve and mitral annulus, mild cardiomegaly   Micro Data:  COVID 12/25 >> negative  Influenza A/B 12/25 >> negative   Antimicrobials:  None   Interim History / Subjective:  Relatively unremarkable EGD this AM, capsule placed.  Patient states breathing improved today.  Objective   Blood pressure (!) 110/50, pulse Marland Kitchen)  59, temperature 97.8 F (36.6 C), temperature source Oral, resp. rate 18, height 6' (1.829 m), weight 80.9 kg, SpO2 95 %.        Intake/Output Summary (Last 24 hours) at 09/16/2020 1238 Last data filed at 09/16/2020 0500 Gross per 24 hour  Intake 580 ml  Output 900 ml  Net -320 ml   Filed Weights   09/16/20 0326 09/16/20 0718 09/16/20 0921  Weight: 80.9 kg 80.9 kg 80.9 kg     Examination: Constitutional: chronically ill man lying in bed  Eyes: eomi, pupils equal Ears, nose, mouth, and throat: MM dry, trachea midline, +HJR Cardiovascular: RRR, ext warm Respiratory: crackles at bases Gastrointestinal: soft, +BS Skin: No rashes, normal turgor Neurologic: moves all 4 ext, weak Psychiatric: RASS 0   CT personally reviewed: UIP/emphysema overlap with mild superimposed GG in cephalizing pattern as well as enlarging BL effusions c/w pulmonary edema  09/16/20 Bedside US: Left- minimal effusion, R- small effusion pictured below    Resolved Hospital Problem list     Assessment & Plan:  DOE- see discussion 12/27. Suspect related to anemia, mild pulmonary edema, decondition with weak cough, and likely ongoing aspiration from not following chin tuck with thin liquids.   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  He feels improved today  - F/u small capsule endoscopy - Agree with diuresis outlined by cardiologist - Mobility as tolerated - Continue to emphasize chin-tuck method to reduce likelihood of his disease progression (it may anyway if there is IPF component) - Given subjective improvement, will arrange close OP f/u in clinic with Dr. Isaiah Serge or one of his associates  Myrla Halsted MD PCCM

## 2020-09-16 NOTE — Anesthesia Preprocedure Evaluation (Addendum)
Anesthesia Evaluation  Patient identified by MRN, date of birth, ID band Patient awake    Reviewed: Allergy & Precautions, NPO status , Patient's Chart, lab work & pertinent test results  Airway Mallampati: II  TM Distance: >3 FB Neck [name]: Full    Dental no notable dental hx. (+) Teeth Intact, Dental Advisory Given   Pulmonary COPD, former smoker,    Pulmonary exam normal breath sounds clear to auscultation       Cardiovascular hypertension, Pt. on medications + CAD, + Past MI, + Cardiac Stents and +CHF  Normal cardiovascular exam+ dysrhythmias (Mobitz Type 2) + pacemaker  Rhythm:Regular Rate:Normal  9/17 Echo  Left ventricle: The cavity size was normal. Wall thickness was  normal. Systolic function was normal. The estimated ejection  fraction was in the range of 60% to 65%. Although no diagnostic  regional wall motion abnormality was identified, this possibility  cannot be completely excluded on the basis of this study. There  was an increased relative contribution of atrial contraction to  ventricular filling, which may be due to hypovolemia. Left  ventricular diastolic function parameters were normal. Doppler  parameters are consistent with low ventricular filling pressure.  - Mitral valve: Calcified annulus.  - Pulmonary arteries: PA peak pressure: 34 mm Hg (S).    Neuro/Psych negative psych ROS   GI/Hepatic Neg liver ROS, GERD  ,  Endo/Other  negative endocrine ROS  Renal/GU negative Renal ROS     Musculoskeletal  (+) Arthritis ,   Abdominal   Peds  Hematology  (+) Blood dyscrasia, anemia , Hgb 7.8   Anesthesia Other Findings   Reproductive/Obstetrics                            Anesthesia Physical Anesthesia Plan  ASA: IV  Anesthesia Plan: MAC   Post-op Pain Management:    Induction:   PONV Risk Score and Plan: Treatment may vary due to age or medical  condition  Airway Management Planned: Nasal Cannula and Natural Airway  Additional Equipment: None  Intra-op Plan:   Post-operative Plan:   Informed Consent: I have reviewed the patients History and Physical, chart, labs and discussed the procedure including the risks, benefits and alternatives for the proposed anesthesia with the patient or authorized representative who has indicated his/her understanding and acceptance.     Dental advisory given  Plan Discussed with: CRNA and Anesthesiologist  Anesthesia Plan Comments:        Anesthesia Quick Evaluation

## 2020-09-16 NOTE — Addendum Note (Signed)
Addendum  created 09/16/20 0354 by Trevor Iha, MD   Clinical Note Signed

## 2020-09-16 NOTE — Progress Notes (Signed)
PASSR Received [id number]. Pt's legal name is [employer].

## 2020-09-16 NOTE — Progress Notes (Signed)
Progress Note  Patient Name: Joshua [name] Date of Encounter: 09/16/2020  [facility] Cardiologist: Lyn Records III, MD   Subjective   No CP this AM; dyspnea improving  Inpatient Medications    Scheduled Meds: . [MAR Hold] atorvastatin  20 mg Oral QPM  . [MAR Hold] calcium carbonate  500 mg of elemental calcium Oral Q breakfast  . [MAR Hold] cholecalciferol  2,000 Units Oral Daily  . [MAR Hold] citalopram  20 mg Oral Daily  . [MAR Hold] ferrous sulfate  325 mg Oral BID WC  . [MAR Hold] furosemide  40 mg Intravenous Daily  . [MAR Hold] multivitamin  1 tablet Oral BID  . [MAR Hold] pantoprazole (PROTONIX) IV  40 mg Intravenous Q12H  . [MAR Hold] PHENobarbital  64.8 mg Oral BID  . [MAR Hold] phenytoin  100 mg Oral TID PC & HS   Continuous Infusions:  PRN Meds: [MAR Hold] acetaminophen **OR** [MAR Hold] acetaminophen, [MAR Hold] albuterol, [MAR Hold] camphor-menthol, [MAR Hold] Muscle Rub, [MAR Hold] ondansetron **OR** [MAR Hold] ondansetron (ZOFRAN) IV, [MAR Hold] polyvinyl alcohol   Vital Signs    Vitals:   09/16/20 0718 09/16/20 0912 09/16/20 0921 09/16/20 0930  BP: (!) 117/45 (!) 118/31 (!) 119/41 (!) 131/54  Pulse: 61 (!) 59 (!) 59 63  Resp: (!) 22 18 20  (!) 24  Temp: 98.9 F (37.2 C) 97.8 F (36.6 C)    TempSrc: Tympanic Axillary    SpO2: 96% 96% 97% 97%  Weight: 80.9 kg  80.9 kg   Height: 6' (1.829 m)  6' (1.829 m)     Intake/Output Summary (Last 24 hours) at 09/16/2020 0952 Last data filed at 09/16/2020 0500 Gross per 24 hour  Intake 580 ml  Output 1200 ml  Net -620 ml   Last 3 Weights 09/16/2020 09/16/2020 09/16/2020  Weight (lbs) 178 lb 5.6 oz 178 lb 5.6 oz 178 lb 5.6 oz  Weight (kg) 80.9 kg 80.9 kg 80.9 kg      Telemetry    AV paced - Personally Reviewed   Physical Exam   GEN: Frail; NAD Neck: supple Cardiac: RRR, no gallop Respiratory: Basilar dry crackles; diminished BS bases; no wheeze GI: Soft, NT/ND MS: No edema Neuro:  Grossly  intact Psych: Normal affect   Labs     Chemistry Recent Labs  Lab 09/14/20 0214 09/15/20 0407 09/16/20 0355  NA 139 136 138  K 3.5 3.7 3.4*  CL 102 99 102  CO2 26 26 27   GLUCOSE 167* 121* 99  BUN 39* 35* 30*  CREATININE 1.05 1.06 1.02  CALCIUM 8.7* 8.5* 8.6*  PROT 5.6* 5.6* 5.4*  ALBUMIN 3.0* 3.0* 2.8*  AST 83* 61* 42*  ALT 115* 94* 74*  ALKPHOS 144* 153* 139*  BILITOT 0.8 0.7 0.5  GFRNONAA >60 >60 >60  ANIONGAP 11 11 9      Hematology Recent Labs  Lab 09/14/20 0214 09/15/20 0407 09/16/20 0355  WBC 11.8* 9.4 8.5  RBC 2.45* 2.49* 2.48*  HGB 8.1* 8.0* 7.8*  HCT 25.0* 25.1* 25.3*  MCV 102.0* 100.8* 102.0*  MCH 33.1 32.1 31.5  MCHC 32.4 31.9 30.8  RDW 20.7* 20.5* 20.5*  PLT 263 268 280    BNP Recent Labs  Lab 09/13/20 0337 09/14/20 0214 09/15/20 0407  BNP 866.0* 849.9* 976.5*      Radiology    CT ABDOMEN PELVIS W CONTRAST  Result Date: 09/14/2020 CLINICAL DATA:  Anemia prostate cancer EXAM: CT ABDOMEN AND PELVIS WITH CONTRAST TECHNIQUE:  Multidetector CT imaging of the abdomen and pelvis was performed using the standard protocol following bolus administration of intravenous contrast. CONTRAST:  161mL OMNIPAQUE IOHEXOL 300 MG/ML  SOLN COMPARISON:  CT 09/24/2009, MRI 04/24/2012 FINDINGS: Lower chest: Moderate right and small left pleural effusion. Cardiomegaly with partially visualized intracardiac pacing leads. Mild subpleural fibrosis. No acute consolidation. Dependent atelectasis in the lower lobes. Hepatobiliary: Small gallstones. No focal hepatic abnormality. No biliary dilatation. Pancreas: Unremarkable. No pancreatic ductal dilatation or surrounding inflammatory changes. Spleen: Normal in size without focal abnormality. Adrenals/Urinary Tract: Adrenal glands are within normal limits. Kidneys show no hydronephrosis. Cysts within the left kidney. Delayed nephrogram with poor excretion of contrast on delayed views. The urinary bladder is unremarkable  Stomach/Bowel: The stomach is nonenlarged. No dilated small bowel. No acute bowel wall thickening. Negative appendix. Vascular/Lymphatic: Moderate aortic atherosclerosis. No aneurysm. No suspicious nodes. Reproductive: Post treatment changes of the prostate. Other: Mild presacral soft tissue stranding presumably due to post treatment change. Edema within the subcutaneous soft tissues consistent with anasarca. No free air or significant free fluid. Musculoskeletal: Degenerative changes of the spine. No acute or suspicious osseous lesions are visualized. Chronic appearing deformity of the right inferior pubic ramus. IMPRESSION: 1. Post treatment changes of the prostate gland. No CT evidence for metastatic disease within the abdomen or pelvis. 2. Moderate right and small left pleural effusions. Cardiomegaly. Generalized subcutaneous edema consistent with anasarca. 3. Cholelithiasis. 4. Delayed nephrogram, consistent with decreased renal function, recommend correlation with appropriate laboratory studies. Aortic Atherosclerosis (ICD10-I70.0). Electronically Signed   By: Donavan Foil M.D.   On: 09/14/2020 22:56   CT Chest High Resolution  Result Date: 09/14/2020 CLINICAL DATA:  86 year old male with history of respiratory failure. Worsening shortness of breath. Evaluate for interstitial lung disease. EXAM: CT CHEST WITHOUT CONTRAST TECHNIQUE: Multidetector CT imaging of the chest was performed following the standard protocol without intravenous contrast. High resolution imaging of the lungs, as well as inspiratory and expiratory imaging, was performed. COMPARISON:  Chest CT 06/25/2020. FINDINGS: Cardiovascular: Heart size is mildly enlarged. There is no significant pericardial fluid, thickening or pericardial calcification. There is aortic atherosclerosis, as well as atherosclerosis of the great vessels of the mediastinum and the coronary arteries, including calcified atherosclerotic plaque in the left main, left  anterior descending, left circumflex and right coronary arteries. Calcifications of the aortic valve and mitral annulus. Right-sided pacemaker/AICD with lead tips terminating in the right atrium and right ventricle. Mediastinum/Nodes: No pathologically enlarged mediastinal or hilar lymph nodes. Please note that accurate exclusion of hilar adenopathy is limited on noncontrast CT scans. Esophagus is unremarkable in appearance. No axillary lymphadenopathy. Lungs/Pleura: Moderate right and small left pleural effusions lying dependently with some associated passive subsegmental atelectasis in the lower lobes of the lungs bilaterally. High-resolution imaging is limited by considerable patient respiratory motion. With these limitations in mind, there are some patchy peripheral predominant areas of ground-glass attenuation, septal thickening, cylindrical bronchiectasis and peripheral bronchiolectasis in the lungs bilaterally, most evident throughout the mid to lower lungs. No frank honeycombing. Diffuse bronchial wall thickening with mild centrilobular and paraseptal emphysema. Inspiratory and expiratory imaging is unremarkable. No acute consolidative airspace disease. No definite suspicious appearing pulmonary nodules or masses are noted. Upper Abdomen: Aortic atherosclerosis. Musculoskeletal: There are no aggressive appearing lytic or blastic lesions noted in the visualized portions of the skeleton. IMPRESSION: 1. The appearance of the lungs is compatible with interstitial lung disease, with a spectrum of findings considered probable usual interstitial pneumonia (UIP) per current  ATS guidelines. Repeat high-resolution chest CT is recommended in 12 months to assess for temporal changes in the appearance of the lung parenchyma. 2. Diffuse bronchial wall thickening with mild centrilobular and paraseptal emphysema; imaging findings suggestive of underlying COPD. 3. Aortic atherosclerosis, in addition to left main and 3 vessel  coronary artery disease. 4. There are calcifications of the aortic valve and mitral annulus. Echocardiographic correlation for evaluation of potential valvular dysfunction may be warranted if clinically indicated. 5. Mild cardiomegaly. Aortic Atherosclerosis (ICD10-I70.0) and Emphysema (ICD10-J43.9). Electronically Signed   By: Vinnie Langton M.D.   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  Most recent echocardiogram October 2021 showed normal LV function, mild left ventricular hypertrophy, grade 1 diastolic dysfunction, moderate pulmonary hypertension, mild right atrial enlargement, mild mitral regurgitation. High resolution chest CT showed emphysema and probable UIP.   Assessment & Plan    1 acute on chronic diastolic congestive heart failure-I/O-650; wt 80.9 kg.  As outlined previously patient's dyspnea appears to be multifactorial including emphysema/UIP, superimposed chronic diastolic congestive heart failure and anemia.  Symptoms are improving.  Renal function appears to be stable.  We will continue Lasix at present dose today and likely transition to oral Lasix tomorrow.    2 UIP-management per pulmonary.  3 anemia-gastroenterology managing.  4 coronary artery disease-aspirin and Plavix on hold for enteroscopy per gastroenterology.  Last PCI was August 2017.  Would resume aspirin 81 mg daily tomorrow.  I do not think he requires resumption of Plavix.  5 hypokalemia-supplement.  For questions or updates, please contact [facility] Please consult [email or web address] for contact info under        Signed, Kirk Ruths, MD  09/16/2020, 9:52 AM

## 2020-09-16 NOTE — Progress Notes (Signed)
Givens capsule placed through scope at 0902 just beyond stomach during upper endoscopy.

## 2020-09-16 NOTE — Progress Notes (Signed)
PT Cancellation Note  Patient Details Name: Joshua [name] MRN: [id number] DOB: [birth year]   Cancelled Treatment:    Reason Eval/Treat Not Completed: Patient at procedure or test/unavailable (enteroscopy).  Lillia Pauls, PT, DPT Acute Rehabilitation Services Pager [phone] Office [phone]    Norval Morton 09/16/2020, 7:40 AM

## 2020-09-16 NOTE — Anesthesia Postprocedure Evaluation (Signed)
Anesthesia Post Note  Patient: Joshua [name]  Procedure(s) Performed: ENTEROSCOPY (N/A ) HOT HEMOSTASIS (ARGON PLASMA COAGULATION/BICAP) (N/A ) SUBMUCOSAL TATTOO INJECTION     Patient location during evaluation: Endoscopy Anesthesia Type: MAC Level of consciousness: awake and alert Pain management: pain level controlled Vital Signs Assessment: post-procedure vital signs reviewed and stable Respiratory status: spontaneous breathing, nonlabored ventilation, respiratory function stable and patient connected to nasal cannula oxygen Cardiovascular status: blood pressure returned to baseline and stable Postop Assessment: no apparent nausea or vomiting Anesthetic complications: no   No complications documented.  Last Vitals:  Vitals:   09/16/20 0912 09/16/20 0921  BP: (!) 118/31 (!) 119/41  Pulse: (!) 59 (!) 59  Resp: 18 20  Temp: 36.6 C   SpO2: 96% 97%    Last Pain:  Vitals:   09/16/20 0921  TempSrc:   PainSc: 0-No pain                 Barnet Glasgow

## 2020-09-16 NOTE — Transfer of Care (Signed)
Immediate Anesthesia Transfer of Care Note  Patient: Joshua [name]  Procedure(s) Performed: ENTEROSCOPY (N/A ) HOT HEMOSTASIS (ARGON PLASMA COAGULATION/BICAP) (N/A ) SUBMUCOSAL TATTOO INJECTION  Patient Location: Endoscopy Unit  Anesthesia Type:MAC  Level of Consciousness: drowsy  Airway & Oxygen Therapy: Patient Spontanous Breathing  Post-op Assessment: Report given to RN and Post -op Vital signs reviewed and stable  Post vital signs: Reviewed and stable  Last Vitals:  Vitals Value Taken Time  BP    Temp    Pulse 59 09/16/20 0912  Resp 18 09/16/20 0912  SpO2 96 % 09/16/20 0912  Vitals shown include unvalidated device data.  Last Pain:  Vitals:   09/16/20 0718  TempSrc: Tympanic  PainSc: 0-No pain      Patients Stated Pain Goal: 0 (71/82/09 9068)  Complications: No complications documented.

## 2020-09-16 NOTE — Interval H&P Note (Signed)
History and Physical Interval Note:  09/16/2020 8:09 AM  Joshua [name]  has presented today for surgery, with the diagnosis of FOBT + anemia.  The various methods of treatment have been discussed with the patient and family. After consideration of risks, benefits and other options for treatment, the patient has consented to  Procedure(s): ENTEROSCOPY (N/A) as a surgical intervention.  The patient's history has been reviewed, patient examined, no change in status, stable for surgery.  I have reviewed the patient's chart and labs.  Questions were answered to the patient's satisfaction.     Lynann Bologna

## 2020-09-16 NOTE — Anesthesia Postprocedure Evaluation (Signed)
Anesthesia Post Note  Patient: Joshua [name]  Procedure(s) Performed: ENTEROSCOPY (N/A ) HOT HEMOSTASIS (ARGON PLASMA COAGULATION/BICAP) (N/A ) SUBMUCOSAL TATTOO INJECTION     Anesthesia Post Evaluation No complications documented.  Last Vitals:  Vitals:   09/16/20 0718 09/16/20 0912  BP: (!) 117/45 (!) 118/31  Pulse: 61 (!) 59  Resp: (!) 22 18  Temp: 37.2 C 36.6 C  SpO2: 96% 96%    Last Pain:  Vitals:   09/16/20 0912  TempSrc: Axillary  PainSc:                  Trevor Iha

## 2020-09-17 ENCOUNTER — Encounter (HOSPITAL_COMMUNITY): Payer: Self-pay | Admitting: Gastroenterology

## 2020-09-17 ENCOUNTER — Other Ambulatory Visit: Payer: Self-pay | Admitting: Nurse Practitioner

## 2020-09-17 DIAGNOSIS — Z7189 Other specified counseling: Secondary | ICD-10-CM | POA: Diagnosis not present

## 2020-09-17 DIAGNOSIS — J9611 Chronic respiratory failure with hypoxia: Secondary | ICD-10-CM | POA: Diagnosis not present

## 2020-09-17 DIAGNOSIS — I5023 Acute on chronic systolic (congestive) heart failure: Secondary | ICD-10-CM | POA: Diagnosis not present

## 2020-09-17 DIAGNOSIS — Z515 Encounter for palliative care: Secondary | ICD-10-CM | POA: Diagnosis not present

## 2020-09-17 DIAGNOSIS — I10 Essential (primary) hypertension: Secondary | ICD-10-CM | POA: Diagnosis not present

## 2020-09-17 DIAGNOSIS — I5033 Acute on chronic diastolic (congestive) heart failure: Secondary | ICD-10-CM | POA: Diagnosis not present

## 2020-09-17 LAB — COMPREHENSIVE METABOLIC PANEL
ALT: 53 U/L — ABNORMAL HIGH (ref 0–44)
AST: 41 U/L (ref 15–41)
Albumin: 2.9 g/dL — ABNORMAL LOW (ref 3.5–5.0)
Alkaline Phosphatase: 133 U/L — ABNORMAL HIGH (ref 38–126)
Anion gap: 9 (ref 5–15)
BUN: 26 mg/dL — ABNORMAL HIGH (ref 8–23)
CO2: 25 mmol/L (ref 22–32)
Calcium: 8.8 mg/dL — ABNORMAL LOW (ref 8.9–10.3)
Chloride: 103 mmol/L (ref 98–111)
Creatinine, Ser: 0.93 mg/dL (ref 0.61–1.24)
GFR, Estimated: >60 mL/min (ref >60)
Glucose, Bld: 104 mg/dL — ABNORMAL HIGH (ref 70–99)
Potassium: 4.5 mmol/L (ref 3.5–5.1)
Sodium: 137 mmol/L (ref 135–145)
Total Bilirubin: 1 mg/dL (ref 0.3–1.2)
Total Protein: 5.3 g/dL — ABNORMAL LOW (ref 6.5–8.1)

## 2020-09-17 LAB — CBC
HCT: 26.6 % — ABNORMAL LOW (ref 39.0–52.0)
Hemoglobin: 8.5 g/dL — ABNORMAL LOW (ref 13.0–17.0)
MCH: 32.1 pg (ref 26.0–34.0)
MCHC: 32 g/dL (ref 30.0–36.0)
MCV: 100.4 fL — ABNORMAL HIGH (ref 80.0–100.0)
Platelets: 333 10*3/uL (ref 150–400)
RBC: 2.65 MIL/uL — ABNORMAL LOW (ref 4.22–5.81)
RDW: 20.5 % — ABNORMAL HIGH (ref 11.5–15.5)
WBC: 10.4 10*3/uL (ref 4.0–10.5)
nRBC: 0.6 % — ABNORMAL HIGH (ref 0.0–0.2)

## 2020-09-17 LAB — MAGNESIUM: Magnesium: 1.9 mg/dL (ref 1.7–2.4)

## 2020-09-17 MED ORDER — FUROSEMIDE 40 MG PO TABS
40.0000 mg | ORAL_TABLET | Freq: Every day | ORAL | Status: DC
  Administered 2020-09-18: 40 mg via ORAL
  Filled 2020-09-17: qty 1

## 2020-09-17 MED ORDER — ASPIRIN EC 81 MG PO TBEC
81.0000 mg | DELAYED_RELEASE_TABLET | Freq: Every day | ORAL | Status: DC
  Administered 2020-09-17 – 2020-09-18 (×2): 81 mg via ORAL
  Filled 2020-09-17 (×2): qty 1

## 2020-09-17 NOTE — Progress Notes (Signed)
[facility] PROGRESS NOTE    Progress Note  Joshua [name]  ZXY:[id number] DOB: 08-22-1932 DOA: 09/12/2020 PCP: Bennie Pierini, FNP     Brief Narrative:   Joshua [name] is an 86 y.o. male past medical history significant for intracerebral AV malformation with a history of a bleed seizure disorder treated with craniotomy 19 ED, IADL and chronic respiratory failure 3 to 4 L, bladder cancer Mobitz type I, pacemaker placement, GERD comes into the ED with progressive worsening dyspnea for the past 2-day an episode of black dark tarry stools, but he is on iron supplements at home.  Assessment/Plan:   Acute on chronic diastolic congestive heart failure (HCC) 2D echo in October showed an EF of 50%. IV Lasix Aldactone and lisinopril. Cardiology felt there may be a component for UIP plus or minus low hemoglobin which may be contributing to his dyspnea. Continue IV diuresis per cardiology he is about 2 L negative today.  Acute on chronic respiratory failure with hypoxia with ILD: Multifactorial in the setting of anemia, mild pulmonary edema and chronic aspiration pneumonia (he is not following treatment took and thin liquids recommendations) looks like he refused nectar thick liquid and repeated MBBS At baseline 3 to 4 L high resolution CT was suggestive of interstitial lung disease, evaluated by Dr. Myrla Halsted recommended GI evaluation and chin tuck method to prevent aspiration. Patient was recommended nectar thick liquids but has declined this.  Heme positive stools/possible melena: Hemoglobin dropped to 7.1, he status post 1 unit of packed red blood cells with a reasonable rise in his hemoglobin to 8.1. EGD on 11/09/2019 showed gastritis, GI was reconsulted on 09/14/2020 recommend small bowel enteroscopy showed A single non-bleeding angiodysplastic lesion in the duodenum.  Stop Plavix today awaiting capsule endoscopy results. Hemoglobin is rising nicely.  Seizure disorder secondary to  intracranial bleed, plan continue Dilantin Cont Dilantin And phenobarbital  Microcytic anemia: Normal B12 and folate levels. His hemoglobin after transfusion is rising nicely.  History of CAD/STEMI with 2 DES stent placed to the RCA in 2017: Aspirin and Plavix which were held due to lower GI bleed and melanotic stools. GI to dictate when to start aspirin and Plavix.  Functional quadriplegia: PT evaluated the patient recommended skilled nursing facility family is pursuing.  Patient would like to go home. He was previously ambulating with a cane unable to do so for the last week.  Goals and ethics: We will have to start talking to family about palliative care.  DVT prophylaxis: SCD Family Communication:son Status is: Inpatient  Remains inpatient appropriate because:Hemodynamically unstable   Dispo: The patient is from: Home              Anticipated d/c is to: SNF              Anticipated d/c date is: 3 days              Patient currently is not medically stable to d/c.        Code Status:     Code Status Orders  (From admission, onward)         Start     Ordered   09/12/20 0211  Full code  Continuous        09/12/20 0219        Code Status History    Date Active Date Inactive Code Status Order ID Comments User Context   06/22/2020 2003 06/26/2020 2326 Full Code [id number]  John Giovanni, MD ED   01/22/2018 1845  01/26/2018 1705 Full Code [id number]  Evans Lance, MD Inpatient   06/15/2017 1834 06/16/2017 1510 Full Code [id number]  [name], MD Inpatient   06/01/2016 1701 06/04/2016 1928 Full Code [id number]  Geradine Girt, DO Inpatient   04/27/2016 1405 05/10/2016 1631 Full Code [id number]  Belva Crome, MD Inpatient   Advance Care Planning Activity    Advance Directive Documentation   Flowsheet Row Most Recent Value  Type of Advance Directive Healthcare Power of Attorney, Living will  Pre-existing out of facility DNR order (yellow form or pink MOST form) --   "MOST" Form in Place? --        IV Access:    Peripheral IV   Procedures and diagnostic studies:   No results found.   Medical Consultants:    None.  Anti-Infectives:   none  Subjective:    [name] very talkative this morning and in a good mood.  Objective:    Vitals:   09/16/20 0955 09/16/20 1135 09/16/20 1950 09/17/20 0417  BP: 131/68 (!) 110/50 112/61 (!) 126/57  Pulse: 61 (!) 59 78 74  Resp: 18 18 20 18   Temp: (!) 97.5 F (36.4 C) 97.8 F (36.6 C) 99.1 F (37.3 C) 98.5 F (36.9 C)  TempSrc: Oral Oral Oral Oral  SpO2: 100% 95% 98% 91%  Weight:    79.9 kg  Height:       SpO2: 91 % O2 Flow Rate (L/min): 3 L/min FiO2 (%): 21 %   Intake/Output Summary (Last 24 hours) at 09/17/2020 0734 Last data filed at 09/17/2020 0420 Gross per 24 hour  Intake 480 ml  Output 700 ml  Net -220 ml   Filed Weights   09/16/20 0718 09/16/20 0921 09/17/20 0417  Weight: 80.9 kg 80.9 kg 79.9 kg    Exam: General exam: In no acute distress. Respiratory system: Good air movement and clear to auscultation. Cardiovascular system: S1 & S2 heard, RRR. + JVD. Gastrointestinal system: Abdomen is nondistended, soft and nontender.  Extremities: No pedal edema. Skin: No rashes, lesions or ulcers Psychiatry: Judgement and insight appear normal. Mood & affect appropriate.  Data Reviewed:    Labs: Basic Metabolic Panel: Recent Labs  Lab 09/13/20 0337 09/14/20 0214 09/15/20 0407 09/16/20 0355 09/17/20 0252  NA 140 139 136 138 137  K 3.6 3.5 3.7 3.4* 4.5  CL 103 102 99 102 103  CO2 26 26 26 27 25   GLUCOSE 104* 167* 121* 99 104*  BUN 42* 39* 35* 30* 26*  CREATININE 0.88 1.05 1.06 1.02 0.93  CALCIUM 8.6* 8.7* 8.5* 8.6* 8.8*  MG 2.2 2.0 2.0 1.9 1.9   GFR Estimated Creatinine Clearance: 62.6 mL/min (by C-G formula based on SCr of 0.93 mg/dL). Liver Function Tests: Recent Labs  Lab 09/13/20 0337 09/14/20 0214 09/15/20 0407 09/16/20 0355 09/17/20 0252   AST 102* 83* 61* 42* 41  ALT 109* 115* 94* 74* 53*  ALKPHOS 134* 144* 153* 139* 133*  BILITOT 0.8 0.8 0.7 0.5 1.0  PROT 5.7* 5.6* 5.6* 5.4* 5.3*  ALBUMIN 3.2* 3.0* 3.0* 2.8* 2.9*   No results for input(s): LIPASE, AMYLASE in the last 168 hours. No results for input(s): AMMONIA in the last 168 hours. Coagulation profile Recent Labs  Lab 09/12/20 0015 09/13/20 0337 09/14/20 0214 09/15/20 0407  INR 1.3* 1.3* 1.4* 1.4*   COVID-19 Labs  No results for input(s): DDIMER, FERRITIN, LDH, CRP in the last 72 hours.  Lab Results  Component Value Date   SARSCOV2NAA  NEGATIVE 09/12/2020   SARSCOV2NAA NEGATIVE 06/22/2020    CBC: Recent Labs  Lab 09/12/20 0015 09/12/20 0644 09/13/20 0337 09/14/20 0214 09/15/20 0407 09/16/20 0355 09/17/20 0456  WBC 13.2*   < > 9.6 11.8* 9.4 8.5 10.4  NEUTROABS 9.8*  --   --   --   --   --   --   HGB 8.3*   < > 7.3* 8.1* 8.0* 7.8* 8.5*  HCT 26.9*   < > 23.8* 25.0* 25.1* 25.3* 26.6*  MCV 105.5*   < > 104.4* 102.0* 100.8* 102.0* 100.4*  PLT 318   < > 263 263 268 280 333   < > = values in this interval not displayed.   Cardiac Enzymes: No results for input(s): CKTOTAL, CKMB, CKMBINDEX, TROPONINI in the last 168 hours. BNP (last 3 results) No results for input(s): PROBNP in the last 8760 hours. CBG: No results for input(s): GLUCAP in the last 168 hours. D-Dimer: No results for input(s): DDIMER in the last 72 hours. Hgb A1c: No results for input(s): HGBA1C in the last 72 hours. Lipid Profile: No results for input(s): CHOL, HDL, LDLCALC, TRIG, CHOLHDL, LDLDIRECT in the last 72 hours. Thyroid function studies: No results for input(s): TSH, T4TOTAL, T3FREE, THYROIDAB in the last 72 hours.  Invalid input(s): FREET3 Anemia work up: No results for input(s): VITAMINB12, FOLATE, FERRITIN, TIBC, IRON, RETICCTPCT in the last 72 hours. Sepsis Labs: Recent Labs  Lab 09/14/20 0214 09/15/20 0407 09/16/20 0355 09/17/20 0456  WBC 11.8* 9.4 8.5 10.4    Microbiology Recent Results (from the past 240 hour(s))  Resp Panel by RT-PCR (Flu A&B, Covid) Nasopharyngeal Swab     Status: None   Collection Time: 09/12/20 12:14 AM   Specimen: Nasopharyngeal Swab; Nasopharyngeal(NP) swabs in vial transport medium  Result Value Ref Range Status   SARS Coronavirus 2 by RT PCR NEGATIVE NEGATIVE Final    Comment: (NOTE) SARS-CoV-2 target nucleic acids are NOT DETECTED.  The SARS-CoV-2 RNA is generally detectable in upper respiratory specimens during the acute phase of infection. The lowest concentration of SARS-CoV-2 viral copies this assay can detect is 138 copies/mL. A negative result does not preclude SARS-Cov-2 infection and should not be used as the sole basis for treatment or other patient management decisions. A negative result may occur with  improper specimen collection/handling, submission of specimen other than nasopharyngeal swab, presence of viral mutation(s) within the areas targeted by this assay, and inadequate number of viral copies(<138 copies/mL). A negative result must be combined with clinical observations, patient history, and epidemiological information. The expected result is Negative.  Fact Sheet for Patients:  BloggerCourse.com  Fact Sheet for Healthcare Providers:  SeriousBroker.it  This test is no t yet approved or cleared by the [place] FDA and  has been authorized for detection and/or diagnosis of SARS-CoV-2 by FDA under an Emergency Use Authorization (EUA). This EUA will remain  in effect (meaning this test can be used) for the duration of the COVID-19 declaration under Section 564(b)(1) of the Act, 21 U.S.C.section 360bbb-3(b)(1), unless the authorization is terminated  or revoked sooner.       Influenza A by PCR NEGATIVE NEGATIVE Final   Influenza B by PCR NEGATIVE NEGATIVE Final    Comment: (NOTE) The Xpert Xpress SARS-CoV-2/FLU/RSV plus assay is  intended as an aid in the diagnosis of influenza from Nasopharyngeal swab specimens and should not be used as a sole basis for treatment. Nasal washings and aspirates are unacceptable for Xpert Xpress SARS-CoV-2/FLU/RSV  testing.  Fact Sheet for Patients: EntrepreneurPulse.com.au  Fact Sheet for Healthcare Providers: IncredibleEmployment.be  This test is not yet approved or cleared by the [place] FDA and has been authorized for detection and/or diagnosis of SARS-CoV-2 by FDA under an Emergency Use Authorization (EUA). This EUA will remain in effect (meaning this test can be used) for the duration of the COVID-19 declaration under Section 564(b)(1) of the Act, 21 U.S.C. section 360bbb-3(b)(1), unless the authorization is terminated or revoked.  Performed at [facility], [address], [place], [place] [address]   MRSA PCR Screening     Status: None   Collection Time: 09/12/20  4:15 PM   Specimen: Nasal Mucosa; Nasopharyngeal  Result Value Ref Range Status   MRSA by PCR NEGATIVE NEGATIVE Final    Comment:        The GeneXpert MRSA Assay (FDA approved for NASAL specimens only), is one component of a comprehensive MRSA colonization surveillance program. It is not intended to diagnose MRSA infection nor to guide or monitor treatment for MRSA infections. Performed at [facility], [address], [place], [place] [address]      Medications:   . atorvastatin  20 mg Oral QPM  . calcium carbonate  500 mg of elemental calcium Oral Q breakfast  . cholecalciferol  2,000 Units Oral Daily  . citalopram  20 mg Oral Daily  . ferrous sulfate  325 mg Oral BID WC  . furosemide  40 mg Intravenous Daily  . multivitamin  1 tablet Oral BID  . pantoprazole (PROTONIX) IV  40 mg Intravenous Q12H  . PHENobarbital  64.8 mg Oral BID  . phenytoin  100 mg Oral TID PC & HS  . potassium chloride  40 mEq Oral BID   Continuous Infusions:    LOS: 4 days    Charlynne Cousins  [facility]  09/17/2020, 7:34 AM

## 2020-09-17 NOTE — Progress Notes (Addendum)
Progress Note  Patient Name: Joshua [name] Date of Encounter: 09/17/2020  [facility] Cardiologist: Belva Crome III, MD   Subjective   Breathing is stable on supplemental oxygen but reports worsen with movement. No chest pain or palpitations.   Inpatient Medications    Scheduled Meds: . atorvastatin  20 mg Oral QPM  . calcium carbonate  500 mg of elemental calcium Oral Q breakfast  . cholecalciferol  2,000 Units Oral Daily  . citalopram  20 mg Oral Daily  . ferrous sulfate  325 mg Oral BID WC  . furosemide  40 mg Intravenous Daily  . multivitamin  1 tablet Oral BID  . pantoprazole (PROTONIX) IV  40 mg Intravenous Q12H  . PHENobarbital  64.8 mg Oral BID  . phenytoin  100 mg Oral TID PC & HS  . potassium chloride  40 mEq Oral BID   Continuous Infusions:  PRN Meds: acetaminophen **OR** acetaminophen, albuterol, camphor-menthol, Muscle Rub, ondansetron **OR** ondansetron (ZOFRAN) IV, polyvinyl alcohol   Vital Signs    Vitals:   09/16/20 0955 09/16/20 1135 09/16/20 1950 09/17/20 0417  BP: 131/68 (!) 110/50 112/61 (!) 126/57  Pulse: 61 (!) 59 78 74  Resp: 18 18 20 18   Temp: (!) 97.5 F (36.4 C) 97.8 F (36.6 C) 99.1 F (37.3 C) 98.5 F (36.9 C)  TempSrc: Oral Oral Oral Oral  SpO2: 100% 95% 98% 91%  Weight:    79.9 kg  Height:        Intake/Output Summary (Last 24 hours) at 09/17/2020 0802 Last data filed at 09/17/2020 0756 Gross per 24 hour  Intake 600 ml  Output 700 ml  Net -100 ml   Last 3 Weights 09/17/2020 09/16/2020 09/16/2020  Weight (lbs) 176 lb 2.4 oz 178 lb 5.6 oz 178 lb 5.6 oz  Weight (kg) 79.9 kg 80.9 kg 80.9 kg      Telemetry    AV paced - Personally Reviewed  ECG    N/A  Physical Exam   GEN: elderly frail male in no acute distress.   Neck: + JVD Cardiac: RRR, no murmurs, rubs, or gallops.  Respiratory: diminished breath sound at base GI: Soft, nontender, non-distended  MS: No edema; No deformity. Neuro:  Nonfocal  Psych:  Normal affect   Labs    Chemistry Recent Labs  Lab 09/15/20 0407 09/16/20 0355 09/17/20 0252  NA 136 138 137  K 3.7 3.4* 4.5  CL 99 102 103  CO2 26 27 25   GLUCOSE 121* 99 104*  BUN 35* 30* 26*  CREATININE 1.06 1.02 0.93  CALCIUM 8.5* 8.6* 8.8*  PROT 5.6* 5.4* 5.3*  ALBUMIN 3.0* 2.8* 2.9*  AST 61* 42* 41  ALT 94* 74* 53*  ALKPHOS 153* 139* 133*  BILITOT 0.7 0.5 1.0  GFRNONAA >60 >60 >60  ANIONGAP 11 9 9      Hematology Recent Labs  Lab 09/15/20 0407 09/16/20 0355 09/17/20 0456  WBC 9.4 8.5 10.4  RBC 2.49* 2.48* 2.65*  HGB 8.0* 7.8* 8.5*  HCT 25.1* 25.3* 26.6*  MCV 100.8* 102.0* 100.4*  MCH 32.1 31.5 32.1  MCHC 31.9 30.8 32.0  RDW 20.5* 20.5* 20.5*  PLT 268 280 333    BNP Recent Labs  Lab 09/13/20 0337 09/14/20 0214 09/15/20 0407  BNP 866.0* 849.9* 976.5*     Radiology    No results found.  Cardiac Studies   N/A  Patient Profile     86 y.o. male with past medical history of coronary artery  disease, hypertension, hyperlipidemia, prior pacemaker, COPD, interstitial lung disease, prior intracranial hemorrhage secondary to AV malformation, seizures, prostate cancer, bladder cancer seen for evaluation of acute on chronic diastolic congestive heart failure. Most recent echocardiogram October 2021 showed normal LV function, mild left ventricular hypertrophy, grade 1 diastolic dysfunction, moderate pulmonary hypertension, mild right atrial enlargement, mild mitral regurgitation. High resolution chest CT showed emphysema and probable UIP.   Assessment & Plan    1. Acute on chronic diastolic CHF 2. Dyspnea - His dyspnea felt multifactorial 2nd to emphysema/UIP, anemia and diastolic heart failure.  - Net I & O -1.7L. Weight down 2 lb since admit (178>>176lb).  -On IV lasix 40mg  qd  2. CAD - No chest pain - Last PCI was 04/2016 - ASA and Plavix hold for anemia and GI procedure - Per Dr. 05/2016 recommendations will resume ASA EC 81mg  today. No Plan  for resumption of Plavix.  - Continue statin   3. Anemia/Heme positive stool - S/p 1 unit of PRBCs - Small bowel enteroscopy yesterday showed a single non-bleeding angiodysplastic lesion in the duodenum  - Per GI - Hgb 8.5 today   4. UIP/Emphysema - Per pulmonary    For questions or updates, please contact [facility] Please consult [email or web address] for contact info under   Signed, Ludwig Clarks, PA  09/17/2020, 8:02 AM   As above, patient seen and examined.  He remains dyspneic with activities.  I think this is multifactorial including severe underlying lung disease with superimposed diastolic CHF and anemia.  We will change Lasix to 40 mg by mouth daily beginning tomorrow.  Continue to follow renal function.  Gastroenterology is evaluating the anemia.  Pulmonary/critical care is managing lung disease. Manson Passey, MD

## 2020-09-17 NOTE — TOC Progression Note (Signed)
Transition of Care [facility]) - Progression Note    Patient Details  Name: Joshua [name] MRN: [id number] Date of Birth: 09/23/1931  Transition of Care [facility]) CM/SW Contact  Erin Sons, [place] Phone Number: 09/17/2020, 2:55 PM  Clinical Narrative:     Called and updated pt son about possible d/c Friday or Saturday.   Expected Discharge Plan: Skilled Nursing Facility Barriers to Discharge: Continued Medical Work up  Expected Discharge Plan and Services Expected Discharge Plan: Skilled Nursing Facility In-house Referral: NA Discharge Planning Services: NA Post Acute Care Choice: NA                   DME Arranged: N/A DME Agency: NA       HH Arranged: NA HH Agency: NA         Social Determinants of Health (SDOH) Interventions    Readmission Risk Interventions Readmission Risk Prevention Plan 09/13/2020  Transportation Screening Complete  PCP or Specialist Appt within 3-5 Days Not Complete  Not Complete comments Will need appt closer to discharge  HRI or Home Care Consult Complete  Social Work Consult for Recovery Care Planning/Counseling Complete  Palliative Care Screening Not Applicable  Medication Review [employer]) Complete  Some recent data might be hidden

## 2020-09-17 NOTE — Care Management Important Message (Signed)
Important Message  Patient Details  Name: Joshua [name] MRN: [id number] Date of Birth: 1932-02-07   Medicare Important Message Given:  Yes     Renie Ora 09/17/2020, 9:50 AM

## 2020-09-17 NOTE — Progress Notes (Signed)
Daily Progress Note   Patient Name: Joshua [name]       Date: 09/17/2020 DOB: Jan 19, 1932  Age: 86 y.o. MRN#: [id number] Attending Physician: Charlynne Cousins, MD Primary Care Physician: Chevis Pretty, FNP Admit Date: 09/12/2020  Reason for Consultation/Follow-up: Establishing goals of care  Subjective: Feels better, breathing well, no chest pain. Tells me it is okay to call son.  Length of Stay: 4  Current Medications: Scheduled Meds:   aspirin EC  81 mg Oral Daily   atorvastatin  20 mg Oral QPM   calcium carbonate  500 mg of elemental calcium Oral Q breakfast   cholecalciferol  2,000 Units Oral Daily   citalopram  20 mg Oral Daily   ferrous sulfate  325 mg Oral BID WC   [START ON 09/18/2020] furosemide  40 mg Oral Daily   multivitamin  1 tablet Oral BID   pantoprazole (PROTONIX) IV  40 mg Intravenous Q12H   PHENobarbital  64.8 mg Oral BID   phenytoin  100 mg Oral TID PC & HS    Continuous Infusions:   PRN Meds: acetaminophen **OR** acetaminophen, albuterol, camphor-menthol, Muscle Rub, ondansetron **OR** ondansetron (ZOFRAN) IV, polyvinyl alcohol  Physical Exam Constitutional:      General: He is not in acute distress.    Comments: Sitting up in chair  Pulmonary:     Effort: Pulmonary effort is normal.     Comments: Remains of 4L [place] Neurological:     Mental Status: He is alert.             Vital Signs: BP (!) 126/57 (BP Location: Right Wrist)    Pulse 74    Temp 98.5 F (36.9 C) (Oral)    Resp 18    Ht 6' (1.829 m)    Wt 79.9 kg    SpO2 91%    BMI 23.89 kg/m  SpO2: SpO2: 91 % O2 Device: O2 Device: Nasal Cannula O2 Flow Rate: O2 Flow Rate (L/min): 3 L/min  Intake/output summary:   Intake/Output Summary (Last 24 hours) at 09/17/2020 1045 Last data  filed at 09/17/2020 0756 Gross per 24 hour  Intake 600 ml  Output 700 ml  Net -100 ml   LBM: Last BM Date: 09/13/20 Baseline Weight: Weight: 77.1 kg Most recent weight: Weight: 79.9 kg       Palliative Assessment/Data: PPS 50%    Flowsheet Rows   Flowsheet Row Most Recent Value  Intake Tab   Referral Department Hospitalist  Unit at Time of Referral Cardiac/Telemetry Unit  Palliative Care Primary Diagnosis Pulmonary  Date Notified 09/12/20  Palliative Care Type New Palliative care  Reason for referral Clarify Goals of Care  Date of Admission 09/12/20  Date first seen by Palliative Care 09/14/20  # of days Palliative referral response time 2 Day(s)  # of days IP prior to Palliative referral 0  Clinical Assessment   Palliative Performance Scale Score 50%  Psychosocial & Spiritual Assessment   Palliative Care Outcomes   Patient/Family meeting held? Yes  Who was at the meeting? son  Palliative Care Outcomes Clarified goals of care, Counseled regarding hospice, Provided psychosocial or spiritual support, Linked to palliative care logitudinal support  Patient Active Problem List   Diagnosis Date Noted   SOB (shortness of breath)    Chronic pulmonary edema    Melena    Acute heart failure ([place]) 09/13/2020   Acute on chronic diastolic congestive heart failure ([place]) 09/12/2020   Macrocytic anemia 09/12/2020   Functional quadriplegia ([place]) 09/12/2020   CAD (coronary artery disease)    Heme positive stool    Anemia    Chronic respiratory failure with hypoxia (HCC)    Right lower lobe pneumonia 06/22/2020   CHF exacerbation ([place]) 06/22/2020   Elevated troponin 06/22/2020   Chronic diastolic heart failure ([place]) 11/25/2019   High risk medication use 07/19/2019   Pseudophakia, both eyes 09/20/2018   Exudative age-related macular degeneration ([place]) 09/20/2018   Second degree AV block 06/15/2017   Aortic atherosclerosis ([place]) 06/03/2016   General  weakness    Old MI (myocardial infarction) 04/27/2016   Bladder cancer ([place]) 04/18/2016   Vitamin D deficiency 10/15/2015   Prostate cancer ([place])    Hypercholesterolemia    Essential hypertension    Seizures ([place])    GERD 08/22/2008   COLONIC POLYPS, ADENOMATOUS, HX OF 08/22/2008    Palliative Care Assessment & Plan   HPI: Per intake H&Pon 09/12/20 By Dr. Lillia Corporal a 86 y.o.malewith medical history significant ofintracerebral AV malformation with a history of intracerebral bleed and seizure treated with craniotomy in 1980, osteoarthritis, lower extremity edema, bladder cancer, CAD, COPD, Mobitz 1 arrhythmia, pacemaker placement GERD, hyperlipidemia, hypertension, macular degeneration, vitamin D deficiency who is coming to the emergencydepartmentdue to progressively worse dyspneafor the past 2 days and an episode of dark tarry looking stools yesterday. He takes a daily iron supplement, but states that usually the stools do not look as dark. He denies abdominal pain, nausea, emesis, diarrhea, constipation or BRBPR. He mentions that he has been taking Aleve nearly daily for the past year. No fever, chills, but positive generalized weakness. No wheezing or hemoptysis. No chest pain, palpitations, diaphoresis, dysuria, frequency or hematuria. No polyuria, polydipsia, polyphagia or blurred vision."  Assessment: F/u with patient and son. Patient not very talkative, tells me to call son for further discussion.  Spoke with son. He tells me he and patient along with patient's daughter all met together last night to discuss goals of care. They reviewed my conversation with patient's son yesterday and discussed option of going to rehab vs going home and avoiding further aggressive interventions. Patient would like to "try rehab". They express understanding that it may not go well, patient may be too weak but they would like to honor patient's wishes and at least try. We  confirm palliative to follow at SNF. Relayed message about conversations with son and patient to outpatient palliative team.   Recommendations/Plan:  Rehab with palliative to follow at discharge - referral has been made  Code Status:  Full code  Prognosis:   Unable to determine  Discharge Planning:  [place] for rehab with Palliative care service follow-up  Care plan was discussed with son and nurse  Thank you for allowing the Palliative Medicine Team to assist in the care of this patient.   Total Time 20 min Prolonged Time Billed  no       Greater than 50%  of this time was spent counseling and coordinating care related to the above assessment and plan.  Juel Burrow, DNP, [facility] Palliative Medicine Team Team Phone # [phone]  Pager [phone]

## 2020-09-17 NOTE — Progress Notes (Signed)
  Speech Language Pathology Treatment: Dysphagia  Patient Details Name: Joshua [name] MRN: [id number] DOB: 1932-01-08 Today's Date: 09/17/2020 Time: 2130-8657 SLP Time Calculation (min) (ACUTE ONLY): 12 min  Assessment / Plan / Recommendation Clinical Impression  Pt verbalizes awareness of chin tuck and believes it is beneficial and that he coughs less when utilizing a chin tuck. SLP reinforced a deeper chin tuck during session. Also pointed out wet vocal quality and cued throat clearing. Reiterated other basic aspiration precautions. Pt participatory and willing to comply. All education complete at this time. Will sign off.   HPI HPI: Pt is an 86 yo male admitted with worsening dyspnea. CT Chest consistent with ILD but without acute consolidative airspace disease. Most recent MBS in 06/2020 revealed silent aspiration of thin liquids due to impaired timing and laryngeal vestibule closure. This was eliminated with use of a chin tuck OR modifying liquids to nectar thick consistency.  PMH includes:  intracerebral AV malformation with a history of intracerebral bleed and seizure treated with craniotomy in 1980, ILD with chronic resp failure on 3-4 L O2, osteoarthritis, lower extremity edema, bladder cancer, CAD, COPD, Mobitz 1 arrhythmia, pacemaker placement GERD, hyperlipidemia, hypertension, macular degeneration, vitamin D deficiency      SLP Plan  Continue with current plan of care       Recommendations  Diet recommendations: Regular;Thin liquid Liquids provided via: Straw Medication Administration: Whole meds with puree Supervision: Patient able to self feed                Oral Care Recommendations: Oral care BID Follow up Recommendations: Skilled Nursing facility Plan: Continue with current plan of care       GO                Sreshta Cressler, Riley Nearing 09/17/2020, 1:25 PM

## 2020-09-17 NOTE — Progress Notes (Signed)
Physical Therapy Treatment Patient Details Name: Joshua [name] MRN: [id number] DOB: July 30, 1932 Today's Date: 09/17/2020    History of Present Illness [name] is a 86 y.o. male with medical history significant of intracerebral AV malformation with a history of intracerebral bleed and seizure treated with craniotomy in 1980, osteoarthritis, lower extremity edema, bladder cancer, CAD, COPD, Mobitz 1 arrhythmia, pacemaker placement GERD, hyperlipidemia, hypertension, macular degeneration, vitamin D deficiency who is coming to the emergency department due to progressively worse dyspnea for the past 2 days and an episode of dark tarry looking stools yesterday.  He takes a daily iron supplement, but states that usually the stools do not look as dark.  He denies abdominal pain, nausea, emesis, diarrhea, constipation or BRBPR.  He mentions that he has been taking Aleve nearly daily for the past year.  No fever, chills, but positive generalized weakness.  No wheezing or hemoptysis.  No chest pain, palpitations, diaphoresis, dysuria, frequency or hematuria.  No polyuria, polydipsia, polyphagia or blurred vision.    PT Comments    Returned to see patient with +2 assist for transferring to recliner. Patient continues to require mod assist for bed mobility, mod +1-2 for sit to stand transfer from elevated bed. +2 assist for ambulation of 3 feet to recliner with RW. Patient is very weak, but motivated to improve.         Follow Up Recommendations  SNF;Supervision/Assistance - 24 hour     Equipment Recommendations  None recommended by PT    Recommendations for Other Services       Precautions / Restrictions Precautions Precautions: Fall Precaution Comments: watch O2 Restrictions Weight Bearing Restrictions: No    Mobility  Bed Mobility Overal bed mobility: Needs Assistance Bed Mobility: Supine to Sit     Supine to sit: Mod assist Sit to supine: Mod assist   General bed mobility comments:  assist for scooting to edge of bed and to support trunk. Patient tends to fall posteriorly due to weakness  Transfers Overall transfer level: Needs assistance Equipment used: Rolling walker (2 wheeled) Transfers: Sit to/from [employer] Sit to Stand: From elevated surface;Mod assist;+2 physical assistance Stand pivot transfers: Mod assist;+2 physical assistance;From elevated surface       General transfer comment: mod assist with sit to stand from elevated bed. Cues for safe hand placement, foot placement. Patient needed to sit back down after standing briefly.  Ambulation/Gait Ambulation/Gait assistance: Mod assist;+2 physical assistance Gait Distance (Feet): 3 Feet Assistive device: Rolling walker (2 wheeled) Gait Pattern/deviations: Decreased stride length;Decreased step length - right Gait velocity: decreased   General Gait Details: tends to drag right LE, mod assist for safety, balance, max cues   Stairs             Wheelchair Mobility    Modified Rankin (Stroke Patients Only)       Balance Overall balance assessment: Needs assistance Sitting-balance support: Feet supported Sitting balance-Leahy Scale: Poor Sitting balance - Comments: poor sitting balance due to weakness, forward head, falling back posteriorly Postural control: Posterior lean Standing balance support: During functional activity;Bilateral upper extremity supported Standing balance-Leahy Scale: Poor Standing balance comment: using RW                            Cognition Arousal/Alertness: Awake/alert Behavior During Therapy: WFL for tasks assessed/performed Overall Cognitive Status: Impaired/Different from baseline Area of Impairment: Awareness;Safety/judgement  Safety/Judgement: Decreased awareness of safety;Decreased awareness of deficits Awareness: Intellectual Problem Solving: Requires verbal cues;Requires tactile  cues General Comments: Pt A&Ox3 with increased time, decreased awareness of deficits, cues provided for initiation/problem solving      Exercises      General Comments        Pertinent Vitals/Pain Pain Assessment: No/denies pain Faces Pain Scale: No hurt    Home Living                      Prior Function            PT Goals (current goals can now be found in the care plan section) Acute Rehab PT Goals Patient Stated Goal: return home, get stronger PT Goal Formulation: With patient Time For Goal Achievement: 09/27/20 Potential to Achieve Goals: Fair Progress towards PT goals: Progressing toward goals    Frequency    Min 3X/week      PT Plan Current plan remains appropriate    Co-evaluation              AM-PAC PT "6 Clicks" Mobility   Outcome Measure  Help needed turning from your back to your side while in a flat bed without using bedrails?: A Lot Help needed moving from lying on your back to sitting on the side of a flat bed without using bedrails?: A Lot Help needed moving to and from a bed to a chair (including a wheelchair)?: A Lot Help needed standing up from a chair using your arms (e.g., wheelchair or bedside chair)?: A Lot Help needed to walk in hospital room?: A Lot Help needed climbing 3-5 steps with a railing? : Total 6 Click Score: 11    End of Session Equipment Utilized During Treatment: Gait belt;Oxygen Activity Tolerance: Patient limited by fatigue Patient left: in chair;with call bell/phone within reach;with chair alarm set;Other (comment) (lift pad under him in recliner) Nurse Communication: Mobility status;Need for lift equipment PT Visit Diagnosis: Unsteadiness on feet (R26.81);Muscle weakness (generalized) (M62.81);Other abnormalities of gait and mobility (R26.89);Difficulty in walking, not elsewhere classified (R26.2)     Time: [id number] PT Time Calculation (min) (ACUTE ONLY): 15 min  Charges:  $Gait Training: 8-22  mins $Therapeutic Activity: 8-22 mins                     Verdelle Valtierra, PT, GCS 09/17/20,11:05 AM

## 2020-09-17 NOTE — Progress Notes (Signed)
Physical Therapy Treatment Patient Details Name: Joshua [name] MRN: [id number] DOB: Nov 09, 1931 Today's Date: 09/17/2020    History of Present Illness Joshua [name] is a 86 y.o. male with medical history significant of intracerebral AV malformation with a history of intracerebral bleed and seizure treated with craniotomy in 1980, osteoarthritis, lower extremity edema, bladder cancer, CAD, COPD, Mobitz 1 arrhythmia, pacemaker placement GERD, hyperlipidemia, hypertension, macular degeneration, vitamin D deficiency who is coming to the emergency department due to progressively worse dyspnea for the past 2 days and an episode of dark tarry looking stools yesterday.  He takes a daily iron supplement, but states that usually the stools do not look as dark.  He denies abdominal pain, nausea, emesis, diarrhea, constipation or BRBPR.  He mentions that he has been taking Aleve nearly daily for the past year.  No fever, chills, but positive generalized weakness.  No wheezing or hemoptysis.  No chest pain, palpitations, diaphoresis, dysuria, frequency or hematuria.  No polyuria, polydipsia, polyphagia or blurred vision.    PT Comments    Patient received in bed, reports he would like to get up to chair. Requires mod assist with supine to sit. Mod assist to scoot forward to edge of bed. Min assist for sitting balance due to posterior lean due to weakness. Patient is able to stand from raised bed with mod assist. Standing with RW briefly before sitting back down. He is very weak and wants to get oob but is unsafe to attempt this with +1 assist. Patient will continue to benefit from skilled PT while here to improve strength and functional mobility.     Follow Up Recommendations  SNF;Supervision/Assistance - 24 hour     Equipment Recommendations  None recommended by PT    Recommendations for Other Services       Precautions / Restrictions Precautions Precautions: Fall Restrictions Weight Bearing Restrictions:  No    Mobility  Bed Mobility Overal bed mobility: Needs Assistance Bed Mobility: Supine to Sit;Sit to Supine     Supine to sit: Mod assist Sit to supine: Mod assist   General bed mobility comments: Assist for RLE off edge of bed, trunk to upright, use of bed pad to scoot hips forward. Slow and effortful  Transfers Overall transfer level: Needs assistance Equipment used: Rolling walker (2 wheeled) Transfers: Sit to/from Stand Sit to Stand: Mod assist;From elevated surface         General transfer comment: mod assist with sit to stand from elevated bed. Cues for safe hand placement, foot placement. Patient needed to sit back down after standing briefly.  Ambulation/Gait                 Stairs             Wheelchair Mobility    Modified Rankin (Stroke Patients Only)       Balance Overall balance assessment: Needs assistance Sitting-balance support: Feet supported Sitting balance-Leahy Scale: Poor Sitting balance - Comments: poor sitting balance due to weakness, forward head, falling back posteriorly Postural control: Posterior lean Standing balance support: Bilateral upper extremity supported;During functional activity Standing balance-Leahy Scale: Poor Standing balance comment: using RW                            Cognition Arousal/Alertness: Awake/alert;Lethargic Behavior During Therapy: WFL for tasks assessed/performed Overall Cognitive Status: Impaired/Different from baseline Area of Impairment: Awareness;Safety/judgement;Problem solving  Safety/Judgement: Decreased awareness of safety;Decreased awareness of deficits Awareness: Intellectual Problem Solving: Requires verbal cues;Requires tactile cues General Comments: Pt A&Ox3 with increased time, decreased awareness of deficits, cues provided for initiation/problem solving      Exercises      General Comments        Pertinent Vitals/Pain Faces  Pain Scale: No hurt    Home Living                      Prior Function            PT Goals (current goals can now be found in the care plan section) Acute Rehab PT Goals Patient Stated Goal: return home, get stronger PT Goal Formulation: With patient Time For Goal Achievement: 09/27/20 Potential to Achieve Goals: Fair Progress towards PT goals: Progressing toward goals    Frequency    Min 3X/week      PT Plan Current plan remains appropriate    Co-evaluation              AM-PAC PT "6 Clicks" Mobility   Outcome Measure  Help needed turning from your back to your side while in a flat bed without using bedrails?: A Little Help needed moving from lying on your back to sitting on the side of a flat bed without using bedrails?: A Lot Help needed moving to and from a bed to a chair (including a wheelchair)?: A Lot Help needed standing up from a chair using your arms (e.g., wheelchair or bedside chair)?: A Lot Help needed to walk in hospital room?: A Lot Help needed climbing 3-5 steps with a railing? : Total 6 Click Score: 12    End of Session Equipment Utilized During Treatment: Gait belt;Oxygen Activity Tolerance: Patient limited by fatigue Patient left: in bed;with call bell/phone within reach;with bed alarm set Nurse Communication: Mobility status PT Visit Diagnosis: Unsteadiness on feet (R26.81);Muscle weakness (generalized) (M62.81);Other abnormalities of gait and mobility (R26.89);Difficulty in walking, not elsewhere classified (R26.2)     Time: 0488-8916 PT Time Calculation (min) (ACUTE ONLY): 18 min  Charges:  $Therapeutic Activity: 8-22 mins                     Raine Blodgett, PT, GCS 09/17/20,9:46 AM

## 2020-09-17 NOTE — Progress Notes (Addendum)
Daily Rounding Note  09/17/2020, 10:24 AM  LOS: 4 days   SUBJECTIVE:   Chief complaint:   Macrocytic anemia.  Heme positive.  Dark stool last night.  Tolerating solid food.  No nausea, vomiting or abdominal pain Reports that his breathing is more labored when he moves around in the bed.  No chest pain, no palpitations.  OBJECTIVE:         Vital signs in last 24 hours:    Temp:  [97.8 F (36.6 C)-99.1 F (37.3 C)] 98.5 F (36.9 C) (12/30 0417) Pulse Rate:  [59-78] 74 (12/30 0417) Resp:  [18-20] 18 (12/30 0417) BP: (110-126)/(50-61) 126/57 (12/30 0417) SpO2:  [91 %-98 %] 91 % (12/30 0417) Weight:  [79.9 kg] 79.9 kg (12/30 0417) Last BM Date: 09/13/20 Filed Weights   09/16/20 0718 09/16/20 0921 09/17/20 0417  Weight: 80.9 kg 80.9 kg 79.9 kg   General: Frail, elderly, comfortable. Heart: Positive JVD.  RRR.  No MRG. Chest: Clear but diminished breath sounds at the bases. Abdomen: No tenderness or distention.  Soft.  Active bowel sounds. Extremities: No CCE. Neuro/Psych: Alert.  Oriented to place and self, appropriate.  Moves all 4 limbs, strength not tested.  No tremors  Intake/Output from previous day: 12/29 0701 - 12/30 0700 In: 480 [P.O.:480] Out: 700 [Urine:700]  Intake/Output this shift: Total I/O In: 120 [P.O.:120] Out: -   Lab Results: Recent Labs    09/15/20 0407 09/16/20 0355 09/17/20 0456  WBC 9.4 8.5 10.4  HGB 8.0* 7.8* 8.5*  HCT 25.1* 25.3* 26.6*  PLT 268 280 333   BMET Recent Labs    09/15/20 0407 09/16/20 0355 09/17/20 0252  NA 136 138 137  K 3.7 3.4* 4.5  CL 99 102 103  CO2 26 27 25   GLUCOSE 121* 99 104*  BUN 35* 30* 26*  CREATININE 1.06 1.02 0.93  CALCIUM 8.5* 8.6* 8.8*   LFT Recent Labs    09/15/20 0407 09/16/20 0355 09/17/20 0252  PROT 5.6* 5.4* 5.3*  ALBUMIN 3.0* 2.8* 2.9*  AST 61* 42* 41  ALT 94* 74* 53*  ALKPHOS 153* 139* 133*  BILITOT 0.7 0.5 1.0    PT/INR Recent Labs    09/15/20 0407  LABPROT 16.2*  INR 1.4*   Hepatitis Panel No results for input(s): HEPBSAG, HCVAB, HEPAIGM, HEPBIGM in the last 72 hours.  Studies/Results: No results found.   Scheduled Meds: . aspirin EC  81 mg Oral Daily  . atorvastatin  20 mg Oral QPM  . calcium carbonate  500 mg of elemental calcium Oral Q breakfast  . cholecalciferol  2,000 Units Oral Daily  . citalopram  20 mg Oral Daily  . ferrous sulfate  325 mg Oral BID WC  . [START ON 09/18/2020] furosemide  40 mg Oral Daily  . multivitamin  1 tablet Oral BID  . pantoprazole (PROTONIX) IV  40 mg Intravenous Q12H  . PHENobarbital  64.8 mg Oral BID  . phenytoin  100 mg Oral TID PC & HS   Continuous Infusions: PRN Meds:.acetaminophen **OR** acetaminophen, albuterol, camphor-menthol, Muscle Rub, ondansetron **OR** ondansetron (ZOFRAN) IV, polyvinyl alcohol   ASSESMENT:   *    FOBT + macrocytic anemia EGD and colonoscopy 10/2018.  Benign nodular duodenal mucosa, H. pylori negative gastritis.  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  Examined portion of jejunum, esophagus, stomach normal.  Capsule endoscopy dropped. Hgb 7.1 >>  8.5.  No transfusions to date.  *    Chronic Plavix.  History drug-eluting stent 2017.  Plavix on hold, last dose 12/24. Cardiology, Dr. Stanford Breed has evaluated the patient and plans to resume 81 mg aspirin today but not to resume Plavix at any point.  PLAN   *   Await reading of capsule endoscopy.       Azucena Freed  09/17/2020, 10:24 AM Phone [phone]    Attending physician's note   I have taken an interval history, reviewed the chart and examined the patient. I agree with the Advanced Practitioner's note, impression and recommendations.   VCE: Neg distal to the jejunal tattoo. (Report sent for scanning)  No further bleeding.  HB stable.  Plan: -Trend CBC periodically (Q monthly as  outpt) and transfuse if needed. -Continue Protonix 40 mg p.o. QD -No Plavix per cardiology  Will sign off for now. FU GI as needed.    Carmell Austria, MD [facility] [phone]

## 2020-09-17 NOTE — Progress Notes (Signed)
CSW spoke with [place] at [facility]. They can accept tomorrow but pharmacy is closed over weekend for holiday so cannot accept over weekend. Covid tests are good for 48 hours.

## 2020-09-18 DIAGNOSIS — I5023 Acute on chronic systolic (congestive) heart failure: Secondary | ICD-10-CM | POA: Diagnosis not present

## 2020-09-18 DIAGNOSIS — I441 Atrioventricular block, second degree: Secondary | ICD-10-CM | POA: Diagnosis not present

## 2020-09-18 DIAGNOSIS — J811 Chronic pulmonary edema: Secondary | ICD-10-CM

## 2020-09-18 DIAGNOSIS — Z9981 Dependence on supplemental oxygen: Secondary | ICD-10-CM | POA: Diagnosis not present

## 2020-09-18 DIAGNOSIS — E785 Hyperlipidemia, unspecified: Secondary | ICD-10-CM | POA: Diagnosis not present

## 2020-09-18 DIAGNOSIS — Z7982 Long term (current) use of aspirin: Secondary | ICD-10-CM | POA: Diagnosis not present

## 2020-09-18 DIAGNOSIS — I252 Old myocardial infarction: Secondary | ICD-10-CM | POA: Diagnosis not present

## 2020-09-18 DIAGNOSIS — Z955 Presence of coronary angioplasty implant and graft: Secondary | ICD-10-CM | POA: Diagnosis not present

## 2020-09-18 DIAGNOSIS — R532 Functional quadriplegia: Secondary | ICD-10-CM | POA: Diagnosis not present

## 2020-09-18 DIAGNOSIS — J449 Chronic obstructive pulmonary disease, unspecified: Secondary | ICD-10-CM | POA: Diagnosis not present

## 2020-09-18 DIAGNOSIS — M6281 Muscle weakness (generalized): Secondary | ICD-10-CM | POA: Diagnosis not present

## 2020-09-18 DIAGNOSIS — D539 Nutritional anemia, unspecified: Secondary | ICD-10-CM | POA: Diagnosis not present

## 2020-09-18 DIAGNOSIS — G40909 Epilepsy, unspecified, not intractable, without status epilepticus: Secondary | ICD-10-CM | POA: Diagnosis not present

## 2020-09-18 DIAGNOSIS — K921 Melena: Secondary | ICD-10-CM | POA: Diagnosis not present

## 2020-09-18 DIAGNOSIS — J9611 Chronic respiratory failure with hypoxia: Secondary | ICD-10-CM | POA: Diagnosis not present

## 2020-09-18 DIAGNOSIS — K219 Gastro-esophageal reflux disease without esophagitis: Secondary | ICD-10-CM | POA: Diagnosis not present

## 2020-09-18 DIAGNOSIS — E7800 Pure hypercholesterolemia, unspecified: Secondary | ICD-10-CM | POA: Diagnosis not present

## 2020-09-18 DIAGNOSIS — Z86010 Personal history of colonic polyps: Secondary | ICD-10-CM | POA: Diagnosis not present

## 2020-09-18 DIAGNOSIS — F329 Major depressive disorder, single episode, unspecified: Secondary | ICD-10-CM | POA: Diagnosis not present

## 2020-09-18 DIAGNOSIS — I5033 Acute on chronic diastolic (congestive) heart failure: Secondary | ICD-10-CM | POA: Diagnosis not present

## 2020-09-18 DIAGNOSIS — I10 Essential (primary) hypertension: Secondary | ICD-10-CM | POA: Diagnosis not present

## 2020-09-18 DIAGNOSIS — I110 Hypertensive heart disease with heart failure: Secondary | ICD-10-CM | POA: Diagnosis not present

## 2020-09-18 DIAGNOSIS — R569 Unspecified convulsions: Secondary | ICD-10-CM | POA: Diagnosis not present

## 2020-09-18 DIAGNOSIS — R001 Bradycardia, unspecified: Secondary | ICD-10-CM | POA: Diagnosis not present

## 2020-09-18 DIAGNOSIS — I2510 Atherosclerotic heart disease of native coronary artery without angina pectoris: Secondary | ICD-10-CM | POA: Diagnosis not present

## 2020-09-18 DIAGNOSIS — E559 Vitamin D deficiency, unspecified: Secondary | ICD-10-CM | POA: Diagnosis not present

## 2020-09-18 DIAGNOSIS — Z9181 History of falling: Secondary | ICD-10-CM | POA: Diagnosis not present

## 2020-09-18 DIAGNOSIS — D649 Anemia, unspecified: Secondary | ICD-10-CM | POA: Diagnosis not present

## 2020-09-18 LAB — BASIC METABOLIC PANEL
Anion gap: 9 (ref 5–15)
BUN: 22 mg/dL (ref 8–23)
CO2: 27 mmol/L (ref 22–32)
Calcium: 8.5 mg/dL — ABNORMAL LOW (ref 8.9–10.3)
Chloride: 103 mmol/L (ref 98–111)
Creatinine, Ser: 0.89 mg/dL (ref 0.61–1.24)
GFR, Estimated: >60 mL/min (ref >60)
Glucose, Bld: 99 mg/dL (ref 70–99)
Potassium: 3.6 mmol/L (ref 3.5–5.1)
Sodium: 139 mmol/L (ref 135–145)

## 2020-09-18 LAB — SARS CORONAVIRUS 2 (TAT 6-24 HRS): SARS Coronavirus 2: NEGATIVE

## 2020-09-18 MED ORDER — PANTOPRAZOLE SODIUM 40 MG PO TBEC: 40.0000 mg | DELAYED_RELEASE_TABLET | Freq: Every day | ORAL | Status: DC

## 2020-09-18 MED ORDER — PANTOPRAZOLE SODIUM 40 MG PO TBEC: 40.0000 mg | DELAYED_RELEASE_TABLET | Freq: Every day | ORAL | 0 refills | Status: AC

## 2020-09-18 MED ORDER — FUROSEMIDE 40 MG PO TABS: 40.0000 mg | ORAL_TABLET | Freq: Every day | ORAL | 0 refills | Status: DC

## 2020-09-18 NOTE — Progress Notes (Signed)
Attempted to give report to facility multiple times, nobody answered. Pt is still here waiting for transportation.

## 2020-09-18 NOTE — Discharge Summary (Signed)
Physician Discharge Summary  Joshua [name] ONG:[id number] DOB: 01-05-1932 DOA: 09/12/2020  PCP: Bennie Pierini, FNP  Admit date: 09/12/2020 Discharge date: 09/18/2020  Admitted From: Home Disposition:  SNF  Recommendations for Outpatient Follow-up:  1. Follow up with PCP in 1-2 weeks 2. Please obtain BMP/CBC in one week  Home Health:No Equipment/Devices:None  Discharge Condition:Guarded CODE STATUS:Full Diet recommendation: Heart Healthy  Brief/Interim Summary:  86 y.o. male past medical history significant for intracerebral AV malformation with a history of a bleed seizure disorder treated with craniotomy 19 ED, IADL and chronic respiratory failure 3 to 4 L, bladder cancer Mobitz type I, pacemaker placement, GERD comes into the ED with progressive worsening dyspnea for the past 2-day an episode of black dark tarry stools, but he is on iron supplements at home.  Discharge Diagnoses:  Principal Problem:   Acute on chronic diastolic congestive heart failure (HCC) Active Problems:   GERD   Hypercholesterolemia   Essential hypertension   Seizures (HCC)   Macrocytic anemia   CAD (coronary artery disease)   Heme positive stool   Functional quadriplegia (HCC)   Acute heart failure (HCC)   SOB (shortness of breath)   Chronic pulmonary edema   Melena Acute on chronic diastolic heart failure: In October and EF showed 50% he was started on IV Lasix and Aldactone and lisinopril. Cardiology and pulmonary thought there was a component of UIP plus or minus low hemoglobin. His dyspnea is improved. He will go home on Lasix, continue daily weights and follow-up with cardiology as an outpatient.  Chronic respiratory failure with hypoxia secondary to IDL: There is a component of aspiration pneumonia he is now following treatment at home he did complete his course of antibiotics in house, he refused a repeat MBS. High-resolution CT scan showed interstitial lung disease evaluated by Dr.  Myrla Halsted who recommended to follow-up with them as an outpatient, his diet was changed to nectar thick which she has declined.  Heme positive stool/possible melena: Hemoglobin dropped to 7.1 he was transfused 1 unit of packed red blood cells. GI was consulted to perform an EGD on 09/14/2020 that showed gastritis in the duodenum and an angiodysplastic lesion, capsule endoscopy was performed and follow-up with GI as an outpatient. He will go home off Plavix and GI to dictate as an outpatient when to restart it.  Seizure disorder secondary to intracranial bleed: No changes made to his medication.  Microcytic anemia: He was transfused 1 unit of packed red blood cells.  History of CAD/STEMI with 2 drug-eluting stent placed to the RCA in 2017: Continue aspirin, GI and cardiology recommended to go home off Plavix still reevaluate as an outpatient.  Functional quadriplegia: Physical therapy evaluated the patient recommend skilled nursing facility.   Discharge Instructions  Discharge Instructions    Diet - low sodium heart healthy   Complete by: As directed    Increase activity slowly   Complete by: As directed      Allergies as of 09/18/2020      Reactions   Penicillins Rash   Has patient had a PCN reaction causing immediate rash, facial/tongue/throat swelling, SOB or lightheadedness with hypotension: ##Yes## Has patient had a PCN reaction causing severe rash involving mucus membranes or skin necrosis: No Has patient had a PCN reaction that required hospitalization No Has patient had a PCN reaction occurring within the last 10 years: No If all of the above answers are "NO", then may proceed with Cephalosporin use.   Prednisone    "  shaking and hands twitching"      Medication List    STOP taking these medications   azithromycin 250 MG tablet Commonly known as: ZITHROMAX   clopidogrel 75 MG tablet Commonly known as: PLAVIX   famotidine 20 MG tablet Commonly known as:  PEPCID     TAKE these medications   albuterol 108 (90 Base) MCG/ACT inhaler Commonly known as: Ventolin HFA Inhale 1-2 puffs into the lungs every 6 (six) hours as needed for wheezing or shortness of breath.   aspirin EC 81 MG tablet Take 81 mg by mouth daily. Swallow whole.   atorvastatin 20 MG tablet Commonly known as: LIPITOR TAKE 1 TABLET DAILY   calcium carbonate 1500 (600 Ca) MG Tabs tablet Commonly known as: OSCAL Take 600 mg of elemental calcium by mouth daily with breakfast.   citalopram 20 MG tablet Commonly known as: CELEXA TAKE 1 TABLET DAILY   ferrous sulfate 325 (65 FE) MG tablet Take 1 tablet (325 mg total) by mouth daily with breakfast.   furosemide 40 MG tablet Commonly known as: Lasix Take 0.5 tablets (20 mg total) by mouth daily.   MAGNESIUM PO Take 1 tablet by mouth at bedtime.   pantoprazole 40 MG tablet Commonly known as: PROTONIX Take 1 tablet (40 mg total) by mouth daily. Start taking on: September 19, 2020   PHENobarbital 64.8 MG tablet Commonly known as: LUMINAL Take 1 tablet (64.8 mg total) by mouth 2 (two) times daily.   phenytoin 100 MG ER capsule Commonly known as: DILANTIN Take 1 capsule (100 mg total) by mouth in the morning, at noon, in the evening, and at bedtime.   polyvinyl alcohol 1.4 % ophthalmic solution Commonly known as: LIQUIFILM TEARS Place 2 drops into both eyes daily as needed for dry eyes.   predniSONE 20 MG tablet Commonly known as: Deltasone Take 1 tablet (20 mg total) by mouth daily with breakfast.   PreserVision AREDS 2 Caps Take 1 capsule by mouth 2 (two) times daily.   spironolactone 25 MG tablet Commonly known as: ALDACTONE Take 1 tablet (25 mg total) by mouth daily.   Vitamin D3 50 MCG (2000 UT) capsule Take 2,000 Units by mouth daily.       Follow-up Information    Marshell Garfinkel, MD Follow up on 10/02/2020.   Specialty: Pulmonary Disease Why: 2PM, please come 15 minutes prior to your appt  time. Contact information: [address] [place] [address] [phone]              Allergies  Allergen Reactions  . Penicillins Rash    Has patient had a PCN reaction causing immediate rash, facial/tongue/throat swelling, SOB or lightheadedness with hypotension: ##Yes## Has patient had a PCN reaction causing severe rash involving mucus membranes or skin necrosis: No Has patient had a PCN reaction that required hospitalization No Has patient had a PCN reaction occurring within the last 10 years: No If all of the above answers are "NO", then may proceed with Cephalosporin use.   . Prednisone     "shaking and hands twitching"     Consultations:  Cardiology  Gastroenterology  Pulmonary and critical care   Procedures/Studies: DG Chest 2 View  Result Date: 08/20/2020 CLINICAL DATA:  Shortness of breath. EXAM: CHEST - 2 VIEW COMPARISON:  06/26/2020. FINDINGS: Cardiac pacer with lead tips over the right atrium right ventricle. Cardiomegaly. Increase interstitial markings noted bilaterally suggesting interstitial edema and or pneumonitis. No pleural effusion or pneumothorax. Thoracic spine  scoliosis and degenerative change. Stable midthoracic vertebral body compression fracture. IMPRESSION: 1. Cardiac pacer with lead tips over the right atrium and right ventricle. Cardiomegaly. 2. Increase interstitial markings noted bilaterally suggesting interstitial edema and or pneumonitis. Electronically Signed   By: Marcello Moores  Register   On: 08/20/2020 12:13   CT ABDOMEN PELVIS W CONTRAST  Result Date: 09/14/2020 CLINICAL DATA:  Anemia prostate cancer EXAM: CT ABDOMEN AND PELVIS WITH CONTRAST TECHNIQUE: Multidetector CT imaging of the abdomen and pelvis was performed using the standard protocol following bolus administration of intravenous contrast. CONTRAST:  168mL OMNIPAQUE IOHEXOL 300 MG/ML  SOLN COMPARISON:  CT 09/24/2009, MRI 04/24/2012 FINDINGS: Lower chest: Moderate right  and small left pleural effusion. Cardiomegaly with partially visualized intracardiac pacing leads. Mild subpleural fibrosis. No acute consolidation. Dependent atelectasis in the lower lobes. Hepatobiliary: Small gallstones. No focal hepatic abnormality. No biliary dilatation. Pancreas: Unremarkable. No pancreatic ductal dilatation or surrounding inflammatory changes. Spleen: Normal in size without focal abnormality. Adrenals/Urinary Tract: Adrenal glands are within normal limits. Kidneys show no hydronephrosis. Cysts within the left kidney. Delayed nephrogram with poor excretion of contrast on delayed views. The urinary bladder is unremarkable Stomach/Bowel: The stomach is nonenlarged. No dilated small bowel. No acute bowel wall thickening. Negative appendix. Vascular/Lymphatic: Moderate aortic atherosclerosis. No aneurysm. No suspicious nodes. Reproductive: Post treatment changes of the prostate. Other: Mild presacral soft tissue stranding presumably due to post treatment change. Edema within the subcutaneous soft tissues consistent with anasarca. No free air or significant free fluid. Musculoskeletal: Degenerative changes of the spine. No acute or suspicious osseous lesions are visualized. Chronic appearing deformity of the right inferior pubic ramus. IMPRESSION: 1. Post treatment changes of the prostate gland. No CT evidence for metastatic disease within the abdomen or pelvis. 2. Moderate right and small left pleural effusions. Cardiomegaly. Generalized subcutaneous edema consistent with anasarca. 3. Cholelithiasis. 4. Delayed nephrogram, consistent with decreased renal function, recommend correlation with appropriate laboratory studies. Aortic Atherosclerosis (ICD10-I70.0). Electronically Signed   By: Donavan Foil M.D.   On: 09/14/2020 22:56   CT Chest High Resolution  Result Date: 09/14/2020 CLINICAL DATA:  86 year old male with history of respiratory failure. Worsening shortness of breath. Evaluate for  interstitial lung disease. EXAM: CT CHEST WITHOUT CONTRAST TECHNIQUE: Multidetector CT imaging of the chest was performed following the standard protocol without intravenous contrast. High resolution imaging of the lungs, as well as inspiratory and expiratory imaging, was performed. COMPARISON:  Chest CT 06/25/2020. FINDINGS: Cardiovascular: Heart size is mildly enlarged. There is no significant pericardial fluid, thickening or pericardial calcification. There is aortic atherosclerosis, as well as atherosclerosis of the great vessels of the mediastinum and the coronary arteries, including calcified atherosclerotic plaque in the left main, left anterior descending, left circumflex and right coronary arteries. Calcifications of the aortic valve and mitral annulus. Right-sided pacemaker/AICD with lead tips terminating in the right atrium and right ventricle. Mediastinum/Nodes: No pathologically enlarged mediastinal or hilar lymph nodes. Please note that accurate exclusion of hilar adenopathy is limited on noncontrast CT scans. Esophagus is unremarkable in appearance. No axillary lymphadenopathy. Lungs/Pleura: Moderate right and small left pleural effusions lying dependently with some associated passive subsegmental atelectasis in the lower lobes of the lungs bilaterally. High-resolution imaging is limited by considerable patient respiratory motion. With these limitations in mind, there are some patchy peripheral predominant areas of ground-glass attenuation, septal thickening, cylindrical bronchiectasis and peripheral bronchiolectasis in the lungs bilaterally, most evident throughout the mid to lower lungs. No frank honeycombing. Diffuse bronchial wall thickening  with mild centrilobular and paraseptal emphysema. Inspiratory and expiratory imaging is unremarkable. No acute consolidative airspace disease. No definite suspicious appearing pulmonary nodules or masses are noted. Upper Abdomen: Aortic atherosclerosis.  Musculoskeletal: There are no aggressive appearing lytic or blastic lesions noted in the visualized portions of the skeleton. IMPRESSION: 1. The appearance of the lungs is compatible with interstitial lung disease, with a spectrum of findings considered probable usual interstitial pneumonia (UIP) per current ATS guidelines. Repeat high-resolution chest CT is recommended in 12 months to assess for temporal changes in the appearance of the lung parenchyma. 2. Diffuse bronchial wall thickening with mild centrilobular and paraseptal emphysema; imaging findings suggestive of underlying COPD. 3. Aortic atherosclerosis, in addition to left main and 3 vessel coronary artery disease. 4. There are calcifications of the aortic valve and mitral annulus. Echocardiographic correlation for evaluation of potential valvular dysfunction may be warranted if clinically indicated. 5. Mild cardiomegaly. Aortic Atherosclerosis (ICD10-I70.0) and Emphysema (ICD10-J43.9). Electronically Signed   By: Vinnie Langton M.D.   On: 09/14/2020 13:13   DG CHEST PORT 1 VIEW  Result Date: 09/13/2020 CLINICAL DATA:  Pulmonary edema EXAM: PORTABLE CHEST - 1 VIEW COMPARISON:  the previous day's study FINDINGS: Mild interstitial edema or infiltrates, improved since previous. Improved left retrocardiac aeration. Stable cardiomegaly. Aortic Atherosclerosis (ICD10-170.0). Stable left subclavian dual lead transvenous pacemaker. Blunting of lateral costophrenic angles as before.  No pneumothorax. Mild diffuse osteopenia. IMPRESSION: Interval improvement in bilateral edema/infiltrates. Electronically Signed   By: Lucrezia Europe M.D.   On: 09/13/2020 08:16   DG Chest Portable 1 View  Result Date: 09/12/2020 CLINICAL DATA:  Initial evaluation for acute shortness of breath. EXAM: PORTABLE CHEST 1 VIEW COMPARISON:  Prior radiograph from 08/20/2020. FINDINGS: Right-sided pacemaker/AICD in place. Cardiomegaly, stable. Mediastinal silhouette normal. Aortic  atherosclerosis. Lungs hypoinflated. Small layering bilateral pleural effusions with associated bibasilar atelectasis. Diffuse prominence of the pulmonary vasculature and interstitial markings, favored to reflect mild pulmonary interstitial congestion/edema. Atypical pneumonitis could also have this appearance. No pneumothorax. No acute osseous finding. IMPRESSION: 1. Cardiomegaly with diffuse prominence of the pulmonary vasculature and interstitial markings, favored to reflect mild pulmonary interstitial congestion/edema. Possible atypical pneumonitis could be considered in the correct clinical setting. 2. Superimposed small layering bilateral pleural effusions with associated bibasilar atelectasis. 3.  Aortic Atherosclerosis (ICD10-I70.0). Electronically Signed   By: Jeannine Boga M.D.   On: 09/12/2020 00:32   CUP PACEART REMOTE DEVICE CHECK  Result Date: 09/09/2020 Scheduled remote reviewed. Normal device function.  26 AT/AF events; longest logged 16 minutes 29 seconds; EGMs suggest A. Flutter vs AT - routing to triage for further evaluation. No history noted in EPIC. Next remote 91 days. HB   (Echo, Carotid, EGD, Colonoscopy, ERCP)    Subjective: No new complaints  Discharge Exam: Vitals:   09/18/20 0406 09/18/20 0812  BP: (!) 108/50 (!) 112/58  Pulse: 60 67  Resp: 15 19  Temp: 98.3 F (36.8 C) 100.2 F (37.9 C)  SpO2: 100% 97%   Vitals:   09/17/20 1134 09/17/20 1931 09/18/20 0406 09/18/20 0812  BP: (!) 109/59 (!) 130/54 (!) 108/50 (!) 112/58  Pulse: 74 69 60 67  Resp: 16 16 15 19   Temp: 97.9 F (36.6 C) 98.3 F (36.8 C) 98.3 F (36.8 C) 100.2 F (37.9 C)  TempSrc: Oral Oral Oral Oral  SpO2: 96%  100% 97%  Weight:   79.9 kg   Height:        General: Pt is alert, awake, not in  acute distress Cardiovascular: RRR, S1/S2 +, no rubs, no gallops Respiratory: CTA bilaterally, no wheezing, no rhonchi Abdominal: Soft, NT, ND, bowel sounds + Extremities: no edema, no  cyanosis    The results of significant diagnostics from this hospitalization (including imaging, microbiology, ancillary and laboratory) are listed below for reference.     Microbiology: Recent Results (from the past 240 hour(s))  Resp Panel by RT-PCR (Flu A&B, Covid) Nasopharyngeal Swab     Status: None   Collection Time: 09/12/20 12:14 AM   Specimen: Nasopharyngeal Swab; Nasopharyngeal(NP) swabs in vial transport medium  Result Value Ref Range Status   SARS Coronavirus 2 by RT PCR NEGATIVE NEGATIVE Final    Comment: (NOTE) SARS-CoV-2 target nucleic acids are NOT DETECTED.  The SARS-CoV-2 RNA is generally detectable in upper respiratory specimens during the acute phase of infection. The lowest concentration of SARS-CoV-2 viral copies this assay can detect is 138 copies/mL. A negative result does not preclude SARS-Cov-2 infection and should not be used as the sole basis for treatment or other patient management decisions. A negative result may occur with  improper specimen collection/handling, submission of specimen other than nasopharyngeal swab, presence of viral mutation(s) within the areas targeted by this assay, and inadequate number of viral copies(<138 copies/mL). A negative result must be combined with clinical observations, patient history, and epidemiological information. The expected result is Negative.  Fact Sheet for Patients:  BloggerCourse.com  Fact Sheet for Healthcare Providers:  SeriousBroker.it  This test is no t yet approved or cleared by the [place] FDA and  has been authorized for detection and/or diagnosis of SARS-CoV-2 by FDA under an Emergency Use Authorization (EUA). This EUA will remain  in effect (meaning this test can be used) for the duration of the COVID-19 declaration under Section 564(b)(1) of the Act, 21 U.S.C.section 360bbb-3(b)(1), unless the authorization is terminated  or revoked  sooner.       Influenza A by PCR NEGATIVE NEGATIVE Final   Influenza B by PCR NEGATIVE NEGATIVE Final    Comment: (NOTE) The Xpert Xpress SARS-CoV-2/FLU/RSV plus assay is intended as an aid in the diagnosis of influenza from Nasopharyngeal swab specimens and should not be used as a sole basis for treatment. Nasal washings and aspirates are unacceptable for Xpert Xpress SARS-CoV-2/FLU/RSV testing.  Fact Sheet for Patients: BloggerCourse.com  Fact Sheet for Healthcare Providers: SeriousBroker.it  This test is not yet approved or cleared by the [place] FDA and has been authorized for detection and/or diagnosis of SARS-CoV-2 by FDA under an Emergency Use Authorization (EUA). This EUA will remain in effect (meaning this test can be used) for the duration of the COVID-19 declaration under Section 564(b)(1) of the Act, 21 U.S.C. section 360bbb-3(b)(1), unless the authorization is terminated or revoked.  Performed at [facility], [address] [place], [place] [address]   MRSA PCR Screening     Status: None   Collection Time: 09/12/20  4:15 PM   Specimen: Nasal Mucosa; Nasopharyngeal  Result Value Ref Range Status   MRSA by PCR NEGATIVE NEGATIVE Final    Comment:        The GeneXpert MRSA Assay (FDA approved for NASAL specimens only), is one component of a comprehensive MRSA colonization surveillance program. It is not intended to diagnose MRSA infection nor to guide or monitor treatment for MRSA infections. Performed at [facility], [address], [place], [place] [address]      Labs: BNP (last 3 results) Recent Labs    09/13/20  [id number] 09/14/20 0214 09/15/20 0407  BNP 866.0* 849.9* [id number]*   Basic Metabolic Panel: Recent Labs  Lab 09/13/20 0337 09/14/20 0214 09/15/20 0407 09/16/20 0355 09/17/20 0252 09/18/20 0323  NA 140 139 136 138 137 139  K 3.6 3.5 3.7 3.4* 4.5 3.6  CL 103 102 99 102 103 103  CO2  26 26 26 27 25 27   GLUCOSE 104* 167* 121* 99 104* 99  BUN 42* 39* 35* 30* 26* 22  CREATININE 0.88 1.05 1.06 1.02 0.93 0.89  CALCIUM 8.6* 8.7* 8.5* 8.6* 8.8* 8.5*  MG 2.2 2.0 2.0 1.9 1.9  --    Liver Function Tests: Recent Labs  Lab 09/13/20 0337 09/14/20 0214 09/15/20 0407 09/16/20 0355 09/17/20 0252  AST 102* 83* 61* 42* 41  ALT 109* 115* 94* 74* 53*  ALKPHOS 134* 144* 153* 139* 133*  BILITOT 0.8 0.8 0.7 0.5 1.0  PROT 5.7* 5.6* 5.6* 5.4* 5.3*  ALBUMIN 3.2* 3.0* 3.0* 2.8* 2.9*   No results for input(s): LIPASE, AMYLASE in the last 168 hours. No results for input(s): AMMONIA in the last 168 hours. CBC: Recent Labs  Lab 09/12/20 0015 09/12/20 0644 09/13/20 0337 09/14/20 0214 09/15/20 0407 09/16/20 0355 09/17/20 0456  WBC 13.2*   < > 9.6 11.8* 9.4 8.5 10.4  NEUTROABS 9.8*  --   --   --   --   --   --   HGB 8.3*   < > 7.3* 8.1* 8.0* 7.8* 8.5*  HCT 26.9*   < > 23.8* 25.0* 25.1* 25.3* 26.6*  MCV 105.5*   < > 104.4* 102.0* 100.8* 102.0* 100.4*  PLT 318   < > 263 263 268 280 333   < > = values in this interval not displayed.   Cardiac Enzymes: No results for input(s): CKTOTAL, CKMB, CKMBINDEX, TROPONINI in the last 168 hours. BNP: Invalid input(s): POCBNP CBG: No results for input(s): GLUCAP in the last 168 hours. D-Dimer No results for input(s): DDIMER in the last 72 hours. Hgb A1c No results for input(s): HGBA1C in the last 72 hours. Lipid Profile No results for input(s): CHOL, HDL, LDLCALC, TRIG, CHOLHDL, LDLDIRECT in the last 72 hours. Thyroid function studies No results for input(s): TSH, T4TOTAL, T3FREE, THYROIDAB in the last 72 hours.  Invalid input(s): FREET3 Anemia work up No results for input(s): VITAMINB12, FOLATE, FERRITIN, TIBC, IRON, RETICCTPCT in the last 72 hours. Urinalysis    Component Value Date/Time   COLORURINE YELLOW 06/01/2016 1347   APPEARANCEUR CLOUDY (A) 06/01/2016 1347   APPEARANCEUR Clear 01/20/2016 0959   LABSPEC 1.026 06/01/2016  1347   PHURINE 7.0 06/01/2016 1347   GLUCOSEU NEGATIVE 06/01/2016 1347   HGBUR NEGATIVE 06/01/2016 1347   BILIRUBINUR NEGATIVE 06/01/2016 1347   BILIRUBINUR Negative 01/20/2016 0959   KETONESUR NEGATIVE 06/01/2016 1347   PROTEINUR NEGATIVE 06/01/2016 1347   UROBILINOGEN negative 09/10/2014 1233   UROBILINOGEN 0.2 09/23/2009 2141   NITRITE NEGATIVE 06/01/2016 1347   LEUKOCYTESUR TRACE (A) 06/01/2016 1347   LEUKOCYTESUR Negative 01/20/2016 0959   Sepsis Labs Invalid input(s): PROCALCITONIN,  WBC,  LACTICIDVEN Microbiology Recent Results (from the past 240 hour(s))  Resp Panel by RT-PCR (Flu A&B, Covid) Nasopharyngeal Swab     Status: None   Collection Time: 09/12/20 12:14 AM   Specimen: Nasopharyngeal Swab; Nasopharyngeal(NP) swabs in vial transport medium  Result Value Ref Range Status   SARS Coronavirus 2 by RT PCR NEGATIVE NEGATIVE Final    Comment: (NOTE) SARS-CoV-2 target nucleic acids are NOT DETECTED.  The SARS-CoV-2 RNA is generally detectable in upper respiratory specimens during the acute phase of infection. The lowest concentration of SARS-CoV-2 viral copies this assay can detect is 138 copies/mL. A negative result does not preclude SARS-Cov-2 infection and should not be used as the sole basis for treatment or other patient management decisions. A negative result may occur with  improper specimen collection/handling, submission of specimen other than nasopharyngeal swab, presence of viral mutation(s) within the areas targeted by this assay, and inadequate number of viral copies(<138 copies/mL). A negative result must be combined with clinical observations, patient history, and epidemiological information. The expected result is Negative.  Fact Sheet for Patients:  EntrepreneurPulse.com.au  Fact Sheet for Healthcare Providers:  IncredibleEmployment.be  This test is no t yet approved or cleared by the [place] FDA and  has  been authorized for detection and/or diagnosis of SARS-CoV-2 by FDA under an Emergency Use Authorization (EUA). This EUA will remain  in effect (meaning this test can be used) for the duration of the COVID-19 declaration under Section 564(b)(1) of the Act, 21 U.S.C.section 360bbb-3(b)(1), unless the authorization is terminated  or revoked sooner.       Influenza A by PCR NEGATIVE NEGATIVE Final   Influenza B by PCR NEGATIVE NEGATIVE Final    Comment: (NOTE) The Xpert Xpress SARS-CoV-2/FLU/RSV plus assay is intended as an aid in the diagnosis of influenza from Nasopharyngeal swab specimens and should not be used as a sole basis for treatment. Nasal washings and aspirates are unacceptable for Xpert Xpress SARS-CoV-2/FLU/RSV testing.  Fact Sheet for Patients: EntrepreneurPulse.com.au  Fact Sheet for Healthcare Providers: IncredibleEmployment.be  This test is not yet approved or cleared by the [place] FDA and has been authorized for detection and/or diagnosis of SARS-CoV-2 by FDA under an Emergency Use Authorization (EUA). This EUA will remain in effect (meaning this test can be used) for the duration of the COVID-19 declaration under Section 564(b)(1) of the Act, 21 U.S.C. section 360bbb-3(b)(1), unless the authorization is terminated or revoked.  Performed at [facility], [address], [place], [place] [address]   MRSA PCR Screening     Status: None   Collection Time: 09/12/20  4:15 PM   Specimen: Nasal Mucosa; Nasopharyngeal  Result Value Ref Range Status   MRSA by PCR NEGATIVE NEGATIVE Final    Comment:        The GeneXpert MRSA Assay (FDA approved for NASAL specimens only), is one component of a comprehensive MRSA colonization surveillance program. It is not intended to diagnose MRSA infection nor to guide or monitor treatment for MRSA infections. Performed at [facility], [address], [place], [place] [address]       Time coordinating discharge: Over 30 minutes  SIGNED:   Charlynne Cousins, MD  [facility] 09/18/2020, 8:56 AM Pager   If 7PM-7AM, please contact night-coverage [email or web address] Password [email or web address]

## 2020-09-18 NOTE — Progress Notes (Deleted)
[facility] 3E06 Civil engineer, contracting [facility]) Hospital Liaison RN note:  Notified by Harvest Dark of patient/family request for [facility] Palliative services at [facility] at discharge.  [facility] Palliative team will follow up with patient.  Please call with any hospice or palliative related questions.   Thank you for this referral.  Haynes Bast, BSN, RN [facility] Liaison [phone]

## 2020-09-18 NOTE — Progress Notes (Signed)
Pt does not c/o any pain. His appetite is poor but oral intake is fair. Pt will be going [facility] later on tonight. Pt's needs are met and safety measures are maintained. As of when this note was documented, Pt is still here on 3E at [facility].

## 2020-09-18 NOTE — TOC Transition Note (Addendum)
Transition of Care [facility]) - CM/SW Discharge Note   Patient Details  Name: Joshua [name] MRN: [id number] Date of Birth: August 15, 1932  Transition of Care [facility]) CM/SW Contact:  Erin Sons, LCSW Phone Number: 09/18/2020, 4:34 PM   Clinical Narrative:     *Pt's son Sharon Seller wishes to be notified when [facility] picks pt up*  Patient will DC to: [facility] SNF Anticipated DC date: 09/18/20 Family notified: Catalina Gravel Son Transport by: Sharin Mons   Per MD patient ready for DC to [facility] SNF . RN, patient, patient's family, and facility notified of DC. Discharge Summary and FL2 sent to facility. RN to call report prior to discharge [phone] Room 39). DC packet on chart. Ambulance transport requested for patient.   CSW will sign off for now as social work intervention is no longer needed. Please consult [place] again if new needs arise.   Final next level of care: Skilled Nursing Facility Barriers to Discharge: No Barriers Identified   Patient Goals and CMS Choice Patient states their goals for this hospitalization and ongoing recovery are:: SNF CMS Medicare.gov Compare Post Acute Care list provided to:: Patient Choice offered to / list presented to : Patient  Discharge Placement              Patient chooses bed at: [facility] Patient to be transferred to facility by: [facility] Name of family member notified: Catalina Gravel Patient and family notified of of transfer: 09/18/20  Discharge Plan and Services In-house Referral: NA Discharge Planning Services: NA Post Acute Care Choice: NA          DME Arranged: N/A DME Agency: NA       HH Arranged: NA HH Agency: NA        Social Determinants of Health (SDOH) Interventions     Readmission Risk Interventions Readmission Risk Prevention Plan 09/13/2020  Transportation Screening Complete  PCP or Specialist Appt within 3-5 Days Not Complete  Not Complete comments Will need appt closer to discharge  HRI or Home Care  Consult Complete  Social Work Consult for Recovery Care Planning/Counseling Complete  Palliative Care Screening Not Applicable  Medication Review Oceanographer) Complete  Some recent data might be hidden

## 2020-09-18 NOTE — Progress Notes (Signed)
IV consult was put in, IV nurse states due to the policy that the IV team does not put in an IV access due to the Pt not receiving  Anything intravenously.

## 2020-09-19 NOTE — Progress Notes (Deleted)
Cardiology Office Note:    Date:  09/19/2020   ID:  [name], DOB 1931-11-24, MRN [id number]  PCP:  Chevis Pretty, FNP  Cardiologist:  Sinclair Grooms, MD   Referring MD: Hassell Done, Mary-Margaret, *   No chief complaint on file.   History of Present Illness:    Joshua [name] is a 87 y.o. 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   ***  Past Medical History:  Diagnosis Date  . 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 Arthritis   . Bilateral lower extremity edema   . CAD (coronary artery disease) cardiologist--  dr Daneen Schick   a. 04/27/2016 PCI with DES to RCA with 50% ostial to 60% segmental mid to distal left main, and 90% thrombus filled ostial to proximal circumflex, with distal right coronary filled by collaterals from left-to-right  . COPD with emphysema ([place])   . Dyspnea on exertion   . ED (erectile dysfunction)   . First degree AV block   . GERD (gastroesophageal reflux disease)   . History of adenomatous polyp of colon    09-22-2008  . History of bladder cancer    s/p  resection bladder tumor 04-19-2016  non-invasive low-grade urothelial carcinoma  . History of prostate cancer urologist-  dr Jeffie Pollock-- last PSA 0.02 (summer 2017)   dx 02/ 2013-- Stage T2a, Gleason 7, PSA 1.58--  s/p  radioactive prostate seed implants 01-19-2012  . History of ST elevation myocardial infarction (STEMI)    04-27-2016  . Hyperlipidemia   . Hypertension   . Macular degeneration   . Presence of permanent cardiac pacemaker   . S/P drug eluting coronary stent placement    04-27-2016    . Second degree Mobitz I AV block    occasional w/ first degree heart block  per cardiologist note by dr Daneen Schick  . Seizures ([place]) per pt son -- no seizure's since 1980   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  arteriorvenous cerebral malformation s/p  craniotomy 1980  . Vitamin D deficiency     Past Surgical History:  Procedure Laterality Date  . APPENDECTOMY  2011  . BLEPHAROPLASTY Bilateral revision 02-17-2016  . CARDIAC CATHETERIZATION N/A 04/27/2016   Procedure: Left Heart Cath and Coronary Angiography;  Surgeon: Belva Crome, MD;  Location: [place] [facility];  Service: Cardiovascular;  Laterality: N/A;  acute inferior wall STEMI ;  ostCx to midCx 90%,  ost1st Mrg to 1st Mrg 50%,  mid to distal RCA 100%,  pRCA 80%,  LVEF 45-50% w/ inferobasal akinesis  . CARDIAC CATHETERIZATION N/A 04/27/2016   Procedure: Temporary Pacemaker;  Surgeon: Belva Crome, MD;  Location: [place] [facility];  Service: Cardiovascular;  Laterality: N/A;  mobitz 2  second degree HB w/ heart rate 29bpm  . CARDIAC CATHETERIZATION N/A 04/27/2016   Procedure: Coronary Stent Intervention;  Surgeon: Belva Crome, MD;  Location: [place] [facility];  Service: Cardiovascular;  Laterality: N/A; DES x1 to  Proximal RCA;  DES x1 to Mid RCA  . CATARACT EXTRACTION W/ INTRAOCULAR LENS  IMPLANT, BILATERAL  2016  approx.  Marland Kitchen  CRANIOTOMY  1980   intracranial bleed from arteriorvenous malformation (left parietal)  . CYSTOSCOPY  01/19/2012   Procedure: CYSTOSCOPY;  Surgeon: Malka So, MD;  Location: [facility];  Service: Urology;;  no seeds found in bladder  . CYSTOSCOPY W/ RETROGRADES Bilateral 04/19/2016   Procedure: CYSTOSCOPY WITH BILATERAL RETROGRADE PYELOGRAM TRANSURETHRAL RESECTION OF BLADDER TUMOR ;  Surgeon: [name], MD;  Location: [facility];  Service: Urology;  Laterality: Bilateral;  . CYSTOSCOPY WITH BIOPSY N/A 11/10/2016   Procedure: CYSTOSCOPY WITH BIOPSY AND FULGURATION;  Surgeon: [name],  MD;  Location: [facility];  Service: Urology;  Laterality: N/A;  . ENTEROSCOPY N/A 09/16/2020   Procedure: ENTEROSCOPY;  Surgeon: Jackquline Denmark, MD;  Location: [facility] ENDOSCOPY;  Service: Endoscopy;  Laterality: N/A;  . GIVENS CAPSULE STUDY N/A 09/16/2020   Procedure: GIVENS CAPSULE STUDY;  Surgeon: Jackquline Denmark, MD;  Location: [facility] ENDOSCOPY;  Service: Endoscopy;  Laterality: N/A;  . HOT HEMOSTASIS N/A 09/16/2020   Procedure: HOT HEMOSTASIS (ARGON PLASMA COAGULATION/BICAP);  Surgeon: Jackquline Denmark, MD;  Location: [facility] ENDOSCOPY;  Service: Endoscopy;  Laterality: N/A;  . INSERT / REPLACE / REMOVE PACEMAKER  06/15/2017  . LEAD EXTRACTION N/A 01/22/2018   Procedure: LEAD EXTRACTION;  Surgeon: Evans Lance, MD;  Location: [place] [facility];  Service: Cardiovascular;  Laterality: N/A;  . PACEMAKER IMPLANT N/A 06/15/2017   Procedure: Pacemaker Implant;  Surgeon: [name], MD;  Location: [place] [facility];  Service: Cardiovascular;  Laterality: N/A;  . PACEMAKER IMPLANT N/A 01/25/2018   Procedure: PACEMAKER IMPLANT;  Surgeon: Evans Lance, MD;  Location: [place] [facility];  Service: Cardiovascular;  Laterality: N/A;  . PPM GENERATOR REMOVAL N/A 01/22/2018   Procedure: PPM GENERATOR REMOVAL;  Surgeon: Evans Lance, MD;  Location: [place] [facility];  Service: Cardiovascular;  Laterality: N/A;  . RADIOACTIVE SEED IMPLANT  01/19/2012   Procedure: RADIOACTIVE SEED IMPLANT;  Surgeon: Malka So, MD;  Location: [facility];  Service: Urology;  Laterality: N/A;  68 seeds implanted  . SUBMUCOSAL TATTOO INJECTION  09/16/2020   Procedure: SUBMUCOSAL TATTOO INJECTION;  Surgeon: Jackquline Denmark, MD;  Location: [facility] ENDOSCOPY;  Service: Endoscopy;;  . TRANSTHORACIC ECHOCARDIOGRAM  06/02/2016   ef 60-65%/  trivial MR/  mild TR    Current Medications: No outpatient medications have been marked as taking for the 09/23/20 encounter (Appointment) with Belva Crome, MD.      Allergies:   Penicillins and Prednisone   Social History   Socioeconomic History  . Marital status: Widowed    Spouse name: Not on file  . Number of children: 2  . Years of education: 1 year of college  . Highest education level: Not on file  Occupational History  . Occupation: Retired  Tobacco Use  . Smoking status: Former Smoker    Packs/day: 2.00    Years: 15.00    Pack years: 30.00    Quit date: 01/15/1973    Years since quitting: 47.7  . Smokeless tobacco: Never Used  Vaping Use  . Vaping Use: Never used  Substance and Sexual Activity  . Alcohol use: Yes    Comment: occasionally  . Drug use: No  . Sexual activity: Not Currently  Other Topics Concern  . Not on file  Social History Narrative   Lives at home alone. Has contact with son daily and has dinner with him weekly. [place]    Social Determinants of  Health   Financial Resource Strain: Not on file  Food Insecurity: Not on file  Transportation Needs: Not on file  Physical Activity: Not on file  Stress: Not on file  Social Connections: Not on file     Family History: The patient's family history includes Emphysema in his father and mother; Stroke in his mother.  ROS:   Please see the history of present illness.    *** All other systems reviewed and are negative.  EKGs/Labs/Other Studies Reviewed:    The following studies were reviewed today: ECHOCARDIOGRAM 07/2020: IMPRESSIONS    1. Left ventricular ejection fraction, by estimation, is 50 to 55%. The  left ventricle has low normal function. The left ventricle has no regional  wall motion abnormalities. There is mild concentric left ventricular  hypertrophy. Left ventricular  diastolic parameters are consistent with Grade I diastolic dysfunction  (impaired relaxation).  2. Right ventricular systolic function is mildly reduced. The right  ventricular size is normal. There is moderately elevated pulmonary artery  systolic  pressure.  3. Right atrial size was mildly dilated.  4. The mitral valve is normal in structure. Mild mitral valve  regurgitation.  5. The aortic valve is tricuspid. There is mild calcification of the  aortic valve. There is mild thickening of the aortic valve. Aortic valve  regurgitation is not visualized.  6. Aortic dilatation noted. There is mild dilatation of the aortic root,  measuring 37 mm.  7. The inferior vena cava is dilated in size with >50% respiratory  variability, suggesting right atrial pressure of 8 mmHg.   Comparison(s): Compared to prior TTE in 2017, the LVEF appears low normal  with EF 50-55%. RV systolic function is mildly depressed. Otherwise no  significant changes.   EKG:  EKG ***  Recent Labs: 09/15/2020: B Natriuretic Peptide 976.5 09/17/2020: ALT 53; Hemoglobin 8.5; Magnesium 1.9; Platelets 333 09/18/2020: BUN 22; Creatinine, Ser 0.89; Potassium 3.6; Sodium 139  Recent Lipid Panel    Component Value Date/Time   CHOL 142 11/25/2019 1302   TRIG 83 11/25/2019 1302   TRIG 85 01/20/2016 0958   HDL 54 11/25/2019 1302   HDL 69 01/20/2016 0958   CHOLHDL 2.6 11/25/2019 1302   CHOLHDL 3.3 04/27/2016 1133   VLDL 16 04/27/2016 1133   LDLCALC 72 11/25/2019 1302   LDLCALC 99 05/21/2014 1101    Physical Exam:    VS:  There were no vitals taken for this visit.    Wt Readings from Last 3 Encounters:  09/18/20 176 lb 2.4 oz (79.9 kg)  08/20/20 170 lb (77.1 kg)  07/21/20 174 lb 12.8 oz (79.3 kg)     GEN: ***. No acute distress HEENT: Normal NECK: No JVD. LYMPHATICS: No lymphadenopathy CARDIAC: *** murmur. RRR *** gallop, or edema. VASCULAR: *** Normal Pulses. No bruits. RESPIRATORY:  Clear to auscultation without rales, wheezing or rhonchi  ABDOMEN: Soft, non-tender, non-distended, No pulsatile mass, MUSCULOSKELETAL: No deformity  SKIN: Warm and dry NEUROLOGIC:  Alert and oriented x 3 PSYCHIATRIC:  Normal affect   ASSESSMENT:    1. Acute on  chronic combined systolic and diastolic CHF (congestive heart failure) (HCC)   2. Coronary artery disease of native artery of native heart with stable angina pectoris (HCC)   3. Cardiac pacemaker in situ   4. Hypercholesterolemia   5. Blood loss anemia   6. Essential hypertension   7. Educated about COVID-19 virus infection    PLAN:    In order of problems listed above:  1. ***   Medication Adjustments/Labs and Tests Ordered: Current medicines are reviewed at length with the patient today.  Concerns regarding medicines are outlined above.  No orders of the defined types were placed in this encounter.  No orders of the defined types were placed in this encounter.   There are no Patient Instructions on file for this visit.   Signed, Sinclair Grooms, MD  09/19/2020 4:55 PM    [facility]

## 2020-09-22 ENCOUNTER — Inpatient Hospital Stay: Admission: RE | Admit: 2020-09-22 | Payer: Medicare Other | Source: Ambulatory Visit

## 2020-09-23 ENCOUNTER — Ambulatory Visit: Payer: Medicare Other | Admitting: Interventional Cardiology

## 2020-09-23 DIAGNOSIS — I25118 Atherosclerotic heart disease of native coronary artery with other forms of angina pectoris: Secondary | ICD-10-CM

## 2020-09-23 DIAGNOSIS — E7800 Pure hypercholesterolemia, unspecified: Secondary | ICD-10-CM

## 2020-09-23 DIAGNOSIS — Z950 Presence of cardiac pacemaker: Secondary | ICD-10-CM

## 2020-09-23 DIAGNOSIS — I10 Essential (primary) hypertension: Secondary | ICD-10-CM

## 2020-09-23 DIAGNOSIS — D500 Iron deficiency anemia secondary to blood loss (chronic): Secondary | ICD-10-CM

## 2020-09-23 DIAGNOSIS — Z7189 Other specified counseling: Secondary | ICD-10-CM

## 2020-09-23 DIAGNOSIS — I5043 Acute on chronic combined systolic (congestive) and diastolic (congestive) heart failure: Secondary | ICD-10-CM

## 2020-09-23 NOTE — Progress Notes (Signed)
Remote pacemaker transmission.   

## 2020-10-02 ENCOUNTER — Ambulatory Visit (INDEPENDENT_AMBULATORY_CARE_PROVIDER_SITE_OTHER): Payer: Medicare Other | Admitting: Primary Care

## 2020-10-02 ENCOUNTER — Encounter: Payer: Self-pay | Admitting: Primary Care

## 2020-10-02 ENCOUNTER — Ambulatory Visit: Payer: Medicare Other | Admitting: Pulmonary Disease

## 2020-10-02 ENCOUNTER — Other Ambulatory Visit: Payer: Self-pay

## 2020-10-02 VITALS — BP 106/60 | HR 65 | Temp 98.2°F | Ht 72.0 in | Wt 168.7 lb

## 2020-10-02 DIAGNOSIS — I5032 Chronic diastolic (congestive) heart failure: Secondary | ICD-10-CM

## 2020-10-02 DIAGNOSIS — I25118 Atherosclerotic heart disease of native coronary artery with other forms of angina pectoris: Secondary | ICD-10-CM | POA: Diagnosis not present

## 2020-10-02 DIAGNOSIS — M7989 Other specified soft tissue disorders: Secondary | ICD-10-CM | POA: Diagnosis not present

## 2020-10-02 DIAGNOSIS — J849 Interstitial pulmonary disease, unspecified: Secondary | ICD-10-CM | POA: Diagnosis not present

## 2020-10-02 DIAGNOSIS — K921 Melena: Secondary | ICD-10-CM | POA: Diagnosis not present

## 2020-10-02 DIAGNOSIS — K5900 Constipation, unspecified: Secondary | ICD-10-CM | POA: Insufficient documentation

## 2020-10-02 MED ORDER — POLYETHYLENE GLYCOL 3350 17 G PO PACK: 17.0000 g | PACK | Freq: Every day | ORAL | 0 refills | Status: AC | PRN

## 2020-10-02 MED ORDER — DOCUSATE SODIUM 100 MG PO CAPS: 100.0000 mg | ORAL_CAPSULE | Freq: Every day | ORAL | 0 refills | Status: AC

## 2020-10-02 NOTE — Assessment & Plan Note (Addendum)
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 UIP not felt to be currently flared. Patient has not tolerate steroids well in the past  - Unable to do PFTs today d/t mobility issues in geri-chair  - Advised strict aspiration precautions including chin-tuck method to reduce risk for aspiration and disease progression  - Encourage patient use Incentive spirometer and flutter valve regularly  - Continue 2L oxygen continuously and Albuterol hfa or neb q6 hours prn shortness of breath or wheezing - Could consider LAMA maintenance inhaler without PFT d/t emphysema seen on imaging

## 2020-10-02 NOTE — Patient Instructions (Addendum)
Orders: Right upper arm ultrasound if not already done - otherwise elevate and compress   Recommendations: - Use Incentive spirometer 5-10 breaths every hour while awake - Use flutter valve three-four times a day   - Continue to encourage strict aspiration precautions including chin-tuck method to reduce risk for aspiration and disease progression (he is not to eat lying down, must be upright in chair). Oral care BID, regular died with thin liquids, whole meds with puree, patient is able to self feed  - Continue diuresis per cardiology and continue daily weights  Meds: - Start Miralax daily as needed for constipation - Start stool softener daily  - Continue iron 325mg  daily  - Continue Protonix 40mg  daily  - Continue to HOLD Plavix - Continue lasix 20mg  daily and Spirolactone 25mg  daily  - Use Albuterol HFA or nebulizer every 6 hours as needed for shortness of breath/wheezing    Follow-up: - 4-6 weeks with Dr.Mannam only   - Needs follow-up with GI outpatient if he hasnt already   - Keep apt with cardiology       Aspiration Precautions, Adult Aspiration is the breathing in (inhalation) of a liquid or other material into the lungs. Adults with neurologicalimpairments, swallowing difficulties (dysphagia), decreased gag reflex, or impaired physical mobility are at risk for aspiration. Things that can be inhaled into the lungs include:  Food.  Any type of liquid, such as drinks or saliva.  Stomach contents, such as vomit or stomach acid. Aspiration can cause pneumonia. You can take steps to reduce the risk of aspiration. What are the signs of aspiration? Signs of aspiration include:  Coughing: ? Coughing after swallowing food or liquids. ? Coughing up phlegm (sputum) that:  Is yellow, tan, or green.  Has pieces of food in it.  Smells bad. ? Coughing when lying down or having to sit up quickly after lying down. ? Having a hoarse, barky cough.  Trouble breathing. This may  include: ? Breathing quickly. ? Breathing very slowly. ? Loud breathing. ? Rumbling sounds from the lungs while breathing.  Eating troubles, such as: ? Clearing the throat often while eating. ? Drooling while eating. ? A feeling of fullness in the throat or a feeling that something is stuck in the throat. ? Having a runny nose while eating. ? Watery eyes while eating. ? A pained look on the face while eating.  Speaking problems such as: ? Not being able to speak. ? A hoarse voice.  Choking often.  A change in skin color. The skin may look red or blue.  Fever.  Pain in the chest or back. What are the complications of aspiration? Complications of aspiration include:  Losing weight because the person is not absorbing needed nutrients.  Loss of enjoyment and the social benefits of eating.  Choking.  Lung irritation, if someone aspirates acidic food or drinks.  Lung infection (pneumonia).  Lung abscess, which is a collection of infected liquid (pus) in the lungs. In serious cases, death can occur. What can I do to prevent aspiration? Caring for someone who has a feeding tube If you are caring for someone who has a feeding tube and cannot eat or drink safely through his or her mouth:  Keep the person in an upright position as much as possible.  Do not lay the person flat if he or she is getting continuous feedings. If you need to lay the person flat for any reason, turn the feeding pump off.  Check feeding tube  residuals as told by the health care provider. Ask the health care provider what residual amount is too high. Caring for someone who can eat and drink safely by mouth If you are caring for someone who can eat and drink safely by mouth:  Have the person sit in an upright position when eating food or drinking fluids. This can be done in two ways: ? Have the person sit up in a chair. ? If sitting in a chair is not possible, position the person in bed so he or she  is upright.  Remind the person to eat slowly and chew well. Make sure the person is awake and alert while eating. Never put food or liquids in the mouth of a person who is not fully alert.  Do not distract the person. This is especially important for people with thinking or memory (cognitive) problems.  Allow foods to cool. Hot foods may be more difficult to swallow.  Provide small meals more frequently instead of three large meals. This may reduce fatigue during eating.  After the person has finishing eating: ? Check the person's mouth thoroughly for leftover food. ? Keep the person sitting upright for 30-45 minutes.  Do not serve food or drink within 2 hours of bedtime. General instructions Follow these general guidelines to prevent aspiration in someone who can eat and drink safely by mouth:  Feed small amounts of food. Do not force-feed.  For a person who is on a diet for swallowing difficulty (dysphagia diet), follow the recommended food and drink consistency. For example, you may be told to thicken a liquid using a commercial food thickening product.  Use as little water as possible when brushing the person's teeth or cleaning his or her mouth.  Provide oral care before and after meals.  Use adaptive devices, such as cut-out cups or other utensils, as told by the health care provider.  Crush pills and put them in soft food such as pudding or ice cream. Some pills should not be crushed. Check with the health care provider before crushing any medicine.  If someone is choking on food or an object, perform the Heimlich maneuver (abdominal thrusts).   Contact a health care provider if the person:  Has a feeding tube, and the feeding tube residual amount is too high.  Has a fever.  Tries to avoid food or water, such as refusing to eat, drink, or be fed, or is eating less than normal.  May have aspirated food or liquid.  Is showing warning signs, such as choking or coughing,  when he or she eats or drinks. Get help right away if the person:  Has trouble breathing or starts to breathe quickly.  Is breathing very slowly or stops breathing.  Coughs a lot after eating or drinking.  Has a long-lasting (chronic) cough.  Coughs up thick, yellow, or tan phlegm.  Has symptoms of pneumonia, such as: ? Coughing a lot. ? Coughing up mucus with a bad smell or blood in it. ? Feeling short of breath. ? Complaining of chest pain. ? Sweating, fever, and chills. ? Feeling tired. ? Complaining of trouble breathing. ? Wheezing.  Cannot stop choking.  Is unable to breathe, turns blue, faints, or seems confused. These symptoms may represent a serious problem that is an emergency. Do not wait to see if the symptoms will go away. Get medical help right away. Call your local emergency services (911 in the [place]  Summary  Aspiration is the breathing  in (inhalation) of a liquid or material into the lungs. Things that can be inhaled into the lungs include food, liquids, saliva, or stomach contents.  Aspiration can cause pneumonia or choking.  One sign of aspiration is coughing after swallowing food or liquids.  Contact your health care provider if you notice signs of aspiration. This information is not intended to replace advice given to you by your health care provider. Make sure you discuss any questions you have with your health care provider. Document Revised: 05/12/2020 Document Reviewed: 09/09/2019 Elsevier Patient Education  2021 [employer].

## 2020-10-02 NOTE — Assessment & Plan Note (Addendum)
-   Continue lasix 20mg  daily and Spironolactone 25mg  daily  - Follows with cardiology

## 2020-10-02 NOTE — Assessment & Plan Note (Addendum)
-   Hospitalized in December 2021. Heme positive, Hgb 7.1 transfused 1 unit PRBC.  - EGD 09/14/20 showed gastritis and angiodysplastic lesion  - Plavix on HOLD, continue protonix 40mg  daily  - Refer to GI for outpatient follow-up

## 2020-10-02 NOTE — Progress Notes (Signed)
@Patient  ID: Joshua [name], male    DOB: 06-May-1932, 87 y.o.   MRN: [id number]  Chief Complaint  Patient presents with  . Follow-up    SOB with activity, 2L O2      Referring provider: Hassell Done Mary-Margaret, *  HPI: 87 year old, former smoker quit in 1974 (30 pack year hx). PMH CAD status post PCI, chronic diastolic CHF, AV block requiring PPM, COPD, GERD, history of prostate treated with seed implant, history of bladder cancer, hypertension, hyperlipidemia, seizures (caused by intracranial bleed from AV malformation status post craniotomy in 1980). Patient of Dr. Vaughan Browner.   Previous [facility] pulmoanry encounter: 07/21/20- Dr. Vaughan Browner, Consult Admitted with hypoxia on 06/25/2020 thought to be secondary to community-acquired pneumonia, acute on chronic diastolic heart failure with concern for underlying ILD.  He was diuresed, given antibiotics.  CT scan showed some bronchiectasis with reticulation, fluid in the fissure.  Antibiotics were discontinued as it was felt that possibility of infection was not high and he was discharged on prednisone, supplemental oxygen.  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  No mold, hot tub, Jacuzzi.  No feather pillows or comforters Smoking history: 30-pack-year smoker.  Quit in 1970 Travel history: No significant travel history Relevant family history: No significant family history of lung disease  12/2/2021Volanda Napoleon, NP Patient presents today for increased sob and fatigue x 1 day. Patient of Dr. Matilde Bash. CT scan showed mild reticulation which is nonspecific in no particular pattern. Ordered for additional labs including ANA, CCP, myositis panel and hypersensitivity panel. Needs repeat CT imaging and PFTs in 1-2 months.   He was brought to his appointment by EMS, accompanied by son. Family called them this morning d/t weakness/fatigue. Symptoms came on suddenly. Exam and vital signs appear stable. He wears 3L oxygen  continuously. They have noticed O2 in 80s with exertion. He has long oxygen tubing at his house. He does not have a cough. Appetite has been ok. Denies f/c/s, confusion, purulent cough, chest tightness, wheezing, N/V/D.   08/28/2020 Volanda Napoleon, NP Patient contacted today for 1 week follow-up. He was treated for suspected pneumonia with Levaquin. Spoke with patient and his care taker [name]. He is feeling "fine". He has no residual sob or fatigue. Care taker reports that he has been doing a lot better the last three days. He has a new cough with some yellow mucus. He has been using 4L oxygen, O2 97% but drops into high 80s with ambulation. He has a lot of extra O2 tubing. He has not needed to use his rescue inhaler the last several days. He has a follow-up CT in January and OV with Dr. Vaughan Browner following this.   10/02/2020 - Interim hx  Patient presents today for 1 month fu with PFTs. Since last visit he was admitted to [facility] from 12/25-12/31 for acute on chronic diastolic heart failure, chronic respiratory failure secondary to ILD and GIB. He presented to ED with worsening dyspnea x 2 days and black tarry stools. In October 2021 his EF 50%, grade 1 DD. He was started on lasix, Lisinopril and aldactone. Discharged home on lasix. HRCT showed ILD evaluated by Dr. Tamala Julian who recommended follow-up outpatient. Heme positive, Hgb dropped to 7.1 and was transfused 1 unit PRBCs. GI consulted, EGD performed on 09/14/20 that showed gastritis in the duodenum and a angiodysplastic lesion. Plavix stopped. Follow up with GI outpatient.   He is accompanied by aid today. Currently at SNF. He was unable  to do PFTs today d/t mobility. He is in a geri-chair for appointent. He experiences moderate shortness of breath with exertion. He is wearing 2L oxygen continuously. He is on 325mg  Iron daily. His last BM was 3-4 days ago. He reports that he has been eating three meals a day.  He is down 5 lbs since discharge. Current  weight 168lbs. Patient reports right upper extremity swelling, he tells me that he had an ultrasound at SNF that did not show blood clot. No records in chart. He gets physical therapy once a day. No significant cough, chest pain, chest tightness, wheezing.    TESTING:  ECHO 10/5 >> LVEF 50-55%, no RWMA, grade I diastolic dysfunction, moderately elevated pulmonary pressure  CT Chest w/o 10/7 >> emphysema with diffuse mild cylindrical bronchiectasis and subpleural reticulation suggesting underlying fibrotic lung disease, apparent loculated fluid posterior major fissure on the right with subpleural rind of consolidative opacity in the deep posterior right costophrenic sulcus  SLP MBSS 10/8 >> silent aspiration of thin liquids, rec's for chin tuck reg diet / thin liquids   HRCT 12/27 >> appearance of lungs compatible with interstitial lung disease, with a spectrum of findings considered probable usual interstitial pneumonia, diffuse bronchial wall thickening with mild centrilobular and paraseptal emphysema, 3 vessel CAD, calcifications of the aortic valve and mitral annulus, mild cardiomegaly    Allergies  Allergen Reactions  . Penicillins Rash    Has patient had a PCN reaction causing immediate rash, facial/tongue/throat swelling, SOB or lightheadedness with hypotension: ##Yes## Has patient had a PCN reaction causing severe rash involving mucus membranes or skin necrosis: No Has patient had a PCN reaction that required hospitalization No Has patient had a PCN reaction occurring within the last 10 years: No If all of the above answers are "NO", then may proceed with Cephalosporin use.   . Prednisone     "shaking and hands twitching"     Immunization History  Administered Date(s) Administered  . Fluad Quad(high Dose 65+) 07/01/2019, 07/28/2020  . Influenza, High Dose Seasonal PF 07/08/2016, 08/18/2017, 08/29/2018  . Influenza,inj,Quad PF,6+ Mos 07/08/2013, 09/10/2014, 08/26/2015  .  Moderna Sars-Covid-2 Vaccination 10/12/2019, 11/09/2019, 07/28/2020  . Pneumococcal Conjugate-13 07/08/2013  . Pneumococcal Polysaccharide-23 08/18/2017  . Td 12/11/2000, 08/31/2011  . Tdap 08/31/2011    Past Medical History:  Diagnosis Date  . 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 Arthritis   . Bilateral lower extremity edema   . CAD (coronary artery disease) cardiologist--  dr Daneen Schick   a. 04/27/2016 PCI with DES to RCA with 50% ostial to 60% segmental mid to distal left main, and 90% thrombus filled ostial to proximal circumflex, with distal right coronary filled by collaterals from left-to-right  . COPD with emphysema ([place])   . Dyspnea on exertion   . ED (erectile dysfunction)   . First degree AV block   . GERD (gastroesophageal reflux disease)   . History of adenomatous polyp of colon    09-22-2008  . History of bladder cancer    s/p  resection bladder tumor 04-19-2016  non-invasive low-grade urothelial carcinoma  . History of prostate cancer urologist-  dr Jeffie Pollock-- last PSA 0.02 (summer 2017)   dx 02/ 2013-- Stage T2a, Gleason 7, PSA 1.58--  s/p  radioactive prostate seed implants 01-19-2012  . History of ST elevation myocardial infarction (STEMI)    04-27-2016  . Hyperlipidemia   . Hypertension   . Macular degeneration   .  Presence of permanent cardiac pacemaker   . S/P drug eluting coronary stent placement    04-27-2016   . Second degree Mobitz I AV block    occasional w/ first degree heart block  per cardiologist note by dr Daneen Schick  . Seizures ([place]) per pt son -- no seizure's since 1980   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  arteriorvenous cerebral malformation s/p  craniotomy 1980  . Vitamin D deficiency     Tobacco History: Social History   Tobacco Use  Smoking Status Former Smoker  . Packs/day: 2.00  . Years: 15.00  . Pack years: 30.00  . Quit date: 01/15/1973  . Years since  quitting: 47.7  Smokeless Tobacco Never Used   Counseling given: Not Answered   Outpatient Medications Prior to Visit  Medication Sig Dispense Refill  . aspirin EC 81 MG tablet Take 81 mg by mouth daily. Swallow whole.    Marland Kitchen atorvastatin (LIPITOR) 20 MG tablet TAKE 1 TABLET DAILY 90 tablet 0  . calcium carbonate (OSCAL) 1500 (600 Ca) MG TABS tablet Take 600 mg of elemental calcium by mouth daily with breakfast.    . Cholecalciferol (VITAMIN D3) 50 MCG (2000 UT) capsule Take 2,000 Units by mouth daily.     . citalopram (CELEXA) 20 MG tablet TAKE 1 TABLET DAILY 90 tablet 0  . ferrous sulfate 325 (65 FE) MG tablet Take 1 tablet (325 mg total) by mouth daily with breakfast.  3  . furosemide (LASIX) 40 MG tablet Take 1 tablet (40 mg total) by mouth daily. (Patient taking differently: Take 20 mg by mouth daily.) 15 tablet 0  . MAGNESIUM PO Take 1 tablet by mouth at bedtime.    . Multiple Vitamins-Minerals (PRESERVISION AREDS 2) CAPS Take 1 capsule by mouth 2 (two) times daily.     . pantoprazole (PROTONIX) 40 MG tablet Take 1 tablet (40 mg total) by mouth daily. 30 tablet 0  . PHENobarbital (LUMINAL) 64.8 MG tablet Take 1 tablet (64.8 mg total) by mouth 2 (two) times daily. 180 tablet 0  . phenytoin (DILANTIN) 100 MG ER capsule Take 1 capsule (100 mg total) by mouth in the morning, at noon, in the evening, and at bedtime. 120 capsule 1  . polyvinyl alcohol (LIQUIFILM TEARS) 1.4 % ophthalmic solution Place 2 drops into both eyes daily as needed for dry eyes.     Marland Kitchen spironolactone (ALDACTONE) 25 MG tablet Take 1 tablet (25 mg total) by mouth daily. 30 tablet 0  . albuterol (VENTOLIN HFA) 108 (90 Base) MCG/ACT inhaler Inhale 1-2 puffs into the lungs every 6 (six) hours as needed for wheezing or shortness of breath. (Patient not taking: Reported on 10/02/2020) 18 g 0  . furosemide (LASIX) 40 MG tablet Take 0.5 tablets (20 mg total) by mouth daily. 15 tablet 0  . predniSONE (DELTASONE) 20 MG tablet Take 1  tablet (20 mg total) by mouth daily with breakfast. (Patient not taking: Reported on 10/02/2020) 10 tablet 0   No facility-administered medications prior to visit.   Review of Systems  Review of Systems  Constitutional: Negative for chills and fever.  Respiratory: Positive for shortness of breath. Negative for cough, chest tightness and wheezing.   Cardiovascular: Negative for leg swelling.  Musculoskeletal:       Right elbow swelling   Physical Exam  BP 106/60 (BP Location: Left Arm, Cuff Size: Normal)   Pulse 65   Temp 98.2 F (36.8 C)   Ht 6' (1.829  m)   Wt 168 lb 11.2 oz (76.5 kg)   SpO2 99%   BMI 22.88 kg/m  Physical Exam Constitutional:      Appearance: Normal appearance. He is ill-appearing.     Comments: Elderly male, chronically ill appearing  Cardiovascular:     Rate and Rhythm: Normal rate and regular rhythm.     Comments: No LE edema Pulmonary:     Breath sounds: Rales present.  Musculoskeletal:     Comments: In Geri-chair; RUE edema +2-3  Neurological:     General: No focal deficit present.     Mental Status: He is alert and oriented to person, place, and time. Mental status is at baseline.  Psychiatric:        Mood and Affect: Mood normal.        Behavior: Behavior normal.        Thought Content: Thought content normal.        Judgment: Judgment normal.      Lab Results:  CBC    Component Value Date/Time   WBC 10.4 09/17/2020 0456   RBC 2.65 (L) 09/17/2020 0456   HGB 8.5 (L) 09/17/2020 0456   HGB 10.2 (L) 07/17/2020 1427   HCT 26.6 (L) 09/17/2020 0456   HCT 30.7 (L) 07/17/2020 1427   PLT 333 09/17/2020 0456   PLT 339 07/17/2020 1427   MCV 100.4 (H) 09/17/2020 0456   MCV 90 07/17/2020 1427   MCH 32.1 09/17/2020 0456   MCHC 32.0 09/17/2020 0456   RDW 20.5 (H) 09/17/2020 0456   RDW 15.6 (H) 07/17/2020 1427   LYMPHSABS 1.1 09/12/2020 0015   LYMPHSABS 2.0 07/17/2020 1427   MONOABS 2.0 (H) 09/12/2020 0015   EOSABS 0.1 09/12/2020 0015    EOSABS 0.7 (H) 07/17/2020 1427   BASOSABS 0.1 09/12/2020 0015   BASOSABS 0.1 07/17/2020 1427    BMET    Component Value Date/Time   NA 139 09/18/2020 0323   NA 136 07/17/2020 1427   K 3.6 09/18/2020 0323   CL 103 09/18/2020 0323   CO2 27 09/18/2020 0323   GLUCOSE 99 09/18/2020 0323   BUN 22 09/18/2020 0323   BUN 14 07/17/2020 1427   CREATININE 0.89 09/18/2020 0323   CREATININE 0.94 05/16/2016 1602   CALCIUM 8.5 (L) 09/18/2020 0323   GFRNONAA >60 09/18/2020 0323   GFRAA 90 07/17/2020 1427    BNP    Component Value Date/Time   BNP 976.5 (H) 09/15/2020 0407    ProBNP    Component Value Date/Time   PROBNP 424 08/21/2019 1638    Imaging: CT ABDOMEN PELVIS W CONTRAST  Result Date: 09/14/2020 CLINICAL DATA:  Anemia prostate cancer EXAM: CT ABDOMEN AND PELVIS WITH CONTRAST TECHNIQUE: Multidetector CT imaging of the abdomen and pelvis was performed using the standard protocol following bolus administration of intravenous contrast. CONTRAST:  1106mL OMNIPAQUE IOHEXOL 300 MG/ML  SOLN COMPARISON:  CT 09/24/2009, MRI 04/24/2012 FINDINGS: Lower chest: Moderate right and small left pleural effusion. Cardiomegaly with partially visualized intracardiac pacing leads. Mild subpleural fibrosis. No acute consolidation. Dependent atelectasis in the lower lobes. Hepatobiliary: Small gallstones. No focal hepatic abnormality. No biliary dilatation. Pancreas: Unremarkable. No pancreatic ductal dilatation or surrounding inflammatory changes. Spleen: Normal in size without focal abnormality. Adrenals/Urinary Tract: Adrenal glands are within normal limits. Kidneys show no hydronephrosis. Cysts within the left kidney. Delayed nephrogram with poor excretion of contrast on delayed views. The urinary bladder is unremarkable Stomach/Bowel: The stomach is nonenlarged. No dilated small bowel. No acute  bowel wall thickening. Negative appendix. Vascular/Lymphatic: Moderate aortic atherosclerosis. No aneurysm. No  suspicious nodes. Reproductive: Post treatment changes of the prostate. Other: Mild presacral soft tissue stranding presumably due to post treatment change. Edema within the subcutaneous soft tissues consistent with anasarca. No free air or significant free fluid. Musculoskeletal: Degenerative changes of the spine. No acute or suspicious osseous lesions are visualized. Chronic appearing deformity of the right inferior pubic ramus. IMPRESSION: 1. Post treatment changes of the prostate gland. No CT evidence for metastatic disease within the abdomen or pelvis. 2. Moderate right and small left pleural effusions. Cardiomegaly. Generalized subcutaneous edema consistent with anasarca. 3. Cholelithiasis. 4. Delayed nephrogram, consistent with decreased renal function, recommend correlation with appropriate laboratory studies. Aortic Atherosclerosis (ICD10-I70.0). Electronically Signed   By: Donavan Foil M.D.   On: 09/14/2020 22:56   CT Chest High Resolution  Result Date: 09/14/2020 CLINICAL DATA:  87 year old male with history of respiratory failure. Worsening shortness of breath. Evaluate for interstitial lung disease. EXAM: CT CHEST WITHOUT CONTRAST TECHNIQUE: Multidetector CT imaging of the chest was performed following the standard protocol without intravenous contrast. High resolution imaging of the lungs, as well as inspiratory and expiratory imaging, was performed. COMPARISON:  Chest CT 06/25/2020. FINDINGS: Cardiovascular: Heart size is mildly enlarged. There is no significant pericardial fluid, thickening or pericardial calcification. There is aortic atherosclerosis, as well as atherosclerosis of the great vessels of the mediastinum and the coronary arteries, including calcified atherosclerotic plaque in the left main, left anterior descending, left circumflex and right coronary arteries. Calcifications of the aortic valve and mitral annulus. Right-sided pacemaker/AICD with lead tips terminating in the right  atrium and right ventricle. Mediastinum/Nodes: No pathologically enlarged mediastinal or hilar lymph nodes. Please note that accurate exclusion of hilar adenopathy is limited on noncontrast CT scans. Esophagus is unremarkable in appearance. No axillary lymphadenopathy. Lungs/Pleura: Moderate right and small left pleural effusions lying dependently with some associated passive subsegmental atelectasis in the lower lobes of the lungs bilaterally. High-resolution imaging is limited by considerable patient respiratory motion. With these limitations in mind, there are some patchy peripheral predominant areas of ground-glass attenuation, septal thickening, cylindrical bronchiectasis and peripheral bronchiolectasis in the lungs bilaterally, most evident throughout the mid to lower lungs. No frank honeycombing. Diffuse bronchial wall thickening with mild centrilobular and paraseptal emphysema. Inspiratory and expiratory imaging is unremarkable. No acute consolidative airspace disease. No definite suspicious appearing pulmonary nodules or masses are noted. Upper Abdomen: Aortic atherosclerosis. Musculoskeletal: There are no aggressive appearing lytic or blastic lesions noted in the visualized portions of the skeleton. IMPRESSION: 1. The appearance of the lungs is compatible with interstitial lung disease, with a spectrum of findings considered probable usual interstitial pneumonia (UIP) per current ATS guidelines. Repeat high-resolution chest CT is recommended in 12 months to assess for temporal changes in the appearance of the lung parenchyma. 2. Diffuse bronchial wall thickening with mild centrilobular and paraseptal emphysema; imaging findings suggestive of underlying COPD. 3. Aortic atherosclerosis, in addition to left main and 3 vessel coronary artery disease. 4. There are calcifications of the aortic valve and mitral annulus. Echocardiographic correlation for evaluation of potential valvular dysfunction may be  warranted if clinically indicated. 5. Mild cardiomegaly. Aortic Atherosclerosis (ICD10-I70.0) and Emphysema (ICD10-J43.9). Electronically Signed   By: Vinnie Langton M.D.   On: 09/14/2020 13:13   DG CHEST PORT 1 VIEW  Result Date: 09/13/2020 CLINICAL DATA:  Pulmonary edema EXAM: PORTABLE CHEST - 1 VIEW COMPARISON:  the previous day's study FINDINGS: Mild interstitial  edema or infiltrates, improved since previous. Improved left retrocardiac aeration. Stable cardiomegaly. Aortic Atherosclerosis (ICD10-170.0). Stable left subclavian dual lead transvenous pacemaker. Blunting of lateral costophrenic angles as before.  No pneumothorax. Mild diffuse osteopenia. IMPRESSION: Interval improvement in bilateral edema/infiltrates. Electronically Signed   By: Lucrezia Europe M.D.   On: 09/13/2020 08:16   DG Chest Portable 1 View  Result Date: 09/12/2020 CLINICAL DATA:  Initial evaluation for acute shortness of breath. EXAM: PORTABLE CHEST 1 VIEW COMPARISON:  Prior radiograph from 08/20/2020. FINDINGS: Right-sided pacemaker/AICD in place. Cardiomegaly, stable. Mediastinal silhouette normal. Aortic atherosclerosis. Lungs hypoinflated. Small layering bilateral pleural effusions with associated bibasilar atelectasis. Diffuse prominence of the pulmonary vasculature and interstitial markings, favored to reflect mild pulmonary interstitial congestion/edema. Atypical pneumonitis could also have this appearance. No pneumothorax. No acute osseous finding. IMPRESSION: 1. Cardiomegaly with diffuse prominence of the pulmonary vasculature and interstitial markings, favored to reflect mild pulmonary interstitial congestion/edema. Possible atypical pneumonitis could be considered in the correct clinical setting. 2. Superimposed small layering bilateral pleural effusions with associated bibasilar atelectasis. 3.  Aortic Atherosclerosis (ICD10-I70.0). Electronically Signed   By: Jeannine Boga M.D.   On: 09/12/2020 00:32   CUP  PACEART REMOTE DEVICE CHECK  Result Date: 09/09/2020 Scheduled remote reviewed. Normal device function.  26 AT/AF events; longest logged 16 minutes 29 seconds; EGMs suggest A. Flutter vs AT - routing to triage for further evaluation. No history noted in EPIC. Next remote 91 days. HB    Assessment & Plan:   ILD (interstitial lung disease) ([place]) - Dyspnea felt to be mostly d/t anemia as well as mild pulmonary edema, deconditioning and likely ongoing aspiration - HRCT favors UIP with emphysema overlap and component of chronic aspiration. UIP not felt to be currently flared. Patient has not tolerate steroids well in the past  - Unable to do PFTs today d/t mobility issues in geri-chair  - Advised strict aspiration precautions including chin-tuck method to reduce risk for aspiration and disease progression  - Encourage patient use Incentive spirometer and flutter valve regularly  - Continue 2L oxygen continuously and Albuterol hfa or neb q6 hours prn shortness of breath or wheezing - Could consider LAMA maintenance inhaler without PFT d/t emphysema seen on imaging   Chronic diastolic heart failure ([facility]) - Continue lasix 20mg  daily and Spironolactone 25mg  daily  - Follows with cardiology   Swelling of right upper extremity - Patient has new right upper arm swelling, likely from a phlebitis if U/S at facility was negative - Advised SNF order ultrasound if not already done  - Recommend elevation and compression  Constipation - Patient reports no BM in 3-4 days - Recommend daily Colace 100mg  and prn miralax 17gm   Melena - Hospitalized in December 2021. Heme positive, Hgb 7.1 transfused 1 unit PRBC.  - EGD 09/14/20 showed gastritis and angiodysplastic lesion  - Plavix on HOLD, continue protonix 40mg  daily  - Refer to GI for outpatient follow-up     Martyn Ehrich, NP 10/02/2020

## 2020-10-02 NOTE — Assessment & Plan Note (Signed)
-   Patient reports no BM in 3-4 days - Recommend daily Colace 100mg  and prn miralax 17gm

## 2020-10-02 NOTE — Assessment & Plan Note (Signed)
-   Patient has new right upper arm swelling, likely from a phlebitis if U/S at facility was negative - Advised SNF order ultrasound if not already done  - Recommend elevation and compression

## 2020-10-13 NOTE — Progress Notes (Signed)
Cardiology Office Note:    Date:  10/16/2020   ID:  [name], DOB 1932/03/16, MRN [id number]  PCP:  Chevis Pretty, FNP  Cardiologist:  Sinclair Grooms, MD   Referring MD: Chevis Pretty, *   Chief Complaint  Patient presents with  . Coronary Artery Disease  . Congestive Heart Failure  . Pacemaker Problem    History of Present Illness:    Joshua [name] is a 87 y.o. 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 Prior acute diastolic heart failure resolved with stent.  He is accompanied by his daughter.  Breathing is only concerned.  He is on chronic oxygen therapy.  He is wearing a mask.  Shortness of breath is on exertion.  He does not have any orthopnea.  There is no peripheral edema.  He denies episodes of chest pain and has not needed to use sublingual nitroglycerin.  His daughter confirms that his exertional tolerance is decreasing.  He is using a walker for support.  Past Medical History:  Diagnosis Date  . 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 Arthritis   . Bilateral lower extremity edema   . CAD (coronary artery disease) cardiologist--  dr Daneen Schick   a. 04/27/2016 PCI with DES to RCA with 50% ostial to 60% segmental mid to distal left main, and 90% thrombus filled ostial to proximal circumflex, with distal right coronary filled by collaterals from left-to-right  . COPD with emphysema ([place])   . Dyspnea on exertion   . ED (erectile dysfunction)   . First degree AV block   . GERD (gastroesophageal reflux disease)   . History of adenomatous polyp of colon    09-22-2008  . History of bladder cancer    s/p  resection bladder tumor 04-19-2016  non-invasive low-grade urothelial carcinoma  . History of  prostate cancer urologist-  dr Jeffie Pollock-- last PSA 0.02 (summer 2017)   dx 02/ 2013-- Stage T2a, Gleason 7, PSA 1.58--  s/p  radioactive prostate seed implants 01-19-2012  . History of ST elevation myocardial infarction (STEMI)    04-27-2016  . Hyperlipidemia   . Hypertension   . Macular degeneration   . Presence of permanent cardiac pacemaker   . S/P drug eluting coronary stent placement    04-27-2016   . Second degree Mobitz I AV block    occasional w/ first degree heart block  per cardiologist note by dr Daneen Schick  . Seizures ([place]) per pt son -- no seizure's since 1980   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  arteriorvenous cerebral malformation s/p  craniotomy 1980  . Vitamin D deficiency     Past Surgical History:  Procedure Laterality Date  . APPENDECTOMY  2011  . BLEPHAROPLASTY Bilateral revision 02-17-2016  . CARDIAC CATHETERIZATION N/A 04/27/2016   Procedure: Left Heart Cath and Coronary Angiography;  Surgeon: Belva Crome, MD;  Location: [place] [facility];  Service: Cardiovascular;  Laterality: N/A;  acute inferior wall STEMI ;  ostCx to midCx 90%,  ost1st Mrg to 1st Mrg 50%,  mid to distal RCA 100%,  pRCA 80%,  LVEF 45-50% w/ inferobasal akinesis  . CARDIAC CATHETERIZATION N/A 04/27/2016   Procedure: Temporary Pacemaker;  Surgeon: Belva Crome, MD;  [facility]  INVASIVE CV LAB;  Service: Cardiovascular;  Laterality: N/A;  mobitz 2  second degree HB w/ heart rate 29bpm  . CARDIAC CATHETERIZATION N/A 04/27/2016   Procedure: Coronary Stent Intervention;  Surgeon: Belva Crome, MD;  Location: [place] [facility];  Service: Cardiovascular;  Laterality: N/A; DES x1 to  Proximal RCA;  DES x1 to Mid RCA  . CATARACT EXTRACTION W/ INTRAOCULAR LENS  IMPLANT, BILATERAL  2016  approx.  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 CYSTOSCOPY  01/19/2012   Procedure: CYSTOSCOPY;  Surgeon: Malka So, MD;  Location: [facility];  Service: Urology;;  no seeds found in bladder  . CYSTOSCOPY W/ RETROGRADES Bilateral 04/19/2016   Procedure: CYSTOSCOPY WITH BILATERAL RETROGRADE PYELOGRAM TRANSURETHRAL RESECTION OF BLADDER TUMOR ;  Surgeon: [name], MD;  Location: [facility];  Service: Urology;  Laterality: Bilateral;  . CYSTOSCOPY WITH BIOPSY N/A 11/10/2016   Procedure: CYSTOSCOPY WITH BIOPSY AND FULGURATION;  Surgeon: [name], MD;  Location: [facility];  Service: Urology;  Laterality: N/A;  . ENTEROSCOPY N/A 09/16/2020   Procedure: ENTEROSCOPY;  Surgeon: Jackquline Denmark, MD;  Location: [facility] ENDOSCOPY;  Service: Endoscopy;  Laterality: N/A;  . GIVENS CAPSULE STUDY N/A 09/16/2020   Procedure: GIVENS CAPSULE STUDY;  Surgeon: Jackquline Denmark, MD;  Location: [facility] ENDOSCOPY;  Service: Endoscopy;  Laterality: N/A;  . HOT HEMOSTASIS N/A 09/16/2020   Procedure: HOT HEMOSTASIS (ARGON PLASMA COAGULATION/BICAP);  Surgeon: Jackquline Denmark, MD;  Location: [facility] ENDOSCOPY;  Service: Endoscopy;  Laterality: N/A;  . INSERT / REPLACE / REMOVE PACEMAKER  06/15/2017  . LEAD EXTRACTION N/A 01/22/2018   Procedure: LEAD EXTRACTION;  Surgeon: Evans Lance, MD;  Location: [place] [facility];  Service: Cardiovascular;  Laterality: N/A;  . PACEMAKER IMPLANT N/A 06/15/2017   Procedure: Pacemaker Implant;  Surgeon: [name], MD;  Location: [place] [facility];  Service: Cardiovascular;  Laterality: N/A;  . PACEMAKER IMPLANT N/A 01/25/2018   Procedure: PACEMAKER IMPLANT;  Surgeon: Evans Lance, MD;  Location: [place] [facility];  Service: Cardiovascular;  Laterality: N/A;  . PPM GENERATOR REMOVAL N/A 01/22/2018   Procedure: PPM GENERATOR REMOVAL;  Surgeon: Evans Lance, MD;  Location: [place] [facility];  Service: Cardiovascular;  Laterality: N/A;  . RADIOACTIVE SEED IMPLANT  01/19/2012   Procedure: RADIOACTIVE SEED IMPLANT;  Surgeon: Malka So, MD;  Location: [facility];  Service: Urology;  Laterality: N/A;  68 seeds  implanted  . SUBMUCOSAL TATTOO INJECTION  09/16/2020   Procedure: SUBMUCOSAL TATTOO INJECTION;  Surgeon: Jackquline Denmark, MD;  Location: [facility] ENDOSCOPY;  Service: Endoscopy;;  . TRANSTHORACIC ECHOCARDIOGRAM  06/02/2016   ef 60-65%/  trivial MR/  mild TR    Current Medications: Current Meds  Medication Sig  . albuterol (VENTOLIN HFA) 108 (90 Base) MCG/ACT inhaler Inhale 1-2 puffs into the lungs every 6 (six) hours as needed for wheezing or shortness of breath.  Marland Kitchen aspirin EC 81 MG tablet Take 81 mg by mouth daily. Swallow whole.  Marland Kitchen atorvastatin (LIPITOR) 20 MG tablet TAKE 1 TABLET DAILY  . baclofen (LIORESAL) 10 MG tablet Take 5 mg by mouth 2 (two) times daily.  . calcium carbonate (OSCAL) 1500 (600 Ca) MG TABS tablet Take 600 mg of elemental calcium by mouth daily with breakfast.  . Cholecalciferol (VITAMIN D3) 50 MCG (2000 UT) capsule Take 2,000 Units by mouth daily.   . citalopram (CELEXA) 20 MG tablet  TAKE 1 TABLET DAILY  . docusate sodium (COLACE) 100 MG capsule Take 1 capsule (100 mg total) by mouth daily.  . ferrous sulfate 325 (65 FE) MG tablet Take 1 tablet (325 mg total) by mouth daily with breakfast.  . furosemide (LASIX) 20 MG tablet Take 20 mg by mouth daily.  Marland Kitchen MAGNESIUM PO Take 1 tablet by mouth at bedtime.  . Multiple Vitamins-Minerals (PRESERVISION AREDS 2) CAPS Take 1 capsule by mouth 2 (two) times daily.   . pantoprazole (PROTONIX) 40 MG tablet Take 1 tablet (40 mg total) by mouth daily.  Marland Kitchen PHENobarbital (LUMINAL) 64.8 MG tablet Take 1 tablet (64.8 mg total) by mouth 2 (two) times daily.  . phenytoin (DILANTIN) 100 MG ER capsule Take 1 capsule (100 mg total) by mouth in the morning, at noon, in the evening, and at bedtime.  . polyethylene glycol (MIRALAX / GLYCOLAX) 17 g packet Take 17 g by mouth daily as needed for mild constipation or moderate constipation.  . polyvinyl alcohol (LIQUIFILM TEARS) 1.4 % ophthalmic solution Place 2 drops into both eyes daily as needed for dry  eyes.   Marland Kitchen spironolactone (ALDACTONE) 25 MG tablet Take 1 tablet (25 mg total) by mouth daily.     Allergies:   Penicillins and Prednisone   Social History   Socioeconomic History  . Marital status: Widowed    Spouse name: Not on file  . Number of children: 2  . Years of education: 1 year of college  . Highest education level: Not on file  Occupational History  . Occupation: Retired  Tobacco Use  . Smoking status: Former Smoker    Packs/day: 2.00    Years: 15.00    Pack years: 30.00    Quit date: 01/15/1973    Years since quitting: 47.7  . Smokeless tobacco: Never Used  Vaping Use  . Vaping Use: Never used  Substance and Sexual Activity  . Alcohol use: Yes    Comment: occasionally  . Drug use: No  . Sexual activity: Not Currently  Other Topics Concern  . Not on file  Social History Narrative   Lives at home alone. Has contact with son daily and has dinner with him weekly. [place]    Social Determinants of Health   Financial Resource Strain: Not on file  Food Insecurity: Not on file  Transportation Needs: Not on file  Physical Activity: Not on file  Stress: Not on file  Social Connections: Not on file     Family History: The patient's family history includes Emphysema in his father and mother; Stroke in his mother.  ROS:   Please see the history of present illness.    Chronic O2 therapy.  Feels his lung problem is getting worse.  Appetite has been stable.  Decreased energy.  Lives in a skilled care facility.  [employer].  All other systems reviewed and are negative.  EKGs/Labs/Other Studies Reviewed:    The following studies were reviewed today: No new imaging or cardiac data.  Echocardiogram October 2021: IMPRESSIONS    1. Left ventricular ejection fraction, by estimation, is 50 to 55%. The  left ventricle has low normal function. The left ventricle has no regional  wall motion abnormalities. There is mild concentric left  ventricular  hypertrophy. Left ventricular  diastolic parameters are consistent with Grade I diastolic dysfunction  (impaired relaxation).  2. Right ventricular systolic function is mildly reduced. The right  ventricular size is normal. There is moderately elevated  pulmonary artery  systolic pressure.  3. Right atrial size was mildly dilated.  4. The mitral valve is normal in structure. Mild mitral valve  regurgitation.  5. The aortic valve is tricuspid. There is mild calcification of the  aortic valve. There is mild thickening of the aortic valve. Aortic valve  regurgitation is not visualized.  6. Aortic dilatation noted. There is mild dilatation of the aortic root,  measuring 37 mm.  7. The inferior vena cava is dilated in size with >50% respiratory  variability, suggesting right atrial pressure of 8 mmHg.   Comparison(s): Compared to prior TTE in 2017, the LVEF appears low normal  with EF 50-55%. RV systolic function is mildly depressed. Otherwise no  significant changes.  EKG:  EKG most recently performed on December 25th 2000 21 revealed baseline artifact.  Inability to determine rhythm, and ventricular pacing.  Most likely atrial sensing with ventricular pacing.  Recent Labs: 09/15/2020: B Natriuretic Peptide 976.5 09/17/2020: ALT 53; Hemoglobin 8.5; Magnesium 1.9; Platelets 333 09/18/2020: BUN 22; Creatinine, Ser 0.89; Potassium 3.6; Sodium 139  Recent Lipid Panel    Component Value Date/Time   CHOL 142 11/25/2019 1302   TRIG 83 11/25/2019 1302   TRIG 85 01/20/2016 0958   HDL 54 11/25/2019 1302   HDL 69 01/20/2016 0958   CHOLHDL 2.6 11/25/2019 1302   CHOLHDL 3.3 04/27/2016 1133   VLDL 16 04/27/2016 1133   LDLCALC 72 11/25/2019 1302   LDLCALC 99 05/21/2014 1101    Physical Exam:    VS:  BP 118/60 (BP Location: Left Arm, Patient Position: Sitting, Cuff Size: Normal)   Pulse 72   Ht 6' (1.829 m)   Wt 165 lb (74.8 kg)   SpO2 98%   BMI 22.38 kg/m     Wt  Readings from Last 3 Encounters:  10/16/20 165 lb (74.8 kg)  10/02/20 168 lb 11.2 oz (76.5 kg)  09/18/20 176 lb 2.4 oz (79.9 kg)     GEN: Elderly with frailty.  Decreased muscle mass.. No acute distress HEENT: Normal NECK: No JVD. LYMPHATICS: No lymphadenopathy CARDIAC: No murmur.  Irregular RR no gallop, or edema. VASCULAR:  Normal Pulses. No bruits. RESPIRATORY: High-pitched cellophane-like rales both lung fields from the base to the mid region without wheezing. ABDOMEN: Soft, non-tender, non-distended, No pulsatile mass, MUSCULOSKELETAL: No deformity  SKIN: Warm and dry NEUROLOGIC:  Alert and oriented x 3 PSYCHIATRIC:  Normal affect   ASSESSMENT:    1. Coronary artery disease of native artery of native heart with stable angina pectoris ([place])   2. AV block, Mobitz 2   3. Chronic diastolic heart failure ([place])   4. Essential hypertension   5. Cardiac pacemaker in situ   6. Educated about COVID-19 virus infection    PLAN:    In order of problems listed above:  1. Discussed secondary prevention.  Instructed on the use of nitroglycerin if needed.  No changes in therapy made. 2. Normally functioning pacemaker.  Rhythm on EKG done on December 25 is difficult to discern but likely represents atrial tracking with ventricular pacing. 3. No evidence of volume overload on today's exam.  He is currently on spironolactone 25 mg/day and furosemide 20 mg daily.  Furosemide was started to control lower extremity edema.  We need to watch that we do not get his systolic and diastolic blood pressure is too low.  Despite the increased diuretic intensity, breathing is not improved suggesting that it is related to primary lung and not heart  failure. 4. Blood pressure is relatively low for age.  No current changes recommended. 5. Normal pacemaker function. 6. Vaccinated and practicing social distancing.  Because of his diuretic regimen he needs a basic metabolic panel 3 times per year.  Next will be  done in April 2022.   Medication Adjustments/Labs and Tests Ordered: Current medicines are reviewed at length with the patient today.  Concerns regarding medicines are outlined above.  No orders of the defined types were placed in this encounter.  No orders of the defined types were placed in this encounter.   Patient Instructions  Medication Instructions:  Your physician recommends that you continue on your current medications as directed. Please refer to the Current Medication list given to you today.  *If you need a refill on your cardiac medications before your next appointment, please call your pharmacy*   Lab Work: Need BMP in April, August and December.  Please fax results to [phone].  If you have labs (blood work) drawn today and your tests are completely normal, you will receive your results only by: Marland Kitchen MyChart Message (if you have MyChart) OR . A paper copy in the mail If you have any lab test that is abnormal or we need to change your treatment, we will call you to review the results.   Testing/Procedures: None   Follow-Up: At [facility], you and your health needs are our priority.  As part of our continuing mission to provide you with exceptional heart care, we have created designated Provider Care Teams.  These Care Teams include your primary Cardiologist (physician) and Advanced Practice Providers (APPs -  Physician Assistants and Nurse Practitioners) who all work together to provide you with the care you need, when you need it.  We recommend signing up for the patient portal called "MyChart".  Sign up information is provided on this After Visit Summary.  MyChart is used to connect with patients for Virtual Visits (Telemedicine).  Patients are able to view lab/test results, encounter notes, upcoming appointments, etc.  Non-urgent messages can be sent to your provider as well.   To learn more about what you can do with MyChart, go to [email or web address].     Your next appointment:   9 month(s)  The format for your next appointment:   In Person  Provider:   You may see Sinclair Grooms, MD or one of the following Advanced Practice Providers on your designated Care Team:    Kathyrn Drown, NP    Other Instructions      Signed, Sinclair Grooms, MD  10/16/2020 1:07 PM    [place]

## 2020-10-16 ENCOUNTER — Ambulatory Visit (INDEPENDENT_AMBULATORY_CARE_PROVIDER_SITE_OTHER): Payer: Medicare Other | Admitting: Interventional Cardiology

## 2020-10-16 ENCOUNTER — Encounter: Payer: Self-pay | Admitting: Interventional Cardiology

## 2020-10-16 ENCOUNTER — Other Ambulatory Visit: Payer: Self-pay

## 2020-10-16 VITALS — BP 118/60 | HR 72 | Ht 72.0 in | Wt 165.0 lb

## 2020-10-16 DIAGNOSIS — I10 Essential (primary) hypertension: Secondary | ICD-10-CM

## 2020-10-16 DIAGNOSIS — I5032 Chronic diastolic (congestive) heart failure: Secondary | ICD-10-CM | POA: Diagnosis not present

## 2020-10-16 DIAGNOSIS — Z950 Presence of cardiac pacemaker: Secondary | ICD-10-CM

## 2020-10-16 DIAGNOSIS — I441 Atrioventricular block, second degree: Secondary | ICD-10-CM | POA: Diagnosis not present

## 2020-10-16 DIAGNOSIS — I25118 Atherosclerotic heart disease of native coronary artery with other forms of angina pectoris: Secondary | ICD-10-CM

## 2020-10-16 DIAGNOSIS — Z7189 Other specified counseling: Secondary | ICD-10-CM

## 2020-10-16 NOTE — Patient Instructions (Addendum)
Medication Instructions:  Your physician recommends that you continue on your current medications as directed. Please refer to the Current Medication list given to you today.  *If you need a refill on your cardiac medications before your next appointment, please call your pharmacy*   Lab Work: Need BMP in April, August and December.  Please fax results to [phone].  If you have labs (blood work) drawn today and your tests are completely normal, you will receive your results only by: Marland Kitchen MyChart Message (if you have MyChart) OR . A paper copy in the mail If you have any lab test that is abnormal or we need to change your treatment, we will call you to review the results.   Testing/Procedures: None   Follow-Up: At [facility], you and your health needs are our priority.  As part of our continuing mission to provide you with exceptional heart care, we have created designated Provider Care Teams.  These Care Teams include your primary Cardiologist (physician) and Advanced Practice Providers (APPs -  Physician Assistants and Nurse Practitioners) who all work together to provide you with the care you need, when you need it.  We recommend signing up for the patient portal called "MyChart".  Sign up information is provided on this After Visit Summary.  MyChart is used to connect with patients for Virtual Visits (Telemedicine).  Patients are able to view lab/test results, encounter notes, upcoming appointments, etc.  Non-urgent messages can be sent to your provider as well.   To learn more about what you can do with MyChart, go to [email or web address].    Your next appointment:   9 month(s)  The format for your next appointment:   In Person  Provider:   You may see Sinclair Grooms, MD or one of the following Advanced Practice Providers on your designated Care Team:    Kathyrn Drown, NP    Other Instructions

## 2020-10-19 ENCOUNTER — Ambulatory Visit: Payer: Medicare Other | Admitting: Nurse Practitioner

## 2020-10-20 ENCOUNTER — Non-Acute Institutional Stay: Payer: Medicare Other | Admitting: Nurse Practitioner

## 2020-10-20 ENCOUNTER — Encounter: Payer: Self-pay | Admitting: Nurse Practitioner

## 2020-10-20 ENCOUNTER — Other Ambulatory Visit: Payer: Self-pay

## 2020-10-20 DIAGNOSIS — Z515 Encounter for palliative care: Secondary | ICD-10-CM

## 2020-10-20 DIAGNOSIS — J9611 Chronic respiratory failure with hypoxia: Secondary | ICD-10-CM

## 2020-10-20 NOTE — Progress Notes (Signed)
[facility] Consult Note Telephone: [phone]  Fax: [phone]  PATIENT NAME: Joshua [name] [id number] [phone] (home) [phone] (work) DOB: October 27, 1931 MRN: [id number]  PRIMARY CARE PROVIDER:    Chevis Pretty, [place],  [place] [address] [phone]  REFERRING PROVIDER:   Gerald Dexter [phone]  RESPONSIBLE PARTY:   Extended Emergency Contact Information Primary Emergency Contact: Jasman, Murri" Address: [place], [place] [address] Johnnette Litter of [place] Phone: [phone] Work Phone: [phone] Mobile Phone: [phone] Relation: Son Secondary Emergency Contact: Niehaus,Linda Address: [place], [place] [address] Johnnette Litter of [place] Phone: [phone] Mobile Phone: [phone] Relation: Daughter  I met face to face with patient in facility. Caregiver/friend Jeneen Montgomery present at visit.   ASSESSMENT AND RECOMMENDATIONS:   Advance Care Planning: Today's visit consisted of building trust and discussions on Palliative care medicine as a specialized medical care for people living with serious illness, aimed at facilitating improved quality of life through symptoms relief, assisting with advance care planning and establishing goals of care. Patient and his caregiver expressed appreciation for education provided on Palliative care and how that differs from Hospice service.  Goal of care: Patient's goal of care is function. Patient's desires to walk again. He hopes to achieve this with physical therapy. Directives: We discussed patient's current illness and what it means in the larger context of patient's on-going co-morbidities. Natural disease trajectory and expectations at EOL were also discussed. After discussions on ramifications and implications of code status, patient verbalized desire to defer  decision about resuscitation to his son at the time of the event, saying if his heart stops or he stops breathing, his son is to make the decision whether to resuscitate him or not. Patient want to discuss this again at another time. Blank copy of MOST form left with patient, educational leaflet on MOST form also left with patient and his caregiver. Questions and concerns were addressed. Patient was encouraged to call with questions and/or concerns. My business card was provided.  Addendum 10/23/2020 ACP discussion held with son [name]. [name] report patient has a living will, which states that patient will want resuscitation but not remain in a vegetative state. Son said he would have further discussion with patient and let me know what the decision is regarding patient's code status. Son report family is thinking of changing facility for patient, will let Palliative care know if patient moves.   Symptom Management:  Seizure disorder: No report of recent seizure. Patient on Phenobarbital and Phenytoin sodium. Tolerating medication without complaints. Dysphagia: Patient on aspiration precautions, he is followed by ST. Patient report improvement in symptoms, episodes of couging and splutering during meals very few. No report of fever, chills, or increased SOB. Weakness: patient receives physical therapy 3 x times daily in the facility. Report standing up for the first time today since admission to the facility. Patient expressed optimism about chance of him walking again. He denied pain, voiced no concerns, report feeling well. He is on daily weight, weight has been stable in the last week, BMI 22.4. No evidence of fluid overload or acute illness noted. Report good appetite. Palliative care will continue to provided support to patient, family and medical team  Follow up Palliative Care Visit: Palliative care will continue to follow for goals of care clarification  and symptom management. Return in about 4-6 weeks or  prn.  Family /Caregiver/Community Supports: Patient is a resident of [facility] since 09/18/2020. Prior to moving in he lived at home. He has 2 children, his son is his HCPOA. His caregiver/friend Dorothy assist in his care, they have been friends for about 15 years.  Cognitive / Functional decline: Patient awake and alert, he is coherent and appropriate. He is totally dependent on staff for all his ADLs, able to feed self.  Unable to self transfer, ambulates with a wheelchair, no report of recent falls.   I spent 80 minutes providing this consultation, time includes time spent with patient, chart review, provider coordination, and documentation. More than 50% of the time in this consultation was spent counseling and coordinating communication.   CHIEF COMPLAINT: Initial palliative care visit  History obtained from review of EMR, discussion with facility staff, interview with patient and caregiver. Records reviewed and summarized bellow.  HISTORY OF PRESENT ILLNESS:  Joshua [name] is a 87 y.o. year old male with multiple medical problems including, chronic respiratory failure with hypoxia, chronic diastolic CHF, Dysphagia, CAD, seizure disorder ( related to intracranial bleed from AV malformation s/p craniotomy in 1980), HTN, major depressive, COPD, hx of prostate cancer treated with seed implant, history of bladder cancer. Palliative Care was asked to help address advance care planning and complex decision making.   CODE STATUS:  Full Code  PPS: 30%  HOSPICE ELIGIBILITY/DIAGNOSIS: TBD  PHYSICAL EXAM / ROS:   Current and past weights: 165.4lbs down from 173.6lbs last month, Ht 6f, BMI 22.4kg/m2 General: frail appearing, sitting in chair in his room in NAD  Cardiovascular: denied chest pain, denied palpitation, no edema  Pulmonary: no cough, no increased SOB, 94% on 2l oxygen GI:  appetite fair, denied constipation, continent of bowel GU: denies dysuria, continent of urine MSK:  no  joint and [name] abnormalities, non-ambulatory Skin: no rashes or wounds reported Neurological: Weakness, but otherwise nonfocal Psych: non -anxious affect  PAST MEDICAL HISTORY:  Past Medical History:  Diagnosis Date  . 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 Arthritis   . Bilateral lower extremity edema   . CAD (coronary artery disease) cardiologist--  dr henry smith   a. 04/27/2016 PCI with DES to RCA with 50% ostial to 60% segmental mid to distal left main, and 90% thrombus filled ostial to proximal circumflex, with distal right coronary filled by collaterals from left-to-right  . COPD with emphysema (HCC)   . Dyspnea on exertion   . ED (erectile dysfunction)   . First degree AV block   . GERD (gastroesophageal reflux disease)   . History of adenomatous polyp of colon    09-22-2008  . History of bladder cancer    s/p  resection bladder tumor 04-19-2016  non-invasive low-grade urothelial carcinoma  . History of prostate cancer urologist-  dr wrenn-- last PSA 0.02 (summer 2017)   dx 02/ 2013-- Stage T2a, Gleason 7, PSA 1.58--  s/p  radioactive prostate seed implants 01-19-2012  . History of ST elevation myocardial infarction (STEMI)    04-27-2016  . Hyperlipidemia   . Hypertension   . Macular degeneration   . Presence of permanent cardiac pacemaker   . S/P drug eluting coronary stent placement    04-27-2016   . Second degree Mobitz I AV block    occasional w/ first degree heart block  per cardiologist note by dr henry smith  .   Seizures (HCC) per pt son -- no seizure's since 1980   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  arteriorvenous cerebral malformation s/p  craniotomy 1980  . Vitamin D deficiency     SOCIAL HX:  Social History   Tobacco Use  . Smoking status: Former Smoker    Packs/day: 2.00    Years: 15.00    Pack years: 30.00    Quit date: 01/15/1973    Years since quitting: 47.7  . Smokeless tobacco:  Never Used  Substance Use Topics  . Alcohol use: Yes    Comment: occasionally   FAMILY HX:  Family History  Problem Relation Age of Onset  . Emphysema Mother        age 91 deceased  . Stroke Mother   . Emphysema Father        deceased age 61    ALLERGIES:  Allergies  Allergen Reactions  . Penicillins Rash    Has patient had a PCN reaction causing immediate rash, facial/tongue/throat swelling, SOB or lightheadedness with hypotension: ##Yes## Has patient had a PCN reaction causing severe rash involving mucus membranes or skin necrosis: No Has patient had a PCN reaction that required hospitalization No Has patient had a PCN reaction occurring within the last 10 years: No If all of the above answers are "NO", then may proceed with Cephalosporin use.   . Prednisone     "shaking and hands twitching"      PERTINENT MEDICATIONS:  Outpatient Encounter Medications as of 10/20/2020  Medication Sig  . albuterol (VENTOLIN HFA) 108 (90 Base) MCG/ACT inhaler Inhale 1-2 puffs into the lungs every 6 (six) hours as needed for wheezing or shortness of breath.  . aspirin EC 81 MG tablet Take 81 mg by mouth daily. Swallow whole.  . atorvastatin (LIPITOR) 20 MG tablet TAKE 1 TABLET DAILY  . baclofen (LIORESAL) 10 MG tablet Take 5 mg by mouth 2 (two) times daily.  . calcium carbonate (OSCAL) 1500 (600 Ca) MG TABS tablet Take 600 mg of elemental calcium by mouth daily with breakfast.  . Cholecalciferol (VITAMIN D3) 50 MCG (2000 UT) capsule Take 2,000 Units by mouth daily.   . citalopram (CELEXA) 20 MG tablet TAKE 1 TABLET DAILY  . docusate sodium (COLACE) 100 MG capsule Take 1 capsule (100 mg total) by mouth daily.  . ferrous sulfate 325 (65 FE) MG tablet Take 1 tablet (325 mg total) by mouth daily with breakfast.  . furosemide (LASIX) 20 MG tablet Take 20 mg by mouth daily.  . MAGNESIUM PO Take 1 tablet by mouth at bedtime.  . Multiple Vitamins-Minerals (PRESERVISION AREDS 2) CAPS Take 1 capsule by  mouth 2 (two) times daily.   . pantoprazole (PROTONIX) 40 MG tablet Take 1 tablet (40 mg total) by mouth daily.  . PHENobarbital (LUMINAL) 64.8 MG tablet Take 1 tablet (64.8 mg total) by mouth 2 (two) times daily.  . phenytoin (DILANTIN) 100 MG ER capsule Take 1 capsule (100 mg total) by mouth in the morning, at noon, in the evening, and at bedtime.  . polyethylene glycol (MIRALAX / GLYCOLAX) 17 g packet Take 17 g by mouth daily as needed for mild constipation or moderate constipation.  . polyvinyl alcohol (LIQUIFILM TEARS) 1.4 % ophthalmic solution Place 2 drops into both eyes daily as needed for dry eyes.   . spironolactone (ALDACTONE) 25 MG tablet Take 1 tablet (25 mg total) by mouth daily.   No facility-administered encounter medications on file as of 10/20/2020.      Thank you for the opportunity to participate in the care of Joshua [name]. The palliative care team will continue to follow. Please call our office at [phone] if we can be of additional assistance.  Jari Favre, DNP, AGPCNP-BC

## 2020-10-23 ENCOUNTER — Telehealth: Payer: Self-pay | Admitting: Nurse Practitioner

## 2020-11-12 ENCOUNTER — Other Ambulatory Visit: Payer: Self-pay | Admitting: Nurse Practitioner

## 2020-11-24 ENCOUNTER — Ambulatory Visit (INDEPENDENT_AMBULATORY_CARE_PROVIDER_SITE_OTHER): Payer: Medicare Other

## 2020-11-24 DIAGNOSIS — Z0000 Encounter for general adult medical examination without abnormal findings: Secondary | ICD-10-CM

## 2020-11-24 NOTE — Progress Notes (Signed)
MEDICARE ANNUAL WELLNESS VISIT  11/24/2020  Telephone Visit Disclaimer This Medicare AWV was conducted by telephone due to national recommendations for restrictions regarding the COVID-19 Pandemic (e.g. social distancing).  I verified, using two identifiers, that I am speaking with Joshua [name] or their authorized healthcare agent. I discussed the limitations, risks, security, and privacy concerns of performing an evaluation and management service by telephone and the potential availability of an in-person appointment in the future. The patient expressed understanding and agreed to proceed.  Location of Patient: Assisted living facility (home) Location of Provider (nurse):  [facility]  Subjective:    Joshua [name] is a 87 y.o. male patient of Chevis Pretty, [place] who had a Medicare Annual Wellness Visit today via telephone. Joshua [name] is Retired and lives in a skilled nursing facility. he has two children. he reports that he is not socially active and does interact with friends/family regularly. he is minimally physically active and enjoys spending time with family.  Patient Care Team: Chevis Pretty, FNP as PCP - General (Family Medicine) Belva Crome, MD as PCP - Cardiology (Cardiology) Gatha Mayer, MD as Consulting Physician (Gastroenterology) Gerarda Fraction, MD as Referring Physician (Ophthalmology) [name], MD as Attending Physician (Urology) Ilean China, RN as Case Manager  Advanced Directives 11/24/2020 09/16/2020 09/12/2020 06/23/2020 11/18/2019 11/15/2018 01/22/2018  Does Patient Have a Medical Advance Directive? Yes No Yes No Yes Yes Yes  Type of Paramedic of [place];Living will - [place];Living will - [place];Living will Living will [place];Living will  Does patient want to make changes to medical advance directive? No - Patient declined - No - Patient declined - - No - Patient  declined No - Patient declined  Copy of [place] in Chart? No - copy requested - No - copy requested - No - copy requested - -  Would patient like information on creating a medical advance directive? - - No - Patient declined No - Patient declined - - -  Pre-existing out of facility DNR order (yellow form or pink MOST form) - - - - - - -    Hospital Utilization Over the Past 12 Months: # of hospitalizations or ER visits: 2 # of surgeries: 0  Review of Systems    Patient reports that his overall health is worse compared to last year.  History obtained from child and the patient  Patient Reported Readings (BP, Pulse, CBG, Weight, etc) none  Pain Assessment Pain : No/denies pain     Current Medications & Allergies (verified) Allergies as of 11/24/2020      Reactions   Penicillins Rash   Has patient had a PCN reaction causing immediate rash, facial/tongue/throat swelling, SOB or lightheadedness with hypotension: ##Yes## Has patient had a PCN reaction causing severe rash involving mucus membranes or skin necrosis: No Has patient had a PCN reaction that required hospitalization No Has patient had a PCN reaction occurring within the last 10 years: No If all of the above answers are "NO", then may proceed with Cephalosporin use.   Prednisone    "shaking and hands twitching"      Medication List       Accurate as of November 24, 2020  3:18 PM. If you have any questions, ask your nurse or doctor.        albuterol 108 (90 Base) MCG/ACT inhaler Commonly known as: Ventolin HFA Inhale 1-2 puffs into the lungs every  6 (six) hours as needed for wheezing or shortness of breath.   aspirin EC 81 MG tablet Take 81 mg by mouth daily. Swallow whole.   atorvastatin 20 MG tablet Commonly known as: LIPITOR TAKE 1 TABLET DAILY   baclofen 10 MG tablet Commonly known as: LIORESAL Take 5 mg by mouth 2 (two) times daily.   calcium carbonate 1500 (600 Ca) MG Tabs  tablet Commonly known as: OSCAL Take 600 mg of elemental calcium by mouth daily with breakfast.   citalopram 20 MG tablet Commonly known as: CELEXA TAKE 1 TABLET DAILY   docusate sodium 100 MG capsule Commonly known as: COLACE Take 1 capsule (100 mg total) by mouth daily.   ferrous sulfate 325 (65 FE) MG tablet Take 1 tablet (325 mg total) by mouth daily with breakfast.   furosemide 20 MG tablet Commonly known as: LASIX Take 20 mg by mouth daily.   MAGNESIUM PO Take 1 tablet by mouth at bedtime.   pantoprazole 40 MG tablet Commonly known as: PROTONIX Take 1 tablet (40 mg total) by mouth daily.   PHENobarbital 64.8 MG tablet Commonly known as: LUMINAL Take 1 tablet (64.8 mg total) by mouth 2 (two) times daily.   phenytoin 100 MG ER capsule Commonly known as: DILANTIN Take 1 capsule (100 mg total) by mouth in the morning, at noon, in the evening, and at bedtime.   polyethylene glycol 17 g packet Commonly known as: MIRALAX / GLYCOLAX Take 17 g by mouth daily as needed for mild constipation or moderate constipation.   polyvinyl alcohol 1.4 % ophthalmic solution Commonly known as: LIQUIFILM TEARS Place 2 drops into both eyes daily as needed for dry eyes.   PreserVision AREDS 2 Caps Take 1 capsule by mouth 2 (two) times daily.   spironolactone 25 MG tablet Commonly known as: ALDACTONE Take 1 tablet (25 mg total) by mouth daily.   Vitamin D3 50 MCG (2000 UT) capsule Take 2,000 Units by mouth daily.       History (reviewed): Past Medical History:  Diagnosis Date  . 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 Arthritis   . Bilateral lower extremity edema   . CAD (coronary artery disease) cardiologist--  dr Daneen Schick   a. 04/27/2016 PCI with DES to RCA with 50% ostial to 60% segmental mid to distal left main, and 90% thrombus filled ostial to proximal circumflex, with distal right coronary filled by collaterals  from left-to-right  . COPD with emphysema ([place])   . Dyspnea on exertion   . ED (erectile dysfunction)   . First degree AV block   . GERD (gastroesophageal reflux disease)   . History of adenomatous polyp of colon    09-22-2008  . History of bladder cancer    s/p  resection bladder tumor 04-19-2016  non-invasive low-grade urothelial carcinoma  . History of prostate cancer urologist-  dr Jeffie Pollock-- last PSA 0.02 (summer 2017)   dx 02/ 2013-- Stage T2a, Gleason 7, PSA 1.58--  s/p  radioactive prostate seed implants 01-19-2012  . History of ST elevation myocardial infarction (STEMI)    04-27-2016  . Hyperlipidemia   . Hypertension   . Macular degeneration   . Presence of permanent cardiac pacemaker   . S/P drug eluting coronary stent placement    04-27-2016   . Second degree Mobitz I AV block    occasional w/ first degree heart block  per cardiologist note by dr Daneen Schick  .  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 Vitamin D deficiency    Past Surgical History:  Procedure Laterality Date  . APPENDECTOMY  2011  . BLEPHAROPLASTY Bilateral revision 02-17-2016  . CARDIAC CATHETERIZATION N/A 04/27/2016   Procedure: Left Heart Cath and Coronary Angiography;  Surgeon: Belva Crome, MD;  Location: [place] [facility];  Service: Cardiovascular;  Laterality: N/A;  acute inferior wall STEMI ;  ostCx to midCx 90%,  ost1st Mrg to 1st Mrg 50%,  mid to distal RCA 100%,  pRCA 80%,  LVEF 45-50% w/ inferobasal akinesis  . CARDIAC CATHETERIZATION N/A 04/27/2016   Procedure: Temporary Pacemaker;  Surgeon: Belva Crome, MD;  Location: [place] [facility];  Service: Cardiovascular;  Laterality: N/A;  mobitz 2  second degree HB w/ heart rate 29bpm  . CARDIAC CATHETERIZATION N/A 04/27/2016   Procedure: Coronary Stent Intervention;  Surgeon: Belva Crome, MD;  Location: [place] [facility];   Service: Cardiovascular;  Laterality: N/A; DES x1 to  Proximal RCA;  DES x1 to Mid RCA  . CATARACT EXTRACTION W/ INTRAOCULAR LENS  IMPLANT, BILATERAL  2016  approx.  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 CYSTOSCOPY  01/19/2012   Procedure: CYSTOSCOPY;  Surgeon: Malka So, MD;  Location[facility];  Service: Urology;;  no seeds found in bladder  . CYSTOSCOPY W/ RETROGRADES Bilateral 04/19/2016   Procedure: CYSTOSCOPY WITH BILATERAL RETROGRADE PYELOGRAM TRANSURETHRAL RESECTION OF BLADDER TUMOR ;  Surgeon: [name], MD;  Location: [facility];  Service: Urology;  Laterality: Bilateral;  . CYSTOSCOPY WITH BIOPSY N/A 11/10/2016   Procedure: CYSTOSCOPY WITH BIOPSY AND FULGURATION;  Surgeon: [name], MD;  Location: [facility];  Service: Urology;  Laterality: N/A;  . ENTEROSCOPY N/A 09/16/2020   Procedure: ENTEROSCOPY;  Surgeon: Jackquline Denmark, MD;  Location: [facility];  Service: Endoscopy;  Laterality: N/A;  . GIVENS CAPSULE STUDY N/A 09/16/2020   Procedure: GIVENS CAPSULE STUDY;  Surgeon: Jackquline Denmark, MD;  Location: [facility];  Service: Endoscopy;  Laterality: N/A;  . HOT HEMOSTASIS N/A 09/16/2020   Procedure: HOT HEMOSTASIS (ARGON PLASMA COAGULATION/BICAP);  Surgeon: Jackquline Denmark, MD;  Location: [facility] ENDOSCOPY;  Service: Endoscopy;  Laterality: N/A;  . INSERT / REPLACE / REMOVE PACEMAKER  06/15/2017  . LEAD EXTRACTION N/A 01/22/2018   Procedure: LEAD EXTRACTION;  Surgeon: Evans Lance, MD;  Location: [place] [facility];  Service: Cardiovascular;  Laterality: N/A;  . PACEMAKER IMPLANT N/A 06/15/2017   Procedure: Pacemaker Implant;  Surgeon: [name], MD;  Location: [place] [facility];  Service: Cardiovascular;  Laterality: N/A;  . PACEMAKER IMPLANT N/A 01/25/2018   Procedure: PACEMAKER IMPLANT;  Surgeon: Evans Lance, MD;  Location: [place] [facility];  Service: Cardiovascular;  Laterality: N/A;  . PPM  GENERATOR REMOVAL N/A 01/22/2018   Procedure: PPM GENERATOR REMOVAL;  Surgeon: Evans Lance, MD;  Location: [place] [facility];  Service: Cardiovascular;  Laterality: N/A;  . RADIOACTIVE SEED IMPLANT  01/19/2012   Procedure: RADIOACTIVE SEED IMPLANT;  Surgeon: Malka So, MD;  Location[facility];  Service: Urology;  Laterality: N/A;  68 seeds implanted  . SUBMUCOSAL TATTOO INJECTION  09/16/2020   Procedure: SUBMUCOSAL TATTOO INJECTION;  Surgeon: Jackquline Denmark, MD;  Location: [facility] ENDOSCOPY;  Service: Endoscopy;;  .  TRANSTHORACIC ECHOCARDIOGRAM  06/02/2016   ef 60-65%/  trivial MR/  mild TR   Family History  Problem Relation Age of Onset  . Emphysema Mother        age 75 deceased  . Stroke Mother   . Emphysema Father        deceased age 72   Social History   Socioeconomic History  . Marital status: Widowed    Spouse name: Not on file  . Number of children: 2  . Years of education: 1 year of college  . Highest education level: Not on file  Occupational History  . Occupation: Retired  Tobacco Use  . Smoking status: Former Smoker    Packs/day: 2.00    Years: 15.00    Pack years: 30.00    Quit date: 01/15/1973    Years since quitting: 47.8  . Smokeless tobacco: Never Used  Vaping Use  . Vaping Use: Never used  Substance and Sexual Activity  . Alcohol use: Yes    Comment: occasionally  . Drug use: No  . Sexual activity: Not Currently  Other Topics Concern  . Not on file  Social History Narrative   Lives at home alone. Has contact with son daily and has dinner with him weekly. [place]    Social Determinants of Health   Financial Resource Strain: Not on file  Food Insecurity: Not on file  Transportation Needs: Not on file  Physical Activity: Not on file  Stress: Not on file  Social Connections: Not on file    Activities of Daily Living In your present state of health, do you have any difficulty performing the following  activities: 11/24/2020 09/12/2020  Hearing? Y N  Vision? N N  Difficulty concentrating or making decisions? Y N  Walking or climbing stairs? Y Y  Dressing or bathing? Y N  Doing errands, shopping? Y N  Preparing Food and eating ? Y -  Using the Toilet? N -  In the past six months, have you accidently leaked urine? N -  Do you have problems with loss of bowel control? N -  Managing your Medications? Y -  Managing your Finances? Y -  Housekeeping or managing your Housekeeping? Y -  Some recent data might be hidden   Patient is living in an assisted living/nursing home [facility]) now.  Because of his lung disease he is now dependent on someone to help him with the activities above.  Patient Education/ Literacy How often do you need to have someone help you when you read instructions, pamphlets, or other written materials from your doctor or pharmacy?: 1 - Never What is the last grade level you completed in school?: 1 year of college  Exercise Current Exercise Habits: The patient does not participate in regular exercise at present, Exercise limited by: cardiac condition(s);respiratory conditions(s)  Diet Patient reports consuming 3 meals a day and 1 snack(s) a day Patient reports that his primary diet is: Regular Patient reports that he does have regular access to food.   Depression Screen PHQ 2/9 Scores 07/17/2020 11/25/2019 11/18/2019 07/19/2019 11/15/2018 10/02/2018 05/10/2018  PHQ - 2 Score 0 0 0 0 0 0 0     Fall Risk Fall Risk  11/24/2020 07/17/2020 11/25/2019 11/18/2019 07/19/2019  Falls in the past year? 1 1 1 1 1   Number falls in past yr: 1 0 1 1 1   Injury with Fall? 0 0 0 0 0  Comment - - - - -  Risk Factor Category  - - - - -  Risk for fall due to : History of fall(s);Impaired balance/gait;Impaired mobility History of fall(s);Impaired balance/gait History of fall(s) History of fall(s) -  Follow up Falls evaluation completed Falls evaluation completed Education provided  Falls prevention discussed -     Objective:  Joshua [name] seemed alert and oriented and he participated appropriately during our telephone visit.  Blood Pressure Weight BMI  BP Readings from Last 3 Encounters:  10/16/20 118/60  10/02/20 106/60  09/18/20 (!) 122/56   Wt Readings from Last 3 Encounters:  10/16/20 165 lb (74.8 kg)  10/02/20 168 lb 11.2 oz (76.5 kg)  09/18/20 176 lb 2.4 oz (79.9 kg)   BMI Readings from Last 1 Encounters:  10/16/20 22.38 kg/m    *Unable to obtain current vital signs, weight, and BMI due to telephone visit type  Hearing/Vision  . Thomson did not seem to have difficulty with hearing/understanding during the telephone conversation . Reports that he has had a formal eye exam by an eye care professional within the past year . Reports that he has not had a formal hearing evaluation within the past year *Unable to fully assess hearing and vision during telephone visit type  Cognitive Function: 6CIT Screen 11/24/2020 11/18/2019  What Year? 0 points 0 points  What month? 0 points 0 points  What time? 0 points 0 points  Count back from 20 0 points 0 points  Months in reverse 0 points 0 points  Repeat phrase 0 points 0 points  Total Score 0 0   (Normal:0-7, Significant for Dysfunction: >8)  Normal Cognitive Function Screening: Yes   Immunization & Health Maintenance Record Immunization History  Administered Date(s) Administered  . Fluad Quad(high Dose 65+) 07/01/2019, 07/28/2020  . Influenza, High Dose Seasonal PF 07/08/2016, 08/18/2017, 08/29/2018  . Influenza,inj,Quad PF,6+ Mos 07/08/2013, 09/10/2014, 08/26/2015  . Moderna Sars-Covid-2 Vaccination 10/12/2019, 11/09/2019, 07/28/2020  . Pneumococcal Conjugate-13 07/08/2013  . Pneumococcal Polysaccharide-23 08/18/2017  . Td 12/11/2000, 08/31/2011  . Tdap 08/31/2011    Health Maintenance  Topic Date Due  . COVID-19 Vaccine (4 - Booster for Moderna series) 01/25/2021  . TETANUS/TDAP  08/30/2021  .  INFLUENZA VACCINE  Completed  . PNA vac Low Risk Adult  Completed  . HPV VACCINES  Aged Out       Assessment  This is a routine wellness examination for [employer].  Health Maintenance: Due or Overdue There are no preventive care reminders to display for this patient.  Joshua [name] does not need a referral for Community Assistance: Care Management:   no Social Work:    no Prescription Assistance:  no Nutrition/Diabetes Education:  no   Plan:  Personalized Goals Goals Addressed            This Visit's Progress   . Patient Stated       11/24/2020 AWV Goal: Fall Prevention  . Over the next year, patient will decrease their risk for falls by: o Using assistive devices, such as a cane or walker, as needed o Identifying fall risks within their home and correcting them by: - Removing throw rugs - Adding handrails to stairs or ramps - Removing clutter and keeping a clear pathway throughout the home - Increasing light, especially at night - Adding shower handles/bars - Raising toilet seat o Identifying potential personal risk factors for falls: - Medication side effects - Incontinence/urgency - Vestibular dysfunction - Hearing loss - Musculoskeletal disorders - Neurological disorders - Orthostatic hypotension  Personalized Health Maintenance & Screening Recommendations  up to date  Lung Cancer Screening Recommended: no (Low Dose CT Chest recommended if Age 98-80 years, 30 pack-year currently smoking OR have quit w/in past 15 years) Hepatitis C Screening recommended: no HIV Screening recommended: no  Advanced Directives: Written information was not prepared per patient's request.  Referrals & Orders No orders of the defined types were placed in this encounter.   Follow-up Plan . Follow-up with Chevis Pretty, FNP as planned    I have personally reviewed and noted the following in the patient's chart:   . Medical and social history . Use of  alcohol, tobacco or illicit drugs  . Current medications and supplements . Functional ability and status . Nutritional status . Physical activity . Advanced directives . List of other physicians . Hospitalizations, surgeries, and ER visits in previous 12 months . Vitals . Screenings to include cognitive, depression, and falls . Referrals and appointments  In addition, I have reviewed and discussed with Joshua [name] certain preventive protocols, quality metrics, and best practice recommendations. A written personalized care plan for preventive services as well as general preventive health recommendations is available and can be mailed to the patient at his request.      Felicity Coyer, LPN    02/24/3418  Patient declined after visit summary.

## 2020-12-09 ENCOUNTER — Ambulatory Visit (INDEPENDENT_AMBULATORY_CARE_PROVIDER_SITE_OTHER): Payer: Medicare Other

## 2020-12-09 DIAGNOSIS — I441 Atrioventricular block, second degree: Secondary | ICD-10-CM

## 2020-12-11 LAB — CUP PACEART REMOTE DEVICE CHECK
Battery Remaining Longevity: 86 mo
Battery Voltage: 3 V
Brady Statistic AP VP Percent: 24.19 %
Brady Statistic AP VS Percent: 3.33 %
Brady Statistic AS VP Percent: 38.53 %
Brady Statistic AS VS Percent: 33.96 %
Brady Statistic RA Percent Paced: 39.42 %
Brady Statistic RV Percent Paced: 62.65 %
Date Time Interrogation Session: 20220324223422
Implantable Lead Implant Date: 20190509
Implantable Lead Implant Date: 20190509
Implantable Lead Location: 753859
Implantable Lead Location: 753860
Implantable Lead Model: 3830
Implantable Lead Model: 5076
Implantable Pulse Generator Implant Date: 20190509
Lead Channel Impedance Value: 228 Ohm
Lead Channel Impedance Value: 304 Ohm
Lead Channel Impedance Value: 437 Ohm
Lead Channel Impedance Value: 475 Ohm
Lead Channel Pacing Threshold Amplitude: 1.125 V
Lead Channel Pacing Threshold Amplitude: 1.5 V
Lead Channel Pacing Threshold Pulse Width: 0.4 ms
Lead Channel Pacing Threshold Pulse Width: 0.4 ms
Lead Channel Sensing Intrinsic Amplitude: 0.25 mV
Lead Channel Sensing Intrinsic Amplitude: 0.25 mV
Lead Channel Sensing Intrinsic Amplitude: 1.375 mV
Lead Channel Sensing Intrinsic Amplitude: 1.375 mV
Lead Channel Setting Pacing Amplitude: 2.25 V
Lead Channel Setting Pacing Amplitude: 3 V
Lead Channel Setting Pacing Pulse Width: 0.4 ms
Lead Channel Setting Sensing Sensitivity: 0.9 mV

## 2020-12-16 ENCOUNTER — Other Ambulatory Visit: Payer: Self-pay | Admitting: Nurse Practitioner

## 2020-12-16 NOTE — Telephone Encounter (Signed)
MMM NTBS last OV & lipids 11/25/19 mail order not sent

## 2020-12-21 NOTE — Progress Notes (Signed)
Remote pacemaker transmission.   

## 2021-01-11 ENCOUNTER — Non-Acute Institutional Stay: Payer: Medicare Other | Admitting: Nurse Practitioner

## 2021-01-11 ENCOUNTER — Other Ambulatory Visit: Payer: Self-pay

## 2021-01-11 DIAGNOSIS — R634 Abnormal weight loss: Secondary | ICD-10-CM

## 2021-01-11 DIAGNOSIS — Z515 Encounter for palliative care: Secondary | ICD-10-CM

## 2021-01-11 NOTE — Progress Notes (Signed)
[facility] Consult Note Telephone: [phone]  Fax: [phone]    Date of encounter: 01/11/21 PATIENT NAME: Joshua [name] [address] [place] [address]   [phone] (home) [phone] (work) DOB: 1932-07-22 MRN: [id number] PRIMARY CARE PROVIDER:    Chevis [name], [place],  [place] [address] [phone]  REFERRING PROVIDER:   Chevis [name], [place],   [phone]  RESPONSIBLE PARTY:    Contact Information    Name Relation Home Work Mobile   Joshua, [name] MacKenzie" [place] [phone]   Joshua, [name] Daughter [phone]  [phone]     I met face to face with patient infacility. Palliative Care was asked to follow this patient by consultation request of  Joshua Done, [name], * to address advance care planning and complex medical decision making. This is a follow up visit.                                ASSESSMENT AND PLAN / RECOMMENDATIONS:   Advance Care Planning/Goals of Care: Goals include to maximize quality of life and symptom management. Our advance care planning conversation included a discussion about:     The value and importance of advance care planning   Exploration of goals of care in the event of a sudden injury or illness   Review and updating or creation of an  advance directive document .  Decision not to resuscitate   Goal of care: Goal of care is function, to be able to walk independently. Directive: Patient verbalized desire to not be resuscitated in the event of cardiac or respiratory arrest. He verbalized that he has led a good life and had no regrets, saying he is at peace with his decision. Validation provided. Questions and concerns were addressed. Patient was encouraged to call with questions and/or concerns. Provided general support and encouragement. DNR form signed for patient today, copy  uploaded to [facility] EMR. Form given to nursing supervisor Joy, RN for placement on patient's file. Code Status: DNR  I spent 48 minutes providing this consultation. More than 50% of the time in this consultation was spent in counseling and care coordination.  ------------------------------------------------------------------------------  Symptom Management/Plan: Weight loss: Continue to encourage healthy meal choices from a variety of food groups. Encourage snacking between meals. Continue weights per facility protocol. Consider dietary supplement (medpass/Ensure) and or appetite stimulant such as Remeron 7.35m QHS or Megace if weight loss persists. Generalized weakness: patient continues to require total assist with his ADLs, [facility], non- [facility]ulatory. He is able to feed self. Patient has transition to self pay for skilled care, he was discharged from therapy due to poor progression, now on restorative therapy. Maintain skin integrity, fall precautions per facility protocol. Continue to offer assistance as needed, encourage to do as much as possible for self. Visit update discussed with patient's son Joshua [name]. Patient reiterated his desire to not be resuscitated to the hearing of his son over the phone, son verbalized understanding of patient's wishes. Joshua [name] wants facility to update him on patient's medication changes or any new medical order. Patient's nurse Joshua [name] and facility provider Joshua [name] aware of family's request.  Follow up Palliative Care Visit: Palliative care will continue to follow for complex medical decision making, advance care planning, and clarification of goals. Return in 6-8 weeks or prn.  PPS: 30%  HOSPICE ELIGIBILITY/DIAGNOSIS: TBD  CHIEF COMPLAIN:  Weight loss  History obtained from review of Epic EMR, discussion with facility staff, and interview with Joshua [name].   (onHISTORY OF PRESENT ILLNESS:  Joshua [name] is a 87 y.o. 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 Patient notes with about 20lbs weight loss in the last 2 months, weight loss in the context of poor oral intake, which staff report is improving since discontinuation of Baclofen. No associated symptoms like nausea or vomiting. Patient denied fever, denied chills, denied increased SOB. Ten systems reviewed and are negative for acute change, except as noted in the HPI.   Reviewed lab 09/18/2020 Na 139, K 3.6, Albumin 2.9, Cr 0.89, GFR >60, Hgb 8.5, Hct 26.6 Reviewed report of CT on 09/14/2020  IMPRESSION: 1. The appearance of the lungs is compatible with interstitial lung disease, with a spectrum of findings considered probable usual interstitial pneumonia (UIP) per current ATS guidelines. Repeat high-resolution chest CT is recommended in 12 months to assess for temporal changes in the appearance of the lung parenchyma. 2. Diffuse bronchial wall thickening with mild centrilobular and paraseptal emphysema; imaging findings suggestive of underlying COPD. 3. Aortic atherosclerosis, in addition to left main and 3 vessel coronary artery disease. 4. There are calcifications of the aortic valve and mitral annulus. Echocardiographic correlation for evaluation of potential valvular dysfunction may be warranted if clinically indicated. 5. Mild cardiomegaly  I reviewed available labs, medications, imaging, studies and related documents from the EMR.  Records reviewed and summarized above.   Physical Exam: Current and past weights: 146.8lbs down from 165.4lbs two months ago Constitutional: NAD General: frail appearing, thin, sitting in recliner in NAD  EYES:  anicteric sclera, no discharge  ENMT: intact hearing, oral mucous membranes moist CV: S1S2 normal, LE edema Pulmonary: LCTA, no increased work of breathing, no cough, room air Abdomen: no ascites GU: deferred MSK: severe sarcopenia, non-ambulatory Skin: warm and dry, no rashes or wounds on visible skin Neuro: generalized weakness, no cognitive impairment Psych: non-anxious affect, A and O x 3 Hem/lymph/immuno: no widespread bruising  Thank you for the opportunity to participate in the care of Mr. Long.  The palliative care team will continue to follow. Please call our office at [phone] if we can be of additional assistance.   Jari Favre, DNP,AGPCNP-BC  COVID-19 PATIENT SCREENING TOOL Asked and negative response unless otherwise noted:   Have you had symptoms of covid, tested positive or been in contact with someone with symptoms/positive test in the past 5-10 days?

## 2021-02-04 ENCOUNTER — Ambulatory Visit: Payer: Medicare Other | Admitting: Neurology

## 2021-02-11 ENCOUNTER — Telehealth: Payer: Self-pay | Admitting: *Deleted

## 2021-02-11 ENCOUNTER — Encounter: Payer: Self-pay | Admitting: *Deleted

## 2021-02-11 DIAGNOSIS — I5032 Chronic diastolic (congestive) heart failure: Secondary | ICD-10-CM

## 2021-02-11 NOTE — Progress Notes (Signed)
This encounter was created in error - please disregard.

## 2021-02-11 NOTE — Telephone Encounter (Signed)
Left message to call back to schedule lab appt.

## 2021-02-11 NOTE — Telephone Encounter (Signed)
Son returned call and asked if there is any way that the lab could be drawn at the facility pt is at [facility]).  Advised I will reach out to see if that is possible.  Spoke with Joy,nurse at [facility].  Pt is still currently taking Spironolactone.  She will check to see if they have drawn a BMET in April or May and send it over.  If not, she will have it drawn there at the facility and sent over.  Fax number provided.

## 2021-02-11 NOTE — Telephone Encounter (Signed)
-----   Message from Loren Racer, RN sent at 10/16/2020  9:34 AM EST ----- Regarding: BMET in April BMET in April for [place] use

## 2021-03-01 NOTE — Telephone Encounter (Signed)
Spoke with Joy to inquire about request for labs.  She states last BMET was in March.  Advised we will need another one in July because we need them every 4 months with him taking Spironolactone.  She took our fax number and states she will have them drawn in early July.

## 2021-03-10 ENCOUNTER — Ambulatory Visit (INDEPENDENT_AMBULATORY_CARE_PROVIDER_SITE_OTHER): Payer: Medicare Other

## 2021-03-10 DIAGNOSIS — I441 Atrioventricular block, second degree: Secondary | ICD-10-CM

## 2021-03-10 LAB — CUP PACEART REMOTE DEVICE CHECK
Battery Remaining Longevity: 87 mo
Battery Voltage: 2.99 V
Brady Statistic AP VP Percent: 34.22 %
Brady Statistic AP VS Percent: 3.02 %
Brady Statistic AS VP Percent: 51.21 %
Brady Statistic AS VS Percent: 11.55 %
Brady Statistic RA Percent Paced: 40.49 %
Brady Statistic RV Percent Paced: 84.84 %
Date Time Interrogation Session: 20220621215548
Implantable Lead Implant Date: 20190509
Implantable Lead Implant Date: 20190509
Implantable Lead Location: 753859
Implantable Lead Location: 753860
Implantable Lead Model: 3830
Implantable Lead Model: 5076
Implantable Pulse Generator Implant Date: 20190509
Lead Channel Impedance Value: 247 Ohm
Lead Channel Impedance Value: 323 Ohm
Lead Channel Impedance Value: 475 Ohm
Lead Channel Impedance Value: 494 Ohm
Lead Channel Pacing Threshold Amplitude: 0.875 V
Lead Channel Pacing Threshold Amplitude: 1.5 V
Lead Channel Pacing Threshold Pulse Width: 0.4 ms
Lead Channel Pacing Threshold Pulse Width: 0.4 ms
Lead Channel Sensing Intrinsic Amplitude: 0.5 mV
Lead Channel Sensing Intrinsic Amplitude: 0.5 mV
Lead Channel Sensing Intrinsic Amplitude: 3.125 mV
Lead Channel Sensing Intrinsic Amplitude: 3.125 mV
Lead Channel Setting Pacing Amplitude: 2 V
Lead Channel Setting Pacing Amplitude: 3 V
Lead Channel Setting Pacing Pulse Width: 0.4 ms
Lead Channel Setting Sensing Sensitivity: 0.9 mV

## 2021-03-30 NOTE — Progress Notes (Signed)
Remote pacemaker transmission.   

## 2021-04-02 NOTE — Telephone Encounter (Signed)
Called [facility] [employer] [facility].  Left message to call back.

## 2021-04-06 NOTE — Telephone Encounter (Signed)
Spoke with Joshua [name] and she states they drew a BMET on 7/1 and 7/5.  Provided fax number.  She will fax those over to [place].

## 2021-04-13 NOTE — Progress Notes (Signed)
Electrophysiology Office Note Date: 04/14/2021  ID:  Joshua [name], DOB [birth year], MRN [id number]  PCP: Chevis Pretty, FNP Primary Cardiologist: Sinclair Grooms, MD Electrophysiologist: Cristopher Peru, MD (explanted and re-implanted; Previously Dr. Rayann Heman implanted)  CC: Pacemaker follow-up  Joshua [name] is a 87 y.o. male seen today for Cristopher Peru, MD for routine electrophysiology followup.  Since last being seen in our clinic the patient reports doing well overall. He is at a facility and not very active.  he denies chest pain, palpitations, dyspnea, PND, orthopnea, nausea, vomiting, dizziness, syncope, edema, weight gain, or early satiety.  Device History: MDT dual chamber PPM implanted 2018 for mobitz II; extraction and re-implantation 2019  Past Medical History:  Diagnosis Date   Arteriovenous malformation    caused intracerebrial bleed w seizure -- post craniotomy 1980   Arthritis    Bilateral lower extremity edema    CAD (coronary artery disease) cardiologist--  dr Daneen Schick   a. 04/27/2016 PCI with DES to RCA with 50% ostial to 60% segmental mid to distal left main, and 90% thrombus filled ostial to proximal circumflex, with distal right coronary filled by collaterals from left-to-right   COPD with emphysema (HCC)    Dyspnea on exertion    ED (erectile dysfunction)    First degree AV block    GERD (gastroesophageal reflux disease)    History of adenomatous polyp of colon    09-22-2008   History of bladder cancer    s/p  resection bladder tumor 04-19-2016  non-invasive low-grade urothelial carcinoma   History of prostate cancer urologist-  dr Jeffie Pollock-- last PSA 0.02 (summer 2017)   dx 02/ 2013-- Stage T2a, Gleason 7, PSA 1.58--  s/p  radioactive prostate seed implants 01-19-2012   History of ST elevation myocardial infarction (STEMI)    04-27-2016   Hyperlipidemia    Hypertension    Macular degeneration    Presence of permanent cardiac pacemaker    S/P drug  eluting coronary stent placement    04-27-2016    Second degree Mobitz I AV block    occasional w/ first degree heart block  per cardiologist note by dr Daneen Schick   Seizures [facility]) per pt son -- no seizure's since 1980   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  arteriorvenous cerebral malformation s/p  craniotomy 1980   Vitamin D deficiency    Past Surgical History:  Procedure Laterality Date   APPENDECTOMY  2011   BLEPHAROPLASTY Bilateral revision 02-17-2016   CARDIAC CATHETERIZATION N/A 04/27/2016   Procedure: Left Heart Cath and Coronary Angiography;  Surgeon: Belva Crome, MD;  Location: [place] [facility];  Service: Cardiovascular;  Laterality: N/A;  acute inferior wall STEMI ;  ostCx to midCx 90%,  ost1st Mrg to 1st Mrg 50%,  mid to distal RCA 100%,  pRCA 80%,  LVEF 45-50% w/ inferobasal akinesis   CARDIAC CATHETERIZATION N/A 04/27/2016   Procedure: Temporary Pacemaker;  Surgeon: Belva Crome, MD;  Location: [place] [facility];  Service: Cardiovascular;  Laterality: N/A;  mobitz 2  second degree HB w/ heart rate 29bpm   CARDIAC CATHETERIZATION N/A 04/27/2016   Procedure: Coronary Stent Intervention;  Surgeon: Belva Crome, MD;  Location: [place] [facility];  Service: Cardiovascular;  Laterality: N/A; DES x1 to  Proximal RCA;  DES x1 to Mid RCA   CATARACT EXTRACTION W/ INTRAOCULAR LENS  IMPLANT, BILATERAL  2016  approx.   CRANIOTOMY  1980   intracranial bleed  from arteriorvenous malformation (left parietal)   CYSTOSCOPY  01/19/2012   Procedure: CYSTOSCOPY;  Surgeon: Malka So, MD;  Location: [facility];  Service: Urology;;  no seeds found in bladder   CYSTOSCOPY W/ RETROGRADES Bilateral 04/19/2016   Procedure: CYSTOSCOPY WITH BILATERAL RETROGRADE PYELOGRAM TRANSURETHRAL RESECTION OF BLADDER TUMOR ;  Surgeon: [name], MD;  Location: [facility];  Service: Urology;  Laterality: Bilateral;   CYSTOSCOPY WITH BIOPSY N/A 11/10/2016   Procedure: CYSTOSCOPY WITH BIOPSY AND  FULGURATION;  Surgeon: [name], MD;  Location: [facility];  Service: Urology;  Laterality: N/A;   ENTEROSCOPY N/A 09/16/2020   Procedure: ENTEROSCOPY;  Surgeon: Jackquline Denmark, MD;  Location: [facility]OSCOPY;  Service: Endoscopy;  Laterality: N/A;   GIVENS CAPSULE STUDY N/A 09/16/2020   Procedure: GIVENS CAPSULE STUDY;  Surgeon: Jackquline Denmark, MD;  Location: [facility]OSCOPY;  Service: Endoscopy;  Laterality: N/A;   HOT HEMOSTASIS N/A 09/16/2020   Procedure: HOT HEMOSTASIS (ARGON PLASMA COAGULATION/BICAP);  Surgeon: Jackquline Denmark, MD;  Location: [facility]OSCOPY;  Service: Endoscopy;  Laterality: N/A;   INSERT / REPLACE / REMOVE PACEMAKER  06/15/2017   LEAD EXTRACTION N/A 01/22/2018   Procedure: LEAD EXTRACTION;  Surgeon: Evans Lance, MD;  Location: [place] [facility];  Service: Cardiovascular;  Laterality: N/A;   PACEMAKER IMPLANT N/A 06/15/2017   Procedure: Pacemaker Implant;  Surgeon: [name], MD;  Location: [place] [facility];  Service: Cardiovascular;  Laterality: N/A;   PACEMAKER IMPLANT N/A 01/25/2018   Procedure: PACEMAKER IMPLANT;  Surgeon: Evans Lance, MD;  Location: [place] [facility];  Service: Cardiovascular;  Laterality: N/A;   PPM GENERATOR REMOVAL N/A 01/22/2018   Procedure: PPM GENERATOR REMOVAL;  Surgeon: Evans Lance, MD;  Location: [place] [facility];  Service: Cardiovascular;  Laterality: N/A;   RADIOACTIVE SEED IMPLANT  01/19/2012   Procedure: RADIOACTIVE SEED IMPLANT;  Surgeon: Malka So, MD;  Location: [facility];  Service: Urology;  Laterality: N/A;  68 seeds implanted   SUBMUCOSAL TATTOO INJECTION  09/16/2020   Procedure: SUBMUCOSAL TATTOO INJECTION;  Surgeon: Jackquline Denmark, MD;  Location: [facility] END[facility]COPY;  Service: Endoscopy;;   TRANSTHORACIC ECHOCARDIOGRAM  06/02/2016   ef 60-65%/  trivial MR/  mild TR    Current Outpatient Medications  Medication Sig Dispense Refill   albuterol (VENTOLIN HFA) 108 (90 Base) MCG/ACT inhaler Inhale  1-2 puffs into the lungs every 6 (six) hours as needed for wheezing or shortness of breath. 18 g 0   aspirin EC 81 MG tablet Take 81 mg by mouth daily. Swallow whole.     atorvastatin (LIPITOR) 20 MG tablet TAKE 1 TABLET DAILY 90 tablet 0   baclofen (LIORESAL) 10 MG tablet Take 5 mg by mouth 2 (two) times daily.     calcium carbonate (OSCAL) 1500 (600 Ca) MG TABS tablet Take 600 mg of elemental calcium by mouth daily with breakfast.     Cholecalciferol (VITAMIN D3) 50 MCG (2000 UT) capsule Take 2,000 Units by mouth daily.      citalopram (CELEXA) 20 MG tablet TAKE 1 TABLET DAILY 90 tablet 0   docusate sodium (COLACE) 100 MG capsule Take 1 capsule (100 mg total) by mouth daily. 30 capsule 0   ferrous sulfate 325 (65 FE) MG tablet Take 1 tablet (325 mg total) by mouth daily with breakfast.  3   furosemide (LASIX) 20 MG tablet Take 20 mg by mouth daily.     MAGNESIUM PO Take 1  tablet by mouth at bedtime.     Multiple Vitamins-Minerals (PRESERVISION AREDS 2) CAPS Take 1 capsule by mouth 2 (two) times daily.      pantoprazole (PROTONIX) 40 MG tablet Take 1 tablet (40 mg total) by mouth daily. 30 tablet 0   PHENobarbital (LUMINAL) 64.8 MG tablet Take 1 tablet (64.8 mg total) by mouth 2 (two) times daily. 180 tablet 0   phenytoin (DILANTIN) 100 MG ER capsule Take 1 capsule (100 mg total) by mouth in the morning, at noon, in the evening, and at bedtime. 120 capsule 1   polyethylene glycol (MIRALAX / GLYCOLAX) 17 g packet Take 17 g by mouth daily as needed for mild constipation or moderate constipation. 14 each 0   polyvinyl alcohol (LIQUIFILM TEARS) 1.4 % ophthalmic solution Place 2 drops into both eyes daily as needed for dry eyes.      spironolactone (ALDACTONE) 25 MG tablet Take 1 tablet (25 mg total) by mouth daily. 30 tablet 0   No current facility-administered medications for this visit.    Allergies:   Penicillins and Prednisone   Social History: Social History   Socioeconomic History    Marital status: Widowed    Spouse name: Not on file   Number of children: 2   Years of education: 1 year of college   Highest education level: Not on file  Occupational History   Occupation: Retired  Tobacco Use   Smoking status: Former    Packs/day: 2.00    Years: 15.00    Pack years: 30.00    Types: Cigarettes    Quit date: 01/15/1973    Years since quitting: 48.2   Smokeless tobacco: Never  Vaping Use   Vaping Use: Never used  Substance and Sexual Activity   Alcohol use: Yes    Comment: occasionally   Drug use: No   Sexual activity: Not Currently  Other Topics Concern   Not on file  Social History Narrative   Lives at home alone. Has contact with son daily and has dinner with him weekly. [place]    Social Determinants of Health   Financial Resource Strain: Not on file  Food Insecurity: Not on file  Transportation Needs: Not on file  Physical Activity: Not on file  Stress: Not on file  Social Connections: Not on file  Intimate Partner Violence: Not on file    Family History: Family History  Problem Relation Age of Onset   Emphysema Mother        age 42 deceased   Stroke Mother    Emphysema Father        deceased age 31     Review of Systems: All other systems reviewed and are otherwise negative except as noted above.  Physical Exam: Vitals:   04/14/21 1207  BP: 124/60  Pulse: 60  SpO2: 99%  Height: 6' (1.829 m)     GEN- The patient is well appearing, alert and oriented x 3 today.   HEENT: normocephalic, atraumatic; sclera clear, conjunctiva pink; hearing intact; oropharynx clear; neck supple  Lungs- Clear to ausculation bilaterally, normal work of breathing.  No wheezes, rales, rhonchi Heart- Regular rate and rhythm, no murmurs, rubs or gallops  GI- soft, non-tender, non-distended, bowel sounds present  Extremities- no clubbing or cyanosis. No edema MS- no significant deformity or atrophy Skin- warm and dry, no rash or  lesion; PPM pocket well healed Psych- euthymic mood, full affect Neuro- strength and sensation are intact  PPM  Interrogation- reviewed in detail today,  See PACEART report  EKG:  EKG is not ordered today.  Recent Labs: 09/15/2020: B Natriuretic Peptide 976.5 09/17/2020: ALT 53; Hemoglobin 8.5; Magnesium 1.9; Platelets 333 09/18/2020: BUN 22; Creatinine, Ser 0.89; Potassium 3.6; Sodium 139   Wt Readings from Last 3 Encounters:  10/16/20 165 lb (74.8 kg)  10/02/20 168 lb 11.2 oz (76.5 kg)  09/18/20 176 lb 2.4 oz (79.9 kg)     Other studies Reviewed: Additional studies/ records that were reviewed today include: Previous EP office notes, Previous remote checks, Most recent labwork.   Assessment and Plan:  Intermittent CHB s/p [employer] PPM  Normal PPM function See Pace Art report No changes today  2. Chronic diastolic CHF Volume status stable on exam.  3. Paroxysmal atrial arrhythmias  Overall very low burden with 200 short episodes / 0.1% of surveillance periods since 08/2019 - 04/14/2020. Review of remotes has shown brief AT and AF at times.  CHA2DS2/VASc would be at least 4 given age, HTN, and CAD Discussed with patient and son at length. Given his relative immobility and h/o AVMs, will plan to continue monitoring his SCAF and not plan on anticoagulating unless burden of AF increases.  Current medicines are reviewed at length with the patient today.   The patient does not have concerns regarding his medicines.  The following changes were made today:  none  Labs/ tests ordered today include:  No orders of the defined types were placed in this encounter.  Disposition:   Follow up with Dr. Lovena Le in 12 Months   Signed, Annamaria Helling  04/14/2021 1:36 PM  [place] [address] [place] [address] [phone] (office) [phone] (fax)

## 2021-04-14 ENCOUNTER — Other Ambulatory Visit: Payer: Self-pay

## 2021-04-14 ENCOUNTER — Ambulatory Visit (INDEPENDENT_AMBULATORY_CARE_PROVIDER_SITE_OTHER): Payer: Medicare Other | Admitting: Student

## 2021-04-14 ENCOUNTER — Encounter: Payer: Self-pay | Admitting: Student

## 2021-04-14 VITALS — BP 124/60 | HR 60 | Ht 72.0 in

## 2021-04-14 DIAGNOSIS — I25118 Atherosclerotic heart disease of native coronary artery with other forms of angina pectoris: Secondary | ICD-10-CM | POA: Diagnosis not present

## 2021-04-14 DIAGNOSIS — I441 Atrioventricular block, second degree: Secondary | ICD-10-CM

## 2021-04-14 DIAGNOSIS — I5032 Chronic diastolic (congestive) heart failure: Secondary | ICD-10-CM | POA: Diagnosis not present

## 2021-04-14 LAB — CUP PACEART INCLINIC DEVICE CHECK
Battery Remaining Longevity: 73 mo
Battery Voltage: 2.99 V
Brady Statistic AP VP Percent: 45.07 %
Brady Statistic AP VS Percent: 0.73 %
Brady Statistic AS VP Percent: 48.41 %
Brady Statistic AS VS Percent: 5.79 %
Brady Statistic RA Percent Paced: 47.62 %
Brady Statistic RV Percent Paced: 93.39 %
Date Time Interrogation Session: 20220727132619
Implantable Lead Implant Date: 20190509
Implantable Lead Implant Date: 20190509
Implantable Lead Location: 753859
Implantable Lead Location: 753860
Implantable Lead Model: 3830
Implantable Lead Model: 5076
Implantable Pulse Generator Implant Date: 20190509
Lead Channel Impedance Value: 247 Ohm
Lead Channel Impedance Value: 342 Ohm
Lead Channel Impedance Value: 475 Ohm
Lead Channel Impedance Value: 494 Ohm
Lead Channel Pacing Threshold Amplitude: 0.875 V
Lead Channel Pacing Threshold Amplitude: 1.5 V
Lead Channel Pacing Threshold Pulse Width: 0.4 ms
Lead Channel Pacing Threshold Pulse Width: 0.4 ms
Lead Channel Sensing Intrinsic Amplitude: 0.25 mV
Lead Channel Sensing Intrinsic Amplitude: 0.375 mV
Lead Channel Sensing Intrinsic Amplitude: 2.375 mV
Lead Channel Sensing Intrinsic Amplitude: 3 mV
Lead Channel Setting Pacing Amplitude: 2 V
Lead Channel Setting Pacing Amplitude: 3 V
Lead Channel Setting Pacing Pulse Width: 0.6 ms
Lead Channel Setting Sensing Sensitivity: 0.9 mV

## 2021-04-14 NOTE — Patient Instructions (Signed)
Medication Instructions:  Your physician recommends that you continue on your current medications as directed. Please refer to the Current Medication list given to you today.  *If you need a refill on your cardiac medications before your next appointment, please call your pharmacy*   Lab Work: None If you have labs (blood work) drawn today and your tests are completely normal, you will receive your results only by: MyChart Message (if you have MyChart) OR A paper copy in the mail If you have any lab test that is abnormal or we need to change your treatment, we will call you to review the results.   Follow-Up: At [facility], you and your health needs are our priority.  As part of our continuing mission to provide you with exceptional heart care, we have created designated Provider Care Teams.  These Care Teams include your primary Cardiologist (physician) and Advanced Practice Providers (APPs -  Physician Assistants and Nurse Practitioners) who all work together to provide you with the care you need, when you need it.  Your next appointment:   1 year(s)  The format for your next appointment:   In Person  Provider:   You may see Gregg Taylor, MD or one of the following Advanced Practice Providers on your designated Care Team:   Renee Ursuy, PA-C Michael "Andy" Tillery, PA-C   

## 2021-04-14 NOTE — Telephone Encounter (Signed)
Spoke with nurse at [facility] and advised we have not received labs.  Provided fax number again.  She said she will send labs over.

## 2021-04-28 ENCOUNTER — Ambulatory Visit: Payer: Medicare Other | Admitting: Neurology

## 2021-05-06 ENCOUNTER — Ambulatory Visit: Payer: Medicare Other | Admitting: Neurology

## 2021-05-06 ENCOUNTER — Encounter: Payer: Self-pay | Admitting: Neurology

## 2021-05-11 ENCOUNTER — Ambulatory Visit: Payer: Medicare Other | Admitting: Neurology

## 2021-06-02 ENCOUNTER — Other Ambulatory Visit: Payer: Self-pay

## 2021-06-02 ENCOUNTER — Ambulatory Visit (INDEPENDENT_AMBULATORY_CARE_PROVIDER_SITE_OTHER): Payer: Medicare Other | Admitting: Interventional Cardiology

## 2021-06-02 ENCOUNTER — Encounter: Payer: Self-pay | Admitting: Interventional Cardiology

## 2021-06-02 VITALS — BP 122/58 | HR 83 | Ht 72.0 in | Wt 165.0 lb

## 2021-06-02 DIAGNOSIS — I10 Essential (primary) hypertension: Secondary | ICD-10-CM

## 2021-06-02 DIAGNOSIS — Z950 Presence of cardiac pacemaker: Secondary | ICD-10-CM | POA: Diagnosis not present

## 2021-06-02 DIAGNOSIS — I441 Atrioventricular block, second degree: Secondary | ICD-10-CM

## 2021-06-02 DIAGNOSIS — I25118 Atherosclerotic heart disease of native coronary artery with other forms of angina pectoris: Secondary | ICD-10-CM

## 2021-06-02 NOTE — Progress Notes (Signed)
Cardiology Office Note:    Date:  06/02/2021   ID:  [name], DOB 1932-03-09, MRN [id number]  PCP:  Chevis Pretty, FNP  Cardiologist:  Sinclair Grooms, MD   Referring MD: Chevis Pretty, *   Chief Complaint  Patient presents with   Atrial Fibrillation   Coronary Artery Disease   Congestive Heart Failure    History of Present Illness:    Joshua [name] is a 87 y.o. male with a hx of sz disorder (intracerebral bleed from AV malformation), prostate CA treated with seed implant, bladder CA,  HTN, HLD, GERD, STEMI 05/10/2016 treated with DES to RCA, AV block requiring PPM 2018 and replacement 2019 due to infection. Prior acute diastolic heart failure resolved with stent.   Joshua [name] is here today with a representative from the facility where Joshua [name] resides.  Joshua [name] denies any chest pain.  Joshua [name] has chronic shortness of breath.  No orthopnea.  States Joshua [name] does not get rehab or physical therapy at his facility.  Joshua [name] uses an arm crank and exercise ergometer for his legs and arms.  With activity Joshua [name] is not having angina.  Joshua [name] denies excessive shortness of breath.  Joshua [name] is able to lay flat without shortness of breath.  Joshua [name] has had recurring pneumonia due to aspiration.  This is a routine 1 year follow-up.  Past Medical History:  Diagnosis Date   Arteriovenous malformation    caused intracerebrial bleed w seizure -- post craniotomy 1980   Arthritis    Bilateral lower extremity edema    CAD (coronary artery disease) cardiologist--  dr Daneen Schick   a. 04/27/2016 PCI with DES to RCA with 50% ostial to 60% segmental mid to distal left main, and 90% thrombus filled ostial to proximal circumflex, with distal right coronary filled by collaterals from left-to-right   COPD with emphysema (HCC)    Dyspnea on exertion    ED (erectile dysfunction)    First degree AV block    GERD (gastroesophageal reflux disease)    History of adenomatous polyp of colon    09-22-2008   History of bladder cancer     s/p  resection bladder tumor 04-19-2016  non-invasive low-grade urothelial carcinoma   History of prostate cancer urologist-  dr Jeffie Pollock-- last PSA 0.02 (summer 2017)   dx 02/ 2013-- Stage T2a, Gleason 7, PSA 1.58--  s/p  radioactive prostate seed implants 01-19-2012   History of ST elevation myocardial infarction (STEMI)    04-27-2016   Hyperlipidemia    Hypertension    Macular degeneration    Presence of permanent cardiac pacemaker    S/P drug eluting coronary stent placement    04-27-2016    Second degree Mobitz I AV block    occasional w/ first degree heart block  per cardiologist note by dr Daneen Schick   Seizures [facility]) per pt son -- no seizure's since 1980   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  arteriorvenous cerebral malformation s/p  craniotomy 1980   Vitamin D deficiency     Past Surgical History:  Procedure Laterality Date   APPENDECTOMY  2011   BLEPHAROPLASTY Bilateral revision 02-17-2016   CARDIAC CATHETERIZATION N/A 04/27/2016   Procedure: Left Heart Cath and Coronary Angiography;  Surgeon: Belva Crome, MD;  Location: [place] [facility];  Service: Cardiovascular;  Laterality: N/A;  acute inferior wall STEMI ;  ostCx to midCx 90%,  ost1st Mrg to 1st Mrg 50%,  mid to distal RCA 100%,  pRCA  80%,  LVEF 45-50% w/ inferobasal akinesis   CARDIAC CATHETERIZATION N/A 04/27/2016   Procedure: Temporary Pacemaker;  Surgeon: Belva Crome, MD;  Location: [place] [facility];  Service: Cardiovascular;  Laterality: N/A;  mobitz 2  second degree HB w/ heart rate 29bpm   CARDIAC CATHETERIZATION N/A 04/27/2016   Procedure: Coronary Stent Intervention;  Surgeon: Belva Crome, MD;  Location: [place] [facility];  Service: Cardiovascular;  Laterality: N/A; DES x1 to  Proximal RCA;  DES x1 to Mid RCA   CATARACT EXTRACTION W/ INTRAOCULAR LENS  IMPLANT, BILATERAL  2016  approx.   CRANIOTOMY  1980   intracranial bleed from arteriorvenous malformation (left parietal)   CYSTOSCOPY  01/19/2012    Procedure: CYSTOSCOPY;  Surgeon: Malka So, MD;  Location: [facility];  Service: Urology;;  no seeds found in bladder   CYSTOSCOPY W/ RETROGRADES Bilateral 04/19/2016   Procedure: CYSTOSCOPY WITH BILATERAL RETROGRADE PYELOGRAM TRANSURETHRAL RESECTION OF BLADDER TUMOR ;  Surgeon: [name], MD;  Location: [facility];  Service: Urology;  Laterality: Bilateral;   CYSTOSCOPY WITH BIOPSY N/A 11/10/2016   Procedure: CYSTOSCOPY WITH BIOPSY AND FULGURATION;  Surgeon: [name], MD;  Location: [facility];  Service: Urology;  Laterality: N/A;   ENTEROSCOPY N/A 09/16/2020   Procedure: ENTEROSCOPY;  Surgeon: Jackquline Denmark, MD;  Location: [facility];  Service: Endoscopy;  Laterality: N/A;   GIVENS CAPSULE STUDY N/A 09/16/2020   Procedure: GIVENS CAPSULE STUDY;  Surgeon: Jackquline Denmark, MD;  Location: [facility];  Service: Endoscopy;  Laterality: N/A;   HOT HEMOSTASIS N/A 09/16/2020   Procedure: HOT HEMOSTASIS (ARGON PLASMA COAGULATION/BICAP);  Surgeon: Jackquline Denmark, MD;  Location: [facility];  Service: Endoscopy;  Laterality: N/A;   INSERT / REPLACE / REMOVE PACEMAKER  06/15/2017   LEAD EXTRACTION N/A 01/22/2018   Procedure: LEAD EXTRACTION;  Surgeon: Evans Lance, MD;  Location: [place] [facility];  Service: Cardiovascular;  Laterality: N/A;   PACEMAKER IMPLANT N/A 06/15/2017   Procedure: Pacemaker Implant;  Surgeon: [name], MD;  Location: [place] [facility];  Service: Cardiovascular;  Laterality: N/A;   PACEMAKER IMPLANT N/A 01/25/2018   Procedure: PACEMAKER IMPLANT;  Surgeon: Evans Lance, MD;  Location: [place] [facility];  Service: Cardiovascular;  Laterality: N/A;   PPM GENERATOR REMOVAL N/A 01/22/2018   Procedure: PPM GENERATOR REMOVAL;  Surgeon: Evans Lance, MD;  Location: [place] [facility];  Service: Cardiovascular;  Laterality: N/A;   RADIOACTIVE SEED IMPLANT  01/19/2012   Procedure: RADIOACTIVE SEED IMPLANT;  Surgeon: Malka So, MD;  Location: [facility];  Service: Urology;  Laterality: N/A;  68 seeds implanted   SUBMUCOSAL TATTOO INJECTION  09/16/2020   Procedure: SUBMUCOSAL TATTOO INJECTION;  Surgeon: Jackquline Denmark, MD;  Location: [facility] END[facility];  Service: Endoscopy;;   TRANSTHORACIC ECHOCARDIOGRAM  06/02/2016   ef 60-65%/  trivial MR/  mild TR    Current Medications: Current Meds  Medication Sig   albuterol (VENTOLIN HFA) 108 (90 Base) MCG/ACT inhaler Inhale 1-2 puffs into the lungs every 6 (six) hours as needed for wheezing or shortness of breath.   aspirin EC 81 MG tablet Take 81 mg by mouth daily. Swallow whole.   atorvastatin (LIPITOR) 20 MG tablet TAKE 1 TABLET DAILY   baclofen (LIORESAL) 10 MG tablet Take 5 mg by mouth 2 (two) times daily.   calcium carbonate (OSCAL) 1500 (600 Ca) MG TABS tablet Take 600 mg of elemental calcium by mouth  daily with breakfast.   Cholecalciferol (VITAMIN D3) 50 MCG (2000 UT) capsule Take 2,000 Units by mouth daily.    citalopram (CELEXA) 20 MG tablet TAKE 1 TABLET DAILY   docusate sodium (COLACE) 100 MG capsule Take 1 capsule (100 mg total) by mouth daily. (Patient taking differently: Take 200 mg by mouth daily.)   ferrous sulfate 325 (65 FE) MG tablet Take 1 tablet (325 mg total) by mouth daily with breakfast.   furosemide (LASIX) 20 MG tablet Take 20 mg by mouth daily.   levofloxacin (LEVAQUIN) 500 MG tablet Take 500 mg by mouth 2 (two) times daily.   MAGNESIUM PO Take 1 tablet by mouth at bedtime.   Multiple Vitamins-Minerals (PRESERVISION AREDS 2) CAPS Take 1 capsule by mouth 2 (two) times daily.    pantoprazole (PROTONIX) 40 MG tablet Take 1 tablet (40 mg total) by mouth daily.   PHENobarbital (LUMINAL) 64.8 MG tablet Take 1 tablet (64.8 mg total) by mouth 2 (two) times daily.   phenytoin (DILANTIN) 100 MG ER capsule Take 1 capsule (100 mg total) by mouth in the morning, at noon, in the evening, and at bedtime.   polyethylene glycol (MIRALAX / GLYCOLAX) 17 g packet Take 17 g by  mouth daily as needed for mild constipation or moderate constipation.   polyvinyl alcohol (LIQUIFILM TEARS) 1.4 % ophthalmic solution Place 2 drops into both eyes daily as needed for dry eyes.    spironolactone (ALDACTONE) 25 MG tablet Take 1 tablet (25 mg total) by mouth daily.     Allergies:   Penicillins and Prednisone   Social History   Socioeconomic History   Marital status: Widowed    Spouse name: Not on file   Number of children: 2   Years of education: 1 year of college   Highest education level: Not on file  Occupational History   Occupation: Retired  Tobacco Use   Smoking status: Former    Packs/day: 2.00    Years: 15.00    Pack years: 30.00    Types: Cigarettes    Quit date: 01/15/1973    Years since quitting: 48.4   Smokeless tobacco: Never  Vaping Use   Vaping Use: Never used  Substance and Sexual Activity   Alcohol use: Yes    Comment: occasionally   Drug use: No   Sexual activity: Not Currently  Other Topics Concern   Not on file  Social History Narrative   Lives at home alone. Has contact with son daily and has dinner with him weekly. [place]    Social Determinants of Health   Financial Resource Strain: Not on file  Food Insecurity: Not on file  Transportation Needs: Not on file  Physical Activity: Not on file  Stress: Not on file  Social Connections: Not on file     Family History: The patient's family history includes Emphysema in his father and mother; Stroke in his mother.  ROS:   Please see the history of present illness.    Joshua [name] appears frail.  Decreased energy.  Laments about having recurring pneumonia.  All other systems reviewed and are negative.  EKGs/Labs/Other Studies Reviewed:    The following studies were reviewed today: No new data is available or recently performed.  EKG:  EKG ventricular pacing.  Occasional normal rhythm with occasional ventricular pacing, first-degree AV block, PACs,  Recent  Labs: 09/15/2020: B Natriuretic Peptide 976.5 09/17/2020: ALT 53; Hemoglobin 8.5; Magnesium 1.9; Platelets 333 09/18/2020: BUN 22; Creatinine, Ser  0.89; Potassium 3.6; Sodium 139  Recent Lipid Panel    Component Value Date/Time   CHOL 142 11/25/2019 1302   TRIG 83 11/25/2019 1302   TRIG 85 01/20/2016 0958   HDL 54 11/25/2019 1302   HDL 69 01/20/2016 0958   CHOLHDL 2.6 11/25/2019 1302   CHOLHDL 3.3 04/27/2016 1133   VLDL 16 04/27/2016 1133   LDLCALC 72 11/25/2019 1302   LDLCALC 99 05/21/2014 1101    Physical Exam:    VS:  BP (!) 122/58   Pulse 83   Ht 6' (1.829 m)   Wt 165 lb (74.8 kg)   SpO2 100%   BMI 22.38 kg/m     Wt Readings from Last 3 Encounters:  06/02/21 165 lb (74.8 kg)  10/16/20 165 lb (74.8 kg)  10/02/20 168 lb 11.2 oz (76.5 kg)     GEN: Elderly and frail. No acute distress HEENT: Normal NECK: No JVD. LYMPHATICS: No lymphadenopathy CARDIAC: 3/6 apical systolic mitral regurgitation murmur. RRR no gallop, or edema. VASCULAR:  Normal Pulses. No bruits. RESPIRATORY:  Clear to auscultation without rales, wheezing or rhonchi  ABDOMEN: Soft, non-tender, non-distended, No pulsatile mass, MUSCULOSKELETAL: No deformity  SKIN: Warm and dry NEUROLOGIC:  Alert and oriented x 3 PSYCHIATRIC:  Normal affect   ASSESSMENT:    1. Coronary artery disease of native artery of native heart with stable angina pectoris ([place])   2. Essential hypertension   3. AV block, Mobitz 2   4. Cardiac pacemaker in situ    PLAN:    In order of problems listed above:  Brief discussion of secondary prevention.  Continue statin therapy.  LDL target less than 70.  Most recent LDL was 72.  This was performed 18 months ago.  A more recent study is not available for me to review.  Continue aspirin once per day. Blood pressure control is adequate continue Aldactone 25 mg daily, furosemide 20 mg daily, and low-salt diet. Permanent pacemaker functioning normally.  Cognitively, Joshua [name] appears to  be doing relatively well.  Physically, Joshua [name] is losing ground.  No no alarming cardiovascular symptoms.   Medication Adjustments/Labs and Tests Ordered: Current medicines are reviewed at length with the patient today.  Concerns regarding medicines are outlined above.  No orders of the defined types were placed in this encounter.  No orders of the defined types were placed in this encounter.   Patient Instructions  Medication Instructions:  Your physician recommends that you continue on your current medications as directed. Please refer to the Current Medication list given to you today.  *If you need a refill on your cardiac medications before your next appointment, please call your pharmacy*   Lab Work: None If you have labs (blood work) drawn today and your tests are completely normal, you will receive your results only by: [place] (if you have MyChart) OR A paper copy in the mail If you have any lab test that is abnormal or we need to change your treatment, we will call you to review the results.   Testing/Procedures: None   Follow-Up: At [facility], you and your health needs are our priority.  As part of our continuing mission to provide you with exceptional heart care, we have created designated Provider Care Teams.  These Care Teams include your primary Cardiologist (physician) and Advanced Practice Providers (APPs -  Physician Assistants and Nurse Practitioners) who all work together to provide you with the care you need, when you need it.  We recommend  signing up for the patient portal called "MyChart".  Sign up information is provided on this After Visit Summary.  MyChart is used to connect with patients for Virtual Visits (Telemedicine).  Patients are able to view lab/test results, encounter notes, upcoming appointments, etc.  Non-urgent messages can be sent to your provider as well.   To learn more about what you can do with MyChart, go to [email or web address].     Your next appointment:   1 year(s)  The format for your next appointment:   In Person  Provider:   You may see Sinclair Grooms, MD or one of the following Advanced Practice Providers on your designated Care Team:   Cecilie Kicks, NP   Other Instructions     Signed, Sinclair Grooms, MD  06/02/2021 5:32 PM    [place]

## 2021-06-02 NOTE — Patient Instructions (Signed)

## 2021-06-08 NOTE — Addendum Note (Signed)
Addended byDanielle Dess on: 06/08/2021 08:20 AM   Modules accepted: Orders

## 2021-06-30 ENCOUNTER — Ambulatory Visit: Payer: Medicare Other | Admitting: Pulmonary Disease

## 2021-07-20 DEATH — deceased
